# Patient Record
Sex: Male | Born: 1960 | Race: Black or African American | Hispanic: No | Marital: Single | Smoking: Former smoker
Health system: Southern US, Community
[De-identification: ages and names within clinical notes are randomized; demographics above are authoritative.]

## PROBLEM LIST (undated history)

## (undated) DIAGNOSIS — R112 Nausea with vomiting, unspecified: Secondary | ICD-10-CM

## (undated) DIAGNOSIS — IMO0001 Reserved for inherently not codable concepts without codable children: Secondary | ICD-10-CM

## (undated) DIAGNOSIS — I10 Essential (primary) hypertension: Secondary | ICD-10-CM

## (undated) DIAGNOSIS — K219 Gastro-esophageal reflux disease without esophagitis: Secondary | ICD-10-CM

## (undated) DIAGNOSIS — R569 Unspecified convulsions: Secondary | ICD-10-CM

## (undated) DIAGNOSIS — Z923 Personal history of irradiation: Secondary | ICD-10-CM

## (undated) DIAGNOSIS — R55 Syncope and collapse: Secondary | ICD-10-CM

## (undated) DIAGNOSIS — G4700 Insomnia, unspecified: Secondary | ICD-10-CM

## (undated) DIAGNOSIS — R5383 Other fatigue: Secondary | ICD-10-CM

## (undated) DIAGNOSIS — R12 Heartburn: Secondary | ICD-10-CM

## (undated) DIAGNOSIS — C140 Malignant neoplasm of pharynx, unspecified: Secondary | ICD-10-CM

## (undated) HISTORY — PX: TONSILLECTOMY AND ADENOIDECTOMY: SHX28

## (undated) HISTORY — PX: HERNIA REPAIR: SHX51

## (undated) HISTORY — DX: Heartburn: R12

## (undated) HISTORY — DX: Insomnia, unspecified: G47.00

## (undated) HISTORY — DX: Other fatigue: R53.83

## (undated) HISTORY — PX: TONSILLECTOMY: SUR1361

## (undated) HISTORY — DX: Syncope and collapse: R55

## (undated) HISTORY — DX: Malignant neoplasm of pharynx, unspecified: C14.0

## (undated) HISTORY — DX: Nausea with vomiting, unspecified: R11.2

---

## 1996-12-12 HISTORY — PX: KNEE SURGERY: SHX244

## 1999-01-07 ENCOUNTER — Encounter: Payer: Self-pay | Admitting: Emergency Medicine

## 1999-01-07 ENCOUNTER — Emergency Department (HOSPITAL_COMMUNITY): Admission: EM | Admit: 1999-01-07 | Discharge: 1999-01-07 | Payer: Self-pay | Admitting: Emergency Medicine

## 2009-05-27 ENCOUNTER — Ambulatory Visit: Payer: Self-pay | Admitting: Internal Medicine

## 2009-06-11 ENCOUNTER — Ambulatory Visit: Payer: Self-pay | Admitting: Internal Medicine

## 2009-06-30 ENCOUNTER — Ambulatory Visit: Payer: Self-pay | Admitting: Family Medicine

## 2009-07-06 ENCOUNTER — Ambulatory Visit: Payer: Self-pay | Admitting: Internal Medicine

## 2009-08-06 ENCOUNTER — Ambulatory Visit: Payer: Self-pay | Admitting: Internal Medicine

## 2009-09-01 ENCOUNTER — Ambulatory Visit: Payer: Self-pay | Admitting: Internal Medicine

## 2009-09-09 ENCOUNTER — Ambulatory Visit: Payer: Self-pay | Admitting: Internal Medicine

## 2009-12-16 ENCOUNTER — Ambulatory Visit: Payer: Self-pay | Admitting: Internal Medicine

## 2009-12-16 LAB — CONVERTED CEMR LAB
Albumin ELP: 54.8 % — ABNORMAL LOW (ref 55.8–66.1)
Alpha-1-Globulin: 4.3 % (ref 2.9–4.9)
Alpha-2-Globulin: 10.7 % (ref 7.1–11.8)
Anti Nuclear Antibody(ANA): NEGATIVE
Basophils Absolute: 0.1 10*3/uL (ref 0.0–0.1)
Basophils Relative: 1 % (ref 0–1)
Beta Globulin: 6.4 % (ref 4.7–7.2)
Eosinophils Absolute: 0.3 10*3/uL (ref 0.0–0.7)
Eosinophils Relative: 3 % (ref 0–5)
HCT: 41.7 % (ref 39.0–52.0)
HDL: 59 mg/dL (ref >39)
Hemoglobin: 14.1 g/dL (ref 13.0–17.0)
LDL Cholesterol: 68 mg/dL (ref 0–99)
Lymphocytes Relative: 20 % (ref 12–46)
MCHC: 33.8 g/dL (ref 30.0–36.0)
Monocytes Absolute: 0.8 10*3/uL (ref 0.1–1.0)
Monocytes Relative: 8 % (ref 3–12)
Platelets: 226 10*3/uL (ref 150–400)
RDW: 14.4 % (ref 11.5–15.5)
Sed Rate: 9 mm/hr (ref 0–16)
Total Protein, Serum Electrophoresis: 7.3 g/dL (ref 6.0–8.3)

## 2009-12-18 ENCOUNTER — Ambulatory Visit (HOSPITAL_COMMUNITY): Admission: RE | Admit: 2009-12-18 | Discharge: 2009-12-18 | Payer: Self-pay | Admitting: Internal Medicine

## 2009-12-29 ENCOUNTER — Ambulatory Visit: Payer: Self-pay | Admitting: Internal Medicine

## 2010-01-28 ENCOUNTER — Encounter (INDEPENDENT_AMBULATORY_CARE_PROVIDER_SITE_OTHER): Payer: Self-pay | Admitting: Family Medicine

## 2010-01-28 ENCOUNTER — Ambulatory Visit: Payer: Self-pay | Admitting: Internal Medicine

## 2010-06-01 ENCOUNTER — Ambulatory Visit: Payer: Self-pay | Admitting: Internal Medicine

## 2010-06-01 ENCOUNTER — Encounter (INDEPENDENT_AMBULATORY_CARE_PROVIDER_SITE_OTHER): Payer: Self-pay | Admitting: Family Medicine

## 2010-06-01 LAB — CONVERTED CEMR LAB
ALT: 8 units/L (ref 0–53)
AST: 15 units/L (ref 0–37)
Albumin: 4.5 g/dL (ref 3.5–5.2)
Alkaline Phosphatase: 48 units/L (ref 39–117)
BUN: 7 mg/dL (ref 6–23)
Basophils Absolute: 0.1 10*3/uL (ref 0.0–0.1)
Basophils Relative: 1 % (ref 0–1)
CO2: 25 meq/L (ref 19–32)
Calcium: 9.9 mg/dL (ref 8.4–10.5)
Chloride: 104 meq/L (ref 96–112)
Cholesterol: 145 mg/dL (ref 0–200)
Creatinine, Ser: 1.07 mg/dL (ref 0.40–1.50)
Eosinophils Absolute: 0.4 10*3/uL (ref 0.0–0.7)
Eosinophils Relative: 5 % (ref 0–5)
Glucose, Bld: 89 mg/dL (ref 70–99)
HCT: 42.7 % (ref 39.0–52.0)
HDL: 65 mg/dL (ref >39)
Hemoglobin: 14.3 g/dL (ref 13.0–17.0)
LDL Cholesterol: 59 mg/dL (ref 0–99)
Lymphocytes Relative: 23 % (ref 12–46)
Lymphs Abs: 1.8 10*3/uL (ref 0.7–4.0)
MCHC: 33.5 g/dL (ref 30.0–36.0)
MCV: 101.7 fL — ABNORMAL HIGH (ref 78.0–100.0)
Magnesium: 1.9 mg/dL (ref 1.5–2.5)
Monocytes Absolute: 0.8 10*3/uL (ref 0.1–1.0)
Monocytes Relative: 10 % (ref 3–12)
Neutro Abs: 4.8 10*3/uL (ref 1.7–7.7)
Neutrophils Relative %: 61 % (ref 43–77)
Platelets: 210 10*3/uL (ref 150–400)
Potassium: 4.9 meq/L (ref 3.5–5.3)
RBC: 4.2 M/uL — ABNORMAL LOW (ref 4.22–5.81)
RDW: 15.3 % (ref 11.5–15.5)
Sodium: 143 meq/L (ref 135–145)
TSH: 1.996 microintl units/mL (ref 0.350–4.500)
Total Bilirubin: 0.3 mg/dL (ref 0.3–1.2)
Total CHOL/HDL Ratio: 2.2
Total Protein: 7.8 g/dL (ref 6.0–8.3)
Triglycerides: 107 mg/dL (ref <150)
VLDL: 21 mg/dL (ref 0–40)
Vitamin B-12: >2000 pg/mL — ABNORMAL HIGH (ref 211–911)
WBC: 7.9 10*3/uL (ref 4.0–10.5)

## 2010-07-02 ENCOUNTER — Ambulatory Visit: Payer: Self-pay | Admitting: Internal Medicine

## 2010-07-09 ENCOUNTER — Ambulatory Visit (HOSPITAL_COMMUNITY): Admission: RE | Admit: 2010-07-09 | Discharge: 2010-07-09 | Payer: Self-pay | Admitting: Family Medicine

## 2011-01-02 ENCOUNTER — Encounter: Payer: Self-pay | Admitting: Family Medicine

## 2011-01-02 ENCOUNTER — Encounter: Payer: Self-pay | Admitting: Internal Medicine

## 2011-08-19 ENCOUNTER — Emergency Department (HOSPITAL_COMMUNITY)
Admission: EM | Admit: 2011-08-19 | Discharge: 2011-08-19 | Disposition: A | Discharge status: Home / Self Care | Payer: Self-pay | Attending: Emergency Medicine | Admitting: Emergency Medicine

## 2011-08-19 DIAGNOSIS — S91009A Unspecified open wound, unspecified ankle, initial encounter: Secondary | ICD-10-CM | POA: Insufficient documentation

## 2011-08-19 DIAGNOSIS — W1809XA Striking against other object with subsequent fall, initial encounter: Secondary | ICD-10-CM | POA: Insufficient documentation

## 2011-08-19 DIAGNOSIS — Y92009 Unspecified place in unspecified non-institutional (private) residence as the place of occurrence of the external cause: Secondary | ICD-10-CM | POA: Insufficient documentation

## 2011-08-19 DIAGNOSIS — L708 Other acne: Secondary | ICD-10-CM | POA: Insufficient documentation

## 2011-08-19 DIAGNOSIS — L723 Sebaceous cyst: Secondary | ICD-10-CM | POA: Insufficient documentation

## 2011-08-19 DIAGNOSIS — S81009A Unspecified open wound, unspecified knee, initial encounter: Secondary | ICD-10-CM | POA: Insufficient documentation

## 2014-07-21 ENCOUNTER — Emergency Department (INDEPENDENT_AMBULATORY_CARE_PROVIDER_SITE_OTHER)
Admission: EM | Admit: 2014-07-21 | Discharge: 2014-07-21 | Disposition: A | Discharge status: Home / Self Care | Payer: Self-pay | Source: Home / Self Care | Attending: Family Medicine | Admitting: Family Medicine

## 2014-07-21 ENCOUNTER — Encounter (HOSPITAL_COMMUNITY): Payer: Self-pay | Admitting: Emergency Medicine

## 2014-07-21 DIAGNOSIS — J029 Acute pharyngitis, unspecified: Secondary | ICD-10-CM

## 2014-07-21 NOTE — ED Provider Notes (Signed)
Medical screening examination/treatment/procedure(s) were performed by a resident physician or non-physician practitioner and as the supervising physician I was immediately available for consultation/collaboration.  Linna Darner, MD Family Medicine   Waldemar Dickens, MD 07/21/14 2138

## 2014-07-21 NOTE — Discharge Instructions (Signed)
As we discussed, I have concern that you may need special xrays of your neck to determine if you have a mass in your throat. If you wish to have these xrays done today, you will need to present yourself to be seen at the [facility], or [facility] facility. Otherwise, you can follow up with your primary care doctor to arrange for them to be performed.  Sore Throat A sore throat is pain, burning, irritation, or scratchiness of the throat. There is often pain or tenderness when swallowing or talking. A sore throat may be accompanied by other symptoms, such as coughing, sneezing, fever, and swollen neck glands. A sore throat is often the first sign of another sickness, such as a cold, flu, strep throat, or mononucleosis (commonly known as mono). Most sore throats go away without medical treatment. CAUSES  The most common causes of a sore throat include:  A viral infection, such as a cold, flu, or mono.  A bacterial infection, such as strep throat, tonsillitis, or whooping cough.  Seasonal allergies.  Dryness in the air.  Irritants, such as smoke or pollution.  Gastroesophageal reflux disease (GERD). HOME CARE INSTRUCTIONS   Only take over-the-counter medicines as directed by your caregiver.  Drink enough fluids to keep your urine clear or pale yellow.  Rest as needed.  Try using throat sprays, lozenges, or sucking on hard candy to ease any pain (if older than 4 years or as directed).  Sip warm liquids, such as broth, herbal tea, or warm water with honey to relieve pain temporarily. You may also eat or drink cold or frozen liquids such as frozen ice pops.  Gargle with salt water (mix 1 tsp salt with 8 oz of water).  Do not smoke and avoid secondhand smoke.  Put a cool-mist humidifier in your bedroom at night to moisten the air. You can also turn on a hot shower and sit in the bathroom with the door closed for 5-10 minutes. SEEK IMMEDIATE MEDICAL CARE  IF:  You have difficulty breathing.  You are unable to swallow fluids, soft foods, or your saliva.  You have increased swelling in the throat.  Your sore throat does not get better in 7 days.  You have nausea and vomiting.  You have a fever or persistent symptoms for more than 2-3 days.  You have a fever and your symptoms suddenly get worse. MAKE SURE YOU:   Understand these instructions.  Will watch your condition.  Will get help right away if you are not doing well or get worse. Document Released: 01/05/2005 Document Revised: 11/14/2012 Document Reviewed: 08/05/2012 [facility] Patient Information 2015 [place], [place]. This information is not intended to replace advice given to you by your health care provider. Make sure you discuss any questions you have with your health care provider.

## 2014-07-21 NOTE — ED Notes (Signed)
C/o sore throat for 4 weeks, reports burning at the base of throat.  Reports choking feeling when lying in bed

## 2014-07-21 NOTE — ED Provider Notes (Signed)
CSN: [id number]     Arrival date & time 07/21/14  14 History   First MD Initiated Contact with Patient 07/21/14 1119     Chief Complaint  Patient presents with  . Sore Throat   (Consider location/radiation/quality/duration/timing/severity/associated sxs/prior Treatment) HPI Comments: Patient reports he has been a heavy smoker since 1983 and used to be a patient at [facility] [place], but he has since let his certification lapse. Reports progressive discomfort with swallowing over past 4-6 weeks. States he has become nearly unable to comfortably swallow solids and can only swallow liquids. Area discomfort is at anterior base of neck. Reports a choking sensation when lying in supine position. Denies unexplained weight loss, fever, hemoptysis, hematemesis. He also endorses some hoarseness to his voice and tender swelling of his cervical lymph nodes.  Patient is a 53 y.o. male presenting with pharyngitis. The history is provided by the patient.  Sore Throat This is a chronic problem. Episode onset: sx began 5 weeks ago. Pertinent negatives include no shortness of breath.    History reviewed. No pertinent past medical history. History reviewed. No pertinent past surgical history. No family history on file. History  Substance Use Topics  . Smoking status: Current Every Day Smoker  . Smokeless tobacco: Not on file  . Alcohol Use: Yes    Review of Systems  Constitutional: Negative.   HENT: Positive for sore throat, trouble swallowing and voice change. Negative for congestion, drooling, ear pain, facial swelling, hearing loss, mouth sores, nosebleeds, postnasal drip, rhinorrhea, sinus pressure, sneezing and tinnitus.   Eyes: Negative.   Respiratory: Positive for choking. Negative for cough, chest tightness, shortness of breath, wheezing and stridor.   Cardiovascular: Negative.   Gastrointestinal: Negative.   Musculoskeletal: Negative.   Skin: Negative.     Allergies   Aspirin  Home Medications   Prior to Admission medications   Not on File   BP 142/95  Pulse 89  Temp(Src) 99.2 F (37.3 C) (Oral)  Resp 18  SpO2 99% Physical Exam  Nursing note and vitals reviewed. Constitutional: He is oriented to person, place, and time. He appears well-developed and well-nourished.  HENT:  Head: Normocephalic and atraumatic.  Right Ear: Hearing and external ear normal.  Left Ear: Hearing and external ear normal.  Nose: Nose normal.  Mouth/Throat: Uvula is midline and mucous membranes are normal. No oral lesions. No trismus in the jaw. No uvula swelling. No oropharyngeal exudate, posterior oropharyngeal edema, posterior oropharyngeal erythema or tonsillar abscesses.  Eyes: Conjunctivae are normal. No scleral icterus.  Neck: Normal range of motion. Neck supple. No tracheal deviation present.    Cardiovascular: Normal rate, regular rhythm and normal heart sounds.   Pulmonary/Chest: Effort normal and breath sounds normal. No stridor. No respiratory distress. He has no wheezes.  Musculoskeletal: Normal range of motion.  Lymphadenopathy:    He has cervical adenopathy.  Neurological: He is alert and oriented to person, place, and time.  Skin: Skin is warm and dry. No rash noted. No erythema.  Psychiatric: He has a normal mood and affect. His behavior is normal.    ED Course  Procedures (including critical care time) Labs Review Labs Reviewed - No data to display  Imaging Review No results found.   MDM   1. Sore throat    No clinical evidence of need for urgent airway maintenance. Patient speaking in full sentences and handling his own secretions without difficulty Explained to patient that my first concern was the possibility of head/neck mass  and I suggested to patient that we transfer him to [facility] ER for head/neck imaging as he does not have PCP or health insurance and outpatient imaging would be nearly impossible to arrange. He expressed that  the possibility throat cancer was also his main concern. He did, however, explain to me that he had borrowed a friend's car to come to clinic today and he felt he needed to return car before being seen in ER. He explained to me that he would like to return car and then present to the ER for evaluation. Provided patient with instructions regarding my concerns and instructions for return.     Lutricia Feil, [place] 07/21/14 1254

## 2015-03-15 ENCOUNTER — Emergency Department (HOSPITAL_COMMUNITY): Payer: Medicaid Other

## 2015-03-15 ENCOUNTER — Encounter (HOSPITAL_COMMUNITY): Payer: Self-pay | Admitting: Emergency Medicine

## 2015-03-15 ENCOUNTER — Inpatient Hospital Stay (HOSPITAL_COMMUNITY)
Admission: EM | Admit: 2015-03-15 | Discharge: 2015-03-17 | DRG: 133 | Disposition: A | Discharge status: Home / Self Care | Payer: Medicaid Other | Attending: Internal Medicine | Admitting: Internal Medicine

## 2015-03-15 DIAGNOSIS — R569 Unspecified convulsions: Secondary | ICD-10-CM

## 2015-03-15 DIAGNOSIS — C770 Secondary and unspecified malignant neoplasm of lymph nodes of head, face and neck: Secondary | ICD-10-CM | POA: Diagnosis not present

## 2015-03-15 DIAGNOSIS — C139 Malignant neoplasm of hypopharynx, unspecified: Secondary | ICD-10-CM | POA: Diagnosis present

## 2015-03-15 DIAGNOSIS — E559 Vitamin D deficiency, unspecified: Secondary | ICD-10-CM | POA: Diagnosis present

## 2015-03-15 DIAGNOSIS — F17210 Nicotine dependence, cigarettes, uncomplicated: Secondary | ICD-10-CM | POA: Diagnosis present

## 2015-03-15 DIAGNOSIS — D649 Anemia, unspecified: Secondary | ICD-10-CM | POA: Diagnosis present

## 2015-03-15 DIAGNOSIS — R079 Chest pain, unspecified: Secondary | ICD-10-CM

## 2015-03-15 DIAGNOSIS — R1310 Dysphagia, unspecified: Secondary | ICD-10-CM | POA: Diagnosis present

## 2015-03-15 DIAGNOSIS — Z720 Tobacco use: Secondary | ICD-10-CM | POA: Diagnosis present

## 2015-03-15 DIAGNOSIS — F1020 Alcohol dependence, uncomplicated: Secondary | ICD-10-CM | POA: Diagnosis present

## 2015-03-15 DIAGNOSIS — E538 Deficiency of other specified B group vitamins: Secondary | ICD-10-CM | POA: Diagnosis present

## 2015-03-15 DIAGNOSIS — F1010 Alcohol abuse, uncomplicated: Secondary | ICD-10-CM | POA: Diagnosis not present

## 2015-03-15 DIAGNOSIS — I77810 Thoracic aortic ectasia: Secondary | ICD-10-CM | POA: Diagnosis present

## 2015-03-15 DIAGNOSIS — E43 Unspecified severe protein-calorie malnutrition: Secondary | ICD-10-CM | POA: Diagnosis present

## 2015-03-15 DIAGNOSIS — I10 Essential (primary) hypertension: Secondary | ICD-10-CM | POA: Diagnosis present

## 2015-03-15 DIAGNOSIS — I77819 Aortic ectasia, unspecified site: Secondary | ICD-10-CM | POA: Diagnosis present

## 2015-03-15 DIAGNOSIS — K219 Gastro-esophageal reflux disease without esophagitis: Secondary | ICD-10-CM | POA: Diagnosis present

## 2015-03-15 DIAGNOSIS — C779 Secondary and unspecified malignant neoplasm of lymph node, unspecified: Secondary | ICD-10-CM | POA: Diagnosis present

## 2015-03-15 DIAGNOSIS — Z515 Encounter for palliative care: Secondary | ICD-10-CM | POA: Diagnosis not present

## 2015-03-15 DIAGNOSIS — C760 Malignant neoplasm of head, face and neck: Secondary | ICD-10-CM

## 2015-03-15 DIAGNOSIS — E876 Hypokalemia: Secondary | ICD-10-CM | POA: Diagnosis present

## 2015-03-15 DIAGNOSIS — F1210 Cannabis abuse, uncomplicated: Secondary | ICD-10-CM | POA: Diagnosis present

## 2015-03-15 DIAGNOSIS — Z681 Body mass index (BMI) 19 or less, adult: Secondary | ICD-10-CM | POA: Diagnosis not present

## 2015-03-15 DIAGNOSIS — R221 Localized swelling, mass and lump, neck: Secondary | ICD-10-CM | POA: Diagnosis not present

## 2015-03-15 DIAGNOSIS — R9431 Abnormal electrocardiogram [ECG] [EKG]: Secondary | ICD-10-CM | POA: Diagnosis present

## 2015-03-15 DIAGNOSIS — I2699 Other pulmonary embolism without acute cor pulmonale: Secondary | ICD-10-CM

## 2015-03-15 DIAGNOSIS — Z79899 Other long term (current) drug therapy: Secondary | ICD-10-CM

## 2015-03-15 DIAGNOSIS — E041 Nontoxic single thyroid nodule: Secondary | ICD-10-CM | POA: Diagnosis present

## 2015-03-15 DIAGNOSIS — E46 Unspecified protein-calorie malnutrition: Secondary | ICD-10-CM | POA: Diagnosis not present

## 2015-03-15 DIAGNOSIS — F10239 Alcohol dependence with withdrawal, unspecified: Secondary | ICD-10-CM | POA: Diagnosis not present

## 2015-03-15 DIAGNOSIS — C140 Malignant neoplasm of pharynx, unspecified: Secondary | ICD-10-CM

## 2015-03-15 DIAGNOSIS — R591 Generalized enlarged lymph nodes: Secondary | ICD-10-CM | POA: Diagnosis present

## 2015-03-15 DIAGNOSIS — I4581 Long QT syndrome: Secondary | ICD-10-CM

## 2015-03-15 HISTORY — DX: Essential (primary) hypertension: I10

## 2015-03-15 LAB — URINALYSIS, ROUTINE W REFLEX MICROSCOPIC
Glucose, UA: NEGATIVE mg/dL
HGB URINE DIPSTICK: NEGATIVE
Ketones, ur: 40 mg/dL — AB
LEUKOCYTES UA: NEGATIVE
Nitrite: NEGATIVE
Protein, ur: 30 mg/dL — AB
Urobilinogen, UA: 2 mg/dL — ABNORMAL HIGH (ref 0.0–1.0)
pH: 5.5 (ref 5.0–8.0)

## 2015-03-15 LAB — I-STAT CHEM 8, ED
BUN: 11 mg/dL (ref 6–23)
CALCIUM ION: 1.25 mmol/L — AB (ref 1.12–1.23)
CHLORIDE: 91 mmol/L — AB (ref 96–112)
Creatinine, Ser: 0.8 mg/dL (ref 0.50–1.35)
Glucose, Bld: 101 mg/dL — ABNORMAL HIGH (ref 70–99)
HCT: 50 % (ref 39.0–52.0)
HEMOGLOBIN: 17 g/dL (ref 13.0–17.0)
Potassium: 3 mmol/L — ABNORMAL LOW (ref 3.5–5.1)
Sodium: 135 mmol/L (ref 135–145)
TCO2: 31 mmol/L (ref 0–100)

## 2015-03-15 LAB — CBC WITH DIFFERENTIAL/PLATELET
BASOS ABS: 0 10*3/uL (ref 0.0–0.1)
BASOS PCT: 0 % (ref 0–1)
EOS ABS: 0.1 10*3/uL (ref 0.0–0.7)
Eosinophils Relative: 1 % (ref 0–5)
HEMATOCRIT: 41.8 % (ref 39.0–52.0)
HEMOGLOBIN: 14.6 g/dL (ref 13.0–17.0)
Lymphocytes Relative: 13 % (ref 12–46)
Lymphs Abs: 1.6 10*3/uL (ref 0.7–4.0)
MCH: 33.5 pg (ref 26.0–34.0)
MCHC: 34.9 g/dL (ref 30.0–36.0)
MCV: 95.9 fL (ref 78.0–100.0)
MONO ABS: 0.8 10*3/uL (ref 0.1–1.0)
Monocytes Relative: 6 % (ref 3–12)
NEUTROS ABS: 10 10*3/uL — AB (ref 1.7–7.7)
NEUTROS PCT: 80 % — AB (ref 43–77)
Platelets: 200 10*3/uL (ref 150–400)
RBC: 4.36 MIL/uL (ref 4.22–5.81)
RDW: 14.7 % (ref 11.5–15.5)
WBC: 12.5 10*3/uL — AB (ref 4.0–10.5)

## 2015-03-15 LAB — RAPID URINE DRUG SCREEN, HOSP PERFORMED
Amphetamines: NOT DETECTED
BENZODIAZEPINES: NOT DETECTED
Barbiturates: NOT DETECTED
Cocaine: NOT DETECTED
Opiates: NOT DETECTED
TETRAHYDROCANNABINOL: POSITIVE — AB

## 2015-03-15 LAB — URINE MICROSCOPIC-ADD ON

## 2015-03-15 LAB — COMPREHENSIVE METABOLIC PANEL
ALT: 15 U/L (ref 0–53)
ANION GAP: 10 (ref 5–15)
AST: 26 U/L (ref 0–37)
Albumin: 3.4 g/dL — ABNORMAL LOW (ref 3.5–5.2)
Alkaline Phosphatase: 76 U/L (ref 39–117)
BUN: 10 mg/dL (ref 6–23)
CO2: 32 mmol/L (ref 19–32)
Calcium: 10 mg/dL (ref 8.4–10.5)
Chloride: 92 mmol/L — ABNORMAL LOW (ref 96–112)
Creatinine, Ser: 0.75 mg/dL (ref 0.50–1.35)
GFR calc non Af Amer: >90 mL/min (ref >90)
GLUCOSE: 100 mg/dL — AB (ref 70–99)
POTASSIUM: 3.5 mmol/L (ref 3.5–5.1)
Sodium: 134 mmol/L — ABNORMAL LOW (ref 135–145)
TOTAL PROTEIN: 8.5 g/dL — AB (ref 6.0–8.3)
Total Bilirubin: 0.6 mg/dL (ref 0.3–1.2)

## 2015-03-15 LAB — PHOSPHORUS: PHOSPHORUS: 3.5 mg/dL (ref 2.3–4.6)

## 2015-03-15 LAB — TSH: TSH: 1.111 u[IU]/mL (ref 0.350–4.500)

## 2015-03-15 LAB — PROTIME-INR
INR: 1.08 (ref 0.00–1.49)
Prothrombin Time: 14.2 seconds (ref 11.6–15.2)

## 2015-03-15 LAB — I-STAT CG4 LACTIC ACID, ED: LACTIC ACID, VENOUS: 1.82 mmol/L (ref 0.5–2.0)

## 2015-03-15 LAB — MRSA PCR SCREENING: MRSA by PCR: NEGATIVE

## 2015-03-15 LAB — APTT: APTT: 34 s (ref 24–37)

## 2015-03-15 LAB — ETHANOL: Alcohol, Ethyl (B): <5 mg/dL (ref 0–9)

## 2015-03-15 LAB — MAGNESIUM: Magnesium: 1.6 mg/dL (ref 1.5–2.5)

## 2015-03-15 MED ORDER — POTASSIUM CHLORIDE 10 MEQ/100ML IV SOLN: 10.0000 meq | INTRAVENOUS | Status: DC

## 2015-03-15 MED ORDER — LIDOCAINE HCL (CARDIAC) 20 MG/ML IV SOLN
INTRAVENOUS | Status: AC
  Filled 2015-03-15: qty 5

## 2015-03-15 MED ORDER — KCL IN DEXTROSE-NACL 20-5-0.9 MEQ/L-%-% IV SOLN
INTRAVENOUS | Status: DC
  Administered 2015-03-16: 01:00:00 via INTRAVENOUS
  Filled 2015-03-15 (×2): qty 1000

## 2015-03-15 MED ORDER — THIAMINE HCL 100 MG/ML IJ SOLN: 100.0000 mg | Freq: Every day | INTRAMUSCULAR | Status: DC

## 2015-03-15 MED ORDER — SODIUM CHLORIDE (HYPERTONIC) 2 % OP SOLN
1.0000 [drp] | Freq: Once | OPHTHALMIC | Status: AC
  Administered 2015-03-15: 1 [drp] via OPHTHALMIC
  Filled 2015-03-15: qty 15

## 2015-03-15 MED ORDER — POLYETHYLENE GLYCOL 3350 17 G PO PACK
17.0000 g | PACK | Freq: Every day | ORAL | Status: DC | PRN
  Filled 2015-03-15: qty 1

## 2015-03-15 MED ORDER — LIDOCAINE VISCOUS 2 % MT SOLN
15.0000 mL | Freq: Once | OROMUCOSAL | Status: AC
  Administered 2015-03-15: 15 mL via OROMUCOSAL
  Filled 2015-03-15: qty 15

## 2015-03-15 MED ORDER — LORAZEPAM 2 MG/ML IJ SOLN
0.0000 mg | Freq: Two times a day (BID) | INTRAMUSCULAR | Status: DC
Start: 2015-03-17 — End: 2015-03-17

## 2015-03-15 MED ORDER — NICOTINE 21 MG/24HR TD PT24
21.0000 mg | MEDICATED_PATCH | Freq: Once | TRANSDERMAL | Status: AC
  Administered 2015-03-15: 21 mg via TRANSDERMAL
  Filled 2015-03-15 (×2): qty 1

## 2015-03-15 MED ORDER — FLUTICASONE PROPIONATE 50 MCG/ACT NA SUSP
1.0000 | Freq: Once | NASAL | Status: AC
  Administered 2015-03-15: 1 via NASAL
  Filled 2015-03-15: qty 16

## 2015-03-15 MED ORDER — NICOTINE 21 MG/24HR TD PT24
21.0000 mg | MEDICATED_PATCH | Freq: Every day | TRANSDERMAL | Status: DC
  Administered 2015-03-16 – 2015-03-17 (×2): 21 mg via TRANSDERMAL
  Filled 2015-03-15 (×2): qty 1

## 2015-03-15 MED ORDER — ACETAMINOPHEN 650 MG RE SUPP: 650.0000 mg | Freq: Four times a day (QID) | RECTAL | Status: DC | PRN

## 2015-03-15 MED ORDER — LORAZEPAM 1 MG PO TABS: 1.0000 mg | ORAL_TABLET | Freq: Four times a day (QID) | ORAL | Status: DC | PRN

## 2015-03-15 MED ORDER — LORAZEPAM 2 MG/ML IJ SOLN
1.0000 mg | Freq: Four times a day (QID) | INTRAMUSCULAR | Status: DC | PRN
  Administered 2015-03-16: 1 mg via INTRAVENOUS
  Filled 2015-03-15: qty 1

## 2015-03-15 MED ORDER — ENOXAPARIN SODIUM 40 MG/0.4ML ~~LOC~~ SOLN
40.0000 mg | SUBCUTANEOUS | Status: DC
  Administered 2015-03-15 – 2015-03-17 (×2): 40 mg via SUBCUTANEOUS
  Filled 2015-03-15 (×2): qty 0.4

## 2015-03-15 MED ORDER — KCL IN DEXTROSE-NACL 20-5-0.9 MEQ/L-%-% IV SOLN
INTRAVENOUS | Status: AC
  Administered 2015-03-15: 14:00:00 via INTRAVENOUS
  Filled 2015-03-15 (×2): qty 1000

## 2015-03-15 MED ORDER — SODIUM CHLORIDE 0.9 % IV BOLUS (SEPSIS)
1000.0000 mL | Freq: Once | INTRAVENOUS | Status: AC
  Administered 2015-03-15: 1000 mL via INTRAVENOUS

## 2015-03-15 MED ORDER — LORAZEPAM 2 MG/ML IJ SOLN: 0.0000 mg | Freq: Four times a day (QID) | INTRAMUSCULAR | Status: AC

## 2015-03-15 MED ORDER — FOLIC ACID 1 MG PO TABS
1.0000 mg | ORAL_TABLET | Freq: Every day | ORAL | Status: DC
  Administered 2015-03-15 – 2015-03-16 (×2): 1 mg via ORAL
  Filled 2015-03-15 (×2): qty 1

## 2015-03-15 MED ORDER — IOHEXOL 300 MG/ML  SOLN
125.0000 mL | Freq: Once | INTRAMUSCULAR | Status: AC | PRN
  Administered 2015-03-15: 125 mL via INTRAVENOUS

## 2015-03-15 MED ORDER — VITAMIN B-1 100 MG PO TABS
100.0000 mg | ORAL_TABLET | Freq: Every day | ORAL | Status: DC
  Administered 2015-03-15 – 2015-03-16 (×2): 100 mg via ORAL
  Filled 2015-03-15: qty 1

## 2015-03-15 MED ORDER — ACETAMINOPHEN 325 MG PO TABS
650.0000 mg | ORAL_TABLET | Freq: Four times a day (QID) | ORAL | Status: DC | PRN
  Administered 2015-03-16: 650 mg via ORAL
  Filled 2015-03-15: qty 2

## 2015-03-15 MED ORDER — FLUTICASONE PROPIONATE 50 MCG/ACT NA SUSP: 1.0000 | Freq: Every day | NASAL | Status: DC

## 2015-03-15 MED ORDER — ALBUTEROL SULFATE (2.5 MG/3ML) 0.083% IN NEBU
5.0000 mg | INHALATION_SOLUTION | RESPIRATORY_TRACT | Status: DC | PRN
  Administered 2015-03-15 – 2015-03-16 (×2): 5 mg via RESPIRATORY_TRACT
  Filled 2015-03-15 (×2): qty 6

## 2015-03-15 MED ORDER — DEXTROSE-NACL 5-0.45 % IV SOLN: INTRAVENOUS | Status: DC

## 2015-03-15 MED ORDER — ADULT MULTIVITAMIN W/MINERALS CH
1.0000 | ORAL_TABLET | Freq: Every day | ORAL | Status: DC
  Administered 2015-03-15 – 2015-03-16 (×2): 1 via ORAL
  Filled 2015-03-15 (×2): qty 1

## 2015-03-15 NOTE — ED Notes (Signed)
3rd attempt at report.

## 2015-03-15 NOTE — H&P (Signed)
Date: 03/15/2015               Patient Name:  Kyle Alexander MRN: [id number]  DOB: 01/01/1961 Age / Sex: 54 y.o., male   PCP: No Pcp Per Patient         Medical Service: Internal Medicine Teaching Service         Attending Physician: Dr. Bertha Stakes, MD    First Contact: Kyle Alexander, MS4` Pager: [phone]  Second Contact: Kyle Alexander Pager: [phone]       After Hours (After 5p/  First Contact Pager: [phone]  weekends / holidays): Second Contact Pager: [phone]   Chief Complaint: Neck mass  History of Present Illness: Patient is a 54 year old man with history of untreated hypertension and alcohol and tobacco use presenting with worsening dysphagia, SOB, hemoptosis, hoarseness, 30-40 pound weight loss and worsening bilateral neck masses for the past 5 months.   The patient endorses dysphagia since August and a discernible lump, first on the left then on the right, for the past 3 months. He was seen in an urgent care on 07/21/2014 complaining of 4 weeks of sore throat, burning sensation, hoarseness, tender cervical LAD, and choking sensation while lying flat. At that time he denied weight loss, fever, or hemoptysis. At that time, he expressed concern regarding possible malignancy and was told he should be transferred to the ER. However, he left AMA and did not follow-up further.   The patient reports progressive symptoms over the past 7 months, with a "muffled" voice, increasing lethargy, and sensation of "getting choked up." The choking sensation is worse when lying down, to the extent that he now sleeps on 3 pillows. Reports he is able to sleep better on is left but even then struggles to breathe and will often wake up choking on his own secretions with excessive evening drooling. The patient reports coughing up bright red blood for the past few weeks, which he believes may measure more than a tablespoon.   In terms of the neck masses, he says that until today they were tender,  especially on the left. He endorses steady worsening vision, bilateral ear pain and some jaw pain on the right. He pulled an upper right molar last week that was "rotten." He also endorses thoracic back pain. He denies any history of radiation exposure.   He describes the dysphagia as progressing to the extent that he can no longer comfortably swallow his own secretions. He denies odynophagia. However, he does note a persistent burning sensation in his throat for the past few days - he denies the burning sensation at time of this history, but reports it is otherwise present daily, not exacerbated by food or drink or position with no relieving interventions identified.   The patient reports a 30-40 pound weight loss over the last few months, with a baseline weight of 160-165 pounds. He attributes the weight loss to poor intake 2/2 dysphagia, describing decreased oral intake with poor diet more severe over the past few weeks. He was able to eat [name] last night but was only able to swallow a few bites. Denies any nausea or vomiting or symptoms of GERD, though he endorses a history of reflux for which he has used Tums in the past. He denies night sweats but has noted chills over the past few days.   He notes decreased fluid intake as well and endorses a syncopal episode on 3/31 after standing from seated position. He reports  worsening nonexertional SOB over the last 2 weeks as well as diffuse weakness and bilateral LE numbness, but the numbness is longstanding.   Patient has extensive tobacco use history, 1 PPD for 35 years. He says he has started smoking more (2 PPD) for the past month with his decreased oral intake. He has remote history of chewing tobacco. He also endorses history of alcohol abuse. Currently reports drinking 3-5 drinks (beer or liquor) daily, but has history of higher volume intake. His last drink was 10pm the night prior to admission. He reports that the alcohol soothes the burning  sensation in his throat. He also endorses a history of alcohol withdrawal including tremors and at least one seizure 5-6 years ago. He was previously told that he had low B12 and continues to experience cramping in his legs.   Patient endorses poor engagement in care 2/2 his not having health insurance.    Meds: Current Facility-Administered Medications  Medication Dose Route Frequency Provider Last Rate Last Dose  . acetaminophen (TYLENOL) tablet 650 mg  650 mg Oral Q6H PRN Juluis Mire, MD       Or  . acetaminophen (TYLENOL) suppository 650 mg  650 mg Rectal Q6H PRN Marjan Rabbani, MD      . dextrose 5 % and 0.9 % NaCl with KCl 20 mEq/L infusion   Intravenous Continuous Marjan Rabbani, MD      . enoxaparin (LOVENOX) injection 40 mg  40 mg Subcutaneous Q24H Marjan Rabbani, MD      . folic acid (FOLVITE) tablet 1 mg  1 mg Oral Daily Marjan Rabbani, MD      . LORazepam (ATIVAN) injection 0-4 mg  0-4 mg Intravenous Q6H Marjan Rabbani, MD       Followed by  . [START ON 03/17/2015] LORazepam (ATIVAN) injection 0-4 mg  0-4 mg Intravenous Q12H Marjan Rabbani, MD      . LORazepam (ATIVAN) tablet 1 mg  1 mg Oral Q6H PRN Juluis Mire, MD       Or  . LORazepam (ATIVAN) injection 1 mg  1 mg Intravenous Q6H PRN Juluis Mire, MD      . multivitamin with minerals tablet 1 tablet  1 tablet Oral Daily Marjan Rabbani, MD      . nicotine (NICODERM CQ - dosed in mg/24 hours) patch 21 mg  21 mg Transdermal Once Blanchie Dessert, MD   21 mg at 03/15/15 0835  . thiamine (VITAMIN B-1) tablet 100 mg  100 mg Oral Daily Marjan Rabbani, MD       Or  . thiamine (B-1) injection 100 mg  100 mg Intravenous Daily Juluis Mire, MD       No current outpatient prescriptions on file.    Allergies: Allergies as of 03/15/2015 - Review Complete 03/15/2015  Allergen Reaction Noted  . Aspirin  07/21/2014   Past Medical History  Diagnosis Date  . Hypertension    No past surgical history on file. History reviewed.  No pertinent family history. History   Social History  . Marital Status: Single    Spouse Name: N/A  . Number of Children: N/A  . Years of Education: N/A   Occupational History  . Not on file.   Social History Main Topics  . Smoking status: Current Every Day Smoker -- 1.00 packs/day for 35 years  . Smokeless tobacco: Former Systems developer  . Alcohol Use: 18.0 oz/week    30 Standard drinks or equivalent per week  . Drug Use: No  . Sexual  Activity: Not Currently   Other Topics Concern  . Not on file   Social History Narrative   Lives alone, unemployed. Previously worked in Architect.    Review of Systems: Constitutional: positive for chills, malaise and weight loss Ears, nose, mouth, throat, and face: positive for earaches, hoarseness, sore throat and voice change Respiratory: positive for cough and wheezing Cardiovascular: negative Gastrointestinal: positive for constipation Genitourinary:positive for decreased stream and hesitancy Musculoskeletal:positive for back pain, muscle weakness and myalgias  Physical Exam: Blood pressure 142/86, pulse 91, temperature 98 F (36.7 C), temperature source Oral, resp. rate 13, height 5' 10.5" (1.791 m), SpO2 100 %. General appearance: alert, cooperative, cachectic and no distress Head: Normocephalic, without obvious abnormality, atraumatic Throat: abnormal findings: dentition: poor and possible mass protrusion on right pharynx Neck: bilateral nonmobile, nontender neck masses Back: negative, no spinal point tenderness Lungs: clear to auscultation bilaterally Heart: regular rate and rhythm, S1, S2 normal, no murmur, click, rub or gallop Abdomen: soft, non-tender; bowel sounds normal; no masses,  no organomegaly Extremities: extremities normal, atraumatic, no cyanosis or edema Skin: Abrasion on extensor surface of left elbow Lymph nodes: shotty cervical adenopathy: no supraclavicular nodes Neurologic: Cranial nerves: normal Sensory:  reduced tactile sense b/l lower extremity Motor: grossly normal  Lab results: Basic Metabolic Panel:  Recent Labs  03/15/15 0740 03/15/15 0752  NA 134* 135  K 3.5 3.0*  CL 92* 91*  CO2 32  --   GLUCOSE 100* 101*  BUN 10 11  CREATININE 0.75 0.80  CALCIUM 10.0  --    Liver Function Tests:  Recent Labs  03/15/15 0740  AST 26  ALT 15  ALKPHOS 76  BILITOT 0.6  PROT 8.5*  ALBUMIN 3.4*   No results for input(s): LIPASE, AMYLASE in the last 72 hours. No results for input(s): AMMONIA in the last 72 hours. CBC:  Recent Labs  03/15/15 0740 03/15/15 0752  WBC 12.5*  --   NEUTROABS 10.0*  --   HGB 14.6 17.0  HCT 41.8 50.0  MCV 95.9  --   PLT 200  --     Imaging results:  Dg Chest 2 View  03/15/2015   CLINICAL DATA:  Chest pain, cough  EXAM: CHEST  2 VIEW  COMPARISON:  None.  FINDINGS: The heart size and mediastinal contours are within normal limits. Both lungs are clear. The visualized skeletal structures are unremarkable.  IMPRESSION: No active cardiopulmonary disease.   Electronically Signed   By: Inez Catalina M.D.   On: 03/15/2015 08:14   Ct Soft Tissue Neck W Contrast  03/15/2015   CLINICAL DATA:  Weight loss. Neck mass. Swelling. Short of breath.  EXAM: CT NECK WITH CONTRAST  TECHNIQUE: Multidetector CT imaging of the neck was performed using the standard protocol following the bolus administration of intravenous contrast.  CONTRAST:  16mL OMNIPAQUE IOHEXOL 300 MG/ML  SOLN  COMPARISON:  None.  FINDINGS: I think there is an extensive circumferential malignancy in the hypo pharyngeal/supraglottic region. This is difficult to measure precisely because of a superficial spreading pattern. The most bulky tumor is in the right supraglottic region.  There are multiple enlarged necrotic lymph nodes consistent with metastatic involvement. This includes retropharyngeal and parapharyngeal nodes, level 2 nodes bilaterally, level 3 and left-sided level for lymph nodes. The largest nodal  mass is a a right level 2 conglomeration measuring 5.2 x 3.8 x 3.5 cm in diameter.  Parotid glands are normal. Submandibular glands are normal. Thyroid gland contains a 13 mm  cyst or nodule in the left lobe. Carotid arteries and jugular veins are patent, though of both jugular veins show extrinsic compression by large nodal masses.  No significant bony finding.  IMPRESSION: I think there is an advanced hypopharyngeal/ supraglottic mass, largely with a superficial spreading pattern. Massive bilateral necrotic metastatic lymphadenopathy.  I did consider other possible etiologies such as tuberculosis, but think that is less likely.   Electronically Signed   By: Nelson Chimes M.D.   On: 03/15/2015 09:11   Ct Angio Chest Pe W/cm &/or Wo Cm  03/15/2015   CLINICAL DATA:  Weight loss with hemoptysis  EXAM: CT ANGIOGRAPHY CHEST WITH CONTRAST  TECHNIQUE: Multidetector CT imaging of the chest was performed using the standard protocol during bolus administration of intravenous contrast. Multiplanar CT image reconstructions and MIPs were obtained to evaluate the vascular anatomy.  CONTRAST:  176mL OMNIPAQUE IOHEXOL 300 MG/ML  SOLN  COMPARISON:  None.  FINDINGS: The lungs are well aerated bilaterally without focal infiltrate or sizable effusion. No parenchymal mass lesion is seen. The thoracic aorta and its branches are within normal limits. Prominence of the ascending aorta to 4 cm is noted. No dissection is identified. Normal tapering is noted distally. The pulmonary artery is within normal limits without evidence of filling defect to suggest pulmonary embolism. No significant hilar or mediastinal adenopathy is noted.  The thoracic inlet shows evidence of lymphadenopathy similar to that seen on the recent CT of the neck. The dominant lymph node is noted adjacent to the left common carotid artery best seen on image number 31 of series 406. It measures 2.9 x 2.0 cm in dimension.  The visualized upper abdomen shows evidence of  cholelithiasis. Area decreased attenuation is noted within the liver adjacent to the falciform ligament consistent with focal fatty infiltration. No acute bony abnormality is noted.  Review of the MIP images confirms the above findings.  IMPRESSION: No evidence of pulmonary embolism.  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 Recommend annual imaging followup by CTA or MRA. This recommendation follows 2010 ACCF/AHA/AATS/ACR/ASA/SCA/SCAI/SIR/STS/SVM Guidelines for the Diagnosis and Management of Patients with Thoracic Aortic Disease. Circulation. 2010; 121: A128-N867  Left supraclavicular adenopathy.  Cholelithiasis.   Electronically Signed   By: Inez Catalina M.D.   On: 03/15/2015 09:18    Other results: EKG: normal sinus rhythm, prolonged QT interval.  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   Neck mass: Patient has 7+ month history of worsening dysphagia, hoarseness, weight loss, recent-onset hemoptosis, and b/l neck masses in setting of tobacco abuse. Patient cachetic on exam. Neck CT significant for extensive circumferential mass in the hypopharyngeal/supraglottic region and multiple enlarged necrotic lymph nodes consistent with metastatic involvement. Largest nodal mass is at right level 2 conglomeration measuring 5.2 x 3.8 x 3.5 Parotid glands are normal. Submandibular glands are normal. Thyroid gland contains a 13 mm cyst or nodule in the left lobe. Carotid arteries and jugular veins are patent, though of both jugular veins show extrinsic compression by  large nodal masses. Also complaining of thoracic back  pain possibly 2/2 malignant disease, though no spinal tenderness appreciated on exam and no bony involvement appreciated on CT neck. Bilateral ear pain likely 2/2 mass effect vs dental carries given extremely poor dentition and patient's reported history of self-inflicted tooth extraction on right. Patient believes the process is likely malignant. Diffuse cervical adenopathy noted on physical exam and left supraclavicular adenopathy reported on CT chest, though not appreciated on examination. Spoke to oncologist on call and requested our re-consulting after ENT evaluates and completes biopsy. 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 Leukoctyosis (12.5) could be paraneoplastic although less likely in setting of normal platelets.   - c/s social work for [employer] - c/s ENT for possible mass biopsy - D5 NS @ 100cc/hour x10 hours for fluid resuscitation in setting of h/o poor intake and current NPO status - reconsult oncology pending ENT eval  HTN: Patient reports having been told he has elevated BP previously but has not followed up. Elevated BP with SBP in 150s on admission, high 159/88.  - continue to monitor VS  Tobacco abuse: Patient reports 35 year pack-year history, smoking 1PPD increased to 2PPD in recent weeks. Also endorses chewing tobacco for 10 years as adolescent. Tobacco use indicates neck mass may be 2/2 squamous cell carcinoma. Nl CXR, no evidence of intraparenchymal process/malignancy.  - nicotine patch  Alcohol abuse: Patient reports 3 (or more) drinks daily. Last drink was 4/2 @10pm . Has history of alcohol withdrawal symptoms including tremors and a witnessed seizure (per sister) 5-6 years ago. Albumin (3.4) and potassium (3.0) low   potentially indicating malnutrition in setting of excessive alcohol intake. Reports  h/o low B12 possibly associated with neuropathy for which he received injections 4-6 years ago.  - follow-up prealbumin - replete potassium -CIWA protocol - follow-up B12/folate level, replete as necessary  Prolonged QT: likely 2/2 hypokalemia, patient not taking any drugs at home associated with prolonged QT.  -replete potassium - follow-up Mg/Phos  Aortic dilation: Ascending aortic dilation to 4cm noted on CT chest.  - follow-up annual CTA or MRA, per guidelines.  Diet: NPO in anticipation of procedure/biopsy DVT Ppx: Lovenox 40mg  Code: Full  Dispo: Disposition is deferred at this time, awaiting improvement of current medical problems. Anticipated discharge in approximately 2 day(s).   The patient does not have a current PCP (No Pcp Per Patient) and does need an [facility] hospital follow-up appointment after discharge.  The patient does not have transportation limitations that hinder transportation to clinic appointments.  Signed: Carolan Shiver, Med Student 03/15/2015, 10:56 AM

## 2015-03-15 NOTE — ED Notes (Signed)
attempted report 

## 2015-03-15 NOTE — ED Notes (Signed)
Pt reports loss of weight; difficulty talking; throat swelling; spitting up blood. SOB.

## 2015-03-15 NOTE — ED Notes (Signed)
Patient transported to X-ray without distress.  

## 2015-03-15 NOTE — ED Provider Notes (Signed)
CSN: [id number]     Arrival date & time 03/15/15  0719 History   First MD Initiated Contact with Patient 03/15/15 [phone]     Chief Complaint  Patient presents with  . Chest Pain  . Oral Swelling     (Consider location/radiation/quality/duration/timing/severity/associated sxs/prior Treatment) HPI Comments: Patient with a medical history significant for hypertension who does not see a doctor regularly presents today with 1-2 months of progressive difficulty swallowing, voice change, 30-40 pound weight loss. He is having a more difficult time eating because of difficulty swallowing. He also sometimes has difficulty swallowing his saliva. He is unable to sleep at night because of feeling like he is having a hard time breathing. He has had intermittent coughing spells and has been spitting up some blood. It's difficult to discern if he's coughing up blood or throwing up blood. Patient has intermittent shortness of breath worse with lying down. He denies night sweats, chills, fever. Patient smokes 2 packs a day drinks 1 beer a week and occasionally uses marijuana.  The history is provided by the patient.    Past Medical History  Diagnosis Date  . Hypertension    No past surgical history on file. History reviewed. No pertinent family history. History  Substance Use Topics  . Smoking status: Current Every Day Smoker -- 1.00 packs/day  . Smokeless tobacco: Not on file  . Alcohol Use: Yes    Review of Systems  Constitutional: Positive for unexpected weight change. Negative for fever and appetite change.  Respiratory: Positive for cough and shortness of breath.   Gastrointestinal: Negative for nausea, vomiting and abdominal pain.  Musculoskeletal: Positive for back pain.       Recently developed upper back pain  Neurological: Positive for weakness.  All other systems reviewed and are negative.     Allergies  Aspirin  Home Medications   Prior to Admission medications   Not on File    There were no vitals taken for this visit. Physical Exam  Constitutional: He is oriented to person, place, and time. He appears well-developed. He appears cachectic. He appears ill. No distress.  HENT:  Head: Normocephalic and atraumatic.  Mouth/Throat: Mucous membranes are dry.  Mild erythema and ulcer noted on the right tonsillar pillar  Eyes: Conjunctivae and EOM are normal. Pupils are equal, round, and reactive to light.  Neck: Normal range of motion. Neck supple. Tracheal tenderness present. No tracheal deviation present.    Shotty lymphadenopathy present throughout the anterior cervical chain. No supraclavicular nodes present.  Muffled hot potato voice  Cardiovascular: Normal rate, regular rhythm and intact distal pulses.   No murmur heard. Pulmonary/Chest: Effort normal and breath sounds normal. No stridor. No respiratory distress. He has no wheezes. He has no rales. He exhibits no tenderness.  Abdominal: Soft. He exhibits no distension. There is no tenderness. There is no rebound and no guarding.  Musculoskeletal: Normal range of motion. He exhibits no edema or tenderness.  Neurological: He is alert and oriented to person, place, and time.  Skin: Skin is warm and dry. No rash noted. No erythema. There is pallor.  Psychiatric: He has a normal mood and affect. His behavior is normal.  Nursing note and vitals reviewed.   ED Course  Procedures (including critical care time) Labs Review Labs Reviewed  CBC WITH DIFFERENTIAL/PLATELET - Abnormal; Notable for the following:    WBC 12.5 (*)    Neutrophils Relative % 80 (*)    Neutro Abs 10.0 (*)  All other components within normal limits  COMPREHENSIVE METABOLIC PANEL - Abnormal; Notable for the following:    Sodium 134 (*)    Chloride 92 (*)    Glucose, Bld 100 (*)    Total Protein 8.5 (*)    Albumin 3.4 (*)    All other components within normal limits  I-STAT CHEM 8, ED - Abnormal; Notable for the following:    Potassium  3.0 (*)    Chloride 91 (*)    Glucose, Bld 101 (*)    Calcium, Ion 1.25 (*)    All other components within normal limits  HIV ANTIBODY (ROUTINE TESTING)  I-STAT CG4 LACTIC ACID, ED    Imaging Review Dg Chest 2 View  03/15/2015   CLINICAL DATA:  Chest pain, cough  EXAM: CHEST  2 VIEW  COMPARISON:  None.  FINDINGS: The heart size and mediastinal contours are within normal limits. Both lungs are clear. The visualized skeletal structures are unremarkable.  IMPRESSION: No active cardiopulmonary disease.   Electronically Signed   By: Inez Catalina M.D.   On: 03/15/2015 08:14   Ct Soft Tissue Neck W Contrast  03/15/2015   CLINICAL DATA:  Weight loss. Neck mass. Swelling. Short of breath.  EXAM: CT NECK WITH CONTRAST  TECHNIQUE: Multidetector CT imaging of the neck was performed using the standard protocol following the bolus administration of intravenous contrast.  CONTRAST:  160mL OMNIPAQUE IOHEXOL 300 MG/ML  SOLN  COMPARISON:  None.  FINDINGS: I think there is an extensive circumferential malignancy in the hypo pharyngeal/supraglottic region. This is difficult to measure precisely because of a superficial spreading pattern. The most bulky tumor is in the right supraglottic region.  There are multiple enlarged necrotic lymph nodes consistent with metastatic involvement. This includes retropharyngeal and parapharyngeal nodes, level 2 nodes bilaterally, level 3 and left-sided level for lymph nodes. The largest nodal mass is a a right level 2 conglomeration measuring 5.2 x 3.8 x 3.5 cm in diameter.  Parotid glands are normal. Submandibular glands are normal. Thyroid gland contains a 13 mm cyst or nodule in the left lobe. Carotid arteries and jugular veins are patent, though of both jugular veins show extrinsic compression by large nodal masses.  No significant bony finding.  IMPRESSION: I think there is an advanced hypopharyngeal/ supraglottic mass, largely with a superficial spreading pattern. Massive bilateral  necrotic metastatic lymphadenopathy.  I did consider other possible etiologies such as tuberculosis, but think that is less likely.   Electronically Signed   By: Nelson Chimes M.D.   On: 03/15/2015 09:11   Ct Angio Chest Pe W/cm &/or Wo Cm  03/15/2015   CLINICAL DATA:  Weight loss with hemoptysis  EXAM: CT ANGIOGRAPHY CHEST WITH CONTRAST  TECHNIQUE: Multidetector CT imaging of the chest was performed using the standard protocol during bolus administration of intravenous contrast. Multiplanar CT image reconstructions and MIPs were obtained to evaluate the vascular anatomy.  CONTRAST:  115mL OMNIPAQUE IOHEXOL 300 MG/ML  SOLN  COMPARISON:  None.  FINDINGS: The lungs are well aerated bilaterally without focal infiltrate or sizable effusion. No parenchymal mass lesion is seen. The thoracic aorta and its branches are within normal limits. Prominence of the ascending aorta to 4 cm is noted. No dissection is identified. Normal tapering is noted distally. The pulmonary artery is within normal limits without evidence of filling defect to suggest pulmonary embolism. No significant hilar or mediastinal adenopathy is noted.  The thoracic inlet shows evidence of lymphadenopathy similar to that seen  on the recent CT of the neck. The dominant lymph node is noted adjacent to the left common carotid artery best seen on image number 31 of series 406. It measures 2.9 x 2.0 cm in dimension.  The visualized upper abdomen shows evidence of cholelithiasis. Area decreased attenuation is noted within the liver adjacent to the falciform ligament consistent with focal fatty infiltration. No acute bony abnormality is noted.  Review of the MIP images confirms the above findings.  IMPRESSION: No evidence of pulmonary embolism.  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 Recommend annual imaging followup by CTA or MRA. This recommendation follows 2010 ACCF/AHA/AATS/ACR/ASA/SCA/SCAI/SIR/STS/SVM Guidelines for the Diagnosis and Management of Patients  with Thoracic Aortic Disease. Circulation. 2010; 121: W431-V400  Left supraclavicular adenopathy.  Cholelithiasis.   Electronically Signed   By: Inez Catalina M.D.   On: 03/15/2015 09:18     EKG Interpretation   Date/Time:  Sunday March 15 2015 07:32:06 EDT Ventricular Rate:  94 PR Interval:  178 QRS Duration: 88 QT Interval:  376 QTC Calculation: 470 R Axis:   57 Text Interpretation:  Sinus rhythm RSR' in V1 or V2, probably normal  variant No previous tracing Confirmed by Maryan Rued  MD, Kisha Messman ([id number]) on  03/15/2015 7:51:30 AM      MDM   Final diagnoses:  Chest pain    Patient presenting today for evaluation of sore throat, difficulty swallowing and muffled voice. This has been ongoing for 1-2 months. He has had extensive weight loss up to 30 or 40 pounds. Prior to this he has not sought evaluation and has not seen a doctor in years. Patient is a heavy smoker. He denies any smokeless tobacco.  Other than his throat complaints he started to develop chest pain and pain between his shoulder blades. Occasional hemoptysis with cough.  Based on patient's exam finding of extensive masses present in the neck concern for malignancy. CBC, CMP, HIV, chem 8, lactic acid, CT of the neck, chest, chest x-ray and EKG pending   9:28 AM Labs without acute findings. Imaging shows extensive evidence of necrotic metastases as well as a mass wrapping around the hypopharynx which is most likely the in media cause of patient's symptoms. Discussed with the patient and his sister the findings. Patient is willing to stay for further evaluation. Will admit to medicine however patient will need oncology referral and possibly palliative radiation for some symptom relief.  Blanchie Dessert, MD 03/15/15 [phone]

## 2015-03-15 NOTE — ED Notes (Signed)
2nd attempt

## 2015-03-15 NOTE — Consult Note (Addendum)
Kyle Alexander, Kyle Alexander 54 y.o., male [id number]     Chief Complaint: sore throat  HPI:  54 yo bm 40-60 pack yr smoker, also hx heavy EtOH, comes in with 40+ lb weight loss.  Has had painful difficulty with swallowing x 8 mos, progressive.    Was seen at Urgent Care at that time and did not follow through with ER eval.  Can barely swallow secretions.   Eating poorly.  Breathing with difficulty at times.  Neck masses both sides x 2 mos.  Some referred otalgia, RIGHT.  Hemoptysis x several weeks.  No hx cancer.  CT scan chest shows aortic aneurysm without dissection, 4 cm.  CXR and CT chest with no evident metastatic lesions.  CT neck shows a large soft tissue mass in oropharynx, hypopharynx, larynx.  Irregularity of vocal cords, RIGHT>LEFT.   Massive bilateral necrotic centered adenopathy including parapharyngeal nodes, retropharyngeal nodes, RIGHT level II,III, IV nodes, LEFT level II, III,  V nodes.  Cyst in LEFT thyroid lobe is probably incidental.    PMH: Past Medical History  Diagnosis Date  . Hypertension     Surg Hx: Past Surgical History  Procedure Laterality Date  . Knee surgery  1990  . Tonsillectomy and adenoidectomy      FHx:   Family History  Problem Relation Age of Onset  . Cancer Mother   . Diabetes Mother   . Diabetes Father   . Hypertension Father    SocHx:  reports that he has been smoking.  He has quit using smokeless tobacco. He reports that he drinks about 18.0 oz of alcohol per week. He reports that he does not use illicit drugs.  ALLERGIES:  Allergies  Allergen Reactions  . Aspirin     Makes me bleed    No prescriptions prior to admission    Results for orders placed or performed during the hospital encounter of 03/15/15 (from the past 48 hour(s))  CBC with Differential/Platelet     Status: Abnormal   Collection Time: 03/15/15  7:40 AM  Result Value Ref Range   WBC 12.5 (H) 4.0 - 10.5 K/uL   RBC 4.36 4.22 - 5.81 MIL/uL   Hemoglobin 14.6 13.0 - 17.0 g/dL    HCT 41.8 39.0 - 52.0 %   MCV 95.9 78.0 - 100.0 fL   MCH 33.5 26.0 - 34.0 pg   MCHC 34.9 30.0 - 36.0 g/dL   RDW 14.7 11.5 - 15.5 %   Platelets 200 150 - 400 K/uL   Neutrophils Relative % 80 (H) 43 - 77 %   Neutro Abs 10.0 (H) 1.7 - 7.7 K/uL   Lymphocytes Relative 13 12 - 46 %   Lymphs Abs 1.6 0.7 - 4.0 K/uL   Monocytes Relative 6 3 - 12 %   Monocytes Absolute 0.8 0.1 - 1.0 K/uL   Eosinophils Relative 1 0 - 5 %   Eosinophils Absolute 0.1 0.0 - 0.7 K/uL   Basophils Relative 0 0 - 1 %   Basophils Absolute 0.0 0.0 - 0.1 K/uL  Comprehensive metabolic panel     Status: Abnormal   Collection Time: 03/15/15  7:40 AM  Result Value Ref Range   Sodium 134 (L) 135 - 145 mmol/L   Potassium 3.5 3.5 - 5.1 mmol/L   Chloride 92 (L) 96 - 112 mmol/L   CO2 32 19 - 32 mmol/L   Glucose, Bld 100 (H) 70 - 99 mg/dL   BUN 10 6 - 23 mg/dL   Creatinine,  Ser 0.75 0.50 - 1.35 mg/dL   Calcium 35.5 8.4 - 66.5 mg/dL   Total Protein 8.5 (H) 6.0 - 8.3 g/dL   Albumin 3.4 (L) 3.5 - 5.2 g/dL   AST 26 0 - 37 U/L   ALT 15 0 - 53 U/L   Alkaline Phosphatase 76 39 - 117 U/L   Total Bilirubin 0.6 0.3 - 1.2 mg/dL   GFR calc non Af Amer >90 >90 mL/min   GFR calc Af Amer >90 >90 mL/min    Comment: (NOTE) The eGFR has been calculated using the CKD EPI equation. This calculation has not been validated in all clinical situations. eGFR's persistently <90 mL/min signify possible Chronic Kidney Disease.    Anion gap 10 5 - 15  I-Stat CG4 Lactic Acid, ED     Status: None   Collection Time: 03/15/15  7:50 AM  Result Value Ref Range   Lactic Acid, Venous 1.82 0.5 - 2.0 mmol/L  I-stat chem 8, ed     Status: Abnormal   Collection Time: 03/15/15  7:52 AM  Result Value Ref Range   Sodium 135 135 - 145 mmol/L   Potassium 3.0 (L) 3.5 - 5.1 mmol/L   Chloride 91 (L) 96 - 112 mmol/L   BUN 11 6 - 23 mg/dL   Creatinine, Ser 1.85 0.50 - 1.35 mg/dL   Glucose, Bld 998 (H) 70 - 99 mg/dL   Calcium, Ion 5.80 (H) 1.12 - 1.23 mmol/L    TCO2 31 0 - 100 mmol/L   Hemoglobin 17.0 13.0 - 17.0 g/dL   HCT 04.5 69.1 - 98.7 %   Dg Chest 2 View  03/15/2015   CLINICAL DATA:  Chest pain, cough  EXAM: CHEST  2 VIEW  COMPARISON:  None.  FINDINGS: The heart size and mediastinal contours are within normal limits. Both lungs are clear. The visualized skeletal structures are unremarkable.  IMPRESSION: No active cardiopulmonary disease.   Electronically Signed   By: Alcide Clever M.D.   On: 03/15/2015 08:14   Ct Soft Tissue Neck W Contrast  03/15/2015   CLINICAL DATA:  Weight loss. Neck mass. Swelling. Short of breath.  EXAM: CT NECK WITH CONTRAST  TECHNIQUE: Multidetector CT imaging of the neck was performed using the standard protocol following the bolus administration of intravenous contrast.  CONTRAST:  OMNIPAQUE IOHEXOL 300 MG/ML  SOLN  COMPARISON:  None.  FINDINGS: I think there is an extensive circumferential malignancy in the hypo pharyngeal/supraglottic region. This is difficult to measure precisely because of a superficial spreading pattern. The most bulky tumor is in the right supraglottic region.  There are multiple enlarged necrotic lymph nodes consistent with metastatic involvement. This includes retropharyngeal and parapharyngeal nodes, level 2 nodes bilaterally, level 3 and left-sided level for lymph nodes. The largest nodal mass is a a right level 2 conglomeration measuring 5.2 x 3.8 x 3.5 cm in diameter.  Parotid glands are normal. Submandibular glands are normal. Thyroid gland contains a 13 mm cyst or nodule in the left lobe. Carotid arteries and jugular veins are patent, though of both jugular veins show extrinsic compression by large nodal masses.  No significant bony finding.  IMPRESSION: I think there is an advanced hypopharyngeal/ supraglottic mass, largely with a superficial spreading pattern. Massive bilateral necrotic metastatic lymphadenopathy.  I did consider other possible etiologies such as tuberculosis, but think that is  less likely.   Electronically Signed   By: Paulina Fusi M.D.   On: 03/15/2015 09:11  Ct Angio Chest Pe W/cm &/or Wo Cm  03/15/2015   CLINICAL DATA:  Weight loss with hemoptysis  EXAM: CT ANGIOGRAPHY CHEST WITH CONTRAST  TECHNIQUE: Multidetector CT imaging of the chest was performed using the standard protocol during bolus administration of intravenous contrast. Multiplanar CT image reconstructions and MIPs were obtained to evaluate the vascular anatomy.  CONTRAST:  138mL OMNIPAQUE IOHEXOL 300 MG/ML  SOLN  COMPARISON:  None.  FINDINGS: The lungs are well aerated bilaterally without focal infiltrate or sizable effusion. No parenchymal mass lesion is seen. The thoracic aorta and its branches are within normal limits. Prominence of the ascending aorta to 4 cm is noted. No dissection is identified. Normal tapering is noted distally. The pulmonary artery is within normal limits without evidence of filling defect to suggest pulmonary embolism. No significant hilar or mediastinal adenopathy is noted.  The thoracic inlet shows evidence of lymphadenopathy similar to that seen on the recent CT of the neck. The dominant lymph node is noted adjacent to the left common carotid artery best seen on image number 31 of series 406. It measures 2.9 x 2.0 cm in dimension.  The visualized upper abdomen shows evidence of cholelithiasis. Area decreased attenuation is noted within the liver adjacent to the falciform ligament consistent with focal fatty infiltration. No acute bony abnormality is noted.  Review of the MIP images confirms the above findings.  IMPRESSION: No evidence of pulmonary embolism.  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 Recommend annual imaging followup by CTA or MRA. This recommendation follows 2010 ACCF/AHA/AATS/ACR/ASA/SCA/SCAI/SIR/STS/SVM Guidelines for the Diagnosis and Management of Patients with Thoracic Aortic Disease. Circulation. 2010; 121: X833-A250  Left supraclavicular adenopathy.  Cholelithiasis.    Electronically Signed   By: Inez Catalina M.D.   On: 03/15/2015 09:18     Blood pressure 138/87, pulse 98, temperature 98.3 F (36.8 C), temperature source Oral, resp. rate 16, height 5' 10.5" (1.791 m), SpO2 100 %.  PHYSICAL EXAM: Overall appearance:  Cachectic.   Mentally appropriate.  Muffled voice. Head:  NCAT Ears:  clear Nose:  Sl dry c/w smoking  Oral Cavity:  Multiple teeth in poor repair.  Moist membranes Oral Pharynx/Hypopharynx/Larynx:  Large ulcerated lesion barely visible behind tongue base.   Neuro:  Grossly intact Neck:  RIGHT level II,III, IV masses.  LEFT level II,III V nodes.  Thin and normal midline anatomy.    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  Massive malnutrition and weight loss. Regular tobacco and alcohol abuse.   Multiple logistical and social issues.  This is a non-surgical lesion.  Chances of overall cure are quite poor.  If he is interested in any kind of therapy, will likely need a biopsy under anesthesia and tracheostomy to keep anesthesia safe and to allow airway protection and pulmonary toilet during treatment.  Will also need a G tube.   Will wait until after panendoscopy to see if IR could do this, or whether General Surgery will be the safer option.    Consults placed to Dietitian, Social Work, Chief Executive Officer, Radiation Oncology.  Will not be able to do a PET scan as long as he is an inpatient.    He would like to try to boost his alimentation orally, but I remain skeptical that this will be sufficient.    He may require an alternate residence during treatment, either SNF or possibly Hospice.  If he chooses no care, or simply palliative care, then  I would allow him to smoke in the appropriate time and place.    [name] 0/12/2222, 2:15 PM    With informed consent, using 2% viscous xylocaine for topical anesthesia, the flexible laryngoscope was used to examine the  pharynx.  Nasopharynx shows clear mucosa, but a submucosal mass pressing the soft palate anteriorly on the RIGHT side.  Oropharynx shows bulky tumor mass which is ulcerated and bleeds with manipulation.  Difficult to assess the entire extent of the mass.  Hypopharynx again shows tumor mass.  Larynx shows a juvenile epiglottis.  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    [name]  12/12/4641, [phone]

## 2015-03-15 NOTE — ED Notes (Signed)
Patient transported to CT without distress 

## 2015-03-15 NOTE — H&P (Signed)
Date: 03/15/2015               Patient Name:  Kyle Alexander MRN: [id number]  DOB: [birth year] Age / Sex: 54 y.o., male   PCP: No Pcp Per Patient         Medical Service: Internal Medicine Teaching Service         Attending Physician: Dr. Bertha Stakes, MD    First Contact: MS4 Jethro Poling  Pager: [phone]  Second Contact: Dr. Juluis Mire Pager: [phone]       After Hours (After 5p/  First Contact Pager: [phone]  weekends / holidays): Second Contact Pager: [phone]   Chief Complaint: Dysphagia, weight loss, enlarging neck mass   History of Present Illness:   Koichi Platte. Vitali is a 54 year old man with past medical history of hypertension and tobacco/alcohol abuse who presents with progressive dysphagia, weight loss, and enlarging neck mass for past several months.   Per his sister he has had dysphagia since at least August where he was seen in the ED for 4-6 week history of dysphagia to solids, voice hoarseness, and tender cervical lymphadenopathy. He unfortunately left AMA without further work-up at that time  He reports his dysphagia has progressively gotten worse in the past 3 months and now to both solids, liquids, and oral secretions. He has had episodes of choking for the past 2 weeks and 3-pillow orthopnea causing him inability to sleep. He has had enlarging tender bilateral neck masses first noticed about 3 months ago as a lump in his throat with associated voice hoarseness and drooling for the past 2-4 weeks. He has also had hemoptysis for the past 2 weeks and occasional wheezing. He has had 30-40 lb weight loss in the past several months (reports his normal weight is 172 lb). He has very poor dentition and pulled his right upper molar a few days ago due to decay. He has bilateral ear pain and occasional jaw pain. He reports having a syncopal episode three days ago after standing up to go to the bathroom with feeling of lightheadedness before the episode occurred. He reports  having syncope in the past (as a child) with no prior work-up.   He has used chewing tobacco in his younger days and is a current pack cigarette smoker for the past 30 years or so. He is a life-long alcohol drinker and drink a few beers a week. He is able to drink alcohol currently and reports it numbs his throat. He has history of alcohol withdrawal seizure approximately 5 years ago. He denies head/neck radiation or thyroid problems. There is family history of colon cancer in his mother. He has poor medical follow-up and has not seen a doctor in years.            In the ED CT neck revealed advanced hypopharyngeal/supraglottic mas and massive bilateral necrotic metastatic lymphadenopathy. CTA chest did not reveal evidence of PE. There was 4 cm dilation of the ascending aorta.    Meds: No current facility-administered medications on file prior to encounter.   No current outpatient prescriptions on file prior to encounter.   Allergies: Allergies as of 03/15/2015 - Review Complete 03/15/2015  Allergen Reaction Noted  . Aspirin  07/21/2014   Past Medical History  Diagnosis Date  . Hypertension    Past Surgical History  Procedure Laterality Date  . Knee surgery  1990  . Tonsillectomy and adenoidectomy     Family History  Problem Relation Age of Onset  .  Cancer Mother   . Diabetes Mother   . Diabetes Father   . Hypertension Father    History   Social History  . Marital Status: Single    Spouse Name: N/A  . Number of Children: N/A  . Years of Education: N/A   Occupational History  . Not on file.   Social History Main Topics  . Smoking status: Current Every Day Smoker -- 1.00 packs/day for 35 years  . Smokeless tobacco: Former Systems developer  . Alcohol Use: 18.0 oz/week    30 Standard drinks or equivalent per week  . Drug Use: No  . Sexual Activity: Not Currently   Other Topics Concern  . Not on file   Social History Narrative   Lives alone, unemployed. Previously worked in  Architect.    Review of Systems: Pertinent items are noted in HPI.   +Chronic blurry vision +Mild upper-mid back pain +Chronic constipation  +GERD +Myalgias  +Decreased energy +LE paraesthesias  +Decreased Urine output   Physical Exam: Blood pressure 134/89, pulse 93, temperature 98 F (36.7 C), temperature source Oral, resp. rate 14, height 5' 10.5" (1.791 m), SpO2 100 %.  General: NAD, cachectic appearing  HEENT: Right and left fixed, non-tender, immboile golf-sized masses, left sided pharyngeal deviation. Bilateral cervical lymphadenopathy.Few teeth.    Lungs: Clear to ausculation bilaterally. No wheezing, rhonchi, or rales.   Heart: Normal rate and rhythm  Extremities: No edema Neuro: A& O x 3. Normal muscle strength. Decreased sensation to light to touch of b/l LE.     Lab results: Basic Metabolic Panel:  Recent Labs  03/15/15 0740 03/15/15 0752  NA 134* 135  K 3.5 3.0*  CL 92* 91*  CO2 32  --   GLUCOSE 100* 101*  BUN 10 11  CREATININE 0.75 0.80  CALCIUM 10.0  --    Liver Function Tests:  Recent Labs  03/15/15 0740  AST 26  ALT 15  ALKPHOS 76  BILITOT 0.6  PROT 8.5*  ALBUMIN 3.4*   CBC:  Recent Labs  03/15/15 0740 03/15/15 0752  WBC 12.5*  --   NEUTROABS 10.0*  --   HGB 14.6 17.0  HCT 41.8 50.0  MCV 95.9  --   PLT 200  --     Imaging results:  Dg Chest 2 View  03/15/2015   CLINICAL DATA:  Chest pain, cough  EXAM: CHEST  2 VIEW  COMPARISON:  None.  FINDINGS: The heart size and mediastinal contours are within normal limits. Both lungs are clear. The visualized skeletal structures are unremarkable.  IMPRESSION: No active cardiopulmonary disease.   Electronically Signed   By: Inez Catalina M.D.   On: 03/15/2015 08:14   Ct Soft Tissue Neck W Contrast  03/15/2015   CLINICAL DATA:  Weight loss. Neck mass. Swelling. Short of breath.  EXAM: CT NECK WITH CONTRAST  TECHNIQUE: Multidetector CT imaging of the neck was performed using the standard  protocol following the bolus administration of intravenous contrast.  CONTRAST:  192mL OMNIPAQUE IOHEXOL 300 MG/ML  SOLN  COMPARISON:  None.  FINDINGS: I think there is an extensive circumferential malignancy in the hypo pharyngeal/supraglottic region. This is difficult to measure precisely because of a superficial spreading pattern. The most bulky tumor is in the right supraglottic region.  There are multiple enlarged necrotic lymph nodes consistent with metastatic involvement. This includes retropharyngeal and parapharyngeal nodes, level 2 nodes bilaterally, level 3 and left-sided level for lymph nodes. The largest nodal mass is a a  right level 2 conglomeration measuring 5.2 x 3.8 x 3.5 cm in diameter.  Parotid glands are normal. Submandibular glands are normal. Thyroid gland contains a 13 mm cyst or nodule in the left lobe. Carotid arteries and jugular veins are patent, though of both jugular veins show extrinsic compression by large nodal masses.  No significant bony finding.  IMPRESSION: I think there is an advanced hypopharyngeal/ supraglottic mass, largely with a superficial spreading pattern. Massive bilateral necrotic metastatic lymphadenopathy.  I did consider other possible etiologies such as tuberculosis, but think that is less likely.   Electronically Signed   By: Nelson Chimes M.D.   On: 03/15/2015 09:11   Ct Angio Chest Pe W/cm &/or Wo Cm  03/15/2015   CLINICAL DATA:  Weight loss with hemoptysis  EXAM: CT ANGIOGRAPHY CHEST WITH CONTRAST  TECHNIQUE: Multidetector CT imaging of the chest was performed using the standard protocol during bolus administration of intravenous contrast. Multiplanar CT image reconstructions and MIPs were obtained to evaluate the vascular anatomy.  CONTRAST:  166mL OMNIPAQUE IOHEXOL 300 MG/ML  SOLN  COMPARISON:  None.  FINDINGS: The lungs are well aerated bilaterally without focal infiltrate or sizable effusion. No parenchymal mass lesion is seen. The thoracic aorta and its  branches are within normal limits. Prominence of the ascending aorta to 4 cm is noted. No dissection is identified. Normal tapering is noted distally. The pulmonary artery is within normal limits without evidence of filling defect to suggest pulmonary embolism. No significant hilar or mediastinal adenopathy is noted.  The thoracic inlet shows evidence of lymphadenopathy similar to that seen on the recent CT of the neck. The dominant lymph node is noted adjacent to the left common carotid artery best seen on image number 31 of series 406. It measures 2.9 x 2.0 cm in dimension.  The visualized upper abdomen shows evidence of cholelithiasis. Area decreased attenuation is noted within the liver adjacent to the falciform ligament consistent with focal fatty infiltration. No acute bony abnormality is noted.  Review of the MIP images confirms the above findings.  IMPRESSION: No evidence of pulmonary embolism.  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 Recommend annual imaging followup by CTA or MRA. This recommendation follows 2010 ACCF/AHA/AATS/ACR/ASA/SCA/SCAI/SIR/STS/SVM Guidelines for the Diagnosis and Management of Patients with Thoracic Aortic Disease. Circulation. 2010; 121: W466-Z993  Left supraclavicular adenopathy.  Cholelithiasis.   Electronically Signed   By: Inez Catalina M.D.   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 -ENT consulted and to see patient for biopsy -Oncology consulted and will see pt later this week after biopsy  -Obtain prealbumin, magnesium, phosphorous, B12/folate, vitamin D to assess nutritional status  -Obtain dietician consult  -NPO for now -IV D5NS with KCl @  100 ml/hr x 12 hrs -Oxygen therapy as needed to keep SpO2 >92% -Albuterol nebulizer Q 4 hr PRN wheezing    -SW consult for discussion of HCPO  Hypokalemia - K 3-3.5 on admission. Pt with chronic hypokalemia in setting of alcohol abuse. No recent GI losses.  -Obtain Mg level, replete as necessary  -IV D5NS with KCl 20 mEq  @ 100 ml/hr x 12 hrs -Monitor BMP  Left Thyroid Nodule - Pt with 13 mm cyst or nodule in the left thyroid lobe. -Obtain TSH level   Hypertension - Currently normotensive. Pt not on antihypertensives at home. -Hold anti-hypertensives in setting of volume depletion and syncope -Continue to monitor   Tobacco Abuse - Pt with 1-2 pack history for approx past 30 years.  -Place nicotine 21 mg patch daily  -Pt counseled on tobacco cessation   Alcohol Abuse - Reports last drink 1 day ago and drinks few beers weekly. History of remote alcohol-withdrawal seizures.  -Obtain ethanol level, UA, and PT/PTlevels -Place on CIWA protocol   Marijuana Abuse - Pt reports smoking THC 1-2 times weekly.  -Obtain UDS -Pt counseled on cessation   History of B12 deficiency - Per sister was on IM injections in the past with imbalance and paraesthesias  -Obtain B12 and folate levels   Thoracic Aortic Dilation - 4 cm ascending aortic dilation seen on CTA chest. -Needs follow-up CTA or MRA in 1 year  Prolonged QT - EKG 470 in setting of hypokalemia and alcohol abuse.  -Avoid prolonging QT  medications  -Replete potassium and magnesium   Hepatitis C and HIV Ab testing - No prior testing.  -Obtain HIV and HCV Ab    Diet: NPO for now  DVT Ppx: Lovenox  Code: Full   Dispo: Disposition is deferred at this time, awaiting improvement of current medical problems. Anticipated discharge in approximately 3-4 day(s).   The patient does not have a current PCP (No Pcp Per Patient) and does need an [facility] hospital follow-up appointment after discharge.  The patient does not have transportation limitations that hinder transportation to clinic appointments.  Signed: Juluis Mire, MD 03/15/2015, 11:14 AM

## 2015-03-15 NOTE — Progress Notes (Signed)
Pt admitted to floor from ED at 1115 to 4N07. Pt oriented to room, vital signs taken and assessment completed. No complaints of pain at this time. Sister at bedside.

## 2015-03-16 ENCOUNTER — Inpatient Hospital Stay (HOSPITAL_COMMUNITY): Payer: Medicaid Other

## 2015-03-16 ENCOUNTER — Encounter (HOSPITAL_COMMUNITY): Payer: Self-pay | Admitting: Radiology

## 2015-03-16 ENCOUNTER — Other Ambulatory Visit: Payer: Self-pay | Admitting: Oncology

## 2015-03-16 ENCOUNTER — Ambulatory Visit
Admit: 2015-03-16 | Discharge: 2015-03-16 | Disposition: A | Discharge status: Home / Self Care | Payer: Medicaid Other | Attending: Radiation Oncology | Admitting: Radiation Oncology

## 2015-03-16 DIAGNOSIS — C770 Secondary and unspecified malignant neoplasm of lymph nodes of head, face and neck: Secondary | ICD-10-CM

## 2015-03-16 DIAGNOSIS — K1379 Other lesions of oral mucosa: Secondary | ICD-10-CM

## 2015-03-16 DIAGNOSIS — R634 Abnormal weight loss: Secondary | ICD-10-CM

## 2015-03-16 DIAGNOSIS — C321 Malignant neoplasm of supraglottis: Secondary | ICD-10-CM | POA: Insufficient documentation

## 2015-03-16 DIAGNOSIS — C140 Malignant neoplasm of pharynx, unspecified: Secondary | ICD-10-CM

## 2015-03-16 DIAGNOSIS — Z931 Gastrostomy status: Secondary | ICD-10-CM | POA: Insufficient documentation

## 2015-03-16 DIAGNOSIS — F17210 Nicotine dependence, cigarettes, uncomplicated: Secondary | ICD-10-CM | POA: Insufficient documentation

## 2015-03-16 DIAGNOSIS — R1310 Dysphagia, unspecified: Secondary | ICD-10-CM

## 2015-03-16 DIAGNOSIS — Z510 Encounter for antineoplastic radiation therapy: Secondary | ICD-10-CM | POA: Insufficient documentation

## 2015-03-16 DIAGNOSIS — I712 Thoracic aortic aneurysm, without rupture: Secondary | ICD-10-CM

## 2015-03-16 DIAGNOSIS — F419 Anxiety disorder, unspecified: Secondary | ICD-10-CM | POA: Insufficient documentation

## 2015-03-16 DIAGNOSIS — R221 Localized swelling, mass and lump, neck: Secondary | ICD-10-CM

## 2015-03-16 DIAGNOSIS — F1910 Other psychoactive substance abuse, uncomplicated: Secondary | ICD-10-CM

## 2015-03-16 DIAGNOSIS — L598 Other specified disorders of the skin and subcutaneous tissue related to radiation: Secondary | ICD-10-CM | POA: Insufficient documentation

## 2015-03-16 DIAGNOSIS — E538 Deficiency of other specified B group vitamins: Secondary | ICD-10-CM | POA: Diagnosis present

## 2015-03-16 DIAGNOSIS — E43 Unspecified severe protein-calorie malnutrition: Secondary | ICD-10-CM

## 2015-03-16 DIAGNOSIS — C760 Malignant neoplasm of head, face and neck: Secondary | ICD-10-CM

## 2015-03-16 HISTORY — DX: Malignant neoplasm of pharynx, unspecified: C14.0

## 2015-03-16 HISTORY — PX: LYMPH NODE BIOPSY: SHX201

## 2015-03-16 LAB — BASIC METABOLIC PANEL
ANION GAP: 9 (ref 5–15)
BUN: <5 mg/dL — ABNORMAL LOW (ref 6–23)
CHLORIDE: 97 mmol/L (ref 96–112)
CO2: 28 mmol/L (ref 19–32)
CREATININE: 0.6 mg/dL (ref 0.50–1.35)
Calcium: 9.1 mg/dL (ref 8.4–10.5)
GFR calc Af Amer: >90 mL/min (ref >90)
GFR calc non Af Amer: >90 mL/min (ref >90)
Glucose, Bld: 127 mg/dL — ABNORMAL HIGH (ref 70–99)
POTASSIUM: 3.4 mmol/L — AB (ref 3.5–5.1)
Sodium: 134 mmol/L — ABNORMAL LOW (ref 135–145)

## 2015-03-16 LAB — CBC
HCT: 34.4 % — ABNORMAL LOW (ref 39.0–52.0)
HEMOGLOBIN: 11.8 g/dL — AB (ref 13.0–17.0)
MCH: 32.8 pg (ref 26.0–34.0)
MCHC: 34.3 g/dL (ref 30.0–36.0)
MCV: 95.6 fL (ref 78.0–100.0)
PLATELETS: 180 10*3/uL (ref 150–400)
RBC: 3.6 MIL/uL — AB (ref 4.22–5.81)
RDW: 14.8 % (ref 11.5–15.5)
WBC: 10.3 10*3/uL (ref 4.0–10.5)

## 2015-03-16 LAB — FOLATE: Folate: 3.2 ng/mL — ABNORMAL LOW

## 2015-03-16 LAB — HEPATITIS C ANTIBODY (REFLEX): HCV Ab: NEGATIVE

## 2015-03-16 LAB — VITAMIN B12: Vitamin B-12: 480 pg/mL (ref 211–911)

## 2015-03-16 LAB — HIV ANTIBODY (ROUTINE TESTING W REFLEX): HIV Screen 4th Generation wRfx: NONREACTIVE

## 2015-03-16 MED ORDER — ENSURE PUDDING PO PUDG: 1.0000 | Freq: Three times a day (TID) | ORAL | Status: DC

## 2015-03-16 MED ORDER — LIDOCAINE HCL (PF) 1 % IJ SOLN
INTRAMUSCULAR | Status: AC
  Filled 2015-03-16: qty 10

## 2015-03-16 MED ORDER — ADULT MULTIVITAMIN LIQUID CH
5.0000 mL | Freq: Every day | ORAL | Status: DC
  Administered 2015-03-17: 5 mL via ORAL
  Filled 2015-03-16 (×2): qty 5

## 2015-03-16 MED ORDER — MAGNESIUM SULFATE 2 GM/50ML IV SOLN
2.0000 g | Freq: Once | INTRAVENOUS | Status: AC
  Administered 2015-03-16: 2 g via INTRAVENOUS
  Filled 2015-03-16: qty 50

## 2015-03-16 MED ORDER — THIAMINE HCL 100 MG/ML IJ SOLN
100.0000 mg | Freq: Every day | INTRAMUSCULAR | Status: DC
  Administered 2015-03-17: 100 mg via INTRAVENOUS
  Filled 2015-03-16: qty 2

## 2015-03-16 MED ORDER — FOLIC ACID 5 MG/ML IJ SOLN
1.0000 mg | Freq: Every day | INTRAMUSCULAR | Status: DC
  Administered 2015-03-17: 1 mg via INTRAVENOUS
  Filled 2015-03-16 (×2): qty 0.2

## 2015-03-16 MED ORDER — BOOST / RESOURCE BREEZE PO LIQD
1.0000 | Freq: Three times a day (TID) | ORAL | Status: DC
  Administered 2015-03-16 – 2015-03-17 (×3): 1 via ORAL

## 2015-03-16 MED ORDER — ONDANSETRON HCL 4 MG/2ML IJ SOLN: 4.0000 mg | Freq: Four times a day (QID) | INTRAMUSCULAR | Status: DC | PRN

## 2015-03-16 MED ORDER — POTASSIUM CHLORIDE 10 MEQ/100ML IV SOLN
10.0000 meq | INTRAVENOUS | Status: AC
  Administered 2015-03-16 (×2): 10 meq via INTRAVENOUS
  Filled 2015-03-16 (×2): qty 100

## 2015-03-16 MED ORDER — INFLUENZA VAC SPLIT QUAD 0.5 ML IM SUSY
0.5000 mL | PREFILLED_SYRINGE | INTRAMUSCULAR | Status: DC
  Filled 2015-03-16: qty 0.5

## 2015-03-16 MED ORDER — DEXTROSE-NACL 5-0.45 % IV SOLN
INTRAVENOUS | Status: AC
  Administered 2015-03-16 – 2015-03-17 (×2): via INTRAVENOUS

## 2015-03-16 NOTE — Consult Note (Signed)
[facility]  Date of Service: 03/16/2015  Length of Stay: 1 Patient's PCP: No PCP Per Patient  Attending Physician: Lianne Moris  Patient Name: Kyle Alexander  MRN: [id number]  CSN:   Location: 4N07C/4N07C-01 Consulting Physician: Adella Hare  Note Date and Time: 03/16/2015 2:19 PM  Date of Admission: 03/15/2015  7:20 AM     Consultation Requested Regarding: Goals of care/possible hospice Consult Requested: Kyle Alexander is a 54 y.o. male admitted with Neck mass. Palliative Care referral received from Dr. Maryan Rued and has been contacted regarding referral and requests the team focus on goals of care and disposition planning.  Assessment:  Patient's current condition awake and alert. Sounds like some pooled secretions at times. Denies marked pain or discomfort except with pill swallowing (will change all meds to liquid or crushed form). Admits to being scared about diagnosis, treatment and prognosis  Patient Symptom Assessment Flowsheet has been completed.  Family  - sister present during discussion. He communicates reqularly with brothers and sisters. He is a very private person. Discharge Planning: too early for definitive planning  Plan:  1. Support offered at this time.  2. Palliative Care Team will continue to follow patient. 3. Diagnostic work-up in progress to be followed by cancer conference and then treatment options 4. Nutritional status - poor with 40+ lb wt loss. Unable to take adequate oral intake. Has been presented option of PEG - needs to give this consideration - not ready to make decision about having procedure. 5. He is desirous of taking a LOA as soon as initial work-up done to take care of "personal business" and take a walk in the woods to think through his options and decisions. Strong desire to remain independent at home.  HPI: 54 yo bm 40-60 pack yr smoker, also hx heavy EtOH, comes in with 40+ lb weight loss.  Has had painful difficulty with swallowing x 8 mos, progressive. Was seen at Urgent Care at that time and did not follow through with ER eval. Can barely swallow secretions. Eating poorly. Breathing with difficulty at times. Neck masses both sides x 2 mos. Some referred otalgia, RIGHT. Hemoptysis x several weeks. No hx cancer.  CT scan chest shows aortic aneurysm without dissection, 4 cm. CXR and CT chest with no evident metastatic lesions.  CT neck shows a large soft tissue mass in oropharynx, hypopharynx, larynx. Irregularity of vocal cords, RIGHT>LEFT. Massive bilateral necrotic centered adenopathy including parapharyngeal nodes, retropharyngeal nodes, RIGHT level II,III, IV nodes, LEFT level II, III, V nodes. Cyst in LEFT thyroid lobe is probably incidental.   Patient has been seen by ENT, Radiation and medical oncology. For tissue biopsy today. Problem List:   Patient Active Problem List   Diagnosis Date Noted  . Folate deficiency 03/16/2015  . Protein-calorie malnutrition, severe 03/16/2015  . Neck mass 03/15/2015  . HTN (hypertension) 03/15/2015  . Tobacco abuse 03/15/2015  . Alcohol abuse 03/15/2015  . Alcohol withdrawal seizure 03/15/2015  . Prolonged Q-T interval on ECG 03/15/2015  . Aortic dilatation 03/15/2015  . Thyroid nodule 03/15/2015  . Lymphadenopathy 03/15/2015  . Hypokalemia 03/15/2015  . B12 deficiency 03/15/2015  . Marijuana abuse 03/15/2015    Medications:  Scheduled:  . enoxaparin (LOVENOX) injection  40 mg Subcutaneous Q24H  . feeding supplement (RESOURCE BREEZE)  1 Container Oral TID BM  . folic acid  1 mg Intravenous Daily  . [START ON 03/17/2015] Influenza vac split quadrivalent PF  0.5 mL  Intramuscular Tomorrow-1000  . LORazepam  0-4 mg Intravenous Q6H   Followed by  . [START ON 03/17/2015] LORazepam  0-4 mg Intravenous Q12H  . multivitamin with minerals  1 tablet Oral Daily  . nicotine  21 mg Transdermal Daily  . thiamine  100 mg Oral  Daily    Infusions:     PRN:  acetaminophen **OR** acetaminophen, albuterol, LORazepam **OR** LORazepam, polyethylene glycol  Prior to Admission Medications:  No prescriptions prior to admission    Allergies:  Allergies  Allergen Reactions  . Aspirin     Makes me bleed    Diet: NPO Hospitalizations: no prior hospitalizations  Medical History:  Past Medical History  Diagnosis Date  . Hypertension     Surgical History:  Past Surgical History  Procedure Laterality Date  . Knee surgery  1990  . Tonsillectomy and adenoidectomy      Family History:  Family History  Problem Relation Age of Onset  . Cancer Mother   . Diabetes Mother   . Diabetes Father   . Hypertension Father     Social History:  History   Social History  . Marital Status: Single    Spouse Name: N/A  . Number of Children: N/A  . Years of Education: N/A   Occupational History  .  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   Social History Main Topics  . Smoking status: Current Every Day Smoker -- 1.00 packs/day for 35 years  . Smokeless tobacco: Former Systems developer  . Alcohol Use: 18.0 oz/week    30 Standard drinks or equivalent per week  . Drug Use: THC  . Sexual Activity: Not Currently   Other Topics Concern  . Not on file   Social History Narrative   Lives alone, unemployed. Previously worked in Architect. Patient finished High school. 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  Life-long bachelor. NO Children. Lives alone. In his youth and 20's was a heavy drinker but denies heavy use for years.    Review of Systems:  Pertinent items are noted in HPI. No problems except for swallowing and throat pain. Physical Examination:  BP  Min: 114/88  Max: 159/88 Temp  Avg: 98.3 F (36.8 C)  Min: 97.7 F (36.5 C)  Max: 98.8 F (37.1 C) Pulse   Avg: 89.9  Min: 79  Max: 98 Resp  Avg: 17.8  Min: 12  Max: 33 ECG Heart Rate  Avg: 89.6  Min: 74  Max: 100 MAP (mmHg)  Avg: 98.4  Min: 95  Max: 106  SpO2  Avg: 99.8 %  Min: 98 %  Max: 100 % O2 Flow Rate (L/min)  Avg: 1 L/min  Min: 1 L/min  Max: 1 L/min  Height and Weight Height: 5' 10.5" (179.1 cm) Weight: 54.658 kg (120 lb 8 oz) Weight in (lb) to have BMI = 25: 176.4     PE:  Gen'l - thin black man in no distress HEENT - C&S clear,  Neck - full ROM, visible enlargement of lower neck Cor- RRR Pulm - normal respirations Neuro - A&O x 3, MAE, no tremor. Psych - calm but worried.  Lab Data:  CBC    Component Value Date/Time   WBC 10.3 03/16/2015 0605   RBC 3.60* 03/16/2015 0605   HGB 11.8* 03/16/2015 0605   HCT 34.4* 03/16/2015 1962  PLT 180 03/16/2015 0605   MCV 95.6 03/16/2015 0605   MCH 32.8 03/16/2015 0605   MCHC 34.3 03/16/2015 0605   RDW 14.8 03/16/2015 0605   LYMPHSABS 1.6 03/15/2015 0740   MONOABS 0.8 03/15/2015 0740   EOSABS 0.1 03/15/2015 0740   BASOSABS 0.0 03/15/2015 0740    BMET    Component Value Date/Time   NA 134* 03/16/2015 0605   K 3.4* 03/16/2015 0605   CL 97 03/16/2015 0605   CO2 28 03/16/2015 0605   GLUCOSE 127* 03/16/2015 0605   BUN <5* 03/16/2015 0605   CREATININE 0.60 03/16/2015 0605   CALCIUM 9.1 03/16/2015 0605   GFRNONAA >90 03/16/2015 0605   GFRAA >90 03/16/2015 0605    HIV - neg RPR- neg Hep C - neg    Cultures:   Imaging:  CT angio chest 03/15/15  IMPRESSION: No evidence of pulmonary embolism.  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 Recommend annual imaging followup by CTA or MRA. Left supraclavicular adenopathy.Cholelithiasis.  03/15/15 CT soft tissue neck IMPRESSION: I think there is an advanced hypopharyngeal/ supraglottic mass, largely with a superficial spreading pattern. Massive bilateral necrotic metastatic lymphadenopathy.   Past Medical History:  has a past medical history of Hypertension.  Code Status:  Full Code  Advanced Directives: Does patient have an advance directive?: No  Family/Support: He has all the support he wants  Prolonged discussion about function of palliative care. Stressed the importance of being sure he has full information for decsion making. Reenforced with him that he is in control and that we will support him no matter what he decides. He is "14% certain" he will opt for treatment. He is advised to inquire about "bang for the buck."  No acute palliative care needs at this time.   Greater than 50% visit spent on counseling and education about patient control and treatment options, long term care goals.    Completed by: Adella Hare

## 2015-03-16 NOTE — Progress Notes (Signed)
Radiation Oncology         [phone] ________________________________  Initial Inpatient Consultation  Name: QUAMIR WILLEMSEN MRN: [id number]  Date: 03/15/2015  DOB: 12/19/1960  CC:No PCP Per Patient  No ref. 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 Cancer of neck 195.0 C76.0   2. Chest pain 786.50 R07.9 DG Chest 2 View     DG Chest 2 View  3. Pulmonary embolism 415.19 I26.99 CT Angio Chest PE W/Cm &/Or Wo Cm     CT Angio Chest PE W/Cm &/Or Wo Cm    HISTORY OF PRESENT ILLNESS::Yoon S Marsico is a 54 y.o. male with tobacco and ETOH abuse history who presented with weight loss of 40+ lb,  dysphagia, neck masses, and hemoptysis. He was admitted with CT imaging of neck and check, notable for a mass extending from the oropharynx to the hypopharynx and larynx, and bilateral bulky neck adenopathy. Possibly nasopharyngeal involvement to my eye. No obvious metastases in chest on CTA. PET has not been done.  I have spoken with Dr [name] about him.  At present, he does not seem to be in imminent risk of airway compromise, but a biopsy in the throat would require tracheostomy.   He reports, also, b/l otalgia and sore throat. He also feels his symptoms came on abruptly, including his weight loss. He denies frank respiratory distress. He had noted voice changes.   PREVIOUS RADIATION THERAPY: No  PAST MEDICAL HISTORY:  has a past medical history of Hypertension.    PAST SURGICAL HISTORY: Past Surgical History  Procedure Laterality Date  . Knee surgery  1990  . Tonsillectomy and adenoidectomy      FAMILY HISTORY: family history includes Cancer in his mother; Diabetes in his father and mother; Hypertension in his father.  SOCIAL HISTORY:  reports that he has been smoking.  He has quit using smokeless tobacco. He reports that he drinks about 18.0 oz of alcohol per  week. He reports that he does not use illicit drugs.  ALLERGIES: Aspirin  MEDICATIONS:  Current Facility-Administered Medications  Medication Dose Route Frequency Provider Last Rate Last Dose  . acetaminophen (TYLENOL) tablet 650 mg  650 mg Oral Q6H PRN Juluis Mire, MD       Or  . acetaminophen (TYLENOL) suppository 650 mg  650 mg Rectal Q6H PRN Marjan Rabbani, MD      . albuterol (PROVENTIL) (2.5 MG/3ML) 0.083% nebulizer solution 5 mg  5 mg Nebulization Q4H PRN Juluis Mire, MD   5 mg at 03/15/15 2256  . enoxaparin (LOVENOX) injection 40 mg  40 mg Subcutaneous Q24H Marjan Rabbani, MD   40 mg at 03/15/15 1748  . folic acid (FOLVITE) tablet 1 mg  1 mg Oral Daily Marjan Rabbani, MD   1 mg at 03/16/15 0942  . [START ON 03/17/2015] Influenza vac split quadrivalent PF (FLUARIX) injection 0.5 mL  0.5 mL Intramuscular Tomorrow-1000 Bertha Stakes, MD      . LORazepam (ATIVAN) injection 0-4 mg  0-4 mg Intravenous Q6H Marjan Rabbani, MD   0 mg at 03/15/15 1013   Followed by  . [START ON 03/17/2015] LORazepam (ATIVAN) injection 0-4 mg  0-4 mg Intravenous Q12H Marjan Rabbani, MD      . LORazepam (ATIVAN) tablet 1 mg  1 mg Oral Q6H PRN Juluis Mire, MD       Or  . LORazepam (  ATIVAN) injection 1 mg  1 mg Intravenous Q6H PRN Juluis Mire, MD   1 mg at 03/16/15 0128  . magnesium sulfate IVPB 2 g 50 mL  2 g Intravenous Once Marjan Rabbani, MD      . multivitamin with minerals tablet 1 tablet  1 tablet Oral Daily Juluis Mire, MD   1 tablet at 03/16/15 0942  . nicotine (NICODERM CQ - dosed in mg/24 hours) patch 21 mg  21 mg Transdermal Daily Marjan Rabbani, MD   21 mg at 03/16/15 0942  . polyethylene glycol (MIRALAX / GLYCOLAX) packet 17 g  17 g Oral Daily PRN Marjan Rabbani, MD      . potassium chloride 10 mEq in 100 mL IVPB  10 mEq Intravenous Q1 Hr x 2 Marjan Rabbani, MD   10 mEq at 03/16/15 0943  . thiamine (VITAMIN B-1) tablet 100 mg  100 mg Oral Daily Marjan Rabbani, MD   100 mg at 03/16/15 0942      REVIEW OF SYSTEMS:  Notable for that above.   PHYSICAL EXAM:  height is 5' 10.5" (1.791 m) and weight is 120 lb 8 oz (54.658 kg). His oral temperature is 98.4 F (36.9 C). His blood pressure is 134/84 and his pulse is 88. His respiration is 20 and oxygen saturation is 100%.   General: Alert and oriented, in no acute distress. FRAIL. No stridor, but voice is a little wet sounding HEENT: Head is normocephalic. Extraocular movements are intact. Oropharynx is notable for significant swelling in right soft palate. Tongue midline. Neck: bilateral enlarged nodes in levels II. Dominant mass is 4 finger widths on left, 2 finger widths on right Heart: Regular in rate and rhythm with no murmurs, rubs, or gallops. Chest: coarse sounds throughout on expiration Abdomen: Soft, nontender, nondistended, with no rigidity or guarding. Extremities: No cyanosis or edema. Lymphatics: see Neck Exam Skin: No concerning lesions. Musculoskeletal: symmetric strength and muscle tone throughout. Neurologic: Cranial nerves II through XII are grossly intact. No obvious focalities. Speech is fluent.  Psychiatric: Judgment and insight are intact. Affect is appropriate.    ECOG = 3  0 - Asymptomatic (Fully active, able to carry on all predisease activities without restriction)  1 - Symptomatic but completely ambulatory (Restricted in physically strenuous activity but ambulatory and able to carry out work of a light or sedentary nature. For example, light housework, office work)  2 - Symptomatic, <50% in bed during the day (Ambulatory and capable of all self care but unable to carry out any work activities. Up and about more than 50% of waking hours)  3 - Symptomatic, >50% in bed, but not bedbound (Capable of only limited self-care, confined to bed or chair 50% or more of waking hours)  4 - Bedbound (Completely disabled. Cannot carry on any self-care. Totally confined to bed or chair)  5 - Death   Eustace Pen MM,  Creech RH, Tormey DC, et al. [phone]). "Toxicity and response criteria of the [facility]". [place] Oncol. 5 (6): 649-55   LABORATORY DATA:  Lab Results  Component Value Date   WBC 10.3 03/16/2015   HGB 11.8* 03/16/2015   HCT 34.4* 03/16/2015   MCV 95.6 03/16/2015   PLT 180 03/16/2015   CMP     Component Value Date/Time   NA 134* 03/16/2015 0605   K 3.4* 03/16/2015 0605   CL 97 03/16/2015 0605   CO2 28 03/16/2015 0605   GLUCOSE 127* 03/16/2015 0605   BUN <  5* 03/16/2015 0605   CREATININE 0.60 03/16/2015 0605   CALCIUM 9.1 03/16/2015 0605   PROT 8.5* 03/15/2015 0740   ALBUMIN 3.4* 03/15/2015 0740   AST 26 03/15/2015 0740   ALT 15 03/15/2015 0740   ALKPHOS 76 03/15/2015 0740   BILITOT 0.6 03/15/2015 0740   GFRNONAA >90 03/16/2015 0605   GFRAA >90 03/16/2015 0605         RADIOGRAPHY: Dg Chest 2 View  03/15/2015   CLINICAL DATA:  Chest pain, cough  EXAM: CHEST  2 VIEW  COMPARISON:  None.  FINDINGS: The heart size and mediastinal contours are within normal limits. Both lungs are clear. The visualized skeletal structures are unremarkable.  IMPRESSION: No active cardiopulmonary disease.   Electronically Signed   By: Inez Catalina M.D.   On: 03/15/2015 08:14   Ct Soft Tissue Neck W Contrast  03/15/2015   CLINICAL DATA:  Weight loss. Neck mass. Swelling. Short of breath.  EXAM: CT NECK WITH CONTRAST  TECHNIQUE: Multidetector CT imaging of the neck was performed using the standard protocol following the bolus administration of intravenous contrast.  CONTRAST:  152mL OMNIPAQUE IOHEXOL 300 MG/ML  SOLN  COMPARISON:  None.  FINDINGS: I think there is an extensive circumferential malignancy in the hypo pharyngeal/supraglottic region. This is difficult to measure precisely because of a superficial spreading pattern. The most bulky tumor is in the right supraglottic region.  There are multiple enlarged necrotic lymph nodes consistent with metastatic involvement. This  includes retropharyngeal and parapharyngeal nodes, level 2 nodes bilaterally, level 3 and left-sided level for lymph nodes. The largest nodal mass is a a right level 2 conglomeration measuring 5.2 x 3.8 x 3.5 cm in diameter.  Parotid glands are normal. Submandibular glands are normal. Thyroid gland contains a 13 mm cyst or nodule in the left lobe. Carotid arteries and jugular veins are patent, though of both jugular veins show extrinsic compression by large nodal masses.  No significant bony finding.  IMPRESSION: I think there is an advanced hypopharyngeal/ supraglottic mass, largely with a superficial spreading pattern. Massive bilateral necrotic metastatic lymphadenopathy.  I did consider other possible etiologies such as tuberculosis, but think that is less likely.   Electronically Signed   By: Nelson Chimes M.D.   On: 03/15/2015 09:11   Ct Angio Chest Pe W/cm &/or Wo Cm  03/15/2015   CLINICAL DATA:  Weight loss with hemoptysis  EXAM: CT ANGIOGRAPHY CHEST WITH CONTRAST  TECHNIQUE: Multidetector CT imaging of the chest was performed using the standard protocol during bolus administration of intravenous contrast. Multiplanar CT image reconstructions and MIPs were obtained to evaluate the vascular anatomy.  CONTRAST:  156mL OMNIPAQUE IOHEXOL 300 MG/ML  SOLN  COMPARISON:  None.  FINDINGS: The lungs are well aerated bilaterally without focal infiltrate or sizable effusion. No parenchymal mass lesion is seen. The thoracic aorta and its branches are within normal limits. Prominence of the ascending aorta to 4 cm is noted. No dissection is identified. Normal tapering is noted distally. The pulmonary artery is within normal limits without evidence of filling defect to suggest pulmonary embolism. No significant hilar or mediastinal adenopathy is noted.  The thoracic inlet shows evidence of lymphadenopathy similar to that seen on the recent CT of the neck. The dominant lymph node is noted adjacent to the left common  carotid artery best seen on image number 31 of series 406. It measures 2.9 x 2.0 cm in dimension.  The visualized upper abdomen shows evidence of cholelithiasis. Area  decreased attenuation is noted within the liver adjacent to the falciform ligament consistent with focal fatty infiltration. No acute bony abnormality is noted.  Review of the MIP images confirms the above findings.  IMPRESSION: No evidence of pulmonary embolism.  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 Recommend annual imaging followup by CTA or MRA. This recommendation follows 2010 ACCF/AHA/AATS/ACR/ASA/SCA/SCAI/SIR/STS/SVM Guidelines for the Diagnosis and Management of Patients with Thoracic Aortic Disease. Circulation. 2010; 121: N462-V035  Left supraclavicular adenopathy.  Cholelithiasis.   Electronically Signed   By: Inez Catalina M.D.   On: 03/15/2015 09:18      IMPRESSION/PLAN: Today, I talked to the patient about the findings and work-up thus far. We discussed the patient's diagnosis of putative throat cancer, and general treatment for this, highlighting the role of radiotherapy in the management. We discussed the available radiation techniques, and focused on the details of logistics and delivery.    I spoke with Dr [name] about ordering IR to biopsy a neck node to spare patient from a more involved procedure to obtain tissue from the primary lesion. He has ordered this.  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  We could try to give 7 weeks of therapy for the best chance of durable locoregional control and durable improvement of his symptoms (at the expense of significant acute side effects) and a very small chance of cure.   But, the patient is very hesitant to have a PEG tube placed.  This makes me concerned that he would not tolerate radiotherapy with curative intent.  We discussed this. He will think more about the PEG  Another option to  be considered is induction chemotherapy and then RT.  Or, a more palliative, short course of radiotherapy.  The patient has history of ETOH and tobacco abuse which likely caused this cancer. The patient was counseled to stop using ETOH/tobacco. I am not sure if he is motivated.  Although I cannot make inpatient orders, I recommended the following to be facilitated during his stay:   1) I highly recommend a PEG tube due to his nutritional compromise and dysphagia. This can be via IR or general surgery.   IF THE PATIENT IS WILLING  2) Highly recommend maximum guidance from a nutritionist   3) Highly recommend maximum guidance from a swallowing therapist as well.  4) Patient will need social work's involvement given his social situation.  5) Palliative care consult   We discussed the risks, benefits, and side effects of radiotherapy. Side effects may include but not necessarily be limited to: sore throat, skin irritation, mucositis, thick saliva, injury to internal organs of the head and neck, hypothyroidism, lymphedema. No guarantees of treatment were given. A consent form was signed and placed in the patient's medical record.   The patient was encouraged to ask questions that I answered to the best of my ability.     I HAVE ADDED HIM TO ENT TUMOR BOARD FOR DISCUSSION ON 03-18-15.  __________________________________________   [name], MD

## 2015-03-16 NOTE — Consult Note (Signed)
[place] CONSULT NOTE  Patient Care Team: No Pcp Per Patient as PCP - General (General Practice) I have seen the patient, examined him and edited the notes as follows  CHIEF COMPLAINTS/PURPOSE OF CONSULTATION:  Head and Neck Cancer  HISTORY OF PRESENTING ILLNESS:  Kyle Alexander 54 y.o. male admitted on 4/3 with 8 month history of progressive dysphagia with solids and increased salivary secretions, and a 2 month history of painful bilateral neck masses. He had initially been seen at Urgent Care in 07/2014 at which time further workup which he did not follow through. Since then, he has lost 40 pounds due to decreased appetite. He reports right otalgia without hearing deficit or tinnitus. He reports changes in the quality of his voice as well. He  Denies any fevers or chills. Denies night sweats. Reports increased dyspnea and 4 week history of hemoptysis. Denies cardiac complaints. Denies abdominal pain. He has associated nausea without vomiting due to increased secretions. Patient has a history of alcohol and tobacco abuse.  On 4/3, he underwent imaging study with CT scan of the neck which showed a large soft tissue mass on the oropharynx, hypopharynx and larynx, bilateral irregularity of the vocal cords, right greater than left, massive bilateral necrotic centered adenopathy including parapharyngeal, retropharyngeal and right level II, III, V nodes. The largest nodal mass is a right level 2 conglomeration measuring 5.2 x 3.8 x 3.5 cm in diameter. CT angio of the chest shows evidence of lymphadenopathy similar to that of the CT of the neck. No acute bony abnormality is noted.  ENT was consulted on 4/3, concluding he is not a surgical resection candidate.Laryngoscopy on 4/3 was remarkable for submucosal mass pressing the soft palate anteriorly on the RIGHT side. Oropharynx shows bulky tumor mass which is ulcerated; Hypopharynx again shows tumor mass; cords are fully mobile and airway is  good.  Biopsy for tissue diagnosis to be performed today by IR. Radiation Oncology seen patient, recommending radiation only, if results were consistent with squamous cell carcinoma. While awaiting for the pathology report, we were kindly requested to seen the patient in consultation with recommendations   MEDICAL HISTORY:  Past Medical History  Diagnosis Date  . Hypertension     SURGICAL HISTORY: Past Surgical History  Procedure Laterality Date  . Knee surgery  1990  . Tonsillectomy and adenoidectomy      SOCIAL HISTORY: History   Social History  . Marital Status: Single    Spouse Name: N/A  . Number of Children: N/A  . Years of Education: N/A   Occupational History  . Not on file.   Social History Main Topics  . Smoking status: Current Every Day Smoker -- 1.00 packs/day for 40 years  . Smokeless tobacco: Former Systems developer  . Alcohol Use: 18.0 oz/week    30 Standard drinks or equivalent per week  . Drug Use: No  . Sexual Activity: Not Currently   Other Topics Concern  . Not on file   Social History Narrative   Lives alone, unemployed. Previously worked in Architect.    FAMILY HISTORY: Family History  Problem Relation Age of Onset  . Cancer Mother   . Diabetes Mother   . Diabetes Father   . Hypertension Father     ALLERGIES:  is allergic to aspirin.  MEDICATIONS:  Scheduled Meds: . enoxaparin (LOVENOX) injection  40 mg Subcutaneous Q24H  . feeding supplement (RESOURCE BREEZE)  1 Container Oral TID BM  . folic acid  1  mg Intravenous Daily  . [START ON 03/17/2015] Influenza vac split quadrivalent PF  0.5 mL Intramuscular Tomorrow-1000  . LORazepam  0-4 mg Intravenous Q6H   Followed by  . [START ON 03/17/2015] LORazepam  0-4 mg Intravenous Q12H  . multivitamin with minerals  1 tablet Oral Daily  . nicotine  21 mg Transdermal Daily  . thiamine  100 mg Oral Daily   Continuous Infusions:  PRN Meds:.acetaminophen **OR** acetaminophen, albuterol, LORazepam  **OR** LORazepam, polyethylene glycol   REVIEW OF SYSTEMS:   Constitutional: Denies fevers, chills or abnormal night sweats Eyes: Denies blurriness of vision, double vision or watery eyes Ears, nose, mouth, throat, and face: Denies mucositis. 8 month history of progressive dysphagia with solids and increased salivary secretions, Respiratory: Denies cough, or wheezes. Increased dyspnea Cardiovascular: Denies palpitations, chest discomfort or lower extremity swelling Gastrointestinal:  Reports nausea due to increased secretions. Denies heartburn or change in bowel habits. Lost 40 lbs in 8 months with decreased appetite Skin: Denies abnormal skin rashes Lymphatics: New neck lymphadenopathy bilaterally, over the last 2 months, denies easy bruising Neurological: Denies numbness, tingling or new weaknesses Behavioral/Psych: Mood is stable, no new changes  All other systems were reviewed with the patient and are negative.  PHYSICAL EXAMINATION: ECOG PERFORMANCE STATUS: 2  Filed Vitals:   03/16/15 0956  BP: 128/85  Pulse: 98  Temp: 98.4 F (36.9 C)  Resp: 20   Filed Weights   03/15/15 2147 03/16/15 0500  Weight: 120 lb 6.4 oz (54.613 kg) 120 lb 8 oz (54.658 kg)    GENERAL:alert, no distress and comfortable. Chronically ill appearing SKIN: skin color, texture, turgor are normal, no rashes or significant lesions EYES: normal, conjunctiva are pink and non-injected, sclera clear OROPHARYNX:no exudate, no erythema and lips, buccal mucosa, and tongue normal. Noted fullness at the base of his tongue. Very poor dentition is noted NECK:  Large bilateral cervical adenopathy, left greater than right, thyroid normal size, non-tender.  LYMPH:  no palpable lymphadenopathy in the cervical, axillary or inguinal LUNGS: clear to auscultation and percussion with normal breathing effort HEART: regular rate & rhythm and no murmurs and no lower extremity edema ABDOMEN:abdomen soft, non-tender and normal  bowel sounds Musculoskeletal:no cyanosis of digits and no clubbing  PSYCH: alert & oriented x 3 with fluent speech NEURO: no focal motor/sensory deficits  LABORATORY DATA:  I have reviewed the data as listed Lab Results  Component Value Date   WBC 10.3 03/16/2015   HGB 11.8* 03/16/2015   HCT 34.4* 03/16/2015   MCV 95.6 03/16/2015   PLT 180 03/16/2015   Lab Results  Component Value Date   NA 134* 03/16/2015   K 3.4* 03/16/2015   CL 97 03/16/2015   CO2 28 03/16/2015    RADIOGRAPHIC STUDIES:  Dg Chest 2 View  03/15/2015   CLINICAL DATA:  Chest pain, cough  EXAM: CHEST  2 VIEW  COMPARISON:  None.  FINDINGS: The heart size and mediastinal contours are within normal limits. Both lungs are clear. The visualized skeletal structures are unremarkable.  IMPRESSION: No active cardiopulmonary disease.   Electronically Signed   By: Inez Catalina M.D.   On: 03/15/2015 08:14   Ct Soft Tissue Neck W Contrast  03/15/2015   CLINICAL DATA:  Weight loss. Neck mass. Swelling. Short of breath.  EXAM: CT NECK WITH CONTRAST  TECHNIQUE: Multidetector CT imaging of the neck was performed using the standard protocol following the bolus administration of intravenous contrast.  CONTRAST:  131mL OMNIPAQUE IOHEXOL  300 MG/ML  SOLN  COMPARISON:  None.  FINDINGS: I think there is an extensive circumferential malignancy in the hypo pharyngeal/supraglottic region. This is difficult to measure precisely because of a superficial spreading pattern. The most bulky tumor is in the right supraglottic region.  There are multiple enlarged necrotic lymph nodes consistent with metastatic involvement. This includes retropharyngeal and parapharyngeal nodes, level 2 nodes bilaterally, level 3 and left-sided level for lymph nodes. The largest nodal mass is a a right level 2 conglomeration measuring 5.2 x 3.8 x 3.5 cm in diameter.  Parotid glands are normal. Submandibular glands are normal. Thyroid gland contains a 13 mm cyst or nodule in  the left lobe. Carotid arteries and jugular veins are patent, though of both jugular veins show extrinsic compression by large nodal masses.  No significant bony finding.  IMPRESSION: I think there is an advanced hypopharyngeal/ supraglottic mass, largely with a superficial spreading pattern. Massive bilateral necrotic metastatic lymphadenopathy.  I did consider other possible etiologies such as tuberculosis, but think that is less likely.   Electronically Signed   By: Nelson Chimes M.D.   On: 03/15/2015 09:11   Ct Angio Chest Pe W/cm &/or Wo Cm  03/15/2015   CLINICAL DATA:  Weight loss with hemoptysis  EXAM: CT ANGIOGRAPHY CHEST WITH CONTRAST  TECHNIQUE: Multidetector CT imaging of the chest was performed using the standard protocol during bolus administration of intravenous contrast. Multiplanar CT image reconstructions and MIPs were obtained to evaluate the vascular anatomy.  CONTRAST:  147mL OMNIPAQUE IOHEXOL 300 MG/ML  SOLN  COMPARISON:  None.  FINDINGS: The lungs are well aerated bilaterally without focal infiltrate or sizable effusion. No parenchymal mass lesion is seen. The thoracic aorta and its branches are within normal limits. Prominence of the ascending aorta to 4 cm is noted. No dissection is identified. Normal tapering is noted distally. The pulmonary artery is within normal limits without evidence of filling defect to suggest pulmonary embolism. No significant hilar or mediastinal adenopathy is noted.  The thoracic inlet shows evidence of lymphadenopathy similar to that seen on the recent CT of the neck. The dominant lymph node is noted adjacent to the left common carotid artery best seen on image number 31 of series 406. It measures 2.9 x 2.0 cm in dimension.  The visualized upper abdomen shows evidence of cholelithiasis. Area decreased attenuation is noted within the liver adjacent to the falciform ligament consistent with focal fatty infiltration. No acute bony abnormality is noted.  Review of  the MIP images confirms the above findings.  IMPRESSION: No evidence of pulmonary embolism.  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 Recommend annual imaging followup by CTA or MRA. This recommendation follows 2010 ACCF/AHA/AATS/ACR/ASA/SCA/SCAI/SIR/STS/SVM Guidelines for the Diagnosis and Management of Patients with Thoracic Aortic Disease. Circulation. 2010; 121: F643-P295  Left supraclavicular adenopathy.  Cholelithiasis.   Electronically Signed   By: Inez Catalina M.D.   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 He underwent Ultrasound guided biopsy by IR on 4/4 with results currently pending. Stage of the disease is to be determined A PET/CT scan will be ordered as outpatient. Prognosis would depend on its results.  Radiation Oncology seen patient, recommending radiation only, if results were consistent with squamous cell carcinoma. Will follow. Palliative Consult pending. I would feel he would benefit from chemotherapy but not concurrently as it would be too toxic. If chemotherapy is  contemplated, induction chemotherapy followed by radiation would be an option. I will discuss his case Wednesday morning at the next ENT tumor board and will see him in the afternoon for final therapy plan  Severe protein calorie malnutrition with dysphagia He has lost more than 40 lbs over the last 8 months He can tolerate liquids but may feeding to replete calories  Will need PEG tube due to malnutrition and dysphagia Agree with swallowing therapist follow up Appreciate Nutrition evaluation  Mouth/throat pain.  His pain appears to be  controlled with current pain regimen.  Nausea Due to increased secretions  Recommend we continue current anti-emetics as prescribed. Continue to provide IV hydration and oral liquid intake  Anemia with recent hemoptysis In the  setting of malnutrition, hemoptysis chronic and neoplastic disease Continue to monitor, no intervention is indicated at this time  Polysubstance Dependence Will need substance counseling prior to discharge  DVT prophylaxis On Lovenox May DC Lovenox if hemoptysis becomes more evident or other bleeding issues arise  Thoracic aneurysm He would be at risk for rupture. He need aggressive life-style modification  Full Code Will return to see him on Wednesday.   Crosby Oyster, Angee Gupton, MD 03/16/2015

## 2015-03-16 NOTE — Progress Notes (Signed)
UR complete.  Candise Crabtree RN, MSN 

## 2015-03-16 NOTE — Clinical Social Work Note (Signed)
Clinical Social Worker met with patient and pt's family at bedside in reference to disability. CSW explained disability process and contacted Development worker, community. CSW left a message with financial counselor, Aleene Davidson to complete Medicaid application with patient. CSW also reviewed process for Medicaid applications. Patient agreeable to meet with financial counselor.   RN placed Chaplain consult to assist patient and pt's family with establishing advanced directives.  CSW remains available as needed.  Glendon Axe, MSW, LCSWA [phone] 03/16/2015 4:23 PM

## 2015-03-16 NOTE — Progress Notes (Signed)
Referring Physician(s): *Dr. [name]  Subjective: 54 yo AA male admitted with sore throat and swollen LN in neck. Found to have extensive soft tissue mass in oropharynx, hypopharynx and large bilateral lymphadenopathy. ENT consult done and requests IR to do US guided biopsy. Imaging reviewed.  Past Medical History  Diagnosis Date  . Hypertension     History reviewed, patient examined, no change in status, stable for surgery. History   Social History  . Marital Status: Single    Spouse Name: N/A  . Number of Children: N/A  . Years of Education: N/A   Occupational History  . Not on file.   Social History Main Topics  . Smoking status: Current Every Day Smoker -- 1.00 packs/day for 35 years  . Smokeless tobacco: Former Systems developer  . Alcohol Use: 18.0 oz/week    30 Standard drinks or equivalent per week  . Drug Use: No  . Sexual Activity: Not Currently   Other Topics Concern  . Not on file   Social History Narrative   Lives alone, unemployed. Previously worked in Architect.     Allergies: Aspirin  Medications:  Current facility-administered medications:  .  acetaminophen (TYLENOL) tablet 650 mg, 650 mg, Oral, Q6H PRN **OR** acetaminophen (TYLENOL) suppository 650 mg, 650 mg, Rectal, Q6H PRN, Marjan Rabbani, MD .  albuterol (PROVENTIL) (2.5 MG/3ML) 0.083% nebulizer solution 5 mg, 5 mg, Nebulization, Q4H PRN, Marjan Rabbani, MD, 5 mg at 03/15/15 2256 .  enoxaparin (LOVENOX) injection 40 mg, 40 mg, Subcutaneous, Q24H, Marjan Rabbani, MD, 40 mg at 03/15/15 1748 .  feeding supplement (ENSURE) (ENSURE) pudding 1 Container, 1 Container, Oral, TID BM, Marjan Rabbani, MD .  folic acid injection 1 mg, 1 mg, Intravenous, Daily, Marjan Rabbani, MD .  Derrill Memo ON 03/17/2015] Influenza vac split quadrivalent PF (FLUARIX) injection 0.5 mL, 0.5 mL, Intramuscular, Tomorrow-1000, Bertha Stakes, MD .  LORazepam (ATIVAN) injection 0-4 mg, 0-4 mg, Intravenous, Q6H, 0 mg at 03/15/15 1013  **FOLLOWED BY** [START ON 03/17/2015] LORazepam (ATIVAN) injection 0-4 mg, 0-4 mg, Intravenous, Q12H, Marjan Rabbani, MD .  LORazepam (ATIVAN) tablet 1 mg, 1 mg, Oral, Q6H PRN **OR** LORazepam (ATIVAN) injection 1 mg, 1 mg, Intravenous, Q6H PRN, Marjan Rabbani, MD, 1 mg at 03/16/15 0128 .  magnesium sulfate IVPB 2 g 50 mL, 2 g, Intravenous, Once, Marjan Rabbani, MD, 2 g at 03/16/15 1108 .  multivitamin with minerals tablet 1 tablet, 1 tablet, Oral, Daily, Juluis Mire, MD, 1 tablet at 03/16/15 0942 .  nicotine (NICODERM CQ - dosed in mg/24 hours) patch 21 mg, 21 mg, Transdermal, Daily, Marjan Rabbani, MD, 21 mg at 03/16/15 0942 .  polyethylene glycol (MIRALAX / GLYCOLAX) packet 17 g, 17 g, Oral, Daily PRN, Juluis Mire, MD .  thiamine (VITAMIN B-1) tablet 100 mg, 100 mg, Oral, Daily, 100 mg at 03/16/15 0942 **OR** [DISCONTINUED] thiamine (B-1) injection 100 mg, 100 mg, Intravenous, Daily, Marjan Rabbani, MD   Review of Systems  Vital Signs: BP 134/84 mmHg  Pulse 88  Temp(Src) 98.4 F (36.9 C) (Oral)  Resp 20  Ht 5' 10.5" (1.791 m)  Wt 120 lb 8 oz (54.658 kg)  BMI 17.04 kg/m2  SpO2 100%  Physical Exam  Constitutional: He is oriented to person, place, and time. He appears well-developed and well-nourished. No distress.  Neck: Normal range of motion. No tracheal deviation present.  Large palpable, NT adenopathy bilateral cervical chain, L>R  Cardiovascular: Normal rate, regular rhythm and normal heart sounds.   Pulmonary/Chest: Effort normal and  breath sounds normal. No respiratory distress.  Abdominal: Soft. There is no tenderness.  Lymphadenopathy:    He has cervical adenopathy.  Neurological: He is alert and oriented to person, place, and time.  Psychiatric: He has a normal mood and affect. Judgment normal.    Imaging: Dg Chest 2 View  03/15/2015   CLINICAL DATA:  Chest pain, cough  EXAM: CHEST  2 VIEW  COMPARISON:  None.  FINDINGS: The heart size and mediastinal contours are  within normal limits. Both lungs are clear. The visualized skeletal structures are unremarkable.  IMPRESSION: No active cardiopulmonary disease.   Electronically Signed   By: Inez Catalina M.D.   On: 03/15/2015 08:14   Ct Soft Tissue Neck W Contrast  03/15/2015   CLINICAL DATA:  Weight loss. Neck mass. Swelling. Short of breath.  EXAM: CT NECK WITH CONTRAST  TECHNIQUE: Multidetector CT imaging of the neck was performed using the standard protocol following the bolus administration of intravenous contrast.  CONTRAST:  180mL OMNIPAQUE IOHEXOL 300 MG/ML  SOLN  COMPARISON:  None.  FINDINGS: I think there is an extensive circumferential malignancy in the hypo pharyngeal/supraglottic region. This is difficult to measure precisely because of a superficial spreading pattern. The most bulky tumor is in the right supraglottic region.  There are multiple enlarged necrotic lymph nodes consistent with metastatic involvement. This includes retropharyngeal and parapharyngeal nodes, level 2 nodes bilaterally, level 3 and left-sided level for lymph nodes. The largest nodal mass is a a right level 2 conglomeration measuring 5.2 x 3.8 x 3.5 cm in diameter.  Parotid glands are normal. Submandibular glands are normal. Thyroid gland contains a 13 mm cyst or nodule in the left lobe. Carotid arteries and jugular veins are patent, though of both jugular veins show extrinsic compression by large nodal masses.  No significant bony finding.  IMPRESSION: I think there is an advanced hypopharyngeal/ supraglottic mass, largely with a superficial spreading pattern. Massive bilateral necrotic metastatic lymphadenopathy.  I did consider other possible etiologies such as tuberculosis, but think that is less likely.   Electronically Signed   By: Nelson Chimes M.D.   On: 03/15/2015 09:11   Ct Angio Chest Pe W/cm &/or Wo Cm  03/15/2015   CLINICAL DATA:  Weight loss with hemoptysis  EXAM: CT ANGIOGRAPHY CHEST WITH CONTRAST  TECHNIQUE: Multidetector CT  imaging of the chest was performed using the standard protocol during bolus administration of intravenous contrast. Multiplanar CT image reconstructions and MIPs were obtained to evaluate the vascular anatomy.  CONTRAST:  127mL OMNIPAQUE IOHEXOL 300 MG/ML  SOLN  COMPARISON:  None.  FINDINGS: The lungs are well aerated bilaterally without focal infiltrate or sizable effusion. No parenchymal mass lesion is seen. The thoracic aorta and its branches are within normal limits. Prominence of the ascending aorta to 4 cm is noted. No dissection is identified. Normal tapering is noted distally. The pulmonary artery is within normal limits without evidence of filling defect to suggest pulmonary embolism. No significant hilar or mediastinal adenopathy is noted.  The thoracic inlet shows evidence of lymphadenopathy similar to that seen on the recent CT of the neck. The dominant lymph node is noted adjacent to the left common carotid artery best seen on image number 31 of series 406. It measures 2.9 x 2.0 cm in dimension.  The visualized upper abdomen shows evidence of cholelithiasis. Area decreased attenuation is noted within the liver adjacent to the falciform ligament consistent with focal fatty infiltration. No acute bony abnormality is noted.  Review of the MIP images confirms the above findings.  IMPRESSION: No evidence of pulmonary embolism.  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 Recommend annual imaging followup by CTA or MRA. This recommendation follows 2010 ACCF/AHA/AATS/ACR/ASA/SCA/SCAI/SIR/STS/SVM Guidelines for the Diagnosis and Management of Patients with Thoracic Aortic Disease. Circulation. 2010; 121: U235-T614  Left supraclavicular adenopathy.  Cholelithiasis.   Electronically Signed   By: Inez Catalina M.D.   On: 03/15/2015 09:18    Labs:  CBC:  Recent Labs  03/15/15 0740 03/15/15 0752 03/16/15 0605  WBC 12.5*  --  10.3  HGB 14.6 17.0 11.8*  HCT 41.8 50.0 34.4*  PLT 200  --  180     COAGS:  Recent Labs  03/15/15 1330  INR 1.08  APTT 34    BMP:  Recent Labs  03/15/15 0740 03/15/15 0752 03/16/15 0605  NA 134* 135 134*  K 3.5 3.0* 3.4*  CL 92* 91* 97  CO2 32  --  28  GLUCOSE 100* 101* 127*  BUN 10 11 <5*  CALCIUM 10.0  --  9.1  CREATININE 0.75 0.80 0.60  GFRNONAA >90  --  >90  GFRAA >90  --  >90    LIVER FUNCTION TESTS:  Recent Labs  03/15/15 0740  BILITOT 0.6  AST 26  ALT 15  ALKPHOS 76  PROT 8.5*  ALBUMIN 3.4*    Assessment and Plan: 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 Can do without sedation Has not had Lovenox yet today. Risks and Benefits discussed with the patient including, but not limited to bleeding, infection, damage to adjacent structures or low yield requiring additional tests. All of the patient's questions were answered, patient is agreeable to proceed. Consent signed and in chart.    I spent a total of 20 minutes face to face in clinical consultation/evaluation, greater than 50% of which was counseling/coordinating care for biopsy of lymph node  Signed: Ascencion Dike 03/16/2015, 11:06 AM

## 2015-03-16 NOTE — Progress Notes (Signed)
INITIAL NUTRITION ASSESSMENT  DOCUMENTATION CODES Per approved criteria  -Severe malnutrition in the context of chronic illness -Underweight   INTERVENTION: Resource Breeze po TID, each supplement provides 250 kcal and 9 grams of protein  NUTRITION DIAGNOSIS: Malnutrition related to chronic illness as evidenced by 25% weight loss x 4 months and severe fat and muscle depletion.   Goal: Pt to meet >/= 90% of their estimated nutrition needs   Monitor:  Diet advancement, PO intake, supplement acceptance, weight trends, labs  Reason for Assessment: MD consult  ASSESSMENT: Pt with hx of smoking and alcohol abuse and admitted with progressive dysphagia, weight loss, and enlarging neck mass for past several months. Pt has had dysphgia since 07/2014 Oncology consult pending.   Per sister pt has been losing weight since Christmas 2015 but has lost the most weight in the last 2-3 week, this likely coincides with worsening dysphagia. Per pt he consumed all of his full liquid tray this morning without difficulty.  Pt does not want to try ensure but is willing to try Breeze.   Labs:  Sodium and potassium are low Magnesium and Phosphorus are WNL Pt on folic acid, thiamine, and MVI.   Nutrition Focused Physical Exam:  Subcutaneous Fat:  Orbital Region: severe depletion Upper Arm Region: severe depletion Thoracic and Lumbar Region: severe depletion  Muscle:  Temple Region: severe depletion Clavicle Bone Region: severe depletion Clavicle and Acromion Bone Region: severe depletion Scapular Bone Region: severe depletion Dorsal Hand: severe depletion Patellar Region: severe depletion Anterior Thigh Region: severe depletion Posterior Calf Region: severe depletion  Edema: not present   Height: Ht Readings from Last 1 Encounters:  03/15/15 5' 10.5" (1.791 m)    Weight: Wt Readings from Last 1 Encounters:  03/16/15 120 lb 8 oz (54.658 kg)    Ideal Body Weight: 76.8 kg   %  Ideal Body Weight: 71%  Wt Readings from Last 10 Encounters:  03/16/15 120 lb 8 oz (54.658 kg)    Usual Body Weight: 160-170 lb   % Usual Body Weight: 75%  BMI:  Body mass index is 17.04 kg/(m^2).  Estimated Nutritional Needs: Kcal: 1900-2100 Protein: 90-110 grams Fluid: >/= 1.9 L/day  Skin: WDL  Diet Order: Diet full liquid Room service appropriate?: Yes; Fluid consistency:: Thin  EDUCATION NEEDS: -No education needs identified at this time  No intake or output data in the 24 hours ending 03/16/15 1001  Last BM: PTA   Labs:   Recent Labs Lab 03/15/15 0740 03/15/15 0752 03/15/15 1330 03/16/15 0605  NA 134* 135  --  134*  K 3.5 3.0*  --  3.4*  CL 92* 91*  --  97  CO2 32  --   --  28  BUN 10 11  --  <5*  CREATININE 0.75 0.80  --  0.60  CALCIUM 10.0  --   --  9.1  MG  --   --  1.6  --   PHOS  --   --  3.5  --   GLUCOSE 100* 101*  --  127*    CBG (last 3)  No results for input(s): GLUCAP in the last 72 hours.  Scheduled Meds: . enoxaparin (LOVENOX) injection  40 mg Subcutaneous Q24H  . folic acid  1 mg Oral Daily  . [START ON 03/17/2015] Influenza vac split quadrivalent PF  0.5 mL Intramuscular Tomorrow-1000  . LORazepam  0-4 mg Intravenous Q6H   Followed by  . [START ON 03/17/2015] LORazepam  0-4 mg  Intravenous Q12H  . magnesium sulfate 1 - 4 g bolus IVPB  2 g Intravenous Once  . multivitamin with minerals  1 tablet Oral Daily  . nicotine  21 mg Transdermal Daily  . potassium chloride  10 mEq Intravenous Q1 Hr x 2  . thiamine  100 mg Oral Daily    Continuous Infusions:   Maylon Peppers RD, [place], CNSC [phone] Pager [phone] After Hours Pager

## 2015-03-16 NOTE — Progress Notes (Signed)
Subjective: Patient reports decreased hemoptosis. Reports that he was able to eat some breakfast, grits and oatmeal, with only one choking episode. Patient says he slept well last night, and denies pain, nausea, or vomiting. Patient is requesting that medical issues be discussed with him alone, so that he can decide how and what to discuss with his sister, who has been present throughout this hospitalization.   Objective: Vital signs in last 24 hours: Filed Vitals:   03/15/15 2256 03/16/15 0115 03/16/15 0500 03/16/15 0617  BP:  144/101  134/84  Pulse:  86  88  Temp:  97.7 F (36.5 C)  98.4 F (36.9 C)  TempSrc:  Oral  Oral  Resp:  18  20  Height:      Weight:   54.658 kg (120 lb 8 oz)   SpO2: 98% 100%  100%   General: resting in bed HEENT: PERRL, EOMI, no scleral icterus, unchanged bilateral nonmobile, nontender neck masses Cardiac: RRR, no rubs, murmurs or gallops Pulm: clear to auscultation bilaterally, moving normal volumes of air Abd: soft, nontender, nondistended, BS present Ext: no pedal edema Neuro: alert and oriented X3, cranial nerves II-XII grossly intact  Lab Results: Basic Metabolic Panel:  Recent Labs  03/15/15 0740 03/15/15 0752 03/15/15 1330 03/16/15 0605  NA 134* 135  --  134*  K 3.5 3.0*  --  3.4*  CL 92* 91*  --  97  CO2 32  --   --  28  GLUCOSE 100* 101*  --  127*  BUN 10 11  --  <5*  CREATININE 0.75 0.80  --  0.60  CALCIUM 10.0  --   --  9.1  MG  --   --  1.6  --   PHOS  --   --  3.5  --    Liver Function Tests:  Recent Labs  03/15/15 0740  AST 26  ALT 15  ALKPHOS 76  BILITOT 0.6  PROT 8.5*  ALBUMIN 3.4*  CBC:  Recent Labs  03/15/15 0740 03/15/15 0752 03/16/15 0605  WBC 12.5*  --  10.3  NEUTROABS 10.0*  --   --   HGB 14.6 17.0 11.8*  HCT 41.8 50.0 34.4*  MCV 95.9  --  95.6  PLT 200  --  180   Thyroid Function Tests:  Recent Labs  03/15/15 1330  TSH 1.111   Anemia Panel:  Recent Labs  03/15/15 1330  VITAMINB12  480  FOLATE 3.2*   Coagulation:  Recent Labs  03/15/15 1330  LABPROT 14.2  INR 1.08   Urine Drug Screen: Drugs of Abuse     Component Value Date/Time   LABOPIA NONE DETECTED 03/15/2015 1533   COCAINSCRNUR NONE DETECTED 03/15/2015 1533   LABBENZ NONE DETECTED 03/15/2015 1533   AMPHETMU NONE DETECTED 03/15/2015 1533   THCU POSITIVE* 03/15/2015 1533   LABBARB NONE DETECTED 03/15/2015 1533    Alcohol Level:  Recent Labs  03/15/15 1330  ETH <5   Urinalysis:  Recent Labs  03/15/15 1533  COLORURINE AMBER*  LABSPEC >1.046*  PHURINE 5.5  GLUCOSEU NEGATIVE  HGBUR NEGATIVE  BILIRUBINUR SMALL*  KETONESUR 40*  PROTEINUR 30*  UROBILINOGEN 2.0*  NITRITE NEGATIVE  LEUKOCYTESUR NEGATIVE   Micro Results: Recent Results (from the past 240 hour(s))  MRSA PCR Screening     Status: None   Collection Time: 03/15/15 10:56 AM  Result Value Ref Range Status   MRSA by PCR NEGATIVE NEGATIVE Final    Comment:  The GeneXpert MRSA Assay (FDA approved for NASAL specimens only), is one component of a comprehensive MRSA colonization surveillance program. It is not intended to diagnose MRSA infection nor to guide or monitor treatment for MRSA infections.    Studies/Results: Dg Chest 2 View  03/15/2015   CLINICAL DATA:  Chest pain, cough  EXAM: CHEST  2 VIEW  COMPARISON:  None.  FINDINGS: The heart size and mediastinal contours are within normal limits. Both lungs are clear. The visualized skeletal structures are unremarkable.  IMPRESSION: No active cardiopulmonary disease.   Electronically Signed   By: Inez Catalina M.D.   On: 03/15/2015 08:14   Ct Soft Tissue Neck W Contrast  03/15/2015   CLINICAL DATA:  Weight loss. Neck mass. Swelling. Short of breath.  EXAM: CT NECK WITH CONTRAST  TECHNIQUE: Multidetector CT imaging of the neck was performed using the standard protocol following the bolus administration of intravenous contrast.  CONTRAST:  129mL OMNIPAQUE IOHEXOL 300 MG/ML   SOLN  COMPARISON:  None.  FINDINGS: I think there is an extensive circumferential malignancy in the hypo pharyngeal/supraglottic region. This is difficult to measure precisely because of a superficial spreading pattern. The most bulky tumor is in the right supraglottic region.  There are multiple enlarged necrotic lymph nodes consistent with metastatic involvement. This includes retropharyngeal and parapharyngeal nodes, level 2 nodes bilaterally, level 3 and left-sided level for lymph nodes. The largest nodal mass is a a right level 2 conglomeration measuring 5.2 x 3.8 x 3.5 cm in diameter.  Parotid glands are normal. Submandibular glands are normal. Thyroid gland contains a 13 mm cyst or nodule in the left lobe. Carotid arteries and jugular veins are patent, though of both jugular veins show extrinsic compression by large nodal masses.  No significant bony finding.  IMPRESSION: I think there is an advanced hypopharyngeal/ supraglottic mass, largely with a superficial spreading pattern. Massive bilateral necrotic metastatic lymphadenopathy.  I did consider other possible etiologies such as tuberculosis, but think that is less likely.   Electronically Signed   By: Nelson Chimes M.D.   On: 03/15/2015 09:11   Ct Angio Chest Pe W/cm &/or Wo Cm  03/15/2015   CLINICAL DATA:  Weight loss with hemoptysis  EXAM: CT ANGIOGRAPHY CHEST WITH CONTRAST  TECHNIQUE: Multidetector CT imaging of the chest was performed using the standard protocol during bolus administration of intravenous contrast. Multiplanar CT image reconstructions and MIPs were obtained to evaluate the vascular anatomy.  CONTRAST:  191mL OMNIPAQUE IOHEXOL 300 MG/ML  SOLN  COMPARISON:  None.  FINDINGS: The lungs are well aerated bilaterally without focal infiltrate or sizable effusion. No parenchymal mass lesion is seen. The thoracic aorta and its branches are within normal limits. Prominence of the ascending aorta to 4 cm is noted. No dissection is identified.  Normal tapering is noted distally. The pulmonary artery is within normal limits without evidence of filling defect to suggest pulmonary embolism. No significant hilar or mediastinal adenopathy is noted.  The thoracic inlet shows evidence of lymphadenopathy similar to that seen on the recent CT of the neck. The dominant lymph node is noted adjacent to the left common carotid artery best seen on image number 31 of series 406. It measures 2.9 x 2.0 cm in dimension.  The visualized upper abdomen shows evidence of cholelithiasis. Area decreased attenuation is noted within the liver adjacent to the falciform ligament consistent with focal fatty infiltration. No acute bony abnormality is noted.  Review of the MIP images confirms the  above findings.  IMPRESSION: No evidence of pulmonary embolism.  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 Recommend annual imaging followup by CTA or MRA. This recommendation follows 2010 ACCF/AHA/AATS/ACR/ASA/SCA/SCAI/SIR/STS/SVM Guidelines for the Diagnosis and Management of Patients with Thoracic Aortic Disease. Circulation. 2010; 121: O841-Y606  Left supraclavicular adenopathy.  Cholelithiasis.   Electronically Signed   By: Inez Catalina M.D.   On: 03/15/2015 09:18   Medications: I have reviewed the patient's current medications. Scheduled Meds: . enoxaparin (LOVENOX) injection  40 mg Subcutaneous Q24H  . folic acid  1 mg Oral Daily  . [START ON 03/17/2015] Influenza vac split quadrivalent PF  0.5 mL Intramuscular Tomorrow-1000  . LORazepam  0-4 mg Intravenous Q6H   Followed by  . [START ON 03/17/2015] LORazepam  0-4 mg Intravenous Q12H  . magnesium sulfate 1 - 4 g bolus IVPB  2 g Intravenous Once  . multivitamin with minerals  1 tablet Oral Daily  . nicotine  21 mg Transdermal Daily  . potassium chloride  10 mEq Intravenous Q1 Hr x 2  . thiamine  100 mg Oral Daily   Continuous Infusions:  PRN Meds:.acetaminophen **OR** acetaminophen, albuterol, LORazepam **OR** LORazepam,  polyethylene glycol Assessment/Plan: Principal Problem:   Neck mass Active Problems:   HTN (hypertension)   Tobacco abuse   Alcohol abuse   Alcohol withdrawal seizure   Prolonged Q-T interval on ECG   Aortic dilatation   Thyroid nodule   Lymphadenopathy   Hypokalemia   B12 deficiency   Marijuana abuse   Folate deficiency   Neck mass: Patient has 7+ month history of worsening dysphagia, hoarseness, weight loss, recent-onset hemoptosis, and b/l neck masses in setting of tobacco abuse. Patient cachetic on exam. Neck CT significant for extensive circumferential mass in the hypopharyngeal/supraglottic region and multiple enlarged necrotic lymph nodes consistent with metastatic involvement. Largest nodal mass is at right level 2 conglomeration measuring 5.2 x 3.8 x 3.5. Also complaining of thoracic back pain possibly 2/2 malignant disease, though no spinal tenderness appreciated on exam and no bony involvement appreciated on CT neck. Bilateral ear pain likely 2/2 mass effect vs dental carries given extremely poor dentition and patient's reported history of self-inflicted tooth extraction on right. Patient believes the process is likely malignant. Diffuse cervical adenopathy noted on physical exam and left supraclavicular adenopathy reported on CT chest, though not appreciated on examination. Leukocytosis now resolved (12.5-->10.3). 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 Lesion considered non-surgical with low overall cure probability. Flexible laryngoscope demonstrated submucosal mass pressing the soft palate anteriorly on the RIGHT side. Oropharynx shows bulky tumor mass which is ulcerated and bleeds with manipulation. Difficult to assess the entire extent of the mass. Hypopharynx again shows tumor mass. Larynx shows a juvenile epiglottis. 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 If patient desires therapy, will require biopsy under anesthesia and tracheostomy for airway protection. Tracheostomy may require patient to remain in-house for additional 4-5 days post-procedure to allow for acclimation. Also recommends G tube for nutrition. Biopsy could be by IR vs General surgery depending on findings from ENT-conducted panendoscopy. However, per Dr. [name], a tracheostomy will be required prior to panendoscopy. Patient will be discussed at tumor conference on Wednesday. Patient is requesting to have 5-7 days out of the hospital to "settle some things" and is undecided wrt if he wants a G-tube. Will attempt to  have medical/radiation oncology consult today to help patient assess next steps.  - f/u social work for [employer] - c/s Medical oncology - c/s radiation oncology - D5/NS @ 100cc/hour x10 hours for fluid resuscitation in setting of h/o poor intake -   HTN: Patient reports having been told he has elevated BP previously but has not followed up. Elevated BP with SBP in 150s on admission, high 159/88.  - continue to monitor VS  Tobacco abuse: Patient reports 35 year pack-year history, smoking 1PPD increased to 2PPD in recent weeks. Also endorses chewing tobacco for 10 years as adolescent. Tobacco use indicates neck mass may be 2/2 squamous cell carcinoma. Nl CXR, no evidence of intraparenchymal process/malignancy.  - nicotine patch  Alcohol abuse: Patient reports 3 (or more) drinks daily. Last drink was 4/2 @10pm . Has history of alcohol withdrawal symptoms including tremors and a witnessed seizure (per sister) 5-6 years ago. Albumin (3.4) and potassium (3.0) low potentially indicating malnutrition in setting of excessive alcohol intake. Reports h/o low B12 possibly associated with neuropathy for which he received injections 4-6 years ago. B12 480 (wnl) and folate 3.2 (low), potassium increased from 3.0 to 3.4 (low). Required  one dose of lorazepam overnight. - follow-up prealbumin -CIWA protocol - replete folate (switched from PO to IV)  Prolonged QT: likely 2/2 hypokalemia, patient not taking any drugs at home associated with prolonged QT. Mg (1.6)/Phos (3.5) wnl -replete potassium, now 3.4 from 3.0 - replete magnesium   Aortic dilation: Ascending aortic dilation to 4cm noted on CT chest.  - follow-up annual CTA or MRA, per guidelines.  Diet: NPO for possible biopsy DVT Ppx: Lovenox 40mg  Code: Full  Dispo: Disposition is deferred at this time, awaiting improvement of current medical problems. Anticipated discharge in approximately 2 day(s).   The patient does not have a current PCP (No Pcp Per Patient) and does need an [facility] hospital follow-up appointment after discharge.  The patient does not have transportation limitations that hinder transportation to clinic appointments.  .Services Needed at time of discharge: Y = Yes, Blank = No PT:   OT:   RN:   Equipment:   Other:     LOS: 1 day   Carolan Shiver, Med Student 03/16/2015, 10:07 AM

## 2015-03-16 NOTE — Care Management Note (Unsigned)
    Page 1 of 1   03/17/2015     3:11:58 PM CARE MANAGEMENT NOTE 03/17/2015  Patient:  Kyle Alexander, Kyle Alexander   Account Number:  [id number]  Date Initiated:  03/16/2015  Documentation initiated by:  Lorne Skeens  Subjective/Objective Assessment:   Patient was admitted with head and neck cancer, placed on CIWA for ETOH withdrawal.  Patient currently lives in a motel and is uninsured.     Action/Plan:   Will follow for discharge needs.   Anticipated DC Date:  03/17/2015   Anticipated DC Plan:  HOME/SELF CARE  In-house referral  Clinical Social Worker  Advertising account planner  CM consult  [facility]  Medication Assistance      Choice offered to / List presented to:             Status of service:  Completed, signed off Medicare Important Message given?   (If response is "NO", the following Medicare IM given date fields will be blank) Date Medicare IM given:   Medicare IM given by:   Date Additional Medicare IM given:   Additional Medicare IM given by:    Discharge Disposition:    Per UR Regulation:  Reviewed for med. necessity/level of care/duration of stay  If discussed at [place] of Stay Meetings, dates discussed:    Comments:  4/5 14:30 Spoke with mr Frid about [facility]. given pamphlet, intructed to arrive at 08:30 as a walk in to establish himself as a new patient, after that point he can use their pharmacy. pt understands to bring his discharge papers and rx with him. also given coupons for ensure. Carles Collet RN BSN CM  03/16/15 [place] RN, MSN, CM- Per chart, patient is being followed by Development worker, community for lack of insurance.

## 2015-03-16 NOTE — Progress Notes (Signed)
SLP Cancellation Note  Patient Details Name: Kyle Alexander MRN: [id number] DOB: June 28, 1961   Cancelled treatment:       Reason Eval/Treat Not Completed: Medical issues which prohibited therapy. Discussed with RN, pt NPO pending biopsy - will return as able for swallowing evaluation.    Germain Osgood, M.A. CCC-SLP [phone]  Germain Osgood 03/16/2015, 3:44 PM

## 2015-03-16 NOTE — Progress Notes (Signed)
Subjective:  Pt seen and examined in AM. No acute events overnight. He slept well last night and was able to eat oatmeal and grits without difficulty. He would rather try to eat then have g-tube placed.    He states that he would prefer to have his medical issues discussed with him directly and not in the presence of his sister this morning. He states understanding of the possible treatments and prognosis for his condition. He states that has several issues to settle including establishing his power of attorney. He states that he does not want his siblings to fulfill this role for him.   Objective: Vital signs in last 24 hours: Filed Vitals:   03/15/15 2256 03/16/15 0115 03/16/15 0500 03/16/15 0617  BP:  144/101  134/84  Pulse:  86  88  Temp:  97.7 F (36.5 C)  98.4 F (36.9 C)  TempSrc:  Oral  Oral  Resp:  18  20  Height:      Weight:   120 lb 8 oz (54.658 kg)   SpO2: 98% 100%  100%   Weight change:  No intake or output data in the 24 hours ending 03/16/15 0853  General: NAD, cachectic appearing  HEENT: Right and left fixed, non-tender, immboile golf-sized masses, left sided pharyngeal deviation. Bilateral cervical lymphadenopathy. Few teeth.  Lungs: Clear to ausculation bilaterally. No wheezing, rhonchi, or rales.  Heart: Normal rate and rhythm  Extremities: No edema Neuro: A& O x 3.    Lab Results: Basic Metabolic Panel:  Recent Labs Lab 03/15/15 0740 03/15/15 0752 03/15/15 1330 03/16/15 0605  NA 134* 135  --  134*  K 3.5 3.0*  --  3.4*  CL 92* 91*  --  97  CO2 32  --   --  28  GLUCOSE 100* 101*  --  127*  BUN 10 11  --  <5*  CREATININE 0.75 0.80  --  0.60  CALCIUM 10.0  --   --  9.1  MG  --   --  1.6  --   PHOS  --   --  3.5  --    Liver Function Tests:  Recent Labs Lab 03/15/15 0740  AST 26  ALT 15  ALKPHOS 76  BILITOT 0.6  PROT 8.5*  ALBUMIN 3.4*   CBC:  Recent Labs Lab 03/15/15 0740 03/15/15 0752 03/16/15 0605  WBC 12.5*  --   10.3  NEUTROABS 10.0*  --   --   HGB 14.6 17.0 11.8*  HCT 41.8 50.0 34.4*  MCV 95.9  --  95.6  PLT 200  --  180   Thyroid Function Tests:  Recent Labs Lab 03/15/15 1330  TSH 1.111   Coagulation:  Recent Labs Lab 03/15/15 1330  LABPROT 14.2  INR 1.08   Anemia Panel:  Recent Labs Lab 03/15/15 1330  VITAMINB12 480  FOLATE 3.2*   Urine Drug Screen: Drugs of Abuse     Component Value Date/Time   LABOPIA NONE DETECTED 03/15/2015 1533   COCAINSCRNUR NONE DETECTED 03/15/2015 1533   LABBENZ NONE DETECTED 03/15/2015 1533   AMPHETMU NONE DETECTED 03/15/2015 1533   THCU POSITIVE* 03/15/2015 1533   LABBARB NONE DETECTED 03/15/2015 1533    Alcohol Level:  Recent Labs Lab 03/15/15 1330  ETH <5   Urinalysis:  Recent Labs Lab 03/15/15 1533  COLORURINE AMBER*  LABSPEC >1.046*  PHURINE 5.5  GLUCOSEU NEGATIVE  HGBUR NEGATIVE  BILIRUBINUR SMALL*  KETONESUR 40*  PROTEINUR 30*  UROBILINOGEN 2.0*  NITRITE NEGATIVE  LEUKOCYTESUR NEGATIVE     Micro Results: Recent Results (from the past 240 hour(s))  MRSA PCR Screening     Status: None   Collection Time: 03/15/15 10:56 AM  Result Value Ref Range Status   MRSA by PCR NEGATIVE NEGATIVE Final    Comment:        The GeneXpert MRSA Assay (FDA approved for NASAL specimens only), is one component of a comprehensive MRSA colonization surveillance program. It is not intended to diagnose MRSA infection nor to guide or monitor treatment for MRSA infections.    Studies/Results: Dg Chest 2 View  03/15/2015   CLINICAL DATA:  Chest pain, cough  EXAM: CHEST  2 VIEW  COMPARISON:  None.  FINDINGS: The heart size and mediastinal contours are within normal limits. Both lungs are clear. The visualized skeletal structures are unremarkable.  IMPRESSION: No active cardiopulmonary disease.   Electronically Signed   By: Inez Catalina M.D.   On: 03/15/2015 08:14   Ct Soft Tissue Neck W Contrast  03/15/2015   CLINICAL DATA:  Weight  loss. Neck mass. Swelling. Short of breath.  EXAM: CT NECK WITH CONTRAST  TECHNIQUE: Multidetector CT imaging of the neck was performed using the standard protocol following the bolus administration of intravenous contrast.  CONTRAST:  129mL OMNIPAQUE IOHEXOL 300 MG/ML  SOLN  COMPARISON:  None.  FINDINGS: I think there is an extensive circumferential malignancy in the hypo pharyngeal/supraglottic region. This is difficult to measure precisely because of a superficial spreading pattern. The most bulky tumor is in the right supraglottic region.  There are multiple enlarged necrotic lymph nodes consistent with metastatic involvement. This includes retropharyngeal and parapharyngeal nodes, level 2 nodes bilaterally, level 3 and left-sided level for lymph nodes. The largest nodal mass is a a right level 2 conglomeration measuring 5.2 x 3.8 x 3.5 cm in diameter.  Parotid glands are normal. Submandibular glands are normal. Thyroid gland contains a 13 mm cyst or nodule in the left lobe. Carotid arteries and jugular veins are patent, though of both jugular veins show extrinsic compression by large nodal masses.  No significant bony finding.  IMPRESSION: I think there is an advanced hypopharyngeal/ supraglottic mass, largely with a superficial spreading pattern. Massive bilateral necrotic metastatic lymphadenopathy.  I did consider other possible etiologies such as tuberculosis, but think that is less likely.   Electronically Signed   By: Nelson Chimes M.D.   On: 03/15/2015 09:11   Ct Angio Chest Pe W/cm &/or Wo Cm  03/15/2015   CLINICAL DATA:  Weight loss with hemoptysis  EXAM: CT ANGIOGRAPHY CHEST WITH CONTRAST  TECHNIQUE: Multidetector CT imaging of the chest was performed using the standard protocol during bolus administration of intravenous contrast. Multiplanar CT image reconstructions and MIPs were obtained to evaluate the vascular anatomy.  CONTRAST:  117mL OMNIPAQUE IOHEXOL 300 MG/ML  SOLN  COMPARISON:  None.   FINDINGS: The lungs are well aerated bilaterally without focal infiltrate or sizable effusion. No parenchymal mass lesion is seen. The thoracic aorta and its branches are within normal limits. Prominence of the ascending aorta to 4 cm is noted. No dissection is identified. Normal tapering is noted distally. The pulmonary artery is within normal limits without evidence of filling defect to suggest pulmonary embolism. No significant hilar or mediastinal adenopathy is noted.  The thoracic inlet shows evidence of lymphadenopathy similar to that seen on the recent CT of the neck. The dominant lymph node is noted adjacent to the left common  carotid artery best seen on image number 31 of series 406. It measures 2.9 x 2.0 cm in dimension.  The visualized upper abdomen shows evidence of cholelithiasis. Area decreased attenuation is noted within the liver adjacent to the falciform ligament consistent with focal fatty infiltration. No acute bony abnormality is noted.  Review of the MIP images confirms the above findings.  IMPRESSION: No evidence of pulmonary embolism.  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 Recommend annual imaging followup by CTA or MRA. This recommendation follows 2010 ACCF/AHA/AATS/ACR/ASA/SCA/SCAI/SIR/STS/SVM Guidelines for the Diagnosis and Management of Patients with Thoracic Aortic Disease. Circulation. 2010; 121: W299-B716  Left supraclavicular adenopathy.  Cholelithiasis.   Electronically Signed   By: Inez Catalina M.D.   On: 03/15/2015 09:18   Medications: I have reviewed the patient's current medications. Scheduled Meds: . enoxaparin (LOVENOX) injection  40 mg Subcutaneous Q24H  . folic acid  1 mg Oral Daily  . [START ON 03/17/2015] Influenza vac split quadrivalent PF  0.5 mL Intramuscular Tomorrow-1000  . LORazepam  0-4 mg Intravenous Q6H   Followed by  . [START ON 03/17/2015] LORazepam  0-4 mg Intravenous Q12H  . magnesium sulfate 1 - 4 g bolus IVPB  2 g Intravenous Once  . multivitamin  with minerals  1 tablet Oral Daily  . nicotine  21 mg Transdermal Daily  . potassium chloride  10 mEq Intravenous Q1 Hr x 2  . 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 Currently with no respiratory compromise. Flexible laryngoscope was performed by ENT this morning.  -Appreciate ENT recommendations -Plan for IR guided US biopsy today without sedation -To consult today with radiation-oncology and medical oncology for discussion of management options   -Follow-up prealbumin and vitamin D level to assess nutritional status  -Awaiting dietician consult, start ensure 1 can TID between meals  -Oxygen therapy as needed to keep SpO2 >92% -Albuterol nebulizer Q 4 hr PRN wheezing  -SW consult for discussion of HCPO  Hypokalemia - K 3.4 with Mg level of 1.6 this AM. Pt with chronic hypokalemia in setting of alcohol abuse. No recent GI losses. -Monitor Mg level, replete with IV magnesium sulfate 2 g as necessary  -IV KCl 10 mEq x2 runs  -Monitor BMP  Left Thyroid Nodule - Pt with 13 mm cyst or nodule in the left thyroid lobe. TSH level normal. -Continue to monitor   Hypertension - Currently normotensive. Pt not on antihypertensives at home. -Hold anti-hypertensives in setting of volume depletion and syncope -Continue to monitor   Tobacco Abuse - Pt with 1-2 pack history for approx past 30 years.   -Place nicotine 21 mg patch daily  -Pt counseled on tobacco cessation   Alcohol Abuse  - Reports last drink 1 day ago and drinks few beers weekly. History of remote alcohol-withdrawal seizures. Ethanol level was negative on admission.  -Continue CIWA protocol   Folate Deficiency - Folate level low at 3.2.  -  Change PO folate 1 mg daily to IV folate 1 mg daily   Marijuana Abuse - Pt reports smoking THC 1-2 times weekly and UDS positive for THC.  -Pt counseled on cessation   History of B12 deficiency - Per sister was on IM injections in the past with imbalance and paraesthesias. B12 level normal (480) on admission.    Thoracic Aortic Dilation - 4 cm ascending aortic dilation seen on CTA chest. -Needs follow-up CTA or MRA in 1 year  Prolonged QT - EKG 470 in setting of hypokalemia and alcohol abuse.  -Avoid prolonging QT medications  -Replete potassium and magnesium   Hepatitis C and HIV Ab testing - No prior testing.  -Obtain HIV and HCV Ab   Diet: NPO DVT Ppx: Hold Lovenox for biopsy   Code: Full   Dispo: Disposition is deferred at this time, awaiting improvement of current medical problems. Anticipated discharge in approximately 3-4 day(s).   The patient does not have a current PCP (No Pcp Per Patient) and does need an [facility] hospital follow-up appointment after discharge.  The patient does not have transportation limitations that hinder transportation to clinic appointments.  .Services Needed at time of discharge: Y = Yes, Blank = No PT:   OT:   RN:   Equipment:   Other:     LOS: 1 day

## 2015-03-16 NOTE — Progress Notes (Signed)
03/16/2015 1:41 PM  Jeri Lager [id number]  Hosp  Day 2    Temp:  [97.7 F (36.5 C)-98.8 F (37.1 C)] 98.4 F (36.9 C) (04/04 0956) Pulse Rate:  [83-98] 98 (04/04 0956) Resp:  [18-20] 20 (04/04 0956) BP: (114-144)/(79-101) 128/85 mmHg (04/04 0956) SpO2:  [98 %-100 %] 100 % (04/04 0956) Weight:  [54.613 kg (120 lb 6.4 oz)-54.658 kg (120 lb 8 oz)] 54.658 kg (120 lb 8 oz) (04/04 0500),    No intake or output data in the 24 hours ending 03/16/15 1341  Results for orders placed or performed during the hospital encounter of 03/15/15 (from the past 24 hour(s))  Urinalysis, Routine w reflex microscopic     Status: Abnormal   Collection Time: 03/15/15  3:33 PM  Result Value Ref Range   Color, Urine AMBER (A) YELLOW   APPearance CLEAR CLEAR   Specific Gravity, Urine >1.046 (H) 1.005 - 1.030   pH 5.5 5.0 - 8.0   Glucose, UA NEGATIVE NEGATIVE mg/dL   Hgb urine dipstick NEGATIVE NEGATIVE   Bilirubin Urine SMALL (A) NEGATIVE   Ketones, ur 40 (A) NEGATIVE mg/dL   Protein, ur 30 (A) NEGATIVE mg/dL   Urobilinogen, UA 2.0 (H) 0.0 - 1.0 mg/dL   Nitrite NEGATIVE NEGATIVE   Leukocytes, UA NEGATIVE NEGATIVE  Urine rapid drug screen (hosp performed)     Status: Abnormal   Collection Time: 03/15/15  3:33 PM  Result Value Ref Range   Opiates NONE DETECTED NONE DETECTED   Cocaine NONE DETECTED NONE DETECTED   Benzodiazepines NONE DETECTED NONE DETECTED   Amphetamines NONE DETECTED NONE DETECTED   Tetrahydrocannabinol POSITIVE (A) NONE DETECTED   Barbiturates NONE DETECTED NONE DETECTED  Urine microscopic-add on     Status: None   Collection Time: 03/15/15  3:33 PM  Result Value Ref Range   WBC, UA 0-2 <3 WBC/hpf   Bacteria, UA RARE RARE  CBC     Status: Abnormal   Collection Time: 03/16/15  6:05 AM  Result Value Ref Range   WBC 10.3 4.0 - 10.5 K/uL   RBC 3.60 (L) 4.22 - 5.81 MIL/uL   Hemoglobin 11.8 (L) 13.0 - 17.0 g/dL   HCT 34.4 (L) 39.0 - 52.0 %   MCV 95.6 78.0 - 100.0 fL   MCH  32.8 26.0 - 34.0 pg   MCHC 34.3 30.0 - 36.0 g/dL   RDW 14.8 11.5 - 15.5 %   Platelets 180 150 - 400 K/uL  Basic metabolic panel     Status: Abnormal   Collection Time: 03/16/15  6:05 AM  Result Value Ref Range   Sodium 134 (L) 135 - 145 mmol/L   Potassium 3.4 (L) 3.5 - 5.1 mmol/L   Chloride 97 96 - 112 mmol/L   CO2 28 19 - 32 mmol/L   Glucose, Bld 127 (H) 70 - 99 mg/dL   BUN <5 (L) 6 - 23 mg/dL   Creatinine, Ser 0.60 0.50 - 1.35 mg/dL   Calcium 9.1 8.4 - 10.5 mg/dL   GFR calc non Af Amer >90 >90 mL/min   GFR calc Af Amer >90 >90 mL/min   Anion gap 9 5 - 15    SUBJECTIVE:  No change in symptoms  OBJECTIVE:    No change in exam  IMPRESSION:  Stable check  PLAN:  Will attempt percutaneous FNA for cytology to establish diagnosis without threat to airway.  Will discuss at Tumor Board on Wednesday.  Dr. Isidore Moos favors starting curative/palliative RT as  soon as possible.  Pt reluctant to have G-tube placed.    [name]

## 2015-03-17 DIAGNOSIS — E46 Unspecified protein-calorie malnutrition: Secondary | ICD-10-CM

## 2015-03-17 DIAGNOSIS — C139 Malignant neoplasm of hypopharynx, unspecified: Principal | ICD-10-CM

## 2015-03-17 DIAGNOSIS — F1410 Cocaine abuse, uncomplicated: Secondary | ICD-10-CM

## 2015-03-17 DIAGNOSIS — E559 Vitamin D deficiency, unspecified: Secondary | ICD-10-CM | POA: Diagnosis present

## 2015-03-17 DIAGNOSIS — I77810 Thoracic aortic ectasia: Secondary | ICD-10-CM

## 2015-03-17 LAB — BASIC METABOLIC PANEL
ANION GAP: 3 — AB (ref 5–15)
BUN: <5 mg/dL — ABNORMAL LOW (ref 6–23)
CALCIUM: 9.2 mg/dL (ref 8.4–10.5)
CO2: 33 mmol/L — ABNORMAL HIGH (ref 19–32)
Chloride: 99 mmol/L (ref 96–112)
Creatinine, Ser: 0.58 mg/dL (ref 0.50–1.35)
GFR calc Af Amer: >90 mL/min (ref >90)
GFR calc non Af Amer: >90 mL/min (ref >90)
Glucose, Bld: 105 mg/dL — ABNORMAL HIGH (ref 70–99)
Potassium: 3.2 mmol/L — ABNORMAL LOW (ref 3.5–5.1)
SODIUM: 135 mmol/L (ref 135–145)

## 2015-03-17 LAB — CBC
HCT: 36.9 % — ABNORMAL LOW (ref 39.0–52.0)
Hemoglobin: 12.4 g/dL — ABNORMAL LOW (ref 13.0–17.0)
MCH: 32.7 pg (ref 26.0–34.0)
MCHC: 33.6 g/dL (ref 30.0–36.0)
MCV: 97.4 fL (ref 78.0–100.0)
Platelets: 185 10*3/uL (ref 150–400)
RBC: 3.79 MIL/uL — ABNORMAL LOW (ref 4.22–5.81)
RDW: 14.9 % (ref 11.5–15.5)
WBC: 11.1 10*3/uL — AB (ref 4.0–10.5)

## 2015-03-17 LAB — PREALBUMIN: Prealbumin: 11 mg/dL (ref 18.0–45.0)

## 2015-03-17 LAB — MAGNESIUM: Magnesium: 1.8 mg/dL (ref 1.5–2.5)

## 2015-03-17 LAB — VITAMIN D 25 HYDROXY (VIT D DEFICIENCY, FRACTURES)

## 2015-03-17 MED ORDER — ERGOCALCIFEROL 8000 UNIT/ML PO SOLN
50000.0000 [IU] | ORAL | Status: DC
  Administered 2015-03-17: 50000 [IU] via ORAL
  Filled 2015-03-17: qty 6.25

## 2015-03-17 MED ORDER — BOOST / RESOURCE BREEZE PO LIQD: 1.0000 | Freq: Three times a day (TID) | ORAL | Status: DC

## 2015-03-17 MED ORDER — ERGOCALCIFEROL 8000 UNIT/ML PO SOLN
50000.0000 [IU] | ORAL | Status: DC
Start: 2015-03-24 — End: 2015-09-02

## 2015-03-17 MED ORDER — POTASSIUM CHLORIDE 10 MEQ/100ML IV SOLN
10.0000 meq | INTRAVENOUS | Status: AC
  Administered 2015-03-17 (×2): 10 meq via INTRAVENOUS
  Filled 2015-03-17 (×2): qty 100

## 2015-03-17 NOTE — Progress Notes (Signed)
Chaplain responded to spiritual care consult for advanced directive. Pt concerned about some of his property, finances, ect. Chaplain distinguished between this kind of will and an advanced directive. Pt remains very interested in advanced directive, as he currently has cancer. Chaplain described contents of advanced directive packet and answered pt questions. Pt to look over packet and intends to bring it upon his next admission. Page chaplain as needed.    03/17/15 0900  Clinical Encounter Type  Visited With Family  Visit Type Initial;Spiritual support  Referral From Social work  Stress Factors  Patient Stress Factors Family relationships;Health changes;Financial concerns  Regulatory affairs officer (For Healthcare)  Does patient have an advance directive? No  Would patient like information on creating an advanced directive? Yes - Educational materials given  Marcelino Scot 03/17/2015 9:29 AM

## 2015-03-17 NOTE — Discharge Instructions (Signed)
You have follow-up with Dr. Isidore Moos on April 8th (Friday) at [facility].   Please take feeding supplement ([employer]).  Follow dietary instructions as provided in this packet. Remain upright for 30-60 minutes after eating.  Return to the ER immediately if you are coughing up blood or are unable to swallow.

## 2015-03-17 NOTE — Progress Notes (Signed)
Subjective: Patient reports having had a bad night last night with numerous secretion choking episodes. The nurses attempted intervention with suction but minimal relief. Patient endorses some soreness over the biopsy site, but denies pain. Also endorses diarrhea with some belly pain yesterday but attributes to dairy. Denies any hemoptosis.   Objective: Vital signs in last 24 hours: Filed Vitals:   03/17/15 0130 03/17/15 0158 03/17/15 0654 03/17/15 0958  BP: 119/85 128/86 114/83 125/92  Pulse: 79 80 72 87  Temp:   98.3 F (36.8 C) 99.6 F (37.6 C)  TempSrc:   Oral Oral  Resp: 20  18 20   Height:      Weight:      SpO2: 100% 100% 100% 100%   General: resting in chair HEENT: PERRL, EOMI, no scleral icterus, unchanged bilateral nonmobile, nontender neck masses Cardiac: RRR, no rubs, murmurs or gallops Pulm: clear to auscultation bilaterally, moving normal volumes of air Abd: soft, nontender, nondistended, BS present Ext: warm and well perfused, no pedal edema Neuro: alert and oriented X3, cranial nerves II-XII grossly intact  Lab Results: Basic Metabolic Panel:  Recent Labs  03/15/15 1330 03/16/15 0605 03/17/15 0817  NA  --  134* 135  K  --  3.4* 3.2*  CL  --  97 99  CO2  --  28 33*  GLUCOSE  --  127* 105*  BUN  --  <5* <5*  CREATININE  --  0.60 0.58  CALCIUM  --  9.1 9.2  MG 1.6  --  1.8  PHOS 3.5  --   --    Liver Function Tests:  Recent Labs  03/15/15 0740  AST 26  ALT 15  ALKPHOS 76  BILITOT 0.6  PROT 8.5*  ALBUMIN 3.4*  CBC:  Recent Labs  03/15/15 0740  03/16/15 0605 03/17/15 0817  WBC 12.5*  --  10.3 11.1*  NEUTROABS 10.0*  --   --   --   HGB 14.6  < > 11.8* 12.4*  HCT 41.8  < > 34.4* 36.9*  MCV 95.9  --  95.6 97.4  PLT 200  --  180 185  < > = values in this interval not displayed. Thyroid Function Tests:  Recent Labs  03/15/15 1330  TSH 1.111   Anemia Panel:  Recent Labs  03/15/15 1330  VITAMINB12 480  FOLATE 3.2*    Coagulation:  Recent Labs  03/15/15 1330  LABPROT 14.2  INR 1.08   Urine Drug Screen: Drugs of Abuse     Component Value Date/Time   LABOPIA NONE DETECTED 03/15/2015 1533   COCAINSCRNUR NONE DETECTED 03/15/2015 1533   LABBENZ NONE DETECTED 03/15/2015 1533   AMPHETMU NONE DETECTED 03/15/2015 1533   THCU POSITIVE* 03/15/2015 1533   LABBARB NONE DETECTED 03/15/2015 1533    Alcohol Level:  Recent Labs  03/15/15 1330  ETH <5   Urinalysis:  Recent Labs  03/15/15 1533  COLORURINE AMBER*  LABSPEC >1.046*  PHURINE 5.5  GLUCOSEU NEGATIVE  HGBUR NEGATIVE  BILIRUBINUR SMALL*  KETONESUR 40*  PROTEINUR 30*  UROBILINOGEN 2.0*  NITRITE NEGATIVE  LEUKOCYTESUR NEGATIVE   Micro Results: Recent Results (from the past 240 hour(s))  MRSA PCR Screening     Status: None   Collection Time: 03/15/15 10:56 AM  Result Value Ref Range Status   MRSA by PCR NEGATIVE NEGATIVE Final    Comment:        The GeneXpert MRSA Assay (FDA approved for NASAL specimens only), is one component of  a comprehensive MRSA colonization surveillance program. It is not intended to diagnose MRSA infection nor to guide or monitor treatment for MRSA infections.    Studies/Results: US Biopsy  03/16/2015   CLINICAL DATA:  54 year old with probable head and neck cancer. Bilateral cervical lymphadenopathy. Tissue diagnosis is needed.  EXAM: ULTRASOUND-GUIDED BIOPSY OF LEFT CERVICAL LYMPH NODES  Physician: Stephan Minister. Anselm Pancoast, MD  FLUOROSCOPY TIME:  None  MEDICATIONS: None  ANESTHESIA/SEDATION: Moderate sedation time: None  PROCEDURE: The procedure was explained to the patient. The risks and benefits of the procedure were discussed and the patient's questions were addressed. Informed consent was obtained from the patient. Ultrasound demonstrated multiple large left cervical lymph nodes. Left side of the neck was prepped and draped in sterile fashion. Skin was anesthetized with 1% lidocaine. Using ultrasound guidance,  4 core biopsies were obtained from a large left cervical lymph node in the upper neck with an 18 gauge device. Specimens were placed in formalin and saline. The core material appeared to be very necrotic and scant. As a result, 4 ultrasound-guided fine-needle aspirations were obtained from additional left cervical lymph nodes with 25 gauge needles. Bandages placed at the puncture sites.  FINDINGS: Multiple enlarged cervical lymph nodes on the left side. Dominant lesion in the left upper cervical lymph node was sampled with the core device. This dominant lymph node measures at least 2.6 cm. FNAs obtained from an adjacent left upper cervical lymph node and a left supraclavicular lymph node.  COMPLICATIONS: None  IMPRESSION: Ultrasound-guided core biopsies and fine-needle aspirations of left cervical lymph nodes.   Electronically Signed   By: Markus Daft M.D.   On: 03/16/2015 17:54   Medications: I have reviewed the patient's current medications. Scheduled Meds: . enoxaparin (LOVENOX) injection  40 mg Subcutaneous Q24H  . ergocalciferol  50,000 Units Oral Weekly  . feeding supplement (RESOURCE BREEZE)  1 Container Oral TID BM  . folic acid  1 mg Intravenous Daily  . Influenza vac split quadrivalent PF  0.5 mL Intramuscular Tomorrow-1000  . LORazepam  0-4 mg Intravenous Q12H  . multivitamin  5 mL Oral Daily  . nicotine  21 mg Transdermal Daily  . potassium chloride  10 mEq Intravenous Q1 Hr x 2  . thiamine IV  100 mg Intravenous Daily   Continuous Infusions: . dextrose 5 % and 0.45% NaCl 75 mL/hr at 03/17/15 0257   PRN Meds:.acetaminophen **OR** acetaminophen, albuterol, LORazepam **OR** LORazepam, ondansetron (ZOFRAN) IV, polyethylene glycol Assessment/Plan: Principal Problem:   Neck mass Active Problems:   HTN (hypertension)   Tobacco abuse   Alcohol abuse   Alcohol withdrawal seizure   Prolonged Q-T interval on ECG   Aortic dilatation   Thyroid nodule   Lymphadenopathy   Hypokalemia    B12 deficiency   Marijuana abuse   Folate deficiency   Protein-calorie malnutrition, severe   Vitamin D deficiency   Neck mass: Patient has 7+ month history of worsening dysphagia, hoarseness, weight loss, recent-onset hemoptosis, and b/l neck masses in setting of tobacco abuse. Patient cachetic on exam. Neck CT significant for extensive circumferential mass in the hypopharyngeal/supraglottic region and multiple enlarged necrotic lymph nodes consistent with metastatic involvement. Lesion considered non-surgical with low overall cure probability. Flexible laryngoscope demonstrated submucosal mass pressing the soft palate anteriorly on the RIGHT side. Oropharynx shows bulky tumor mass which is ulcerated and bleeds with manipulation. Patient underwent ultrasound-guided biopsy on 4/4 of cervical lymph nodes (core) and supraclavicular nodes (FNA). Patient will be discussed at tumor  conference on 4/6. Per radiation oncology recommendations, if SCCa is confirmed, radiation-only approach (7 weeks, 5x/wk) would be appropriate. Patient would benefit from PEG tube placement prior to initiation of radiation. Medical oncology has also evaluated the patient and agrees that concomitant chemotherapy and radiation would be excessively toxic. An alternative would be induction chemo and radiation. Patient is amenable to PEG tube placement "at some point" but does not want to get it immediately. Speech therapy identified his aspiration risk as moderate. Patient is planning to leave on 4/5 to "take care of some things" but agrees to follow-up within 48 hours. Patient counseled on importance of following up as well as the risks associated with aspiration. PET scan scheduled 4/15 at 1pm, patient has to be NPO for 6 hours prior to exam @ [facility] - IR c/s re: possible PEG tube placement as outpatient, appreciate recommendations - scheduling follow-up appointment in multi-disciplinary Head/Neck clinic - D5/NS @ 100cc/hour   hours for fluid resuscitation in setting of h/o poor intake  Advanced directives: Patient seen by by palliative care and chaplain and provided information regarding setting up HCPOA. Palliative team evaluated, patient expresses desire to be full code without prolonged ventilation or ICU care.   Nutrition: Malnourished with 30-40 pound weight loss. All medications should be liquid - Mechanical soft diet recommended by speech therapy. - patient counseled on Slow rate;Small sips/bites;Multiple dry swallows after each bite/sip;Follow solids with liquid; Seated upright 90 degrees;Upright 30-60 min after meal  - Ensure supplementation - case management c/s to confirm access  HTN: Patient reports having been told he has elevated BP previously but has not followed up. Elevated BP with SBP in 150s on admission, high 159/88.  - continue to monitor VS  Tobacco abuse: Patient reports 35 year pack-year history, smoking 1PPD increased to 2PPD in recent weeks. Also endorses chewing tobacco for 10 years as adolescent. Tobacco use indicates neck mass may be 2/2 squamous cell carcinoma. Nl CXR, no evidence of intraparenchymal process/malignancy.  - nicotine patch  Alcohol abuse: Patient reports 3 (or more) drinks daily. Last drink was 4/2 @10pm . Has history of alcohol withdrawal symptoms including tremors and a witnessed seizure (per sister) 5-6 years ago. Albumin (3.4) and potassium (3.0) low potentially indicating malnutrition in setting of excessive alcohol intake. Reports h/o low B12 possibly associated with neuropathy for which he received injections 4-6 years ago. B12 480 (wnl) and folate 3.2 (low), potassium increased from 3.0 to 3.4 (low). Required one dose of lorazepam 4/3. - follow-up prealbumin -CIWA protocol - replete folate (switched from PO to IV)  Prolonged QT: likely 2/2 hypokalemia, patient not taking any drugs at home associated with prolonged QT. Mg (1.8)/Phos (3.5) wnl -replete  potassium, on 4/5 was 3.2 from 3.0 on admission  Aortic dilation: Ascending aortic dilation to 4cm noted on CT chest.  - follow-up annual CTA or MRA, per guidelines.  Diet: Mechanical soft DVT Ppx: Lovenox 40mg  Code: Full  Dispo: Disposition is deferred at this time, awaiting improvement of current medical problems. Anticipated discharge in approximately 0 day(s).   The patient does not have a current PCP (No Pcp Per Patient) and does need an [facility] hospital follow-up appointment after discharge.  The patient does not have transportation limitations that hinder transportation to clinic appointments. .Services Needed at time of discharge: Y = Yes, Blank = No PT:   OT:   RN:   Equipment:   Other:     LOS: 2 days   Carolan Shiver, Med  Student 03/17/2015, 10:39 AM

## 2015-03-17 NOTE — Progress Notes (Signed)
Subjective:  Pt seen and examined in AM. He had FNA and core biopsy of left cervical lymph node yesterday with some neck pain that has since resolved. He had trouble sleeping last night due to choking. He also had abdominal pain and some loose stools that he believes was due to drinking milk.     Objective: Vital signs in last 24 hours: Filed Vitals:   03/17/15 0129 03/17/15 0130 03/17/15 0158 03/17/15 0654  BP:  119/85 128/86 114/83  Pulse:  79 80 72  Temp:    98.3 F (36.8 C)  TempSrc:    Oral  Resp:  20  18  Height:      Weight: 127 lb 14.4 oz (58.015 kg)     SpO2:  100% 100% 100%   Weight change: 7 lb 8 oz (3.402 kg) No intake or output data in the 24 hours ending 03/17/15 0749  General: NAD, cachectic appearing  HEENT: Right and left fixed, non-tender, immboile golf-sized masses, left sided pharyngeal deviation. Bilateral cervical lymphadenopathy. Few teeth.  Lungs: Clear to ausculation bilaterally. No wheezing, rhonchi, or rales.  Heart: Normal rate and rhythm  Extremities: No edema Neuro: A& O x 3.    Lab Results: Basic Metabolic Panel:  Recent Labs Lab 03/15/15 0740 03/15/15 0752 03/15/15 1330 03/16/15 0605  NA 134* 135  --  134*  K 3.5 3.0*  --  3.4*  CL 92* 91*  --  97  CO2 32  --   --  28  GLUCOSE 100* 101*  --  127*  BUN 10 11  --  <5*  CREATININE 0.75 0.80  --  0.60  CALCIUM 10.0  --   --  9.1  MG  --   --  1.6  --   PHOS  --   --  3.5  --    Liver Function Tests:  Recent Labs Lab 03/15/15 0740  AST 26  ALT 15  ALKPHOS 76  BILITOT 0.6  PROT 8.5*  ALBUMIN 3.4*   CBC:  Recent Labs Lab 03/15/15 0740 03/15/15 0752 03/16/15 0605  WBC 12.5*  --  10.3  NEUTROABS 10.0*  --   --   HGB 14.6 17.0 11.8*  HCT 41.8 50.0 34.4*  MCV 95.9  --  95.6  PLT 200  --  180   Thyroid Function Tests:  Recent Labs Lab 03/15/15 1330  TSH 1.111   Coagulation:  Recent Labs Lab 03/15/15 1330  LABPROT 14.2  INR 1.08   Anemia  Panel:  Recent Labs Lab 03/15/15 1330  VITAMINB12 480  FOLATE 3.2*   Urine Drug Screen: Drugs of Abuse     Component Value Date/Time   LABOPIA NONE DETECTED 03/15/2015 1533   COCAINSCRNUR NONE DETECTED 03/15/2015 1533   LABBENZ NONE DETECTED 03/15/2015 1533   AMPHETMU NONE DETECTED 03/15/2015 1533   THCU POSITIVE* 03/15/2015 1533   LABBARB NONE DETECTED 03/15/2015 1533    Alcohol Level:  Recent Labs Lab 03/15/15 1330  ETH <5   Urinalysis:  Recent Labs Lab 03/15/15 1533  COLORURINE AMBER*  LABSPEC >1.046*  PHURINE 5.5  GLUCOSEU NEGATIVE  HGBUR NEGATIVE  BILIRUBINUR SMALL*  KETONESUR 40*  PROTEINUR 30*  UROBILINOGEN 2.0*  NITRITE NEGATIVE  LEUKOCYTESUR NEGATIVE     Micro Results: Recent Results (from the past 240 hour(s))  MRSA PCR Screening     Status: None   Collection Time: 03/15/15 10:56 AM  Result Value Ref Range Status   MRSA by PCR NEGATIVE  NEGATIVE Final    Comment:        The GeneXpert MRSA Assay (FDA approved for NASAL specimens only), is one component of a comprehensive MRSA colonization surveillance program. It is not intended to diagnose MRSA infection nor to guide or monitor treatment for MRSA infections.    Studies/Results: Dg Chest 2 View  03/15/2015   CLINICAL DATA:  Chest pain, cough  EXAM: CHEST  2 VIEW  COMPARISON:  None.  FINDINGS: The heart size and mediastinal contours are within normal limits. Both lungs are clear. The visualized skeletal structures are unremarkable.  IMPRESSION: No active cardiopulmonary disease.   Electronically Signed   By: Inez Catalina M.D.   On: 03/15/2015 08:14   Ct Soft Tissue Neck W Contrast  03/15/2015   CLINICAL DATA:  Weight loss. Neck mass. Swelling. Short of breath.  EXAM: CT NECK WITH CONTRAST  TECHNIQUE: Multidetector CT imaging of the neck was performed using the standard protocol following the bolus administration of intravenous contrast.  CONTRAST:  170mL OMNIPAQUE IOHEXOL 300 MG/ML  SOLN   COMPARISON:  None.  FINDINGS: I think there is an extensive circumferential malignancy in the hypo pharyngeal/supraglottic region. This is difficult to measure precisely because of a superficial spreading pattern. The most bulky tumor is in the right supraglottic region.  There are multiple enlarged necrotic lymph nodes consistent with metastatic involvement. This includes retropharyngeal and parapharyngeal nodes, level 2 nodes bilaterally, level 3 and left-sided level for lymph nodes. The largest nodal mass is a a right level 2 conglomeration measuring 5.2 x 3.8 x 3.5 cm in diameter.  Parotid glands are normal. Submandibular glands are normal. Thyroid gland contains a 13 mm cyst or nodule in the left lobe. Carotid arteries and jugular veins are patent, though of both jugular veins show extrinsic compression by large nodal masses.  No significant bony finding.  IMPRESSION: I think there is an advanced hypopharyngeal/ supraglottic mass, largely with a superficial spreading pattern. Massive bilateral necrotic metastatic lymphadenopathy.  I did consider other possible etiologies such as tuberculosis, but think that is less likely.   Electronically Signed   By: Nelson Chimes M.D.   On: 03/15/2015 09:11   Ct Angio Chest Pe W/cm &/or Wo Cm  03/15/2015   CLINICAL DATA:  Weight loss with hemoptysis  EXAM: CT ANGIOGRAPHY CHEST WITH CONTRAST  TECHNIQUE: Multidetector CT imaging of the chest was performed using the standard protocol during bolus administration of intravenous contrast. Multiplanar CT image reconstructions and MIPs were obtained to evaluate the vascular anatomy.  CONTRAST:  163mL OMNIPAQUE IOHEXOL 300 MG/ML  SOLN  COMPARISON:  None.  FINDINGS: The lungs are well aerated bilaterally without focal infiltrate or sizable effusion. No parenchymal mass lesion is seen. The thoracic aorta and its branches are within normal limits. Prominence of the ascending aorta to 4 cm is noted. No dissection is identified. Normal  tapering is noted distally. The pulmonary artery is within normal limits without evidence of filling defect to suggest pulmonary embolism. No significant hilar or mediastinal adenopathy is noted.  The thoracic inlet shows evidence of lymphadenopathy similar to that seen on the recent CT of the neck. The dominant lymph node is noted adjacent to the left common carotid artery best seen on image number 31 of series 406. It measures 2.9 x 2.0 cm in dimension.  The visualized upper abdomen shows evidence of cholelithiasis. Area decreased attenuation is noted within the liver adjacent to the falciform ligament consistent with focal fatty infiltration. No  acute bony abnormality is noted.  Review of the MIP images confirms the above findings.  IMPRESSION: No evidence of pulmonary embolism.  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 Recommend annual imaging followup by CTA or MRA. This recommendation follows 2010 ACCF/AHA/AATS/ACR/ASA/SCA/SCAI/SIR/STS/SVM Guidelines for the Diagnosis and Management of Patients with Thoracic Aortic Disease. Circulation. 2010; 121: G665-L935  Left supraclavicular adenopathy.  Cholelithiasis.   Electronically Signed   By: Inez Catalina M.D.   On: 03/15/2015 09:18   US Biopsy  03/16/2015   CLINICAL DATA:  54 year old with probable head and neck cancer. Bilateral cervical lymphadenopathy. Tissue diagnosis is needed.  EXAM: ULTRASOUND-GUIDED BIOPSY OF LEFT CERVICAL LYMPH NODES  Physician: Stephan Minister. Anselm Pancoast, MD  FLUOROSCOPY TIME:  None  MEDICATIONS: None  ANESTHESIA/SEDATION: Moderate sedation time: None  PROCEDURE: The procedure was explained to the patient. The risks and benefits of the procedure were discussed and the patient's questions were addressed. Informed consent was obtained from the patient. Ultrasound demonstrated multiple large left cervical lymph nodes. Left side of the neck was prepped and draped in sterile fashion. Skin was anesthetized with 1% lidocaine. Using ultrasound guidance, 4  core biopsies were obtained from a large left cervical lymph node in the upper neck with an 18 gauge device. Specimens were placed in formalin and saline. The core material appeared to be very necrotic and scant. As a result, 4 ultrasound-guided fine-needle aspirations were obtained from additional left cervical lymph nodes with 25 gauge needles. Bandages placed at the puncture sites.  FINDINGS: Multiple enlarged cervical lymph nodes on the left side. Dominant lesion in the left upper cervical lymph node was sampled with the core device. This dominant lymph node measures at least 2.6 cm. FNAs obtained from an adjacent left upper cervical lymph node and a left supraclavicular lymph node.  COMPLICATIONS: None  IMPRESSION: Ultrasound-guided core biopsies and fine-needle aspirations of left cervical lymph nodes.   Electronically Signed   By: Markus Daft M.D.   On: 03/16/2015 17:54   Medications: I have reviewed the patient's current medications. Scheduled Meds: . enoxaparin (LOVENOX) injection  40 mg Subcutaneous Q24H  . ergocalciferol  50,000 Units Oral Weekly  . feeding supplement (RESOURCE BREEZE)  1 Container Oral TID BM  . folic acid  1 mg Intravenous Daily  . Influenza vac split quadrivalent PF  0.5 mL Intramuscular Tomorrow-1000  . LORazepam  0-4 mg Intravenous Q6H   Followed by  . LORazepam  0-4 mg Intravenous Q12H  . multivitamin  5 mL Oral Daily  . nicotine  21 mg Transdermal Daily  . thiamine IV  100 mg Intravenous Daily   Continuous Infusions: . dextrose 5 % and 0.45% NaCl 75 mL/hr at 03/17/15 0257   PRN Meds:.acetaminophen **OR** acetaminophen, albuterol, LORazepam **OR** LORazepam, ondansetron (ZOFRAN) IV, polyethylene glycol Assessment/Plan: Principal Problem:   Neck mass Active Problems:   HTN (hypertension)   Tobacco abuse   Alcohol abuse   Alcohol withdrawal seizure   Prolonged Q-T interval on ECG   Aortic dilatation   Thyroid nodule   Lymphadenopathy   Hypokalemia   B12  deficiency   Marijuana abuse   Folate deficiency   Protein-calorie malnutrition, severe   Vitamin D deficiency  Bilateral Neck Masses with necrotic metastatic lymphadenopathy 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 Currently with no respiratory compromise however with difficulty tolerating oral  secretions. Pt insists on leaving today to take care of personal issues despite recommendations to stay. He reports he will return in 2-3 days. -Appreciate ENT, radiation-oncology, oncology, and palliative care recommendations -Awaiting left cervical lymph node biopsy results -PET scan to be arranged at [facility] once diagnosis confirmed -Pt agreeable to g-tube placement, will consult with IR to see if can be done outpatient or if needs to be done as inpatient by general surgery   -To arrange f/u appts with specialities  -Awaiting speech therapy recommendations for diet recommendation  -Follow-up prealbumin to assess nutritional status  -Continue D5 1/2 NS 100 ml/hr and resource breeze 1 can TID between meals  -Oxygen therapy as needed to keep SpO2 >92% -Albuterol nebulizer Q 4 hr PRN wheezing  -Appreciate SW following   Hypokalemia - K 3.2 with Mg level of 1.8 this AM. Pt with chronic hypokalemia in setting of alcohol abuse. No recent GI losses. -Monitor Mg level, replete with IV magnesium sulfate 2 g as necessary  -IV KCl 10 mEq x2 runs  -Monitor BMP  Left Thyroid Nodule - Pt with 13 mm cyst or nodule in the left thyroid lobe. TSH level normal. -Continue to monitor   Hypertension - Currently normotensive. Pt not on antihypertensives at home. -Hold anti-hypertensives in setting of volume depletion and syncope -Continue to monitor   Tobacco Abuse - Pt with 1-2 pack history for approx past 30 years.  -Place nicotine 21 mg patch daily  -Pt  counseled on tobacco cessation   Alcohol Abuse  - Reports last drink 1 day ago and drinks few beers weekly. History of remote alcohol-withdrawal seizures. Ethanol level was negative on admission.  -Continue CIWA protocol  -Continue 1 mg IV folic acid, liquid multivitamin, and IV B1 100 mg daily   Vitamin D deficiency - Level <4 -Start liquid ergocalciferol 50 K U weekly for 8 weeks  Folate Deficiency - Folate level low at 3.2.  -Continue  IV folate 1 mg daily   Marijuana Abuse - Pt reports smoking THC 1-2 times weekly and UDS positive for THC.  -Pt counseled on cessation   History of B12 deficiency - Per sister was on IM injections in the past with imbalance and paraesthesias. B12 level normal (480) on admission.    Thoracic Aortic Dilation - 4 cm ascending aortic dilation seen on CTA chest. -Needs follow-up CTA or MRA in 1 year  Prolonged QT - EKG 470 in setting of hypokalemia and alcohol abuse.  -Avoid prolonging QT medications  -Replete potassium and magnesium as needed  Hepatitis C and HIV Ab testing -  Negative    Diet: Dysphagia 3 DVT Ppx: Lovenox  Code: Full   Dispo: Disposition is deferred at this time, awaiting improvement of current medical problems. Anticipated discharge is today. Pt insists on leaving today despite recommendations to stay.    The patient does not have a current PCP (No Pcp Per Patient) and does need an [facility] hospital follow-up appointment after discharge.  The patient does not have transportation limitations that hinder transportation to clinic appointments.  .Services Needed at time of discharge: Y = Yes, Blank = No PT:   OT:   RN:   Equipment:   Other:     LOS: 2 days

## 2015-03-17 NOTE — Clinical Social Work Note (Signed)
Clinical Social Worker notified by financial counseling that counselor was able to meet with patient and secure an appointment for April 15th at 10:30am to complete Medicaid application with Helene Kelp, Development worker, community.  CSW remains available as needed.   Glendon Axe, MSW, LCSWA [phone] 03/17/2015 12:38 PM

## 2015-03-17 NOTE — Discharge Summary (Signed)
Name: Kyle Alexander MRN: [id number] DOB: 24-Sep-1961 54 y.o. PCP: No Pcp Per Patient  Date of Admission: 03/15/2015  7:20 AM Date of Discharge: 03/17/2015 Attending Physician: Bertha Stakes, MD  Discharge Diagnosis:  Squamous Cell Carcinoma of the supraglottis  Severe Protein Calorie Malnutrition  Hypokalemia Left Thyroid Nodule vs Cyst  Hypertension Tobacco Abuse Chronic Alcohol Abuse Vitamin D deficiency Folate Deficiency Marijuana Abuse  History of B12 deficiency   Thoracic Aortic Dilation  Prolonged QT Hepatitis C and HIV Ab testing  DVT Prophylaxis       Discharge Medications:   Medication List    TAKE these medications        ergocalciferol 8000 UNIT/ML drops  Commonly known as:  DRISDOL  Take 6.3 mLs (50,000 Units total) by mouth once a week.  Start taking on:  03/24/2015     feeding supplement (RESOURCE BREEZE) Liqd  Take 1 Container by mouth 3 (three) times daily between meals.        Disposition and follow-up:   [name] was discharged from [facility] in Good condition.  At the hospital follow up visit please address:  1.  Newly diagnosed Squamous Cell Supraglottis Carcinoma - Cytology/pathology results returned day after pt was discharged on 03/18/15. Pt to have PET scan, g-tube placement, radiation therapy (possible chemo). Ensure compliance with nutrition supplements and ability to tolerate dysphagia 3 diet.         Folate deficiency - start folate daily if able to swallow       Vitamin D deficiency - started on liquid vitamin D weekly for 8 weeks, recheck levels after completion of therapy   2.  Labs / imaging needed at time of follow-up: CBC (Hg), BMP (K), 25-OH vitamin D level in 2 months (June 2016), CTA chest in 1 yr (April 2017) to assess thoracic aortic aneurysm   3.  Pending labs/ test needing follow-up: Lymph node biopsy results, P16 stain  Follow-up Appointments:     Follow-up Information    Follow up with  PET Scan [facility]. Go on 03/27/2015.   Why:  1:00pm, arrive 30 minutes prior to appointment. Do not eat or drink 6 hours prior (nothing by mouth starting at 8am morning of exam)   Contact information:   [facility]      Follow up with [name], MD On 03/20/2015.   Specialty:  Radiation Oncology   Why:  8am    Contact information:   [address] [place] [address] [phone]       Follow up with Lucious Groves, DO On 03/27/2015.   Specialty:  Internal Medicine   Why:  3:15 PM Bring $25, if  cannot please call before appt   Contact information:   [place] [address] [phone]       Discharge Instructions:  You have follow-up with Dr. Isidore Moos on April 8th (Friday) at [place].   Please take feeding supplement ([employer]).  Start taking liquid vitamin D weekly for 7 more weeks starting next Tuesday   Follow dietary instructions as provided in this packet. Remain upright for 30-60 minutes after eating.  Return to the ER immediately if you are coughing up blood or are unable to swallow.   Consultations: Treatment Team:  [name], MD [name], MD Palliative [facility]  Dr. [name]   Procedures Performed:  Dg Chest 2 View  03/15/2015   CLINICAL DATA:  Chest pain, cough  EXAM: CHEST  2 VIEW  COMPARISON:  None.  FINDINGS: The heart size and mediastinal contours are within normal limits. Both lungs are clear. The visualized skeletal structures are unremarkable.  IMPRESSION: No active cardiopulmonary disease.   Electronically Signed   By: Inez Catalina M.D.   On: 03/15/2015 08:14   Ct Soft Tissue Neck W Contrast  03/15/2015   CLINICAL DATA:  Weight loss. Neck mass. Swelling. Short of breath.  EXAM: CT NECK WITH CONTRAST  TECHNIQUE: Multidetector CT imaging of the neck was performed using the standard protocol following the bolus administration of intravenous contrast.  CONTRAST:  182mL OMNIPAQUE IOHEXOL 300 MG/ML  SOLN  COMPARISON:  None.   FINDINGS: I think there is an extensive circumferential malignancy in the hypo pharyngeal/supraglottic region. This is difficult to measure precisely because of a superficial spreading pattern. The most bulky tumor is in the right supraglottic region.  There are multiple enlarged necrotic lymph nodes consistent with metastatic involvement. This includes retropharyngeal and parapharyngeal nodes, level 2 nodes bilaterally, level 3 and left-sided level for lymph nodes. The largest nodal mass is a a right level 2 conglomeration measuring 5.2 x 3.8 x 3.5 cm in diameter.  Parotid glands are normal. Submandibular glands are normal. Thyroid gland contains a 13 mm cyst or nodule in the left lobe. Carotid arteries and jugular veins are patent, though of both jugular veins show extrinsic compression by large nodal masses.  No significant bony finding.  IMPRESSION: I think there is an advanced hypopharyngeal/ supraglottic mass, largely with a superficial spreading pattern. Massive bilateral necrotic metastatic lymphadenopathy.  I did consider other possible etiologies such as tuberculosis, but think that is less likely.   Electronically Signed   By: Nelson Chimes M.D.   On: 03/15/2015 09:11   Ct Angio Chest Pe W/cm &/or Wo Cm  03/15/2015   CLINICAL DATA:  Weight loss with hemoptysis  EXAM: CT ANGIOGRAPHY CHEST WITH CONTRAST  TECHNIQUE: Multidetector CT imaging of the chest was performed using the standard protocol during bolus administration of intravenous contrast. Multiplanar CT image reconstructions and MIPs were obtained to evaluate the vascular anatomy.  CONTRAST:  134mL OMNIPAQUE IOHEXOL 300 MG/ML  SOLN  COMPARISON:  None.  FINDINGS: The lungs are well aerated bilaterally without focal infiltrate or sizable effusion. No parenchymal mass lesion is seen. The thoracic aorta and its branches are within normal limits. Prominence of the ascending aorta to 4 cm is noted. No dissection is identified. Normal tapering is noted  distally. The pulmonary artery is within normal limits without evidence of filling defect to suggest pulmonary embolism. No significant hilar or mediastinal adenopathy is noted.  The thoracic inlet shows evidence of lymphadenopathy similar to that seen on the recent CT of the neck. The dominant lymph node is noted adjacent to the left common carotid artery best seen on image number 31 of series 406. It measures 2.9 x 2.0 cm in dimension.  The visualized upper abdomen shows evidence of cholelithiasis. Area decreased attenuation is noted within the liver adjacent to the falciform ligament consistent with focal fatty infiltration. No acute bony abnormality is noted.  Review of the MIP images confirms the above findings.  IMPRESSION: No evidence of pulmonary embolism.  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 Recommend annual imaging followup by CTA or MRA. This recommendation follows 2010 ACCF/AHA/AATS/ACR/ASA/SCA/SCAI/SIR/STS/SVM Guidelines for the Diagnosis and Management of Patients with Thoracic Aortic Disease. Circulation. 2010; 121: I356-Y616  Left supraclavicular adenopathy.  Cholelithiasis.   Electronically Signed  By: Inez Catalina M.D.   On: 03/15/2015 09:18   US Biopsy  03/16/2015   CLINICAL DATA:  54 year old with probable head and neck cancer. Bilateral cervical lymphadenopathy. Tissue diagnosis is needed.  EXAM: ULTRASOUND-GUIDED BIOPSY OF LEFT CERVICAL LYMPH NODES  Physician: Stephan Minister. Anselm Pancoast, MD  FLUOROSCOPY TIME:  None  MEDICATIONS: None  ANESTHESIA/SEDATION: Moderate sedation time: None  PROCEDURE: The procedure was explained to the patient. The risks and benefits of the procedure were discussed and the patient's questions were addressed. Informed consent was obtained from the patient. Ultrasound demonstrated multiple large left cervical lymph nodes. Left side of the neck was prepped and draped in sterile fashion. Skin was anesthetized with 1% lidocaine. Using ultrasound guidance, 4 core biopsies were  obtained from a large left cervical lymph node in the upper neck with an 18 gauge device. Specimens were placed in formalin and saline. The core material appeared to be very necrotic and scant. As a result, 4 ultrasound-guided fine-needle aspirations were obtained from additional left cervical lymph nodes with 25 gauge needles. Bandages placed at the puncture sites.  FINDINGS: Multiple enlarged cervical lymph nodes on the left side. Dominant lesion in the left upper cervical lymph node was sampled with the core device. This dominant lymph node measures at least 2.6 cm. FNAs obtained from an adjacent left upper cervical lymph node and a left supraclavicular lymph node.  COMPLICATIONS: None  IMPRESSION: Ultrasound-guided core biopsies and fine-needle aspirations of left cervical lymph nodes.   Electronically Signed   By: Markus Daft M.D.   On: 03/16/2015 17:54    Admission HPI: Original Author Juluis Mire, MD  Drue Stager. Lagrand is a 54 year old man with past medical history of hypertension and tobacco/alcohol abuse who presents with progressive dysphagia, weight loss, and enlarging neck mass for past several months.   Per his sister he has had dysphagia since at least August where he was seen in the ED for 4-6 week history of dysphagia to solids, voice hoarseness, and tender cervical lymphadenopathy. He unfortunately left AMA without further work-up at that time  He reports his dysphagia has progressively gotten worse in the past 3 months and now to both solids, liquids, and oral secretions. He has had episodes of choking for the past 2 weeks and 3-pillow orthopnea causing him inability to sleep. He has had enlarging tender bilateral neck masses first noticed about 3 months ago as a lump in his throat with associated voice hoarseness and drooling for the past 2-4 weeks. He has also had hemoptysis for the past 2 weeks and occasional wheezing. He has had 30-40 lb weight loss in the past several months (reports  his normal weight is 172 lb). He has very poor dentition and pulled his right upper molar a few days ago due to decay. He has bilateral ear pain and occasional jaw pain. He reports having a syncopal episode three days ago after standing up to go to the bathroom with feeling of lightheadedness before the episode occurred. He reports having syncope in the past (as a child) with no prior work-up.   He has used chewing tobacco in his younger days and is a current pack cigarette smoker for the past 30 years or so. He is a life-long alcohol drinker and drink a few beers a week. He is able to drink alcohol currently and reports it numbs his throat. He has history of alcohol withdrawal seizure approximately 5 years ago. He denies head/neck radiation or thyroid problems. There is  family history of colon cancer in his mother. He has poor medical follow-up and has not seen a doctor in years.    In the ED CT neck revealed advanced hypopharyngeal/supraglottic mas and massive bilateral necrotic metastatic lymphadenopathy. CTA chest did not reveal evidence of PE. There was 4 cm dilation of the ascending aorta.    Hospital Course by problem list:   Squamous Cell Carcinoma of the supraglottis - Pt presented with 44-month history of progressive dysphagia, voice hoarseness, weight loss, and enlarging neck masses in setting of chronic alcohol and tobacco abuse. CT neck with extensive circumferential malignancy in the hypopharyngeal/supraglottic region with majority in the right supraglottic region. There were multiple enlarged necrotic lymph nodes consistent with metastatic involvement including retropharyngeal and parapharyngeal nodes with largest nodal mass right level 2 conglomeration measuring 5.2 x 3.8 x 3.5 cm in diameter. CTA chest with no evidence of PE. Bedside laryngoscopy by ENT (Dr [name]) revealed oropharyngeal/hypopharyngeal bulky tumor mass which was ulcerated and bled with manipulation. 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 Pt had IR mediated FNA and core needle biopsy of left cervical lymph node on 03/16/15 with results that were pending at time of discharge and resulted on 03/18/15 consistent with squamous cell carcinoma. Pt was seen by ENT, medical-oncology, radiation-oncology, palliative care, speech therapy, registered dietician, and social worker during hospitalization. Pt was not a surgical candidate and most likely to have palliative radiation therapy vs induction chemotherapy and then radiation therapy once PET scan performed (scheduled for April 15) and g-tube placement (IR was consulted who reported favorable anatomy that could be done as outpatient). Pt scheduled for follow-up with radiation-oncologist Dr. Isidore Moos on 03/20/15 which pt stated he would attend to discuss biopsy results and next steps in treatment plan. Speech therapy recommended dysphagia 3 diet with instructions to refrain from speaking after eating and staying upright 30-60 minutes after meals. Pt was instructed to continue this diet in addition to nutrition supplementation (ensure). Pt had no respiratory compromise during hospitalization however with difficulty tolerating oral secretions and choking at times with meals. His hemoptysis on admission resolved and had no further episodes during hospitalization and was hemodynamically stable. Pt insisted on leaving to take care of personal issues despite recommendations to stay. Per ENT there was no increased risk of his current situation at home than prior to admission and had stable airway that was reassuring. He refused having g-tube placed during hospitalization but agreed to having this placed as outpatient. He stated he would attend his follow-up appointments and was instructed to go the ED if he developed any further episodes of hemoptysis or respiratory  distress which he verbalized understanding.    Severe Protein Calorie Malnutrition - Pt with weight of 127 lb in setting of hypopharyngeal cancer with progressive dysphagia and weight loss (reported 40 llbs) over past 9 months. Prealbumin was low at 11. Pt was seen by dietician during hospitalization who recommended nutrition supplementation (resource breeze TID) which he received during hospitalization and at discharge (ensure TID).   Hypokalemia - Pt with potassium level of 3-3.5 with normal magnesium level during hospitalization AM. Pt with chronic hypokalemia in setting of alcohol abuse and poor nutrition with no recent GI losses. Pt's potassium and magnesium were repleted as needed during hospitalization. Pt to have close hospital follow-up to reassess levels.   Left Thyroid Nodule vs Cyst - Pt with 13 mm  cyst or nodule in the left thyroid lobe on CT neck with contrast. TSH level was normal. No follow-up imaging was recommended.    Hypertension - Pt with history of hypertension not on antihypertensives at home. His blood pressure range was 114/79 to 144/101 during hospitalization and did not require anti-hypertensive therapy.   Tobacco Abuse - Pt reported smoking 1-2 packs for approximately 30 years. He received nicotine 21 mg patch daily during hospitalization. Pt was counseled on tobacco cessation during hospitalization.    Chronic Alcohol Abuse-  Pt reported drinking 1 day prior to admission and drinks few beers weekly. Pt with history of remote alcohol-withdrawal seizures. Ethanol level was negative on admission. Pt was placed on CIWA protocol with score of 0-3 with 1 dose of ativan administration during hospitalization. Pt had no withdrawal seizures during hospitalization. Pt received folic acid, multivitamin, and B1 during hospitalization. Pt was counseled on alcohol cessation.    Vitamin D deficiency - Pt with level of <4 and started on liquid ergocalciferol 50 K U weekly for 8 weeks. Pt  to have levels rechecked after 8 weeks of therapy.    Folate Deficiency - Pt had folate level of 3.2 in setting of chronic alcohol abuse. Pt received folate 1 mg daily during hospitalization. Due to risk of choking pt was not instructed to take folate at home. Pt will have follow-up appointment to assess if can be started as outpatient.      Marijuana Abuse - Pt reported smoking THC regularly and UDS was positive for THC. Pt was counseled on cessation.   History of B12 deficiency - Per sister pt was on IM injections in the past with imbalance and paraesthesias. B12 level was normal (480) on admission.   Thoracic Aortic Dilation - Pt with 4 cm ascending aortic dilation seen on CTA chest that needs follow-up CTA or MRA in 1 year.   Prolonged QT -  Pt with prolonged QTc of 470 in setting of hypokalemia and alcohol abuse. QT prolonging medications were avoided during hospitalization and potassium and magnesium were repleted as needed.  Hepatitis C and HIV Ab testing - Pt tested negative for HIV and Hepatitis C Ab testing during hospitalization.    DVT Prophylaxis - Pt received lovenox during hospitalization with no evidence of DVT or PE (CTA chest negative for PE).        Discharge Vitals:   BP 125/92 mmHg  Pulse 87  Temp(Src) 99.6 F (37.6 C) (Oral)  Resp 20  Ht 5' 10.5" (1.791 m)  Wt 127 lb 14.4 oz (58.015 kg)  BMI 18.09 kg/m2  SpO2 100%  Discharge Labs:  Results for orders placed or performed during the hospital encounter of 03/15/15 (from the past 24 hour(s))  Basic metabolic panel     Status: Abnormal   Collection Time: 03/17/15  8:17 AM  Result Value Ref Range   Sodium 135 135 - 145 mmol/L   Potassium 3.2 (L) 3.5 - 5.1 mmol/L   Chloride 99 96 - 112 mmol/L   CO2 33 (H) 19 - 32 mmol/L   Glucose, Bld 105 (H) 70 - 99 mg/dL   BUN <5 (L) 6 - 23 mg/dL   Creatinine, Ser 0.58 0.50 - 1.35 mg/dL   Calcium 9.2 8.4 - 10.5 mg/dL   GFR calc non Af Amer >90 >90 mL/min   GFR calc Af  Amer >90 >90 mL/min   Anion gap 3 (L) 5 - 15  Magnesium     Status: None  Collection Time: 03/17/15  8:17 AM  Result Value Ref Range   Magnesium 1.8 1.5 - 2.5 mg/dL  CBC     Status: Abnormal   Collection Time: 03/17/15  8:17 AM  Result Value Ref Range   WBC 11.1 (H) 4.0 - 10.5 K/uL   RBC 3.79 (L) 4.22 - 5.81 MIL/uL   Hemoglobin 12.4 (L) 13.0 - 17.0 g/dL   HCT 36.9 (L) 39.0 - 52.0 %   MCV 97.4 78.0 - 100.0 fL   MCH 32.7 26.0 - 34.0 pg   MCHC 33.6 30.0 - 36.0 g/dL   RDW 14.9 11.5 - 15.5 %   Platelets 185 150 - 400 K/uL    Signed: Juluis Mire, MD 03/17/2015, 2:35 PM    Services Ordered on Discharge: None Equipment Ordered on Discharge: None

## 2015-03-17 NOTE — Progress Notes (Signed)
Pt is being discharged to home. Discharge instructions were given to patient

## 2015-03-17 NOTE — Evaluation (Signed)
Clinical/Bedside Swallow Evaluation Patient Details  Name: Kyle Alexander MRN: [id number] Date of Birth: 24-Apr-1961  Today's Date: 03/17/2015 Time: SLP Start Time (ACUTE ONLY): 12 SLP Stop Time (ACUTE ONLY): 1052 SLP Time Calculation (min) (ACUTE ONLY): 30 min  Past Medical History:  Past Medical History  Diagnosis Date  . Hypertension    Past Surgical History:  Past Surgical History  Procedure Laterality Date  . Knee surgery  1990  . Tonsillectomy and adenoidectomy     HPI:  54 year old man with history of hypertension, tobacco, and alcohol who presented with complaint of an enlarging neck mass, progressive dysphagia, and weight loss for the past several months. Per MD note patient was previously seen in urgent care on 07/21/2014 with complaint of progressive discomfort with swallowing and swollen cervical lymph nodes; although further workup was advised then, the patient left and did not return for evaluation.Per MD documentation he reports dysphagia to solids, but says he is able to get liquids down.CT neck shows a large soft tissue mass in oropharynx, hypopharynx, larynx. Irregularity of vocal cords, RIGHT>LEFT.   Massive bilateral necrotic centered adenopathy including parapharyngeal nodes, retropharyngeal nodes. He underwent Ultrasound guided biopsy by IR on 4/4 with results currently pending. CXR No active cardiopulmonary disease.    Assessment / Plan / Recommendation Clinical Impression  Pt demonstrated a wet, hoarse vocal quality at baseline. Suspect pharyngeal residue due to multiple swallow performed and possible penetration with intermittent wet vocal quality and delayed throat clears. Reflexive throat clears strong and likely clears penetrates if present. CXR without cardiopulmaonary disease. SLP educated extensively re: safe diet textures to consume once home (soft solids), thin liquids in addition to small sips, two swallows, no straws, alternate liquids solids and pills  whole in applesauce (if pill is large, cut in half). Pt mentioned PEG and SLP explained reasoning/rationale if warranted while in treatment. Pt also educated to swallow and wait minimum of 5 seconds before speaking. Pt being worked up for treatment plan re: tumor.    Aspiration Risk  Moderate    Diet Recommendation Dysphagia 3 (Mechanical Soft);Thin liquid   Liquid Administration via: Cup;No straw Medication Administration: Whole meds with puree Supervision: Patient able to self feed Compensations: Slow rate;Small sips/bites;Multiple dry swallows after each bite/sip;Follow solids with liquid Postural Changes and/or Swallow Maneuvers: Seated upright 90 degrees;Upright 30-60 min after meal    Other  Recommendations Oral Care Recommendations: Oral care BID   Follow Up Recommendations   (TBD)    Frequency and Duration min 2x/week  2 weeks   Pertinent Vitals/Pain none         Swallow Study           Oral/Motor/Sensory Function Overall Oral Motor/Sensory Function: Appears within functional limits for tasks assessed   Ice Chips Ice chips: Not tested   Thin Liquid Thin Liquid: Impaired Presentation: Cup Pharyngeal  Phase Impairments: Throat Clearing - Delayed;Wet Vocal Quality;Multiple swallows    Nectar Thick Nectar Thick Liquid: Not tested   Honey Thick Honey Thick Liquid: Not tested   Puree Puree: Impaired Presentation: Spoon Pharyngeal Phase Impairments: Multiple swallows   Solid   GO    Solid: Impaired Pharyngeal Phase Impairments: Cough - Delayed;Throat Clearing - Immediate       Houston Siren 03/17/2015,11:00 AM   Orbie Pyo Colvin Caroli.[employer] [phone]

## 2015-03-17 NOTE — Progress Notes (Signed)
03/17/2015 9:28 AM  Kyle Alexander [id number]  Hosp Day 3    Temp:  [98.3 F (36.8 C)-98.7 F (37.1 C)] 98.3 F (36.8 C) (04/05 0654) Pulse Rate:  [72-98] 72 (04/05 0654) Resp:  [18-20] 18 (04/05 0654) BP: (114-137)/(69-91) 114/83 mmHg (04/05 0654) SpO2:  [93 %-100 %] 100 % (04/05 0654) Weight:  [58.015 kg (127 lb 14.4 oz)] 58.015 kg (127 lb 14.4 oz) (04/05 0129),    No intake or output data in the 24 hours ending 03/17/15 0928  Results for orders placed or performed during the hospital encounter of 03/15/15 (from the past 24 hour(s))  CBC     Status: Abnormal   Collection Time: 03/17/15  8:17 AM  Result Value Ref Range   WBC 11.1 (H) 4.0 - 10.5 K/uL   RBC 3.79 (L) 4.22 - 5.81 MIL/uL   Hemoglobin 12.4 (L) 13.0 - 17.0 g/dL   HCT 36.9 (L) 39.0 - 52.0 %   MCV 97.4 78.0 - 100.0 fL   MCH 32.7 26.0 - 34.0 pg   MCHC 33.6 30.0 - 36.0 g/dL   RDW 14.9 11.5 - 15.5 %   Platelets 185 150 - 400 K/uL    SUBJECTIVE:  Pain, breathing, swallowing all stable.  Really not taking adequate po, but undecided about G-tube.  FNA done yesterday without difficulty  OBJECTIVE:  Breathing OK.  Difficulty with thick secretions  IMPRESSION:  stable  PLAN:  Planning on discharge to deal with personal matters at home, then progress with treatment soon.  Has numerous social and logistical issues to be dealt with. Await Path report  [name]

## 2015-03-17 NOTE — Progress Notes (Signed)
Palliative Medicine Team Progress Note   S: awake and alert, sitting in a chair working with SLP. Will be able to swallow with precautions and chin tuck/ Had needle bx of left cervical node yesterday - no c/o. For LOA today with return in 48 hrs. To probably begin treatment.  PE - Vitals stablle         HEENT - swollen nodes visible neck  Cor- RRR  Pulm - normal respirations     Assessment: 1. 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 Plan For Cancer conf with treatment recommendations to follow. He is willing to proceed 2. Code status - wishes to be full code, BUT no prolonged ventilation or prolonged ICU care   Plan: For treatment Provided with team tele # to call for questions or issues. He has no [facility] or outpatient source of care. Will follow during future hospitalizations   Time: 15 min Greater than 50% on counseling   Adella Hare, MD, [place]

## 2015-03-19 ENCOUNTER — Telehealth: Payer: Self-pay | Admitting: *Deleted

## 2015-03-19 ENCOUNTER — Encounter: Payer: Self-pay | Admitting: Radiation Oncology

## 2015-03-19 NOTE — Telephone Encounter (Signed)
Pharmacy called and needs new order for Vit D capsules. Liquid soln is $100.00 per month. Pt thought he could swollen capsule after seeing one by pharmacy. Hilda [place]ades [place] Augustine RN 03/19/15 5PM

## 2015-03-19 NOTE — Progress Notes (Signed)
Head and Neck Cancer Location of Tumor / Histology: neck, squamous cell carcinoma  Patient presented < 1 month ago with symptoms of: weight loss 40+ lbs, dysphagia, neck masses, hemoptysis, voice changes, increased dyspnea  Biopsies of  (if applicable) revealed:  4/0/98 Diagnosis Lymph node, needle/core biopsy, Left cervical - ATYPICAL SQUAMOUS CELLS. LYMPH NODE, FINE NEEDLE ASPIRATION, LEFT CERVICAL (SPECIMEN 1 OF 1 COLLECTED 03-16-2015) MALIGNANT CELLS PRESENT, CONSISTENT WITH SQUAMOUS CELL CARCINOMA. SEE COMMENT. The tumor cells show faint nuclear and cytoplasmic staining for p16.  Nutrition Status Yes No Comments  Weight changes? x []  40+ loss  Swallowing concerns? [x]  []  dysphagia  PEG? []  [x]     Referrals Yes No Comments  Social Work? [x]  []  In hospital  Dentistry? []  [x]    Swallowing therapy? [x]  []  In hospital  Nutrition? [x]  []    Med/Onc? [x]  []  Dr Alvy Bimler   Safety Issues Yes No Comments  Prior radiation? []  [x]    Pacemaker/ICD? []  [x]    Possible current pregnancy? []  [x]    Is the patient on methotrexate? []  [x]     Tobacco/Marijuana/Snuff/ETOH use: 1 PPD x 35 years, former user of smokeless tobacco, alcohol use 18 oz /week, history of heavy alcohol use at younger age  Past/Anticipated interventions by otolaryngology, if any: Dr [name] - biopsy  Past/Anticipated interventions by medical oncology, if any: Dr Alvy Bimler - 03/15/45 I would feel he would benefit from chemotherapy but not concurrently as it would be too toxic. If chemotherapy is contemplated, induction chemotherapy followed by radiation would be an option.  Current Complaints / other details:  Single, no children, was Nature conservation officer, unemployed now, history of heavy drinking in his youth Mother history of cancer  03/27/15 PET scan  Patient reports a "raspy" feeling in his throat.  He reports coughing frequently and spitting up white sputum sometimes with a small amount of bright red blood.  He reports he  is not able to lay flat due to feeling like he is chocking.  He reports his throat is sore from a scope he had on Monday.    BP 128/82 mmHg  Pulse 86  Temp(Src) 98.7 F (37.1 C) (Oral)  Resp 16  Ht 5' 10.5" (1.791 m)  Wt 124 lb 3.2 oz (56.337 kg)  BMI 17.56 kg/m2  SpO2 100%

## 2015-03-20 ENCOUNTER — Encounter: Payer: Self-pay | Admitting: *Deleted

## 2015-03-20 ENCOUNTER — Ambulatory Visit
Admit: 2015-03-20 | Discharge: 2015-03-20 | Disposition: A | Discharge status: Home / Self Care | Payer: Medicaid Other | Attending: Radiation Oncology | Admitting: Radiation Oncology

## 2015-03-20 ENCOUNTER — Encounter: Payer: Self-pay | Admitting: Radiation Oncology

## 2015-03-20 ENCOUNTER — Ambulatory Visit: Admission: RE | Admit: 2015-03-20 | Payer: Medicaid Other | Source: Ambulatory Visit | Admitting: Radiation Oncology

## 2015-03-20 VITALS — BP 128/82 | HR 86 | Temp 98.7°F | Resp 16 | Ht 70.5 in | Wt 124.2 lb

## 2015-03-20 DIAGNOSIS — C321 Malignant neoplasm of supraglottis: Secondary | ICD-10-CM | POA: Diagnosis not present

## 2015-03-20 DIAGNOSIS — F17210 Nicotine dependence, cigarettes, uncomplicated: Secondary | ICD-10-CM | POA: Diagnosis not present

## 2015-03-20 DIAGNOSIS — C760 Malignant neoplasm of head, face and neck: Secondary | ICD-10-CM

## 2015-03-20 DIAGNOSIS — L598 Other specified disorders of the skin and subcutaneous tissue related to radiation: Secondary | ICD-10-CM | POA: Diagnosis not present

## 2015-03-20 DIAGNOSIS — Z510 Encounter for antineoplastic radiation therapy: Secondary | ICD-10-CM | POA: Diagnosis not present

## 2015-03-20 DIAGNOSIS — Z931 Gastrostomy status: Secondary | ICD-10-CM | POA: Diagnosis not present

## 2015-03-20 DIAGNOSIS — F419 Anxiety disorder, unspecified: Secondary | ICD-10-CM | POA: Diagnosis not present

## 2015-03-20 NOTE — Progress Notes (Signed)
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 Malignant neoplasm of head, face, and neck 195.0 C76.0   2. Malignant neoplasm of supraglottis 161.1 C32.1      Narrative:  The patient returns today for routine follow-up Biopsies revealed:  03/15/45 Diagnosis Lymph node, needle/core biopsy, Left cervical - ATYPICAL SQUAMOUS CELLS. LYMPH NODE, FINE NEEDLE ASPIRATION, LEFT CERVICAL (SPECIMEN 1 OF 1 COLLECTED 03-16-2015) MALIGNANT CELLS PRESENT, CONSISTENT WITH SQUAMOUS CELL CARCINOMA. SEE COMMENT. The tumor cells show faint nuclear and cytoplasmic staining for p16.  Nutrition Status Yes No Comments  Weight changes? x []  40+ loss  Swallowing concerns? [x]  []  dysphagia  PEG? []  [x]     Referrals Yes No Comments  Social Work? [x]  []  In hospital  Dentistry? []  [x]    Swallowing therapy? [x]  []  In hospital  Nutrition? [x]  []    Med/Onc? [x]  []  Dr Alvy Bimler   Safety Issues Yes No Comments  Prior radiation? []  [x]    Pacemaker/ICD? []  [x]    Possible current pregnancy? []  [x]    Is the patient on methotrexate? []  [x]     Tobacco/Marijuana/Snuff/ETOH use: 1 PPD x 35 years, former user of smokeless tobacco, alcohol use 18 oz /week, history of heavy alcohol use at younger age  Past/Anticipated interventions by otolaryngology, if any: Dr [name] - biopsy  Past/Anticipated interventions by medical oncology, if any: Dr Alvy Bimler - 03/15/45 feel he would benefit from chemotherapy but not concurrently as it would be too toxic.   Patient reports a "raspy" feeling in his throat.  He reports coughing frequently and spitting up white sputum sometimes with a small amount of bright red blood.  He reports  he is not able to lay flat due to feeling like he is choking.  He reports his throat is sore from a scope he had on Monday.    BP 128/82 mmHg  Pulse 86  Temp(Src) 98.7 F (37.1 C) (Oral)  Resp 16  Ht 5' 10.5" (1.791 m)  Wt 124 lb 3.2 oz (56.337 kg)  BMI 17.56 kg/m2  SpO2 100%                              ALLERGIES:  is allergic to aspirin.  Meds: Current Outpatient Prescriptions  Medication Sig Dispense Refill  . feeding supplement, RESOURCE BREEZE, (RESOURCE BREEZE) LIQD Take 1 Container by mouth 3 (three) times daily between meals.  0  . [START ON 03/24/2015] ergocalciferol (DRISDOL) 8000 UNIT/ML drops Take 6.3 mLs (50,000 Units total) by mouth once a week. (Patient not taking: Reported on 03/20/2015) 60 mL 7   No current facility-administered medications for this encounter.    Physical Findings: The patient is in no acute distress. Patient is alert and oriented.  height is 5' 10.5" (1.791 m) and weight is 124 lb 3.2 oz (56.337 kg). His oral temperature is 98.7 F (37.1 C). His blood pressure is 128/82 and his pulse is 86. His respiration is 16 and oxygen saturation is 100%. .   General: Alert and oriented, in no acute distress. FRAIL. No stridor  HEENT: Head is normocephalic. Extraocular movements are intact. Oropharynx is notable for  significant swelling in right soft palate. Tongue midline. Poor dentition. Posterior left lower molar is loose Neck: bilateral enlarged nodes in levels II.  Psychiatric: Judgment and insight are intact. Affect is appropriate.   Lab Findings: Lab Results  Component Value Date   WBC 11.1* 03/17/2015   HGB 12.4* 03/17/2015   HCT 36.9* 03/17/2015   MCV 97.4 03/17/2015   PLT 185 03/17/2015   Lab Results  Component Value Date   TSH 1.111 03/15/2015    Radiographic Findings: Dg Chest 2 View  03/15/2015   CLINICAL DATA:  Chest pain, cough  EXAM: CHEST  2 VIEW  COMPARISON:  None.  FINDINGS: The heart size and mediastinal contours are within normal  limits. Both lungs are clear. The visualized skeletal structures are unremarkable.  IMPRESSION: No active cardiopulmonary disease.   Electronically Signed   By: Inez Catalina M.D.   On: 03/15/2015 08:14   Ct Soft Tissue Neck W Contrast  03/15/2015   CLINICAL DATA:  Weight loss. Neck mass. Swelling. Short of breath.  EXAM: CT NECK WITH CONTRAST  TECHNIQUE: Multidetector CT imaging of the neck was performed using the standard protocol following the bolus administration of intravenous contrast.  CONTRAST:  141mL OMNIPAQUE IOHEXOL 300 MG/ML  SOLN  COMPARISON:  None.  FINDINGS: I think there is an extensive circumferential malignancy in the hypo pharyngeal/supraglottic region. This is difficult to measure precisely because of a superficial spreading pattern. The most bulky tumor is in the right supraglottic region.  There are multiple enlarged necrotic lymph nodes consistent with metastatic involvement. This includes retropharyngeal and parapharyngeal nodes, level 2 nodes bilaterally, level 3 and left-sided level for lymph nodes. The largest nodal mass is a a right level 2 conglomeration measuring 5.2 x 3.8 x 3.5 cm in diameter.  Parotid glands are normal. Submandibular glands are normal. Thyroid gland contains a 13 mm cyst or nodule in the left lobe. Carotid arteries and jugular veins are patent, though of both jugular veins show extrinsic compression by large nodal masses.  No significant bony finding.  IMPRESSION: I think there is an advanced hypopharyngeal/ supraglottic mass, largely with a superficial spreading pattern. Massive bilateral necrotic metastatic lymphadenopathy.  I did consider other possible etiologies such as tuberculosis, but think that is less likely.   Electronically Signed   By: Nelson Chimes M.D.   On: 03/15/2015 09:11   Ct Angio Chest Pe W/cm &/or Wo Cm  03/15/2015   CLINICAL DATA:  Weight loss with hemoptysis  EXAM: CT ANGIOGRAPHY CHEST WITH CONTRAST  TECHNIQUE: Multidetector CT imaging of the  chest was performed using the standard protocol during bolus administration of intravenous contrast. Multiplanar CT image reconstructions and MIPs were obtained to evaluate the vascular anatomy.  CONTRAST:  185mL OMNIPAQUE IOHEXOL 300 MG/ML  SOLN  COMPARISON:  None.  FINDINGS: The lungs are well aerated bilaterally without focal infiltrate or sizable effusion. No parenchymal mass lesion is seen. The thoracic aorta and its branches are within normal limits. Prominence of the ascending aorta to 4 cm is noted. No dissection is identified. Normal tapering is noted distally. The pulmonary artery is within normal limits without evidence of filling defect to suggest pulmonary embolism. No significant hilar or mediastinal adenopathy is noted.  The thoracic inlet shows evidence of lymphadenopathy similar to that seen on the recent CT of the neck. The dominant lymph node is noted adjacent to the left common carotid artery best seen on image number 31 of series 406. It measures 2.9 x  2.0 cm in dimension.  The visualized upper abdomen shows evidence of cholelithiasis. Area decreased attenuation is noted within the liver adjacent to the falciform ligament consistent with focal fatty infiltration. No acute bony abnormality is noted.  Review of the MIP images confirms the above findings.  IMPRESSION: No evidence of pulmonary embolism.  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 Recommend annual imaging followup by CTA or MRA. This recommendation follows 2010 ACCF/AHA/AATS/ACR/ASA/SCA/SCAI/SIR/STS/SVM Guidelines for the Diagnosis and Management of Patients with Thoracic Aortic Disease. Circulation. 2010; 121: E454-U981  Left supraclavicular adenopathy.  Cholelithiasis.   Electronically Signed   By: Inez Catalina M.D.   On: 03/15/2015 09:18   US Biopsy  03/16/2015   CLINICAL DATA:  54 year old with probable head and neck cancer. Bilateral cervical lymphadenopathy. Tissue diagnosis is needed.  EXAM: ULTRASOUND-GUIDED BIOPSY OF LEFT  CERVICAL LYMPH NODES  Physician: Stephan Minister. Anselm Pancoast, MD  FLUOROSCOPY TIME:  None  MEDICATIONS: None  ANESTHESIA/SEDATION: Moderate sedation time: None  PROCEDURE: The procedure was explained to the patient. The risks and benefits of the procedure were discussed and the patient's questions were addressed. Informed consent was obtained from the patient. Ultrasound demonstrated multiple large left cervical lymph nodes. Left side of the neck was prepped and draped in sterile fashion. Skin was anesthetized with 1% lidocaine. Using ultrasound guidance, 4 core biopsies were obtained from a large left cervical lymph node in the upper neck with an 18 gauge device. Specimens were placed in formalin and saline. The core material appeared to be very necrotic and scant. As a result, 4 ultrasound-guided fine-needle aspirations were obtained from additional left cervical lymph nodes with 25 gauge needles. Bandages placed at the puncture sites.  FINDINGS: Multiple enlarged cervical lymph nodes on the left side. Dominant lesion in the left upper cervical lymph node was sampled with the core device. This dominant lymph node measures at least 2.6 cm. FNAs obtained from an adjacent left upper cervical lymph node and a left supraclavicular lymph node.  COMPLICATIONS: None  IMPRESSION: Ultrasound-guided core biopsies and fine-needle aspirations of left cervical lymph nodes.   Electronically Signed   By: Markus Daft M.D.   On: 03/16/2015 17:54    Impression/Plan:  Patient wants to be aggressive in his care. While not a chemotherapy candidate, he'd like to attempt 7 weeks of RT for best chance of tumor response and some small chance of cure (assuming PET is negative for mets).  Agreeable to PEG tube and the following recommendations:  1) Dr [name] contacted, anticipate tracheotomy early next week due to risk of airway constriction. This does not seem emergent but will be helpful given pt's inability to lie flat for radiotherapy - Pt knows  to go to ED if breathing become more labored  2) refer to dentistry  3) refer to nutritionist, SLP, PEG tube placement by IR, and Social work.  4) Simulation tentatively scheduled for 4-18.   _____________________________________   [name], MD

## 2015-03-20 NOTE — Progress Notes (Signed)
Following patient consult with Dr. Isidore Moos: 1. Confirmed PET scheduled for Tuesday morning, 0700.    2. Called Dr. [name] who said he will schedule trach for Wed morning.  He is aware that Dr. Enrique Sack may coordinate extractions with trach.  3.  I spoke with Dr. Ritta Slot assistant, Lattie Haw. Requested a dental eval Monday or Tues so Dr. Raliegh Ip can then coordinate dental activity in conjunction with trach on Wed am. Lattie Haw said Dr. Raliegh Ip may have jury duty on Monday but she will communicate high priority of situation.   Gayleen Orem, RN, BSN, [place] at [place] [phone]

## 2015-03-20 NOTE — Progress Notes (Signed)
Met with patient during consult with Dr. Isidore Moos.   1. Introduced myself as his Navigator, explained my role as a member of the Care Team, provided contact information, encouraged them to contact me with questions/concerns as treatments/procedures begin. 2. Provided New Patient Information packet:  Contact information for physician, navigator and other members of the Care Team.  Advance Directive information ([place] blue pamphlet)  Fall Prevention Patient Safety Plan  Appointment Guideline  [facility] campus map with highlight of [place] 3. Provided introductory explanation of radiation treatment including SIM planning and fitting of head mask, showed example of open and closed masks.   4. Provided photos/diagrams of feeding tube, explained purpose and use. 5. Provided smoking cessation information.   6. Explained registration procedure, monitor system in Radiation Waiting. He verbalized understanding of information provided.    Gayleen Orem, RN, BSN, [place] at [place] [phone]

## 2015-03-20 NOTE — Telephone Encounter (Signed)
Talked with Dr Naaman Plummer - prefers for pt not to swallow a pill right now - put Vit D on hold for now. Called [facility] [phone] aware to hold Vit D for now. Pt aware.

## 2015-03-23 ENCOUNTER — Telehealth: Payer: Self-pay | Admitting: *Deleted

## 2015-03-23 ENCOUNTER — Other Ambulatory Visit: Payer: Self-pay | Admitting: *Deleted

## 2015-03-23 DIAGNOSIS — C760 Malignant neoplasm of head, face and neck: Secondary | ICD-10-CM

## 2015-03-23 DIAGNOSIS — C321 Malignant neoplasm of supraglottis: Secondary | ICD-10-CM

## 2015-03-23 NOTE — Telephone Encounter (Signed)
Patient called to confirm appts for this week.  We discussed, I confirmed his understanding of arrival times and locations.  Gayleen Orem, RN, BSN, [place] at [place] [phone]

## 2015-03-23 NOTE — Telephone Encounter (Signed)
CALLED PATIENT TO INFORM OF APPTS., SPOKE WITH PATIENT AND HE IS AWARE OF ALL THESE  APPTS.

## 2015-03-24 ENCOUNTER — Telehealth: Payer: Self-pay | Admitting: *Deleted

## 2015-03-24 ENCOUNTER — Encounter: Payer: Self-pay | Admitting: *Deleted

## 2015-03-24 ENCOUNTER — Ambulatory Visit (HOSPITAL_COMMUNITY)
Admission: RE | Admit: 2015-03-24 | Discharge: 2015-03-24 | Disposition: A | Discharge status: Home / Self Care | Payer: Medicaid Other | Source: Ambulatory Visit | Attending: Internal Medicine | Admitting: Internal Medicine

## 2015-03-24 DIAGNOSIS — Z539 Procedure and treatment not carried out, unspecified reason: Secondary | ICD-10-CM | POA: Insufficient documentation

## 2015-03-24 DIAGNOSIS — C760 Malignant neoplasm of head, face and neck: Secondary | ICD-10-CM | POA: Diagnosis not present

## 2015-03-24 LAB — GLUCOSE, CAPILLARY: Glucose-Capillary: 85 mg/dL (ref 70–99)

## 2015-03-24 NOTE — Progress Notes (Signed)
Met patient in Radiation Oncology Waiting.  He reported he was unable to complete PET d/t excessive secretions, asked if can be rescheduled after trach placement which is scheduled for this Thursday.  I notified Dr. Isidore Moos.  Gayleen Orem, RN, BSN, [place] at [place] [phone]

## 2015-03-24 NOTE — Telephone Encounter (Signed)
Patient called with questions re: trach scheduled for this Thursday.  I reviewed the discussion during last week Friday's appt with Dr. Isidore Moos why a trach is recommended.  In response to my inquiry, he indicated he has not received instructions re: Thursday's procedure.  I called [facility], spoke with Natelia, who explained that someone will be calling him today or tomorrow with instructions.    I called patient with this information.  He verbalized understanding, understands location of check-in at [facility], understands he will be admitted for post-op observation.  Gayleen Orem, RN, BSN, [place] at [place] [phone]

## 2015-03-24 NOTE — Telephone Encounter (Signed)
Called patient to inform of Pet Scan on 04-03-15- arrival time - 9:30 am @ [facility], spoke with patient and he is aware of this test.

## 2015-03-25 ENCOUNTER — Encounter (HOSPITAL_COMMUNITY): Payer: Self-pay | Admitting: Dentistry

## 2015-03-25 ENCOUNTER — Other Ambulatory Visit: Payer: Self-pay | Admitting: Otolaryngology

## 2015-03-25 ENCOUNTER — Encounter: Payer: Self-pay | Admitting: *Deleted

## 2015-03-25 ENCOUNTER — Encounter (HOSPITAL_COMMUNITY): Payer: Self-pay | Admitting: *Deleted

## 2015-03-25 ENCOUNTER — Ambulatory Visit (HOSPITAL_COMMUNITY): Payer: Self-pay | Admitting: Dentistry

## 2015-03-25 VITALS — BP 110/84 | HR 105 | Temp 98.3°F

## 2015-03-25 DIAGNOSIS — M264 Malocclusion, unspecified: Secondary | ICD-10-CM

## 2015-03-25 DIAGNOSIS — K0889 Other specified disorders of teeth and supporting structures: Secondary | ICD-10-CM | POA: Insufficient documentation

## 2015-03-25 DIAGNOSIS — K0530 Chronic periodontitis, unspecified: Secondary | ICD-10-CM | POA: Insufficient documentation

## 2015-03-25 DIAGNOSIS — K083 Retained dental root: Secondary | ICD-10-CM | POA: Insufficient documentation

## 2015-03-25 DIAGNOSIS — C321 Malignant neoplasm of supraglottis: Secondary | ICD-10-CM

## 2015-03-25 DIAGNOSIS — K029 Dental caries, unspecified: Secondary | ICD-10-CM | POA: Insufficient documentation

## 2015-03-25 DIAGNOSIS — K060 Gingival recession: Secondary | ICD-10-CM

## 2015-03-25 DIAGNOSIS — K045 Chronic apical periodontitis: Secondary | ICD-10-CM | POA: Insufficient documentation

## 2015-03-25 DIAGNOSIS — Z01818 Encounter for other preprocedural examination: Secondary | ICD-10-CM

## 2015-03-25 DIAGNOSIS — K036 Deposits [accretions] on teeth: Secondary | ICD-10-CM

## 2015-03-25 DIAGNOSIS — F40298 Other specified phobia: Secondary | ICD-10-CM

## 2015-03-25 MED ORDER — CEFAZOLIN SODIUM-DEXTROSE 2-3 GM-% IV SOLR: 2.0000 g | Freq: Once | INTRAVENOUS | Status: DC

## 2015-03-25 NOTE — H&P (Signed)
Kyle Alexander, [place] 54 y.o., male [id number]     Chief Complaint: breathing difficulty  HPI: Two-week recheck.  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  The plan is for palliative radiation.  He was unable to lay down flat last week for stimulation.  He was unable to lay down flat yesterday for PET scan.  He did see Dr. Enrique Sack DDS this morning and is planning for full mouth extraction.  We have him on scheduled tomorrow morning for tracheostomy, possible awake tracheostomy.  He is sleeping poorly and eating poorly.  He is breathing fairly well except when he gets thick phlegm and then he has trouble.  Comparing our scale to the hospital scales, he has lost 6 more pounds.  He does continue to smoke.     I discussed the surgery in detail including risks and complications.  Questions were answered and informed consent was obtained.  I also discussed the hospitalization.  He will be admitted on the internal medicine service for management of his medical comorbidities.  He will probably have interventional radiology place a gastrostomy tube.  We will need to make some disposition arrangements so that he can be discharged from the hospital in time for his PET scan on April 22.  He would prefer not to stay with his sister in [place].  He is also not terribly enamored of the idea of going to a nursing home, even temporarily.    PMH: Past Medical History  Diagnosis Date  . Hypertension   . Cancer 03/16/15    left cervical lymph node  . Shortness of breath dyspnea   . GERD (gastroesophageal reflux disease)   . Seizures     Surg Hx: Past Surgical History  Procedure Laterality Date  . Knee surgery  1990  . Tonsillectomy and adenoidectomy    . Lymph node biopsy Left 03/16/15    left cervical, consistent with squamous cell carcinoma  . Hernia repair    . Tonsillectomy      FHx:   Family History  Problem Relation Age of Onset  . Cancer Mother   . Diabetes  Mother   . Diabetes Father   . Hypertension Father    SocHx:  reports that he has been smoking.  He has quit using smokeless tobacco. His smokeless tobacco use included Chew. He reports that he drinks about 18.0 oz of alcohol per week. He reports that he uses illicit drugs (Marijuana).  ALLERGIES:  Allergies  Allergen Reactions  . Aspirin     Makes me bleed     (Not in a hospital admission)  Results for orders placed or performed during the hospital encounter of 03/24/15 (from the past 48 hour(s))  Glucose, capillary     Status: None   Collection Time: 03/24/15  7:34 AM  Result Value Ref Range   Glucose-Capillary 85 70 - 99 mg/dL     BP:113/72,  HR: 90 b/min,  Height: 5 ft 10 in, Weight: 118 lb , BMI: 16.9 kg/m2  PHYSICAL EXAM: He is very thin and uncomfortable.  His voice is muffled.  He is breathing adequately but has some trouble managing thick secretions.  He has bulky nodes in both sides of his neck.   Lungs: Distant but clear to auscultation Heart: Regular rate and rhythm without murmur Abdomen: Soft, active, scaphoid Extremities: Thin but normal configuration Neurologic: Symmetric, grossly intact.  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  You are okay to take Advil this evening for your back.  you will be in the hospital several days. [name] 2/45/8099, 5:24 PM

## 2015-03-25 NOTE — Progress Notes (Signed)
   03/25/15 1543  OBSTRUCTIVE SLEEP APNEA  Have you ever been diagnosed with sleep apnea through a sleep study? No  Do you snore loudly (loud enough to be heard through closed doors)?  0  Do you often feel tired, fatigued, or sleepy during the daytime? 1  Has anyone observed you stop breathing during your sleep? 1  Do you have, or are you being treated for high blood pressure? 0  BMI more than 35 kg/m2? 0  Age over 52 years old? 1  Gender: 1

## 2015-03-25 NOTE — Progress Notes (Signed)
DENTAL CONSULTATION  Date of Consultation:  03/25/2015 Patient Name:   Kyle Alexander Date of Birth:   1961/07/20 Medical Record Number: [id number]  VITALS: BP 110/84 mmHg  Pulse 105  Temp(Src) 98.3 F (36.8 C) (Oral)  CHIEF COMPLAINT: Patient was referred by Dr. Isidore Moos for a dental consultation.  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 Patient with anticipated radiation therapy with Dr. Isidore Moos. Patient is now seen as part of a medically necessary preradiation therapy dental protocol examination.  The patient currently denies having any toothaches, swellings, or abscesses. Patient indicates that "All my teeth are bad". Patient also indicated that he "pulled one tooth by myself 3 weeks ago".  Patient has not seen a dentist for 30+ years by report. Patient indicates that he is a dental phobic. Patient has no partial dentures. The patient expressed interest in having all remaining teeth extracted at this time.   PROBLEM LIST: Patient Active Problem List   Diagnosis Date Noted  . Malignant neoplasm of supraglottis 03/20/2015  . Vitamin D deficiency 03/17/2015  . Folate deficiency 03/16/2015  . Protein-calorie malnutrition, severe 03/16/2015  . Neck mass 03/15/2015  . HTN (hypertension) 03/15/2015  . Tobacco abuse 03/15/2015  . Alcohol abuse 03/15/2015  . Alcohol withdrawal seizure 03/15/2015  . Prolonged Q-T interval on ECG 03/15/2015  . Aortic dilatation 03/15/2015  . Thyroid nodule 03/15/2015  . Lymphadenopathy 03/15/2015  . Hypokalemia 03/15/2015  . B12 deficiency 03/15/2015  . Marijuana abuse 03/15/2015    PMH: Past Medical History  Diagnosis Date  . Hypertension   . Cancer 03/16/15    left cervical lymph node    PSH: Past Surgical History  Procedure Laterality Date  . Knee surgery  1990  . Tonsillectomy and adenoidectomy    . Lymph node biopsy Left 03/16/15    left cervical, consistent with squamous cell  carcinoma    ALLERGIES: Allergies  Allergen Reactions  . Aspirin     Makes me bleed    MEDICATIONS: Current Outpatient Prescriptions  Medication Sig Dispense Refill  . ergocalciferol (DRISDOL) 8000 UNIT/ML drops Take 6.3 mLs (50,000 Units total) by mouth once a week. (Patient not taking: Reported on 03/20/2015) 60 mL 7  . feeding supplement, RESOURCE BREEZE, (RESOURCE BREEZE) LIQD Take 1 Container by mouth 3 (three) times daily between meals.  0   No current facility-administered medications for this visit.    LABS: Lab Results  Component Value Date   WBC 11.1* 03/17/2015   HGB 12.4* 03/17/2015   HCT 36.9* 03/17/2015   MCV 97.4 03/17/2015   PLT 185 03/17/2015      Component Value Date/Time   NA 135 03/17/2015 0817   K 3.2* 03/17/2015 0817   CL 99 03/17/2015 0817   CO2 33* 03/17/2015 0817   GLUCOSE 105* 03/17/2015 0817   BUN <5* 03/17/2015 0817   CREATININE 0.58 03/17/2015 0817   CALCIUM 9.2 03/17/2015 0817   GFRNONAA >90 03/17/2015 0817   GFRAA >90 03/17/2015 0817   Lab Results  Component Value Date   INR 1.08 03/15/2015   No results found for: PTT  SOCIAL HISTORY: History   Social History  . Marital Status: Single    Spouse Name: N/A  . Number of Children: N/A  . Years of Education: N/A   Occupational History  . Not on file.   Social History Main Topics  . Smoking status: Current Every Day Smoker -- 1.00 packs/day for 35 years  .  Smokeless tobacco: Former Systems developer  . Alcohol Use: 18.0 oz/week    30 Standard drinks or equivalent per week  . Drug Use: No  . Sexual Activity: Not Currently   Other Topics Concern  . Not on file   Social History Narrative   Lives alone, unemployed. Previously worked in Architect.    FAMILY HISTORY: Family History  Problem Relation Age of Onset  . Cancer Mother   . Diabetes Mother   . Diabetes Father   . Hypertension Father     REVIEW OF SYSTEMS: Reviewed with the patient and is included in the dental  record.  DENTAL HISTORY: CHIEF COMPLAINT: Patient was referred by Dr. Isidore Moos for a dental consultation.  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 Patient with anticipated radiation therapy with Dr. Isidore Moos. Patient is now seen as part of a medically necessary preradiation therapy dental protocol examination.  The patient currently denies having any toothaches, swellings, or abscesses. Patient indicates that "All my teeth are bad". Patient also indicated that he "pulled one tooth by myself 3 weeks ago".  Patient has not seen a dentist for 30+ years by report. Patient indicates that he is a dental phobic. Patient has no partial dentures. The patient expressed interest in having all remaining teeth extracted at this time.  DENTAL EXAMINATION: GENERAL: Patient is a well-developed male in no acute distress. HEAD AND NECK: Patient has bilateral neck lymphadenopathy. Patient denies acute TMJ symptoms. Maximum interincisal opening is 45 mm. INTRAORAL EXAM: Patient has normal saliva. I do not see any evidence of oral abscess formation. Patient has excessive saliva/secrretions in his mouth today. DENTITION: The patient is missing tooth numbers 3, 4, 14, 15, 16, 17, 20, 30, 31, and 32. There are retained roots in the area of tooth numbers 1, 2, and 5. PERIODONTAL: Patient has chronic, advanced periodontal disease with plaque and calculus, gingival recession, and generalized tooth mobility. There is moderate to severe bone loss. DENTAL CARIES/SUBOPTIMAL RESTORATIONS: Patient has rampant dental caries affecting multiple teeth per charting form. ENDODONTIC: Patient currently denies acute toothaches. Patient has multiple areas of periapical pathology and radiolucency. CROWN AND BRIDGE: There are no crown or bridge restorations. PROSTHODONTIC: Patient denies having any partial dentures. OCCLUSION: Patient has a poor occlusal scheme secondary to multiple  missing teeth, multiple retained root segments, and lack of replacement of missing teeth with dental prostheses.  RADIOGRAPHIC INTERPRETATION: An orthopantogram was taken and supplemented with 10 periapical radiographs. Unable to get a full series of dental radiographs due to gag reflex and patient discomfort.  There are multiple missing teeth. There are multiple retained root segments. There are rampant dental caries. There is moderate to severe bone loss. Radiographic calculus is noted. Periapical radiolucencies and pathology is noted. There is supra-eruption and drifting of the unopposed teeth into the edentulous areas. There is atrophy of the edentulous alveolar ridges. There is pneumatization of the maxillary sinuses.  ASSESSMENTS: 1. Carcinoma of the supraglottis 2. Anticipated radiation therapy with Dr. Isidore Moos 3. Anticipated tracheostomy with Dr. [name] 4. Preradiation therapy dental protocol examination 4. Chronic apical periodontitis 5. Multiple retained root segments 6. Rampant dental caries 7. Chronic periodontitis with bone loss 8. Generalized gingival recession 9. Generalized tooth mobility 10. Supra-eruption and drifting of the unopposed teeth into the edentulous areas 11. Malocclusion 12. Atrophy of the edentulous alveolar ridges. 13. 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 I discussed the risks, benefits, and complications of  various treatment options with the patient in relationship to his medical and dental conditions, anticipated radiation therapy, anticipated tracheostomy procedure, and risk for radiation therapy side effects to include xerostomia, radiation caries, trismus, mucositis, pain status, gum and jawbone changes, and risk for infection and osteoradionecrosis. We discussed various treatment options to include no treatment, multiple extractions with alveoloplasty, pre-prosthetic surgery as indicated, periodontal  therapy, dental restorations, root canal therapy, crown and bridge therapy, implant therapy, and replacement of missing teeth as indicated. The patient currently wishes to proceed with extraction of remaining teeth with alveoloplasty and pre-prosthetic surgery as indicated in the operating with general anesthesia. These dental extractions will follow Dr. Noreene Filbert tracheostomy procedure in the operating room on 03/26/2015. The patient will then undergo radiation therapy with Dr. Isidore Moos after adequate healing. Patient may follow-up with a dentist of his choice for fabrication of upper lower complete dentures approximately 3 months after the last radiation therapy has been provided.  2. Discussion of findings with medical team and coordination of future medical and dental care as needed.  I spent in excess of  120 minutes during the conduct of this consultation and >50% of this time involved direct face-to-face encounter for counseling and/or coordination of the patient's care.    Lenn Cal, DDS

## 2015-03-25 NOTE — Patient Instructions (Signed)

## 2015-03-25 NOTE — Progress Notes (Signed)
Pt denies chest pain and being under the care of a cardiologist but stated that he gets SOB. Pt made aware to stop  taking Aspirin,vitamins and herbal medications. Do not take any NSAIDs ie: Ibuprofen, Advil, Naproxen or any medication containing Aspirin. Pt verbalized understanding of all pre-op instructions.

## 2015-03-26 ENCOUNTER — Telehealth: Payer: Self-pay | Admitting: Internal Medicine

## 2015-03-26 ENCOUNTER — Inpatient Hospital Stay (HOSPITAL_COMMUNITY): Payer: Medicaid Other | Admitting: Anesthesiology

## 2015-03-26 ENCOUNTER — Inpatient Hospital Stay (HOSPITAL_COMMUNITY)
Admission: RE | Admit: 2015-03-26 | Discharge: 2015-04-07 | DRG: 003 | Discharge status: Against Medical Advice | Payer: Medicaid Other | Source: Ambulatory Visit | Attending: Family Medicine | Admitting: Family Medicine

## 2015-03-26 ENCOUNTER — Encounter (HOSPITAL_COMMUNITY)
Admission: RE | Discharge status: Against Medical Advice | Payer: Self-pay | Source: Ambulatory Visit | Attending: Family Medicine

## 2015-03-26 ENCOUNTER — Encounter (HOSPITAL_COMMUNITY): Payer: Self-pay | Admitting: *Deleted

## 2015-03-26 DIAGNOSIS — C109 Malignant neoplasm of oropharynx, unspecified: Secondary | ICD-10-CM | POA: Diagnosis not present

## 2015-03-26 DIAGNOSIS — D72829 Elevated white blood cell count, unspecified: Secondary | ICD-10-CM | POA: Diagnosis not present

## 2015-03-26 DIAGNOSIS — L89892 Pressure ulcer of other site, stage 2: Secondary | ICD-10-CM | POA: Diagnosis present

## 2015-03-26 DIAGNOSIS — I10 Essential (primary) hypertension: Secondary | ICD-10-CM | POA: Diagnosis present

## 2015-03-26 DIAGNOSIS — C119 Malignant neoplasm of nasopharynx, unspecified: Secondary | ICD-10-CM | POA: Diagnosis present

## 2015-03-26 DIAGNOSIS — C7839 Secondary malignant neoplasm of other respiratory organs: Secondary | ICD-10-CM | POA: Diagnosis present

## 2015-03-26 DIAGNOSIS — Z720 Tobacco use: Secondary | ICD-10-CM | POA: Diagnosis not present

## 2015-03-26 DIAGNOSIS — I77810 Thoracic aortic ectasia: Secondary | ICD-10-CM | POA: Diagnosis present

## 2015-03-26 DIAGNOSIS — Z930 Tracheostomy status: Secondary | ICD-10-CM | POA: Diagnosis not present

## 2015-03-26 DIAGNOSIS — C779 Secondary and unspecified malignant neoplasm of lymph node, unspecified: Secondary | ICD-10-CM | POA: Diagnosis present

## 2015-03-26 DIAGNOSIS — E559 Vitamin D deficiency, unspecified: Secondary | ICD-10-CM | POA: Diagnosis present

## 2015-03-26 DIAGNOSIS — R739 Hyperglycemia, unspecified: Secondary | ICD-10-CM | POA: Diagnosis present

## 2015-03-26 DIAGNOSIS — C140 Malignant neoplasm of pharynx, unspecified: Secondary | ICD-10-CM

## 2015-03-26 DIAGNOSIS — F1010 Alcohol abuse, uncomplicated: Secondary | ICD-10-CM | POA: Diagnosis present

## 2015-03-26 DIAGNOSIS — Z931 Gastrostomy status: Secondary | ICD-10-CM | POA: Diagnosis not present

## 2015-03-26 DIAGNOSIS — C329 Malignant neoplasm of larynx, unspecified: Secondary | ICD-10-CM | POA: Diagnosis not present

## 2015-03-26 DIAGNOSIS — K219 Gastro-esophageal reflux disease without esophagitis: Secondary | ICD-10-CM | POA: Diagnosis present

## 2015-03-26 DIAGNOSIS — F1210 Cannabis abuse, uncomplicated: Secondary | ICD-10-CM | POA: Diagnosis present

## 2015-03-26 DIAGNOSIS — Z430 Encounter for attention to tracheostomy: Secondary | ICD-10-CM | POA: Insufficient documentation

## 2015-03-26 DIAGNOSIS — E538 Deficiency of other specified B group vitamins: Secondary | ICD-10-CM | POA: Diagnosis not present

## 2015-03-26 DIAGNOSIS — E638 Other specified nutritional deficiencies: Secondary | ICD-10-CM | POA: Diagnosis present

## 2015-03-26 DIAGNOSIS — J9690 Respiratory failure, unspecified, unspecified whether with hypoxia or hypercapnia: Secondary | ICD-10-CM

## 2015-03-26 DIAGNOSIS — K083 Retained dental root: Secondary | ICD-10-CM | POA: Diagnosis present

## 2015-03-26 DIAGNOSIS — K0530 Chronic periodontitis, unspecified: Secondary | ICD-10-CM | POA: Diagnosis present

## 2015-03-26 DIAGNOSIS — R1310 Dysphagia, unspecified: Secondary | ICD-10-CM | POA: Diagnosis present

## 2015-03-26 DIAGNOSIS — E43 Unspecified severe protein-calorie malnutrition: Secondary | ICD-10-CM

## 2015-03-26 DIAGNOSIS — C321 Malignant neoplasm of supraglottis: Secondary | ICD-10-CM | POA: Diagnosis present

## 2015-03-26 DIAGNOSIS — K045 Chronic apical periodontitis: Secondary | ICD-10-CM

## 2015-03-26 DIAGNOSIS — C139 Malignant neoplasm of hypopharynx, unspecified: Secondary | ICD-10-CM | POA: Diagnosis not present

## 2015-03-26 DIAGNOSIS — D62 Acute posthemorrhagic anemia: Secondary | ICD-10-CM | POA: Diagnosis not present

## 2015-03-26 DIAGNOSIS — C7989 Secondary malignant neoplasm of other specified sites: Secondary | ICD-10-CM | POA: Diagnosis present

## 2015-03-26 DIAGNOSIS — Z681 Body mass index (BMI) 19 or less, adult: Secondary | ICD-10-CM

## 2015-03-26 DIAGNOSIS — K029 Dental caries, unspecified: Secondary | ICD-10-CM | POA: Diagnosis present

## 2015-03-26 DIAGNOSIS — R339 Retention of urine, unspecified: Secondary | ICD-10-CM | POA: Diagnosis not present

## 2015-03-26 DIAGNOSIS — E876 Hypokalemia: Secondary | ICD-10-CM | POA: Diagnosis not present

## 2015-03-26 DIAGNOSIS — F17210 Nicotine dependence, cigarettes, uncomplicated: Secondary | ICD-10-CM | POA: Diagnosis present

## 2015-03-26 DIAGNOSIS — C760 Malignant neoplasm of head, face and neck: Secondary | ICD-10-CM | POA: Diagnosis not present

## 2015-03-26 HISTORY — DX: Unspecified convulsions: R56.9

## 2015-03-26 HISTORY — PX: TRACHEOSTOMY TUBE PLACEMENT: SHX814

## 2015-03-26 HISTORY — PX: TOOTH EXTRACTION: SHX859

## 2015-03-26 HISTORY — DX: Reserved for inherently not codable concepts without codable children: IMO0001

## 2015-03-26 HISTORY — DX: Gastro-esophageal reflux disease without esophagitis: K21.9

## 2015-03-26 HISTORY — PX: MULTIPLE EXTRACTIONS WITH ALVEOLOPLASTY: SHX5342

## 2015-03-26 LAB — BASIC METABOLIC PANEL
ANION GAP: 19 — AB (ref 5–15)
BUN: 9 mg/dL (ref 6–23)
CALCIUM: 10.2 mg/dL (ref 8.4–10.5)
CO2: 24 mmol/L (ref 19–32)
Chloride: 94 mmol/L — ABNORMAL LOW (ref 96–112)
Creatinine, Ser: 0.79 mg/dL (ref 0.50–1.35)
GFR calc Af Amer: >90 mL/min (ref >90)
GFR calc non Af Amer: >90 mL/min (ref >90)
Glucose, Bld: 60 mg/dL — ABNORMAL LOW (ref 70–99)
Potassium: 3.3 mmol/L — ABNORMAL LOW (ref 3.5–5.1)
Sodium: 137 mmol/L (ref 135–145)

## 2015-03-26 LAB — CBC
HEMATOCRIT: 41.1 % (ref 39.0–52.0)
HEMOGLOBIN: 13.9 g/dL (ref 13.0–17.0)
MCH: 33.2 pg (ref 26.0–34.0)
MCHC: 33.8 g/dL (ref 30.0–36.0)
MCV: 98.1 fL (ref 78.0–100.0)
Platelets: 308 10*3/uL (ref 150–400)
RBC: 4.19 MIL/uL — ABNORMAL LOW (ref 4.22–5.81)
RDW: 15.1 % (ref 11.5–15.5)
WBC: 16.3 10*3/uL — AB (ref 4.0–10.5)

## 2015-03-26 LAB — GLUCOSE, CAPILLARY: GLUCOSE-CAPILLARY: 114 mg/dL — AB (ref 70–99)

## 2015-03-26 SURGERY — CREATION, TRACHEOSTOMY
Anesthesia: General | Site: Throat

## 2015-03-26 MED ORDER — FENTANYL CITRATE 0.05 MG/ML IJ SOLN
INTRAMUSCULAR | Status: DC | PRN
  Administered 2015-03-26 (×3): 50 ug via INTRAVENOUS

## 2015-03-26 MED ORDER — NICOTINE 21 MG/24HR TD PT24
21.0000 mg | MEDICATED_PATCH | Freq: Every day | TRANSDERMAL | Status: DC
  Administered 2015-03-26 – 2015-04-07 (×14): 21 mg via TRANSDERMAL
  Filled 2015-03-26 (×14): qty 1

## 2015-03-26 MED ORDER — ONDANSETRON HCL 4 MG/2ML IJ SOLN
INTRAMUSCULAR | Status: AC
  Filled 2015-03-26: qty 4

## 2015-03-26 MED ORDER — BUPIVACAINE-EPINEPHRINE 0.5% -1:200000 IJ SOLN
INTRAMUSCULAR | Status: DC | PRN
  Administered 2015-03-26 (×2): 1.8 mL

## 2015-03-26 MED ORDER — LACTATED RINGERS IV SOLN
INTRAVENOUS | Status: DC
  Administered 2015-03-26 – 2015-03-27 (×6): via INTRAVENOUS
  Administered 2015-03-27: 75 mL/h via INTRAVENOUS
  Administered 2015-03-28 – 2015-03-29 (×2): via INTRAVENOUS
  Administered 2015-03-29: 1 via INTRAVENOUS
  Administered 2015-03-30 (×2): via INTRAVENOUS

## 2015-03-26 MED ORDER — EPHEDRINE SULFATE 50 MG/ML IJ SOLN
INTRAMUSCULAR | Status: AC
  Filled 2015-03-26: qty 1

## 2015-03-26 MED ORDER — LIDOCAINE-EPINEPHRINE 2 %-1:100000 IJ SOLN
INTRAMUSCULAR | Status: AC
  Filled 2015-03-26: qty 3.4

## 2015-03-26 MED ORDER — KETAMINE HCL 10 MG/ML IJ SOLN
INTRAMUSCULAR | Status: DC | PRN
  Administered 2015-03-26 (×2): 30 mg via INTRAVENOUS

## 2015-03-26 MED ORDER — LIDOCAINE-EPINEPHRINE 1 %-1:100000 IJ SOLN
INTRAMUSCULAR | Status: AC
  Filled 2015-03-26: qty 1

## 2015-03-26 MED ORDER — LIDOCAINE-EPINEPHRINE 1 %-1:100000 IJ SOLN
INTRAMUSCULAR | Status: DC | PRN
  Administered 2015-03-26: 20 mL

## 2015-03-26 MED ORDER — 0.9 % SODIUM CHLORIDE (POUR BTL) OPTIME
TOPICAL | Status: DC | PRN
  Administered 2015-03-26: 1000 mL

## 2015-03-26 MED ORDER — MORPHINE SULFATE 2 MG/ML IJ SOLN: 2.0000 mg | INTRAMUSCULAR | Status: DC | PRN

## 2015-03-26 MED ORDER — LORAZEPAM 1 MG PO TABS: 1.0000 mg | ORAL_TABLET | Freq: Four times a day (QID) | ORAL | Status: AC | PRN

## 2015-03-26 MED ORDER — BUPIVACAINE-EPINEPHRINE (PF) 0.5% -1:200000 IJ SOLN
INTRAMUSCULAR | Status: AC
  Filled 2015-03-26: qty 3.6

## 2015-03-26 MED ORDER — FOLIC ACID 1 MG PO TABS
1.0000 mg | ORAL_TABLET | Freq: Every day | ORAL | Status: DC
  Filled 2015-03-26: qty 1

## 2015-03-26 MED ORDER — MIDAZOLAM HCL 2 MG/2ML IJ SOLN
INTRAMUSCULAR | Status: AC
  Filled 2015-03-26: qty 2

## 2015-03-26 MED ORDER — OXYMETAZOLINE HCL 0.05 % NA SOLN
NASAL | Status: AC
  Filled 2015-03-26: qty 15

## 2015-03-26 MED ORDER — HYDROMORPHONE HCL 1 MG/ML IJ SOLN
1.0000 mg | INTRAMUSCULAR | Status: DC | PRN
Start: 2015-03-26 — End: 2015-03-26
  Administered 2015-03-26: 1 mg via INTRAVENOUS
  Filled 2015-03-26: qty 1

## 2015-03-26 MED ORDER — ONDANSETRON HCL 4 MG/2ML IJ SOLN
INTRAMUSCULAR | Status: AC
  Filled 2015-03-26: qty 2

## 2015-03-26 MED ORDER — LACTATED RINGERS IV SOLN: INTRAVENOUS | Status: DC

## 2015-03-26 MED ORDER — PROPOFOL 10 MG/ML IV BOLUS
INTRAVENOUS | Status: AC
  Filled 2015-03-26: qty 20

## 2015-03-26 MED ORDER — PHENYLEPHRINE HCL 10 MG/ML IJ SOLN
INTRAMUSCULAR | Status: DC | PRN
  Administered 2015-03-26: 120 ug via INTRAVENOUS
  Administered 2015-03-26: 80 ug via INTRAVENOUS
  Administered 2015-03-26: 120 ug via INTRAVENOUS
  Administered 2015-03-26: 80 ug via INTRAVENOUS

## 2015-03-26 MED ORDER — ROCURONIUM BROMIDE 100 MG/10ML IV SOLN
INTRAVENOUS | Status: DC | PRN
  Administered 2015-03-26: 40 mg via INTRAVENOUS

## 2015-03-26 MED ORDER — POTASSIUM CHLORIDE 10 MEQ/100ML IV SOLN
10.0000 meq | INTRAVENOUS | Status: AC
  Administered 2015-03-26 – 2015-03-27 (×4): 10 meq via INTRAVENOUS
  Filled 2015-03-26 (×3): qty 100

## 2015-03-26 MED ORDER — LIDOCAINE HCL (CARDIAC) 20 MG/ML IV SOLN
INTRAVENOUS | Status: DC | PRN
  Administered 2015-03-26: 20 mg via INTRAVENOUS

## 2015-03-26 MED ORDER — OXYMETAZOLINE HCL 0.05 % NA SOLN
NASAL | Status: DC | PRN
  Administered 2015-03-26: 1 via NASAL

## 2015-03-26 MED ORDER — DEXAMETHASONE SODIUM PHOSPHATE 4 MG/ML IJ SOLN
INTRAMUSCULAR | Status: AC
  Filled 2015-03-26: qty 2

## 2015-03-26 MED ORDER — CHLORHEXIDINE GLUCONATE 0.12 % MT SOLN
5.0000 mL | Freq: Four times a day (QID) | OROMUCOSAL | Status: DC
Start: 2015-03-26 — End: 2015-03-27
  Administered 2015-03-27: 5 mL via OROMUCOSAL
  Filled 2015-03-26: qty 15

## 2015-03-26 MED ORDER — LORAZEPAM 2 MG/ML IJ SOLN
1.0000 mg | Freq: Four times a day (QID) | INTRAMUSCULAR | Status: AC | PRN
  Administered 2015-03-27 – 2015-03-29 (×4): 1 mg via INTRAVENOUS
  Filled 2015-03-26 (×4): qty 1

## 2015-03-26 MED ORDER — LIDOCAINE-EPINEPHRINE 2 %-1:100000 IJ SOLN
1.7000 mL | INTRAMUSCULAR | Status: DC | PRN
  Administered 2015-03-26 (×4): 1.7 mL
  Filled 2015-03-26 (×5): qty 1.7

## 2015-03-26 MED ORDER — CHLORHEXIDINE GLUCONATE 4 % EX LIQD
1.0000 "application " | Freq: Once | CUTANEOUS | Status: DC
  Filled 2015-03-26: qty 15

## 2015-03-26 MED ORDER — BOOST / RESOURCE BREEZE PO LIQD
1.0000 | Freq: Three times a day (TID) | ORAL | Status: DC
  Administered 2015-03-27 – 2015-03-29 (×7): 1 via ORAL

## 2015-03-26 MED ORDER — GLYCOPYRROLATE 0.2 MG/ML IJ SOLN
INTRAMUSCULAR | Status: AC
  Filled 2015-03-26: qty 3

## 2015-03-26 MED ORDER — LACTATED RINGERS IV SOLN
INTRAVENOUS | Status: DC
  Administered 2015-03-26: 18:00:00 via INTRAVENOUS

## 2015-03-26 MED ORDER — ROCURONIUM BROMIDE 50 MG/5ML IV SOLN
INTRAVENOUS | Status: AC
  Filled 2015-03-26: qty 1

## 2015-03-26 MED ORDER — PANTOPRAZOLE SODIUM 40 MG IV SOLR
40.0000 mg | INTRAVENOUS | Status: DC
  Administered 2015-03-26: 40 mg via INTRAVENOUS
  Filled 2015-03-26 (×2): qty 40

## 2015-03-26 MED ORDER — VITAMIN B-1 100 MG PO TABS
100.0000 mg | ORAL_TABLET | Freq: Every day | ORAL | Status: DC
  Filled 2015-03-26 (×7): qty 1

## 2015-03-26 MED ORDER — ONDANSETRON HCL 4 MG/2ML IJ SOLN
INTRAMUSCULAR | Status: DC | PRN
  Administered 2015-03-26: 4 mg via INTRAVENOUS

## 2015-03-26 MED ORDER — ENOXAPARIN SODIUM 40 MG/0.4ML ~~LOC~~ SOLN
40.0000 mg | SUBCUTANEOUS | Status: DC
  Administered 2015-03-26 – 2015-03-28 (×3): 40 mg via SUBCUTANEOUS
  Filled 2015-03-26 (×4): qty 0.4

## 2015-03-26 MED ORDER — FENTANYL CITRATE (PF) 100 MCG/2ML IJ SOLN
25.0000 ug | INTRAMUSCULAR | Status: DC | PRN
  Administered 2015-03-26 – 2015-03-28 (×9): 100 ug via INTRAVENOUS
  Filled 2015-03-26 (×9): qty 2

## 2015-03-26 MED ORDER — ERGOCALCIFEROL 8000 UNIT/ML PO SOLN
50000.0000 [IU] | ORAL | Status: DC
  Administered 2015-03-27 – 2015-04-03 (×2): 50000 [IU] via ORAL
  Filled 2015-03-26 (×2): qty 6.25

## 2015-03-26 MED ORDER — GLYCOPYRROLATE 0.2 MG/ML IJ SOLN
INTRAMUSCULAR | Status: DC | PRN
  Administered 2015-03-26: 0.6 mg via INTRAVENOUS

## 2015-03-26 MED ORDER — ADULT MULTIVITAMIN W/MINERALS CH
1.0000 | ORAL_TABLET | Freq: Every day | ORAL | Status: DC
  Filled 2015-03-26 (×3): qty 1

## 2015-03-26 MED ORDER — LIDOCAINE HCL (CARDIAC) 20 MG/ML IV SOLN
INTRAVENOUS | Status: AC
  Filled 2015-03-26: qty 5

## 2015-03-26 MED ORDER — HYDRALAZINE HCL 20 MG/ML IJ SOLN
10.0000 mg | INTRAMUSCULAR | Status: DC | PRN
  Administered 2015-03-27: 20 mg via INTRAVENOUS
  Filled 2015-03-26: qty 1

## 2015-03-26 MED ORDER — CEFAZOLIN SODIUM-DEXTROSE 2-3 GM-% IV SOLR
2.0000 g | INTRAVENOUS | Status: AC
  Administered 2015-03-26: 2 g via INTRAVENOUS
  Filled 2015-03-26: qty 50

## 2015-03-26 MED ORDER — FENTANYL CITRATE 0.05 MG/ML IJ SOLN
INTRAMUSCULAR | Status: AC
  Filled 2015-03-26: qty 5

## 2015-03-26 MED ORDER — KETAMINE HCL 100 MG/ML IJ SOLN
INTRAMUSCULAR | Status: AC
  Filled 2015-03-26: qty 1

## 2015-03-26 MED ORDER — SODIUM CHLORIDE 0.9 % IJ SOLN
INTRAMUSCULAR | Status: AC
  Filled 2015-03-26: qty 10

## 2015-03-26 MED ORDER — LIDOCAINE-EPINEPHRINE 2 %-1:100000 IJ SOLN
INTRAMUSCULAR | Status: AC
  Filled 2015-03-26: qty 10.2

## 2015-03-26 MED ORDER — THIAMINE HCL 100 MG/ML IJ SOLN
100.0000 mg | Freq: Every day | INTRAMUSCULAR | Status: DC
  Administered 2015-03-27 – 2015-04-07 (×11): 100 mg via INTRAVENOUS
  Filled 2015-03-26: qty 1
  Filled 2015-03-26 (×2): qty 2
  Filled 2015-03-26 (×5): qty 1
  Filled 2015-03-26 (×3): qty 2
  Filled 2015-03-26: qty 1

## 2015-03-26 MED ORDER — PROPOFOL 10 MG/ML IV BOLUS
INTRAVENOUS | Status: DC | PRN
  Administered 2015-03-26: 100 mg via INTRAVENOUS

## 2015-03-26 MED ORDER — LIDOCAINE-EPINEPHRINE 2 %-1:100000 IJ SOLN
INTRAMUSCULAR | Status: DC | PRN
  Administered 2015-03-26 (×4): 1.7 mL via INTRADERMAL

## 2015-03-26 MED ORDER — POTASSIUM CHLORIDE 20 MEQ/15ML (10%) PO SOLN
40.0000 meq | Freq: Once | ORAL | Status: DC
  Filled 2015-03-26: qty 30

## 2015-03-26 MED ORDER — NEOSTIGMINE METHYLSULFATE 10 MG/10ML IV SOLN
INTRAVENOUS | Status: DC | PRN
  Administered 2015-03-26: 4 mg via INTRAVENOUS

## 2015-03-26 MED ORDER — MIDAZOLAM HCL 5 MG/5ML IJ SOLN
INTRAMUSCULAR | Status: DC | PRN
  Administered 2015-03-26: 1 mg via INTRAVENOUS
  Administered 2015-03-26: 2 mg via INTRAVENOUS
  Administered 2015-03-26: 1 mg via INTRAVENOUS

## 2015-03-26 MED ORDER — DEXTROSE 5 % IV SOLN
10.0000 mg | INTRAVENOUS | Status: DC | PRN
  Administered 2015-03-26: 50 ug/min via INTRAVENOUS

## 2015-03-26 SURGICAL SUPPLY — 60 items
ALCOHOL 70% 16 OZ (MISCELLANEOUS) ×5 IMPLANT
ATTRACTOMAT 16X20 MAGNETIC DRP (DRAPES) ×10 IMPLANT
BLADE SURG 15 STRL LF DISP TIS (BLADE) ×6 IMPLANT
BLADE SURG 15 STRL SS (BLADE) ×4
BLADE SURG ROTATE 9660 (MISCELLANEOUS) IMPLANT
CANISTER SUCTION 2500CC (MISCELLANEOUS) ×5 IMPLANT
CLEANER TIP ELECTROSURG 2X2 (MISCELLANEOUS) ×5 IMPLANT
COVER SURGICAL LIGHT HANDLE (MISCELLANEOUS) ×10 IMPLANT
CRADLE DONUT ADULT HEAD (MISCELLANEOUS) IMPLANT
DECANTER SPIKE VIAL GLASS SM (MISCELLANEOUS) ×5 IMPLANT
DRAPE PROXIMA HALF (DRAPES) IMPLANT
ELECT COATED BLADE 2.86 ST (ELECTRODE) ×5 IMPLANT
ELECT REM PT RETURN 9FT ADLT (ELECTROSURGICAL) ×5
ELECTRODE REM PT RTRN 9FT ADLT (ELECTROSURGICAL) ×3 IMPLANT
GAUZE PACKING FOLDED 2  STR (GAUZE/BANDAGES/DRESSINGS) ×2
GAUZE PACKING FOLDED 2 STR (GAUZE/BANDAGES/DRESSINGS) ×3 IMPLANT
GAUZE SPONGE 4X4 16PLY XRAY LF (GAUZE/BANDAGES/DRESSINGS) ×15 IMPLANT
GLOVE BIO SURGEON STRL SZ7.5 (GLOVE) ×5 IMPLANT
GLOVE BIOGEL PI IND STRL 6 (GLOVE) ×6 IMPLANT
GLOVE BIOGEL PI IND STRL 7.0 (GLOVE) ×9 IMPLANT
GLOVE BIOGEL PI INDICATOR 6 (GLOVE) ×4
GLOVE BIOGEL PI INDICATOR 7.0 (GLOVE) ×6
GLOVE ECLIPSE 8.0 STRL XLNG CF (GLOVE) ×10 IMPLANT
GLOVE SURG ORTHO 8.0 STRL STRW (GLOVE) ×5 IMPLANT
GLOVE SURG SS PI 6.0 STRL IVOR (GLOVE) ×10 IMPLANT
GLOVE SURG SS PI 7.0 STRL IVOR (GLOVE) ×10 IMPLANT
GOWN STRL REUS W/ TWL LRG LVL3 (GOWN DISPOSABLE) ×6 IMPLANT
GOWN STRL REUS W/ TWL XL LVL3 (GOWN DISPOSABLE) ×3 IMPLANT
GOWN STRL REUS W/TWL 2XL LVL3 (GOWN DISPOSABLE) ×5 IMPLANT
GOWN STRL REUS W/TWL LRG LVL3 (GOWN DISPOSABLE) ×4
GOWN STRL REUS W/TWL XL LVL3 (GOWN DISPOSABLE) ×2
HEMOSTAT SURGICEL 2X14 (HEMOSTASIS) ×5 IMPLANT
KIT BASIN OR (CUSTOM PROCEDURE TRAY) ×10 IMPLANT
KIT ROOM TURNOVER OR (KITS) ×10 IMPLANT
MANIFOLD NEPTUNE WASTE (CANNULA) ×5 IMPLANT
NEEDLE 27GAX1/2IN MONOJET (NEEDLE) ×10 IMPLANT
NEEDLE BLUNT 16X1.5 OR ONLY (NEEDLE) ×5 IMPLANT
NEEDLE HYPO 25GX1X1/2 BEV (NEEDLE) IMPLANT
NS IRRIG 1000ML POUR BTL (IV SOLUTION) ×10 IMPLANT
PACK EENT II TURBAN DRAPE (CUSTOM PROCEDURE TRAY) ×10 IMPLANT
PAD ARMBOARD 7.5X6 YLW CONV (MISCELLANEOUS) ×15 IMPLANT
PENCIL BUTTON HOLSTER BLD 10FT (ELECTRODE) ×5 IMPLANT
SPONGE DRAIN TRACH 4X4 STRL 2S (GAUZE/BANDAGES/DRESSINGS) ×5 IMPLANT
SPONGE SURGIFOAM ABS GEL 100 (HEMOSTASIS) IMPLANT
SPONGE SURGIFOAM ABS GEL 12-7 (HEMOSTASIS) IMPLANT
SPONGE SURGIFOAM ABS GEL SZ50 (HEMOSTASIS) IMPLANT
SUCTION FRAZIER TIP 10 FR DISP (SUCTIONS) ×5 IMPLANT
SUT CHROMIC 2 0 SH (SUTURE) ×5 IMPLANT
SUT CHROMIC 3 0 PS 2 (SUTURE) ×25 IMPLANT
SUT ETHILON 2 0 FS 18 (SUTURE) IMPLANT
SUT SILK 2 0 SH CR/8 (SUTURE) ×5 IMPLANT
SYR 50ML SLIP (SYRINGE) ×5 IMPLANT
TOWEL OR 17X24 6PK STRL BLUE (TOWEL DISPOSABLE) ×5 IMPLANT
TOWEL OR 17X26 10 PK STRL BLUE (TOWEL DISPOSABLE) ×5 IMPLANT
TRAY ENT MC OR (CUSTOM PROCEDURE TRAY) ×5 IMPLANT
TUBE CONNECTING 12'X1/4 (SUCTIONS) ×1
TUBE CONNECTING 12X1/4 (SUCTIONS) ×4 IMPLANT
TUBE TRACH SHILEY  6 DIST  CUF (TUBING) ×5 IMPLANT
WATER STERILE IRR 1000ML POUR (IV SOLUTION) ×10 IMPLANT
YANKAUER SUCT BULB TIP NO VENT (SUCTIONS) ×10 IMPLANT

## 2015-03-26 NOTE — Op Note (Signed)
03/26/2015 12:14 PM  Jeri Lager [id number]  Pre-Op Dx: Stage IV pharyngeal cancer  Post-Op Dx:  same  Proc:  Tracheostomy  Surg:  [name]  Anes:  GOT  EBL:  min  Comp:  none  Findings:  Thin neck with bilateral nodes.  Small thyroid isthmus.  Procedure:  The patient was brought from the holding area to the operating room and transferred to an operating table.  Anesthesia was prepared for difficult airway options and did successfully intubate using a Glide scope  The patient was placed in a slight reverse Trendelenburg.  Neck extension was achieved as possible.  The lower neck was palpated with the findings as described above.  1% Xylocaine with 1:100,000 epinephrine, 10 cc's, was infiltrated into the surgical field for intraoperative hemostasis. (this was actually done prior to intubation in preparation for possible awake tracheostomy)  Several minutes were allowed for this to take effect.  A Hibiclens sterile preparation  of the lower neck and upper chest was performed in the standard fashion.  Sterile draping was accomplished in the standard fashion.  A  3 cm transverse incision was made sharply approximately halfway between the sternal notch and cricoid cartilage and extended through skin and subcutaneous fat.  Using cautery, the superficial layer of the deep cervical fascia was lysed.  Additional dissection revealed the strap muscles.  The midline raphe was divided in two layers and the muscles retracted laterally.  The pretracheal plane was visualized.  This was entered bluntly.  The thyroid isthmus was isolated between hemostats, divided, and controlled with 2-0 silk suture ligatures.  The thyroid gland was retracted to either side.  The anterior face of the trachea was cleared.  In the  2-3 interspace, a transverse incision was made between cartilage rings into the tracheal lumen.  The lower tracheal ring was secured to the lower wound with a 2-0 chromic suture.  Mucosal edges  were cauterized for hemostasis.  A previously tested  # 6 Shiley cuffed tracheostomy tube was brought into the field.  With the endotracheal tube under direct visualization through the tracheostomy, it was gently backed up.  The tracheostomy tube was inserted into the tracheal lumen.  Hemostasis was observed. The cuff was inflated and observed to be intact and containing pressure. The inner cannula was placed and ventilation assumed per tracheostomy tube.  Good chest wall motion was observed, and CO2 return was documented per anesthesia.  The trach tube was secured in the standard fashion with cotton twill ties.  Hemostasis was observed again.  When satisfactory ventilation was assured, the orotracheal tube was removed.  At this point the procedure was completed.  The patient was turned over to Dr. Enrique Sack for full mouth dental extractions.  Comment: 54 y.o. bm with Stage IV pharyngeal cancer was the indication for today's procedure.  Anticipate a routine postoperative recovery including standard tracheal hygiene.  We will change the trach ties at four days but not use Velcro ties until seven days.  When the patient no longer requires ventilator or pressure support, the cuff should be deflated.  Change to an uncuffed tube and downsizing will be according to the clinical condition of the patient.   He will be admitted on the Internal medicine service and will need a Gastrostomy tube in anticipation of Radiation Therapy treatment.

## 2015-03-26 NOTE — Progress Notes (Signed)
Lunch relief by M. Lovena Le RN

## 2015-03-26 NOTE — Interval H&P Note (Signed)
History and Physical Interval Note:  03/26/2015 10:25 AM  Kyle Alexander  has presented today for surgery, with the diagnosis of STAGE 4 PHARYNGEAL CANCER    The various methods of treatment have been discussed with the patient and family. After consideration of risks, benefits and other options for treatment, the patient has consented to  Procedure(s): TRACHEOSTOMY POSSIBLE AWAKE TRACHEOSTOMY POSSIBLE DENTAL EXTRACTIONS  (N/A) MULTIPLE EXTRACTION WITH ALVEOLOPLASTY (N/A) as a surgical intervention .  The patient's history has been re-reviewed, patient re-examined, no change in status, stable for surgery.  I have re-reviewed the patient's chart and labs.  Questions were answered to the patient's satisfaction.     [name]

## 2015-03-26 NOTE — Telephone Encounter (Signed)
Call to patient to confirm appointment for 03/27/15 at 3:15 lmtcb

## 2015-03-26 NOTE — Transfer of Care (Signed)
Immediate Anesthesia Transfer of Care Note  Patient: Kyle Alexander  Procedure(s) Performed: Procedure(s): TRACHEOSTOMY  (N/A) EXTRACTION OF TOOTH #'S 1,2,5,6,7,8,9,10,11,12,13,18,19,21,22,23,24,25,26,27,28,29  WITH AVELOPLASTY (N/A) RFK  Patient Location: PACU  Anesthesia Type:General  Level of Consciousness: awake, alert , oriented and patient cooperative  Airway & Oxygen Therapy: Patient Spontanous Breathing and Patient connected to tracheostomy mask oxygen  Post-op Assessment: Report given to RN and Post -op Vital signs reviewed and stable  Post vital signs: Reviewed and stable  Last Vitals:  Filed Vitals:   03/26/15 0811  BP: 118/82  Pulse: 109  Temp: 36.9 C  Resp: 16    Complications: No apparent anesthesia complications

## 2015-03-26 NOTE — Op Note (Signed)
OPERATIVE REPORT  Patient:            Kyle Alexander Date of Birth:  Jun 13, 1961 MRN:                [id number]   DATE OF PROCEDURE:  03/26/2015  PREOPERATIVE DIAGNOSES: 1.   Carcinoma of the supraglottis  2.   Status post tracheostomy 3.   Preradiation therapy dental protocol 4.   Chronic apical periodontitis 5.   Multiple retained root segments 6.   Chronic periodontitis 7.   Tooth mobility  POSTOPERATIVE DIAGNOSES: 1.   Carcinoma of the supraglottis  2.   Status post tracheostomy 3.   Preradiation therapy dental protocol 4.   Chronic apical periodontitis 5.   Multiple retained root segments 6.   Chronic periodontitis 7.   Tooth mobility  OPERATIONS: 1. Multiple extraction of tooth numbers 1, 2, 5, 6, 7, 8, 9, 10, 11, 12, 13, 18, 19, 21, 22, 23, 24, 25, 26, 27, 28, and 29. 2. 4 Quadrants of alveoloplasty   SURGEON: Lenn Cal, DDS  ASSISTANT: Camie Patience, (dental assistant)  ANESTHESIA: General anesthesia via tracheostomy site.   MEDICATIONS: 1. Ancef 2 g IV prior to invasive dental procedures. 2. Local anesthesia with a total utilization of 4 carpules each containing 34 mg of lidocaine with 0.017 mg of epinephrine as well as 2 carpules each containing 9 mg of bupivacaine with 0.009 mg of epinephrine.  SPECIMENS: There are 22 teeth that were discarded.  DRAINS: None  CULTURES: None  COMPLICATIONS: None   ESTIMATED BLOOD LOSS: 100 mLs.  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  A medically necessary dental consultation was then requested prior to anticipated radiation therapy.  The patient was examined and treatment planned for extraction of remaining teeth with alveoloplasty in the operating room.  This treatment plan was formulated to decrease the risks and complications associated with dental infection from affecting the patient's systemic health and  to prevent future complications such as osteoradionecrosis.  OPERATIVE FINDINGS: Patient was examined operating room number 8.  The teeth were identified for extraction. The patient was noted be affected by chronic periodontitis, chronic apical periodontitis, multiple retained root segments, dental caries, and multiple loose teeth.   DESCRIPTION OF PROCEDURE: Patient was brought to the main operating room number 8 by Dr. [name]. Dr. [name] then performed a tracheostomy procedure as per his operative note. Patient was then ready for the dental medicine procedure in the operating room under general anesthesia through the tracheostomy site. The patient was then prepped and draped in the usual manner for dental medicine procedure. A timeout was performed. The patient was identified and procedures were verified. A throat pack was placed at this time. The oral cavity was then thoroughly examined with the findings noted above. The patient was then ready for dental medicine procedure as follows:  Local anesthesia was then administered sequentially with a total utilization of 4 carpules each containing 34 mg of lidocaine with 0.017 mg of epinephrine as well as 2 carpules  each containing 9 mg bupivacaine with 0.009 mg of epinephrine.  The Maxillary left and right quadrants first approached. Anesthesia was then delivered utilizing infiltration with lidocaine with epinephrine. A #15 blade incision was then made from the maxillary right tuberosity and extended to the distal of #14.  A  surgical flap was then carefully reflected. The teeth were then subluxated with a series of  straight elevators. Tooth numbers 1, 2, 5, 6, 7, 8, 9, 10, 11, 12, and 13 were then removed with a 150 forceps without complications. Alveoloplasty was then performed utilizing a ronguers and bone file. The surgical site was then irrigated with copious amounts of sterile saline. The tissues were approximated and trimmed appropriately. The  maxillary right surgical site was then closed from the maxillary tuberosity and extended to the mesial of #8 utilizing 3-0 chromic gut suture in a continuous interrupted suture technique 1. The maxillary left surgical site was then closed from the distal of 14 extended the mesial #9 utilizing 3-0 chromic gut suture in a continuous interrupted suture technique 1. 2 individual interrupted sutures were then placed to further close the surgical site.   At this point time, the mandibular quadrants were approached. The patient was given bilateral inferior alveolar nerve blocks and long buccal nerve blocks utilizing the bupivacaine with epinephrine. Further infiltration was then achieved utilizing the lidocaine with epinephrine. A 15 blade incision was then made from the distal of number 17 and extended to the distal of #31.  A surgical flap was then carefully reflected. Appropriate amounts of buccal and interseptal bone were then removed utilizing a surgical handpiece and copious amount of sterile water around tooth numbers 18, 19, 21, 22, 27, 28, and 29. The lower teeth were then subluxated with a series of straight elevators. Tooth numbers 18, 19, were then removed with a 17 forceps. Tooth numbers 21, 22, 23, 24, 25, 26, 27, 28, and 29 were then removed utilizing a 151 forceps without complications. Alveoloplasty was then performed utilizing a rongeurs and bone file. The tissues were approximated and trimmed appropriately. The surgical sites were then irrigated with copious amounts of sterile saline. The mandibular left surgical site was then closed from the distal of 17 and extended the mesial #24 utilizing 3-0 chromic gut suture in a continuous interrupted suture technique 1. The mandibular right surgical site was then closed from the distal of #31 and extended the mesial #25 utilizing 3-0 chromic gut suture in a continuous of the suture technique 1. 3 individual interrupted sutures then placed to further close  the distal anterior surgical site.   At this point time, the entire mouth was irrigated with copious amounts of sterile saline. The patient was examined for complications, seeing none, the dental medicine procedure was deemed to be complete. The throat pack was removed at this time.  A series of 4 x 4 gauze were placed in the mouth to aid hemostasis. The patient was then handed over to the anesthesia team for final disposition. After an appropriate amount of time, the patient had anesthesia reversed and the patient was taken to the postanesthesia care unit in good condition. All counts were correct for the dental medicine procedure. Patient to be admitted by Internal Medicine after review of Dr. Noreene Filbert operative note.   Lenn Cal, DDS.

## 2015-03-26 NOTE — Anesthesia Postprocedure Evaluation (Signed)
  Anesthesia Post-op Note  Patient: Kyle Alexander  Procedure(s) Performed: Procedure(s): TRACHEOSTOMY  (N/A) EXTRACTION OF TOOTH #'S 1,2,5,6,7,8,9,10,11,12,13,18,19,21,22,23,24,25,26,27,28,29  WITH AVELOPLASTY (N/A) RFK  Patient Location: PACU  Anesthesia Type:General  Level of Consciousness: awake and alert   Airway and Oxygen Therapy: Patient connected to tracheostomy mask oxygen  Post-op Pain: mild  Post-op Assessment: Post-op Vital signs reviewed  Post-op Vital Signs: Reviewed  Last Vitals:  Filed Vitals:   03/26/15 1606  BP: 118/71  Pulse: 86  Temp: 36.3 C  Resp: 20    Complications: No apparent anesthesia complications

## 2015-03-26 NOTE — H&P (View-Only) (Signed)
Kyle Alexander, Kyle Alexander 54 y.o., male [id number]     Chief Complaint: breathing difficulty  HPI: Two-week recheck.  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  The plan is for palliative radiation.  He was unable to lay down flat last week for stimulation.  He was unable to lay down flat yesterday for PET scan.  He did see Dr. Enrique Sack DDS this morning and is planning for full mouth extraction.  We have him on scheduled tomorrow morning for tracheostomy, possible awake tracheostomy.  He is sleeping poorly and eating poorly.  He is breathing fairly well except when he gets thick phlegm and then he has trouble.  Comparing our scale to the hospital scales, he has lost 6 more pounds.  He does continue to smoke.     I discussed the surgery in detail including risks and complications.  Questions were answered and informed consent was obtained.  I also discussed the hospitalization.  He will be admitted on the internal medicine service for management of his medical comorbidities.  He will probably have interventional radiology place a gastrostomy tube.  We will need to make some disposition arrangements so that he can be discharged from the hospital in time for his PET scan on April 22.  He would prefer not to stay with his sister in [place].  He is also not terribly enamored of the idea of going to a nursing home, even temporarily.    PMH: Past Medical History  Diagnosis Date  . Hypertension   . Cancer 03/16/15    left cervical lymph node  . Shortness of breath dyspnea   . GERD (gastroesophageal reflux disease)   . Seizures     Surg Hx: Past Surgical History  Procedure Laterality Date  . Knee surgery  1990  . Tonsillectomy and adenoidectomy    . Lymph node biopsy Left 03/16/15    left cervical, consistent with squamous cell carcinoma  . Hernia repair    . Tonsillectomy      FHx:   Family History  Problem Relation Age of Onset  . Cancer Mother   . Diabetes  Mother   . Diabetes Father   . Hypertension Father    SocHx:  reports that he has been smoking.  He has quit using smokeless tobacco. His smokeless tobacco use included Chew. He reports that he drinks about 18.0 oz of alcohol per week. He reports that he uses illicit drugs (Marijuana).  ALLERGIES:  Allergies  Allergen Reactions  . Aspirin     Makes me bleed     (Not in a hospital admission)  Results for orders placed or performed during the hospital encounter of 03/24/15 (from the past 48 hour(s))  Glucose, capillary     Status: None   Collection Time: 03/24/15  7:34 AM  Result Value Ref Range   Glucose-Capillary 85 70 - 99 mg/dL     BP:113/72,  HR: 90 b/min,  Height: 5 ft 10 in, Weight: 118 lb , BMI: 16.9 kg/m2  PHYSICAL EXAM: He is very thin and uncomfortable.  His voice is muffled.  He is breathing adequately but has some trouble managing thick secretions.  He has bulky nodes in both sides of his neck.   Lungs: Distant but clear to auscultation Heart: Regular rate and rhythm without murmur Abdomen: Soft, active, scaphoid Extremities: Thin but normal configuration Neurologic: Symmetric, grossly intact.  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  You are okay to take Advil this evening for your back.  you will be in the hospital several days. [name] 4/35/6861, 5:24 PM

## 2015-03-26 NOTE — Consult Note (Signed)
PULMONARY / CRITICAL CARE MEDICINE   Name: Kyle Alexander MRN: [id number] DOB: [birth year]    ADMISSION DATE:  03/26/2015 CONSULTATION DATE:  03/26/2015  REFERRING MD :  [name]  CHIEF COMPLAINT:  SCC of oropharynx, hypopharynx, and larynx s/p trach and multiple dental extractions  INITIAL PRESENTATION:  54 y.o. M who has just this month been diagnosed with SCC of the oropharynx, hypopharynx, and larynx, brought to [facility] 4/14 for tracheostomy, multiple dental extractions, and PEG placement.  He was liberated from ventilator in PACU and placed on ATC.  PCCM asked to admit to ICU overnight for observation.   STUDIES:  CT neck 4/3 >>> advanced hypopharyngeal / supraglottic mass with superficial spreading pattern.  Massive bilateral necrotic metastatic lymphadenopathy. CTA chest 4/3 >>> dilatation of ascending aorta 4cm.  No PE  SIGNIFICANT EVENTS: 1/02 - trach (Dr. [name]), dental extractions of tooth numbers 1, 2, 5, 6, 7, 8, 9, 10, 11, 12, 13, 18, 19, 21, 22, 23, 24, 25, 26, 27, 28, and 29 (Dr. Enrique Sack DDS).  Admitted to ICU overnight.   HISTORY OF PRESENT ILLNESS: Kyle Alexander is a 54 y.o. M with PMH as outlined below including T4N2 C SCC of the oropharynx, hypopharynx, and larynx (just diagnosed April 2016 after he was hospitalized for dysphagia, hoarseness, weight loss).  He was evaluated by Dr. [name] with ENT and plan was for tracheostomy followed by palliative radiation.  He was also seen by Dr. Enrique Sack DDS and plan was for full mouth extraction. In addition to these procedures which were performed PM of 4/14, he also is to have a PEG placed by IR.  He was liberated from the ventilator post op and tolerated ATC; however, was admitted to the ICU overnight for observation.  PCCM was consulted for assistance with medical management overnight.   PAST MEDICAL HISTORY :   has a past medical history of Hypertension; Cancer (03/16/15); Shortness of breath dyspnea; GERD (gastroesophageal  reflux disease); and Seizures.  has past surgical history that includes Knee surgery (1990); Tonsillectomy and adenoidectomy; Lymph node biopsy (Left, 03/16/15); Hernia repair; and Tonsillectomy. Prior to Admission medications   Medication Sig Start Date End Date Taking? Authorizing Provider  feeding supplement, RESOURCE BREEZE, (RESOURCE BREEZE) LIQD Take 1 Container by mouth 3 (three) times daily between meals. 03/17/15  Yes Juluis Mire, MD  ibuprofen (ADVIL,MOTRIN) 200 MG tablet Take 200 mg by mouth every 6 (six) hours as needed for mild pain.   Yes Historical Provider, MD  ergocalciferol (DRISDOL) 8000 UNIT/ML drops Take 6.3 mLs (50,000 Units total) by mouth once a week. Patient not taking: Reported on 03/20/2015 03/24/15   Juluis Mire, MD   Allergies  Allergen Reactions  . Aspirin     Makes me bleed    FAMILY HISTORY:  Family History  Problem Relation Age of Onset  . Cancer Mother   . Diabetes Mother   . Diabetes Father   . Hypertension Father     SOCIAL HISTORY:  reports that he has been smoking.  He has quit using smokeless tobacco. His smokeless tobacco use included Chew. He reports that he drinks about 18.0 oz of alcohol per week. He reports that he uses illicit drugs (Marijuana).  REVIEW OF SYSTEMS:   All negative; except for those that are bolded, which indicate positives.  Constitutional: weight loss, weight gain, night sweats, fevers, chills, fatigue, weakness.  HEENT: headaches, sore throat, sneezing, nasal congestion, post nasal drip, difficulty swallowing, tooth/dental problems, visual complaints, visual changes, ear  aches. Neuro: difficulty with speech, weakness, numbness, ataxia. CV:  chest pain, orthopnea, PND, swelling in lower extremities, dizziness, palpitations, syncope.  Resp: cough, hemoptysis, dyspnea, wheezing. GI  heartburn, indigestion, abdominal pain, nausea, vomiting, diarrhea, constipation, change in bowel habits, loss of appetite, hematemesis,  melena, hematochezia.  GU: dysuria, change in color of urine, urgency or frequency, flank pain, hematuria. MSK: joint pain or swelling, decreased range of motion, back pain. Psych: change in mood or affect, depression, anxiety, suicidal ideations, homicidal ideations. Skin: rash, itching, bruising.   SUBJECTIVE:  Nods head appropriately in response to questions, writing messages on white board.  Denies any chest pain, SOB, N/V, abd pain.  In good spirits post op.  VITAL SIGNS: Temp:  [97.3 F (36.3 C)-98.4 F (36.9 C)] 97.3 F (36.3 C) (04/14 1606) Pulse Rate:  [74-109] 82 (04/14 1745) Resp:  [14-36] 16 (04/14 1745) BP: (74-177)/(48-118) 165/81 mmHg (04/14 1745) SpO2:  [98 %-100 %] 100 % (04/14 1745) FiO2 (%):  [28 %] 28 % (04/14 1515) Weight:  [56.246 kg (124 lb)] 56.246 kg (124 lb) (04/14 0811) HEMODYNAMICS:   VENTILATOR SETTINGS: Vent Mode:  [-]  FiO2 (%):  [28 %] 28 % INTAKE / OUTPUT: Intake/Output      04/13 0701 - 04/14 0700 04/14 0701 - 04/15 0700   I.V. (mL/kg)  3200 (56.9)   Total Intake(mL/kg)  3200 (56.9)   Blood  120   Total Output   120   Net   +3080          PHYSICAL EXAMINATION: General: AA male, appears older than stated age, in NAD. Neuro: A&O x 3, non-focal.  HEENT: [place]/AT. PERRL, sclerae anicteric.  Oral dressings in place following dental extractions.  New trach in place, C/D/I. Cardiovascular: RRR, no M/R/G.  Lungs: Respirations even and unlabored.  CTA bilaterally, No W/R/R. Abdomen: BS x 4, soft, NT/ND.  Musculoskeletal: No gross deformities, no edema.  Skin: Intact, warm, no rashes.  LABS:  CBC  Recent Labs Lab 03/26/15 0758  WBC 16.3*  HGB 13.9  HCT 41.1  PLT 308   Coag's No results for input(s): APTT, INR in the last 168 hours. BMET  Recent Labs Lab 03/26/15 0758  NA 137  K 3.3*  CL 94*  CO2 24  BUN 9  CREATININE 0.79  GLUCOSE 60*   Electrolytes  Recent Labs Lab 03/26/15 0758  CALCIUM 10.2   Sepsis Markers No  results for input(s): LATICACIDVEN, PROCALCITON, O2SATVEN in the last 168 hours. ABG No results for input(s): PHART, PCO2ART, PO2ART in the last 168 hours. Liver Enzymes No results for input(s): AST, ALT, ALKPHOS, BILITOT, ALBUMIN in the last 168 hours. Cardiac Enzymes No results for input(s): TROPONINI, PROBNP in the last 168 hours. Glucose  Recent Labs Lab 03/24/15 0734 03/26/15 1437  GLUCAP 85 114*    Imaging No results found.   ASSESSMENT / PLAN:  HEMATOLOGIC A:   T4N2 C SCC of the oropharynx, hypopharynx, and larynx - s/p tracheostomy and multiple dental extractions 4/14 VTE Prophylaxis P:  Post op care per ENT, DDS. SCD's / Lovenox. CBC in AM.  PULMONARY Trach 4/14 >>> A: S/p trach due to T4N2 C SCC of the oropharynx, hypopharynx, and larynx Tobacco use disorder P:   Continue ATC as tolerated. Pulmonary hygiene. CXR in AM. Nicotine patch. Smoking cessation counseling.  CARDIOVASCULAR A:  Hx HTN - not on outpatient meds P:  Hydralazine PRN.  RENAL A:   Hypokalemia P:   LR @ 75. BMP  in AM.  GASTROINTESTINAL A:   GERD Nutrition P:   SUP: Pantoprazole. NPO. Nutrition consult for TF's. PEG to be placed by IR 4/15?  INFECTIOUS A:   Leukocytosis - likely acute phase reactant. P:   Monitor clinically.  ENDOCRINE A:   Hyperglycemia P:   SSI if glucose consistently > 180.  NEUROLOGIC A:   Pain Hx seizure disorder - not on meds ETOH use P:   Fentanyl PRN. CIWA protocol. Thiamine / Folate.   Anticipate transfer out of ICU in AM 4/15 at which time PCCM will sign off.  Family updated: Sister at bedside.  Interdisciplinary Family Meeting v Palliative Care Meeting:  Due by: 4/20.   Montey Hora, [facility] Pager: [phone]  or [phone] 03/26/2015, 6:10 PM  Attending:  I have seen and examined the patient with nurse practitioner/resident and agree with the note above.   He  has stage IV SCC of the supraglottis and underwent an elective tracheostomy and tooth extraction today as he is about to undergo radiation therapy to the lesion.  He is to have a PEG placed on 4/15 per patient, though I can't see an exact date in the IR consult note.  On my exam lungs are clear, trach site C/D/I, abdomen soft, nontender  Plan: Trach care PEG in AM O2 as needed for O2 saturation > 92% Monitor mouth for bleeding Monitor for EtOH withdrawal  Roselie Awkward, MD [facility] Pager: [phone] Cell: [phone] If no response, call [phone]

## 2015-03-26 NOTE — Anesthesia Preprocedure Evaluation (Signed)
Anesthesia Evaluation  Patient identified by MRN, date of birth, ID band Patient awake    History of Anesthesia Complications Negative for: history of anesthetic complications  Airway        Dental   Pulmonary shortness of breath, Current Smoker,          Cardiovascular hypertension, Pt. on medications     Neuro/Psych Seizures -, Poorly Controlled,     GI/Hepatic Neg liver ROS, GERD-  Poorly Controlled,  Endo/Other  negative endocrine ROS  Renal/GU negative Renal ROS     Musculoskeletal negative musculoskeletal ROS (+)   Abdominal   Peds negative pediatric ROS (+)  Hematology negative hematology ROS (+)   Anesthesia Other Findings   Reproductive/Obstetrics negative OB ROS                             Anesthesia Physical Anesthesia Plan Anesthesia Quick Evaluation

## 2015-03-26 NOTE — Progress Notes (Signed)
Pt. Requested that bed remain in the uo position,states that it makes it easier for him to breath.  Sider rails up and pt. Given call light.

## 2015-03-26 NOTE — Progress Notes (Signed)
IR team aware of request to eval pt for elective perc gastrostomy tube. Pharyngeal cancer s/p trach and dental extractions today. Pt currently in PACU. Anticipated plans for upcoming radiation therapy per chart notes.  IR team will eval in the coming days and discuss G-tube placement with pt and family.  Ascencion Dike PA-C Interventional Radiology 03/26/2015 3:30 PM

## 2015-03-27 ENCOUNTER — Ambulatory Visit (HOSPITAL_COMMUNITY): Payer: MEDICAID

## 2015-03-27 ENCOUNTER — Inpatient Hospital Stay (HOSPITAL_COMMUNITY): Payer: Medicaid Other

## 2015-03-27 ENCOUNTER — Encounter (HOSPITAL_COMMUNITY): Payer: Self-pay | Admitting: Radiology

## 2015-03-27 ENCOUNTER — Ambulatory Visit: Payer: Self-pay | Admitting: Internal Medicine

## 2015-03-27 DIAGNOSIS — I10 Essential (primary) hypertension: Secondary | ICD-10-CM

## 2015-03-27 DIAGNOSIS — Z720 Tobacco use: Secondary | ICD-10-CM

## 2015-03-27 DIAGNOSIS — C140 Malignant neoplasm of pharynx, unspecified: Secondary | ICD-10-CM

## 2015-03-27 DIAGNOSIS — R339 Retention of urine, unspecified: Secondary | ICD-10-CM

## 2015-03-27 DIAGNOSIS — K029 Dental caries, unspecified: Secondary | ICD-10-CM

## 2015-03-27 DIAGNOSIS — C760 Malignant neoplasm of head, face and neck: Secondary | ICD-10-CM

## 2015-03-27 DIAGNOSIS — E538 Deficiency of other specified B group vitamins: Secondary | ICD-10-CM

## 2015-03-27 LAB — CBC WITH DIFFERENTIAL/PLATELET
BASOS ABS: 0 10*3/uL (ref 0.0–0.1)
BASOS PCT: 0 % (ref 0–1)
EOS ABS: 0 10*3/uL (ref 0.0–0.7)
Eosinophils Relative: 0 % (ref 0–5)
HCT: 32 % — ABNORMAL LOW (ref 39.0–52.0)
HEMOGLOBIN: 10.7 g/dL — AB (ref 13.0–17.0)
Lymphocytes Relative: 9 % — ABNORMAL LOW (ref 12–46)
Lymphs Abs: 1.3 10*3/uL (ref 0.7–4.0)
MCH: 32.6 pg (ref 26.0–34.0)
MCHC: 33.4 g/dL (ref 30.0–36.0)
MCV: 97.6 fL (ref 78.0–100.0)
MONO ABS: 0.9 10*3/uL (ref 0.1–1.0)
MONOS PCT: 6 % (ref 3–12)
NEUTROS PCT: 84 % — AB (ref 43–77)
Neutro Abs: 11.9 10*3/uL — ABNORMAL HIGH (ref 1.7–7.7)
PLATELETS: 257 10*3/uL (ref 150–400)
RBC: 3.28 MIL/uL — ABNORMAL LOW (ref 4.22–5.81)
RDW: 15.2 % (ref 11.5–15.5)
WBC: 14.2 10*3/uL — ABNORMAL HIGH (ref 4.0–10.5)

## 2015-03-27 LAB — COMPREHENSIVE METABOLIC PANEL
ALBUMIN: 2.4 g/dL — AB (ref 3.5–5.2)
ALT: 9 U/L (ref 0–53)
AST: 17 U/L (ref 0–37)
Alkaline Phosphatase: 59 U/L (ref 39–117)
Anion gap: 13 (ref 5–15)
BUN: 7 mg/dL (ref 6–23)
CALCIUM: 9.6 mg/dL (ref 8.4–10.5)
CHLORIDE: 96 mmol/L (ref 96–112)
CO2: 27 mmol/L (ref 19–32)
Creatinine, Ser: 0.91 mg/dL (ref 0.50–1.35)
GFR calc Af Amer: >90 mL/min (ref >90)
GFR calc non Af Amer: >90 mL/min (ref >90)
Glucose, Bld: 83 mg/dL (ref 70–99)
Potassium: 4.6 mmol/L (ref 3.5–5.1)
SODIUM: 136 mmol/L (ref 135–145)
Total Bilirubin: 1.2 mg/dL (ref 0.3–1.2)
Total Protein: 6.4 g/dL (ref 6.0–8.3)

## 2015-03-27 LAB — MAGNESIUM: Magnesium: 1.3 mg/dL — ABNORMAL LOW (ref 1.5–2.5)

## 2015-03-27 LAB — PHOSPHORUS: Phosphorus: 3.1 mg/dL (ref 2.3–4.6)

## 2015-03-27 MED ORDER — MAGNESIUM SULFATE 2 GM/50ML IV SOLN
2.0000 g | Freq: Once | INTRAVENOUS | Status: AC
  Administered 2015-03-27: 2 g via INTRAVENOUS
  Filled 2015-03-27: qty 50

## 2015-03-27 MED ORDER — CHLORHEXIDINE GLUCONATE 0.12 % MT SOLN
15.0000 mL | Freq: Two times a day (BID) | OROMUCOSAL | Status: DC
Start: 2015-03-27 — End: 2015-04-07
  Administered 2015-03-27 – 2015-04-06 (×20): 15 mL via OROMUCOSAL
  Filled 2015-03-27 (×22): qty 15

## 2015-03-27 MED ORDER — TAMSULOSIN HCL 0.4 MG PO CAPS
0.4000 mg | ORAL_CAPSULE | Freq: Every day | ORAL | Status: DC
  Filled 2015-03-27 (×2): qty 1

## 2015-03-27 MED ORDER — SODIUM CHLORIDE 0.9 % IR SOLN
200.0000 mL | Status: DC
Start: 2015-03-27 — End: 2015-04-07

## 2015-03-27 MED ORDER — FOLIC ACID 5 MG/ML IJ SOLN
1.0000 mg | Freq: Every day | INTRAMUSCULAR | Status: DC
  Administered 2015-03-27 – 2015-04-07 (×12): 1 mg via INTRAVENOUS
  Filled 2015-03-27 (×13): qty 0.2

## 2015-03-27 NOTE — Progress Notes (Signed)
INITIAL NUTRITION ASSESSMENT  DOCUMENTATION CODES Per approved criteria  -Severe malnutrition in the context of chronic illness   Pt meets criteria for severe MALNUTRITION in the context of chronic illness as evidenced by severe fat and muscle depletion.  INTERVENTION: -Continue Resource Breeze po TID, each supplement provides 250 kcal and 9 grams of protein -RD to follow for diet advancement -When PEG is placed on Monday, 03/30/15, Initiate Osmolite 1.2 @ 20 ml/hr via PEG and increase by 10 ml every 12 hours to goal rate of 65 ml/hr.  -Tube feeding regimen provides 1872 kcal (100% of needs), 87 grams of protein, and 1279 ml of H2O.   NUTRITION DIAGNOSIS: Malnutrition related to decreased oral intake as evidenced by severe fat and muscle depletion.   Goal: Pt will meet >90% of estimated nutritional needs  Monitor:  TF initiation/tolerance/ advancement, PO/supplement intake, diet advancement, labs, weight changes, I/O's  Reason for Assessment: MST=2, Consult for TF Initiation and Management  54 y.o. male  Admitting Dx: <principal problem not specified>  ILIYA Alexander is a 54 y.o. male with squamous cell carcinoma of oropharynx, hypopharynx, and larynx S/p tracheostomy 4/14 and multiple dental extractions on 4/14. IR received request for percutaneous gastrostomy tube placement for nutrition during treatment. He denies any chest pain, shortness of breath or palpitations. He denies any recent fever or chills. He has previously tolerated sedation without complications and denies any previous abdominal surgeries.   ASSESSMENT: RD received consult for TF initiation and management. Spoke with RN, who reports no plans to initiate TF until after PEG is placed on Monday. RD will provide recommendations and place orders on 03/30/15.   S/p Procedure(s) 03/26/15: TRACHEOSTOMY POSSIBLE AWAKE TRACHEOSTOMY POSSIBLE DENTAL EXTRACTIONS (N/A) MULTIPLE EXTRACTION WITH ALVEOLOPLASTY (N/A)   Hx  obtained by both pt sister and pt at bedside. Both confirm poor appetite and wt loss PTA. Pt unable to speak, but can communicate via pen and paper and head nods.  He has been tolerating clear liquid diet well. Per RN, he was able to feed himself some chicken broth and juice earlier today. Pt sister reports that she hasn't noticed him coughing as much with liquids since trach has been placed. Pt reports his teeth hurt. Both are requesting more substantial foods; RD educated both pt and sister on properties of clear liquid diet and rationale for diet order. Will follow for diet tolerance and ability to tolerate different textures once diet is advanced, given hx of dysphagia and dental extractions.  Labs reviewed. Mg: 1.3. CBGS: 114.   Nutrition Focused Physical Exam:  Subcutaneous Fat:  Orbital Region: severe depletion Upper Arm Region: severe depletion Thoracic and Lumbar Region: severe depletion  Muscle:  Temple Region: severe depletion Clavicle Bone Region: severe depletion Clavicle and Acromion Bone Region: severe depletion Scapular Bone Region: severe depletion Dorsal Hand: severe depletion Patellar Region: severe depletion Anterior Thigh Region: severe depletion Posterior Calf Region: severe depletion  Edema: none present  Height: Ht Readings from Last 1 Encounters:  03/26/15 5' 10.5" (1.791 m)    Weight: Wt Readings from Last 1 Encounters:  03/27/15 124 lb 1.9 oz (56.3 kg)    Ideal Body Weight: 169#  % Ideal Body Weight: 73%  Wt Readings from Last 10 Encounters:  03/27/15 124 lb 1.9 oz (56.3 kg)  03/17/15 127 lb 14.4 oz (58.015 kg)    Usual Body Weight: 165#  % Usual Body Weight: 75%  BMI:  Body mass index is 17.55 kg/(m^2). Underweight  Estimated Nutritional Needs:  Kcal: 1800-2000 Protein: 85-95 grams Fluid: 1.8-2.0 L  Skin: closed neck incision  Diet Order: Diet clear liquid Room service appropriate?: Yes; Fluid consistency:: Thin  EDUCATION  NEEDS: -Education needs addressed   Intake/Output Summary (Last 24 hours) at 03/27/15 1630 Last data filed at 03/27/15 1435  Gross per 24 hour  Intake 2420.83 ml  Output      0 ml  Net 2420.83 ml    Last BM: PTA  Labs:   Recent Labs Lab 03/26/15 0758 03/27/15 0256  NA 137 136  K 3.3* 4.6  CL 94* 96  CO2 24 27  BUN 9 7  CREATININE 0.79 0.91  CALCIUM 10.2 9.6  MG  --  1.3*  PHOS  --  3.1  GLUCOSE 60* 83    CBG (last 3)   Recent Labs  03/26/15 1437  GLUCAP 114*    Scheduled Meds: . chlorhexidine  15 mL Mouth/Throat BID  . enoxaparin (LOVENOX) injection  40 mg Subcutaneous Q24H  . ergocalciferol  50,000 Units Oral Weekly  . feeding supplement (RESOURCE BREEZE)  1 Container Oral TID BM  . folic acid  1 mg Intravenous Daily  . multivitamin with minerals  1 tablet Oral Daily  . nicotine  21 mg Transdermal Daily  . pantoprazole (PROTONIX) IV  40 mg Intravenous Q24H  . tamsulosin  0.4 mg Oral Daily  . thiamine  100 mg Oral Daily   Or  . thiamine  100 mg Intravenous Daily    Continuous Infusions: . lactated ringers 75 mL/hr (03/27/15 0520)  . lactated ringers    . sodium chloride irrigation      Past Medical History  Diagnosis Date  . Hypertension   . Cancer 03/16/15    left cervical lymph node  . Shortness of breath dyspnea   . GERD (gastroesophageal reflux disease)   . Seizures     Past Surgical History  Procedure Laterality Date  . Knee surgery  1990  . Tonsillectomy and adenoidectomy    . Lymph node biopsy Left 03/16/15    left cervical, consistent with squamous cell carcinoma  . Hernia repair    . Tonsillectomy      Shequilla Goodgame A. Jimmye Norman, RD, LDN, CDE Pager: [phone] After hours Pager: [phone]

## 2015-03-27 NOTE — Progress Notes (Signed)
Transfer accept note  We are accepting patient transfer from [facility] for ongoing inpatient medical management and care coordination    Date: 03/27/2015               Patient Name:  Kyle Alexander MRN: [id number]  DOB: [birth year] Age / Sex: 53 y.o., male   PCP: Pcp Not In System         Medical Service: Internal Medicine Teaching Service         Attending Physician: Dr. Bertha Stakes, MD    First Contact: Jethro Poling, MS4 Pager: [phone]  Second Contact: Dr. Juluis Mire Pager: [phone]       After Hours (After 5p/  First Contact Pager: [phone]  weekends / holidays): Second Contact Pager: [phone]   Chief Complaint: Pharyngeal SCCa s/p tracheostomy and dental extractions  History of Present Illness: Patient is a 54 y.o. 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 PEG tube and dental procedures are being done in anticipation of palliative radiation with Dr. Lanell Persons. Prior to this admission, he was unable to lay flat 2/2 airway compromise and inability to swallow secretions.   Patient not speaking during exam s/p dental extraction with guaze in his mouth but is able to ask and answer questions using white board and motioning.   Meds: Current Facility-Administered Medications  Medication Dose Route Frequency Provider Last Rate Last Dose  . chlorhexidine (PERIDEX) 0.12 % solution 15 mL  15 mL Mouth/Throat BID Lenn Cal, DDS      . enoxaparin (LOVENOX) injection 40 mg  40 mg Subcutaneous Q24H Marjan Rabbani, MD   40 mg at 03/26/15 1753  . ergocalciferol (DRISDOL) 8000 UNIT/ML drops 50,000 Units  50,000 Units Oral Weekly Juluis Mire, MD   50,000 Units at 03/27/15 0912  . feeding supplement (RESOURCE BREEZE) (RESOURCE BREEZE) liquid 1 Container  1 Container  Oral TID BM Juluis Mire, MD   1 Container at 03/27/15 1000  . fentaNYL (SUBLIMAZE) injection 25-100 mcg  25-100 mcg Intravenous Q2H PRN Juanito Doom, MD   100 mcg at 03/27/15 4536  . folic acid injection 1 mg  1 mg Intravenous Daily Marjan Rabbani, MD   1 mg at 03/27/15 1000  . hydrALAZINE (APRESOLINE) injection 10-40 mg  10-40 mg Intravenous Q4H PRN Rahul P Desai, PA-C   20 mg at 03/27/15 0453  . lactated ringers infusion   Intravenous Continuous Rahul P Desai, PA-C 75 mL/hr at 03/27/15 0520 75 mL/hr at 03/27/15 0520  . lactated ringers infusion   Intravenous Continuous Lenn Cal, DDS   0  at 03/26/15 1600  . LORazepam (ATIVAN) tablet 1 mg  1 mg Oral Q6H PRN Juluis Mire, MD       Or  . LORazepam (ATIVAN) injection 1 mg  1 mg Intravenous Q6H PRN Juluis Mire, MD   1 mg at 03/27/15 0620  . multivitamin with minerals tablet 1 tablet  1 tablet Oral Daily Juluis Mire, MD   1 tablet at 03/26/15 1700  . nicotine (NICODERM CQ - dosed in mg/24 hours) patch 21 mg  21 mg Transdermal Daily Marjan Rabbani, MD   21 mg at 03/27/15 0908  . pantoprazole (PROTONIX) injection 40 mg  40 mg Intravenous Q24H Rahul P Desai, PA-C   40 mg at 03/26/15 2148  .  sodium chloride irrigation 0.9 % 200 mL  200 mL Irrigation Continuous Lenn Cal, DDS      . tamsulosin (FLOMAX) capsule 0.4 mg  0.4 mg Oral Daily Juanito Doom, MD   0.4 mg at 03/27/15 1000  . thiamine (VITAMIN B-1) tablet 100 mg  100 mg Oral Daily Marjan Rabbani, MD   100 mg at 03/26/15 1700   Or  . thiamine (B-1) injection 100 mg  100 mg Intravenous Daily Marjan Rabbani, MD   100 mg at 03/27/15 0907    Allergies: Allergies as of 03/20/2015 - Review Complete 03/20/2015  Allergen Reaction Noted  . Aspirin  07/21/2014   Past Medical History  Diagnosis Date  . Hypertension   . Cancer 03/16/15    left cervical lymph node  . Shortness of breath dyspnea   . GERD (gastroesophageal reflux disease)   . Seizures    Past Surgical  History  Procedure Laterality Date  . Knee surgery  1990  . Tonsillectomy and adenoidectomy    . Lymph node biopsy Left 03/16/15    left cervical, consistent with squamous cell carcinoma  . Hernia repair    . Tonsillectomy     Family History  Problem Relation Age of Onset  . Cancer Mother   . Diabetes Mother   . Diabetes Father   . Hypertension Father    History   Social History  . Marital Status: Single    Spouse Name: N/A  . Number of Children: N/A  . Years of Education: N/A   Occupational History  . Not on file.   Social History Main Topics  . Smoking status: Current Every Day Smoker -- 2.00 packs/day for 35 years  . Smokeless tobacco: Former Systems developer    Types: Chew  . Alcohol Use: 18.0 oz/week    30 Standard drinks or equivalent per week     Comment: occasional  . Drug Use: Yes    Special: Marijuana     Comment: occasional   . Sexual Activity: Not Currently   Other Topics Concern  . Not on file   Social History Narrative   Lives alone, unemployed. Previously worked in Architect.    Review of Systems: See HPI  Physical Exam: Blood pressure 131/84, pulse 82, temperature 98 F (36.7 C), temperature source Axillary, resp. rate 14, height 5' 10.5" (1.791 m), weight 56.3 kg (124 lb 1.9 oz), SpO2 100 %. General: resting in bed, in no acute distress HEENT: PERRL, EOMI, no scleral icterus, Intraoral and extraoral swelling, sutures from dental procedure Cardiac: RRR, no rubs, murmurs or gallops Pulm: clear to auscultation bilaterally, moving normal volumes of air Abd: soft, nontender, nondistended, BS present Ext: thin, warm and well perfused, no pedal edema Neuro: alert, cranial nerves II-XII grossly intact   Lab results: Basic Metabolic Panel:  Recent Labs  03/26/15 0758 03/27/15 0256  NA 137 136  K 3.3* 4.6  CL 94* 96  CO2 24 27  GLUCOSE 60* 83  BUN 9 7  CREATININE 0.79 0.91  CALCIUM 10.2 9.6  MG  --  1.3*  PHOS  --  3.1   Liver Function  Tests:  Recent Labs  03/27/15 0256  AST 17  ALT 9  ALKPHOS 59  BILITOT 1.2  PROT 6.4  ALBUMIN 2.4*   CBC:  Recent Labs  03/26/15 0758 03/27/15 0256  WBC 16.3* 14.2*  NEUTROABS  --  11.9*  HGB 13.9 10.7*  HCT 41.1 32.0*  MCV 98.1 97.6  PLT  308 257   CBG:  Recent Labs  03/26/15 1437  GLUCAP 114*   Drugs of Abuse     Component Value Date/Time   LABOPIA NONE DETECTED 03/15/2015 1533   COCAINSCRNUR NONE DETECTED 03/15/2015 1533   LABBENZ NONE DETECTED 03/15/2015 1533   AMPHETMU NONE DETECTED 03/15/2015 1533   THCU POSITIVE* 03/15/2015 1533   LABBARB NONE DETECTED 03/15/2015 1533    Imaging results:  Dg Chest Port 1 View  03/27/2015   CLINICAL DATA:  Respiratory failure.  EXAM: PORTABLE CHEST - 1 VIEW  COMPARISON:  03/15/2015  FINDINGS: Tracheostomy tube has been inserted. Heart size and pulmonary vascularity are normal. The lungs are clear. No osseous abnormality.  IMPRESSION: Tracheostomy tube in good position.  Clear lungs.   Electronically Signed   By: Lorriane Shire M.D.   On: 03/27/2015 07:49   Assessment & Plan by Problem: Active Problems:   HTN (hypertension)   Tobacco abuse   Alcohol abuse   Hypokalemia   Marijuana abuse   Folate deficiency   Protein-calorie malnutrition, severe   Vitamin D deficiency   Squamous cell carcinoma of supraglottis   Head and neck cancer  Head and neck cancer s/p tracheostomy: SCC of the oropharynx, hypopharynx, and larynx, now s/p tracheostomy for worsening dysphagia and inability to lay flat. Admitted to ICU overnight for observation with new tracheostomy. Now stable for step-down unit. Plan for PEG tube placement using push through approach on Monday if patient remains afebrile without signs of active infection. Current leukocytosis likely acute phase reactant in post-operative setting. Patient is scheduled for PET scan on 4/22 and must be outpatient in order to undergo this exam. -NPO after midnight on 4/17 in anticipation  of IR procedure - hold lovenox 24 hours prior to PEG tube.  - con't LR 75 cc/hr x 12 hours  Dental extraction: Underwent extraction of remaining teeth with alveoloplasty in the operating room with general anesthesia on 4/14. No sign of infection. Sutures intact with clots present. Intraoral and extraoral swelling noted on dental examination. Follow recs per dental medicine: - Chlorhexidine rinses twice daily in the morning and at bedtime. - Salt water rinses every 2 hours in between the chlorhexidine rinses. - Advance diet as tolerated and as per nutritional consult - Suture removal in 7-10 days. Sutures may dissolve on her own.  Oliguria: Patient reports difficulty voiding. Bladder scan demonstrates 565ml retained urine, 561ml obtained from in-and-out cath. - qShift bladder scan - in-and-out cath for retention >547ml - flomax 0.4mg  Qday  Tracheostomy: Now 1 day s/p new tracheostomy.  - Speech eval for swallowing study.  Nutrition: Patient has lost considerable amount of weight 2/2 poor intake in setting of exanding neck mass. Albumin 2.4. - nutrition c/s, appreciate recs - replete Mg (Mg low @ 1.3 on 4/15)  HTN: patient not taking medication at home. Some elevated BP since admission - consider amlodipine 5mg  - IV lopressor 2.5mg  for SBP>180  Tobacco use: - con't Nicotine patch    Alcohol abuse:  - CIWA protocol  Dispo: Disposition is deferred at this time, awaiting improvement of current medical problems. Patient does not want to go to SNF but lives at home alone. Sister is concerned about ability to care for himself alone. Anticipated discharge in approximately 3 day(s).   The patient does not have a current PCP (Pcp Not In System) and does need an [facility] hospital follow-up appointment after discharge.  The patient does not have transportation limitations that hinder transportation to clinic  appointments.  Signed: Carolan Shiver, Med Student 03/27/2015, 2:12 PM

## 2015-03-27 NOTE — Care Management Note (Addendum)
  Page 1 of 1   03/27/2015     3:06:06 PM CARE MANAGEMENT NOTE 03/27/2015  Patient:  Kyle Alexander, Kyle Alexander   Account Number:  [id number]  Date Initiated:  03/27/2015  Documentation initiated by:  Elissa Hefty  Subjective/Objective Assessment:   adm w plan for trach,peg,dental extractions     Action/Plan:   lives alone in [place], has supp sister   Anticipated DC Date:  03/31/2015   Anticipated DC Plan:  SKILLED NURSING FACILITY         Choice offered to / List presented to:             Status of service:   Medicare Important Message given?   (If response is "NO", the following Medicare IM given date fields will be blank) Date Medicare IM given:   Medicare IM given by:   Date Additional Medicare IM given:   Additional Medicare IM given by:    Discharge Disposition:    Per UR Regulation:  Reviewed for med. necessity/level of care/duration of stay  If discussed at [place] of Stay Meetings, dates discussed:    Comments:  03-27-15 Called Respiratory explained situation, they will notified trach team to begin  [place] teaching . Magdalen Spatz RN BSN      03-27-15 Spoke with patient and his sister at bedside. Patient lives alone in [place] at a hotel . Patient's sister and brother both live in [place] . Explained RT will have to teach patient and someone else trach care before discharge and bedside nurse will begin teaching tube feedings .  Patient and sister feel this is "too Much" for patient to handle alone at discharge . Patient does have friends in the area , however, they all work.   Family unable to stay in area due to work. Patient's sister asked if patient could go to SNF for short term rehab . Explained will ask SW . SW Eric aware . Magdalen Spatz RN BSN [phone]

## 2015-03-27 NOTE — Progress Notes (Signed)
TRANSFER ACCEPTING NOTE    Subjective:  Pt was admitted on 03/26/15 by ENT for tracheostomy placement and multiple tooth extractions for which he required ICU observation overnight. He was recently hospitalized from 4/3-5 for 52-month history of progressive dysphagia, voice hoarseness, weight loss, and enlarging neck masses in setting of chronic alcohol and tobacco abuse with lymph node biopsy results compatible with squamous cell carcinoma of oropharynx, hypopharynx, and larynx. He was to have outpatient g-tube placement and PET scan before starting of radiation therapy however could not tolerate lying flat and was consequently admitted for tracheostomy and g-tube placement.   Today he has dental packing in place and unable to verbally respond. He appears comfortable and has trach collar in place with normal oxygen saturation and is in no respiratory distress.    Objective: Vital signs in last 24 hours: Filed Vitals:   03/27/15 1200 03/27/15 1210 03/27/15 1254 03/27/15 1534  BP: 139/75  131/84   Pulse: 101 80 82 87  Temp:   98 F (36.7 C)   TempSrc:   Axillary   Resp: 17 10 14 20   Height:      Weight:      SpO2: 99% 100% 100% 100%   Weight change:   Intake/Output Summary (Last 24 hours) at 03/27/15 1724 Last data filed at 03/27/15 1435  Gross per 24 hour  Intake 2420.83 ml  Output      0 ml  Net 2420.83 ml   PHYSICAL EXAMINATION: General: NAD, cachectic appearing  HEENT:  Edentulous with dental packing and minimal oozing. Left sided pharyngeal deviation. Bilateral cervical lymphadenopathy.  Lungs: Trach collar in place. Clear to ausculation bilaterally. No wheezing, rhonchi, or rales.  Heart: Normal rate and rhythm  Abdomen: Soft, non-distended, non-tender. Normal BS. Extremities: No edema Neuro: A& O x 3.    Lab Results: Basic Metabolic Panel:  Recent Labs Lab 03/26/15 0758  03/27/15 0256  NA 137 136  K 3.3* 4.6  CL 94* 96  CO2 24 27  GLUCOSE 60* 83  BUN 9 7  CREATININE 0.79 0.91  CALCIUM 10.2 9.6  MG  --  1.3*  PHOS  --  3.1   Liver Function Tests:  Recent Labs Lab 03/27/15 0256  AST 17  ALT 9  ALKPHOS 59  BILITOT 1.2  PROT 6.4  ALBUMIN 2.4*   CBC:  Recent Labs Lab 03/26/15 0758 03/27/15 0256  WBC 16.3* 14.2*  NEUTROABS  --  11.9*  HGB 13.9 10.7*  HCT 41.1 32.0*  MCV 98.1 97.6  PLT 308 257   CBG:  Recent Labs Lab 03/24/15 0734 03/26/15 1437  GLUCAP 85 114*   Urine Drug Screen: Drugs of Abuse     Component Value Date/Time   LABOPIA NONE DETECTED 03/15/2015 1533   COCAINSCRNUR NONE DETECTED 03/15/2015 1533   LABBENZ NONE DETECTED 03/15/2015 1533   AMPHETMU NONE DETECTED 03/15/2015 1533   THCU POSITIVE* 03/15/2015 1533   LABBARB NONE DETECTED 03/15/2015 1533      Studies/Results: Dg Chest Port 1 View  03/27/2015  CLINICAL DATA:  Respiratory failure.  EXAM: PORTABLE CHEST - 1 VIEW  COMPARISON:  03/15/2015  FINDINGS: Tracheostomy tube has been inserted. Heart size and pulmonary vascularity are normal. The lungs are clear. No osseous abnormality.  IMPRESSION: Tracheostomy tube in good position.  Clear lungs.   Electronically Signed   By: Lorriane Shire M.D.   On: 03/27/2015 07:49   Medications: I have reviewed the patient's current medications. Scheduled Meds: . chlorhexidine  15 mL Mouth/Throat BID  . enoxaparin (LOVENOX) injection  40 mg Subcutaneous Q24H  . ergocalciferol  50,000 Units Oral Weekly  . feeding supplement (RESOURCE BREEZE)  1 Container Oral TID BM  . folic acid  1 mg Intravenous Daily  . multivitamin with minerals  1 tablet Oral Daily  . nicotine  21 mg Transdermal Daily  . pantoprazole (PROTONIX) IV  40 mg Intravenous Q24H  . tamsulosin  0.4 mg Oral Daily  . thiamine  100 mg Oral Daily   Or  . thiamine  100 mg Intravenous Daily   Continuous Infusions: . lactated ringers 75 mL/hr (03/27/15 0520)   . lactated ringers    . sodium chloride irrigation     PRN Meds:.fentaNYL (SUBLIMAZE) injection, hydrALAZINE, LORazepam **OR** LORazepam Assessment/Plan:  T4N2C Squamous Cell Carcinoma of Oropharynx, Hypopharynx, and Larynx s/p tracheostomy and multiple dental extractions on 4/14 -  Currently with 100% SpO2 on trach collar with well-controlled pain. Prealbumin 11 on 4/3.  -Appreciate ENT, dentistry, and IR recommendations -Plan for direct push approach g-tube placement on Monday, will need to be NPO after midnight and lovenox held -Tracheostomy and oral care per shift -PET scan scheduled for April 22 -Fentanyl 25-100 mcg Q2 hr PRN pain -Advance diet from clears as tolerated, continue LR 75 mL/hr until adequate PO intake -Chlorhexidine rinses BID and salt rinses Q 2 hr in between rinses  -Suture removal in 7-10 days (self-dissolvable) -Appreciate dietician consult, start resource breeze 1 can TID   -Appreciate SW consult, pt amenable to SNF, will obtain PT & OT consults  Urinary Retention - Pt with 520 cc urinary retention after surgery. Pt with no prior history of BPH.  -Bladder scan every shift  -In & Out catherization for retention >300 cc  -Continue flomax 0.4 mg daily  -Strict I & O's and daily weights  Hypomagnesemia - Mg 1.3 this AM in setting of chronic alcohol abuse.  -Administer IV Magnesium sulfate 2 g -Continue to monitor Mg level  Hypertension - Currently normotensive. Pt not on antihypertensives at home. -Continue flomax 0.4 mg daily -Consider adding amlodipine 5 mg daily if uncontrolled  Acute Blood Loss Anemia - Hg 10.7 from 13.9 on admission with no active bleeding or hemodynamic instability. Pt with no prior history of anemia with 100 mL EBL from recent dental extraction and IVF's. -Continue to monitor CBC -Monitor for bleeding and transfuse if <7  Tobacco Abuse - Pt with 1-2 pack history for approx past 30 years.  -Continue nicotine 21 mg patch daily  -Pt  counseled on tobacco cessation   Alcohol Abuse - Date of last drink unclear. History of remote alcohol-withdrawal seizures.   -Continue CIWA protocol  -Counsel on cessation  Vitamin D Deficiency  - Level < 4 on 03/15/15. -Continue liquid ergocalciferol 50 K U weekly (Week 2/8)  Folate Deficiency - Folate level low at 3.2 on 03/15/15.  -Continue IV folate 1 mg daily   Marijuana Abuse - Pt reports smoking THC 1-2 times weekly.  -Pt counseled on cessation  Thoracic Aortic Dilation - 4 cm ascending aortic dilation seen on CTA chest. -Needs follow-up CTA or MRA in 1 year  Prolonged QT - EKG on 03/15/15 with QTc of 470 in setting of hypomagnesemia and chronic alcohol abuse.  -Avoid prolonging QT medications  -Replete magnesium   Diet: Clears  DVT Ppx: Lovenox, SCD's Code: Full   Dispo: Disposition is deferred at this time, awaiting improvement of current medical problems.  Anticipated discharge in approximately 5-7 day(s).   The patient does have a current PCP (Pcp Not In System) and does need an [facility] hospital follow-up appointment after discharge.  The patient does have transportation limitations that hinder transportation to clinic appointments.  .Services Needed at time of discharge: Y = Yes, Blank = No PT:   OT:   RN:   Equipment:   Other:     LOS: 1 day   Juluis Mire, MD 03/27/2015, 5:24 PM

## 2015-03-27 NOTE — Progress Notes (Signed)
Implemented bladder scan for patient. Result read 590cc. Patient stated that he still does not want in and out cath; says that he would like to wait a little longer and see what happens.

## 2015-03-27 NOTE — Progress Notes (Signed)
After getting 520 on the bladder scan, notified Dr. Lake Bells pt still unable to void. Per MD will in and out cath pt.   After the in and out cath got 500cc out. Will continue to monitor.

## 2015-03-27 NOTE — Progress Notes (Signed)
IR PA aware of request for percutaneous gastrostomy tube placement, CT imaging reviewed with Dr. Earleen Newport today and the patient would be a candidate for a direct push through gastrostomy tube. He did receive Lovenox last evening and our guidelines require [place] to wait a full 24 hours without a dose so this will be performed possibly Monday if still an inpatient and if wbc wnl and afebrile or can be scheduled as an outpatient. Any questions please call IR MD [place] PA-C Interventional Radiology  03/27/15  8:29 AM

## 2015-03-27 NOTE — Progress Notes (Signed)
Report called to receiving Rn [id number], patient with no complaints at the current time. Will transfer with Rn in bed.

## 2015-03-27 NOTE — Progress Notes (Signed)
Patient arrived to floor transferred from [facility]. Report received from [place], [place].

## 2015-03-27 NOTE — Progress Notes (Signed)
Pt hasn't voided since admission. Encouraged pt to use a urinal but unable to void. Dr. Lake Bells notified and aware. Will try to get pt to use urinal later. Will continue to monitor.

## 2015-03-27 NOTE — Progress Notes (Signed)
POST OPERATIVE NOTE:  03/27/2015    Kyle Alexander [id number]  VITALS: BP 142/72 mmHg  Pulse 81  Temp(Src) 98 F (36.7 C) (Axillary)  Resp 12  Ht 5' 10.5" (1.791 m)  Wt 124 lb 1.9 oz (56.3 kg)  BMI 17.55 kg/m2  SpO2 100%  LABS:  Lab Results  Component Value Date   WBC 14.2* 03/27/2015   HGB 10.7* 03/27/2015   HCT 32.0* 03/27/2015   MCV 97.6 03/27/2015   PLT 257 03/27/2015   BMET    Component Value Date/Time   NA 136 03/27/2015 0256   K 4.6 03/27/2015 0256   CL 96 03/27/2015 0256   CO2 27 03/27/2015 0256   GLUCOSE 83 03/27/2015 0256   BUN 7 03/27/2015 0256   CREATININE 0.91 03/27/2015 0256   CALCIUM 9.6 03/27/2015 0256   GFRNONAA >90 03/27/2015 0256   GFRAA >90 03/27/2015 0256    Lab Results  Component Value Date   INR 1.08 03/15/2015   No results found for: PTT   Kyle Alexander is status post extraction of remaining teeth with alveoloplasty in the operating room with general anesthesia..  SUBJECTIVE: Patient with minimal oral complaints. Patient denies having any active bleeding. Patient is sitting in bed sipping on a nutritional drink.  EXAM: There is no sign of infection, heme, or ooze. Sutures are intact. Clots are present. Intraoral and extraoral swelling is noted.   ASSESSMENT: Post operative course is consistent with dental procedures performed in the operating room with general anesthesia. The patient is now edentulous.  PLAN: 1. Chlorhexidine rinses twice daily in the morning and at bedtime. 2. Salt water rinses every 2 hours in between the chlorhexidine rinses. 3. Advance diet as tolerated and as per nutritional consult 4. Suture removal in 7-10 days. Sutures may dissolve on her own.  Lenn Cal, DDS

## 2015-03-27 NOTE — H&P (Signed)
Referring Physician(s): CCM/Wolicki   History of Present Illness: Kyle Alexander is a 53 y.o. male with squamous cell carcinoma of oropharynx, hypopharynx, and larynx S/p tracheostomy 4/14 and multiple dental extractions on 4/14. IR received request for percutaneous gastrostomy tube placement for nutrition during treatment. He denies any chest pain, shortness of breath or palpitations. He denies any recent fever or chills. He has previously tolerated sedation without complications and denies any previous abdominal surgeries.   Past Medical History  Diagnosis Date  . Hypertension   . Cancer 03/16/15    left cervical lymph node  . Shortness of breath dyspnea   . GERD (gastroesophageal reflux disease)   . Seizures     Past Surgical History  Procedure Laterality Date  . Knee surgery  1990  . Tonsillectomy and adenoidectomy    . Lymph node biopsy Left 03/16/15    left cervical, consistent with squamous cell carcinoma  . Hernia repair    . Tonsillectomy      Allergies: Aspirin  Medications: Prior to Admission medications   Medication Sig Start Date End Date Taking? Authorizing Provider  feeding supplement, RESOURCE BREEZE, (RESOURCE BREEZE) LIQD Take 1 Container by mouth 3 (three) times daily between meals. 03/17/15  Yes Juluis Mire, MD  ibuprofen (ADVIL,MOTRIN) 200 MG tablet Take 200 mg by mouth every 6 (six) hours as needed for mild pain.   Yes Historical Provider, MD  ergocalciferol (DRISDOL) 8000 UNIT/ML drops Take 6.3 mLs (50,000 Units total) by mouth once a week. Patient not taking: Reported on 03/20/2015 03/24/15   Juluis Mire, MD     Family History  Problem Relation Age of Onset  . Cancer Mother   . Diabetes Mother   . Diabetes Father   . Hypertension Father     History   Social History  . Marital Status: Single    Spouse Name: N/A  . Number of Children: N/A  . Years of Education: N/A   Social History Main Topics  . Smoking status: Current Every Day  Smoker -- 2.00 packs/day for 35 years  . Smokeless tobacco: Former Systems developer    Types: Chew  . Alcohol Use: 18.0 oz/week    30 Standard drinks or equivalent per week     Comment: occasional  . Drug Use: Yes    Special: Marijuana     Comment: occasional   . Sexual Activity: Not Currently   Other Topics Concern  . None   Social History Narrative   Lives alone, unemployed. Previously worked in Architect.   Review of Systems: A 12 point ROS discussed and pertinent positives are indicated in the HPI above.  All other systems are negative.  Review of Systems  Vital Signs: BP 118/82 mmHg  Pulse 93  Temp(Src) 98 F (36.7 C) (Axillary)  Resp 14  Ht 5' 10.5" (1.791 m)  Wt 124 lb 1.9 oz (56.3 kg)  BMI 17.55 kg/m2  SpO2 100%  Physical Exam General: A&Ox3-uses whiteboard to communicate, NAD, trach intact, dental packing intact Heart: RRR without M/G/R Lungs: CTA B/L Abd: Soft, NT, ND, (+) BS  Mallampati Score:  MD Evaluation Airway: WNL Heart: WNL Abdomen: WNL Chest/ Lungs: WNL ASA  Classification: 3 Mallampati/Airway Score: Two  Imaging: Dg Chest 2 View  03/15/2015   CLINICAL DATA:  Chest pain, cough  EXAM: CHEST  2 VIEW  COMPARISON:  None.  FINDINGS: The heart size and mediastinal contours are within normal limits. Both lungs are clear. The visualized skeletal structures  are unremarkable.  IMPRESSION: No active cardiopulmonary disease.   Electronically Signed   By: Inez Catalina M.D.   On: 03/15/2015 08:14   Ct Soft Tissue Neck W Contrast  03/15/2015   CLINICAL DATA:  Weight loss. Neck mass. Swelling. Short of breath.  EXAM: CT NECK WITH CONTRAST  TECHNIQUE: Multidetector CT imaging of the neck was performed using the standard protocol following the bolus administration of intravenous contrast.  CONTRAST:  160mL OMNIPAQUE IOHEXOL 300 MG/ML  SOLN  COMPARISON:  None.  FINDINGS: I think there is an extensive circumferential malignancy in the hypo pharyngeal/supraglottic region. This  is difficult to measure precisely because of a superficial spreading pattern. The most bulky tumor is in the right supraglottic region.  There are multiple enlarged necrotic lymph nodes consistent with metastatic involvement. This includes retropharyngeal and parapharyngeal nodes, level 2 nodes bilaterally, level 3 and left-sided level for lymph nodes. The largest nodal mass is a a right level 2 conglomeration measuring 5.2 x 3.8 x 3.5 cm in diameter.  Parotid glands are normal. Submandibular glands are normal. Thyroid gland contains a 13 mm cyst or nodule in the left lobe. Carotid arteries and jugular veins are patent, though of both jugular veins show extrinsic compression by large nodal masses.  No significant bony finding.  IMPRESSION: I think there is an advanced hypopharyngeal/ supraglottic mass, largely with a superficial spreading pattern. Massive bilateral necrotic metastatic lymphadenopathy.  I did consider other possible etiologies such as tuberculosis, but think that is less likely.   Electronically Signed   By: Nelson Chimes M.D.   On: 03/15/2015 09:11   Ct Angio Chest Pe W/cm &/or Wo Cm  03/15/2015   CLINICAL DATA:  Weight loss with hemoptysis  EXAM: CT ANGIOGRAPHY CHEST WITH CONTRAST  TECHNIQUE: Multidetector CT imaging of the chest was performed using the standard protocol during bolus administration of intravenous contrast. Multiplanar CT image reconstructions and MIPs were obtained to evaluate the vascular anatomy.  CONTRAST:  157mL OMNIPAQUE IOHEXOL 300 MG/ML  SOLN  COMPARISON:  None.  FINDINGS: The lungs are well aerated bilaterally without focal infiltrate or sizable effusion. No parenchymal mass lesion is seen. The thoracic aorta and its branches are within normal limits. Prominence of the ascending aorta to 4 cm is noted. No dissection is identified. Normal tapering is noted distally. The pulmonary artery is within normal limits without evidence of filling defect to suggest pulmonary  embolism. No significant hilar or mediastinal adenopathy is noted.  The thoracic inlet shows evidence of lymphadenopathy similar to that seen on the recent CT of the neck. The dominant lymph node is noted adjacent to the left common carotid artery best seen on image number 31 of series 406. It measures 2.9 x 2.0 cm in dimension.  The visualized upper abdomen shows evidence of cholelithiasis. Area decreased attenuation is noted within the liver adjacent to the falciform ligament consistent with focal fatty infiltration. No acute bony abnormality is noted.  Review of the MIP images confirms the above findings.  IMPRESSION: No evidence of pulmonary embolism.  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 Recommend annual imaging followup by CTA or MRA. This recommendation follows 2010 ACCF/AHA/AATS/ACR/ASA/SCA/SCAI/SIR/STS/SVM Guidelines for the Diagnosis and Management of Patients with Thoracic Aortic Disease. Circulation. 2010; 121: F751-W258  Left supraclavicular adenopathy.  Cholelithiasis.   Electronically Signed   By: Inez Catalina M.D.   On: 03/15/2015 09:18   US Biopsy  03/16/2015   CLINICAL DATA:  54 year old with probable head and neck  cancer. Bilateral cervical lymphadenopathy. Tissue diagnosis is needed.  EXAM: ULTRASOUND-GUIDED BIOPSY OF LEFT CERVICAL LYMPH NODES  Physician: Stephan Minister. Anselm Pancoast, MD  FLUOROSCOPY TIME:  None  MEDICATIONS: None  ANESTHESIA/SEDATION: Moderate sedation time: None  PROCEDURE: The procedure was explained to the patient. The risks and benefits of the procedure were discussed and the patient's questions were addressed. Informed consent was obtained from the patient. Ultrasound demonstrated multiple large left cervical lymph nodes. Left side of the neck was prepped and draped in sterile fashion. Skin was anesthetized with 1% lidocaine. Using ultrasound guidance, 4 core biopsies were obtained from a large left cervical lymph node in the upper neck with an 18 gauge device. Specimens were placed  in formalin and saline. The core material appeared to be very necrotic and scant. As a result, 4 ultrasound-guided fine-needle aspirations were obtained from additional left cervical lymph nodes with 25 gauge needles. Bandages placed at the puncture sites.  FINDINGS: Multiple enlarged cervical lymph nodes on the left side. Dominant lesion in the left upper cervical lymph node was sampled with the core device. This dominant lymph node measures at least 2.6 cm. FNAs obtained from an adjacent left upper cervical lymph node and a left supraclavicular lymph node.  COMPLICATIONS: None  IMPRESSION: Ultrasound-guided core biopsies and fine-needle aspirations of left cervical lymph nodes.   Electronically Signed   By: Markus Daft M.D.   On: 03/16/2015 17:54   Dg Chest Port 1 View  03/27/2015   CLINICAL DATA:  Respiratory failure.  EXAM: PORTABLE CHEST - 1 VIEW  COMPARISON:  03/15/2015  FINDINGS: Tracheostomy tube has been inserted. Heart size and pulmonary vascularity are normal. The lungs are clear. No osseous abnormality.  IMPRESSION: Tracheostomy tube in good position.  Clear lungs.   Electronically Signed   By: Lorriane Shire M.D.   On: 03/27/2015 07:49    Labs:  CBC:  Recent Labs  03/16/15 0605 03/17/15 0817 03/26/15 0758 03/27/15 0256  WBC 10.3 11.1* 16.3* 14.2*  HGB 11.8* 12.4* 13.9 10.7*  HCT 34.4* 36.9* 41.1 32.0*  PLT 180 185 308 257    COAGS:  Recent Labs  03/15/15 1330  INR 1.08  APTT 34    BMP:  Recent Labs  03/16/15 0605 03/17/15 0817 03/26/15 0758 03/27/15 0256  NA 134* 135 137 136  K 3.4* 3.2* 3.3* 4.6  CL 97 99 94* 96  CO2 28 33* 24 27  GLUCOSE 127* 105* 60* 83  BUN <5* <5* 9 7  CALCIUM 9.1 9.2 10.2 9.6  CREATININE 0.60 0.58 0.79 0.91  GFRNONAA >90 >90 >90 >90  GFRAA >90 >90 >90 >90    LIVER FUNCTION TESTS:  Recent Labs  03/15/15 0740 03/27/15 0256  BILITOT 0.6 1.2  AST 26 17  ALT 15 9  ALKPHOS 76 59  PROT 8.5* 6.4  ALBUMIN 3.4* 2.4*     Assessment and Plan: Squamous cell carcinoma of oropharynx, hypopharynx, and larynx  S/p tracheostomy 4/14 S/p multiple dental extractions 4/14 Request for percutaneous gastrostomy tube placement for nutrition during treatment CT anatomy reviewed with Dr. Earleen Newport and amendable for direct push through approach  Risks and Benefits discussed with the patient and his sister today including, but not limited to the need for a barium enema during the procedure, bleeding, infection, peritonitis, or damage to adjacent structures. All of the patient's questions were answered, patient is agreeable to proceed. Consent signed and in chart. Will plan for Monday if continues to be afebrile without any signs  of active infection The patient will need to be NPO/hold tube feeds after midnight, blood thinners held, labs and vitals have been reviewed.   Thank you for this interesting consult.  I greatly enjoyed meeting Kyle Alexander and look forward to participating in their care.  SignedHedy Jacob 03/27/2015, 12:01 PM   I spent a total of 40 Minutes in face to face in clinical consultation, greater than 50% of which was counseling/coordinating care for [facility] of oropharynx, hypopharynx, and larynx

## 2015-03-27 NOTE — Progress Notes (Addendum)
PULMONARY / CRITICAL CARE MEDICINE   Name: Kyle Alexander MRN: [id number] DOB: 1961-04-09    ADMISSION DATE:  03/26/2015 CONSULTATION DATE:  03/27/2015  REFERRING MD :  [name]  CHIEF COMPLAINT:  SCC of oropharynx, hypopharynx, and larynx s/p trach and multiple dental extractions  INITIAL PRESENTATION:  54 y.o. M who has just this month been diagnosed with SCC of the oropharynx, hypopharynx, and larynx, brought to [facility] 4/14 for tracheostomy, multiple dental extractions, and PEG placement.  He was liberated from ventilator in PACU and placed on ATC.  PCCM asked to admit to ICU overnight for observation.   STUDIES:  CT neck 4/3 >>> advanced hypopharyngeal / supraglottic mass with superficial spreading pattern.  Massive bilateral necrotic metastatic lymphadenopathy. CTA chest 4/3 >>> dilatation of ascending aorta 4cm.  No PE  SIGNIFICANT EVENTS: 8/33 - trach (Dr. [name]), dental extractions of tooth numbers 1, 2, 5, 6, 7, 8, 9, 10, 11, 12, 13, 18, 19, 21, 22, 23, 24, 25, 26, 27, 28, and 29 (Dr. Enrique Sack DDS).  Admitted to ICU overnight.   SUBJECTIVE:  Has been awake, and alert, minimal pain, difficulty voiding  VITAL SIGNS: Temp:  [97.3 F (36.3 C)-98.4 F (36.9 C)] 98.2 F (36.8 C) (04/15 0400) Pulse Rate:  [68-109] 77 (04/15 0400) Resp:  [0-36] 13 (04/15 0400) BP: (74-177)/(48-118) 167/94 mmHg (04/15 0453) SpO2:  [98 %-100 %] 99 % (04/15 0400) FiO2 (%):  [28 %] 28 % (04/15 0319) Weight:  [56.246 kg (124 lb)] 56.246 kg (124 lb) (04/14 0811) HEMODYNAMICS:   VENTILATOR SETTINGS: Vent Mode:  [-]  FiO2 (%):  [28 %] 28 % INTAKE / OUTPUT: Intake/Output      04/14 0701 - 04/15 0700   I.V. (mL/kg) 3990.8 (71)   IV Piggyback 400   Total Intake(mL/kg) 4390.8 (78.1)   Blood 120   Total Output 120   Net +4270.8         PHYSICAL EXAMINATION: General: No acute distress HENT: trach site c/d/i, no oozing, OP with minimal oozing s/p tooth extraction Eyes: EOMi, sclera  anicteric PULM: CTA B CV: RRR, no mgr AB: BS+, soft, nontender Ext: warm, no edema Neuro: A&Ox4, writing to communicate on whiteboard, maew  LABS:  CBC  Recent Labs Lab 03/26/15 0758 03/27/15 0256  WBC 16.3* 14.2*  HGB 13.9 10.7*  HCT 41.1 32.0*  PLT 308 257   Coag's No results for input(s): APTT, INR in the last 168 hours. BMET  Recent Labs Lab 03/26/15 0758 03/27/15 0256  NA 137 136  K 3.3* 4.6  CL 94* 96  CO2 24 27  BUN 9 7  CREATININE 0.79 0.91  GLUCOSE 60* 83   Electrolytes  Recent Labs Lab 03/26/15 0758 03/27/15 0256  CALCIUM 10.2 9.6  MG  --  1.3*  PHOS  --  3.1   Sepsis Markers No results for input(s): LATICACIDVEN, PROCALCITON, O2SATVEN in the last 168 hours. ABG No results for input(s): PHART, PCO2ART, PO2ART in the last 168 hours. Liver Enzymes  Recent Labs Lab 03/27/15 0256  AST 17  ALT 9  ALKPHOS 59  BILITOT 1.2  ALBUMIN 2.4*   Cardiac Enzymes No results for input(s): TROPONINI, PROBNP in the last 168 hours. Glucose  Recent Labs Lab 03/24/15 0734 03/26/15 1437  GLUCAP 85 114*    Imaging No results found.   ASSESSMENT / PLAN:  HEMATOLOGIC A:   T4N2 C SCC of the oropharynx, hypopharynx, and larynx - s/p tracheostomy and multiple dental extractions 4/14 VTE  Prophylaxis Mild anemia post op, not actively bleeding P:  Post op care per ENT, DDS SCD's / Lovenox CBC in AM  PULMONARY Trach 4/14 >>> A: S/p trach due to T4N2 C SCC of the oropharynx, hypopharynx, and larynx Tobacco use disorder P:   Continue ATC as tolerated Pulmonary hygiene Nicotine patch Smoking cessation counseling  CARDIOVASCULAR A:  Hx HTN - not on outpatient meds P:  Hydralazine PRN When able to take PO add HCTZ  RENAL A:   Hypokalemia > r esolved Oliguria, difficult voiding, new problem 4/15 P:   LR @ 75 Continue to monitor BMET, UOP In and out cath, may need to start BPH therapy Start with Flomax when able to take  PO  GASTROINTESTINAL A:   GERD Nutrition P:   SUP: Pantoprazole NPO assuming PEG 4/15, otherwise advance diet Nutrition consult for TF's PEG to be placed by IR 4/15 > patient is prepared for this today  INFECTIOUS A:   Leukocytosis - likely acute phase reactant P:   Monitor clinically  ENDOCRINE A:   Hyperglycemia P:   SSI if glucose consistently > 180  NEUROLOGIC A:   Pain Hx seizure disorder - not on meds ETOH use P:   Fentanyl PRN CIWA protocol Thiamine / Folate  Transfer out of ICU to general medicine floor today  Family updated: Sister at bedside  Interdisciplinary Family Meeting v [place]:  Due by: 4/20.   Roselie Awkward, MD [place] [facility] Pager: [phone] Cell: [phone] If no response, call [phone]

## 2015-03-28 LAB — BASIC METABOLIC PANEL
ANION GAP: 15 (ref 5–15)
CO2: 27 mmol/L (ref 19–32)
CREATININE: 0.7 mg/dL (ref 0.50–1.35)
Calcium: 9.5 mg/dL (ref 8.4–10.5)
Chloride: 93 mmol/L — ABNORMAL LOW (ref 96–112)
GFR calc Af Amer: >90 mL/min (ref >90)
GFR calc non Af Amer: >90 mL/min (ref >90)
Glucose, Bld: 73 mg/dL (ref 70–99)
POTASSIUM: 3.6 mmol/L (ref 3.5–5.1)
Sodium: 135 mmol/L (ref 135–145)

## 2015-03-28 LAB — CBC
HCT: 33.3 % — ABNORMAL LOW (ref 39.0–52.0)
Hemoglobin: 11.1 g/dL — ABNORMAL LOW (ref 13.0–17.0)
MCH: 32.6 pg (ref 26.0–34.0)
MCHC: 33.3 g/dL (ref 30.0–36.0)
MCV: 97.7 fL (ref 78.0–100.0)
PLATELETS: 204 10*3/uL (ref 150–400)
RBC: 3.41 MIL/uL — ABNORMAL LOW (ref 4.22–5.81)
RDW: 15.1 % (ref 11.5–15.5)
WBC: 12.4 10*3/uL — ABNORMAL HIGH (ref 4.0–10.5)

## 2015-03-28 LAB — MAGNESIUM: Magnesium: 1.5 mg/dL (ref 1.5–2.5)

## 2015-03-28 MED ORDER — MAGNESIUM SULFATE 2 GM/50ML IV SOLN
2.0000 g | Freq: Once | INTRAVENOUS | Status: AC
  Administered 2015-03-28: 2 g via INTRAVENOUS
  Filled 2015-03-28: qty 50

## 2015-03-28 MED ORDER — ADULT MULTIVITAMIN LIQUID CH
5.0000 mL | Freq: Every day | ORAL | Status: DC
  Administered 2015-03-29 – 2015-04-07 (×7): 5 mL via ORAL
  Filled 2015-03-28 (×11): qty 5

## 2015-03-28 MED ORDER — FENTANYL CITRATE (PF) 100 MCG/2ML IJ SOLN: 25.0000 ug | INTRAMUSCULAR | Status: DC | PRN

## 2015-03-28 MED ORDER — MORPHINE SULFATE 2 MG/ML IJ SOLN
2.0000 mg | INTRAMUSCULAR | Status: DC | PRN
Start: 2015-03-28 — End: 2015-03-31
  Administered 2015-03-28 – 2015-03-31 (×14): 2 mg via INTRAVENOUS
  Filled 2015-03-28 (×14): qty 1

## 2015-03-28 NOTE — Evaluation (Signed)
Physical Therapy Evaluation Patient Details Name: Kyle Alexander MRN: [id number] DOB: 05/26/1961 Today's Date: 03/28/2015   History of Present Illness  Kyle Alexander is a 54 y.o. male with squamous cell carcinoma of oropharynx, hypopharynx, and larynx S/p tracheostomy 4/14 and multiple dental extractions on 4/14.  Clinical Impression  Patient is s/p above procedure, presenting with functional limitations due to the deficits listed below (see PT Problem List). Ambulates with min guard assist for safety and equipment management. SpO2 100% on 4L supplemental O2 30% FI, HR 90-106. Communicating via dry-erase board. Due to patient living alone in a hotel room with poor family support would recommend SNF for continued care until patient independent with all mobility. Will update recommendations based on patient's progress as he is scheduled for further operations. Patient will benefit from skilled PT to increase their independence and safety with mobility to allow discharge to the venue listed below.       Follow Up Recommendations SNF - If this is not an option, would maximize home health services including HHPT and RN.    Equipment Recommendations   (TBD - likely a single point cane)    Recommendations for Other Services OT consult;Speech consult     Precautions / Restrictions Precautions Precautions: None Restrictions Weight Bearing Restrictions: No      Mobility  Bed Mobility Overal bed mobility: Modified Independent                Transfers Overall transfer level: Needs assistance Equipment used: None Transfers: Sit to/from Stand Sit to Stand: Supervision         General transfer comment: supervision for safety with assist to manage lines/supplemental O2.   Ambulation/Gait Ambulation/Gait assistance: Min guard Ambulation Distance (Feet): 400 Feet Assistive device:  (Pushing 4 wheeled O2 carrier) Gait Pattern/deviations: Step-through pattern;Decreased step length -  left;Decreased stride length;Decreased weight shift to left;Shuffle;Antalgic;Drifts right/left;Trunk flexed Gait velocity: decreased   General Gait Details: Held 4 wheeled O2 tank carrier for stability. Tends to drage Lt foot and needs intermittent cues to correct. BIL knees flexed while ambulating. VC for upright posture. Mild sway noted and prefers to hold something for stability.  Stairs            Wheelchair Mobility    Modified Rankin (Stroke Patients Only)       Balance Overall balance assessment: Needs assistance Sitting-balance support: No upper extremity supported;Feet supported Sitting balance-Leahy Scale: Good     Standing balance support: No upper extremity supported Standing balance-Leahy Scale: Fair                               Pertinent Vitals/Pain Pain Assessment: 0-10 Pain Score: 8  Pain Location: back Pain Intervention(s): Monitored during session;Repositioned;Heat applied    Home Living Family/patient expects to be discharged to:: Skilled nursing facility Living Arrangements: Alone Available Help at Discharge:  (Has sister but caring for dying mother.) Type of Home: Other(Comment) [facility]) Home Access: Level entry     Home Layout: One level Home Equipment: None      Prior Function Level of Independence: Independent               Hand Dominance   Dominant Hand: Right    Extremity/Trunk Assessment   Upper Extremity Assessment: Defer to OT evaluation           Lower Extremity Assessment:  (hip flexion 4-/5 bil- grossly 4+/5 bil otherwise throughout )  Communication   Communication: Tracheostomy (using dry-erase board well)  Cognition Arousal/Alertness: Awake/alert Behavior During Therapy: WFL for tasks assessed/performed Overall Cognitive Status: Within Functional Limits for tasks assessed                      General Comments General comments (skin integrity, edema, etc.): SpO2 100% on 4L at  30% FI. HR in 90s - 106 HR at rest.    Exercises        Assessment/Plan    PT Assessment Patient needs continued PT services  PT Diagnosis Difficulty walking;Abnormality of gait;Acute pain   PT Problem List Decreased strength;Decreased activity tolerance;Decreased range of motion;Decreased balance;Decreased mobility;Decreased knowledge of use of DME;Cardiopulmonary status limiting activity;Pain  PT Treatment Interventions DME instruction;Gait training;Functional mobility training;Therapeutic activities;Therapeutic exercise;Balance training;Neuromuscular re-education;Patient/family education;Modalities   PT Goals (Current goals can be found in the Care Plan section) Acute Rehab PT Goals Patient Stated Goal: Go to a nursing facility PT Goal Formulation: With patient Time For Goal Achievement: 04/11/15 Potential to Achieve Goals: Good    Frequency Min 3X/week   Barriers to discharge Decreased caregiver support lives alone at a hotel    Co-evaluation               End of [place] During Treatment: Gait belt;Oxygen Activity Tolerance: Patient tolerated treatment well Patient left: in chair;with call bell/phone within reach;with family/visitor present;with chair alarm set Nurse Communication: Mobility status         Time: 5320-2334 PT Time Calculation (min) (ACUTE ONLY): 40 min   Charges:   PT Evaluation $Initial PT Evaluation Tier I: 1 Procedure PT Treatments $Gait Training: 8-22 mins $Therapeutic Activity: 8-22 mins   PT G CodesEllouise Newer 03/28/2015, 2:16 PM  Camille Bal [place], [place]

## 2015-03-28 NOTE — Progress Notes (Signed)
Subjective:  Pt seen and examined in AM. No acute events overnight. He is still unable to talk but writes that he is not able to tolerate liquids due to coughing requiring suctioning except for ginger ale and water. He continues to have pain and is requiring frequent fentanyl. He is making urine output.    Objective: Vital signs in last 24 hours: Filed Vitals:   03/27/15 2354 03/28/15 0500 03/28/15 0624 03/28/15 1004  BP:   144/90   Pulse: 86  105 93  Temp:   98.2 F (36.8 C)   TempSrc:   Oral   Resp: 16  18 16   Height:      Weight:  127 lb 13.9 oz (58 kg)    SpO2: 100%  100% 100%   Weight change: 3 lb 13.9 oz (1.754 kg)  Intake/Output Summary (Last 24 hours) at 03/28/15 1047 Last data filed at 03/28/15 0910  Gross per 24 hour  Intake   1740 ml  Output    800 ml  Net    940 ml   PHYSICAL EXAMINATION: General: NAD, cachectic appearing  HEENT:  Edentulous with dental packing. Bilateral cervical lymphadenopathy.  Lungs: Trach collar in place. Clear to ausculation bilaterally. No wheezing, rhonchi, or rales.  Heart: Normal rate and rhythm  Abdomen: Soft, non-distended, non-tender. Normal BS. Extremities: No edema Neuro: A& O x 3.    Lab Results: Basic Metabolic Panel:  Recent Labs Lab 03/27/15 0256 03/28/15 0450  NA 136 135  K 4.6 3.6  CL 96 93*  CO2 27 27  GLUCOSE 83 73  BUN 7 <5*  CREATININE 0.91 0.70  CALCIUM 9.6 9.5  MG 1.3* 1.5  PHOS 3.1  --    Liver Function Tests:  Recent Labs Lab 03/27/15 0256  AST 17  ALT 9  ALKPHOS 59  BILITOT 1.2  PROT 6.4  ALBUMIN 2.4*   CBC:  Recent Labs Lab 03/27/15 0256 03/28/15 0450  WBC 14.2* 12.4*  NEUTROABS 11.9*  --   HGB 10.7* 11.1*  HCT 32.0* 33.3*  MCV 97.6 97.7  PLT 257 204   CBG:  Recent Labs Lab 03/24/15 0734 03/26/15 1437  GLUCAP 85 114*   Urine Drug Screen: Drugs of Abuse     Component Value  Date/Time   LABOPIA NONE DETECTED 03/15/2015 1533   COCAINSCRNUR NONE DETECTED 03/15/2015 1533   LABBENZ NONE DETECTED 03/15/2015 1533   AMPHETMU NONE DETECTED 03/15/2015 1533   THCU POSITIVE* 03/15/2015 1533   LABBARB NONE DETECTED 03/15/2015 1533      Studies/Results: Dg Chest Port 1 View  03/27/2015   CLINICAL DATA:  Respiratory failure.  EXAM: PORTABLE CHEST - 1 VIEW  COMPARISON:  03/15/2015  FINDINGS: Tracheostomy tube has been inserted. Heart size and pulmonary vascularity are normal. The lungs are clear. No osseous abnormality.  IMPRESSION: Tracheostomy tube in good position.  Clear lungs.   Electronically Signed   By: Lorriane Shire M.D.   On: 03/27/2015 07:49   Medications: I have reviewed the patient's current medications. Scheduled Meds: .  chlorhexidine  15 mL Mouth/Throat BID  . enoxaparin (LOVENOX) injection  40 mg Subcutaneous Q24H  . ergocalciferol  50,000 Units Oral Weekly  . feeding supplement (RESOURCE BREEZE)  1 Container Oral TID BM  . folic acid  1 mg Intravenous Daily  . multivitamin  5 mL Oral Daily  . nicotine  21 mg Transdermal Daily  . tamsulosin  0.4 mg Oral Daily  . thiamine  100 mg Oral Daily   Or  . thiamine  100 mg Intravenous Daily   Continuous Infusions: . lactated ringers 1 application (47/82/95 6213)  . lactated ringers    . sodium chloride irrigation     PRN Meds:.fentaNYL (SUBLIMAZE) injection, LORazepam **OR** LORazepam Assessment/Plan:  T4N2C Squamous Cell Carcinoma of Oropharynx, Hypopharynx, and Larynx s/p tracheostomy and multiple dental extractions on 4/14 -  Currently with 100% SpO2 on trach collar with 5L O2 flow. Pt with moderately controlled pain. Prealbumin 11 on 4/3.  -Appreciate ENT, dentistry, and IR recommendations -Plan for direct push approach g-tube placement on Monday, will need to be NPO after midnight and lovenox held -Tracheostomy and oral care per shift -PET scan scheduled for April 22 -Change fentanyl 25-100 mcg Q2  hr to Q 4hr PRN pain and add IV morphine 2 mg Q 2 hr -Clear diet, will advance as tolerated, continue LR 75 mL/hr until adequate PO intake -Chlorhexidine rinses BID and salt rinses Q 2 hr in between rinses  -Suture removal in 7-10 days (self-dissolvable) -Appreciate dietician consult, start resource breeze 1 can TID   -Appreciate SW consult, pt amenable to SNF, will obtain PT & OT consults  Urinary Retention - 300 cc urine output documeneted yesterday.  Pt with no prior history of BPH.  -Bladder scan every shift -In & Out catherization for retention >300 cc  -Discontinue flomax 0.4 mg daily as pt cannot tolerate PO intake at this time  -Strict I & O's and daily weights  Hypomagnesemia - Mg 1.5 this AM in setting of chronic alcohol abuse.  -Administer IV magnesium sulfate 2 g -Continue to monitor Mg level  Hypertension - Currently normotensive. Pt not on antihypertensives at home. -Discontinue flomax 0.4 mg daily as pt cannot tolerate PO intake at this time  -Consider adding amlodipine 5 mg daily if uncontrolled  Acute Blood Loss Anemia - Hg 11.1 from 13.9 on admission with no active bleeding or hemodynamic instability. Pt with no prior history of anemia with 100 mL EBL from recent dental extraction. -Continue to monitor CBC -Monitor for bleeding and transfuse if <7  Tobacco Abuse - Pt with 1-2 pack history for approx past 30 years.  -Continue nicotine 21 mg patch daily  -Pt counseled on tobacco cessation   Alcohol Abuse - Date of last drink unclear. History of remote alcohol-withdrawal seizures.   -Continue CIWA protocol  -Counsel on cessation  Vitamin D Deficiency  - Level < 4 on 03/15/15. -Continue liquid ergocalciferol 50 K U weekly (Week 2/8)  Folate Deficiency - Folate level low at 3.2 on 03/15/15.  -Continue IV folate 1 mg daily   Marijuana Abuse - Pt reports smoking THC 1-2 times weekly.  -Pt counseled on cessation   Thoracic Aortic Dilation - 4 cm ascending  aortic dilation seen on CTA chest. -Needs follow-up CTA or MRA in 1 year  Prolonged QT - EKG on 03/15/15 with QTc of 470 in setting of hypomagnesemia and chronic alcohol abuse.  -Avoid prolonging QT medications  -Replete magnesium   Diet: Clears  DVT Ppx:  Lovenox, SCD's Code: Full   Dispo: Disposition is deferred at this time, awaiting improvement of current medical problems.  Anticipated discharge in approximately 5-7 day(s).   The patient does have a current PCP (Pcp Not In System) and does need an [facility] hospital follow-up appointment after discharge.  The patient does have transportation limitations that hinder transportation to clinic appointments.  .Services Needed at time of discharge: Y = Yes, Blank = No PT:   OT:   RN:   Equipment:   Other:     LOS: 2 days   Juluis Mire, MD 03/28/2015, 10:47 AM

## 2015-03-29 LAB — CBC
HCT: 30.1 % — ABNORMAL LOW (ref 39.0–52.0)
Hemoglobin: 10.2 g/dL — ABNORMAL LOW (ref 13.0–17.0)
MCH: 32.5 pg (ref 26.0–34.0)
MCHC: 33.9 g/dL (ref 30.0–36.0)
MCV: 95.9 fL (ref 78.0–100.0)
Platelets: 176 10*3/uL (ref 150–400)
RBC: 3.14 MIL/uL — AB (ref 4.22–5.81)
RDW: 14.7 % (ref 11.5–15.5)
WBC: 10.4 10*3/uL (ref 4.0–10.5)

## 2015-03-29 LAB — MAGNESIUM: Magnesium: 1.4 mg/dL — ABNORMAL LOW (ref 1.5–2.5)

## 2015-03-29 MED ORDER — SODIUM CHLORIDE (HYPERTONIC) 2 % OP SOLN
1.0000 [drp] | OPHTHALMIC | Status: DC | PRN
  Filled 2015-03-29: qty 15

## 2015-03-29 MED ORDER — CEFAZOLIN SODIUM-DEXTROSE 2-3 GM-% IV SOLR
2.0000 g | INTRAVENOUS | Status: AC
  Administered 2015-03-30: 2 g via INTRAVENOUS
  Filled 2015-03-29: qty 50

## 2015-03-29 MED ORDER — HYDROMORPHONE HCL 1 MG/ML IJ SOLN
1.0000 mg | INTRAMUSCULAR | Status: DC | PRN
  Administered 2015-03-30 – 2015-04-06 (×24): 1 mg via INTRAVENOUS
  Filled 2015-03-29 (×24): qty 1

## 2015-03-29 MED ORDER — MAGNESIUM SULFATE 2 GM/50ML IV SOLN
2.0000 g | Freq: Once | INTRAVENOUS | Status: AC
  Administered 2015-03-29: 2 g via INTRAVENOUS
  Filled 2015-03-29: qty 50

## 2015-03-29 MED ORDER — LORAZEPAM 2 MG/ML IJ SOLN
1.0000 mg | Freq: Four times a day (QID) | INTRAMUSCULAR | Status: AC | PRN
  Administered 2015-03-30 – 2015-04-01 (×5): 1 mg via INTRAVENOUS
  Filled 2015-03-29 (×5): qty 1

## 2015-03-29 MED ORDER — ENOXAPARIN SODIUM 40 MG/0.4ML ~~LOC~~ SOLN: 40.0000 mg | SUBCUTANEOUS | Status: DC

## 2015-03-29 MED ORDER — LORAZEPAM 1 MG PO TABS: 1.0000 mg | ORAL_TABLET | Freq: Four times a day (QID) | ORAL | Status: AC | PRN

## 2015-03-29 NOTE — Evaluation (Signed)
Occupational Therapy Evaluation Patient Details Name: Kyle Alexander MRN: [id number] DOB: 14-Aug-1961 Today's Date: 03/29/2015    History of Present Illness Kyle Alexander is a 54 y.o. male with squamous cell carcinoma of oropharynx, hypopharynx, and larynx S/p tracheostomy 4/14 and multiple dental extractions on 4/14.   Clinical Impression   PTA pt lived at home and was independent with ADLs. Pt is currently limited by generalized weakness and cardiopulmonary status. Pt has a trach collar and SPO2 decreased during marching in place, but rebounded quickly to 100%. Pt is planned for PEG Monday 4/18. Pt will benefit from acute OT to promote strengthening and independence with ADLs. Recommend SNF at d/c due to lack of caregiver support.     Follow Up Recommendations  SNF;Supervision/Assistance - 24 hour    Equipment Recommendations  Other (comment) (defer to SNF)    Recommendations for Other Services       Precautions / Restrictions Precautions Precautions: None Restrictions Weight Bearing Restrictions: No      Mobility Bed Mobility Overal bed mobility: Modified Independent                Transfers Overall transfer level: Needs assistance Equipment used: None Transfers: Sit to/from Stand Sit to Stand: Supervision         General transfer comment: supervision for safety with assist to manage lines/supplemental O2.          ADL Overall ADL's : Needs assistance/impaired Eating/Feeding: Independent;Sitting   Grooming: Set up;Sitting   Upper Body Bathing: Minimal assitance;Sitting Upper Body Bathing Details (indicate cue type and reason): A to manage trach collar  Lower Body Bathing: Min guard;Sit to/from stand   Upper Body Dressing : Minimal assistance;Sitting Upper Body Dressing Details (indicate cue type and reason): Assist to manage trach collar Lower Body Dressing: Min guard;Sit to/from stand Lower Body Dressing Details (indicate cue type and reason):  Pt able to reach feet to don slippers to stand Toilet Transfer: Minimal assistance;Stand-pivot (1 person hand held assist to balance)             General ADL Comments: Pt O2 sats dropping to 70% when marching in place, but quickly rebound to 100% when seated. Pt was motivated to perform twice and reported it felt better to stand up and stretch his legs. SpO2 100% on 4L at 30% FI.      Vision Additional Comments: No apparent deficits          Pertinent Vitals/Pain Pain Assessment: 0-10 Pain Score: 5  Pain Location: back Pain Intervention(s): Monitored during session;Repositioned     Hand Dominance Right   Extremity/Trunk Assessment Upper Extremity Assessment Upper Extremity Assessment: Generalized weakness   Lower Extremity Assessment Lower Extremity Assessment: Defer to PT evaluation   Cervical / Trunk Assessment Cervical / Trunk Assessment: Normal;Other exceptions (trach limits neck movements)   Communication Communication Communication: Tracheostomy (dry erase board to communicate)   Cognition Arousal/Alertness: Awake/alert Behavior During Therapy: WFL for tasks assessed/performed Overall Cognitive Status: Within Functional Limits for tasks assessed                        Exercises Exercises: Other exercises Other Exercises Other Exercises: seated and standing marches. UE forward flexion        Home Living Family/patient expects to be discharged to:: Skilled nursing facility Living Arrangements: Alone  Prior Functioning/Environment Level of Independence: Independent             OT Diagnosis: Generalized weakness   OT Problem List: Decreased strength;Decreased range of motion;Decreased activity tolerance;Impaired balance (sitting and/or standing);Decreased knowledge of use of DME or AE;Decreased knowledge of precautions;Cardiopulmonary status limiting activity   OT Treatment/Interventions:  Self-care/ADL training;Therapeutic exercise;Energy conservation;DME and/or AE instruction;Therapeutic activities;Patient/family education;Balance training    OT Goals(Current goals can be found in the care plan section) Acute Rehab OT Goals Patient Stated Goal: Be myself OT Goal Formulation: With patient Time For Goal Achievement: 04/12/15 Potential to Achieve Goals: Good ADL Goals Pt Will Perform Upper Body Bathing: with supervision;sitting Pt Will Perform Upper Body Dressing: with supervision;sitting Pt Will Transfer to Toilet: with supervision;ambulating Pt/caregiver will Perform Home Exercise Program: Increased strength;Both right and left upper extremity;With Supervision Additional ADL Goal #1: Pt will manage trach collar and supplemental O2 during functional mobility and ADLs with Supervision.   OT Frequency: Min 2X/week   Barriers to D/C: Decreased caregiver support             End of Session Equipment Utilized During Treatment: Other (comment);Oxygen (trach collar)  Activity Tolerance: Patient tolerated treatment well Patient left: in bed;with call bell/phone within reach;with family/visitor present   Time: 1443-1540 OT Time Calculation (min): 23 min Charges:  OT General Charges $OT Visit: 1 Procedure OT Evaluation $Initial OT Evaluation Tier I: 1 Procedure OT Treatments $Therapeutic Activity: 8-22 mins G-Codes:    Juluis Rainier 04/05/15, 6:39 PM  Secundino Ginger Lynetta Mare, OTR/L Occupational Therapist [phone] (pager)

## 2015-03-29 NOTE — Progress Notes (Signed)
Subjective:  Pt seen and examined in AM. No acute events overnight. He is still unable to talk but writes that he has jaw pain and at the back of his head. He continues to have difficulty with swallowing liquids due to coughing requiring suctioning. He is making good urine output.    Objective: Vital signs in last 24 hours: Filed Vitals:   03/29/15 0505 03/29/15 0842 03/29/15 0901 03/29/15 1309  BP: 109/75     Pulse: 93 86  91  Temp: 98.8 F (37.1 C)     TempSrc: Oral     Resp: 18 17  16   Height:      Weight: 134 lb 11.2 oz (61.1 kg)     SpO2: 99% 100% 100% 100%   Weight change: 6 lb 13.4 oz (3.1 kg)  Intake/Output Summary (Last 24 hours) at 03/29/15 1330 Last data filed at 03/29/15 0900  Gross per 24 hour  Intake   2880 ml  Output   1150 ml  Net   1730 ml   PHYSICAL EXAMINATION: General: NAD, cachectic appearing  HEENT:  Edentulous with dental packing. Bilateral cervical lymphadenopathy.  Lungs: Trach collar in place. Clear to ausculation bilaterally. No wheezing, rhonchi, or rales.  Heart: Normal rate and rhythm  Abdomen: Soft, non-distended, non-tender. Normal BS. Extremities: No edema Neuro: A& O x 3.    Lab Results: Basic Metabolic Panel:  Recent Labs Lab 03/27/15 0256 03/28/15 0450 03/29/15 0410  NA 136 135  --   K 4.6 3.6  --   CL 96 93*  --   CO2 27 27  --   GLUCOSE 83 73  --   BUN 7 <5*  --   CREATININE 0.91 0.70  --   CALCIUM 9.6 9.5  --   MG 1.3* 1.5 1.4*  PHOS 3.1  --   --    Liver Function Tests:  Recent Labs Lab 03/27/15 0256  AST 17  ALT 9  ALKPHOS 59  BILITOT 1.2  PROT 6.4  ALBUMIN 2.4*   CBC:  Recent Labs Lab 03/27/15 0256 03/28/15 0450 03/29/15 0410  WBC 14.2* 12.4* 10.4  NEUTROABS 11.9*  --   --   HGB 10.7* 11.1* 10.2*  HCT 32.0* 33.3* 30.1*  MCV 97.6 97.7 95.9  PLT 257 204 176   CBG:  Recent Labs Lab 03/24/15 0734  03/26/15 1437  GLUCAP 85 114*   Urine Drug Screen: Drugs of Abuse     Component Value Date/Time   LABOPIA NONE DETECTED 03/15/2015 1533   COCAINSCRNUR NONE DETECTED 03/15/2015 1533   LABBENZ NONE DETECTED 03/15/2015 1533   AMPHETMU NONE DETECTED 03/15/2015 1533   THCU POSITIVE* 03/15/2015 1533   LABBARB NONE DETECTED 03/15/2015 1533      Studies/Results: No results found. Medications: I have reviewed the patient's current medications. Scheduled Meds: . [START ON 03/30/2015]  ceFAZolin (ANCEF) IV  2 g Intravenous On Call  . chlorhexidine  15 mL Mouth/Throat BID  . [START ON 03/30/2015] enoxaparin (LOVENOX) injection  40 mg Subcutaneous Q24H  .  ergocalciferol  50,000 Units Oral Weekly  . feeding supplement (RESOURCE BREEZE)  1 Container Oral TID BM  . folic acid  1 mg Intravenous Daily  . multivitamin  5 mL Oral Daily  . nicotine  21 mg Transdermal Daily  . thiamine  100 mg Oral Daily   Or  . thiamine  100 mg Intravenous Daily   Continuous Infusions: . lactated ringers 1 application (16/10/96 0454)  . lactated ringers Stopped (03/28/15 1914)  . sodium chloride irrigation     PRN Meds:.fentaNYL (SUBLIMAZE) injection, morphine injection, sodium chloride Assessment/Plan:  T4N2C Squamous Cell Carcinoma of Oropharynx, Hypopharynx, and Larynx s/p tracheostomy and multiple dental extractions on 4/14 -  Currently with 100% SpO2 on trach collar with 5L O2 flow. Pt with moderately controlled pain. Prealbumin 11 on 4/3.  -Appreciate ENT, dentistry, and IR recommendations -Plan for direct push approach g-tube placement on Monday, NPO after midnight and lovenox to be held -Tracheostomy and oral care per shift -PET scan scheduled for April 22 -Discontinue fentanyl 25-100 mcg Q4hr PRN pain  -Continue IV morphine 2 mg Q 2 hr and add IV dilaudid 1 mg Q 4 hr PRN -Clear diet, will advance as tolerated, continue LR 75 mL/hr until adequate PO intake -Chlorhexidine rinses BID and salt rinses Q  2 hr in between rinses  -Suture removal in 4-7 days (self-dissolvable) -Appreciate dietician consult, continue resource breeze 1 can TID   -Appreciate SW consult, pt amenable to SNF, will obtain PT & OT consults -Obtain SLP evaluation    Urinary Retention - Resolved. 1650cc urine output yesterday. Pt with no prior history of BPH.  -In & Out catherization for retention >300 cc  -Strict I & O's and daily weights  Hypomagnesemia - Mg 1.4 this AM in setting of chronic alcohol abuse.  -Administer IV magnesium sulfate 2 g -Continue to monitor Mg level  Hypertension - Currently normotensive. Pt not on antihypertensives at home. -Consider adding amlodipine 5 mg daily if uncontrolled  Acute Blood Loss Anemia - Hg 10.2  from 13.9 on admission with no active bleeding or hemodynamic instability. Pt with no prior history of anemia with 100 mL EBL from recent dental extraction. -Continue to monitor CBC -Monitor for bleeding and transfuse if <7  Tobacco Abuse - Pt with 1-2 pack history for approx past 30 years.  -Continue nicotine 21 mg patch daily  -Pt counseled on tobacco cessation   Alcohol Abuse - Date of last drink unclear. History of remote alcohol-withdrawal seizures.   -Continue CIWA protocol  -Counsel on cessation  Vitamin D Deficiency  - Level < 4 on 03/15/15. -Continue liquid ergocalciferol 50 K U weekly (Week 2/8)  Folate Deficiency - Folate level low at 3.2 on 03/15/15.  -Continue IV folate 1 mg daily   Marijuana Abuse - Pt reports smoking THC 1-2 times weekly.  -Pt counseled on cessation   Thoracic Aortic Dilation - 4 cm ascending aortic dilation seen on CTA chest. -Needs follow-up CTA or MRA in 1 year  Prolonged QT - EKG on 03/15/15 with QTc of 470 in setting of hypomagnesemia and chronic alcohol abuse.  -Avoid prolonging QT medications  -Replete magnesium   Diet: Clears  DVT Ppx: Lovenox, SCD's Code: Full   Dispo: Disposition is deferred at this time, awaiting  improvement of current medical problems.  Anticipated discharge in approximately 3-5 day(s).   The patient does have a current PCP (Pcp Not In System) and does need an [facility] hospital follow-up appointment after discharge.  The patient does have transportation limitations that hinder transportation to clinic appointments.  .Services Needed at time of discharge: Y = Yes, Blank = No PT:   OT:   RN:   Equipment:   Other:     LOS: 3 days   Juluis Mire, MD 03/29/2015, 1:30 PM

## 2015-03-29 NOTE — Progress Notes (Addendum)
Report of patient passed on to [facility] RN to resume care for the rest of this shift  Explained to pt that this RN was to transfer his care to another nurse, pt and family did not want staff to do so. This Probation officer continues care for this patient.

## 2015-03-29 NOTE — Progress Notes (Signed)
IR follow up. Pt afebrile, WBC down to 10k Ready to proceed tomorrow. Have placed orders for NPO p MN and to hold Lovenox today  Ascencion Dike PA-C Interventional Radiology 03/29/2015 7:42 AM

## 2015-03-30 ENCOUNTER — Ambulatory Visit: Payer: Self-pay

## 2015-03-30 ENCOUNTER — Ambulatory Visit: Payer: Medicaid Other | Admitting: Radiation Oncology

## 2015-03-30 ENCOUNTER — Inpatient Hospital Stay (HOSPITAL_COMMUNITY): Payer: Medicaid Other

## 2015-03-30 ENCOUNTER — Ambulatory Visit: Admit: 2015-03-30 | Payer: Medicaid Other | Admitting: Radiation Oncology

## 2015-03-30 ENCOUNTER — Encounter (HOSPITAL_COMMUNITY): Payer: Self-pay | Admitting: Otolaryngology

## 2015-03-30 ENCOUNTER — Ambulatory Visit: Payer: Medicaid Other

## 2015-03-30 LAB — CBC
HEMATOCRIT: 28.7 % — AB (ref 39.0–52.0)
Hemoglobin: 9.7 g/dL — ABNORMAL LOW (ref 13.0–17.0)
MCH: 32.7 pg (ref 26.0–34.0)
MCHC: 33.8 g/dL (ref 30.0–36.0)
MCV: 96.6 fL (ref 78.0–100.0)
Platelets: 185 10*3/uL (ref 150–400)
RBC: 2.97 MIL/uL — AB (ref 4.22–5.81)
RDW: 14.7 % (ref 11.5–15.5)
WBC: 8.4 10*3/uL (ref 4.0–10.5)

## 2015-03-30 LAB — BASIC METABOLIC PANEL
Anion gap: 8 (ref 5–15)
BUN: <5 mg/dL — ABNORMAL LOW (ref 6–23)
CALCIUM: 9 mg/dL (ref 8.4–10.5)
CO2: 31 mmol/L (ref 19–32)
Chloride: 96 mmol/L (ref 96–112)
Creatinine, Ser: 0.51 mg/dL (ref 0.50–1.35)
GFR calc Af Amer: >90 mL/min (ref >90)
GLUCOSE: 107 mg/dL — AB (ref 70–99)
Potassium: 3.1 mmol/L — ABNORMAL LOW (ref 3.5–5.1)
Sodium: 135 mmol/L (ref 135–145)

## 2015-03-30 LAB — MAGNESIUM: Magnesium: 1.4 mg/dL — ABNORMAL LOW (ref 1.5–2.5)

## 2015-03-30 LAB — PROTIME-INR
INR: 1.14 (ref 0.00–1.49)
PROTHROMBIN TIME: 14.7 s (ref 11.6–15.2)

## 2015-03-30 MED ORDER — MAGNESIUM SULFATE 2 GM/50ML IV SOLN
2.0000 g | Freq: Once | INTRAVENOUS | Status: AC
  Administered 2015-03-30: 2 g via INTRAVENOUS
  Filled 2015-03-30: qty 50

## 2015-03-30 MED ORDER — FENTANYL CITRATE (PF) 100 MCG/2ML IJ SOLN
INTRAMUSCULAR | Status: AC | PRN
  Administered 2015-03-30: 50 ug via INTRAVENOUS

## 2015-03-30 MED ORDER — CEFAZOLIN SODIUM-DEXTROSE 2-3 GM-% IV SOLR
INTRAVENOUS | Status: AC
  Filled 2015-03-30: qty 50

## 2015-03-30 MED ORDER — GLUCAGON HCL RDNA (DIAGNOSTIC) 1 MG IJ SOLR
INTRAMUSCULAR | Status: AC
  Filled 2015-03-30: qty 1

## 2015-03-30 MED ORDER — MIDAZOLAM HCL 2 MG/2ML IJ SOLN
INTRAMUSCULAR | Status: AC
  Filled 2015-03-30: qty 2

## 2015-03-30 MED ORDER — MIDAZOLAM HCL 2 MG/2ML IJ SOLN
INTRAMUSCULAR | Status: AC | PRN
  Administered 2015-03-30: 1 mg via INTRAVENOUS
  Administered 2015-03-30: 0.5 mg via INTRAVENOUS

## 2015-03-30 MED ORDER — ENOXAPARIN SODIUM 40 MG/0.4ML ~~LOC~~ SOLN: 40.0000 mg | SUBCUTANEOUS | Status: DC

## 2015-03-30 MED ORDER — IOHEXOL 300 MG/ML  SOLN
50.0000 mL | Freq: Once | INTRAMUSCULAR | Status: AC | PRN
  Administered 2015-03-30: 10 mL via INTRAVENOUS

## 2015-03-30 MED ORDER — ENOXAPARIN SODIUM 40 MG/0.4ML ~~LOC~~ SOLN
40.0000 mg | SUBCUTANEOUS | Status: DC
  Administered 2015-03-31 – 2015-04-06 (×7): 40 mg via SUBCUTANEOUS
  Filled 2015-03-30 (×9): qty 0.4

## 2015-03-30 MED ORDER — FENTANYL CITRATE (PF) 100 MCG/2ML IJ SOLN
INTRAMUSCULAR | Status: AC
  Filled 2015-03-30: qty 2

## 2015-03-30 MED ORDER — LIDOCAINE HCL 1 % IJ SOLN
INTRAMUSCULAR | Status: AC
  Filled 2015-03-30: qty 20

## 2015-03-30 NOTE — Progress Notes (Signed)
Physical Therapy Treatment Patient Details Name: Kyle Alexander MRN: [id number] DOB: 10-15-1961 Today's Date: 04/23/2015    History of Present Illness Kyle Alexander is a 54 y.o. male with squamous cell carcinoma of oropharynx, hypopharynx, and larynx S/p tracheostomy 4/14 and multiple dental extractions on 4/14.    PT Comments    Pt progressing towards PT goals. Pt demonstrated ambulation with no AD today, however, mildly unsteady and required assist for management of lines/leads.  Current plan for d/c to SNF remains appropriate, as pt lives alone with limited support available.     Follow Up Recommendations  SNF     Equipment Recommendations  None recommended by PT    Recommendations for Other Services       Precautions / Restrictions Precautions Precaution Comments: trach Restrictions Weight Bearing Restrictions: No    Mobility  Bed Mobility               General bed mobility comments: pt on commode upon PT arrival  Transfers Overall transfer level: Needs assistance Equipment used: None Transfers: Sit to/from Stand Sit to Stand: Supervision         General transfer comment: supervision for safety with assist to manage lines/supplemental O2.   Ambulation/Gait Ambulation/Gait assistance: Min guard Ambulation Distance (Feet): 550 Feet Assistive device: None Gait Pattern/deviations: Step-through pattern;Decreased stride length Gait velocity: decreased Gait velocity interpretation: Below normal speed for age/gender General Gait Details: Did not need rolling O2 tank, had assist +2 just to roll O2 and IV pole maintaining min guard assist, pt had 2 episodes of coughing with secretions during ambulation, but other than that tolerated ambulation well    Stairs            Wheelchair Mobility    Modified Rankin (Stroke Patients Only)       Balance Overall balance assessment: Needs assistance         Standing balance support: No upper extremity  supported;During functional activity Standing balance-Leahy Scale: Fair Standing balance comment: Pt able to ambulate with no AD and only min guard                     Cognition Arousal/Alertness: Awake/alert Behavior During Therapy: WFL for tasks assessed/performed Overall Cognitive Status: Within Functional Limits for tasks assessed                      Exercises      General Comments        Pertinent Vitals/Pain Pain Assessment: No/denies pain    Home Living                      Prior Function            PT Goals (current goals can now be found in the care plan section) Progress towards PT goals: Progressing toward goals    Frequency  Min 3X/week    PT Plan Current plan remains appropriate    Co-evaluation             End of Session           Time: 2353-6144 PT Time Calculation (min) (ACUTE ONLY): 25 min  Charges:  $Gait Training: 8-22 mins                    G Codes:      Nedim Oki 04/23/2015, 5:14 PM

## 2015-03-30 NOTE — Consult Note (Signed)
[facility] Consult Note  Kyle Alexander 04/03/1961  [id number].    Requesting MD: Dr. Waymon Budge Chief Complaint/Reason for Consult: Need for feeding tube placement  HPI:  54 y/o AA male with history of HTN, tobacco and alcohol abuse who presented with complaint of an enlarging neck mass, progressive dysphagia, and weight loss for the past several months. Patient was previously seen in urgent care on 07/21/2014 with complaint of progressive discomfort with swallowing and swollen cervical lymph nodes; although further workup was advised then, the patient left and did not return for evaluation. He reports dysphagia to solids, but says he is able to get liquids down. He does not have a primary care physician.  CT scan showed an advanced hypopharyngeal/supraglottic mass, largely with a superficial spreading pattern, as well as massive bilateral necrotic metastatic lymphadenopathy on 03/15/15. The patient underwent cervical lymph node biopsy in IR on 03/16/15 including surgical path and cytology and was ultimately diagnosed with T4N2C Squamous Cell Carcinoma of Oropharynx, Hypopharynx, and Larynx.  He was discharged home on 03/17/15 for OP follow up.  He was re-admitted pending Trach/PEG and dental extractions.  Dr. [name] (ENT) was consulted and he recommends medical oncology and radiation oncology consults.  He is now s/p tracheostomy (Dr. [name]) and multiple dental extractions (Dr. Enrique Sack) on 03/26/15.   IR attempted push method of g-tube placement (03/30/15), but was unsuccessful secondary to the stomach being too high and not safely accessible percutaneously.  We were consulted regarding proceeding with surgical g-tube placement due to dysphagia, PCM, and need for feeding access.   ROS: All systems reviewed and otherwise negative except for as above  Family History  Problem Relation Age of Onset  . Cancer Mother   . Diabetes Mother   . Diabetes Father   . Hypertension Father      Past Medical History  Diagnosis Date  . Hypertension   . Cancer 03/16/15    left cervical lymph node  . Shortness of breath dyspnea   . GERD (gastroesophageal reflux disease)   . Seizures     Past Surgical History  Procedure Laterality Date  . Knee surgery  1998    Left  . Tonsillectomy and adenoidectomy    . Lymph node biopsy Left 03/16/15    left cervical, consistent with squamous cell carcinoma  . Hernia repair      right groin  . Tonsillectomy    . Tracheostomy tube placement N/A 03/26/2015    Procedure: TRACHEOSTOMY ;  Surgeon: [name], MD;  Location: [place];  Service: ENT;  Laterality: N/A;  . Tooth extraction  03/26/2015    Procedure: [place];  Surgeon: [name], MD;  Location: [place];  Service: ENT;;  . Multiple extractions with alveoloplasty N/A 03/26/2015    Procedure: EXTRACTION OF TOOTH #'S 1,2,5,6,7,8,9,10,11,12,13,18,19,21,22,23,24,25,26,27,28,29  WITH AVELOPLASTY;  Surgeon: Lenn Cal, DDS;  Location: [place];  Service: Oral Surgery;  Laterality: N/A;    Social History:  reports that he has been smoking.  He has quit using smokeless tobacco. His smokeless tobacco use included Chew. He reports that he drinks about 18.0 oz of alcohol per week. He reports that he uses illicit drugs (Marijuana).  Allergies:  Allergies  Allergen Reactions  . Aspirin     Makes me bleed    Medications Prior to Admission  Medication Sig Dispense Refill  . feeding supplement, RESOURCE BREEZE, (RESOURCE BREEZE) LIQD Take 1 Container by mouth 3 (three) times daily between meals.  0  .  ibuprofen (ADVIL,MOTRIN) 200 MG tablet Take 200 mg by mouth every 6 (six) hours as needed for mild pain.    . ergocalciferol (DRISDOL) 8000 UNIT/ML drops Take 6.3 mLs (50,000 Units total) by mouth once a week. (Patient not taking: Reported on 03/20/2015) 60 mL 7    Blood pressure 130/76, pulse 84, temperature 98.5 F (36.9 C), temperature source Oral, resp. rate 16, height 5' 10.5" (1.791 m),  weight 61.3 kg (135 lb 2.3 oz), SpO2 98 %. Physical Exam: General: pleasant, WD/WN AA male who is laying in bed in NAD, coughing with trach in place HEENT: Trach in place, thick secretions noted, head is atraumatic.  Sclera are noninjected.  PERRL.  Ears and nose without any masses or lesions.  Mouth is pink and moist Heart: regular, rate, and rhythm.  No obvious murmurs, gallops, or rubs noted.  Palpable pedal pulses bilaterally Lungs: CTAB, no wheezes, rhonchi, or rales noted.  Respiratory effort nonlabored Abd: soft, NT/ND, +BS, no masses, hernias, or organomegaly, no abdominal scars noted, states he had hernia repair to right groin - feel a cyst in right groin, but do not see a scar MS: all 4 extremities are symmetrical with no cyanosis, clubbing, or edema. Skin: warm and dry with no masses, lesions, or rashes Psych: A&Ox3 with an appropriate affect.   Results for orders placed or performed during the hospital encounter of 03/26/15 (from the past 48 hour(s))  CBC     Status: Abnormal   Collection Time: 03/29/15  4:10 AM  Result Value Ref Range   WBC 10.4 4.0 - 10.5 K/uL   RBC 3.14 (L) 4.22 - 5.81 MIL/uL   Hemoglobin 10.2 (L) 13.0 - 17.0 g/dL   HCT 30.1 (L) 39.0 - 52.0 %   MCV 95.9 78.0 - 100.0 fL   MCH 32.5 26.0 - 34.0 pg   MCHC 33.9 30.0 - 36.0 g/dL   RDW 14.7 11.5 - 15.5 %   Platelets 176 150 - 400 K/uL  Magnesium     Status: Abnormal   Collection Time: 03/29/15  4:10 AM  Result Value Ref Range   Magnesium 1.4 (L) 1.5 - 2.5 mg/dL  Protime-INR     Status: None   Collection Time: 03/30/15  3:45 AM  Result Value Ref Range   Prothrombin Time 14.7 11.6 - 15.2 seconds   INR 1.14 0.00 - 1.49  CBC     Status: Abnormal   Collection Time: 03/30/15  3:45 AM  Result Value Ref Range   WBC 8.4 4.0 - 10.5 K/uL   RBC 2.97 (L) 4.22 - 5.81 MIL/uL   Hemoglobin 9.7 (L) 13.0 - 17.0 g/dL   HCT 28.7 (L) 39.0 - 52.0 %   MCV 96.6 78.0 - 100.0 fL   MCH 32.7 26.0 - 34.0 pg   MCHC 33.8 30.0 -  36.0 g/dL   RDW 14.7 11.5 - 15.5 %   Platelets 185 150 - 400 K/uL  Magnesium     Status: Abnormal   Collection Time: 03/30/15  3:45 AM  Result Value Ref Range   Magnesium 1.4 (L) 1.5 - 2.5 mg/dL  Basic metabolic panel     Status: Abnormal   Collection Time: 03/30/15  3:45 AM  Result Value Ref Range   Sodium 135 135 - 145 mmol/L   Potassium 3.1 (L) 3.5 - 5.1 mmol/L   Chloride 96 96 - 112 mmol/L   CO2 31 19 - 32 mmol/L   Glucose, Bld 107 (H) 70 - 99 mg/dL     BUN <5 (L) 6 - 23 mg/dL   Creatinine, Ser 0.51 0.50 - 1.35 mg/dL   Calcium 9.0 8.4 - 10.5 mg/dL   GFR calc non Af Amer >90 >90 mL/min   GFR calc Af Amer >90 >90 mL/min    Comment: (NOTE) The eGFR has been calculated using the CKD EPI equation. This calculation has not been validated in all clinical situations. eGFR's persistently <90 mL/min signify possible Chronic Kidney Disease.    Anion gap 8 5 - 15   Ir Fluoro Rm 30-60 Min  03/30/2015   CLINICAL DATA:  Advanced head and neck malignancy, malnutrition  EXAM: FLUOROSCOPIC EXAM OF THE STOMACH. (UNABLE TO PLACE PERCUTANEOUS GASTROSTOMY)  Date:  4/18/20164/18/2016 2:31 pm  Radiologist:  M. Trevor Shick, MD  Guidance:  Fluoroscopic  FLUOROSCOPY TIME:  1 minutes 18 seconds, 9 mGy  MEDICATIONS AND MEDICAL HISTORY: 1 mg Versed, 50 mcg fentanyl  ANESTHESIA/SEDATION: 15 minutes  CONTRAST:  10mL OMNIPAQUE IOHEXOL 300 MG/ML  SOLN  COMPLICATIONS: None immediate  PROCEDURE: Informed consent was obtained from the patient following explanation of the procedure, risks, benefits and alternatives. The patient understands, agrees and consents for the procedure. All questions were addressed. A time out was performed.  Initially, a 5 French catheter was advanced through the oral cavity into the stomach under fluoroscopy. This was performed after conscious sedation. This was utilized to insufflate the stomach with air. Stomach was noted to be high in the abdomen beneath the left subcostal margin despite  adequate insufflation of the stomach with air. The stomach does not extend inferiorly below the subcostal margin to allow percutaneous needle access safely. Therefore the procedure cannot be performed with percutaneous fluoroscopic technique.  IMPRESSION: High positioned stomach beneath the left subcostal margin, not accessible safely by percutaneous fluoroscopic technique without risking bowel injury.  PLAN: Findings discussed with the patient's medical staff. Recommend surgical consultation for gastrostomy placement.  COMPARISON:  03/16/2015   Electronically Signed   By: M.  Shick M.D.   On: 03/30/2015 14:54      Assessment/Plan T4N2C Squamous Cell Carcinoma of Oropharynx, Hypopharynx, and Larynx s/p tracheostomy and multiple dental extractions on 4/14  Need for feeding tube/Dysphagia/malnutrition Tobacco abuse Alcohol abuse Marijuana abuse HTN Thoracic Aortic Dilation - 4 cm ascending aortic dilation seen on CTA chest.  Plan: 1.  Plan for laparotomy with G-tube placement for feeding support tomorrow 2.  He is okay to eat until midnight then NPO, I would have primary team check with SLP to make sure he's able to advance his diet because he has not tried the passey muir valve yet. 3.  Will be able to start tube feedings starting 24 hours after procedure. 4.  Dr. Wakefield to see   DORT, Hawk Mones, PA-C [facility] 03/30/2015, 3:16 PM Pager: [phone]  

## 2015-03-30 NOTE — Evaluation (Signed)
[facility] Speaking Valve - Evaluation Patient Details  Name: Kyle Alexander MRN: [id number] Date of Birth: 1961/03/12  Today's Date: 03/30/2015 Time: 1550-1600 SLP Time Calculation (min) (ACUTE ONLY): 10 min  Past Medical History:  Past Medical History  Diagnosis Date  . Hypertension   . Cancer 03/16/15    left cervical lymph node  . Shortness of breath dyspnea   . GERD (gastroesophageal reflux disease)   . Seizures    Past Surgical History:  Past Surgical History  Procedure Laterality Date  . Knee surgery  1998    Left  . Tonsillectomy and adenoidectomy    . Lymph node biopsy Left 03/16/15    left cervical, consistent with squamous cell carcinoma  . Hernia repair      right groin  . Tonsillectomy    . Tracheostomy tube placement N/A 03/26/2015    Procedure: TRACHEOSTOMY ;  Surgeon: [name], MD;  Location: [facility];  Service: ENT;  Laterality: N/A;  . Tooth extraction  03/26/2015    Procedure: [place];  Surgeon: [name], MD;  Location: [place];  Service: ENT;;  . Multiple extractions with alveoloplasty N/A 03/26/2015    Procedure: EXTRACTION OF TOOTH #'S 1,2,5,6,7,8,9,10,11,12,13,18,19,21,22,23,24,25,26,27,28,29  WITH AVELOPLASTY;  Surgeon: Lenn Cal, DDS;  Location: [facility];  Service: Oral Surgery;  Laterality: N/A;   HPI:  JOVAHN BREIT is a 54 y.o. male with squamous cell carcinoma of oropharynx, hypopharynx, and larynx S/p tracheostomy 4/14 and multiple dental extractions on 4/14.   Assessment / Plan / Recommendation Clinical Impression  PMV was placed for ~10 minutes with low intensity phonation obtained. Vocal quality was breathy and wet, concerning for decreased glottal closure as well as reduced ability to manage secretions. Cued cough was weak and does not sufficiently clear vocal quality. Despite moderately reduced breath support, pt participated in conversation with Mod cues from SLP for increased volume and slower pacing to improve overall intelligiblity.  Vital signs remained stable throughout initial trial without evidence of air trapping, however pt became increasingly tired and requested to rest. PMV removed by SLP. Given brief trial observed today as well as concern for decreased upper airway patency secondary to mass, would recommend continued trials with SLP only at this time. Will cotninue to follow for more prolonged trials to assess tolerance.    SLP Assessment  Patient needs continued Speech Lanaguage Pathology Services    Follow Up Recommendations  Skilled Nursing facility    Frequency and Duration min 2x/week  2 weeks   Pertinent Vitals/Pain [facility]    SLP Goals Potential to Achieve Goals (ACUTE ONLY): Good   PMSV Trial  PMSV was placed for: ~10 minutes Able to redirect subglottic air through upper airway: Yes Able to Attain Phonation: Yes Voice Quality: Low vocal intensity;Breathy;Wet Able to Expectorate Secretions: No attempts Level of Secretion Expectoration with PMSV: Not observed Breath Support for Phonation: Moderately decreased Intelligibility: Intelligibility reduced Conversation: 50-74% accurate SpO2 During Trial: 99 % Behavior: Alert;Controlled;Responsive to questions;Other (comment) (tired)   Vent Dependency  FiO2 (%): 28 %    Cuff Deflation Trial Tolerated Cuff Deflation: Yes Length of Time for Cuff Deflation Trial: cuff already deflated    Germain Osgood, M.A. CCC-SLP [phone]  Germain Osgood 03/30/2015, 4:36 PM

## 2015-03-30 NOTE — Progress Notes (Signed)
Subjective:  Pt seen and examined in AM. No acute events overnight. He is still unable to talk with trach in place. He continues to have difficulty with swallowing liquids and increased secretions. He is making good urine output (2.4 L out).    Objective: Vital signs in last 24 hours: Filed Vitals:   03/30/15 0407 03/30/15 0629 03/30/15 0813 03/30/15 1107  BP:  122/88    Pulse: 85 84 80 89  Temp:  98.6 F (37 C)    TempSrc:      Resp: 18 16 18 20   Height:      Weight:  135 lb 2.3 oz (61.3 kg)    SpO2: 100% 99% 99% 100%   Weight change: 7.1 oz (0.2 kg)  Intake/Output Summary (Last 24 hours) at 03/30/15 1254 Last data filed at 03/30/15 0900  Gross per 24 hour  Intake 2663.75 ml  Output   2500 ml  Net 163.75 ml   PHYSICAL EXAMINATION: General: NAD, cachectic appearing  HEENT:  Edentulous. Bilateral cervical lymphadenopathy.  Lungs: Trach collar in place. Clear to ausculation bilaterally. No wheezing, rhonchi, or rales.  Heart: Normal rate and rhythm  Abdomen: Soft, non-distended, non-tender. Normal BS. Extremities: No edema Neuro: A& O x 3.    Lab Results: Basic Metabolic Panel:  Recent Labs Lab 03/27/15 0256 03/28/15 0450 03/29/15 0410 03/30/15 0345  NA 136 135  --  135  K 4.6 3.6  --  3.1*  CL 96 93*  --  96  CO2 27 27  --  31  GLUCOSE 83 73  --  107*  BUN 7 <5*  --  <5*  CREATININE 0.91 0.70  --  0.51  CALCIUM 9.6 9.5  --  9.0  MG 1.3* 1.5 1.4* 1.4*  PHOS 3.1  --   --   --    Liver Function Tests:  Recent Labs Lab 03/27/15 0256  AST 17  ALT 9  ALKPHOS 59  BILITOT 1.2  PROT 6.4  ALBUMIN 2.4*   CBC:  Recent Labs Lab 03/27/15 0256  03/29/15 0410 03/30/15 0345  WBC 14.2*  < > 10.4 8.4  NEUTROABS 11.9*  --   --   --   HGB 10.7*  < > 10.2* 9.7*  HCT 32.0*  < > 30.1* 28.7*  MCV 97.6  < > 95.9 96.6  PLT 257  < > 176 185  < > = values in this interval not  displayed. CBG:  Recent Labs Lab 03/24/15 0734 03/26/15 1437  GLUCAP 85 114*   Urine Drug Screen: Drugs of Abuse     Component Value Date/Time   LABOPIA NONE DETECTED 03/15/2015 1533   COCAINSCRNUR NONE DETECTED 03/15/2015 1533   LABBENZ NONE DETECTED 03/15/2015 1533   AMPHETMU NONE DETECTED 03/15/2015 1533   THCU POSITIVE* 03/15/2015 1533   LABBARB NONE DETECTED 03/15/2015 1533      Studies/Results: No results found. Medications: I have reviewed the patient's current medications. Scheduled Meds: .  ceFAZolin (ANCEF) IV  2 g Intravenous On Call  . chlorhexidine  15 mL Mouth/Throat BID  . enoxaparin (LOVENOX) injection  40 mg Subcutaneous Q24H  . ergocalciferol  50,000 Units Oral Weekly  . feeding supplement (RESOURCE BREEZE)  1 Container Oral TID BM  . folic acid  1 mg Intravenous Daily  . multivitamin  5 mL Oral Daily  . nicotine  21 mg Transdermal Daily  . thiamine  100 mg Oral Daily   Or  . thiamine  100 mg Intravenous Daily   Continuous Infusions: . lactated ringers 75 mL/hr at 03/30/15 0515  . lactated ringers Stopped (03/28/15 1914)  . sodium chloride irrigation     PRN Meds:.HYDROmorphone (DILAUDID) injection, LORazepam **OR** LORazepam, morphine injection, sodium chloride Assessment/Plan:  T4N2C Squamous Cell Carcinoma of Oropharynx, Hypopharynx, and Larynx s/p tracheostomy and multiple dental extractions on 4/14 -  Currently with 100% SpO2 on trach collar with 5L O2 flow. Pt with moderately controlled pain. Prealbumin 11 on 4/3.  -Appreciate ENT, dentistry, and IR recommendations -Plan for direct push approach g-tube placement today -Tracheostomy and oral care per shift, per ENT downsize to cuffless tube  -PET scan scheduled for April 22 -Continue IV morphine 2 mg Q 2 hr and IV dilaudid 1 mg Q 4 hr PRN -Clear diet, will advance as tolerated, continue LR 75 mL/hr until adequate PO intake -Chlorhexidine rinses BID and salt rinses Q 2 hr in between rinses    -Suture removal in 4-7 days (self-dissolvable) -Appreciate dietician consult, continue resource breeze 1 can TID   -Appreciate SW consult and PT & OT consults, recommend SNF, will need to find out if pt amenable  -SLP evaluation today, to start working with Sharen Heck valve per ENT   Hypomagnesemia - Mg 1.4 this AM in setting of chronic alcohol abuse.  -Administer IV magnesium sulfate 2 g -Continue to monitor Mg level  History of Hypertension - Currently normotensive. Pt not on antihypertensives at home. -Consider adding amlodipine 5 mg daily if uncontrolled  Acute Blood Loss Anemia - Hg 9.7 from 13.9 on admission with no active bleeding or hemodynamic instability. Pt with no prior history of anemia with 100 mL EBL from recent dental extraction. -Continue to monitor CBC -Monitor for bleeding and transfuse if <7 -Obtain FOBT  Tobacco Abuse - Pt with 1-2 pack history for approx past 30 years.  -Continue nicotine 21 mg patch daily  -Pt counseled on tobacco cessation   Alcohol Abuse - Date of last drink unclear. History of remote alcohol-withdrawal seizures.   -Continue CIWA protocol  -Counsel on cessation  Vitamin D Deficiency  - Level < 4 on 03/15/15. -Continue liquid ergocalciferol 50 K U weekly (Week 2/8)  Folate Deficiency - Folate level low at 3.2 on 03/15/15.  -Continue IV folate 1 mg daily   Marijuana Abuse - Pt reports smoking THC 1-2 times weekly.  -Pt counseled on cessation   Thoracic Aortic Dilation - 4 cm ascending aortic dilation seen on CTA chest. -Needs follow-up CTA or MRA in 1 year  Prolonged QT - EKG on 03/15/15 with QTc of 470 in setting of hypomagnesemia and chronic alcohol abuse.  -Avoid prolonging QT medications  -Replete magnesium   Diet: Clears  DVT Ppx: Lovenox, SCD's Code: Full   Dispo: Disposition is deferred at this time, awaiting improvement of current medical problems.  Anticipated discharge in approximately 3 day(s).   The patient  does have a current PCP (Pcp Not In System) and does need an [facility] hospital follow-up appointment after discharge.  The patient does have transportation limitations  that hinder transportation to clinic appointments.  .Services Needed at time of discharge: Y = Yes, Blank = No PT:   OT:   RN:   Equipment:   Other:     LOS: 4 days   Juluis Mire, MD 03/30/2015, 12:54 PM

## 2015-03-30 NOTE — Sedation Documentation (Signed)
procedure cancelled per radiologist, Shick.

## 2015-03-30 NOTE — Progress Notes (Signed)
Subjective: No acute events overnight. Patient nervous about PEG tube placement today. Family reporting increased secretions requiring hourly suctioning.   Objective: Vital signs in last 24 hours: Filed Vitals:   03/30/15 0407 03/30/15 0629 03/30/15 0813 03/30/15 1107  BP:  122/88    Pulse: 85 84 80 89  Temp:  98.6 F (37 C)    TempSrc:      Resp: 18 16 18 20   Height:      Weight:  61.3 kg (135 lb 2.3 oz)    SpO2: 100% 99% 99% 100%   Weight change: 0.2 kg (7.1 oz)  Intake/Output Summary (Last 24 hours) at 03/30/15 1245 Last data filed at 03/30/15 0900  Gross per 24 hour  Intake 2663.75 ml  Output   2500 ml  Net 163.75 ml   General: resting in bed HEENT: PERRL, EOMI, no scleral icterus Cardiac: RRR, no rubs, murmurs or gallops Pulm: inspiratory effort compromised in setting of tracheostomy Abd: soft, nontender, nondistended, BS present Ext: warm and well perfused, no pedal edema Neuro: alert and oriented X3, cranial nerves II-XII grossly intact  Lab Results: Basic Metabolic Panel:  Recent Labs  03/28/15 0450 03/29/15 0410 03/30/15 0345  NA 135  --  135  K 3.6  --  3.1*  CL 93*  --  96  CO2 27  --  31  GLUCOSE 73  --  107*  BUN <5*  --  <5*  CREATININE 0.70  --  0.51  CALCIUM 9.5  --  9.0  MG 1.5 1.4* 1.4*   CBC:  Recent Labs  03/29/15 0410 03/30/15 0345  WBC 10.4 8.4  HGB 10.2* 9.7*  HCT 30.1* 28.7*  MCV 95.9 96.6  PLT 176 185   Coagulation:  Recent Labs  03/30/15 0345  LABPROT 14.7  INR 1.14   Urine Drug Screen: Drugs of Abuse     Component Value Date/Time   LABOPIA NONE DETECTED 03/15/2015 1533   COCAINSCRNUR NONE DETECTED 03/15/2015 1533   LABBENZ NONE DETECTED 03/15/2015 1533   AMPHETMU NONE DETECTED 03/15/2015 1533   THCU POSITIVE* 03/15/2015 1533   LABBARB NONE DETECTED 03/15/2015 1533    Medications: I have reviewed the patient's current medications. Scheduled Meds: .  ceFAZolin (ANCEF) IV  2 g Intravenous On Call  .  chlorhexidine  15 mL Mouth/Throat BID  . enoxaparin (LOVENOX) injection  40 mg Subcutaneous Q24H  . ergocalciferol  50,000 Units Oral Weekly  . feeding supplement (RESOURCE BREEZE)  1 Container Oral TID BM  . folic acid  1 mg Intravenous Daily  . multivitamin  5 mL Oral Daily  . nicotine  21 mg Transdermal Daily  . thiamine  100 mg Oral Daily   Or  . thiamine  100 mg Intravenous Daily   Continuous Infusions: . lactated ringers 75 mL/hr at 03/30/15 0515  . lactated ringers Stopped (03/28/15 1914)  . sodium chloride irrigation     PRN Meds:.HYDROmorphone (DILAUDID) injection, LORazepam **OR** LORazepam, morphine injection, sodium chloride Assessment/Plan: Active Problems:   HTN (hypertension)   Tobacco abuse   Alcohol abuse   Hypokalemia   Marijuana abuse   Folate deficiency   Protein-calorie malnutrition, severe   Vitamin D deficiency   Squamous cell carcinoma of supraglottis   Head and neck cancer  T4N2C Squamous Cell Carcinoma of Oropharynx, Hypopharynx, and Larynx s/p tracheostomy and multiple dental extractions on 4/14 - Currently with 100% SpO2 on trach collar with well-controlled pain. Prealbumin 11 on 4/3. PT/OT evaluated, recommending  SNF or maximizing home health including HHPT and RN. -Plan for direct push approach g-tube placement today -Appreciate ENT, dentistry, and IR recommendations - per ENT, will downsize to cuffless tube - f/u speech evaluation, will begin working with Evonnie Dawes valve - PT/OT rec SNF, can consider if patient amenable -Tracheostomy and oral care per shift -PET scan scheduled for April 22, will need to be OP -Fentanyl 25-100 mcg Q2 hr PRN pain -Advance diet from clears as tolerated, continue LR 75 mL/hr until adequate PO intake -Chlorhexidine rinses BID and salt rinses Q 2 hr in between rinses  -Suture removal 7-10 days post-op (self-dissolvable) -Appreciate dietician consult, start resource breeze 1 can TID    Hypomagnesemia - Mg  1.4 this AM in setting of chronic alcohol abuse.  -Administer IV Magnesium sulfate 2 g -Continue to monitor Mg level  Hypertension - Currently normotensive. Pt not on antihypertensives at home. -Consider adding amlodipine 5 mg daily if uncontrolled  Acute Blood Loss Anemia - Hg 9.7 from 13.9 on admission with no active bleeding or hemodynamic instability. Pt with no prior history of anemia with 100 mL EBL from recent dental extraction. -Continue to monitor CBC -Monitor for bleeding and transfuse if <7  Tobacco Abuse - Pt with 1-2 pack history for approx past 30 years.  -Continue nicotine 21 mg patch daily  -Pt counseled on tobacco cessation   Alcohol Abuse - Date of last drink unclear. History of remote alcohol-withdrawal seizures.  -Continue CIWA protocol  -Counsel on cessation  Vitamin D Deficiency - Level < 4 on 03/15/15. -Continue liquid ergocalciferol 50 K U weekly (Week 2/8)  Folate Deficiency - Folate level low at 3.2 on 03/15/15.  -Continue IV folate 1 mg daily   Marijuana Abuse - Pt reports smoking THC 1-2 times weekly.  -Pt counseled on cessation   Thoracic Aortic Dilation - 4 cm ascending aortic dilation seen on CTA chest. -Needs follow-up CTA or MRA in 1 year  Prolonged QT - EKG on 03/15/15 with QTc of 470 in setting of hypomagnesemia (1.4) and chronic alcohol abuse.  -Avoid prolonging QT medications  -Replete magnesium   Diet: Clears  DVT Ppx: Lovenox, SCD's Code: Full   Dispo: Disposition is deferred at this time, awaiting improvement of current medical problems. Anticipated discharge in approximately 3-5 day(s).   The patient does have a current PCP (Pcp Not In System) and does need an [facility] hospital follow-up appointment after discharge.  The patient does have transportation limitations that hinder transportation to clinic appointments. .Services Needed at time of discharge: Y = Yes, Blank = No PT:   OT:   RN:   Equipment:   Other:     LOS: 4  days   Carolan Shiver, Med Student 03/30/2015, 12:45 PM

## 2015-03-30 NOTE — Progress Notes (Signed)
03/30/2015 12:14 PM  Kyle Alexander [id number]  Post-Op Day 4    Temp:  [98.6 F (37 C)-99.1 F (37.3 C)] 98.6 F (37 C) (04/18 0629) Pulse Rate:  [80-103] 89 (04/18 1107) Resp:  [16-20] 20 (04/18 1107) BP: (121-134)/(76-88) 122/88 mmHg (04/18 0629) SpO2:  [98 %-100 %] 100 % (04/18 1107) FiO2 (%):  [28 %] 28 % (04/18 1107) Weight:  [61.3 kg (135 lb 2.3 oz)] 61.3 kg (135 lb 2.3 oz) (04/18 0629),     Intake/Output Summary (Last 24 hours) at 03/30/15 1214 Last data filed at 03/30/15 0900  Gross per 24 hour  Intake 2663.75 ml  Output   2500 ml  Net 163.75 ml    Results for orders placed or performed during the hospital encounter of 03/26/15 (from the past 24 hour(s))  Protime-INR     Status: None   Collection Time: 03/30/15  3:45 AM  Result Value Ref Range   Prothrombin Time 14.7 11.6 - 15.2 seconds   INR 1.14 0.00 - 1.49  CBC     Status: Abnormal   Collection Time: 03/30/15  3:45 AM  Result Value Ref Range   WBC 8.4 4.0 - 10.5 K/uL   RBC 2.97 (L) 4.22 - 5.81 MIL/uL   Hemoglobin 9.7 (L) 13.0 - 17.0 g/dL   HCT 28.7 (L) 39.0 - 52.0 %   MCV 96.6 78.0 - 100.0 fL   MCH 32.7 26.0 - 34.0 pg   MCHC 33.8 30.0 - 36.0 g/dL   RDW 14.7 11.5 - 15.5 %   Platelets 185 150 - 400 K/uL  Magnesium     Status: Abnormal   Collection Time: 03/30/15  3:45 AM  Result Value Ref Range   Magnesium 1.4 (L) 1.5 - 2.5 mg/dL  Basic metabolic panel     Status: Abnormal   Collection Time: 03/30/15  3:45 AM  Result Value Ref Range   Sodium 135 135 - 145 mmol/L   Potassium 3.1 (L) 3.5 - 5.1 mmol/L   Chloride 96 96 - 112 mmol/L   CO2 31 19 - 32 mmol/L   Glucose, Bld 107 (H) 70 - 99 mg/dL   BUN <5 (L) 6 - 23 mg/dL   Creatinine, Ser 0.51 0.50 - 1.35 mg/dL   Calcium 9.0 8.4 - 10.5 mg/dL   GFR calc non Af Amer >90 >90 mL/min   GFR calc Af Amer >90 >90 mL/min   Anion gap 8 5 - 15    SUBJECTIVE:  Breathing better.  Family notes increased secretions.  On deck for G tube placement later  today  OBJECTIVE:  Trach clean and secure.   Cuff deflated  IMPRESSION:  Satisfactory check  PLAN:  Pt and family trach and G tube care teaching.  I will downsize to a cuffless tube and have Speech start working with Evonnie Dawes valve now.    [name]

## 2015-03-30 NOTE — Evaluation (Signed)
Clinical/Bedside Swallow Evaluation Patient Details  Name: Kyle Alexander MRN: [id number] Date of Birth: [birth year]  Today's Date: 03/30/2015 Time: SLP Start Time (ACUTE ONLY): 1600 SLP Stop Time (ACUTE ONLY): 1615 SLP Time Calculation (min) (ACUTE ONLY): 15 min  Past Medical History:  Past Medical History  Diagnosis Date  . Hypertension   . Cancer 03/16/15    left cervical lymph node  . Shortness of breath dyspnea   . GERD (gastroesophageal reflux disease)   . Seizures    Past Surgical History:  Past Surgical History  Procedure Laterality Date  . Knee surgery  1998    Left  . Tonsillectomy and adenoidectomy    . Lymph node biopsy Left 03/16/15    left cervical, consistent with squamous cell carcinoma  . Hernia repair      right groin  . Tonsillectomy    . Tracheostomy tube placement N/A 03/26/2015    Procedure: TRACHEOSTOMY ;  Surgeon: [name], MD;  Location: [place];  Service: ENT;  Laterality: N/A;  . Tooth extraction  03/26/2015    Procedure: [place];  Surgeon: [name], MD;  Location: [place];  Service: ENT;;  . Multiple extractions with alveoloplasty N/A 03/26/2015    Procedure: EXTRACTION OF TOOTH #'S 1,2,5,6,7,8,9,10,11,12,13,18,19,21,22,23,24,25,26,27,28,29  WITH AVELOPLASTY;  Surgeon: Lenn Cal, DDS;  Location: [place];  Service: Oral Surgery;  Laterality: N/A;   HPI:  Kyle Alexander is a 54 y.o. male with squamous cell carcinoma of oropharynx, hypopharynx, and larynx S/p tracheostomy 4/14 and multiple dental extractions on 4/14.   Assessment / Plan / Recommendation Clinical Impression  Pt consumed small sips of thin liquids followed by prolonged, delayed coughing episode. PMV not in place for PO intake, as pt had initiated drinking prior to SLP arrival, and after coughing episode pt did not want to continue drinking. Note that plan is for PEG tomorrow. Recommend to maintain NPO except for ice chips after oral care until further assessment can be completed with  PMV in place. Pt may benefit from objective assessment.    Aspiration Risk  Severe    Diet Recommendation NPO;Ice chips PRN after oral care   Medication Administration: Via alternative means    Other  Recommendations Oral Care Recommendations: Oral care Q4 per protocol;Oral care prior to ice chips   Follow Up Recommendations  Skilled Nursing facility    Frequency and Duration min 2x/week  2 weeks   Pertinent Vitals/Pain [facility]    SLP Swallow Goals     Swallow Study Prior Functional Status       General HPI: Kyle Alexander is a 54 y.o. male with squamous cell carcinoma of oropharynx, hypopharynx, and larynx S/p tracheostomy 4/14 and multiple dental extractions on 4/14. Previous Swallow Assessment: BSE 4/5 recommending Dys 3 diet and thin liquids with use of strategies to mitigate risk Temperature Spikes Noted:  (low grade, 99.1) Respiratory Status: Trach collar Trach Size and Type: Cuff History of Recent Intubation: No Behavior/Cognition: Alert;Cooperative;Pleasant mood (tired) Oral Cavity - Dentition: Edentulous (s/p tooth extraction) Baseline Vocal Quality: Wet;Breathy;Low vocal intensity Volitional Cough: Weak    Oral/Motor/Sensory Function Overall Oral Motor/Sensory Function: Appears within functional limits for tasks assessed   Ice Chips Ice chips: Not tested   Thin Liquid Thin Liquid: Impaired Presentation:  (can) Pharyngeal  Phase Impairments: Cough - Delayed    Nectar Thick Nectar Thick Liquid: Not tested   Honey Thick Honey Thick Liquid: Not tested   Puree Puree: Not tested   Solid  Solid: Not tested      Kyle Alexander, M.A. CCC-SLP [phone]  Kyle Alexander 03/30/2015,4:44 PM

## 2015-03-30 NOTE — Procedures (Signed)
Unable to place perc Gtube  Stomach is high beneath the subcostal margin and not safely accessible percutaneously D/w with family rec surgical consult for Gtube No comp

## 2015-03-31 ENCOUNTER — Inpatient Hospital Stay (HOSPITAL_COMMUNITY): Payer: Medicaid Other | Admitting: Anesthesiology

## 2015-03-31 ENCOUNTER — Encounter (HOSPITAL_COMMUNITY)
Admission: RE | Discharge status: Against Medical Advice | Payer: Self-pay | Source: Ambulatory Visit | Attending: Family Medicine

## 2015-03-31 DIAGNOSIS — E876 Hypokalemia: Secondary | ICD-10-CM

## 2015-03-31 HISTORY — PX: GASTROSTOMY: SHX5249

## 2015-03-31 LAB — CBC
HEMATOCRIT: 28.6 % — AB (ref 39.0–52.0)
HEMOGLOBIN: 9.7 g/dL — AB (ref 13.0–17.0)
MCH: 32.7 pg (ref 26.0–34.0)
MCHC: 33.9 g/dL (ref 30.0–36.0)
MCV: 96.3 fL (ref 78.0–100.0)
Platelets: 189 10*3/uL (ref 150–400)
RBC: 2.97 MIL/uL — AB (ref 4.22–5.81)
RDW: 14.4 % (ref 11.5–15.5)
WBC: 9.3 10*3/uL (ref 4.0–10.5)

## 2015-03-31 LAB — BASIC METABOLIC PANEL
Anion gap: 11 (ref 5–15)
BUN: <5 mg/dL — ABNORMAL LOW (ref 6–23)
CALCIUM: 8.7 mg/dL (ref 8.4–10.5)
CO2: 29 mmol/L (ref 19–32)
Chloride: 95 mmol/L — ABNORMAL LOW (ref 96–112)
Creatinine, Ser: 0.52 mg/dL (ref 0.50–1.35)
GFR calc Af Amer: >90 mL/min (ref >90)
GFR calc non Af Amer: >90 mL/min (ref >90)
GLUCOSE: 77 mg/dL (ref 70–99)
Potassium: 3.3 mmol/L — ABNORMAL LOW (ref 3.5–5.1)
SODIUM: 135 mmol/L (ref 135–145)

## 2015-03-31 SURGERY — INSERTION OF GASTROSTOMY TUBE
Anesthesia: General | Site: Abdomen

## 2015-03-31 MED ORDER — LACTATED RINGERS IV SOLN
INTRAVENOUS | Status: DC | PRN
  Administered 2015-03-31: 09:00:00 via INTRAVENOUS

## 2015-03-31 MED ORDER — POTASSIUM CHLORIDE 10 MEQ/100ML IV SOLN
10.0000 meq | INTRAVENOUS | Status: AC
  Administered 2015-03-31 (×3): 10 meq via INTRAVENOUS
  Filled 2015-03-31: qty 100

## 2015-03-31 MED ORDER — OXYCODONE HCL 5 MG/5ML PO SOLN: 5.0000 mg | Freq: Once | ORAL | Status: DC | PRN

## 2015-03-31 MED ORDER — GLYCOPYRROLATE 0.2 MG/ML IJ SOLN
INTRAMUSCULAR | Status: AC
  Filled 2015-03-31: qty 4

## 2015-03-31 MED ORDER — ONDANSETRON HCL 4 MG/2ML IJ SOLN
INTRAMUSCULAR | Status: DC | PRN
  Administered 2015-03-31: 4 mg via INTRAVENOUS

## 2015-03-31 MED ORDER — MIDAZOLAM HCL 2 MG/2ML IJ SOLN
INTRAMUSCULAR | Status: AC
  Filled 2015-03-31: qty 2

## 2015-03-31 MED ORDER — PHENYLEPHRINE 40 MCG/ML (10ML) SYRINGE FOR IV PUSH (FOR BLOOD PRESSURE SUPPORT)
PREFILLED_SYRINGE | INTRAVENOUS | Status: AC
  Filled 2015-03-31: qty 10

## 2015-03-31 MED ORDER — NEOSTIGMINE METHYLSULFATE 10 MG/10ML IV SOLN
INTRAVENOUS | Status: DC | PRN
  Administered 2015-03-31: 3.5 mg via INTRAVENOUS

## 2015-03-31 MED ORDER — LACTATED RINGERS IV SOLN
INTRAVENOUS | Status: DC
  Administered 2015-03-31 – 2015-04-01 (×3): via INTRAVENOUS
  Administered 2015-04-02: 50 mL/h via INTRAVENOUS

## 2015-03-31 MED ORDER — ONDANSETRON HCL 4 MG/2ML IJ SOLN: 4.0000 mg | Freq: Four times a day (QID) | INTRAMUSCULAR | Status: DC | PRN

## 2015-03-31 MED ORDER — ROCURONIUM BROMIDE 100 MG/10ML IV SOLN
INTRAVENOUS | Status: DC | PRN
  Administered 2015-03-31: 35 mg via INTRAVENOUS

## 2015-03-31 MED ORDER — MORPHINE SULFATE 2 MG/ML IJ SOLN
2.0000 mg | INTRAMUSCULAR | Status: DC | PRN
  Administered 2015-03-31: 2 mg via INTRAVENOUS
  Filled 2015-03-31: qty 1

## 2015-03-31 MED ORDER — FENTANYL CITRATE (PF) 100 MCG/2ML IJ SOLN
INTRAMUSCULAR | Status: DC | PRN
  Administered 2015-03-31: 50 ug via INTRAVENOUS

## 2015-03-31 MED ORDER — OXYCODONE HCL 5 MG PO TABS: 5.0000 mg | ORAL_TABLET | Freq: Once | ORAL | Status: DC | PRN

## 2015-03-31 MED ORDER — FENTANYL CITRATE (PF) 100 MCG/2ML IJ SOLN
25.0000 ug | INTRAMUSCULAR | Status: DC | PRN
  Administered 2015-03-31: 100 ug via INTRAVENOUS

## 2015-03-31 MED ORDER — 0.9 % SODIUM CHLORIDE (POUR BTL) OPTIME
TOPICAL | Status: DC | PRN
  Administered 2015-03-31: 1000 mL

## 2015-03-31 MED ORDER — MIDAZOLAM HCL 5 MG/5ML IJ SOLN
INTRAMUSCULAR | Status: DC | PRN
  Administered 2015-03-31: 2 mg via INTRAVENOUS

## 2015-03-31 MED ORDER — PROPOFOL 10 MG/ML IV BOLUS
INTRAVENOUS | Status: AC
  Filled 2015-03-31: qty 20

## 2015-03-31 MED ORDER — FENTANYL CITRATE (PF) 250 MCG/5ML IJ SOLN
INTRAMUSCULAR | Status: AC
  Filled 2015-03-31: qty 5

## 2015-03-31 MED ORDER — FENTANYL CITRATE (PF) 100 MCG/2ML IJ SOLN
INTRAMUSCULAR | Status: AC
  Filled 2015-03-31: qty 2

## 2015-03-31 MED ORDER — CEFAZOLIN SODIUM-DEXTROSE 2-3 GM-% IV SOLR
INTRAVENOUS | Status: AC
  Filled 2015-03-31: qty 50

## 2015-03-31 MED ORDER — NEOSTIGMINE METHYLSULFATE 10 MG/10ML IV SOLN
INTRAVENOUS | Status: AC
  Filled 2015-03-31: qty 1

## 2015-03-31 MED ORDER — CEFAZOLIN SODIUM 1-5 GM-% IV SOLN
INTRAVENOUS | Status: DC | PRN
  Administered 2015-03-31: 2 g via INTRAVENOUS

## 2015-03-31 MED ORDER — GLYCOPYRROLATE 0.2 MG/ML IJ SOLN
INTRAMUSCULAR | Status: DC | PRN
  Administered 2015-03-31: .7 mg via INTRAVENOUS

## 2015-03-31 MED ORDER — LIDOCAINE HCL (CARDIAC) 20 MG/ML IV SOLN
INTRAVENOUS | Status: DC | PRN
  Administered 2015-03-31: 50 mg via INTRAVENOUS

## 2015-03-31 MED ORDER — PHENYLEPHRINE HCL 10 MG/ML IJ SOLN
INTRAMUSCULAR | Status: DC | PRN
  Administered 2015-03-31 (×2): 80 ug via INTRAVENOUS

## 2015-03-31 MED ORDER — LIDOCAINE HCL (CARDIAC) 20 MG/ML IV SOLN
INTRAVENOUS | Status: AC
  Filled 2015-03-31: qty 5

## 2015-03-31 MED ORDER — ROCURONIUM BROMIDE 50 MG/5ML IV SOLN
INTRAVENOUS | Status: AC
  Filled 2015-03-31: qty 1

## 2015-03-31 MED ORDER — PROPOFOL 10 MG/ML IV BOLUS
INTRAVENOUS | Status: DC | PRN
  Administered 2015-03-31: 80 mg via INTRAVENOUS

## 2015-03-31 SURGICAL SUPPLY — 37 items
BLADE SURG ROTATE 9660 (MISCELLANEOUS) IMPLANT
CANISTER SUCTION 2500CC (MISCELLANEOUS) ×3 IMPLANT
CATH ROBINSON RED A/P 16FR (CATHETERS) IMPLANT
CATH ROBINSON RED A/P 18FR (CATHETERS) IMPLANT
CATH ROBINSON RED A/P 20FR (CATHETERS) IMPLANT
CHLORAPREP W/TINT 26ML (MISCELLANEOUS) ×3 IMPLANT
DRAPE LAPAROSCOPIC ABDOMINAL (DRAPES) ×3 IMPLANT
DRAPE WARM FLUID 44X44 (DRAPE) ×3 IMPLANT
DRSG OPSITE POSTOP 4X6 (GAUZE/BANDAGES/DRESSINGS) ×3 IMPLANT
ELECT CAUTERY BLADE 6.4 (BLADE) ×3 IMPLANT
ELECT REM PT RETURN 9FT ADLT (ELECTROSURGICAL) ×3
ELECTRODE REM PT RTRN 9FT ADLT (ELECTROSURGICAL) ×1 IMPLANT
GAUZE SPONGE 4X4 12PLY STRL (GAUZE/BANDAGES/DRESSINGS) ×3 IMPLANT
GLOVE BIO SURGEON STRL SZ 6.5 (GLOVE) ×2 IMPLANT
GLOVE BIO SURGEON STRL SZ7 (GLOVE) ×6 IMPLANT
GLOVE BIO SURGEONS STRL SZ 6.5 (GLOVE) ×1
GLOVE BIOGEL PI IND STRL 6.5 (GLOVE) ×2 IMPLANT
GLOVE BIOGEL PI IND STRL 7.0 (GLOVE) ×1 IMPLANT
GLOVE BIOGEL PI IND STRL 7.5 (GLOVE) ×1 IMPLANT
GLOVE BIOGEL PI INDICATOR 6.5 (GLOVE) ×4
GLOVE BIOGEL PI INDICATOR 7.0 (GLOVE) ×2
GLOVE BIOGEL PI INDICATOR 7.5 (GLOVE) ×2
GLOVE SURG SS PI 7.0 STRL IVOR (GLOVE) ×3 IMPLANT
GOWN STRL REUS W/ TWL LRG LVL3 (GOWN DISPOSABLE) ×2 IMPLANT
GOWN STRL REUS W/TWL LRG LVL3 (GOWN DISPOSABLE) ×4
KIT BASIN OR (CUSTOM PROCEDURE TRAY) ×3 IMPLANT
KIT ROOM TURNOVER OR (KITS) ×3 IMPLANT
NS IRRIG 1000ML POUR BTL (IV SOLUTION) ×6 IMPLANT
PACK GENERAL/GYN (CUSTOM PROCEDURE TRAY) ×3 IMPLANT
PAD ARMBOARD 7.5X6 YLW CONV (MISCELLANEOUS) ×3 IMPLANT
SUT ETHILON 2 0 FS 18 (SUTURE) ×3 IMPLANT
SUT SILK 2 0 SH CR/8 (SUTURE) ×3 IMPLANT
TAPE CLOTH SURG 6X10 WHT LF (GAUZE/BANDAGES/DRESSINGS) ×3 IMPLANT
TOWEL OR 17X24 6PK STRL BLUE (TOWEL DISPOSABLE) ×3 IMPLANT
TOWEL OR 17X26 10 PK STRL BLUE (TOWEL DISPOSABLE) ×3 IMPLANT
TRAY FOLEY CATH 16FRSI W/METER (SET/KITS/TRAYS/PACK) ×3 IMPLANT
TUBE GASTROSTOMY 18F (CATHETERS) ×3 IMPLANT

## 2015-03-31 NOTE — Op Note (Signed)
Preoperative diagnosis: head/neck cancer, need for feeding access Postoperative diagnosis: same as above Procedure: open 18 Fr Mic Stamm gastrostomy tube placement Surgeon: Dr. Serita Grammes Anesthesia: Gen. Estimated blood loss: Minimal Complications: None Drains: None Sponge and needle count was correct at completion Disposition to recovery stable  Indications: This is a 54 year old male with a newly diagnosed head and neck cancer. He is unable to have either a PEG or an IR placed G-tube. We were consulted for feeding access and I discussed with him an open gastrostomy tube placement.  Procedure: After informed consent was obtained the patient was taken to the operating room. He was given antibiotics. Sequential compression devices were on his legs. He was placed under general anesthesia without complication. His abdomen was prepped and draped in the standard sterile surgical fashion. Surgical timeout was then performed.  I made an upper midline incision. I entered into the peritoneum without difficulty. His stomach was identified. I identified a place in his left upper quadrant and I entered into the peritoneum. I then placed 2 separate 2-0 silk pursestring sutures to his stomach. I then inserted the tube through the hole in the abdominal wall. I made a gastrotomy. I inserted the tube. I tied down my pursestring sutures. I then performed a Stamm gastrostomy with 2-0 silk suture to secure the stomach to the abdominal wall. I then blew up the balloon. This all appeared to be in good position. I then obtained hemostasis. I sewed the button to the G-tube down with nylon suture. I then closed with #1 PDS. The skin was stapled. A dressing was placed. He tolerated this well and was transferred to recovery. Will leave tube to gravity drainage overnight and then can begin using tomorrow

## 2015-03-31 NOTE — Progress Notes (Signed)
SLP Cancellation Note  Patient Details Name: Kyle Alexander MRN: [id number] DOB: 1961/10/06   Cancelled treatment:       Reason Eval/Treat Not Completed: Fatigue/lethargy limiting ability to participate;Pain limiting ability to participate. Pt is s/p g-tube placement earlier today, currently with reports of pain, lethargy, and nausea. RN present and administering IV dilaudid. Pt shakes his head "no" when offered PMV trials to better communicate with staff. Will return tomorrow for additional PMV and PO trials. Per RN, plan is for trach change today.   Germain Osgood, M.A. CCC-SLP [phone]  Germain Osgood 03/31/2015, 2:53 PM

## 2015-03-31 NOTE — Transfer of Care (Signed)
Immediate Anesthesia Transfer of Care Note  Patient: Kyle Alexander  Procedure(s) Performed: Procedure(s): OPEN GASTROSTOMY TUBE PLACEMENT (N/A)  Patient Location: PACU  Anesthesia Type:General  Level of Consciousness: awake, alert  and oriented  Airway & Oxygen Therapy: Patient Spontanous Breathing and Patient connected to T-piece oxygen  Post-op Assessment: Report given to RN, Post -op Vital signs reviewed and stable and Patient moving all extremities X 4  Post vital signs: Reviewed and stable  Last Vitals:  Filed Vitals:   03/31/15 0520  BP: 137/82  Pulse: 93  Temp: 37.2 C  Resp: 17    Complications: No apparent anesthesia complications

## 2015-03-31 NOTE — Progress Notes (Signed)
RT Note: Pt was in OR during this time & patient assessment was not done. RT will continue to monitor when patient arrives back on unit.

## 2015-03-31 NOTE — H&P (View-Only) (Signed)
[facility] Surgery Consult Note  RAMELO OETKEN [birth year]  [id number].    Requesting MD: Dr. Waymon Budge Chief Complaint/Reason for Consult: Need for feeding tube placement  HPI:  54 y/o AA male with history of HTN, tobacco and alcohol abuse who presented with complaint of an enlarging neck mass, progressive dysphagia, and weight loss for the past several months. Patient was previously seen in urgent care on 07/21/2014 with complaint of progressive discomfort with swallowing and swollen cervical lymph nodes; although further workup was advised then, the patient left and did not return for evaluation. He reports dysphagia to solids, but says he is able to get liquids down. He does not have a primary care physician.  CT scan showed an advanced hypopharyngeal/supraglottic mass, largely with a superficial spreading pattern, as well as massive bilateral necrotic metastatic lymphadenopathy on 03/15/15. The patient underwent cervical lymph node biopsy in IR on 03/16/15 including surgical path and cytology and was ultimately diagnosed with T4N2C Squamous Cell Carcinoma of Oropharynx, Hypopharynx, and Larynx.  He was discharged home on 03/17/15 for OP follow up.  He was re-admitted pending Trach/PEG and dental extractions.  Dr. [name] (ENT) was consulted and he recommends medical oncology and radiation oncology consults.  He is now s/p tracheostomy (Dr. [name]) and multiple dental extractions (Dr. Enrique Sack) on 03/26/15.   IR attempted push method of g-tube placement (03/30/15), but was unsuccessful secondary to the stomach being too high and not safely accessible percutaneously.  We were consulted regarding proceeding with surgical g-tube placement due to dysphagia, PCM, and need for feeding access.   ROS: All systems reviewed and otherwise negative except for as above  Family History  Problem Relation Age of Onset  . Cancer Mother   . Diabetes Mother   . Diabetes Father   . Hypertension Father      Past Medical History  Diagnosis Date  . Hypertension   . Cancer 03/16/15    left cervical lymph node  . Shortness of breath dyspnea   . GERD (gastroesophageal reflux disease)   . Seizures     Past Surgical History  Procedure Laterality Date  . Knee surgery  1998    Left  . Tonsillectomy and adenoidectomy    . Lymph node biopsy Left 03/16/15    left cervical, consistent with squamous cell carcinoma  . Hernia repair      right groin  . Tonsillectomy    . Tracheostomy tube placement N/A 03/26/2015    Procedure: TRACHEOSTOMY ;  Surgeon: [name], MD;  Location: [place];  Service: ENT;  Laterality: N/A;  . Tooth extraction  03/26/2015    Procedure: [place];  Surgeon: [name], MD;  Location: [place];  Service: ENT;;  . Multiple extractions with alveoloplasty N/A 03/26/2015    Procedure: EXTRACTION OF TOOTH #'S 1,2,5,6,7,8,9,10,11,12,13,18,19,21,22,23,24,25,26,27,28,29  WITH AVELOPLASTY;  Surgeon: Lenn Cal, DDS;  Location: [place];  Service: Oral Surgery;  Laterality: N/A;    Social History:  reports that he has been smoking.  He has quit using smokeless tobacco. His smokeless tobacco use included Chew. He reports that he drinks about 18.0 oz of alcohol per week. He reports that he uses illicit drugs (Marijuana).  Allergies:  Allergies  Allergen Reactions  . Aspirin     Makes me bleed    Medications Prior to Admission  Medication Sig Dispense Refill  . feeding supplement, RESOURCE BREEZE, (RESOURCE BREEZE) LIQD Take 1 Container by mouth 3 (three) times daily between meals.  0  .  ibuprofen (ADVIL,MOTRIN) 200 MG tablet Take 200 mg by mouth every 6 (six) hours as needed for mild pain.    . ergocalciferol (DRISDOL) 8000 UNIT/ML drops Take 6.3 mLs (50,000 Units total) by mouth once a week. (Patient not taking: Reported on 03/20/2015) 60 mL 7    Blood pressure 130/76, pulse 84, temperature 98.5 F (36.9 C), temperature source Oral, resp. rate 16, height 5' 10.5" (1.791 m),  weight 61.3 kg (135 lb 2.3 oz), SpO2 98 %. Physical Exam: General: pleasant, WD/WN AA male who is laying in bed in NAD, coughing with trach in place HEENT: Trach in place, thick secretions noted, head is atraumatic.  Sclera are noninjected.  PERRL.  Ears and nose without any masses or lesions.  Mouth is pink and moist Heart: regular, rate, and rhythm.  No obvious murmurs, gallops, or rubs noted.  Palpable pedal pulses bilaterally Lungs: CTAB, no wheezes, rhonchi, or rales noted.  Respiratory effort nonlabored Abd: soft, NT/ND, +BS, no masses, hernias, or organomegaly, no abdominal scars noted, states he had hernia repair to right groin - feel a cyst in right groin, but do not see a scar MS: all 4 extremities are symmetrical with no cyanosis, clubbing, or edema. Skin: warm and dry with no masses, lesions, or rashes Psych: A&Ox3 with an appropriate affect.   Results for orders placed or performed during the hospital encounter of 03/26/15 (from the past 48 hour(s))  CBC     Status: Abnormal   Collection Time: 03/29/15  4:10 AM  Result Value Ref Range   WBC 10.4 4.0 - 10.5 K/uL   RBC 3.14 (L) 4.22 - 5.81 MIL/uL   Hemoglobin 10.2 (L) 13.0 - 17.0 g/dL   HCT 30.1 (L) 39.0 - 52.0 %   MCV 95.9 78.0 - 100.0 fL   MCH 32.5 26.0 - 34.0 pg   MCHC 33.9 30.0 - 36.0 g/dL   RDW 14.7 11.5 - 15.5 %   Platelets 176 150 - 400 K/uL  Magnesium     Status: Abnormal   Collection Time: 03/29/15  4:10 AM  Result Value Ref Range   Magnesium 1.4 (L) 1.5 - 2.5 mg/dL  Protime-INR     Status: None   Collection Time: 03/30/15  3:45 AM  Result Value Ref Range   Prothrombin Time 14.7 11.6 - 15.2 seconds   INR 1.14 0.00 - 1.49  CBC     Status: Abnormal   Collection Time: 03/30/15  3:45 AM  Result Value Ref Range   WBC 8.4 4.0 - 10.5 K/uL   RBC 2.97 (L) 4.22 - 5.81 MIL/uL   Hemoglobin 9.7 (L) 13.0 - 17.0 g/dL   HCT 28.7 (L) 39.0 - 52.0 %   MCV 96.6 78.0 - 100.0 fL   MCH 32.7 26.0 - 34.0 pg   MCHC 33.8 30.0 -  36.0 g/dL   RDW 14.7 11.5 - 15.5 %   Platelets 185 150 - 400 K/uL  Magnesium     Status: Abnormal   Collection Time: 03/30/15  3:45 AM  Result Value Ref Range   Magnesium 1.4 (L) 1.5 - 2.5 mg/dL  Basic metabolic panel     Status: Abnormal   Collection Time: 03/30/15  3:45 AM  Result Value Ref Range   Sodium 135 135 - 145 mmol/L   Potassium 3.1 (L) 3.5 - 5.1 mmol/L   Chloride 96 96 - 112 mmol/L   CO2 31 19 - 32 mmol/L   Glucose, Bld 107 (H) 70 - 99 mg/dL     BUN <5 (L) 6 - 23 mg/dL   Creatinine, Ser 0.51 0.50 - 1.35 mg/dL   Calcium 9.0 8.4 - 10.5 mg/dL   GFR calc non Af Amer >90 >90 mL/min   GFR calc Af Amer >90 >90 mL/min    Comment: (NOTE) The eGFR has been calculated using the CKD EPI equation. This calculation has not been validated in all clinical situations. eGFR's persistently <90 mL/min signify possible Chronic Kidney Disease.    Anion gap 8 5 - 15   Ir Fluoro Rm 30-60 Min  03/30/2015   CLINICAL DATA:  Advanced head and neck malignancy, malnutrition  EXAM: FLUOROSCOPIC EXAM OF THE STOMACH. (UNABLE TO PLACE PERCUTANEOUS GASTROSTOMY)  Date:  4/18/20164/18/2016 2:31 pm  Radiologist:  M. Trevor Shick, MD  Guidance:  Fluoroscopic  FLUOROSCOPY TIME:  1 minutes 18 seconds, 9 mGy  MEDICATIONS AND MEDICAL HISTORY: 1 mg Versed, 50 mcg fentanyl  ANESTHESIA/SEDATION: 15 minutes  CONTRAST:  10mL OMNIPAQUE IOHEXOL 300 MG/ML  SOLN  COMPLICATIONS: None immediate  PROCEDURE: Informed consent was obtained from the patient following explanation of the procedure, risks, benefits and alternatives. The patient understands, agrees and consents for the procedure. All questions were addressed. A time out was performed.  Initially, a 5 French catheter was advanced through the oral cavity into the stomach under fluoroscopy. This was performed after conscious sedation. This was utilized to insufflate the stomach with air. Stomach was noted to be high in the abdomen beneath the left subcostal margin despite  adequate insufflation of the stomach with air. The stomach does not extend inferiorly below the subcostal margin to allow percutaneous needle access safely. Therefore the procedure cannot be performed with percutaneous fluoroscopic technique.  IMPRESSION: High positioned stomach beneath the left subcostal margin, not accessible safely by percutaneous fluoroscopic technique without risking bowel injury.  PLAN: Findings discussed with the patient's medical staff. Recommend surgical consultation for gastrostomy placement.  COMPARISON:  03/16/2015   Electronically Signed   By: M.  Shick M.D.   On: 03/30/2015 14:54      Assessment/Plan T4N2C Squamous Cell Carcinoma of Oropharynx, Hypopharynx, and Larynx s/p tracheostomy and multiple dental extractions on 4/14  Need for feeding tube/Dysphagia/malnutrition Tobacco abuse Alcohol abuse Marijuana abuse HTN Thoracic Aortic Dilation - 4 cm ascending aortic dilation seen on CTA chest.  Plan: 1.  Plan for laparotomy with G-tube placement for feeding support tomorrow 2.  He is okay to eat until midnight then NPO, I would have primary team check with SLP to make sure he's able to advance his diet because he has not tried the passey muir valve yet. 3.  Will be able to start tube feedings starting 24 hours after procedure. 4.  Dr. Wakefield to see   DORT, , PA-C [facility] 03/30/2015, 3:16 PM Pager: [phone]  

## 2015-03-31 NOTE — Anesthesia Procedure Notes (Signed)
Date/Time: 03/31/2015 9:05 AM Performed by: Neldon Newport Pre-anesthesia Checklist: Patient being monitored, Suction available, Emergency Drugs available, Patient identified and Timeout performed Patient Re-evaluated:Patient Re-evaluated prior to inductionOxygen Delivery Method: Circle system utilized Preoxygenation: Pre-oxygenation with 100% oxygen Intubation Type: IV induction Laryngoscope size: Existing tracheostomy used.  Cuff inflated with 6 ml air. Airway Equipment and Method: Tracheostomy Placement Confirmation: positive ETCO2 and breath sounds checked- equal and bilateral

## 2015-03-31 NOTE — Progress Notes (Addendum)
Subjective:  No acute events overnight. IR was unable to place g-tube and is to have it done by general surgery today. Speech therapy recommended NPO with ice chips as needed after oral care.    Objective: Vital signs in last 24 hours: Filed Vitals:   03/30/15 2114 03/30/15 2322 03/31/15 0358 03/31/15 0520  BP: 131/78   137/82  Pulse: 84 92 87 93  Temp: 98.7 F (37.1 C)   98.9 F (37.2 C)  TempSrc: Oral   Oral  Resp: 18 18 18 17   Height:      Weight:      SpO2: 100% 97% 100% 97%   Weight change:   Intake/Output Summary (Last 24 hours) at 03/31/15 0743 Last data filed at 03/31/15 0488  Gross per 24 hour  Intake 911.25 ml  Output    950 ml  Net -38.75 ml   PHYSICAL EXAMINATION: General: NAD, cachectic appearing  HEENT:  Edentulous. Bilateral cervical lymphadenopathy.  Lungs: Trach collar in place. Clear to ausculation bilaterally. No wheezing, rhonchi, or rales.  Heart: Normal rate and rhythm  Abdomen: Soft, non-distended, non-tender. Normal BS. Extremities: No edema Neuro: A& O x 3.    Lab Results: Basic Metabolic Panel:  Recent Labs Lab 03/27/15 0256  03/29/15 0410 03/30/15 0345 03/31/15 0530  NA 136  < >  --  135 135  K 4.6  < >  --  3.1* 3.3*  CL 96  < >  --  96 95*  CO2 27  < >  --  31 29  GLUCOSE 83  < >  --  107* 77  BUN 7  < >  --  <5* <5*  CREATININE 0.91  < >  --  0.51 0.52  CALCIUM 9.6  < >  --  9.0 8.7  MG 1.3*  < > 1.4* 1.4*  --   PHOS 3.1  --   --   --   --   < > = values in this interval not displayed. Liver Function Tests:  Recent Labs Lab 03/27/15 0256  AST 17  ALT 9  ALKPHOS 59  BILITOT 1.2  PROT 6.4  ALBUMIN 2.4*   CBC:  Recent Labs Lab 03/27/15 0256  03/30/15 0345 03/31/15 0530  WBC 14.2*  < > 8.4 9.3  NEUTROABS 11.9*  --   --   --   HGB 10.7*  < > 9.7* 9.7*  HCT 32.0*  < > 28.7* 28.6*  MCV 97.6  < > 96.6 96.3  PLT 257  < > 185  189  < > = values in this interval not displayed. CBG:  Recent Labs Lab 03/26/15 1437  GLUCAP 114*   Urine Drug Screen: Drugs of Abuse     Component Value Date/Time   LABOPIA NONE DETECTED 03/15/2015 1533   COCAINSCRNUR NONE DETECTED 03/15/2015 1533   LABBENZ NONE DETECTED 03/15/2015 1533   AMPHETMU NONE DETECTED 03/15/2015 1533   THCU POSITIVE* 03/15/2015 1533   LABBARB NONE DETECTED 03/15/2015 1533      Studies/Results: Ir  Fluoro Rm 30-60 Min  03/30/2015   CLINICAL DATA:  Advanced head and neck malignancy, malnutrition  EXAM: FLUOROSCOPIC EXAM OF THE STOMACH. (UNABLE TO PLACE PERCUTANEOUS GASTROSTOMY)  Date:  4/18/20164/18/2016 2:31 pm  Radiologist:  M. Daryll Brod, MD  Guidance:  Fluoroscopic  FLUOROSCOPY TIME:  1 minutes 18 seconds, 9 mGy  MEDICATIONS AND MEDICAL HISTORY: 1 mg Versed, 50 mcg fentanyl  ANESTHESIA/SEDATION: 15 minutes  CONTRAST:  54mL OMNIPAQUE IOHEXOL 300 MG/ML  SOLN  COMPLICATIONS: None immediate  PROCEDURE: Informed consent was obtained from the patient following explanation of the procedure, risks, benefits and alternatives. The patient understands, agrees and consents for the procedure. All questions were addressed. A time out was performed.  Initially, a 5 French catheter was advanced through the oral cavity into the stomach under fluoroscopy. This was performed after conscious sedation. This was utilized to insufflate the stomach with air. Stomach was noted to be high in the abdomen beneath the left subcostal margin despite adequate insufflation of the stomach with air. The stomach does not extend inferiorly below the subcostal margin to allow percutaneous needle access safely. Therefore the procedure cannot be performed with percutaneous fluoroscopic technique.  IMPRESSION: High positioned stomach beneath the left subcostal margin, not accessible safely by percutaneous fluoroscopic technique without risking bowel injury.  PLAN: Findings discussed with the patient's  medical staff. Recommend surgical consultation for gastrostomy placement.  COMPARISON:  03/16/2015   Electronically Signed   By: Jerilynn Mages.  Shick M.D.   On: 03/30/2015 14:54   Medications: I have reviewed the patient's current medications. Scheduled Meds: . chlorhexidine  15 mL Mouth/Throat BID  . enoxaparin (LOVENOX) injection  40 mg Subcutaneous Q24H  . ergocalciferol  50,000 Units Oral Weekly  . feeding supplement (RESOURCE BREEZE)  1 Container Oral TID BM  . folic acid  1 mg Intravenous Daily  . multivitamin  5 mL Oral Daily  . nicotine  21 mg Transdermal Daily  . thiamine  100 mg Oral Daily   Or  . thiamine  100 mg Intravenous Daily   Continuous Infusions: . lactated ringers 75 mL/hr at 03/30/15 2132  . lactated ringers Stopped (03/28/15 1914)  . sodium chloride irrigation     PRN Meds:.HYDROmorphone (DILAUDID) injection, LORazepam **OR** LORazepam, morphine injection, sodium chloride Assessment/Plan:  T4N2C Squamous Cell Carcinoma of Oropharynx, Hypopharynx, and Larynx s/p tracheostomy and multiple dental extractions on 4/14 -  Currently with 97% SpO2 on trach collar with 5L O2 flow. Pt with moderately controlled pain. Prealbumin 11 on 4/3.  -Appreciate ENT, dentistry, and IR recommendations -Plan for direct push approach g-tube placement today by general surgery -Tracheostomy and oral care per shift, per ENT downsize to cuffless tube  -PET scan scheduled for April 22 -Continue IV morphine 2 mg Q 2 hr and IV dilaudid 1 mg Q 4 hr PRN -Clear diet, will advance as tolerated, continue LR 75 mL/hr until adequate PO intake -Chlorhexidine rinses BID and salt rinses Q 2 hr in between rinses  -Suture removal in 2-4 days (self-dissolvable) -Appreciate dietician consult, continue resource breeze 1 can TID   -Appreciate SW consult and PT & OT consults, recommend SNF, will need to find out if pt amenable  -SLP evaluation again today, to start working with Sharen Heck valve per ENT    Hypokalemia - K 3.3 this this AM in setting of hypomagnesemia and no dietary intake.   -Administer IV potassium chloride 10 mEq for 3 hrs  -Monitor BMP and Mg level  History of Hypertension -  Currently normotensive. Pt not on antihypertensives at home. -Consider adding amlodipine 5 mg daily if uncontrolled  Acute Blood Loss Anemia - Hg 9.7 from 13.9 on admission with no active bleeding or hemodynamic instability. Pt with no prior history of anemia with 100 mL EBL from recent dental extraction. -Continue to monitor CBC -Monitor for bleeding and transfuse if <7 -Obtain FOBT  Tobacco Abuse - Pt with 1-2 pack history for approx past 30 years.  -Continue nicotine 21 mg patch daily  -Pt counseled on tobacco cessation   Alcohol Abuse - Date of last drink unclear. History of remote alcohol-withdrawal seizures.   -Continue CIWA protocol  -Counsel on cessation  Vitamin D Deficiency  - Level < 4 on 03/15/15. -Continue liquid ergocalciferol 50 K U weekly (Week 2/8)  Folate Deficiency - Folate level low at 3.2 on 03/15/15.  -Continue IV folate 1 mg daily   Marijuana Abuse - Pt reports smoking THC 1-2 times weekly.  -Pt counseled on cessation   Thoracic Aortic Dilation - 4 cm ascending aortic dilation seen on CTA chest. -Needs follow-up CTA or MRA in 1 year  Prolonged QT - EKG on 03/15/15 with QTc of 470 in setting of hypomagnesemia and chronic alcohol abuse.  -Avoid prolonging QT medications  -Replete magnesium   Diet: Clears  DVT Ppx: Lovenox, SCD's Code: Full   Dispo: Disposition is deferred at this time, awaiting improvement of current medical problems.  Anticipated discharge in approximately 3 day(s).   The patient does have a current PCP (Pcp Not In System) and does need an [facility] hospital follow-up appointment after discharge.  The patient does have transportation limitations that hinder transportation to clinic appointments.  .Services Needed at time of discharge: Y =  Yes, Blank = No PT:   OT:   RN:   Equipment:   Other:     LOS: 5 days   Juluis Mire, MD 03/31/2015, 7:43 AM

## 2015-03-31 NOTE — Progress Notes (Signed)
Patient ID: Kyle Alexander, male   DOB: 01-18-1961, 54 y.o.   MRN: [id number] Medicine attending: I, and subcutaneous I examined this patient and discussed management with the medical team. He had a gastrostomy feeding tube placed by general surgery today. Blood pressure 119/74, pulse 85, temperature 97.9 F (36.6 C), temperature source Oral, resp. rate 16, height 5' 10.5" (1.791 m), weight 135 lb 2.3 oz (61.3 kg), SpO2 96 %. He is awake. Some bloody saturation on the wound dressing. Lungs overall clear with some scattered rhonchi at the bases. Regular cardiac rhythm. Muscle wasting of the extremities. Impression: Locally advanced squamous cell carcinoma of the oropharynx with bilateral metastatic neck adenopathy now status post prophylactic tracheotomy to protect his airway, multiple dental extractions in anticipation of radiation therapy, and now a feeding tube in view of severe malnutrition and need for enteral access other than his oropharynx as he goes to radiation. His sister is here and we had a nice discussion about his status and future plan. She lives in [place]. She did Arts administrator for a pharmaceutical company for 30 years and is now a Pharmacist, hospital at [employer] in [place]. She appears to be very caring person committed to doing all she can for her brother. They have one other younger brother. We will now move towards finding appropriate ECF for him once his abdominal wound heals and we can begin alimentation. Begin radiation when otherwise stable as an outpatient.  Murriel Hopper, MD, [place]  Hematology-Oncology/Internal Medicine

## 2015-03-31 NOTE — Anesthesia Preprocedure Evaluation (Addendum)
Anesthesia Evaluation  Patient identified by MRN, date of birth, ID band Patient awake    Reviewed: Allergy & Precautions, NPO status , Patient's Chart, lab work & pertinent test results  Airway Mallampati: Trach       Dental   Pulmonary shortness of breath, Current Smoker,  breath sounds clear to auscultation        Cardiovascular hypertension, On Medications + Peripheral Vascular Disease Rhythm:regular Rate:Normal     Neuro/Psych Seizures -,     GI/Hepatic GERD-  ,  Endo/Other    Renal/GU      Musculoskeletal   Abdominal   Peds  Hematology   Anesthesia Other Findings Tracheostomy in place  Reproductive/Obstetrics                            Anesthesia Physical Anesthesia Plan  ASA: II  Anesthesia Plan: General   Post-op Pain Management:    Induction: Intravenous  Airway Management Planned: Tracheostomy  Additional Equipment:   Intra-op Plan:   Post-operative Plan: Extubation in OR  Informed Consent: I have reviewed the patients History and Physical, chart, labs and discussed the procedure including the risks, benefits and alternatives for the proposed anesthesia with the patient or authorized representative who has indicated his/her understanding and acceptance.     Plan Discussed with: CRNA, Anesthesiologist and Surgeon  Anesthesia Plan Comments:         Anesthesia Quick Evaluation

## 2015-03-31 NOTE — Progress Notes (Signed)
PT Cancellation Note  Patient Details Name: Kyle Alexander MRN: [id number] DOB: [birth year]   Cancelled Treatment:    Reason Eval/Treat Not Completed: Patient at procedure or test/unavailable (pt having surgery this morning).  Will follow.    Blondell Reveal Kistler 03/31/2015, 10:34 AM [phone]

## 2015-03-31 NOTE — Progress Notes (Signed)
UR completed 

## 2015-03-31 NOTE — Clinical Social Work Note (Addendum)
CSW received consult for patient to discharge to SNF.  CSW attempted to see patient, but patient was not in his room due to procedure of getting peg tube.  CSW will try to see patient later today or first thing tomorrow.  Jones Broom. [place], MSW, [place] 03/31/2015 3:27 PM

## 2015-03-31 NOTE — Interval H&P Note (Signed)
History and Physical Interval Note:  03/31/2015 8:34 AM  Kyle Alexander  has presented today for surgery, with the diagnosis of need for feeding tube  The various methods of treatment have been discussed with the patient and family. After consideration of risks, benefits and other options for treatment, the patient has consented to  Procedure(s): OPEN GASTROSTOMY TUBE PLACEMENT (N/A) as a surgical intervention .  The patient's history has been reviewed, patient examined, no change in status, stable for surgery.  I have reviewed the patient's chart and labs.  Questions were answered to the patient's satisfaction.     Lameshia Hypolite

## 2015-03-31 NOTE — Anesthesia Postprocedure Evaluation (Signed)
Anesthesia Post Note  Patient: Kyle Alexander  Procedure(s) Performed: Procedure(s) (LRB): OPEN GASTROSTOMY TUBE PLACEMENT (N/A)  Anesthesia type: General  Patient location: PACU  Post pain: Pain level controlled and Adequate analgesia  Post assessment: Post-op Vital signs reviewed, Patient's Cardiovascular Status Stable, Respiratory Function Stable, Patent Airway and Pain level controlled  Last Vitals:  Filed Vitals:   03/31/15 1006  BP: 119/74  Pulse:   Temp: 36.6 C  Resp:     Post vital signs: Reviewed and stable  Level of consciousness: awake, alert  and oriented  Complications: No apparent anesthesia complications

## 2015-03-31 NOTE — Progress Notes (Signed)
OT Cancellation Note  Patient Details Name: Kyle Alexander MRN: [id number] DOB: 14-Sep-1961   Cancelled Treatment:    Reason Eval/Treat Not Completed: Medical issues which prohibited therapy.  Had PEG placed earlier.  Will hold OT today and check back tomorrow as schedule allows.   Darlina Rumpf [place], OTR/L [phone]   03/31/2015, 1:00 PM

## 2015-03-31 NOTE — Progress Notes (Signed)
Subjective: No acute events overnight. Patient complaining of head, mouth, neck and throat pain but seems to be managed using current pain regimen. Per sister, patient is bothered by increased volume and thickening of secretions, requiring frequent suctioning.   Objective: Vital signs in last 24 hours: Filed Vitals:   03/30/15 2114 03/30/15 2322 03/31/15 0358 03/31/15 0520  BP: 131/78   137/82  Pulse: 84 92 87 93  Temp: 98.7 F (37.1 C)   98.9 F (37.2 C)  TempSrc: Oral   Oral  Resp: 18 18 18 17   Height:      Weight:      SpO2: 100% 97% 100% 97%   Weight change:   Intake/Output Summary (Last 24 hours) at 03/31/15 0738 Last data filed at 03/31/15 5701  Gross per 24 hour  Intake 911.25 ml  Output    950 ml  Net -38.75 ml   General: resting in bed, some increased work of breathing with trach, leaning forward. HEENT: PERRL, EOMI, no scleral icterus Cardiac: RRR, no rubs, murmurs or gallops Pulm: poor inspiratory effort in setting of tracheostomy, upper lobes CTAB Abd: soft, nontender, nondistended, BS present Ext: thin, warm and well perfused, no pedal edema Neuro: alert and oriented X3, nonverbal with trach in place  Lab Results: Basic Metabolic Panel:  Recent Labs  03/29/15 0410 03/30/15 0345 03/31/15 0530  NA  --  135 135  K  --  3.1* 3.3*  CL  --  96 95*  CO2  --  31 29  GLUCOSE  --  107* 77  BUN  --  <5* <5*  CREATININE  --  0.51 0.52  CALCIUM  --  9.0 8.7  MG 1.4* 1.4*  --    CBC:  Recent Labs  03/30/15 0345 03/31/15 0530  WBC 8.4 9.3  HGB 9.7* 9.7*  HCT 28.7* 28.6*  MCV 96.6 96.3  PLT 185 189   Coagulation:  Recent Labs  03/30/15 0345  LABPROT 14.7  INR 1.14   Urine Drug Screen: Drugs of Abuse     Component Value Date/Time   LABOPIA NONE DETECTED 03/15/2015 1533   COCAINSCRNUR NONE DETECTED 03/15/2015 1533   LABBENZ NONE DETECTED 03/15/2015 1533   AMPHETMU NONE DETECTED 03/15/2015 1533   THCU POSITIVE* 03/15/2015 1533   LABBARB  NONE DETECTED 03/15/2015 1533    Studies/Results: Ir Fluoro Rm 30-60 Min  03/30/2015   CLINICAL DATA:  Advanced head and neck malignancy, malnutrition  EXAM: FLUOROSCOPIC EXAM OF THE STOMACH. (UNABLE TO PLACE PERCUTANEOUS GASTROSTOMY)  Date:  4/18/20164/18/2016 2:31 pm  Radiologist:  [name], MD  Guidance:  Fluoroscopic  FLUOROSCOPY TIME:  1 minutes 18 seconds, 9 mGy  MEDICATIONS AND MEDICAL HISTORY: 1 mg Versed, 50 mcg fentanyl  ANESTHESIA/SEDATION: 15 minutes  CONTRAST:  57mL OMNIPAQUE IOHEXOL 300 MG/ML  SOLN  COMPLICATIONS: None immediate  PROCEDURE: Informed consent was obtained from the patient following explanation of the procedure, risks, benefits and alternatives. The patient understands, agrees and consents for the procedure. All questions were addressed. A time out was performed.  Initially, a 5 French catheter was advanced through the oral cavity into the stomach under fluoroscopy. This was performed after conscious sedation. This was utilized to insufflate the stomach with air. Stomach was noted to be high in the abdomen beneath the left subcostal margin despite adequate insufflation of the stomach with air. The stomach does not extend inferiorly below the subcostal margin to allow percutaneous needle access safely. Therefore the procedure cannot  be performed with percutaneous fluoroscopic technique.  IMPRESSION: High positioned stomach beneath the left subcostal margin, not accessible safely by percutaneous fluoroscopic technique without risking bowel injury.  PLAN: Findings discussed with the patient's medical staff. Recommend surgical consultation for gastrostomy placement.  COMPARISON:  03/16/2015   Electronically Signed   By: Jerilynn Mages.  Shick M.D.   On: 03/30/2015 14:54   Medications: I have reviewed the patient's current medications. Scheduled Meds: . chlorhexidine  15 mL Mouth/Throat BID  . enoxaparin (LOVENOX) injection  40 mg Subcutaneous Q24H  . ergocalciferol  50,000 Units Oral  Weekly  . feeding supplement (RESOURCE BREEZE)  1 Container Oral TID BM  . folic acid  1 mg Intravenous Daily  . multivitamin  5 mL Oral Daily  . nicotine  21 mg Transdermal Daily  . thiamine  100 mg Oral Daily   Or  . thiamine  100 mg Intravenous Daily   Continuous Infusions: . lactated ringers 75 mL/hr at 03/30/15 2132  . lactated ringers Stopped (03/28/15 1914)  . sodium chloride irrigation     PRN Meds:.HYDROmorphone (DILAUDID) injection, LORazepam **OR** LORazepam, morphine injection, sodium chloride Assessment/Plan: Active Problems:   HTN (hypertension)   Tobacco abuse   Alcohol abuse   Hypokalemia   Marijuana abuse   Folate deficiency   Protein-calorie malnutrition, severe   Vitamin D deficiency   Squamous cell carcinoma of supraglottis   Head and neck cancer  T4N2C Squamous Cell Carcinoma of Oropharynx, Hypopharynx, and Larynx s/p tracheostomy and multiple dental extractions on 4/14 - Currently with 100% SpO2 on trach collar with well-controlled pain. Prealbumin 11 on 4/3. PT/OT evaluated, recommending SNF or maximizing home health including HHPT and RN. IR direct push PEG tube placement unsuccessful 2/2 high-riding stomach.  -Plan for laparotomy g-tube placement today with surgery - nothing in tube for 24-hours after placement. -Appreciate ENT, dentistry, and surgery recommendations - PT/OT rec SNF, can consider if patient amenable -PET scan scheduled for April 22, will need to be OP - IV morphine 2mg  q2hrs PRN (moderate pain) - IV hydromorphone 1mg  q4hrs PRN (severe pain) -Advance diet from clears as tolerated, continue LR 75 mL/hr until adequate PO intake -Chlorhexidine rinses BID and salt rinses Q 2 hr in between rinses  -Suture removal 7-10 days post-op (self-dissolvable)  New tracheostomy with thickened secretions: Patient's family complaining of thick secretions. Tracheostomy downsized to cuffless tube on 4/18. Passey Muir Valve (PMV) placed for ~10 minutes  with low intensity phonation obtained. Concern for decreased ability to manage secretions with weak cough. Patient requested rest. Thickened secretions may be 2/2 inadequately hydrated trach vs dehydration. In setting of NPO, consider increasing LR from 75 to 100 cc/hr.  - consider humidifier for thinning secretions vs scopolamine patch. - continue PMV trials with SLP to assess tolerance prior to prolonged trials - confirm humidifier on tracheostomy to facilitate secretion thinning -Tracheostomy and oral care per shift  Nutrition: Prealbumin 11 on 4/3. - start resource breeze 1 can TID   Hypokalemia: 3.3 on 4/19, down from 4.6 on admission. Not complaining of any weakness or muscle pain.  - replete IV Potassium 44meq/hour x3 hours  Hypomagnesemia - Mg 1.4 this AM in setting of chronic alcohol abuse.  -Administer IV Magnesium sulfate 2 g -Continue to monitor Mg level  Hypertension - Currently normotensive. Pt not on antihypertensives at home. -Consider adding amlodipine 5 mg daily if uncontrolled  Acute Blood Loss Anemia - Hg stable at 9.7 from 13.9 on admission with no active bleeding or  hemodynamic instability. Pt with no prior history of anemia with 100 mL EBL from recent dental extraction. - monitor CBC 4/20 for post-op blood loss 2/2 laparotomy today -Monitor for bleeding and transfuse if <7  Tobacco Abuse - Pt with 1-2 pack history for approx past 30 years.  -Continue nicotine 21 mg patch daily  -Pt counseled on tobacco cessation   Alcohol Abuse - Date of last drink unclear. History of remote alcohol-withdrawal seizures.  -Continue CIWA protocol  -Counsel on cessation  Vitamin D Deficiency - Level < 4 on 03/15/15. -Continue liquid ergocalciferol 50 K U weekly (Week 2/8)  Folate Deficiency - Folate level low at 3.2 on 03/15/15.  -Continue IV folate 1 mg daily   Marijuana Abuse - Pt reports smoking THC 1-2 times weekly.  -Pt counseled on cessation   Thoracic Aortic  Dilation - 4 cm ascending aortic dilation seen on CTA chest. -Needs follow-up CTA or MRA in 1 year  Prolonged QT - EKG on 03/15/15 with QTc of 470 in setting of hypomagnesemia (1.4) and chronic alcohol abuse.  -Avoid prolonging QT medications  -Replete magnesium   Diet: Clears  DVT Ppx: Lovenox, SCD's Code: Full   Dispo: Disposition is deferred at this time, awaiting improvement of current medical problems. Anticipated discharge in approximately 1-3 day(s).   The patient does have a current PCP (Pcp Not In System) and does need an [facility] hospital follow-up appointment after discharge.  The patient does have transportation limitations that hinder transportation to clinic appointments. .Services Needed at time of discharge: Y = Yes, Blank = No PT:   OT:   RN:   Equipment:   Other:     LOS: 5 days   Carolan Shiver, Med Student 03/31/2015, 7:38 AM

## 2015-04-01 ENCOUNTER — Encounter (HOSPITAL_COMMUNITY): Payer: Self-pay | Admitting: General Surgery

## 2015-04-01 ENCOUNTER — Ambulatory Visit (HOSPITAL_COMMUNITY): Payer: Self-pay

## 2015-04-01 ENCOUNTER — Other Ambulatory Visit (HOSPITAL_COMMUNITY): Payer: Self-pay

## 2015-04-01 DIAGNOSIS — Z430 Encounter for attention to tracheostomy: Secondary | ICD-10-CM | POA: Insufficient documentation

## 2015-04-01 DIAGNOSIS — F1210 Cannabis abuse, uncomplicated: Secondary | ICD-10-CM

## 2015-04-01 DIAGNOSIS — E559 Vitamin D deficiency, unspecified: Secondary | ICD-10-CM

## 2015-04-01 DIAGNOSIS — C139 Malignant neoplasm of hypopharynx, unspecified: Secondary | ICD-10-CM

## 2015-04-01 DIAGNOSIS — D62 Acute posthemorrhagic anemia: Secondary | ICD-10-CM

## 2015-04-01 DIAGNOSIS — I77810 Thoracic aortic ectasia: Secondary | ICD-10-CM

## 2015-04-01 DIAGNOSIS — F17200 Nicotine dependence, unspecified, uncomplicated: Secondary | ICD-10-CM

## 2015-04-01 DIAGNOSIS — C329 Malignant neoplasm of larynx, unspecified: Secondary | ICD-10-CM

## 2015-04-01 DIAGNOSIS — D529 Folate deficiency anemia, unspecified: Secondary | ICD-10-CM

## 2015-04-01 DIAGNOSIS — Z931 Gastrostomy status: Secondary | ICD-10-CM

## 2015-04-01 DIAGNOSIS — C109 Malignant neoplasm of oropharynx, unspecified: Secondary | ICD-10-CM

## 2015-04-01 DIAGNOSIS — I4581 Long QT syndrome: Secondary | ICD-10-CM

## 2015-04-01 DIAGNOSIS — Z8679 Personal history of other diseases of the circulatory system: Secondary | ICD-10-CM

## 2015-04-01 LAB — CBC
HEMATOCRIT: 30.9 % — AB (ref 39.0–52.0)
HEMOGLOBIN: 10.6 g/dL — AB (ref 13.0–17.0)
MCH: 32.6 pg (ref 26.0–34.0)
MCHC: 34.3 g/dL (ref 30.0–36.0)
MCV: 95.1 fL (ref 78.0–100.0)
Platelets: 225 10*3/uL (ref 150–400)
RBC: 3.25 MIL/uL — ABNORMAL LOW (ref 4.22–5.81)
RDW: 14.1 % (ref 11.5–15.5)
WBC: 10.5 10*3/uL (ref 4.0–10.5)

## 2015-04-01 LAB — BASIC METABOLIC PANEL
ANION GAP: 14 (ref 5–15)
BUN: <5 mg/dL — ABNORMAL LOW (ref 6–23)
CO2: 25 mmol/L (ref 19–32)
Calcium: 8.9 mg/dL (ref 8.4–10.5)
Chloride: 94 mmol/L — ABNORMAL LOW (ref 96–112)
Creatinine, Ser: 0.66 mg/dL (ref 0.50–1.35)
Glucose, Bld: 92 mg/dL (ref 70–99)
POTASSIUM: 3.8 mmol/L (ref 3.5–5.1)
SODIUM: 133 mmol/L — AB (ref 135–145)

## 2015-04-01 LAB — GLUCOSE, CAPILLARY
Glucose-Capillary: 87 mg/dL (ref 70–99)
Glucose-Capillary: 101 mg/dL — ABNORMAL HIGH (ref 70–99)

## 2015-04-01 LAB — MAGNESIUM: MAGNESIUM: 1.3 mg/dL — AB (ref 1.5–2.5)

## 2015-04-01 MED ORDER — MAGNESIUM SULFATE 2 GM/50ML IV SOLN
2.0000 g | Freq: Once | INTRAVENOUS | Status: AC
  Administered 2015-04-01: 2 g via INTRAVENOUS
  Filled 2015-04-01: qty 50

## 2015-04-01 MED ORDER — OSMOLITE 1.2 CAL PO LIQD
1000.0000 mL | ORAL | Status: DC
  Administered 2015-04-01 – 2015-04-06 (×6): 1000 mL
  Filled 2015-04-01 (×2): qty 1000

## 2015-04-01 MED ORDER — FENTANYL 50 MCG/HR TD PT72
50.0000 ug | MEDICATED_PATCH | TRANSDERMAL | Status: DC
  Administered 2015-04-01 – 2015-04-07 (×3): 50 ug via TRANSDERMAL
  Filled 2015-04-01 (×3): qty 1

## 2015-04-01 MED ORDER — SCOPOLAMINE 1 MG/3DAYS TD PT72
1.0000 | MEDICATED_PATCH | TRANSDERMAL | Status: DC
  Administered 2015-04-01 – 2015-04-04 (×2): 1.5 mg via TRANSDERMAL
  Filled 2015-04-01 (×3): qty 1

## 2015-04-01 MED ORDER — JEVITY 1.2 CAL PO LIQD: 1000.0000 mL | ORAL | Status: DC

## 2015-04-01 NOTE — Progress Notes (Signed)
04/01/2015 9:15 AM  Jeri Lager [id number]  Post-Op Day 6    Temp:  [97.9 F (36.6 C)-99.6 F (37.6 C)] 99.5 F (37.5 C) (04/20 0615) Pulse Rate:  [84-100] 91 (04/20 0759) Resp:  [13-22] 18 (04/20 0759) BP: (119-141)/(74-87) 135/83 mmHg (04/20 0615) SpO2:  [96 %-100 %] 98 % (04/20 0759) FiO2 (%):  [28 %] 28 % (04/20 0759),     Intake/Output Summary (Last 24 hours) at 04/01/15 0915 Last data filed at 04/01/15 0902  Gross per 24 hour  Intake    750 ml  Output   1213 ml  Net   -463 ml    Results for orders placed or performed during the hospital encounter of 03/26/15 (from the past 24 hour(s))  Magnesium     Status: Abnormal   Collection Time: 04/01/15  5:20 AM  Result Value Ref Range   Magnesium 1.3 (L) 1.5 - 2.5 mg/dL  Basic metabolic panel     Status: Abnormal   Collection Time: 04/01/15  5:20 AM  Result Value Ref Range   Sodium 133 (L) 135 - 145 mmol/L   Potassium 3.8 3.5 - 5.1 mmol/L   Chloride 94 (L) 96 - 112 mmol/L   CO2 25 19 - 32 mmol/L   Glucose, Bld 92 70 - 99 mg/dL   BUN <5 (L) 6 - 23 mg/dL   Creatinine, Ser 0.66 0.50 - 1.35 mg/dL   Calcium 8.9 8.4 - 10.5 mg/dL   GFR calc non Af Amer >90 >90 mL/min   GFR calc Af Amer >90 >90 mL/min   Anion gap 14 5 - 15  CBC     Status: Abnormal   Collection Time: 04/01/15  5:20 AM  Result Value Ref Range   WBC 10.5 4.0 - 10.5 K/uL   RBC 3.25 (L) 4.22 - 5.81 MIL/uL   Hemoglobin 10.6 (L) 13.0 - 17.0 g/dL   HCT 30.9 (L) 39.0 - 52.0 %   MCV 95.1 78.0 - 100.0 fL   MCH 32.6 26.0 - 34.0 pg   MCHC 34.3 30.0 - 36.0 g/dL   RDW 14.1 11.5 - 15.5 %   Platelets 225 150 - 400 K/uL    SUBJECTIVE:  Pain controlled.  G tube placed yesterday.  Starting PMV use with Speech Path  OBJECTIVE:  Trach clean and secure.  Wound mod large c/w poor nutrition.  Changed to #4 cuffless Shiley trach without difficulty.  IMPRESSION:  Satisfactory check.  PLAN:  Begin G tube feeding.  Pt and family teaching.  Passer Scientist, physiological use per  [employer].  Disposition may be challenging.  Due to have PET scan Friday if possible, and Radiation Simulation also.    Recheck with me 1 mo, sooner as needed.  [name]

## 2015-04-01 NOTE — Progress Notes (Signed)
Patient ID: Kyle Alexander, male   DOB: 12/05/1961, 54 y.o.   MRN: [id number]     [address] [place], [place] [address]    Phone: [phone] FAX: [phone]     Subjective: Some nausea, no vomiting.  Minimal output from G tube which is set to gravity.    Objective:  Vital signs:  Filed Vitals:   04/01/15 0003 04/01/15 0404 04/01/15 0615 04/01/15 0759  BP:   135/83   Pulse: 94 100 100 91  Temp:   99.5 F (37.5 C)   TempSrc:   Oral   Resp: $Remo'18 18 22 18  'PknxM$ Height:      Weight:      SpO2: 98% 100% 100% 98%    Last BM Date: 03/27/15  Intake/Output   Yesterday:  04/19 0701 - 04/20 0700 In: 750 [I.V.:750] Out: 1213 [Urine:1200; Blood:13] This shift:    Physical Exam: General: Pt awake/alert/oriented x4 in no acute distress Abdomen: Soft.  Nondistended.  Mildly tender at incisions only.  Midline dressing is in place.  G tube to gravity. No evidence of peritonitis.  No incarcerated hernias.    Problem List:   Active Problems:   HTN (hypertension)   Tobacco abuse   Alcohol abuse   Hypokalemia   Marijuana abuse   Folate deficiency   Protein-calorie malnutrition, severe   Vitamin D deficiency   Squamous cell carcinoma of supraglottis   Head and neck cancer    Results:   Labs: Results for orders placed or performed during the hospital encounter of 03/26/15 (from the past 48 hour(s))  CBC     Status: Abnormal   Collection Time: 03/31/15  5:30 AM  Result Value Ref Range   WBC 9.3 4.0 - 10.5 K/uL   RBC 2.97 (L) 4.22 - 5.81 MIL/uL   Hemoglobin 9.7 (L) 13.0 - 17.0 g/dL   HCT 28.6 (L) 39.0 - 52.0 %   MCV 96.3 78.0 - 100.0 fL   MCH 32.7 26.0 - 34.0 pg   MCHC 33.9 30.0 - 36.0 g/dL   RDW 14.4 11.5 - 15.5 %   Platelets 189 150 - 400 K/uL  Basic metabolic panel     Status: Abnormal   Collection Time: 03/31/15  5:30 AM  Result Value Ref Range   Sodium 135 135 - 145 mmol/L   Potassium 3.3 (L) 3.5 -  5.1 mmol/L   Chloride 95 (L) 96 - 112 mmol/L   CO2 29 19 - 32 mmol/L   Glucose, Bld 77 70 - 99 mg/dL   BUN <5 (L) 6 - 23 mg/dL   Creatinine, Ser 0.52 0.50 - 1.35 mg/dL   Calcium 8.7 8.4 - 10.5 mg/dL   GFR calc non Af Amer >90 >90 mL/min   GFR calc Af Amer >90 >90 mL/min    Comment: (NOTE) The eGFR has been calculated using the CKD EPI equation. This calculation has not been validated in all clinical situations. eGFR's persistently <90 mL/min signify possible Chronic Kidney Disease.    Anion gap 11 5 - 15  Magnesium     Status: Abnormal   Collection Time: 04/01/15  5:20 AM  Result Value Ref Range   Magnesium 1.3 (L) 1.5 - 2.5 mg/dL  Basic metabolic panel     Status: Abnormal   Collection Time: 04/01/15  5:20 AM  Result Value Ref Range   Sodium 133 (L) 135 - 145 mmol/L  Potassium 3.8 3.5 - 5.1 mmol/L   Chloride 94 (L) 96 - 112 mmol/L   CO2 25 19 - 32 mmol/L   Glucose, Bld 92 70 - 99 mg/dL   BUN <5 (L) 6 - 23 mg/dL   Creatinine, Ser 0.66 0.50 - 1.35 mg/dL   Calcium 8.9 8.4 - 10.5 mg/dL   GFR calc non Af Amer >90 >90 mL/min   GFR calc Af Amer >90 >90 mL/min    Comment: (NOTE) The eGFR has been calculated using the CKD EPI equation. This calculation has not been validated in all clinical situations. eGFR's persistently <90 mL/min signify possible Chronic Kidney Disease.    Anion gap 14 5 - 15  CBC     Status: Abnormal   Collection Time: 04/01/15  5:20 AM  Result Value Ref Range   WBC 10.5 4.0 - 10.5 K/uL   RBC 3.25 (L) 4.22 - 5.81 MIL/uL   Hemoglobin 10.6 (L) 13.0 - 17.0 g/dL   HCT 30.9 (L) 39.0 - 52.0 %   MCV 95.1 78.0 - 100.0 fL   MCH 32.6 26.0 - 34.0 pg   MCHC 34.3 30.0 - 36.0 g/dL   RDW 14.1 11.5 - 15.5 %   Platelets 225 150 - 400 K/uL    Imaging / Studies: Ir Fluoro Rm 30-60 Min  04-18-15   CLINICAL DATA:  Advanced head and neck malignancy, malnutrition  EXAM: FLUOROSCOPIC EXAM OF THE STOMACH. (UNABLE TO PLACE PERCUTANEOUS GASTROSTOMY)  Date:   May 07, 20162016/05/07 2:31 pm  Radiologist:  [name], MD  Guidance:  Fluoroscopic  FLUOROSCOPY TIME:  1 minutes 18 seconds, 9 mGy  MEDICATIONS AND MEDICAL HISTORY: 1 mg Versed, 50 mcg fentanyl  ANESTHESIA/SEDATION: 15 minutes  CONTRAST:  31mL OMNIPAQUE IOHEXOL 300 MG/ML  SOLN  COMPLICATIONS: None immediate  PROCEDURE: Informed consent was obtained from the patient following explanation of the procedure, risks, benefits and alternatives. The patient understands, agrees and consents for the procedure. All questions were addressed. A time out was performed.  Initially, a 5 French catheter was advanced through the oral cavity into the stomach under fluoroscopy. This was performed after conscious sedation. This was utilized to insufflate the stomach with air. Stomach was noted to be high in the abdomen beneath the left subcostal margin despite adequate insufflation of the stomach with air. The stomach does not extend inferiorly below the subcostal margin to allow percutaneous needle access safely. Therefore the procedure cannot be performed with percutaneous fluoroscopic technique.  IMPRESSION: High positioned stomach beneath the left subcostal margin, not accessible safely by percutaneous fluoroscopic technique without risking bowel injury.  PLAN: Findings discussed with the patient's medical staff. Recommend surgical consultation for gastrostomy placement.  COMPARISON:  03/16/2015   Electronically Signed   By: Jerilynn Mages.  Shick M.D.   On: Apr 18, 2015 14:54    Medications / Allergies:  Scheduled Meds: . chlorhexidine  15 mL Mouth/Throat BID  . enoxaparin (LOVENOX) injection  40 mg Subcutaneous Q24H  . ergocalciferol  50,000 Units Oral Weekly  . feeding supplement (RESOURCE BREEZE)  1 Container Oral TID BM  . folic acid  1 mg Intravenous Daily  . multivitamin  5 mL Oral Daily  . nicotine  21 mg Transdermal Daily  . thiamine  100 mg Oral Daily   Or  . thiamine  100 mg Intravenous Daily   Continuous  Infusions: . lactated ringers 75 mL/hr at 04-18-15 2132  . lactated ringers Stopped (03/28/15 1914)  . lactated ringers 50 mL/hr at 04/01/15 6294  .  sodium chloride irrigation     PRN Meds:.HYDROmorphone (DILAUDID) injection, LORazepam **OR** LORazepam, morphine injection, ondansetron (ZOFRAN) IV, sodium chloride  Antibiotics: Anti-infectives    Start     Dose/Rate Route Frequency Ordered Stop   03/30/15 0600  ceFAZolin (ANCEF) IVPB 2 g/50 mL premix    Comments:  Send with pt to IR for procedure, time unknown yet.   2 g 100 mL/hr over 30 Minutes Intravenous On call 03/29/15 0739 03/30/15 1410   03/26/15 0730  ceFAZolin (ANCEF) IVPB 2 g/50 mL premix     2 g 100 mL/hr over 30 Minutes Intravenous On call to O.R. 03/26/15 0727 03/26/15 1126   03/26/15 0000  ceFAZolin (ANCEF) IVPB 2 g/50 mL premix  Status:  Discontinued     2 g 100 mL/hr over 30 Minutes Intravenous  Once 03/25/15 1339 03/26/15 1619        Assessment/Plan POD#1 open gastrostomy tube placement---Dr. Donne Hazel -clamp, may give clears once cleared by SLP or can start trickle TF today if SLP doesn't evaluate or if he fails.  -leave dressing on today -SCD/lovenox -IS -mobilize as tolerated   Erby Pian, ANP-BC [facility] Pager [phone](7A-4:30P) For consults and floor pages call [phone](7A-4:30P)  04/01/2015 10:29 AM

## 2015-04-01 NOTE — Progress Notes (Addendum)
NUTRITION FOLLOW UP  Pt meets criteria for severe MALNUTRITION in the context of chronic illness as evidenced by severe fat and muscle depletion.  Intervention:   -D/c Resource Breeze po TID, each supplement provides 250 kcal and 9 grams of protein -Initiate Osmolite 1.2 @ 20 ml/hr via G-tube and increase by 10 ml every 12 hours to goal rate of 65 ml/hr.  -Tube feeding regimen provides 1872 kcal (100% of needs), 87 grams of protein, and 1279 ml of H2O.  -Monitor Mg, K, and Phos daily x 3 days due to pt's high risk of refeeding syndrome  Nutrition Dx:   Malnutrition related to decreased oral intake as evidenced by severe fat and muscle depletion; ongoing  Goal:   Pt will meet >90% of estimated nutritional needs; unmet  Monitor:   TF initiation/tolerance/ advancement, PO/supplement intake, diet advancement, labs, weight changes, I/O's  Assessment:   RODDRICK Alexander is a 54 y.o. male with squamous cell carcinoma of oropharynx, hypopharynx, and larynx S/p tracheostomy 4/14 and multiple dental extractions on 4/14. IR received request for percutaneous gastrostomy tube placement for nutrition during treatment. He denies any chest pain, shortness of breath or palpitations. He denies any recent fever or chills. He has previously tolerated sedation without complications and denies any previous abdominal surgeries.   S/p Procedure(s) 03/26/15: TRACHEOSTOMY POSSIBLE AWAKE TRACHEOSTOMY POSSIBLE DENTAL EXTRACTIONS (N/A) MULTIPLE EXTRACTION WITH ALVEOLOPLASTY (N/A)   Pt s/p open gastrostomy tube placement on 03/31/15. He is lethargic at time of visit. RD received consult to initiate TF on 03/27/15, however, did not initiate feedings at that time due to lack of access.  Spoke with RN who reports SLP evaluated and recommending ice chips only at this time. Pt has PSMV in place and is tolerating well. RN reveals that MD reports that G-tube is ready to use but are awaiting on TF orders. RN is also in the  process of ordering kangaroo pump for feedings.   Labs reviewed. K: 3.3, Cl: 95, BUN <5, Mg: 1.4, Glucose: 77.   Height: Ht Readings from Last 1 Encounters:  03/26/15 5' 10.5" (1.791 m)    Weight Status:   Wt Readings from Last 1 Encounters:  03/30/15 135 lb 2.3 oz (61.3 kg)   03/27/15 124 lb 1.9 oz (56.3 kg)       Re-estimated needs:  Kcal: 1800-2000 Protein: 85-95 grams Fluid: 1.8-2.0 L  Skin: closed abdominal incision, closed neck incision, gastrostomy tube  Diet Order: Diet NPO time specified Except for: Sips with Meds   Intake/Output Summary (Last 24 hours) at 04/01/15 1209 Last data filed at 04/01/15 0902  Gross per 24 hour  Intake      0 ml  Output   1200 ml  Net  -1200 ml    Last BM: 03/31/15   Labs:   Recent Labs Lab 03/27/15 0256  03/29/15 0410 03/30/15 0345 03/31/15 0530 04/01/15 0520  NA 136  < >  --  135 135 133*  K 4.6  < >  --  3.1* 3.3* 3.8  CL 96  < >  --  96 95* 94*  CO2 27  < >  --  31 29 25   BUN 7  < >  --  <5* <5* <5*  CREATININE 0.91  < >  --  0.51 0.52 0.66  CALCIUM 9.6  < >  --  9.0 8.7 8.9  MG 1.3*  < > 1.4* 1.4*  --  1.3*  PHOS 3.1  --   --   --   --   --  GLUCOSE 83  < >  --  107* 77 92  < > = values in this interval not displayed.  CBG (last 3)  No results for input(s): GLUCAP in the last 72 hours.  Scheduled Meds: . chlorhexidine  15 mL Mouth/Throat BID  . enoxaparin (LOVENOX) injection  40 mg Subcutaneous Q24H  . ergocalciferol  50,000 Units Oral Weekly  . feeding supplement (RESOURCE BREEZE)  1 Container Oral TID BM  . fentaNYL  50 mcg Transdermal Q72H  . folic acid  1 mg Intravenous Daily  . magnesium sulfate 1 - 4 g bolus IVPB  2 g Intravenous Once  . multivitamin  5 mL Oral Daily  . nicotine  21 mg Transdermal Daily  . thiamine  100 mg Oral Daily   Or  . thiamine  100 mg Intravenous Daily    Continuous Infusions: . lactated ringers 75 mL/hr at 03/30/15 2132  . lactated ringers Stopped (03/28/15 1914)   . lactated ringers 50 mL/hr at 04/01/15 4503  . sodium chloride irrigation      Tomie Elko A. Jimmye Norman, RD, LDN, CDE Pager: [phone] After hours Pager: [phone]

## 2015-04-01 NOTE — Clinical Social Work Placement (Signed)
   CLINICAL SOCIAL WORK PLACEMENT  NOTE  Date:  04/01/2015  Patient Details  Name: Kyle Alexander MRN: [id number] Date of Birth: [birth year]  Clinical Social Work is seeking post-discharge placement for this patient at the [place] level of care (*CSW will initial, date and re-position this form in  chart as items are completed):  Yes   Patient/family provided with [facility] Work Department's list of facilities offering this level of care within the geographic area requested by the patient (or if unable, by the patient's family).  Yes   Patient/family informed of their freedom to choose among providers that offer the needed level of care, that participate in Medicare, Medicaid or managed care program needed by the patient, have an available bed and are willing to accept the patient.  Yes   Patient/family informed of [facility]'s ownership interest in [facility] and [facility], as well as of the fact that they are under no obligation to receive care at these facilities.  PASRR submitted to EDS on 03/30/15     PASRR number received on 03/30/15     Existing PASRR number confirmed on       FL2 transmitted to all facilities in geographic area requested by pt/family on 03/30/15     FL2 transmitted to all facilities within larger geographic area on 03/30/15     Patient informed that his/her managed care company has contracts with or will negotiate with certain facilities, including the following:            Patient/family informed of bed offers received.  Patient chooses bed at       Physician recommends and patient chooses bed at      Patient to be transferred to   on  .  Patient to be transferred to facility by       Patient family notified on   of transfer.  Name of family member notified:        PHYSICIAN Please sign FL2     Additional Comment:    _______________________________________________ Jones Broom. Wilson, MSW,  [place] 04/01/2015 5:46 PM

## 2015-04-01 NOTE — Progress Notes (Signed)
Speech Language Pathology Treatment: Dysphagia;Passy Muir Speaking valve  Patient Details Name: Kyle Alexander MRN: [id number] DOB: Jan 20, 1961 Today's Date: 04/01/2015 Time: 8850-2774 SLP Time Calculation (min) (ACUTE ONLY): 42 min  Assessment / Plan / Recommendation Clinical Impression  Pt is s/p trach change, now with #4 cuffless trach. RN provided deep suctioning prior to PMV placement. Pt wore PMV for 30 minutes under direct supervision from SLP with no overt signs of intolerance. Pt was able to increase vocal intensity for improved intelligibility with Mod cues from therapist. Voice remains hoarse and wet, which does impact overall clarity.  With PMV in place, pt performed oral care followed by intake of ice chips, which resulted in consistent coughing and increase in wet vocal quality. Given difficulties observed with ice chips, would recommend to remain NPO. However, would allow ice chips PRN after oral care with PMV in place in order to utilize swallowing musculature. Pt was left with nursing in room with PMV still in place. Would encouragement intermittent use throughout the day as tolerated, with close but intermittent supervision provided by staff. Pt and staff both educated on the above recommendations.   HPI HPI: Kyle Alexander is a 54 y.o. male with squamous cell carcinoma of oropharynx, hypopharynx, and larynx S/p tracheostomy 4/14 and multiple dental extractions on 4/14.   Pertinent Vitals Pain Assessment: Faces Faces Pain Scale: No hurt  SLP Plan  Continue with current plan of care    Recommendations Diet recommendations: NPO Medication Administration: Via alternative means      Patient may use Passy-Muir Speech Valve: During all therapies with supervision;During PO intake/meals;Intermittently with supervision PMSV Supervision: Intermittent       Oral Care Recommendations: Oral care Q4 per protocol;Oral care prior to ice chips Follow up Recommendations: Skilled Nursing  facility Plan: Continue with current plan of care    Germain Osgood, M.A. CCC-SLP [phone]  Germain Osgood 04/01/2015, 12:08 PM

## 2015-04-01 NOTE — Progress Notes (Signed)
At time of ATC setup change, pt tried on 21% RA to see how his reserve was. Pt's sats dropped to around 88-89% on RA. Pt placed back on 28% ATC, and O2 sats came back up to around 97%. RT will continue to monitor.

## 2015-04-01 NOTE — Clinical Social Work Note (Signed)
Clinical Social Work Assessment  Patient Details  Name: Kyle Alexander MRN: [id number] Date of Birth: July 11, 1961  Date of referral:  March 30, 2014               Reason for consult:  SNF placement            Permission sought to share information with: na   Permission granted to share information::  na  Name::     na  Agency::   na  Relationship::   na  Contact Information:   na  Housing/Transportation Living arrangements for the past 2 months: Patient has been living in an extended stay hotel Source of Information:  Patient and his sister Kyle Alexander Patient Interpreter Needed:   na Criminal Activity/Legal Involvement Pertinent to Current Situation/Hospitalization:   na Significant Relationships:   Patient's sister Kyle Alexander Lives with:   Alone Do you feel safe going back to the place where you live?   yes Need for family participation in patient care:   yes  Care giving concerns:  Patient's sister is concerned that he will not be able to live on his own while he is getting his radiation therapy for his cancer and she lives in [place].  Patient's sister is hoping that patient can go to a SNF for short term rehab and trach care while patient is going through cancer treatments.  Social Worker assessment / plan:  Patient is a 54 year old male who lives alone in an extended stay motel.  Patient is a new trach patient, he has a new peg tube, and he has stage 4 cancer.  Patient expressed he would rather not go to a SNF because of his age and he wants to be back where he was living.  Patient is expressed he is going through many things at once and he is willing to have aggressive treatment for his cancer.  Patient's sister has been at bedside for several days and expressed she is overwhelmed b how many medical problems he has going on at the same time.  Patient's sister expressed that she is trying to do whatever she can to help patient get the appropriate help he needs to recover.   Patient's sister talked about how she is trying to help patient be organized and she is trying to help him take care of his physical and mental needs.  Patient's sister works full time and has her own family and is not available to be with patient 24 hours which is part of the reason she is in agreement to him going to a SNF for short term care.  Patient and his sistter were explained the process of finding a SNF and what CSW's role is.  Patient and his sister were explained about the situation what options are available to patient.   Employment status:   currently unemployed Forensic scientist:   self-pay, medicaid pending PT Recommendations:   SNF Information / Referral to community resources:   Patient was provided information about SNF options  Patient/Family's Response to care:  Patient and his sister are in agreement to going to SNF for short term rehab and while he is getting his radiation therapy.  Patient/Family's Understanding of and Emotional Response to Diagnosis, Current Treatment, and Prognosis:  Patient and his sister are aware of the struggles he will be going through and aware of how difficult it may be to continue to get treatment but it is aware it will be a long process  and road to recovery.                                                                                                                                                                                                                                                                                                                                                                                                        Emotional Assessment Appearance:   Patient looks tired, and nervous about how recovery will be.   Attitude/Demeanor/Rapport:   Patient was quiet, but understanding. Affect (typically observed):   Patient has a new trach and is using the  Orientation:   Alert and oriented x4 Alcohol / Substance use:   Yes  history of alcohol and marijuana use. Psych involvement (Current and /or in the community):   no  Discharge Needs  Concerns to be addressed:   Patient will need stable housing once he has completed his treatments Readmission within the last 30 days:   no Current discharge risk:    Barriers to Discharge:      Jones Broom. [place], MSW, [place] 04/01/2015 5:42 PM

## 2015-04-01 NOTE — Progress Notes (Signed)
Subjective:  Patient in bed, breathing more comfortably than from previous exams. Per sister, patient is struggling with current situation and feels he could benefit from possibly psychiatric evaluation. Per sister, he is unwilling to tolerate any pain and she is concerned that he will move "from one addiction to another" with increasing pain medications. Family will not be able to be present around the clock and sister is concerned about patient's ability to self-manage in the setting of lower acuity setting (i.e. SNF).   Patient is nonverbal 2/2 trach, but able to gesture and nod in response to questions. Denies current pain. Has passed gas once, no BMs since surgery yesterday. Complained of some nausea yesterday, no emesis.   Objective: Vital signs in last 24 hours: Filed Vitals:   04/01/15 0003 04/01/15 0404 04/01/15 0615 04/01/15 0759  BP:   135/83   Pulse: 94 100 100 91  Temp:   99.5 F (37.5 C)   TempSrc:   Oral   Resp: 18 18 22 18   Height:      Weight:      SpO2: 98% 100% 100% 98%   Weight change:   Intake/Output Summary (Last 24 hours) at 04/01/15 1138 Last data filed at 04/01/15 0902  Gross per 24 hour  Intake      0 ml  Output   1200 ml  Net  -1200 ml   General: awake in bed HEENT: EOMI Cardiac: RRR, no rubs, murmurs or gallops Pulm: poor inspiratory effort on ventilation. Decreased breath sounds in LLL Abd: soft, nontender, nondistended, no BS, gastrostomy tube present, no drainage from tube, serosanguinous drainage on dressing.  Ext: thin warm and well perfused, no pedal edema Neuro: nonverbal 2/2 trach. Alert and responds to questions with gestures.  Lab Results: Basic Metabolic Panel:  Recent Labs  03/30/15 0345 03/31/15 0530 04/01/15 0520  NA 135 135 133*  K 3.1* 3.3* 3.8  CL 96 95* 94*  CO2 31 29 25   GLUCOSE 107* 77 92  BUN <5* <5* <5*  CREATININE 0.51 0.52 0.66  CALCIUM 9.0 8.7 8.9  MG 1.4*  --  1.3*   Liver Function Tests: No results for  input(s): AST, ALT, ALKPHOS, BILITOT, PROT, ALBUMIN in the last 72 hours. No results for input(s): LIPASE, AMYLASE in the last 72 hours. No results for input(s): AMMONIA in the last 72 hours. CBC:  Recent Labs  03/31/15 0530 04/01/15 0520  WBC 9.3 10.5  HGB 9.7* 10.6*  HCT 28.6* 30.9*  MCV 96.3 95.1  PLT 189 225   Coagulation:  Recent Labs  03/30/15 0345  LABPROT 14.7  INR 1.14   Urine Drug Screen: Drugs of Abuse     Component Value Date/Time   LABOPIA NONE DETECTED 03/15/2015 1533   COCAINSCRNUR NONE DETECTED 03/15/2015 1533   LABBENZ NONE DETECTED 03/15/2015 1533   AMPHETMU NONE DETECTED 03/15/2015 1533   THCU POSITIVE* 03/15/2015 1533   LABBARB NONE DETECTED 03/15/2015 1533    Studies/Results: Ir Fluoro Rm 30-60 Min  03/30/2015   CLINICAL DATA:  Advanced head and neck malignancy, malnutrition  EXAM: FLUOROSCOPIC EXAM OF THE STOMACH. (UNABLE TO PLACE PERCUTANEOUS GASTROSTOMY)  Date:  4/18/20164/18/2016 2:31 pm  Radiologist:  [name], MD  Guidance:  Fluoroscopic  FLUOROSCOPY TIME:  1 minutes 18 seconds, 9 mGy  MEDICATIONS AND MEDICAL HISTORY: 1 mg Versed, 50 mcg fentanyl  ANESTHESIA/SEDATION: 15 minutes  CONTRAST:  36mL OMNIPAQUE IOHEXOL 300 MG/ML  SOLN  COMPLICATIONS: None immediate  PROCEDURE: Informed  consent was obtained from the patient following explanation of the procedure, risks, benefits and alternatives. The patient understands, agrees and consents for the procedure. All questions were addressed. A time out was performed.  Initially, a 5 French catheter was advanced through the oral cavity into the stomach under fluoroscopy. This was performed after conscious sedation. This was utilized to insufflate the stomach with air. Stomach was noted to be high in the abdomen beneath the left subcostal margin despite adequate insufflation of the stomach with air. The stomach does not extend inferiorly below the subcostal margin to allow percutaneous needle access safely.  Therefore the procedure cannot be performed with percutaneous fluoroscopic technique.  IMPRESSION: High positioned stomach beneath the left subcostal margin, not accessible safely by percutaneous fluoroscopic technique without risking bowel injury.  PLAN: Findings discussed with the patient's medical staff. Recommend surgical consultation for gastrostomy placement.  COMPARISON:  03/16/2015   Electronically Signed   By: Jerilynn Mages.  Shick M.D.   On: 03/30/2015 14:54   Medications: I have reviewed the patient's current medications. Scheduled Meds: . chlorhexidine  15 mL Mouth/Throat BID  . enoxaparin (LOVENOX) injection  40 mg Subcutaneous Q24H  . ergocalciferol  50,000 Units Oral Weekly  . feeding supplement (RESOURCE BREEZE)  1 Container Oral TID BM  . fentaNYL  50 mcg Transdermal Q72H  . folic acid  1 mg Intravenous Daily  . magnesium sulfate 1 - 4 g bolus IVPB  2 g Intravenous Once  . multivitamin  5 mL Oral Daily  . nicotine  21 mg Transdermal Daily  . thiamine  100 mg Oral Daily   Or  . thiamine  100 mg Intravenous Daily   Continuous Infusions: . lactated ringers 75 mL/hr at 03/30/15 2132  . lactated ringers Stopped (03/28/15 1914)  . lactated ringers 50 mL/hr at 04/01/15 2297  . sodium chloride irrigation     PRN Meds:.HYDROmorphone (DILAUDID) injection, LORazepam **OR** LORazepam, ondansetron (ZOFRAN) IV, sodium chloride Assessment/Plan: Active Problems:   HTN (hypertension)   Tobacco abuse   Alcohol abuse   Hypokalemia   Marijuana abuse   Folate deficiency   Protein-calorie malnutrition, severe   Vitamin D deficiency   Squamous cell carcinoma of supraglottis   Head and neck cancer   Pharyngeal cancer   Hypomagnesemia   Tracheostomy care  T4N2C Squamous Cell Carcinoma of Oropharynx, Hypopharynx, and Larynx s/p tracheostomy and multiple dental extractions on 4/14 and s/p gastrostomy placement with general surgery on 4/19 - Currently with 100% SpO2 on trach collar. PT/OT  evaluated, recommending SNF though current pain, nutrition, and trach management may require higher level of care than that available at SNF.   - ENT following - changed to #4 cuffless trach today, con't trach and oral care - Surgery following, appreciate recs - clamp G-tube, per SLP, keep NPO and can start trickle TF today. If successful tube feeds, can d/c fluids - PT/OT rec SNF, patient may require higher level of care for pain and trach management -PET scan scheduled for April 22, will need to be OP - radiation simulation scheduled for April 22, can be done as inpatient @ [facility] - fentanyl patch 61mcg q72 hours - IV hydromorphone 1mg  q4hrs PRN (severe pain) -Chlorhexidine rinses BID and salt rinses Q 2 hr in between rinses  -Dental suture removal 7-10 days post-op (self-dissolvable)  New tracheostomy s/p trach downsize and intermittent PMV use: S/p trach downsize on 4/20 (#4 cuffless trach). Patient appears to be more comfortable with managing secretions. Patient tolerated PMV  x96minutes with SLP today.  - ENT following - changed to #4 cuffless trach today, con't trach and oral care - SLP following, continue to recommend NPO with ice chips PRN after oral care with PMV in place. - continue intermittent PMV use as tolerated  - consider humidifier for thinning secretions vs scopolamine patch. - no velcro ties for at least 1 week.   Nutrition: Prealbumin 11 on 4/3. Will need extensive nutrition counseling and repletion, ideally prior to initiation of radiation. Can start using Gtube 4/20 PM.  - start TF today - start resource breeze 1 can TID   Hypomagnesemia - Mg 1.3 this AM in setting of chronic alcohol abuse.  -Administer IV Magnesium sulfate 2 g -Continue to monitor Mg level  Hypertension - Currently normotensive. Pt not on antihypertensives at home. -Consider adding amlodipine 5 mg daily if uncontrolled  Resolving normocytic Anemia likely 2/2 blood loss - Hg stable 10.6 from 13.9  on admission with no active bleeding or hemodynamic instability. Pt with no prior history of anemia with 100 mL EBL from recent dental extraction (4/16) and minimal blood loss from gastrostomy on 4/19. -Monitor for bleeding and transfuse if <7  Tobacco Abuse - Pt with 1-2 pack history for approx past 30 years.  -Continue nicotine 21 mg patch daily  -Pt counseled on tobacco cessation   Alcohol Abuse - Date of last drink unclear. History of remote alcohol-withdrawal seizures.  -Continue CIWA protocol  -Counsel on cessation  Vitamin D Deficiency - Level < 4 on 03/15/15. -Continue liquid ergocalciferol 50 K U weekly (Week 2/8)  Folate Deficiency - Folate level low at 3.2 on 03/15/15.  -Continue IV folate 1 mg daily   Marijuana Abuse - Pt reports smoking THC 1-2 times weekly.  -Pt counseled on cessation   Thoracic Aortic Dilation - 4 cm ascending aortic dilation seen on CTA chest. -Needs follow-up CTA or MRA in 1 year  Prolonged QT - EKG on 03/15/15 with QTc of 470 in setting of hypomagnesemia (1.3) and chronic alcohol abuse.  -Avoid prolonging QT medications  -Replete magnesium   Diet: NPO DVT Ppx: Lovenox & SCDs Code: Full   Dispo: Disposition is deferred at this time, awaiting improvement of current medical problems. Anticipated discharge in approximately 1-3 day(s).   The patient does have a current PCP (Pcp Not In System) and does need an [facility] hospital follow-up appointment after discharge.  The patient does have transportation limitations that hinder transportation to clinic appointments. .Services Needed at time of discharge: Y = Yes, Blank = No PT:   OT:   RN:   Equipment:   Other:     LOS: 6 days   Carolan Shiver, Med Student 04/01/2015, 11:38 AM

## 2015-04-01 NOTE — Progress Notes (Signed)
Subjective:  Kyle Alexander was seen and examined at bedside. S/p gastrostomy tube placement by surgery yesterday. His sister was present in the room this morning who is concerned about his self management in current illness. Her and her brother are currently unable to provide 24 hours of supervision. She anticipates him needing significant social support going forward.   Objective: Vital signs in last 24 hours: Filed Vitals:   03/31/15 2045 04/01/15 0003 04/01/15 0404 04/01/15 0615  BP: 141/87   135/83  Pulse: 96 94 100 100  Temp: 99.6 F (37.6 C)   99.5 F (37.5 C)  TempSrc: Oral   Oral  Resp: 16 18 18 22   Height:      Weight:      SpO2: 99% 98% 100% 100%   Weight change:   Intake/Output Summary (Last 24 hours) at 04/01/15 0747 Last data filed at 04/01/15 0058  Gross per 24 hour  Intake    750 ml  Output   1213 ml  Net   -463 ml   PHYSICAL EXAMINATION: General: sitting up in bed, cachectic appearing  HEENT:  Edentulous. +trach, on 5L supplemental o2 Lungs: Trach collar in place. Decreased breath sounds at bases with shallow respirations Heart: RRR Abdomen: Soft, -bs, dressing in place of peg tube that is dry with some drainage visible in gauze Extremities: No edema, moving extremities Neuro: responding to questions mainly through gestures   Lab Results: Basic Metabolic Panel:  Recent Labs Lab 03/27/15 0256  03/30/15 0345 03/31/15 0530 04/01/15 0520  NA 136  < > 135 135 133*  K 4.6  < > 3.1* 3.3* 3.8  CL 96  < > 96 95* 94*  CO2 27  < > 31 29 25   GLUCOSE 83  < > 107* 77 92  BUN 7  < > <5* <5* <5*  CREATININE 0.91  < > 0.51 0.52 0.66  CALCIUM 9.6  < > 9.0 8.7 8.9  MG 1.3*  < > 1.4*  --  1.3*  PHOS 3.1  --   --   --   --   < > = values in this interval not displayed. Liver Function Tests:  Recent Labs Lab 03/27/15 0256  AST 17  ALT 9  ALKPHOS 59  BILITOT 1.2  PROT 6.4    ALBUMIN 2.4*   CBC:  Recent Labs Lab 03/27/15 0256  03/31/15 0530 04/01/15 0520  WBC 14.2*  < > 9.3 10.5  NEUTROABS 11.9*  --   --   --   HGB 10.7*  < > 9.7* 10.6*  HCT 32.0*  < > 28.6* 30.9*  MCV 97.6  < > 96.3 95.1  PLT 257  < > 189 225  < > = values in this interval not displayed. CBG:  Recent Labs Lab 03/26/15 1437  GLUCAP 114*   Urine Drug Screen: Drugs of Abuse     Component Value Date/Time   LABOPIA NONE DETECTED 03/15/2015 1533   COCAINSCRNUR NONE DETECTED 03/15/2015 1533   LABBENZ NONE DETECTED 03/15/2015 1533   AMPHETMU  NONE DETECTED 03/15/2015 1533   THCU POSITIVE* 03/15/2015 1533   LABBARB NONE DETECTED 03/15/2015 1533    Studies/Results: Ir Fluoro Rm 30-60 Min  03/30/2015   CLINICAL DATA:  Advanced head and neck malignancy, malnutrition  EXAM: FLUOROSCOPIC EXAM OF THE STOMACH. (UNABLE TO PLACE PERCUTANEOUS GASTROSTOMY)  Date:  4/18/20164/18/2016 2:31 pm  Radiologist:  [name], MD  Guidance:  Fluoroscopic  FLUOROSCOPY TIME:  1 minutes 18 seconds, 9 mGy  MEDICATIONS AND MEDICAL HISTORY: 1 mg Versed, 50 mcg fentanyl  ANESTHESIA/SEDATION: 15 minutes  CONTRAST:  32mL OMNIPAQUE IOHEXOL 300 MG/ML  SOLN  COMPLICATIONS: None immediate  PROCEDURE: Informed consent was obtained from the patient following explanation of the procedure, risks, benefits and alternatives. The patient understands, agrees and consents for the procedure. All questions were addressed. A time out was performed.  Initially, a 5 French catheter was advanced through the oral cavity into the stomach under fluoroscopy. This was performed after conscious sedation. This was utilized to insufflate the stomach with air. Stomach was noted to be high in the abdomen beneath the left subcostal margin despite adequate insufflation of the stomach with air. The stomach does not extend inferiorly below the subcostal margin to allow percutaneous needle access safely. Therefore the procedure cannot be performed  with percutaneous fluoroscopic technique.  IMPRESSION: High positioned stomach beneath the left subcostal margin, not accessible safely by percutaneous fluoroscopic technique without risking bowel injury.  PLAN: Findings discussed with the patient's medical staff. Recommend surgical consultation for gastrostomy placement.  COMPARISON:  03/16/2015   Electronically Signed   By: Jerilynn Mages.  Shick M.D.   On: 03/30/2015 14:54   Medications: I have reviewed the patient's current medications. Scheduled Meds: . chlorhexidine  15 mL Mouth/Throat BID  . enoxaparin (LOVENOX) injection  40 mg Subcutaneous Q24H  . ergocalciferol  50,000 Units Oral Weekly  . feeding supplement (RESOURCE BREEZE)  1 Container Oral TID BM  . folic acid  1 mg Intravenous Daily  . multivitamin  5 mL Oral Daily  . nicotine  21 mg Transdermal Daily  . thiamine  100 mg Oral Daily   Or  . thiamine  100 mg Intravenous Daily   Continuous Infusions: . lactated ringers 75 mL/hr at 03/30/15 2132  . lactated ringers Stopped (03/28/15 1914)  . lactated ringers 50 mL/hr at 04/01/15 0973  . sodium chloride irrigation     PRN Meds:.HYDROmorphone (DILAUDID) injection, LORazepam **OR** LORazepam, morphine injection, ondansetron (ZOFRAN) IV, sodium chloride Assessment/Plan:  T4N2C Squamous Cell Carcinoma of Oropharynx, Hypopharynx, and Larynx s/p tracheostomy and multiple dental extractions on 4/14 - -Currently with 91-92% SpO2 on trach collar with 5L O2 flow. Pt needs improved pain control. S/p gastrostomy tube placement by general surgery 03/31/15.  -Appreciate ENT following--Dr. Erik Alexander changed to #4 cuffless Shiley trach today, continue trach and oral care -Appreciate surgery following -Clamp G tube, may give clears if cleared by SLP per surgery, or can start trickle TF if he fails, if starting feeds can d/c maintenance fluids -Leave dressing in place -PET scan and radiation simulation scheduled for April 22 -Continue IV dilaudid 1 mg Q 4 hr  PRN, start fentanyl patch to try to get improved pain control, may need additional prn morphine.  Ultimately, he may benefit from liquid formulation of pain medication, consider roxanol -Chlorhexidine rinses BID and salt rinses Q 2 hr in between rinses  -Suture removal in 2-4 days (self-dissolvable) s/p dental extractions -Appreciate dietician consult, continue resource breeze 1 can TID   -Appreciate  SW consult and PT & OT consults, recommend SNF -SLP following to start working with Sharen Heck valve per ENT   Hypokalemia - Resolved  Hypomagnesemia--Mag 1.3 this AM -replace -continue to monitor  History of Hypertension - Currently normotensive.   Acute Blood Loss Anemia - Hb 13.9 on admission, improve to 10.6 this morning.   -Continue to monitor CBC -Monitor for bleeding and transfuse if <7 -FOBT pending  Substance abuse--Alcohol, marijuana, and tobacco--History of remote alcohol-withdrawal seizures.   -Continue CIWA protocol  -Counseled on cessation -Nicotine patches  Vitamin D Deficiency  - Level < 4 on 03/15/15. -Continue liquid ergocalciferol 50 K U weekly (Week 2/8)  Folate Deficiency - Folate level low at 3.2 on 03/15/15. Hb 10.6 this AM with MCV 95.1. -Continue IV folate 1 mg daily   Thoracic Aortic Dilation - 4 cm ascending aortic dilation seen on CTA chest. -Needs follow-up CTA or MRA in 1 year  Prolonged QT - EKG on 03/15/15 with QTc of 470 in setting of hypomagnesemia and chronic alcohol abuse.  -Avoid prolonging QT medications  -Replete magnesium  Diet: Clears if cleared by SLP DVT Ppx: Lovenox Code: Full   Dispo: Disposition is deferred at this time, awaiting improvement of current medical problems.  Anticipated discharge pending placement. Appreciate SW assistance. We discussed possible transfer to [facility] if possible if he will need continued inpatient treatment and possible start of radiation therapy. Will need to discuss further with social work and rad-onc.    The patient does have a current PCP (Pcp Not In System) and does need an [facility] hospital follow-up appointment after discharge.  The patient does have transportation limitations that hinder transportation to clinic appointments.  Services Needed at time of discharge: Y = Yes, Blank = No PT:   OT:   RN:   Equipment:   Other:     LOS: 6 days   Wilber Oliphant, MD 04/01/2015, 7:47 AM

## 2015-04-02 ENCOUNTER — Encounter: Payer: Self-pay | Admitting: Nutrition

## 2015-04-02 ENCOUNTER — Encounter: Payer: Self-pay | Admitting: *Deleted

## 2015-04-02 ENCOUNTER — Telehealth: Payer: Self-pay | Admitting: *Deleted

## 2015-04-02 DIAGNOSIS — F17210 Nicotine dependence, cigarettes, uncomplicated: Secondary | ICD-10-CM

## 2015-04-02 LAB — BASIC METABOLIC PANEL
Anion gap: 8 (ref 5–15)
BUN: <5 mg/dL — ABNORMAL LOW (ref 6–23)
CHLORIDE: 93 mmol/L — AB (ref 96–112)
CO2: 34 mmol/L — AB (ref 19–32)
CREATININE: 0.57 mg/dL (ref 0.50–1.35)
Calcium: 9 mg/dL (ref 8.4–10.5)
GFR calc Af Amer: >90 mL/min (ref >90)
GFR calc non Af Amer: >90 mL/min (ref >90)
GLUCOSE: 117 mg/dL — AB (ref 70–99)
POTASSIUM: 3.4 mmol/L — AB (ref 3.5–5.1)
SODIUM: 135 mmol/L (ref 135–145)

## 2015-04-02 LAB — GLUCOSE, CAPILLARY
Glucose-Capillary: 102 mg/dL — ABNORMAL HIGH (ref 70–99)
Glucose-Capillary: 113 mg/dL — ABNORMAL HIGH (ref 70–99)
Glucose-Capillary: 116 mg/dL — ABNORMAL HIGH (ref 70–99)
Glucose-Capillary: 122 mg/dL — ABNORMAL HIGH (ref 70–99)

## 2015-04-02 LAB — MAGNESIUM: Magnesium: 1.6 mg/dL (ref 1.5–2.5)

## 2015-04-02 MED ORDER — WHITE PETROLATUM GEL
Status: DC | PRN
  Filled 2015-04-02 (×2): qty 56.7

## 2015-04-02 MED ORDER — LACTATED RINGERS IV SOLN
INTRAVENOUS | Status: DC
  Administered 2015-04-02: 13:00:00 via INTRAVENOUS

## 2015-04-02 MED ORDER — MAGNESIUM SULFATE 2 GM/50ML IV SOLN
2.0000 g | Freq: Once | INTRAVENOUS | Status: AC
  Administered 2015-04-02: 2 g via INTRAVENOUS
  Filled 2015-04-02: qty 50

## 2015-04-02 MED ORDER — POTASSIUM CHLORIDE 10 MEQ/100ML IV SOLN
10.0000 meq | INTRAVENOUS | Status: AC
  Administered 2015-04-02 (×3): 10 meq via INTRAVENOUS
  Filled 2015-04-02: qty 100

## 2015-04-02 NOTE — Progress Notes (Signed)
Speech Language Pathology Treatment: Nada Boozer Speaking valve (pt declined POs)  Patient Details Name: Kyle Alexander MRN: [id number] DOB: 1960-12-20 Today's Date: 04/02/2015 Time: 7680-8811 SLP Time Calculation (min) (ACUTE ONLY): 17 min  Assessment / Plan / Recommendation Clinical Impression  Upon SLP arrival, pt was using dry erase board to communicate with RN. SLP encouraged pt to use PMV to facilitate communication. Pt wore the valve for ~15 minutes with good tolerance. Vocal quality remains low in intensity and wet due to difficulty managing secretions. Pt declined oral care and ice chip trials this afternoon. Mod cues were provided throughout treatment for repetitions to increase intelligibility. RT arrived and pt requested PMV be removed for deep suctioning. Note that plans are for transfer to [facility] - pt will continue to require SLP f/u.   HPI HPI: Kyle Alexander is a 54 y.o. male with squamous cell carcinoma of oropharynx, hypopharynx, and larynx S/p tracheostomy 4/14 and multiple dental extractions on 4/14.   Pertinent Vitals Pain Assessment: 0-10 Pain Score: 10-Worst pain ever Pain Location: g-tube Pain Descriptors / Indicators: Aching;Constant Pain Intervention(s): Limited activity within patient's tolerance;Monitored during session;Other (comment) (reports RN aware, not yet time for meds)  SLP Plan  Continue with current plan of care    Recommendations Diet recommendations: NPO Medication Administration: Via alternative means      Patient may use Passy-Muir Speech Valve: During all waking hours (remove during sleep) PMSV Supervision: Intermittent MD: Please consider changing trach tube to : Smaller size;Cuffless       Oral Care Recommendations: Oral care Q4 per protocol;Oral care prior to ice chips Follow up Recommendations: Skilled Nursing facility Plan: Continue with current plan of care    Germain Osgood, M.A. CCC-SLP [phone]   Germain Osgood 04/02/2015,  3:34 PM

## 2015-04-02 NOTE — Progress Notes (Signed)
Report called to RN on 3W at [facility].

## 2015-04-02 NOTE — Progress Notes (Signed)
[place] Work  Clinical Social Work met with patient's sister, Joanthony Hamza, in [place] office and has communicated via telephone.  CSW also spoke with patient's brother Roderic Palau.  CSW has completed [employer]AT paperwork for transportation once he begins treatment.    CSW and patient's sister discussed patient's emotional responses to diagnosis/prognosis, barriers to care, and possible resources to support patient/family.  Mr. Mintzer identifies his siblings and ex-significant other as his main supports.  Patient's siblings share his ex-significant other does not wish to be involved.  CSW will follow up with patient to discuss healthcare advance directives.    CSW will continue to follow up with patient/family throughout treatment.    Polo Riley, MSW, LCSW, OSW-C Clinical Social Worker [facility] [phone]

## 2015-04-02 NOTE — Progress Notes (Signed)
PT Cancellation Note  Patient Details Name: MILFORD CILENTO MRN: [id number] DOB: 1961-10-27   Cancelled Treatment:    Reason Eval/Treat Not Completed: Patient declined, no reason specified Patient declines working with therapy today. States he would rather rest prior to transferring to [facility]. Will follow until d/c.  Ellouise Newer 04/02/2015, 1:50 PM Elayne Snare, [place]

## 2015-04-02 NOTE — Clinical Social Work Note (Signed)
Patient transferred to [facility] via [place], this CSW to sign off.  Jones Broom. [place], MSW, [place] 04/02/2015 5:52 PM

## 2015-04-02 NOTE — Telephone Encounter (Signed)
Called 6N to obtain update on patient, spoke with his nurse, Arbie Cookey. 1. She reported:  He is progressing well since dental extractions, trach placement and PEG placement, beginning to successfully use Passy-Muir speech valve.  He is tolerating continuous feed nutrition at 30 cc/hr, to be advanced to 40 cc/hr this afternoon.  Family has been very supportive, presently at bedside. 2. She verbalized understanding of 8:00 CT SIM at [facility].  She indicated [facility] transport will be arranged. 3. During my follow-up conversation with Arbie Cookey re: IV gauge needed for [facility], she indicated attending Dr. Naaman Plummer is arranging patient transfer to [facility]. 4. Dr. Naaman Plummer later called to indicate she has been in conversation with Dr. Rockne Menghini, transfer is being arranged for this afternoon. 5. I provided update to Dr. Isidore Moos and CT SIM.  Gayleen Orem, RN, BSN, [place] at [place] [phone]

## 2015-04-02 NOTE — Progress Notes (Signed)
2 Days Post-Op  Subjective: Complains of some minor incisional pain, tol tube feeds  Objective: Vital signs in last 24 hours: Temp:  [98.4 F (36.9 C)-99 F (37.2 C)] 98.9 F (37.2 C) (04/21 0538) Pulse Rate:  [90-107] 98 (04/21 0538) Resp:  [16-18] 16 (04/21 0538) BP: (110-128)/(72-90) 110/72 mmHg (04/21 0538) SpO2:  [93 %-100 %] 96 % (04/21 0538) FiO2 (%):  [28 %] 28 % (04/21 0538) Weight:  [60.5 kg (133 lb 6.1 oz)-62.4 kg (137 lb 9.1 oz)] 62.4 kg (137 lb 9.1 oz) (04/21 0538) Last BM Date: 03/31/15  Intake/Output from previous day: 04/20 0701 - 04/21 0700 In: 490 [I.V.:350; NG/GT:140] Out: 1420 [Urine:1400; Drains:20] Intake/Output this shift:    GI: soft approp tender wound clean bs present g tube functional  Lab Results:   Recent Labs  03/31/15 0530 04/01/15 0520  WBC 9.3 10.5  HGB 9.7* 10.6*  HCT 28.6* 30.9*  PLT 189 225   BMET  Recent Labs  04/01/15 0520 04/02/15 0628  NA 133* 135  K 3.8 3.4*  CL 94* 93*  CO2 25 34*  GLUCOSE 92 117*  BUN <5* <5*  CREATININE 0.66 0.57  CALCIUM 8.9 9.0   PT/INR No results for input(s): LABPROT, INR in the last 72 hours. ABG No results for input(s): PHART, HCO3 in the last 72 hours.  Invalid input(s): PCO2, PO2  Studies/Results: No results found.  Anti-infectives: Anti-infectives    Start     Dose/Rate Route Frequency Ordered Stop   03/30/15 0600  ceFAZolin (ANCEF) IVPB 2 g/50 mL premix    Comments:  Send with pt to IR for procedure, time unknown yet.   2 g 100 mL/hr over 30 Minutes Intravenous On call 03/29/15 0739 03/30/15 1410   03/26/15 0730  ceFAZolin (ANCEF) IVPB 2 g/50 mL premix     2 g 100 mL/hr over 30 Minutes Intravenous On call to O.R. 03/26/15 0727 03/26/15 1126   03/26/15 0000  ceFAZolin (ANCEF) IVPB 2 g/50 mL premix  Status:  Discontinued     2 g 100 mL/hr over 30 Minutes Intravenous  Once 03/25/15 1339 03/26/15 1619      Assessment/Plan: POD# 2 open gastrostomy tube placement Remove  dressing May use g tube as you see fit and as needed at this point Will need appt about pod 10 in our office for staple and stitch removal, nurse only  [facility] 04/02/2015

## 2015-04-02 NOTE — Discharge Instructions (Signed)
MOUTH CARE AFTER SURGERY ° °FACTS: °· Ice used in ice bag helps keep the swelling down, and can help lessen the pain. °· It is easier to treat pain BEFORE it happens. °· Spitting disturbs the clot and may cause bleeding to start again, or to get worse. °· Smoking delays healing and can cause complications. °· Sharing prescriptions can be dangerous.  Do not take medications not recently prescribed for you. °· Antibiotics may stop birth control pills from working.  Use other means of birth control while on antibiotics. °· Warm salt water rinses after the first 24 hours will help lessen the swelling:  Use 1/2 teaspoonful of table salt per oz.of water. ° °DO NOT: °· Do not spit.  Do not drink through a straw. °· Strongly advised not to smoke, dip snuff or chew tobacco at least for 3 days. °· Do not eat sharp or crunchy foods.  Avoid the area of surgery when chewing. °· Do not stop your antibiotics before your instructions say to do so. °· Do not eat hot foods until bleeding has stopped.  If you need to, let your food cool down to room temperature. ° °EXPECT: °· Some swelling, especially first 2-3 days. °· Soreness or discomfort in varying degrees.  Follow your dentist's instructions about how to handle pain before it starts. °· Pinkish saliva or light blood in saliva, or on your pillow in the morning.  This can last around 24 hours. °· Bruising inside or outside the mouth.  This may not show up until 2-3 days after surgery.  Don't worry, it will go away in time. °· Pieces of "bone" may work themselves loose.  It's OK.  If they bother you, let us know. ° °WHAT TO DO IMMEDIATELY AFTER SURGERY: °· Bite on the gauze with steady pressure for 1-2 hours.  Don't chew on the gauze. °· Do not lie down flat.  Raise your head support especially for the first 24 hours. °· Apply ice to your face on the side of the surgery.  You may apply it 20 minutes on and a few minutes off.  Ice for 8-12 hours.  You may use ice up to 24  hours. °· Before the numbness wears off, take a pain pill as instructed. °· Prescription pain medication is not always required. ° °SWELLING: °· Expect swelling for the first couple of days.  It should get better after that. °· If swelling increases 3 days or so after surgery; let us know as soon as possible. ° °FEVER: °· Take Tylenol every 4 hours if needed to lower your temperature, especially if it is at 100F or higher. °· Drink lots of fluids. °· If the fever does not go away, let us know. ° °BREATHING TROUBLE: °· Any unusual difficulty breathing means you have to have someone bring you to the emergency room ASAP ° °BLEEDING: °· Light oozing is expected for 24 hours or so. °· Prop head up with pillows °· Avoid spitting °· Do not confuse bright red fresh flowing blood with lots of saliva colored with a little bit of blood. °· If you notice some bleeding, place gauze or a tea bag where it is bleeding and apply CONSTANT pressure by biting down for 1 hour.  Avoid talking during this time.  Do not remove the gauze or tea bag during this hour to "check" the bleeding. °· If you notice bright RED bleeding FLOWING out of particular area, and filling the floor of your mouth, put   a wad of gauze on that area, bite down firmly and constantly.  Call us immediately.  If we're closed, have someone bring you to the emergency room.  ORAL HYGIENE:  Brush your teeth as usual after meals and before bedtime.  Use a soft toothbrush around the area of surgery.  DO NOT AVOID BRUSHING.  Otherwise bacteria(germs) will grow and may delay healing or encourage infection.  Since you cannot spit, just gently rinse and let the water flow out of your mouth.  DO NOT SWISH HARD.  EATING:  Cool liquids are a good point to start.  Increase to soft foods as tolerated.  PRESCRIPTIONS:  Follow the directions for your prescriptions exactly as written.  If Dr. Enrique Sack gave you a narcotic pain medication, do not drive, operate  machinery or drink alcohol when on that medication.  QUESTIONS:  Call our office during office hours [phone] or call the Emergency Room at [phone].  Gastrostomy Tube Home Guide A gastrostomy tube is a tube that is surgically placed into the stomach. It is also called a "G-tube." G-tubes are used when a person is unable to eat and drink enough on their own to stay healthy. The tube is inserted into the stomach through a small cut (incision) in the skin. This tube is used for:  Feeding.  Giving medication. GASTROSTOMY TUBE CARE  Wash your hands with soap and water.  Remove the old dressing (if any). Some styles of G-tubes may need a dressing inserted between the skin and the G-tube. Other types of G-tubes do not require a dressing. Ask your health care provider if a dressing is needed.  Check the area where the tube enters the skin (insertion site) for redness, swelling, or pus-like (purulent) drainage. A small amount of clear or tan liquid drainage is normal. Check to make sure scar tissue (skin) is not growing around the insertion site. This could have a raised, bumpy appearance.  A cotton swab can be used to clean the skin around the tube:  When the G-tube is first put in, a normal saline solution or water can be used to clean the skin.  Mild soap and warm water can be used when the skin around the G-tube site has healed.  Roll the cotton swab around the G-tube insertion site to remove any drainage or crusting at the insertion site. STOMACH RESIDUALS Feeding tube residuals are the amount of liquids that are in the stomach at any given time. Residuals may be checked before giving feedings, medications, or as instructed by your health care provider.  Ask your health care provider if there are instances when you would not start tube feedings depending on the amount or type of contents withdrawn from the stomach.  Check residuals by attaching a syringe to the G-tube and pulling  back on the syringe plunger. Note the amount, and return the residual back into the stomach. FLUSHING THE G-TUBE  The G-tube should be periodically flushed with clean warm water to keep it from clogging.  Flush the G-tube after feedings or medications. Draw up 30 mL of warm water in a syringe. Connect the syringe to the G-tube and slowly push the water into the tube.  Do not push feedings, medications, or flushes rapidly. Flush the G-tube gently and slowly.  Only use syringes made for G-tubes to flush medications or feedings.  Your health care provider may want the G-tube flushed more often or with more water. If this is the case, follow your health  care provider's instructions. FEEDINGS Your health care provider will determine whether feedings are given as a bolus (a certain amount given at one time and at scheduled times) or whether feedings will be given continuously on a feeding pump.   Formulas should be given at room temperature.  If feedings are continuous, no more than 4 hours worth of feedings should be placed in the feeding bag. This helps prevent spoilage or accidental excess infusion.  Cover and place unused formula in the refrigerator.  If feedings are continuous, stop the feedings when medications or flushes are given. Be sure to restart the feedings.  Feeding bags and syringes should be replaced as instructed by your health care provider. GIVING MEDICATION   In general, it is best if all medications are in a liquid form for G-tube administration. Liquid medications are less likely to clog the G-tube.  Mix the liquid medication with 30 mL (or amount recommended by your health care provider) of warm water.  Draw up the medication into the syringe.  Attach the syringe to the G-tube and slowly push the mixture into the G-tube.  After giving the medication, draw up 30 mL of warm water in the syringe and slowly flush the G-tube.  For pills or capsules, check with your  health care provider first before crushing medications. Some pills are not effective if they are crushed. Some capsules are sustained-release medications.  If appropriate, crush the pill or capsule and mix with 30 mL of warm water. Using the syringe, slowly push the medication through the tube, then flush the tube with another 30 mL of tap water. G-TUBE PROBLEMS G-tube was pulled out.  Cause: May have been pulled out accidentally.  Solutions: Cover the opening with clean dressing and tape. Call your health care provider right away. The G-tube should be put in as soon as possible (within 4 hours) so the G-tube opening (tract) does not close. The G-tube needs to be put in at a health care setting. An X-ray needs to be done to confirm placement before the G-tube can be used again. Redness, irritation, soreness, or foul odor around the gastrostomy site.  Cause: May be caused by leakage or infection.  Solutions: Call your health care provider right away. Large amount of leakage of fluid or mucus-like liquid present (a large amount means it soaks clothing).  Cause: Many reasons could cause the G-tube to leak.  Solutions: Call your health care provider to discuss the amount of leakage. Skin or scar tissue appears to be growing where tube enters skin.   Cause: Tissue growth may develop around the insertion site if the G-tube is moved or pulled on excessively.  Solutions: Secure tube with tape so that excess movement does not occur. Call your health care provider. G-tube is clogged.  Cause: Thick formula or medication.  Solutions: Try to slowly push warm water into the tube with a large syringe. Never try to push any object into the tube to unclog it. Do not force fluid into the G-tube. If you are unable to unclog the tube, call your health care provider right away. TIPS  Head of bed (HOB) position refers to the upright position of a person's upper body.  When giving medications or a feeding  bolus, keep the [facility] up as told by your health care provider. Do this during the feeding and for 1 hour after the feeding or medication administration.  If continuous feedings are being given, it is best to keep the [facility]  up as told by your health care provider. When ADLs (activities of daily living) are performed and the [facility] needs to be flat, be sure to turn the feeding pump off. Restart the feeding pump when the [facility] is returned to the recommended height.  Do not pull or put tension on the tube.  To prevent fluid backflow, kink the G-tube before removing the cap or disconnecting a syringe.  Check the G-tube length every day. Measure from the insertion site to the end of the G-tube. If the length is longer than previous measurements, the tube may be coming out. Call your health care provider if you notice increasing G-tube length.  Oral care, such as brushing teeth, must be continued.  You may need to remove excess air (vent) from the G-tube. Your health care provider will tell you if this is needed.  Always call your health care provider if you have questions or problems with the G-tube. SEEK IMMEDIATE MEDICAL CARE IF:   You have severe abdominal pain, tenderness, or abdominal bloating (distension).  You have nausea or vomiting.  You are constipated or have problems moving your bowels.  The G-tube insertion site is red, swollen, has a foul smell, or has yellow or brown drainage.  You have difficulty breathing or shortness of breath.  You have a fever.  You have a large amount of feeding tube residuals.  The G-tube is clogged and cannot be flushed. MAKE SURE YOU:   Understand these instructions.  Will watch your condition.  Will get help right away if you are not doing well or get worse. Document Released: 02/06/2002 Document Revised: 04/14/2014 Document Reviewed: 08/05/2013 [facility] Patient Information 2015 [place], [place]. This information is not intended to replace advice  given to you by your health care provider. Make sure you discuss any questions you have with your health care provider.

## 2015-04-02 NOTE — Trach Care Team (Signed)
[place] Progression Note   Patient Details Name: Kyle Alexander MRN: [id number] DOB: 1961/07/20 Today's Date: 04/02/2015   Tracheostomy Assessment    Tracheostomy Shiley 4 mm Uncuffed (Active)  Status Secured;Passy Muir Speaking valve 04/02/2015 11:45 AM  Site Assessment Oozing secretions 04/02/2015 11:45 AM  Site Care Dried;Cleansed;Dressing applied 04/02/2015 11:45 AM  Inner Cannula Care Cleansed/dried 04/01/2015  3:50 PM  Ties Assessment Secure;Dry 04/02/2015 11:45 AM  Cuff pressure (cm) 0 cm 04/02/2015 11:45 AM  Emergency Equipment at bedside Yes 04/02/2015 11:45 AM     Care Needs     Respiratory Therapy O2 Device: Tracheostomy Collar FiO2 (%): 28 % SpO2: 98 %    Speech Language Pathology  Patient may use Passy-Muir Speech Valve: During all therapies with supervision, During PO intake/meals, Intermittently with supervision PMSV Supervision: Intermittent Follow up Recommendations: Skilled Nursing facility   Physical Therapy Ambulation/Gait assistance: Min guard PT Recommendation/Assessment: Patient needs continued PT services Follow Up Recommendations: SNF PT equipment: None recommended by PT    Occupational Therapy OT Recommendation/Assessment: Patient needs continued OT Services Follow Up Recommendations: SNF, Supervision/Assistance - 24 hour OT Equipment: Other (comment) (defer to SNF)    Nutritional Patient's Current Diet: NPO Diet Recommendations: On continuous TF via PEG    Case Management/Social Work      Statistician Care Team/Provider            Recommendations [facility] Team Members Present-  Ciro Backer, RT, Alvino Blood, SW    No needs at present. Discharge plan in progress to trach SNF.           Dinah Lupa, Jaci Carrel (scribe for team) 04/02/2015, 1:17 PM

## 2015-04-02 NOTE — Progress Notes (Signed)
Occupational Therapy Treatment Patient Details Name: Kyle Alexander MRN: [id number] DOB: 05/08/1961 Today's Date: 04/02/2015    History of present illness Kyle Alexander is a 54 y.o. male with squamous cell carcinoma of oropharynx, hypopharynx, and larynx S/p tracheostomy 4/14 and multiple dental extractions on 4/14.   OT comments  With encouragement, pt agreeable only to UE exercise bed level, but only tolerated minimally due to abdominal pain and fatigue.  Will continue to follow.   Follow Up Recommendations  SNF;Supervision/Assistance - 24 hour    Equipment Recommendations  Other (comment)    Recommendations for Other Services      Precautions / Restrictions Precautions Precautions: None Precaution Comments: trach       Mobility Bed Mobility                  Transfers                      Balance                                   ADL                                                Vision                     Perception     Praxis      Cognition   Behavior During Therapy: WFL for tasks assessed/performed Overall Cognitive Status: Within Functional Limits for tasks assessed                       Extremity/Trunk Assessment               Exercises Other Exercises Other Exercises: With encouragement, pt agreeable to UE exercises only at bed level.  Pt performed 10 reps shoulder flexion/ext and elbow flex and ext with minimal manual resistance.  Attempted abduction, but pt only able to tolerate 2 reps before complaining of abdominal pain being too severe and feeling "wiped out".      Shoulder Instructions       General Comments      Pertinent Vitals/ Pain       Pain Assessment: 0-10 Pain Score: 10-Worst pain ever Pain Location: G-tube site Pain Descriptors / Indicators: Aching;Constant Pain Intervention(s): Limited activity within patient's tolerance (Pt reports nurse aware, not  time for meds )  Home Living                                          Prior Functioning/Environment              Frequency Min 2X/week     Progress Toward Goals  OT Goals(current goals can now be found in the care plan section)  Progress towards OT goals: Not progressing toward goals - comment (fatigue and pain )     Plan Discharge plan remains appropriate    Co-evaluation                 End of Session Equipment Utilized During Treatment: Oxygen   Activity Tolerance No increased pain;Patient limited by fatigue  Patient Left in bed;with call bell/phone within reach;with family/visitor present   Nurse Communication          Time: 4287-6811 OT Time Calculation (min): 11 min  Charges: OT General Charges $OT Visit: 1 Procedure OT Treatments $Therapeutic Exercise: 8-22 mins  Kyle Alexander 04/02/2015, 2:50 PM

## 2015-04-02 NOTE — Progress Notes (Signed)
Subjective: Patient resting comfortably in bed. Complaining of right-sided jaw pain and abdominal pain around incision site. Denies any flatulence or BMs since gastrostomy placement two days ago. Also requesting vasoline for chapped lips.   Objective: Vital signs in last 24 hours: Filed Vitals:   04/02/15 0104 04/02/15 0403 04/02/15 0536 04/02/15 0538  BP:    110/72  Pulse: 105  107 98  Temp:    98.9 F (37.2 C)  TempSrc:      Resp: 18 18 18 16   Height:      Weight:    62.4 kg (137 lb 9.1 oz)  SpO2: 93% 99% 95% 96%   Weight change:   Intake/Output Summary (Last 24 hours) at 04/02/15 0730 Last data filed at 04/02/15 0600  Gross per 24 hour  Intake    490 ml  Output   1420 ml  Net   -930 ml   General: resting in bed HEENT: EOMI, jaw swollen, gums nonerythematous, sutures mostly dissolved with exception of right upper molar  Cardiac: RRR, no rubs, murmurs or gallops Pulm: clear to auscultation bilaterally, moving normal volumes of air Abd: firm, nondistended, mildly tender in LUQ near G-tube, incision is clean and dry, no bowel sounds.   Ext: warm and well perfused, no pedal edema Neuro: alert and oriented X3, cranial nerves II-XII grossly intact  Lab Results: Basic Metabolic Panel:  Recent Labs  03/31/15 0530 04/01/15 0520  NA 135 133*  K 3.3* 3.8  CL 95* 94*  CO2 29 25  GLUCOSE 77 92  BUN <5* <5*  CREATININE 0.52 0.66  CALCIUM 8.7 8.9  MG  --  1.3*   CBC:  Recent Labs  03/31/15 0530 04/01/15 0520  WBC 9.3 10.5  HGB 9.7* 10.6*  HCT 28.6* 30.9*  MCV 96.3 95.1  PLT 189 225  CBG:  Recent Labs  04/01/15 1615 04/01/15 2000 04/02/15 0028 04/02/15 0411  GLUCAP 87 101* 102* 116*   Urine Drug Screen: Drugs of Abuse     Component Value Date/Time   LABOPIA NONE DETECTED 03/15/2015 1533   COCAINSCRNUR NONE DETECTED 03/15/2015 1533   LABBENZ NONE DETECTED 03/15/2015 1533   AMPHETMU NONE DETECTED 03/15/2015 1533   THCU POSITIVE* 03/15/2015 1533   LABBARB NONE DETECTED 03/15/2015 1533    Medications: I have reviewed the patient's current medications. Scheduled Meds: . chlorhexidine  15 mL Mouth/Throat BID  . enoxaparin (LOVENOX) injection  40 mg Subcutaneous Q24H  . ergocalciferol  50,000 Units Oral Weekly  . fentaNYL  50 mcg Transdermal Q72H  . folic acid  1 mg Intravenous Daily  . multivitamin  5 mL Oral Daily  . nicotine  21 mg Transdermal Daily  . scopolamine  1 patch Transdermal Q72H  . thiamine  100 mg Oral Daily   Or  . thiamine  100 mg Intravenous Daily   Continuous Infusions: . feeding supplement (OSMOLITE 1.2 CAL) 1,000 mL (04/02/15 0300)  . lactated ringers 75 mL/hr at 03/30/15 2132  . lactated ringers Stopped (03/28/15 1914)  . lactated ringers 50 mL/hr (04/02/15 0355)  . sodium chloride irrigation     PRN Meds:.HYDROmorphone (DILAUDID) injection, ondansetron (ZOFRAN) IV, sodium chloride Assessment/Plan: Active Problems:   HTN (hypertension)   Tobacco abuse   Alcohol abuse   Hypokalemia   Marijuana abuse   Folate deficiency   Protein-calorie malnutrition, severe   Vitamin D deficiency   Squamous cell carcinoma of supraglottis   Head and neck cancer   Pharyngeal cancer  Hypomagnesemia   Tracheostomy care  T4N2C Squamous Cell Carcinoma of Oropharynx, Hypopharynx, and Larynx s/p tracheostomy and multiple dental extractions on 4/14 and s/p gastrostomy placement with general surgery on 4/19 - Currently on 28% automatic tube compensation trach collar. Did not tolerate trial of RA (desaturation to 88-89%). PT/OT evaluated, recommending SNF though current pain, nutrition, and trach management may require higher level of care than that available at SNF.Trach changed to #4 cuffless on 4/20.  - per surgery, tube is ready for use. Will need appointment 10 days post-op for staple removal (4/29) - ENT following - con't trach and oral care - Surgery following, appreciate recs - per SLP, keep NPO advance TF today, @  53ml/hr, advance to 52ml/hr in PM. If successful tube feeds, can d/c fluids - PT/OT rec SNF, patient may require higher level of care for pain and trach management -PET scan scheduled for April 22, will need to be OP - radiation simulation scheduled for April 22, can be done as inpatient @ [facility] - fentanyl patch 56mcg q72 hours - IV hydromorphone 1mg  q4hrs PRN (severe pain) -Chlorhexidine rinses BID and salt rinses Q 2 hr in between rinses  -Dental suture removal 1-3 days (self-dissolvable)  New tracheostomy s/p trach downsize and intermittent PMV use: S/p trach downsize on 4/20 (#4 cuffless trach). Patient appears to be more comfortable with managing secretions. Scopoloamine patch placed on 4/20. Pt was able to increase vocal intensity for improved intelligibility. Patient tolerated PMV x51minutes with SLP 4/20. NPO per SLP recommendations.  - ENT following - changed to #4 cuffless trach 4/20, con't trach and oral care - SLP following, continue to recommend NPO with ice chips PRN after oral care with PMV in place. - continue intermittent PMV use as tolerated  - no velcro ties for at least 1 week post-op  Severe malnutrition: Prealbumin 11 on 4/3.Nutrition evaluated on 4/20. Tolerating TF well, @ 45ml/hr on 4/21 AM, advancing to 77ml/hr in PM. - initiate osmolite 1.2 @ 20 ml/hr via G-tube and increase by 10 ml every 12 hours to goal rate of 65 ml/hr.  -Tube feeding regimen provides 1872 kcal (100% of needs), 87 grams of protein, and 1279 ml of H2O.  -Monitor Mg, K, and Phos daily x 3 days due to pt's high risk of refeeding syndrome - d/c resource breeze 1 can TID   Hypomagnesemia - resolved: Mg 1.6 in setting of chronic alcohol abuse s/p repletion  Hypertension - Currently normotensive. Pt not on antihypertensives at home. -Consider adding amlodipine 5 mg daily if uncontrolled  Normocytic anemia - resolved - Hg stable 10.6 from 13.9 on admission with no active bleeding or hemodynamic  instability. Pt with no prior history of anemia with 100 mL EBL from recent dental extraction (4/16) and minimal blood loss from gastrostomy on 4/19. -Monitor for bleeding and transfuse if <7 - FOBT pending  Tobacco Abuse - Pt with 1-2 pack history for approx past 30 years.  -Continue nicotine 21 mg patch daily  -Pt counseled on tobacco cessation   Alcohol Abuse - Date of last drink unclear. History of remote alcohol-withdrawal seizures.  -Continue CIWA protocol  -Counsel on cessation  Vitamin D Deficiency - Level < 4 on 03/15/15. -Continue liquid ergocalciferol 50 K U weekly (Week 2/8)  Folate Deficiency - Folate level low at 3.2 on 03/15/15.  -Continue IV folate 1 mg daily   Marijuana Abuse - Pt reports smoking THC 1-2 times weekly.  -Pt counseled on cessation   Thoracic Aortic  Dilation - 4 cm ascending aortic dilation seen on CTA chest. -Needs follow-up CTA or MRA in 1 year  Prolonged QT - EKG on 03/15/15 with QTc of 470 in setting of hypomagnesemia, now resolved and chronic alcohol abuse.  -Avoid prolonging QT medications    Diet: NPO DVT Ppx: Lovenox & SCDs Code: Full   Dispo: Disposition is deferred at this time, awaiting improvement of current medical problems. Patient would benefit from closer management than may be available at SNF in setting of nutritional deficiency, managing new tracheostomy, and unresolved pain. Will work to arrange transfer to [facility] in advance of planned radiation simulation on 4/21.  The patient does have a current PCP (Pcp Not In System) and does need an [facility] hospital follow-up appointment after discharge.  The patient does have transportation limitations that hinder transportation to clinic appointments. .Services Needed at time of discharge: Y = Yes, Blank = No PT:   OT:   RN:   Equipment:   Other:     LOS: 7 days   Carolan Shiver, Med Student 04/02/2015, 7:30 AM

## 2015-04-02 NOTE — Progress Notes (Signed)
Pt being transferred to [facility] 1325. Report called to RT at [facility].

## 2015-04-02 NOTE — Progress Notes (Signed)
TRANSFER NOTE  Subjective:  He was recently hospitalized from 4/3-5 for 21-month history of progressive dysphagia, voice hoarseness, weight loss, and enlarging neck masses in setting of chronic alcohol and tobacco abuse with lymph node biopsy results compatible with squamous cell carcinoma of oropharynx, hypopharynx, and larynx. He was to have outpatient g-tube placement and PET scan before starting of radiation therapy however could not tolerate lying flat and was consequently admitted for tracheostomy and g-tube placement (placed by general surgery). Pt was admitted on 03/10/91 by ENT (Dr. [name]) for tracheostomy placement and had multiple tooth extractions by dentistry (Dr. Enrique Sack DDS) for which he required ICU observation overnight. He had g-tube placement by general surgery on 4/19 (Dr. Donne Hazel). He has been doing well since admission but still unable to tolerate PO intake.   Tomorrow he is to have radiation stimulation test at [facility] at 8:00 AM. PET scan was originally scheduled as outpatient for tomorrow at 9:30 AM.  Today he is doing well and is still NPO per speech evaluation recommendations yesterday. He has started tube feedings and has minimal output from his gtube. His pain is better controlled with fentanyl patch in addition to dilaudid.   He appears comfortable and has cuffless trach collar in place with normal oxygen saturation and is in no respiratory distress. He is trying to use passy muir valve and is starting to speak.     He reports no passing of gas since his surgery. He is making normal urine output.    Objective: Vital signs in last 24 hours: Filed Vitals:   04/02/15 0104 04/02/15 0403 04/02/15 0536 04/02/15 0538  BP:    110/72  Pulse: 105  107 98  Temp:    98.9 F (37.2 C)  TempSrc:      Resp: 18 18 18 16   Height:      Weight:     137 lb 9.1 oz (62.4 kg)  SpO2: 93% 99% 95% 96%   Weight change:   Intake/Output Summary (Last 24 hours) at 04/02/15 0731 Last data filed at 04/02/15 0600  Gross per 24 hour  Intake    490 ml  Output   1420 ml  Net   -930 ml   PHYSICAL EXAMINATION: General: NAD, cachectic appearing  HEENT:  Edentulous. Jaw swollen. Right upper molar suture in place Bilateral cervical lymphadenopathy.  Lungs: Trach collar in place. Clear to ausculation bilaterally. No wheezing, rhonchi, or rales.  Heart: Normal rate and rhythm  Abdomen: Soft, non-distended, mildly tender in LUQ. Normal BS. G-tube in place and incision site with no signs of infection. Extremities: No edema Neuro: A& O x 3.    Lab Results: Basic Metabolic Panel:  Recent Labs Lab 03/27/15 0256  03/30/15 0345 03/31/15 0530 04/01/15 0520  NA 136  < >  135 135 133*  K 4.6  < > 3.1* 3.3* 3.8  CL 96  < > 96 95* 94*  CO2 27  < > 31 29 25   GLUCOSE 83  < > 107* 77 92  BUN 7  < > <5* <5* <5*  CREATININE 0.91  < > 0.51 0.52 0.66  CALCIUM 9.6  < > 9.0 8.7 8.9  MG 1.3*  < > 1.4*  --  1.3*  PHOS 3.1  --   --   --   --   < > = values in this interval not displayed. Liver Function Tests:  Recent Labs Lab 03/27/15 0256  AST 17  ALT 9  ALKPHOS 59  BILITOT 1.2  PROT 6.4  ALBUMIN 2.4*   CBC:  Recent Labs Lab 03/27/15 0256  03/31/15 0530 04/01/15 0520  WBC 14.2*  < > 9.3 10.5  NEUTROABS 11.9*  --   --   --   HGB 10.7*  < > 9.7* 10.6*  HCT 32.0*  < > 28.6* 30.9*  MCV 97.6  < > 96.3 95.1  PLT 257  < > 189 225  < > = values in this interval not displayed. CBG:  Recent Labs Lab 03/26/15 1437 04/01/15 1615 04/01/15 2000 04/02/15 0028 04/02/15 0411  GLUCAP 114* 87 101* 102* 116*   Urine Drug Screen: Drugs of Abuse     Component Value Date/Time   LABOPIA NONE DETECTED 03/15/2015 1533   COCAINSCRNUR NONE DETECTED 03/15/2015 1533   LABBENZ NONE DETECTED 03/15/2015 1533   AMPHETMU NONE DETECTED 03/15/2015  1533   THCU POSITIVE* 03/15/2015 1533   LABBARB NONE DETECTED 03/15/2015 1533      Studies/Results: No results found. Medications: I have reviewed the patient's current medications. Scheduled Meds: . chlorhexidine  15 mL Mouth/Throat BID  . enoxaparin (LOVENOX) injection  40 mg Subcutaneous Q24H  . ergocalciferol  50,000 Units Oral Weekly  . fentaNYL  50 mcg Transdermal Q72H  . folic acid  1 mg Intravenous Daily  . multivitamin  5 mL Oral Daily  . nicotine  21 mg Transdermal Daily  . scopolamine  1 patch Transdermal Q72H  . thiamine  100 mg Oral Daily   Or  . thiamine  100 mg Intravenous Daily   Continuous Infusions: . feeding supplement (OSMOLITE 1.2 CAL) 1,000 mL (04/02/15 0300)  . lactated ringers 75 mL/hr at 03/30/15 2132  . lactated ringers Stopped (03/28/15 1914)  . lactated ringers 50 mL/hr (04/02/15 0355)  . sodium chloride irrigation     PRN Meds:.HYDROmorphone (DILAUDID) injection, ondansetron (ZOFRAN) IV, sodium chloride Assessment/Plan:  T4N2C Squamous Cell Carcinoma of Oropharynx, Hypopharynx, and Larynx s/p tracheostomy and multiple dental extractions on 4/14 and g-tube placement on 4/19 - Currently with 98% SpO2 on trach collar with 5L O2 flow. Pt with moderately controlled pain. Tolerating tube feeds with minimal gtube output. Prealbumin 11 on 4/3.  -Appreciate ENT, dentistry, and IR recommendations -Radiation stimulation test scheduled for 8 AM tomorrow April 22, pt to be transferred to [facility] hospital (Dr. Rockne Menghini accepting physician) -Outpatient PET scan originally scheduled for 4/22 will need to be rescheduled as pt will be inpatient  -Continue osmolite 1.2 @ 20 ml/hr via G-tube and increase by 10 ml every 12 hours to goal rate of 65 ml/hr -Continue fentanyl 50 mcg patch Q 72 hr and IV dilaudid 1 mg Q 4 hr PRN -NPO (sips with meds), discontinue LR 75 mL/hr  -Zofran 4 mg Q 6 hr PRN nausea/vomiting -Chlorhexidine rinses  BID and salt rinses Q 2 hr in between rinses    -Continue scopolamine 1.5 mcg Q 72 hr secretions and add topical vaseline gel PRN  -Dental sutures to self-dissolve in 1-2, pt to have dentistry follow-up -Abdominal staple removal in 10 days (4/29) -Continue passy muir valve per speech therapy     Hypokalemia - K 3.4 this this AM in setting of hypomagnesemia and no dietary intake. Mg his AM low at 1.6. -Administer IV potassium chloride 10 mEq for 3 hrs  -Administer IV magnesium sulfate 2 g x 1  -Monitor BMP and Mg level  Acute Blood Loss Anemia - Hg 10.6 on 04/01/15 from 13.9 on admission with no active bleeding or hemodynamic instability. Pt with no prior history of anemia with 100 mL EBL from recent dental extraction. -Continue to monitor CBC -Monitor for bleeding and transfuse if <7 -Obtain FOBT  History of Hypertension - Currently normotensive. Pt not on antihypertensives at home. -Continue to monitor   Tobacco Abuse - Pt with 1-2 pack history for approx past 30 years.  -Continue nicotine 21 mg patch daily  -Pt counseled on tobacco cessation   Alcohol Abuse - Date of last drink unclear. History of remote alcohol-withdrawal seizures.   -Continue CIWA protocol  -Counsel on cessation  Vitamin D Deficiency  - Level < 4 on 03/15/15. -Continue liquid ergocalciferol 50 K U weekly (Week 3/8)  Folate Deficiency - Folate level low at 3.2 on 03/15/15.  -Continue IV folate 1 mg daily   Marijuana Abuse - Pt reports smoking THC 1-2 times weekly.  -Pt counseled on cessation   Thoracic Aortic Dilation - 4 cm ascending aortic dilation seen on CTA chest. -Needs follow-up CTA or MRA in 1 year  Prolonged QT - EKG on 03/15/15 with QTc of 470 in setting of hypomagnesemia and chronic alcohol abuse.  -Avoid prolonging QT medications  -Replete magnesium as needed   Diet: NPO DVT Ppx: Lovenox, SCD's Code: Full   Dispo: Transfer to [facility] today, admitting provider Dr. Rockne Menghini   The patient does have a current PCP (Pcp Not  In System) and does need an [facility] hospital follow-up appointment after discharge.  The patient does have transportation limitations that hinder transportation to clinic appointments.  .Services Needed at time of discharge: Y = Yes, Blank = No PT:   OT:   RN:   Equipment:   Other:     LOS: 7 days   Juluis Mire, MD 04/02/2015, 7:31 AM

## 2015-04-02 NOTE — Progress Notes (Signed)
Pt transported to [facility] by [facility]. VS stable.

## 2015-04-03 ENCOUNTER — Ambulatory Visit: Admit: 2015-04-03 | Payer: Medicaid Other | Admitting: Radiation Oncology

## 2015-04-03 ENCOUNTER — Encounter: Payer: Self-pay | Admitting: *Deleted

## 2015-04-03 ENCOUNTER — Ambulatory Visit (HOSPITAL_COMMUNITY): Payer: MEDICAID

## 2015-04-03 ENCOUNTER — Telehealth: Payer: Self-pay | Admitting: *Deleted

## 2015-04-03 ENCOUNTER — Ambulatory Visit
Admit: 2015-04-03 | Discharge: 2015-04-03 | Disposition: A | Discharge status: Home / Self Care | Payer: Medicaid Other | Attending: Radiation Oncology | Admitting: Radiation Oncology

## 2015-04-03 DIAGNOSIS — K08109 Complete loss of teeth, unspecified cause, unspecified class: Secondary | ICD-10-CM

## 2015-04-03 LAB — BASIC METABOLIC PANEL
ANION GAP: 5 (ref 5–15)
BUN: 7 mg/dL (ref 6–23)
CALCIUM: 8.8 mg/dL (ref 8.4–10.5)
CO2: 35 mmol/L — AB (ref 19–32)
CREATININE: 0.42 mg/dL — AB (ref 0.50–1.35)
Chloride: 95 mmol/L — ABNORMAL LOW (ref 96–112)
GFR calc non Af Amer: >90 mL/min (ref >90)
Glucose, Bld: 91 mg/dL (ref 70–99)
Potassium: 3.3 mmol/L — ABNORMAL LOW (ref 3.5–5.1)
SODIUM: 135 mmol/L (ref 135–145)

## 2015-04-03 LAB — CBC
HCT: 29.5 % — ABNORMAL LOW (ref 39.0–52.0)
Hemoglobin: 9.8 g/dL — ABNORMAL LOW (ref 13.0–17.0)
MCH: 32.6 pg (ref 26.0–34.0)
MCHC: 33.2 g/dL (ref 30.0–36.0)
MCV: 98 fL (ref 78.0–100.0)
Platelets: 294 10*3/uL (ref 150–400)
RBC: 3.01 MIL/uL — ABNORMAL LOW (ref 4.22–5.81)
RDW: 14.4 % (ref 11.5–15.5)
WBC: 8.5 10*3/uL (ref 4.0–10.5)

## 2015-04-03 LAB — MAGNESIUM: Magnesium: 1.7 mg/dL (ref 1.5–2.5)

## 2015-04-03 LAB — GLUCOSE, CAPILLARY
GLUCOSE-CAPILLARY: 108 mg/dL — AB (ref 70–99)
Glucose-Capillary: 101 mg/dL — ABNORMAL HIGH (ref 70–99)
Glucose-Capillary: 87 mg/dL (ref 70–99)
Glucose-Capillary: 93 mg/dL (ref 70–99)
Glucose-Capillary: 98 mg/dL (ref 70–99)

## 2015-04-03 MED ORDER — POTASSIUM CHLORIDE 10 MEQ/100ML IV SOLN
10.0000 meq | INTRAVENOUS | Status: AC
  Administered 2015-04-03 (×2): 10 meq via INTRAVENOUS
  Filled 2015-04-03 (×2): qty 100

## 2015-04-03 MED ORDER — GUAIFENESIN 100 MG/5ML PO SOLN
15.0000 mL | Freq: Four times a day (QID) | ORAL | Status: DC
  Administered 2015-04-03 – 2015-04-07 (×14): 300 mg via ORAL
  Filled 2015-04-03 (×19): qty 15

## 2015-04-03 NOTE — Progress Notes (Signed)
POST OPERATIVE NOTE:  04/03/2015   Kyle Alexander [id number]  VITALS: BP 99/65 mmHg  Pulse 76  Temp(Src) 98.9 F (37.2 C) (Oral)  Resp 18  Ht 5\' 10"  (1.778 m)  Wt 137 lb (62.143 kg)  BMI 19.66 kg/m2  SpO2 98%  LABS:  Lab Results  Component Value Date   WBC 8.5 04/03/2015   HGB 9.8* 04/03/2015   HCT 29.5* 04/03/2015   MCV 98.0 04/03/2015   PLT 294 04/03/2015   BMET    Component Value Date/Time   NA 135 04/03/2015 0435   K 3.3* 04/03/2015 0435   CL 95* 04/03/2015 0435   CO2 35* 04/03/2015 0435   GLUCOSE 91 04/03/2015 0435   BUN 7 04/03/2015 0435   CREATININE 0.42* 04/03/2015 0435   CALCIUM 8.8 04/03/2015 0435   GFRNONAA >90 04/03/2015 0435   GFRAA >90 04/03/2015 0435    Lab Results  Component Value Date   INR 1.14 03/30/2015   INR 1.08 03/15/2015   No results found for: PTT   Kyle Alexander is status post extraction remaining teeth with alveoloplasty in the operating room with general anesthesia on 03/26/2015 at [facility]. Patient had tracheostomy procedure with Dr. [name] prior to dental procedures.Patient has been recovering from the surgeries and recent feeding tube placement and was subsequently transferred [facility] long hospital-yesterday.  SUBJECTIVE: Patient indicates that his mouth is "still sore".  Patient is primarily obtaining nutrition through his feeding tube.  EXAM: There is no sign of oral infection, heme, or ooze. Sutures are still intact. Generalized primary closure is noted. The patient is edentulous.  ASSESSMENT: Post operative course is consistent with dental procedures performed in the OR on 03/26/15. The patient is edentulous.  PLAN: 1. Continue salt water rinses as needed to aid healing. 2. We will need to schedule suture removal in the dental medicine clinic for early next week. 3. Contact dental medicine if acute dental problems arise before then. 4. Patient is cleared for start of radiation therapy on  04/09/2015.  Lenn Cal, DDS

## 2015-04-03 NOTE — Progress Notes (Signed)
[facility] PROGRESS NOTE  Kyle Alexander NWG:[id number] DOB: 02-Nov-1961 DOA: 03/26/2015 PCP: Pcp Not In System  Assessment/Plan: 1. Squamous cell carcinoma of the nasopharynx, hypopharynx and larynx- status post tracheostomy and multiple dental extractions on 4/14 NG tube placement on 4/19. Patient transferred to the hospital for palliative radiation treatment. Outpatient PET scan has been scheduled for 4/22, will have to be rescheduled at the time of discharge. Continue fentanyl patch every 72 hours and when necessary Dilaudid 1 mg every 4 hours when necessary. Scopolamine patch for increased secretions.  2. Status post dental extraction- patient was seen by dental medicine this morning, recommend continuing salt water rinses to aid healing. Patient will need to schedule suture removal in dental medicine clinic early next week. Patient cleared for start of radiation therapy on 04/09/2015. 3. Nutrition- patient has PEG tube in place, continue Osmolite 1.2 at 20 mL per hour increase 10 mL every 12 hours to reach a goal of 65 mL per hour. 4. Hypokalemia- replace potassium and check BMP in a.m. 5. Anemia-  likely acute blood loss anemia hemoglobin 9.8 on 04/03/2015 dropped from 13.9 on admission. We'll check CBC in a.m. FOBT pending 6. Tobacco abuse- continue nicotine patch 7. Alcohol abuse- Continue CIWA protocol  8.  thoracic aortic dilation- patient has 4 cm ascending aortic dilation seen on the CT chest, will need follow-up CTA or MRA 1 year 9. DVT prophylaxis- Lovenox  Code Status: Full code Family Communication: No family at bedside Disposition Plan: Home when stable   Consultants:  Dentist   Procedures:  None  Antibiotics:  *None  HPI/Subjective: 54 year old male with a history of locally advanced  squamous cell carcinoma of the oropharynx, hypopharynx and larynx who was transferred from [facility]. Patient recently underwent tracheostomy as well as PEG tube  placement and now transferred to [facility] long hospital for palliative radiation. Patient found  not  a candidate for surgery or chemotherapy. This morning patient complains of coughing. He has scopolamine patch in place.  Objective: Filed Vitals:   04/03/15 1102  BP:   Pulse: 70  Temp:   Resp: 20    Intake/Output Summary (Last 24 hours) at 04/03/15 1432 Last data filed at 04/03/15 0753  Gross per 24 hour  Intake 1391.99 ml  Output   1475 ml  Net -83.01 ml   Filed Weights   04/01/15 1429 04/02/15 0538 04/02/15 1655  Weight: 60.5 kg (133 lb 6.1 oz) 62.4 kg (137 lb 9.1 oz) 62.143 kg (137 lb)    Exam:   General:  Appears in no acute distress, trach  collar in place  Cardiovascular: S1-S2 regular  Respiratory: Bilateral rhonchi  Abdomen: Soft, nontender, no organomegaly  Musculoskeletal: No edema of extremities   Data Reviewed: Basic Metabolic Panel:  Recent Labs Lab 03/29/15 0410 03/30/15 0345 03/31/15 0530 04/01/15 0520 04/02/15 0628 04/03/15 0435  NA  --  135 135 133* 135 135  K  --  3.1* 3.3* 3.8 3.4* 3.3*  CL  --  96 95* 94* 93* 95*  CO2  --  31 29 25  34* 35*  GLUCOSE  --  107* 77 92 117* 91  BUN  --  <5* <5* <5* <5* 7  CREATININE  --  0.51 0.52 0.66 0.57 0.42*  CALCIUM  --  9.0 8.7 8.9 9.0 8.8  MG 1.4* 1.4*  --  1.3* 1.6 1.7   Liver Function Tests: No results for input(s): AST, ALT, ALKPHOS, BILITOT, PROT, ALBUMIN in the last  168 hours. No results for input(s): LIPASE, AMYLASE in the last 168 hours. No results for input(s): AMMONIA in the last 168 hours. CBC:  Recent Labs Lab 03/29/15 0410 03/30/15 0345 03/31/15 0530 04/01/15 0520 04/03/15 0435  WBC 10.4 8.4 9.3 10.5 8.5  HGB 10.2* 9.7* 9.7* 10.6* 9.8*  HCT 30.1* 28.7* 28.6* 30.9* 29.5*  MCV 95.9 96.6 96.3 95.1 98.0  PLT 176 185 189 225 294   Cardiac Enzymes: No results for input(s): CKTOTAL, CKMB, CKMBINDEX, TROPONINI in the last 168 hours. BNP (last 3 results) No results for input(s):  BNP in the last 8760 hours.  ProBNP (last 3 results) No results for input(s): PROBNP in the last 8760 hours.  CBG:  Recent Labs Lab 04/02/15 0803 04/02/15 1229 04/02/15 2358 04/03/15 0428 04/03/15 0900  GLUCAP 122* 113* 101* 98 108*    No results found for this or any previous visit (from the past 240 hour(s)).   Studies: No results found.  Scheduled Meds: . chlorhexidine  15 mL Mouth/Throat BID  . enoxaparin (LOVENOX) injection  40 mg Subcutaneous Q24H  . ergocalciferol  50,000 Units Oral Weekly  . fentaNYL  50 mcg Transdermal Q72H  . folic acid  1 mg Intravenous Daily  . multivitamin  5 mL Oral Daily  . nicotine  21 mg Transdermal Daily  . scopolamine  1 patch Transdermal Q72H  . thiamine  100 mg Oral Daily   Or  . thiamine  100 mg Intravenous Daily   Continuous Infusions: . feeding supplement (OSMOLITE 1.2 CAL) 1,000 mL (04/03/15 0644)  . lactated ringers 10 mL/hr at 04/02/15 2030  . sodium chloride irrigation      Active Problems:   HTN (hypertension)   Tobacco abuse   Alcohol abuse   Hypokalemia   Marijuana abuse   Folate deficiency   Protein-calorie malnutrition, severe   Vitamin D deficiency   Squamous cell carcinoma of supraglottis   Head and neck cancer   Pharyngeal cancer   Hypomagnesemia   Tracheostomy care    Time spent: *20 min    [facility] Pager [phone]. If 7PM-7AM, please contact night-coverage at [email or web address], password [facility] 04/03/2015, 2:32 PM  LOS: 8 days

## 2015-04-03 NOTE — Progress Notes (Signed)
Patient called me, asked that I come by his room. 1. I visited, addressed his expressed interest in eating/drinking/ice chips with his RN, Elsi.  He indicated understanding that his MDs/dietician will advance his diet as appropriate and safe. 2. Spoke with Dr. Darrick Meigs who indicated he is ordering Mucinex to help with secretions. 3. Patient understands he will have CT SIM on Monday afternoon.  Gayleen Orem, RN, BSN, [place] at [place] [phone]

## 2015-04-03 NOTE — Progress Notes (Signed)
Per Elpidio Eric RN patient will have CT similation on Monday,April 25 at 1330,and patient TF has to be stopped 30 minutes before the procedure. I will endorsed to night nurse.

## 2015-04-03 NOTE — Progress Notes (Signed)
Physical Therapy Treatment Patient Details Name: Kyle Alexander MRN: [id number] DOB: 09-13-1961 Today's Date: 04/03/2015    History of Present Illness Kyle Alexander is a 54 y.o. male with squamous cell carcinoma of oropharynx, hypopharynx, and larynx S/p tracheostomy 4/14 and multiple dental extractions on 4/14.    PT Comments    Pt in bed on trach collar 28%  5 lts at rest sats 94%.  Assisted OOB to amb in hallway + 2 assist for safety and equipment.  Pt declined any rec for AD and demonstrated a very unsteady/drunken gait.  Pt was also impulsive and required assist with multiple lines.  Pt tolerated amb full unit.    Follow Up Recommendations  SNF     Equipment Recommendations  None recommended by PT    Recommendations for Other Services       Precautions / Restrictions Precautions Precautions: Fall Precaution Comments: trach collar/feeding tube    Mobility  Bed Mobility Overal bed mobility: Modified Independent             General bed mobility comments: increased time due to multiple lines/  Transfers Overall transfer level: Needs assistance Equipment used: None   Sit to Stand: Supervision         General transfer comment: supervision for safety with assist to manage lines/supplemental O2.   Ambulation/Gait Ambulation/Gait assistance: Min assist Ambulation Distance (Feet): 550 Feet Assistive device: None (pt declined any AD ) Gait Pattern/deviations: Step-through pattern;Decreased stride length;Trunk flexed;Narrow base of support;Staggering left;Staggering right Gait velocity: decreased   General Gait Details: very unsteady/druken gait yet declined rec for AD.  Impulsive and HIGH FALL RISK.  Tolerated amb distance well but demonstrates impaired balance.     Stairs            Wheelchair Mobility    Modified Rankin (Stroke Patients Only)       Balance                                    Cognition Arousal/Alertness:  Awake/alert Behavior During Therapy: WFL for tasks assessed/performed Overall Cognitive Status: Within Functional Limits for tasks assessed                      Exercises      General Comments        Pertinent Vitals/Pain Pain Assessment: No/denies pain    Home Living                      Prior Function            PT Goals (current goals can now be found in the care plan section) Progress towards PT goals: Progressing toward goals    Frequency  Min 3X/week    PT Plan Current plan remains appropriate    Co-evaluation             End of Session Equipment Utilized During Treatment: Gait belt;Oxygen Activity Tolerance: Patient tolerated treatment well Patient left: in bed;with call bell/phone within reach     Time: 1137-1205 PT Time Calculation (min) (ACUTE ONLY): 28 min  Charges:  $Gait Training: 8-22 mins $Therapeutic Activity: 8-22 mins                    G Codes:      Rica Koyanagi  PTA [facility]  Acute  Rehab Pager  [phone]  

## 2015-04-03 NOTE — Progress Notes (Signed)
Patient reused to placed bed to lowest position expalined the imporatnce of keeping the bed down for safety purposes,but still refused.

## 2015-04-03 NOTE — Plan of Care (Signed)
Problem: Phase I Progression Outcomes Goal: Pain controlled with appropriate interventions Outcome: Progressing Patient with pain in abdomen esp after coughing. Prn dilaudid does decrease pain

## 2015-04-03 NOTE — Progress Notes (Signed)
Patient refuses to have bed in lowest position. Wants bed kept in highest position in order to "see TV". Side rails up x4 due to bed being in highest position. Patient alert and oriented and instructed to call if need to get oob for assist with lowering bed. Patient also sleeps sitting up at 90 degrees.

## 2015-04-03 NOTE — Plan of Care (Signed)
Problem: Phase III Progression Outcomes Goal: Voiding independently Outcome: Progressing Patient uses the urinal to void.

## 2015-04-03 NOTE — Telephone Encounter (Signed)
Spoke with Nursing Secretary Caren Griffins:  I asked that she convey to patient's nurse, Butch Penny, that patient's SIM is scheduled for 1:30 pm next Monday.  I reiterated instruction for continuous feed to be stopped minimally 30 minutes prior to [facility] appt.    She verbalized understanding of message.  Gayleen Orem, RN, BSN, [place] at [place] [phone]

## 2015-04-03 NOTE — Progress Notes (Signed)
Pt has his bed raised high in the air. Pt has been informed about hospital policy to have bed setting in the lowest position. Pt was educated about safety risk. Pt refuse to have bed in lowest position and his bed remains on highest position. Will continue to monitor.

## 2015-04-03 NOTE — Progress Notes (Signed)
To provide support and encouragement, care continuity, met with patient in CT SIM. 1. Patient was producing copious amts of secretions.   Per RTT Jehna's and my discussion with Dr. Isidore Moos, and input from patient, SIM is being rescheduled for Monday afternoon. 2. I assisted with patient's return to 1331, provided report to his nurse, Butch Penny.  Included request, per RTT Jehna, that tube feed be stopped minimally 30 minutes before SIM. 3. Indicated I would call with Monday's SIM appt time.  Gayleen Orem, RN, BSN, [place] at [place] [phone]

## 2015-04-04 LAB — BASIC METABOLIC PANEL
ANION GAP: 7 (ref 5–15)
BUN: 6 mg/dL (ref 6–23)
CALCIUM: 9.2 mg/dL (ref 8.4–10.5)
CHLORIDE: 97 mmol/L (ref 96–112)
CO2: 34 mmol/L — ABNORMAL HIGH (ref 19–32)
CREATININE: 0.46 mg/dL — AB (ref 0.50–1.35)
Glucose, Bld: 137 mg/dL — ABNORMAL HIGH (ref 70–99)
POTASSIUM: 3.8 mmol/L (ref 3.5–5.1)
Sodium: 138 mmol/L (ref 135–145)

## 2015-04-04 LAB — GLUCOSE, CAPILLARY
GLUCOSE-CAPILLARY: 87 mg/dL (ref 70–99)
GLUCOSE-CAPILLARY: 125 mg/dL — AB (ref 70–99)
GLUCOSE-CAPILLARY: 88 mg/dL (ref 70–99)
Glucose-Capillary: 109 mg/dL — ABNORMAL HIGH (ref 70–99)
Glucose-Capillary: 110 mg/dL — ABNORMAL HIGH (ref 70–99)
Glucose-Capillary: 114 mg/dL — ABNORMAL HIGH (ref 70–99)
Glucose-Capillary: 70 mg/dL (ref 70–99)
Glucose-Capillary: 76 mg/dL (ref 70–99)
Glucose-Capillary: 84 mg/dL (ref 70–99)

## 2015-04-04 MED ORDER — HYDROGEN PEROXIDE 3 % EX SOLN
CUTANEOUS | Status: AC
  Administered 2015-04-04: 02:00:00
  Filled 2015-04-04: qty 473

## 2015-04-04 MED ORDER — ALBUTEROL SULFATE (2.5 MG/3ML) 0.083% IN NEBU: 2.5000 mg | INHALATION_SOLUTION | Freq: Four times a day (QID) | RESPIRATORY_TRACT | Status: DC | PRN

## 2015-04-04 NOTE — Consult Note (Signed)
WOC wound consult note Reason for Consult: partial thickness tissue loss; Stage 2 pressure injury from a medical device (trach collar) Wound type: Pressure; medical device related pressure injury Pressure Ulcer POA: Yes Measurement:0.4cm round x 0.2cm Wound FEX:MDYJW, pink, moist Drainage (amount, consistency, odor) scant serous Periwound:intact Dressing procedure/placement/frequency:I will suggest that the bedside RN cover the open area with a small piece of xeroform gauze, top with dry gauze drain sponge and change twice daily until this area has reepithelialized and regained some tensile strength (approximately 10 days). [place] nursing team will not follow, but will remain available to this patient, the nursing and medical team.  Please re-consult if needed. Thanks, Maudie Flakes, MSN, RN, [place], [place], [place] [phone])

## 2015-04-04 NOTE — Progress Notes (Signed)
[facility] PROGRESS NOTE  Kyle Alexander QPR:[id number] DOB: 09/29/1961 DOA: 03/26/2015 PCP: Pcp Not In System  Assessment/Plan: 1. Squamous cell carcinoma of the nasopharynx, hypopharynx and larynx- status post tracheostomy and multiple dental extractions on 4/14 NG tube placement on 4/19. Patient transferred to the hospital for palliative radiation treatment. Outpatient PET scan has been scheduled for 4/22, will have to be rescheduled at the time of discharge. Continue fentanyl patch every 72 hours and when necessary Dilaudid 1 mg every 4 hours when necessary. Scopolamine patch for increased secretions. Continue guaifenesin. 2. Status post dental extraction- patient was seen by dental medicine this morning, recommend continuing salt water rinses to aid healing. Patient will need to schedule suture removal in dental medicine clinic early next week. Patient cleared for start of radiation therapy on 04/09/2015. 3. Nutrition- patient has PEG tube in place, continue Osmolite 1.2 at  65 mL per hour. 4. Hypokalemia- replaced potassium  Today is 3.8. 5. Anemia-  likely acute blood loss anemia hemoglobin 9.8 on 04/03/2015 dropped from 13.9 on admission. We'll check CBC in a.m. FOBT pending 6. Tobacco abuse- continue nicotine patch 7. Alcohol abuse- Continue CIWA protocol  8.  thoracic aortic dilation- patient has 4 cm ascending aortic dilation seen on the CT chest, will need follow-up CTA or MRA 1 year 9. 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 Patient recently underwent tracheostomy as well as PEG tube placement and now transferred to [facility] long  hospital for palliative radiation. Patient found  not  a candidate for surgery or chemotherapy. This morning patient feels better. Started on guaifenesin liquid yesterday. Coughing has improved.    Objective: Filed Vitals:   04/04/15 1142  BP:   Pulse: 80  Temp:   Resp: 20    Intake/Output Summary (Last 24 hours) at 04/04/15 1221 Last data filed at 04/04/15 1032  Gross per 24 hour  Intake     60 ml  Output   1250 ml  Net  -1190 ml   Filed Weights   04/01/15 1429 04/02/15 0538 04/02/15 1655  Weight: 60.5 kg (133 lb 6.1 oz) 62.4 kg (137 lb 9.1 oz) 62.143 kg (137 lb)    Exam:   General:  Appears in no acute distress, trach  collar in place  Cardiovascular: S1-S2 regular  Respiratory: Bilateral rhonchi  Abdomen: Soft, nontender, no organomegaly  Musculoskeletal: No edema of extremities   Data Reviewed: Basic Metabolic Panel:  Recent Labs Lab 03/29/15 0410  03/30/15 0345 03/31/15 0530 04/01/15 0520 04/02/15 0628 04/03/15 0435 04/04/15 0408  NA  --   < > 135 135 133* 135 135 138  K  --   < > 3.1* 3.3* 3.8 3.4* 3.3* 3.8  CL  --   < > 96 95* 94* 93* 95* 97  CO2  --   < > 31 29 25  34* 35* 34*  GLUCOSE  --   < > 107* 77 92 117* 91 137*  BUN  --   < > <5* <5* <5* <5* 7 6  CREATININE  --   < > 0.51 0.52 0.66 0.57 0.42* 0.46*  CALCIUM  --   < > 9.0 8.7 8.9 9.0 8.8  9.2  MG 1.4*  --  1.4*  --  1.3* 1.6 1.7  --   < > = values in this interval not displayed. Liver Function Tests: No results for input(s): AST, ALT, ALKPHOS, BILITOT, PROT, ALBUMIN in the last 168 hours. No results for input(s): LIPASE, AMYLASE in the last 168 hours. No results for input(s): AMMONIA in the last 168 hours. CBC:  Recent Labs Lab 03/29/15 0410 03/30/15 0345 03/31/15 0530 04/01/15 0520 04/03/15 0435  WBC 10.4 8.4 9.3 10.5 8.5  HGB 10.2* 9.7* 9.7* 10.6* 9.8*  HCT 30.1* 28.7* 28.6* 30.9* 29.5*  MCV 95.9 96.6 96.3 95.1 98.0  PLT 176 185 189 225 294   Cardiac Enzymes: No results for  input(s): CKTOTAL, CKMB, CKMBINDEX, TROPONINI in the last 168 hours. BNP (last 3 results) No results for input(s): BNP in the last 8760 hours.  ProBNP (last 3 results) No results for input(s): PROBNP in the last 8760 hours.  CBG:  Recent Labs Lab 04/03/15 2127 04/04/15 0024 04/04/15 0502 04/04/15 0820 04/04/15 1145  GLUCAP 87 76 125* 88 114*    No results found for this or any previous visit (from the past 240 hour(s)).   Studies: No results found.  Scheduled Meds: . chlorhexidine  15 mL Mouth/Throat BID  . enoxaparin (LOVENOX) injection  40 mg Subcutaneous Q24H  . ergocalciferol  50,000 Units Oral Weekly  . fentaNYL  50 mcg Transdermal Q72H  . folic acid  1 mg Intravenous Daily  . guaiFENesin  15 mL Oral QID  . multivitamin  5 mL Oral Daily  . nicotine  21 mg Transdermal Daily  . scopolamine  1 patch Transdermal Q72H  . thiamine  100 mg Oral Daily   Or  . thiamine  100 mg Intravenous Daily   Continuous Infusions: . feeding supplement (OSMOLITE 1.2 CAL) 1,000 mL (04/03/15 0644)  . lactated ringers 10 mL/hr at 04/02/15 2030  . sodium chloride irrigation      Active Problems:   HTN (hypertension)   Tobacco abuse   Alcohol abuse   Hypokalemia   Marijuana abuse   Folate deficiency   Protein-calorie malnutrition, severe   Vitamin D deficiency   Squamous cell carcinoma of supraglottis   Head and neck cancer   Pharyngeal cancer   Hypomagnesemia   Tracheostomy care    Time spent: *20 min    [place] [facility] Pager [phone]. If 7PM-7AM, please contact night-coverage at [email or web address], password [facility] 04/04/2015, 12:21 PM  LOS: 9 days

## 2015-04-05 DIAGNOSIS — Z430 Encounter for attention to tracheostomy: Secondary | ICD-10-CM

## 2015-04-05 LAB — GLUCOSE, CAPILLARY
GLUCOSE-CAPILLARY: 104 mg/dL — AB (ref 70–99)
GLUCOSE-CAPILLARY: 115 mg/dL — AB (ref 70–99)
GLUCOSE-CAPILLARY: 106 mg/dL — AB (ref 70–99)
Glucose-Capillary: 111 mg/dL — ABNORMAL HIGH (ref 70–99)
Glucose-Capillary: 90 mg/dL (ref 70–99)

## 2015-04-05 MED ORDER — SALINE SPRAY 0.65 % NA SOLN
1.0000 | NASAL | Status: DC | PRN
  Filled 2015-04-05: qty 44

## 2015-04-05 NOTE — Progress Notes (Addendum)
[facility] PROGRESS NOTE  Kyle Alexander KNL:[id number] DOB: Feb 24, 1961 DOA: 03/26/2015 PCP: Pcp Not In System  Assessment/Plan: 1. Squamous cell carcinoma of the nasopharynx, hypopharynx and larynx- status post tracheostomy and multiple dental extractions on 4/14 NG tube placement on 4/19. Patient transferred to the hospital for palliative radiation treatment. Outpatient PET scan has been scheduled for 4/22, will have to be rescheduled at the time of discharge. Continue fentanyl patch every 72 hours and when necessary Dilaudid 1 mg every 4 hours when necessary. Scopolamine patch for increased secretions. Continue guaifenesin, chest PT. 2. Stage 2 pressure injury from trach collar- Wound care consulted. Will follow the recommendations. 3. Status post dental extraction- patient was seen by dental medicine this morning, recommend continuing salt water rinses to aid healing. Patient will need to schedule suture removal in dental medicine clinic early next week. Patient cleared for start of radiation therapy on 04/09/2015. 4. Nutrition- patient has PEG tube in place, continue Osmolite 1.2 at  65 mL per hour. 5. Hypokalemia- replete, repeat k  is 3.8. 6. Anemia-  likely acute blood loss anemia hemoglobin 9.8 on 04/03/2015 dropped from 13.9 on admission. FOBT pending 7. Tobacco abuse- continue nicotine patch 8. Alcohol abuse- Continue CIWA protocol  9.  thoracic aortic dilation- patient has 4 cm ascending aortic dilation seen on the CT chest, will need follow-up CTA or MRA 1 year 10. 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 Patient  recently underwent tracheostomy as well as PEG tube placement and now transferred to [facility] long hospital for palliative radiation. Patient found  not  a candidate for surgery or chemotherapy. This morning patient feels better. Started on guaifenesin liquid, and chest PT. Talking better.   Objective: Filed Vitals:   04/05/15 0440  BP: 118/68  Pulse: 76  Temp: 98.5 F (36.9 C)  Resp: 16    Intake/Output Summary (Last 24 hours) at 04/05/15 1221 Last data filed at 04/04/15 2342  Gross per 24 hour  Intake     60 ml  Output    200 ml  Net   -140 ml   Filed Weights   04/02/15 0538 04/02/15 1655 04/05/15 0617  Weight: 62.4 kg (137 lb 9.1 oz) 62.143 kg (137 lb) 53.524 kg (118 lb)    Exam:   General:  Appears in no acute distress, trach  collar in place  Cardiovascular: S1-S2 regular  Respiratory: Bilateral rhonchi  Abdomen: Soft, nontender, no organomegaly  Musculoskeletal: No edema of extremities   Data Reviewed: Basic Metabolic Panel:  Recent Labs Lab 03/30/15 0345 03/31/15 0530 04/01/15 0520 04/02/15 0628 04/03/15 0435 04/04/15 0408  NA 135 135 133* 135 135 138  K 3.1* 3.3* 3.8 3.4* 3.3* 3.8  CL 96 95* 94* 93* 95* 97  CO2 31 29 25  34* 35* 34*  GLUCOSE 107* 77 92 117* 91 137*  BUN <5* <5* <5* <5* 7 6  CREATININE 0.51 0.52 0.66 0.57 0.42* 0.46*  CALCIUM 9.0 8.7 8.9 9.0 8.8 9.2  MG 1.4*  --  1.3* 1.6 1.7  --    Liver Function Tests: No results for input(s): AST, ALT, ALKPHOS, BILITOT, PROT, ALBUMIN in the last 168 hours. No  results for input(s): LIPASE, AMYLASE in the last 168 hours. No results for input(s): AMMONIA in the last 168 hours. CBC:  Recent Labs Lab 03/30/15 0345 03/31/15 0530 04/01/15 0520 04/03/15 0435  WBC 8.4 9.3 10.5 8.5  HGB 9.7* 9.7* 10.6* 9.8*  HCT 28.7* 28.6* 30.9* 29.5*  MCV 96.6 96.3 95.1 98.0  PLT 185 189 225 294   CBG:  Recent Labs Lab 04/04/15 2018 04/04/15 2350 04/05/15 0408 04/05/15 0810 04/05/15 1142  GLUCAP  109* 70 111* 90 104*    No results found for this or any previous visit (from the past 240 hour(s)).   Studies: No results found.  Scheduled Meds: . chlorhexidine  15 mL Mouth/Throat BID  . enoxaparin (LOVENOX) injection  40 mg Subcutaneous Q24H  . ergocalciferol  50,000 Units Oral Weekly  . fentaNYL  50 mcg Transdermal Q72H  . folic acid  1 mg Intravenous Daily  . guaiFENesin  15 mL Oral QID  . multivitamin  5 mL Oral Daily  . nicotine  21 mg Transdermal Daily  . scopolamine  1 patch Transdermal Q72H  . thiamine  100 mg Oral Daily   Or  . thiamine  100 mg Intravenous Daily   Continuous Infusions: . feeding supplement (OSMOLITE 1.2 CAL) 1,000 mL (04/05/15 0618)  . sodium chloride irrigation      Active Problems:   HTN (hypertension)   Tobacco abuse   Alcohol abuse   Hypokalemia   Marijuana abuse   Folate deficiency   Protein-calorie malnutrition, severe   Vitamin D deficiency   Squamous cell carcinoma of supraglottis   Head and neck cancer   Pharyngeal cancer   Hypomagnesemia   Tracheostomy care    Time spent: *20 min    [place] [facility] Pager [phone]. If 7PM-7AM, please contact night-coverage at [email or web address], password [facility] 04/05/2015, 12:21 PM  LOS: 10 days

## 2015-04-06 ENCOUNTER — Ambulatory Visit
Admit: 2015-04-06 | Discharge: 2015-04-06 | Disposition: A | Discharge status: Home / Self Care | Payer: Medicaid Other | Attending: Radiation Oncology | Admitting: Radiation Oncology

## 2015-04-06 ENCOUNTER — Encounter: Payer: Self-pay | Admitting: *Deleted

## 2015-04-06 DIAGNOSIS — K08109 Complete loss of teeth, unspecified cause, unspecified class: Secondary | ICD-10-CM

## 2015-04-06 DIAGNOSIS — C321 Malignant neoplasm of supraglottis: Secondary | ICD-10-CM

## 2015-04-06 LAB — GLUCOSE, CAPILLARY
GLUCOSE-CAPILLARY: 115 mg/dL — AB (ref 70–99)
GLUCOSE-CAPILLARY: 94 mg/dL (ref 70–99)
Glucose-Capillary: 100 mg/dL — ABNORMAL HIGH (ref 70–99)
Glucose-Capillary: 110 mg/dL — ABNORMAL HIGH (ref 70–99)
Glucose-Capillary: 121 mg/dL — ABNORMAL HIGH (ref 70–99)
Glucose-Capillary: 90 mg/dL (ref 70–99)

## 2015-04-06 NOTE — Progress Notes (Signed)
Speech Language Pathology Treatment: Dysphagia;Passy Muir Speaking valve  Patient Details Name: Kyle Alexander MRN: [id number] DOB: November 08, 1961 Today's Date: 04/06/2015 Time: 3383-2919 SLP Time Calculation (min) (ACUTE ONLY): 40 min  Assessment / Plan / Recommendation Clinical Impression  Pt today sitting upright in chair - oxygen not in place and pt complaining of being hot.  Pt declined offer to change temperature in room.  SLP assisted pt to put oxygen back in place and requested pt wear PMSV.  Pt declined SLP offer to place PMSV (SLP concerned re: pt's wound from trach collar) stating "I can do it".  Provided pt with exercises to complete to mitigate his dysphagia including jaw stretching with tongue depressors, Masako swallow (lingual protrusion), effortful swallow and lingual press to hard palate to help submental elevator muscles.  Pt reports being "dry" but stated that SLP "missed the swallow" when SLP observing his swallows.  Moderate cues helpful to maximize his participation.   Provided exercises verbally and in writing as well as xerostomia compensations.  SLP fashioned a "bite block" using tongue depressors for pt (19 tongue depressors) and pt demonstrated its use.    Pt benefited from facilitation to maximize participation and to agree to consume ice chips.  Swallows of ice chips immediately followed by coughing and expectoration - secretions likely mixing with melted water with resultant aspiration.   Educated pt to need to consume ice chips with PMSV in place and after oral care to decrease disuse muscle atrophy.    Recommend to continue dysphagia treatment as pt will participate.  Encouraged pt to complete exercises in the next few days and SlP will follow up later in the week for treatment.  Pt agreeable to plan.     HPI HPI: Kyle Alexander is a 54 y.o. male with squamous cell carcinoma of oropharynx, hypopharynx, and larynx S/p tracheostomy 4/14 and multiple dental extractions on  4/14.Pt has undergone alternative means of nutrition placement for nutritional support.  He has continued to have problems with secretions and has attempted chest PT x2.  He is scheduled for palliative radiation but is not a candidate for surgery or chemo per review of chart.  Today pt seen for SlP treatment to maximize pt's functional communication/swallow ability.     Pertinent Vitals Pain Assessment:  (no report of pain)  SLP Plan  Continue with current plan of care    Recommendations Diet recommendations: NPO (ice after oral care to decrease disuse atrophy) Medication Administration: Via alternative means Postural Changes and/or Swallow Maneuvers: Seated upright 90 degrees (for ice chips only)      Patient may use Passy-Muir Speech Valve: During all waking hours (remove during sleep) PMSV Supervision: Intermittent       Oral Care Recommendations: Oral care Q4 per protocol;Oral care prior to ice chips Follow up Recommendations: Skilled Nursing facility Plan: Continue with current plan of care    Luanna Salk, [facility], [facility] SLP [phone]

## 2015-04-06 NOTE — Progress Notes (Signed)
[facility] PROGRESS NOTE  KARTER HELLMER RSW:[id number] DOB: Jun 20, 1961 DOA: 03/26/2015 PCP: Pcp Not In System  Assessment/Plan:   1. Squamous cell carcinoma of the nasopharynx, hypopharynx and larynx- status post tracheostomy and multiple dental extractions on 4/14 NG tube placement on 4/19. Patient transferred to the hospital for palliative radiation treatment. Outpatient PET scan has been scheduled for 4/22, will have to be rescheduled at the time of discharge. Continue fentanyl patch every 72 hours and when necessary Dilaudid 1 mg every 4 hours when necessary. Scopolamine patch for increased secretions. Continue guaifenesin, chest PT.CT simulation today, radiation treatment to begin on 04/09/15.Patient to call for appointment in 2-3 weeks for oral examination during radiation therapy. 2. Status post dental extraction- patient was seen by dental medicine this morning, recommend continuing salt water rinses to aid healing. Patient will need to schedule suture removal in dental medicine clinic early next week. Patient cleared for start of radiation therapy on 04/09/2015. 3. Nutrition- patient has PEG tube in place, continue Osmolite 1.2 at  65 mL per hour. 4. Hypokalemia- replete, repeat k  is 3.8 on 04/04/15 5. Anemia-  likely acute blood loss anemia hemoglobin 9.8 on 04/03/2015 dropped from 13.9 on admission. FOBT pending as of 4/25 6. Tobacco abuse- continue nicotine patch 7. Stage 2 pressure injury from trach collar- Wound care consulted.  8. Alcohol abuse- Continue CIWA protocol  9.  thoracic aortic dilation- patient has 4 cm ascending aortic dilation seen on the CT chest, will need follow-up CTA or MRA 1 year 10. 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 Patient recently underwent tracheostomy as well as PEG tube placement and now transferred to [facility] for palliative radiation. Patient found  not  a candidate for surgery or chemotherapy. This morning patient feels good. Says the cough is better, plan for CT simulation today.  Objective: Filed Vitals:   04/06/15 0816  BP:   Pulse: 88  Temp:   Resp: 20    Intake/Output Summary (Last 24 hours) at 04/06/15 1624 Last data filed at 04/06/15 0536  Gross per 24 hour  Intake     60 ml  Output    600 ml  Net   -540 ml   Filed Weights   04/02/15 0538 04/02/15 1655 04/05/15 0617  Weight: 62.4 kg (137 lb 9.1 oz) 62.143 kg (137 lb) 53.524 kg (118 lb)    Exam:   General:  Appears in no acute distress, trach  collar in place  Cardiovascular: S1-S2 regular  Respiratory: bilateral rhonchi  Abdomen: Soft, nontender, no organomegaly  Musculoskeletal: No edema of extremities   Data Reviewed: Basic Metabolic Panel:  Recent Labs Lab 03/31/15 0530 04/01/15 0520 04/02/15 0628 04/03/15 0435 04/04/15 0408  NA 135 133* 135 135 138  K 3.3* 3.8 3.4* 3.3* 3.8  CL 95* 94* 93* 95* 97  CO2 29 25 34* 35* 34*  GLUCOSE 77 92 117* 91 137*  BUN <5* <5* <5* 7 6  CREATININE 0.52 0.66 0.57 0.42* 0.46*  CALCIUM 8.7 8.9 9.0 8.8 9.2  MG  --  1.3* 1.6 1.7  --      Recent Labs Lab 03/31/15 0530 04/01/15  0520 04/03/15 0435  WBC 9.3 10.5 8.5  HGB 9.7* 10.6* 9.8*  HCT 28.6* 30.9* 29.5*  MCV 96.3 95.1 98.0  PLT 189 225 294   CBG:  Recent Labs Lab 04/05/15 2059 04/06/15 0033 04/06/15 0602 04/06/15 0817 04/06/15 1302  GLUCAP 106* 90 121* 115* 110*    No results found for this or any previous visit (from the past 240 hour(s)).   Studies: No results found.  Scheduled Meds: . chlorhexidine  15 mL Mouth/Throat BID  . enoxaparin  (LOVENOX) injection  40 mg Subcutaneous Q24H  . ergocalciferol  50,000 Units Oral Weekly  . fentaNYL  50 mcg Transdermal Q72H  . folic acid  1 mg Intravenous Daily  . guaiFENesin  15 mL Oral QID  . multivitamin  5 mL Oral Daily  . nicotine  21 mg Transdermal Daily  . scopolamine  1 patch Transdermal Q72H  . thiamine  100 mg Oral Daily   Or  . thiamine  100 mg Intravenous Daily   Continuous Infusions: . feeding supplement (OSMOLITE 1.2 CAL) 1,000 mL (04/05/15 2321)  . sodium chloride irrigation      Active Problems:   HTN (hypertension)   Tobacco abuse   Alcohol abuse   Hypokalemia   Marijuana abuse   Folate deficiency   Protein-calorie malnutrition, severe   Vitamin D deficiency   Squamous cell carcinoma of supraglottis   Head and neck cancer   Pharyngeal cancer   Hypomagnesemia   Tracheostomy care    Time spent: *20 min    [place] [facility] Pager [phone]. If 7PM-7AM, please contact night-coverage at [email or web address], password [facility] 04/06/2015, 4:24 PM  LOS: 11 days

## 2015-04-06 NOTE — Progress Notes (Signed)
Physical Therapy Treatment Patient Details Name: Kyle Alexander MRN: [id number] DOB: 31-May-1961 Today's Date: 04/06/2015    History of Present Illness Kyle Alexander is a 54 y.o. male with squamous cell carcinoma of oropharynx, hypopharynx, and larynx S/p tracheostomy 4/14 and multiple dental extractions on 4/14.    PT Comments    Patient remains  Unsteady but declines to use an AD. Patient easily distracted at times.  Follow Up Recommendations  SNF     Equipment Recommendations  None recommended by PT    Recommendations for Other Services       Precautions / Restrictions Precautions Precautions: Fall Precaution Comments: trach collar/feeding tube    Mobility  Bed Mobility                  Transfers                    Ambulation/Gait Ambulation/Gait assistance: Min guard;Supervision Ambulation Distance (Feet): 550 Feet Assistive device: None Gait Pattern/deviations: Step-through pattern;Staggering right;Staggering left Gait velocity: decreased   General Gait Details:   Tolerated amb distance well but demonstrates impaired balance.  improved  balance today, veers left and right, at times uses rail. Variable need from supervision to min guard.   Stairs            Wheelchair Mobility    Modified Rankin (Stroke Patients Only)       Balance           Standing balance support: No upper extremity supported;During functional activity Standing balance-Leahy Scale: Fair                      Cognition Arousal/Alertness: Awake/alert Behavior During Therapy: Impulsive                        Exercises      General Comments        Pertinent Vitals/Pain Pain Assessment: No/denies pain    Home Living                      Prior Function            PT Goals (current goals can now be found in the care plan section) Progress towards PT goals: Progressing toward goals    Frequency  Min 3X/week    PT  Plan Current plan remains appropriate    Co-evaluation             End of Session Equipment Utilized During Treatment: Oxygen;Gait belt Activity Tolerance: Patient tolerated treatment well Patient left: in chair;with chair alarm set;with call bell/phone within reach     Time: 1048-1100 PT Time Calculation (min) (ACUTE ONLY): 12 min  Charges:  $Gait Training: 8-22 mins                    G Codes:      Claretha Cooper 04/06/2015, 1:06 PM Tresa Endo PT [phone]

## 2015-04-06 NOTE — Progress Notes (Signed)
Pt arrived for CT simulation.  Pt coughing and producing a moderate amount of thick white sputum via mouth.  Pt orally suctioned and suctioned trach site.  Aaron Edelman radiation therapist attempted to access existing IV site, and asked for assistance due to no blood return and resistance when trying to flush site.  Site not functioning.  Attempted to access new IV site, 3 attempts made by Joaquim Lai RN, Judie Grieve RN and finally by Bonney Roussel 3 W oncology floor nurse.  Pt tolerated all attempts very will.  During CT mask mold pt doing very well, relaxing with eyes closed.

## 2015-04-06 NOTE — Progress Notes (Signed)
Per MD request patient was taken outside for fresh air by NT.  RN received call from NT that patient was smoking. RN notified security as this is a non smoking campus.  RN walked out and patient had already disposed of his cigarette.  Of note patient had refused to wear his O2 when he went outside.  When asked why the pt smoked, he stated: :"If I was home, I wouldn't have to smoke out here."  RN took patient's cigarettes and placed them in med drawer.  Pt was informed that this is a non smoking campus and he could not smoke here. Cigarettes to  Be returned to pt at discharge.  Pt already has nicotine patch on. AC made aware of this event.

## 2015-04-06 NOTE — Clinical Social Work Placement (Signed)
   CLINICAL SOCIAL WORK PLACEMENT  NOTE  Date:  04/06/2015  Patient Details  Name: Kyle Alexander MRN: [id number] Date of Birth: January 15, 1961  Clinical Social Work is seeking post-discharge placement for this patient at the [place] level of care (*CSW will initial, date and re-position this form in  chart as items are completed):  Yes   Patient/family provided with [place] Work Department's list of facilities offering this level of care within the geographic area requested by the patient (or if unable, by the patient's family).  Yes   Patient/family informed of their freedom to choose among providers that offer the needed level of care, that participate in Medicare, Medicaid or managed care program needed by the patient, have an available bed and are willing to accept the patient.  Yes   Patient/family informed of [facility]'s ownership interest in [facility] and [facility], as well as of the fact that they are under no obligation to receive care at these facilities.  PASRR submitted to EDS on 03/30/15     PASRR number received on 03/30/15     Existing PASRR number confirmed on       FL2 transmitted to all facilities in geographic area requested by pt/family on 03/30/15     FL2 transmitted to all facilities within larger geographic area on 04/06/15     Patient informed that his/her managed care company has contracts with or will negotiate with certain facilities, including the following:            Patient/family informed of bed offers received.  Patient chooses bed at       Physician recommends and patient chooses bed at      Patient to be transferred to   on  .  Patient to be transferred to facility by       Patient family notified on   of transfer.  Name of family member notified:        PHYSICIAN Please sign FL2     Additional Comment:    _______________________________________________ Ladell Pier,  LCSW 04/06/2015, 3:13 PM

## 2015-04-06 NOTE — Progress Notes (Addendum)
CSW continuing to follow.  CSW attempted to meet with pt at bedside, but pt currently out of room in radiation simulation at this time.  Pt transferred from [facility] on 04/02/15 for radiation treatment. Pt Medicaid pending and will needs SNF placement that can manage trach, PEG, and transportation to radiation treatment.   CSW re-initiated SNF search as per chart review, pt and pt sister were agreeable to rehab at [facility]. CSW spoke with Clinical Social Work Chief Financial Officer, [place] regarding placement and also left message for Dr. Reynaldo Minium regarding barriers to placement. Awaiting return phone call.   CSW to continue to follow to assist with pt disposition needs.   Addendum 3:33 pm:   CSW followed up with pt at bedside once pt returned from radiation simulation.  CSW discussed with pt about recommendation for rehab at [facility] and reminded pt about discussions with CSW when pt was at [facility].   Pt discussed that he was initially agreeable to explore SNF, but pt stated that when he is discharged from this hospital then he plans on returning to [facility]. CSW inquired with pt about his trach care and tube feeds and pt states that he can manage this care with the assistance of home health. CSW expressed concern about transportation to radiation appointments and pt showed CSW his [facility] transportation card and states that he plans to use [facility] services.   CSW discussed with pt that MD and PT recommendations are for a facility for a short term and pt states that he is aware of recommendation, but he wants to return home to [facility]. Pt became very vocal and adamant about returning home.   CSW notified RNCM.  CSW to continue to follow.   Alison Murray, MSW, [place] Work [phone]

## 2015-04-06 NOTE — Progress Notes (Addendum)
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 An Aquaplast head and shoulder mask was custom fitted to the patient's anatomy. High-resolution CT axial imaging was obtained of the head and neck with contrast. I verified that the quality of the imaging is good for treatment planning. 1 Medically Necessary Treatment Device was fabricated and supervised by me: Aquaplast mask.   Treatment planning note I plan to treat the patient with helical Tomotherapy, IMRT. I plan to treat the patient's tumor and bilateral neck nodes. I plan to treat to a total dose of 50 Gray in 20  Fractions. Dose calculation was ordered from dosimetry.  IMRT planning Note  IMRT is an important modality to deliver adequate dose to the patient's at risk tissues while sparing the patient's normal structures, including the: esophagus, parotid tissue, mandible, brain stem, spinal cord, oral cavity, brachial plexus.  This justifies the use of IMRT in the patient's treatment.     -----------------------------------  [name], MD

## 2015-04-06 NOTE — Progress Notes (Signed)
POST OPERATIVE NOTE:  04/06/2015    [name] [phone]  VITALS: BP 117/70 mmHg  Pulse 88  Temp(Src) 98.1 F (36.7 C) (Oral)  Resp 20  Ht 5\' 10"  (1.778 m)  Wt 118 lb (53.524 kg)  BMI 16.93 kg/m2  SpO2 100%  LABS:  Lab Results  Component Value Date   WBC 8.5 04/03/2015   HGB 9.8* 04/03/2015   HCT 29.5* 04/03/2015   MCV 98.0 04/03/2015   PLT 294 04/03/2015   BMET    Component Value Date/Time   NA 138 04/04/2015 0408   K 3.8 04/04/2015 0408   CL 97 04/04/2015 0408   CO2 34* 04/04/2015 0408   GLUCOSE 137* 04/04/2015 0408   BUN 6 04/04/2015 0408   CREATININE 0.46* 04/04/2015 0408   CALCIUM 9.2 04/04/2015 0408   GFRNONAA >90 04/04/2015 0408   GFRAA >90 04/04/2015 0408    Lab Results  Component Value Date   INR 1.14 03/30/2015   INR 1.08 03/15/2015   No results found for: PTT   [name] is status post extraction of remaining teeth with alveoloplasty and pre-prosthetic surgery as indicated in the operating with general anesthesia. Patient now presents for evaluation for suture removal.  SUBJECTIVE: Patient with minimal oral complaints. Stitches are still present.   EXAM: There is no sign of oral infection, heme, or ooze. Sutures are loosely intact. Patient is healing in by generalized primary closure. Several areas are healing in by secondary intention.  PROCEDURE: The patient was given a chlorhexidine gluconate rinse for 30 seconds. Sutures were then removed without complication. Patient tolerated the procedure well.  ASSESSMENT: Post operative course is consistent with dental procedures performed in the operating room. The patient is now edentulous.  PLAN: 1. Continue salt water rinses as needed to aid healing. 2. Patient is cleared to start radiation therapy on 04/09/2015. 3. Patient to call for appointment in 2-3 weeks for oral examination during radiation therapy.  Lenn Cal, DDS

## 2015-04-07 LAB — GLUCOSE, CAPILLARY
GLUCOSE-CAPILLARY: 100 mg/dL — AB (ref 70–99)
GLUCOSE-CAPILLARY: 103 mg/dL — AB (ref 70–99)
GLUCOSE-CAPILLARY: 108 mg/dL — AB (ref 70–99)

## 2015-04-07 MED ORDER — OSMOLITE 1.2 CAL PO LIQD
237.0000 mL | Freq: Four times a day (QID) | ORAL | Status: DC
  Administered 2015-04-07: 237 mL
  Filled 2015-04-07: qty 237

## 2015-04-07 NOTE — Progress Notes (Signed)
To provide support and encouragement, care continuity, met with patient during CT SIM.  Patient secretions better controlled since scan attempt last Friday.  He tolerated procedure without difficulty.  Gayleen Orem, RN, BSN, [place] at [place] [phone]

## 2015-04-07 NOTE — Progress Notes (Signed)
Occupational Therapy Treatment Patient Details Name: Kyle Alexander MRN: [id number] DOB: Jun 21, 1961 Today's Date: 04/07/2015    History of present illness Kyle Alexander is a 54 y.o. male with squamous cell carcinoma of oropharynx, hypopharynx, and larynx S/p tracheostomy 4/14 and multiple dental extractions on 4/14.   OT comments  Pt progressing with adls slowly but surely.  Pt has meet goals 1-3 and cont to work toward 4-5.  Cont to rec SNF.  If pt declines, would rec HHOT vs home safety eval at d/c.  Follow Up Recommendations  SNF;Supervision/Assistance - 24 hour    Equipment Recommendations       Recommendations for Other Services      Precautions / Restrictions Precautions Precautions: Fall Precaution Comments: trach collar/feeding tube Restrictions Weight Bearing Restrictions: No       Mobility Bed Mobility Overal bed mobility: Modified Independent                Transfers Overall transfer level: Needs assistance Equipment used: None Transfers: Sit to/from Stand Sit to Stand: Supervision         General transfer comment: supervision for safety with assist to manage lines/supplemental O2.     Balance Overall balance assessment: Needs assistance Sitting-balance support: Feet supported Sitting balance-Leahy Scale: Normal     Standing balance support: Bilateral upper extremity supported;During functional activity Standing balance-Leahy Scale: Good Standing balance comment: pt requires occasional min guard when up in room walking.                    ADL Overall ADL's : Needs assistance/impaired Eating/Feeding: Independent;Sitting   Grooming: Supervision/safety;Standing   Upper Body Bathing: Supervision/ safety;Sitting   Lower Body Bathing: Supervison/ safety;Sit to/from stand   Upper Body Dressing : Supervision/safety;Sitting   Lower Body Dressing: Supervision/safety;Sit to/from stand   Toilet Transfer: Supervision/safety;Ambulation    Toileting- Clothing Manipulation and Hygiene: Supervision/safety;Sit to/from stand       Functional mobility during ADLs: Min guard General ADL Comments: Pt overall is S now with all adls. Pt walked in room to perform bathing, dressing and grooming and did not need hands on assist. Pt requires cues for safety and to slow down as he is very impulsive with his mobility.      Vision                     Perception     Praxis      Cognition   Behavior During Therapy: Impulsive Overall Cognitive Status: Within Functional Limits for tasks assessed                       Extremity/Trunk Assessment               Exercises     Shoulder Instructions       General Comments      Pertinent Vitals/ Pain       Pain Assessment: No/denies pain  Home Living                                          Prior Functioning/Environment              Frequency Min 2X/week     Progress Toward Goals  OT Goals(current goals can now be found in the care plan section)  Progress towards OT goals: Progressing toward goals  Acute Rehab OT Goals Patient Stated Goal: Be myself OT Goal Formulation: With patient Time For Goal Achievement: 04/12/15 Potential to Achieve Goals: Good ADL Goals Pt Will Perform Upper Body Bathing: with supervision;sitting Pt Will Perform Upper Body Dressing: with supervision;sitting Pt Will Transfer to Toilet: with supervision;ambulating Pt/caregiver will Perform Home Exercise Program: Increased strength;Both right and left upper extremity;With Supervision Additional ADL Goal #1: Pt will manage trach collar and supplemental O2 during functional mobility and ADLs with Supervision.   Plan Discharge plan remains appropriate    Co-evaluation                 End of Session     Activity Tolerance Patient tolerated treatment well   Patient Left in bed;with call bell/phone within reach;with bed alarm set   Nurse  Communication Mobility status        Time: 2952-8413 OT Time Calculation (min): 19 min  Charges: OT General Charges $OT Visit: 1 Procedure OT Treatments $Self Care/Home Management : 8-22 mins  Kyle Alexander 04/07/2015, 11:00 AM  [phone]

## 2015-04-07 NOTE — Progress Notes (Signed)
INITIAL NUTRITION ASSESSMENT  DOCUMENTATION CODES Per approved criteria  -Severe malnutrition in the context of chronic illness -Underweight  Pt meets criteria for severe MALNUTRITION in the context of chronic illness as evidenced by severe muscle and fat depletion and 7% weight loss x 1 month.  INTERVENTION: Transition TF to bolus feeds: Initiate bolus feeds of 1 can of Osmolite 1.2 at 12 pm, 4pm and 8pm. Advance to goal as tolerated to 1.5 cans 4 times daily (8am, 12 pm, 4 pm, and 8pm).  Free water: 30 ml before and after each bolus. Patient will need an additional 150 ml BID to meet fluid needs. Tube feeding regimen provides 1710 kcal (100% of needs), 79 g of protein and 1170 ml of H2O.   Tube Feeding at Home booklet provided.  RD to continue to monitor  NUTRITION DIAGNOSIS: Inadequate oral intake related to inability to eat as evidenced by NPO status.   Goal: Pt to meet >/= 90% of their estimated nutrition needs   Monitor:  PO and supplemental intake, weight, labs, I/O's  Reason for Assessment: Pt transferred from [place] Dx: <principal problem not specified>  ASSESSMENT: 54 y/o AA male with history of HTN, tobacco and alcohol abuse who presented with complaint of an enlarging neck mass, progressive dysphagia, and weight loss for the past several months.  Pt transferred from [facility] for palliative radiation treatment. Pt is receiving Osmolite 1.2 continuously through PEG at goal rate of 65 ml/hr. This provides 1872 kcal (100% of needs), 87 grams of protein, and 1279 ml of H2O.  Per case manager, MD would like pt transitioned to bolus feeds before discharge. Pt is tolerating TF well. Recommendations provided.  Provided education and home TF booklet to patient. Pt with no questions at this time. RD to follow-up with patient 4/27 if still admitted.  Pt has lost 9 lb since 4/05 (7% weight loss x 1 month ,significant for time frame).  Nutrition Focused  Physical Exam:  Subcutaneous Fat:  Orbital Region: severe depletion Upper Arm Region: severe depletion Thoracic and Lumbar Region: NA  Muscle:  Temple Region: severe depletion Clavicle Bone Region: severe depletion Clavicle and Acromion Bone Region: severe depletion Scapular Bone Region: severe depletion Dorsal Hand: severe depletion Patellar Region: severe depletion Anterior Thigh Region: severe depletion Posterior Calf Region: severe depletion  Edema: none  Labs reviewed: Low Creatinine  Height: Ht Readings from Last 1 Encounters:  04/02/15 5\' 10"  (1.778 m)    Weight: Wt Readings from Last 1 Encounters:  04/05/15 118 lb (53.524 kg)    Ideal Body Weight: 166 lb  % Ideal Body Weight: 71%  Wt Readings from Last 10 Encounters:  04/05/15 118 lb (53.524 kg)  03/17/15 127 lb 14.4 oz (58.015 kg)    Usual Body Weight: 165 lb  % Usual Body Weight: 72%  BMI:  Body mass index is 16.93 kg/(m^2).  Estimated Nutritional Needs: Kcal: 1700-1900 Protein: 80-90g Fluid: 1.7L/day  Skin: Stage II pressure ulcer  Diet Order: Diet NPO time specified Except for: Sips with Meds  EDUCATION NEEDS: -Education needs addressed   Intake/Output Summary (Last 24 hours) at 04/07/15 0945 Last data filed at 04/06/15 2028  Gross per 24 hour  Intake     40 ml  Output      0 ml  Net     40 ml    Last BM: 4/19  Labs:   Recent Labs Lab 04/01/15 0520 04/02/15 0628 04/03/15 0435 04/04/15 0408  NA 133*  135 135 138  K 3.8 3.4* 3.3* 3.8  CL 94* 93* 95* 97  CO2 25 34* 35* 34*  BUN <5* <5* 7 6  CREATININE 0.66 0.57 0.42* 0.46*  CALCIUM 8.9 9.0 8.8 9.2  MG 1.3* 1.6 1.7  --   GLUCOSE 92 117* 91 137*    CBG (last 3)   Recent Labs  04/06/15 1957 04/07/15 0018 04/07/15 0442  GLUCAP 100* 100* 103*    Scheduled Meds: . chlorhexidine  15 mL Mouth/Throat BID  . enoxaparin (LOVENOX) injection  40 mg Subcutaneous Q24H  . ergocalciferol  50,000 Units Oral Weekly  .  fentaNYL  50 mcg Transdermal Q72H  . folic acid  1 mg Intravenous Daily  . guaiFENesin  15 mL Oral QID  . multivitamin  5 mL Oral Daily  . nicotine  21 mg Transdermal Daily  . scopolamine  1 patch Transdermal Q72H  . thiamine  100 mg Oral Daily   Or  . thiamine  100 mg Intravenous Daily    Continuous Infusions: . feeding supplement (OSMOLITE 1.2 CAL) 1,000 mL (04/06/15 2013)  . sodium chloride irrigation      Past Medical History  Diagnosis Date  . Hypertension   . Cancer 03/16/15    left cervical lymph node  . Shortness of breath dyspnea   . GERD (gastroesophageal reflux disease)   . Seizures     Past Surgical History  Procedure Laterality Date  . Knee surgery  1998    Left  . Tonsillectomy and adenoidectomy    . Lymph node biopsy Left 03/16/15    left cervical, consistent with squamous cell carcinoma  . Hernia repair      right groin  . Tonsillectomy    . Tracheostomy tube placement N/A 03/26/2015    Procedure: TRACHEOSTOMY ;  Surgeon: [name], MD;  Location: [place];  Service: ENT;  Laterality: N/A;  . Tooth extraction  03/26/2015    Procedure: Chest[place];  Surgeon: [name], MD;  Location: [place];  Service: ENT;;  . Multiple extractions with alveoloplasty N/A 03/26/2015    Procedure: EXTRACTION OF TOOTH #'S 1,2,5,6,7,8,9,10,11,12,13,18,19,21,22,23,24,25,26,27,28,29  WITH AVELOPLASTY;  Surgeon: Lenn Cal, DDS;  Location: [place];  Service: Oral Surgery;  Laterality: N/A;  . Gastrostomy N/A 03/31/2015    Procedure: OPEN GASTROSTOMY TUBE PLACEMENT;  Surgeon: Rolm Bookbinder, MD;  Location: [place];  Service: General;  Laterality: N/A;    Clayton Bibles, MS, RD, LDN Pager: [phone] After Hours Pager: [phone]

## 2015-04-07 NOTE — Progress Notes (Addendum)
CSW continuing to follow.  Pt continues to adamantly decline SNF placement and plans to return to [facility] with [facility] services.   Per RN, pt is calling [facility] transportation for discharge to [facility] and to set up transportation to radiation treatments.   CSW provided documentation for ambulance transport if pt needs ambulance transport home. RN has phone number to arrange ambulance transport if needed.  CSW to continue to follow.   Alison Murray, MSW, [place] Work [phone]

## 2015-04-07 NOTE — Progress Notes (Signed)
Patient left AMA.  Dr. Darrick Meigs notified.  Lorrene Reid

## 2015-04-07 NOTE — Discharge Summary (Signed)
Physician Discharge Summary  Kyle Alexander TGP:[id number] DOB: [birth year] DOA: 03/26/2015  PCP: Pcp Not In System  Admit date: 03/26/2015 Discharge date: 04/07/2015  Time spent: 89minutes  Recommendations for Outpatient Follow-up:  Patient left AGAINST MEDICAL ADVICE  Discharge Diagnoses:  Active Problems:   HTN (hypertension)   Tobacco abuse   Alcohol abuse   Hypokalemia   Marijuana abuse   Folate deficiency   Protein-calorie malnutrition, severe   Vitamin D deficiency   Squamous cell carcinoma of supraglottis   Head and neck cancer   Pharyngeal cancer   Hypomagnesemia   Tracheostomy care    Filed Weights   04/02/15 0538 04/02/15 1655 04/05/15 0617  Weight: 62.4 kg (137 lb 9.1 oz) 62.143 kg (137 lb) 53.524 kg (118 lb)    History of present illness:   54 year old man with history of hypertension, tobacco, and alcohol who presented with complaint of an enlarging neck mass, progressive dysphagia, and weight loss for the past several months. Patient was previously seen in urgent care on 07/21/2014 with complaint of progressive discomfort with swallowing and swollen cervical lymph nodes; although further workup was advised then, the patient left and did not return for evaluation. He reports dysphagia to solids, but says he is able to get liquids down. He does not have a primary care physician.  Hospital Course:  1. Squamous cell carcinoma of the nasopharynx, hypopharynx and larynx- status post tracheostomy and multiple dental extractions on 4/14 G tube placement on 4/19. Patient transferred to the [facility] long hospital  for palliative radiation treatment. Outpatient PET scan has been scheduled for 4/22, will have to be rescheduled at the time of discharge. Continued fentanyl patch every 72 hours and when necessary Dilaudid 1 mg every 4 hours when necessary. Scopolamine patch for increased secretions. Continue guaifenesin, chest PT.CT simulation today, radiation treatment to begin on  04/09/15.Patient to call for appointment in 2-3 weeks for oral examination during radiation therapy. 2. Status post dental extraction- patient was seen by dental medicine this morning, recommend continuing salt water rinses to aid healing. Patient will need to schedule suture removal in dental medicine clinic early next week. Patient cleared for start of radiation therapy on 04/09/2015. 3. Nutrition- patient had PEG tube in place, on Osmolite 1.2 at 65 mL per hour. 4. Hypokalemia- replete, repeat k is 3.8 on 04/04/15 5. Anemia- likely acute blood loss anemia hemoglobin 9.8 on 04/03/2015 dropped from 13.9 on admission. FOBT pending as of 4/25 6. Tobacco abuse- continued nicotine patch 7. Stage 2 pressure injury from trach collar- Wound care consulted.  8. Alcohol abuse- Continue CIWA protocol. No signs and symptoms of alcohol withdrawal  9. thoracic aortic dilation- patient has 4 cm ascending aortic dilation seen on the CT chest, will need follow-up CTA or MRA 1 year  Patient Left AGAINST MEDICAL ADVICE Patient was offered to go to skilled nursing facility since he was requiring tube feedings, trach care, and was supposed to get radiation treatment starting next week. Patient declined the offer to go to the skilled facility. He was adamant to go to the [place] [facility] with home health services. Case management tried to coordinate the home health services for the patient today, unfortunately [facility]  home care was not ready for the patient to be discharged today. They had told that they can provide the services on 04/08/2015.  When patient was told about the decision of home health services, he refused to stay in the hospital for 1 more day.  Left AGAINST  MEDICAL ADVICE, understanding that patient is at risk of death without getting the proper trach care and tube feeding. Patient understood the risks and still decided to leave [place]. Patient sister informed on telephone regarding  patient's decision.   Procedures:  G-tube placement  Tracheostomy  Multiple dental extractions  Consultations:  Surgery  ENT  Dental medicine   Oncology  Palliative care   Discharge Exam: Filed Vitals:   04/07/15 1507  BP:   Pulse: 95  Temp:   Resp: 20        The results of significant diagnostics from this hospitalization (including imaging, microbiology, ancillary and laboratory) are listed below for reference.    Significant Diagnostic Studies: Dg Chest 2 View  03/15/2015   CLINICAL DATA:  Chest pain, cough  EXAM: CHEST  2 VIEW  COMPARISON:  None.  FINDINGS: The heart size and mediastinal contours are within normal limits. Both lungs are clear. The visualized skeletal structures are unremarkable.  IMPRESSION: No active cardiopulmonary disease.   Electronically Signed   By: Inez Catalina M.D.   On: 03/15/2015 08:14   Ct Soft Tissue Neck W Contrast  03/15/2015   CLINICAL DATA:  Weight loss. Neck mass. Swelling. Short of breath.  EXAM: CT NECK WITH CONTRAST  TECHNIQUE: Multidetector CT imaging of the neck was performed using the standard protocol following the bolus administration of intravenous contrast.  CONTRAST:  188mL OMNIPAQUE IOHEXOL 300 MG/ML  SOLN  COMPARISON:  None.  FINDINGS: I think there is an extensive circumferential malignancy in the hypo pharyngeal/supraglottic region. This is difficult to measure precisely because of a superficial spreading pattern. The most bulky tumor is in the right supraglottic region.  There are multiple enlarged necrotic lymph nodes consistent with metastatic involvement. This includes retropharyngeal and parapharyngeal nodes, level 2 nodes bilaterally, level 3 and left-sided level for lymph nodes. The largest nodal mass is a a right level 2 conglomeration measuring 5.2 x 3.8 x 3.5 cm in diameter.  Parotid glands are normal. Submandibular glands are normal. Thyroid gland contains a 13 mm cyst or nodule in the left lobe. Carotid arteries  and jugular veins are patent, though of both jugular veins show extrinsic compression by large nodal masses.  No significant bony finding.  IMPRESSION: I think there is an advanced hypopharyngeal/ supraglottic mass, largely with a superficial spreading pattern. Massive bilateral necrotic metastatic lymphadenopathy.  I did consider other possible etiologies such as tuberculosis, but think that is less likely.   Electronically Signed   By: Nelson Chimes M.D.   On: 03/15/2015 09:11   Ct Angio Chest Pe W/cm &/or Wo Cm  03/15/2015   CLINICAL DATA:  Weight loss with hemoptysis  EXAM: CT ANGIOGRAPHY CHEST WITH CONTRAST  TECHNIQUE: Multidetector CT imaging of the chest was performed using the standard protocol during bolus administration of intravenous contrast. Multiplanar CT image reconstructions and MIPs were obtained to evaluate the vascular anatomy.  CONTRAST:  111mL OMNIPAQUE IOHEXOL 300 MG/ML  SOLN  COMPARISON:  None.  FINDINGS: The lungs are well aerated bilaterally without focal infiltrate or sizable effusion. No parenchymal mass lesion is seen. The thoracic aorta and its branches are within normal limits. Prominence of the ascending aorta to 4 cm is noted. No dissection is identified. Normal tapering is noted distally. The pulmonary artery is within normal limits without evidence of filling defect to suggest pulmonary embolism. No significant hilar or mediastinal adenopathy is noted.  The thoracic inlet shows evidence of lymphadenopathy similar to that  seen on the recent CT of the neck. The dominant lymph node is noted adjacent to the left common carotid artery best seen on image number 31 of series 406. It measures 2.9 x 2.0 cm in dimension.  The visualized upper abdomen shows evidence of cholelithiasis. Area decreased attenuation is noted within the liver adjacent to the falciform ligament consistent with focal fatty infiltration. No acute bony abnormality is noted.  Review of the MIP images confirms the above  findings.  IMPRESSION: No evidence of pulmonary embolism.  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 Recommend annual imaging followup by CTA or MRA. This recommendation follows 2010 ACCF/AHA/AATS/ACR/ASA/SCA/SCAI/SIR/STS/SVM Guidelines for the Diagnosis and Management of Patients with Thoracic Aortic Disease. Circulation. 2010; 121: P809-X833  Left supraclavicular adenopathy.  Cholelithiasis.   Electronically Signed   By: Inez Catalina M.D.   On: 03/15/2015 09:18   US Biopsy  03/16/2015   CLINICAL DATA:  54 year old with probable head and neck cancer. Bilateral cervical lymphadenopathy. Tissue diagnosis is needed.  EXAM: ULTRASOUND-GUIDED BIOPSY OF LEFT CERVICAL LYMPH NODES  Physician: Stephan Minister. Anselm Pancoast, MD  FLUOROSCOPY TIME:  None  MEDICATIONS: None  ANESTHESIA/SEDATION: Moderate sedation time: None  PROCEDURE: The procedure was explained to the patient. The risks and benefits of the procedure were discussed and the patient's questions were addressed. Informed consent was obtained from the patient. Ultrasound demonstrated multiple large left cervical lymph nodes. Left side of the neck was prepped and draped in sterile fashion. Skin was anesthetized with 1% lidocaine. Using ultrasound guidance, 4 core biopsies were obtained from a large left cervical lymph node in the upper neck with an 18 gauge device. Specimens were placed in formalin and saline. The core material appeared to be very necrotic and scant. As a result, 4 ultrasound-guided fine-needle aspirations were obtained from additional left cervical lymph nodes with 25 gauge needles. Bandages placed at the puncture sites.  FINDINGS: Multiple enlarged cervical lymph nodes on the left side. Dominant lesion in the left upper cervical lymph node was sampled with the core device. This dominant lymph node measures at least 2.6 cm. FNAs obtained from an adjacent left upper cervical lymph node and a left supraclavicular lymph node.  COMPLICATIONS: None  IMPRESSION:  Ultrasound-guided core biopsies and fine-needle aspirations of left cervical lymph nodes.   Electronically Signed   By: Markus Daft M.D.   On: 03/16/2015 17:54   Ir Fluoro Rm 30-60 Min  03/30/2015   CLINICAL DATA:  Advanced head and neck malignancy, malnutrition  EXAM: FLUOROSCOPIC EXAM OF THE STOMACH. (UNABLE TO PLACE PERCUTANEOUS GASTROSTOMY)  Date:  4/18/20164/18/2016 2:31 pm  Radiologist:  M. Daryll Brod, MD  Guidance:  Fluoroscopic  FLUOROSCOPY TIME:  1 minutes 18 seconds, 9 mGy  MEDICATIONS AND MEDICAL HISTORY: 1 mg Versed, 50 mcg fentanyl  ANESTHESIA/SEDATION: 15 minutes  CONTRAST:  3mL OMNIPAQUE IOHEXOL 300 MG/ML  SOLN  COMPLICATIONS: None immediate  PROCEDURE: Informed consent was obtained from the patient following explanation of the procedure, risks, benefits and alternatives. The patient understands, agrees and consents for the procedure. All questions were addressed. A time out was performed.  Initially, a 5 French catheter was advanced through the oral cavity into the stomach under fluoroscopy. This was performed after conscious sedation. This was utilized to insufflate the stomach with air. Stomach was noted to be high in the abdomen beneath the left subcostal margin despite adequate insufflation of the stomach with air. The stomach does not extend inferiorly below the subcostal margin to allow percutaneous  needle access safely. Therefore the procedure cannot be performed with percutaneous fluoroscopic technique.  IMPRESSION: High positioned stomach beneath the left subcostal margin, not accessible safely by percutaneous fluoroscopic technique without risking bowel injury.  PLAN: Findings discussed with the patient's medical staff. Recommend surgical consultation for gastrostomy placement.  COMPARISON:  03/16/2015   Electronically Signed   By: Jerilynn Mages.  Shick M.D.   On: 03/30/2015 14:54   Dg Chest Port 1 View  03/27/2015   CLINICAL DATA:  Respiratory failure.  EXAM: PORTABLE CHEST - 1 VIEW   COMPARISON:  03/15/2015  FINDINGS: Tracheostomy tube has been inserted. Heart size and pulmonary vascularity are normal. The lungs are clear. No osseous abnormality.  IMPRESSION: Tracheostomy tube in good position.  Clear lungs.   Electronically Signed   By: Lorriane Shire M.D.   On: 03/27/2015 07:49    Microbiology: No results found for this or any previous visit (from the past 240 hour(s)).   Labs: Basic Metabolic Panel:  Recent Labs Lab 04/01/15 0520 04/02/15 0628 04/03/15 0435 04/04/15 0408  NA 133* 135 135 138  K 3.8 3.4* 3.3* 3.8  CL 94* 93* 95* 97  CO2 25 34* 35* 34*  GLUCOSE 92 117* 91 137*  BUN <5* <5* 7 6  CREATININE 0.66 0.57 0.42* 0.46*  CALCIUM 8.9 9.0 8.8 9.2  MG 1.3* 1.6 1.7  --    Liver Function Tests: No results for input(s): AST, ALT, ALKPHOS, BILITOT, PROT, ALBUMIN in the last 168 hours. No results for input(s): LIPASE, AMYLASE in the last 168 hours. No results for input(s): AMMONIA in the last 168 hours. CBC:  Recent Labs Lab 04/01/15 0520 04/03/15 0435  WBC 10.5 8.5  HGB 10.6* 9.8*  HCT 30.9* 29.5*  MCV 95.1 98.0  PLT 225 294   Cardiac Enzymes: No results for input(s): CKTOTAL, CKMB, CKMBINDEX, TROPONINI in the last 168 hours. BNP: BNP (last 3 results) No results for input(s): BNP in the last 8760 hours.  ProBNP (last 3 results) No results for input(s): PROBNP in the last 8760 hours.  CBG:  Recent Labs Lab 04/06/15 1647 04/06/15 1957 04/07/15 0018 04/07/15 0442 04/07/15 1255  GLUCAP 94 100* 100* 103* 108*       Signed:  Younis Mathey S  [facility] 04/07/2015, 4:45 PM

## 2015-04-07 NOTE — Care Management Note (Signed)
CARE MANAGEMENT NOTE 04/07/2015  Patient:  Kyle Alexander, Kyle Alexander   Account Number:  [id number]  Date Initiated:  03/27/2015  Documentation initiated by:  Elissa Hefty  Subjective/Objective Assessment:   adm w plan for trach,peg,dental extractions     Action/Plan:   lives alone in [place], has supp sister   Anticipated DC Date:  04/07/2015   Anticipated DC Plan:  [place]  CM consult      [facility] Choice  HOME HEALTH   Choice offered to / List presented to:  C-1 Patient   DME arranged  TUBE FEEDING  [place]      DME agency  [place] arranged  HH-1 RN  [place]      [place]   Status of service:  In process, will continue to follow Medicare Important Message given?   (If response is "NO", the following Medicare IM given date fields will be blank) Date Medicare IM given:   Medicare IM given by:   Date Additional Medicare IM given:   Additional Medicare IM given by:    Discharge Disposition:    Per UR Regulation:  Reviewed for med. necessity/level of care/duration of stay  If discussed at [place] of Stay Meetings, dates discussed:   03/31/2015  04/02/2015    Comments:  04/07/15 Marney Doctor RN,BSN,NCM [phone] Pt leaving AMA this afternoon. Pt encouraged to stay until tomorrow when [facility] can come and DME equipment can be delivered. Pt informed that if he goes home with a new trach and has no suction at home he could die if he is unable to clear his secreations. Pt states he understands and still plans to go home.  Pt is [place] pt with [facility]. [name] unable to see pt until 04/08/15. Pt to DC back to [facility] with HHRN/SW/Resp.therapy. [facility] to deliver suction equipment, trach care supplies and tube feeding supplies on 4/27 am. Pt states that he has a friend that lives next door to him that also has a trach and will be helping him learn  how to take care of it.  Pt has [facility] transportation to get to and from his radiation appointments. TF teaching done by nursing prior to DC. Pt taught trach care and suctioning by trach team. Pt set up for outpt trach clinic. MD made aware of inability of [facility] to see him until 4/27 and pt leaving AMA.   03-27-15 Spoke with patient and his sister at bedside. Patient lives alone in [place] at a hotel . Patient's sister and brother both live in [place] . Explained RT will have to teach patient and someone else trach care before discharge and bedside nurse will begin teaching tube feedings .  Patient and sister feel this is "too Much" for patient to handle alone at discharge . Patient does have friends in the area , however, they all work.   Family unable to stay in area due to work. Patient's sister asked if patient could go to SNF for short term rehab . Explained will ask SW . SW Eric aware . Magdalen Spatz RN BSN [phone]

## 2015-04-07 NOTE — Progress Notes (Signed)
Pt's sister [name] called from [place]. Stated that patient has no one to take care of him at home and he is not able to take care of his trach and tube feeding. She is concerned that patient can not take care of himself at home. Pt stated that he is going home tomorrow (Tuesday). He cont to put his bed up to the ceiling even though we keep putting it down. Pt is very noncompliant. Will cont to monitor.

## 2015-04-08 DIAGNOSIS — Z510 Encounter for antineoplastic radiation therapy: Secondary | ICD-10-CM | POA: Diagnosis present

## 2015-04-08 DIAGNOSIS — F17210 Nicotine dependence, cigarettes, uncomplicated: Secondary | ICD-10-CM | POA: Diagnosis not present

## 2015-04-08 DIAGNOSIS — L598 Other specified disorders of the skin and subcutaneous tissue related to radiation: Secondary | ICD-10-CM | POA: Diagnosis not present

## 2015-04-08 DIAGNOSIS — F419 Anxiety disorder, unspecified: Secondary | ICD-10-CM | POA: Diagnosis not present

## 2015-04-08 DIAGNOSIS — C321 Malignant neoplasm of supraglottis: Secondary | ICD-10-CM | POA: Diagnosis not present

## 2015-04-08 DIAGNOSIS — Z931 Gastrostomy status: Secondary | ICD-10-CM | POA: Diagnosis not present

## 2015-04-09 ENCOUNTER — Telehealth: Payer: Self-pay | Admitting: *Deleted

## 2015-04-09 ENCOUNTER — Encounter: Payer: Self-pay | Admitting: *Deleted

## 2015-04-09 NOTE — Telephone Encounter (Addendum)
Miguel Rota LCSW [facility] called to facilitate my conversation with patient prior to her departure. 1. In response to my inquiry, he indicated he:  Feels safe at his current residence.  Is able to manage trach care.  Is able to manage bolus feedings via PEG.  Understands he can contact me with concerns/needs. 2. I provided Catalina Lunger my contact information should their staff need it. 3. As alternative to patient's mobile phone and to reduce minutes used, patient can be reached at [place] Room 507.  Gayleen Orem, RN, BSN, [place] at [place] [phone]

## 2015-04-09 NOTE — Progress Notes (Signed)
[place] Work  Clinical Social Work received phone call from patient's sister, [name]. [name] shared her frustration with patient's current situation.  Patient's sister shared patient is staying at extended stay hotel and she has paid for him to stay there until next Wednesday.  She reported the patient is not answering her phone calls and she is fearful he is going to die in motel and nobody will know.  CSW allowed patient's sister to share her frustrations and fears.  Patient's sister and brother plan to visit with patient tomorrow to talk about concerns.  CSW encouraged patient's sister to make APS referral if concerned for patient's safety and capacity to make own decisions.  CSW reinforced patient not having capacity vs. Patient not making best judgement.  CSW contacted [facility] Case manager to determine if [place] referral was made for home safety assessment and support.  [facility] CM made referral for [facility] and HHSW and HHRN to visit daily.  CSW left voicemail with Tamsen Meek RN Manager at [facility] to discuss case.  Polo Riley, MSW, LCSW, OSW-C Clinical Social Worker [facility] [phone]

## 2015-04-09 NOTE — Telephone Encounter (Signed)
To provide support and encouragement, care continuity and to assess for needs, called patient s/p 04/07/15 AMA discharge.  At the time of my call he was being visited by [facility].  He asked that I call again later.  Gayleen Orem, RN, BSN, [place] at [place] [phone]

## 2015-04-13 ENCOUNTER — Telehealth: Payer: Self-pay | Admitting: *Deleted

## 2015-04-13 DIAGNOSIS — Z510 Encounter for antineoplastic radiation therapy: Secondary | ICD-10-CM | POA: Diagnosis not present

## 2015-04-13 NOTE — Telephone Encounter (Signed)
Patient's sister, Tyrin Herbers, called me Friday 04/10/15 in follow-up to her 04/09/15 conversation with LCSW Polo Riley.  1. She expressed concerns about her brother's:  Immediate and future living situation,   Ability to manage self-care,   Understanding and willingness to begin/complete planned RT. 2. She stated that she and her brother are planning to come to his current residence on Saturday to discuss their concerns but expressed doubt that he would be willing to see/talk with them. 3. She indicated she would call/email me with an update of their visit.  Gayleen Orem, RN, BSN, [place] at [place] [phone]

## 2015-04-15 ENCOUNTER — Ambulatory Visit: Admission: RE | Admit: 2015-04-15 | Payer: Medicaid Other | Source: Ambulatory Visit | Admitting: Radiation Oncology

## 2015-04-15 ENCOUNTER — Encounter: Payer: Self-pay | Admitting: *Deleted

## 2015-04-15 ENCOUNTER — Ambulatory Visit (HOSPITAL_COMMUNITY)
Admit: 2015-04-15 | Discharge: 2015-04-15 | Disposition: A | Discharge status: Home / Self Care | Payer: Medicaid Other | Source: Ambulatory Visit | Attending: Acute Care | Admitting: Acute Care

## 2015-04-15 ENCOUNTER — Telehealth: Payer: Self-pay | Admitting: *Deleted

## 2015-04-15 ENCOUNTER — Encounter: Payer: Self-pay | Admitting: Radiation Oncology

## 2015-04-15 ENCOUNTER — Inpatient Hospital Stay
Admission: RE | Admit: 2015-04-15 | Discharge: 2015-04-15 | Disposition: A | Discharge status: Home / Self Care | Payer: Self-pay | Source: Ambulatory Visit | Attending: Radiation Oncology | Admitting: Radiation Oncology

## 2015-04-15 VITALS — BP 110/85 | HR 108 | Temp 98.9°F

## 2015-04-15 DIAGNOSIS — Z931 Gastrostomy status: Secondary | ICD-10-CM | POA: Insufficient documentation

## 2015-04-15 DIAGNOSIS — F17210 Nicotine dependence, cigarettes, uncomplicated: Secondary | ICD-10-CM | POA: Diagnosis not present

## 2015-04-15 DIAGNOSIS — K219 Gastro-esophageal reflux disease without esophagitis: Secondary | ICD-10-CM | POA: Diagnosis not present

## 2015-04-15 DIAGNOSIS — Z8249 Family history of ischemic heart disease and other diseases of the circulatory system: Secondary | ICD-10-CM | POA: Diagnosis not present

## 2015-04-15 DIAGNOSIS — I10 Essential (primary) hypertension: Secondary | ICD-10-CM | POA: Insufficient documentation

## 2015-04-15 DIAGNOSIS — Z930 Tracheostomy status: Secondary | ICD-10-CM | POA: Diagnosis not present

## 2015-04-15 DIAGNOSIS — Z430 Encounter for attention to tracheostomy: Secondary | ICD-10-CM | POA: Diagnosis present

## 2015-04-15 DIAGNOSIS — C321 Malignant neoplasm of supraglottis: Secondary | ICD-10-CM

## 2015-04-15 DIAGNOSIS — Z809 Family history of malignant neoplasm, unspecified: Secondary | ICD-10-CM | POA: Diagnosis not present

## 2015-04-15 DIAGNOSIS — Z833 Family history of diabetes mellitus: Secondary | ICD-10-CM | POA: Diagnosis not present

## 2015-04-15 DIAGNOSIS — C329 Malignant neoplasm of larynx, unspecified: Secondary | ICD-10-CM | POA: Insufficient documentation

## 2015-04-15 DIAGNOSIS — C760 Malignant neoplasm of head, face and neck: Secondary | ICD-10-CM | POA: Diagnosis not present

## 2015-04-15 MED ORDER — LORAZEPAM 0.5 MG PO TABS: ORAL_TABLET | ORAL | Status: DC

## 2015-04-15 NOTE — Consult Note (Signed)
CC: trach clinic   Brief History  54 year old man with history of hypertension, tobacco, alcohol and Squamous cell carcinoma of the nasopharynx, hypopharynx and larynx- status post tracheostomy and multiple dental extractions on 4/14 G tube placement on 4/19. Presents to trach clinic in effort to establish care. To date he has yet to initiate XRT successfully either d/t secretions OR anxiety/ mix of both.    Past Medical History  Diagnosis Date  . Hypertension   . Cancer 03/16/15    left cervical lymph node  . Shortness of breath dyspnea   . GERD (gastroesophageal reflux disease)   . Seizures    Family History  Problem Relation Age of Onset  . Cancer Mother   . Diabetes Mother   . Diabetes Father   . Hypertension Father    Allergies  Allergen Reactions  . Aspirin     Makes me bleed   History   Social History  . Marital Status: Single    Spouse Name: N/A  . Number of Children: N/A  . Years of Education: N/A   Occupational History  . Not on file.   Social History Main Topics  . Smoking status: Current Every Day Smoker -- 2.00 packs/day for 35 years  . Smokeless tobacco: Former Systems developer    Types: Chew  . Alcohol Use: 18.0 oz/week    30 Standard drinks or equivalent per week     Comment: occasional  . Drug Use: Yes    Special: Marijuana     Comment: occasional   . Sexual Activity: Not Currently   Other Topics Concern  . Not on file   Social History Narrative   Lives alone, unemployed. Previously worked in Architect.   @[employer] @  Review of Systems:   Bolds are positive  Constitutional: weight loss, gain, night sweats, Fevers, chills, fatigue .  HEENT: headaches, Sore throat, sneezing, nasal congestion, post nasal drip, Difficulty swallowing, Tooth/dental problems, visual complaints visual changes, ear ache CV:  chest pain, radiates: ,Orthopnea, PND, swelling in lower extremities, dizziness, palpitations, syncope.  GI  heartburn, indigestion, abdominal  pain, nausea, vomiting, diarrhea, change in bowel habits, loss of appetite, bloody stools.  Resp: cough, productive: , hemoptysis, dyspnea, chest pain, pleuritic. + clear secretions Skin: rash or itching or icterus GU: dysuria, change in color of urine, urgency or frequency. flank pain, hematuria  MS: joint pain or swelling. decreased range of motion  Psych: change in mood or affect. depression or anxiety.  Neuro: difficulty with speech, weakness, numbness, ataxia   There were no vitals taken for this visit.  General appearance: chronically ill appearing AAM, currently no acute distress  Mouth:  membranes and no mucosal ulcerations; normal hard and soft palate Neck: Trachea midline, #  4 trach, cuffless trach stoma unremarkable. Large golf ball sized visible mass L>R above stoma/trach  Lungs: scattered rhonchi that cl w/ cough.  with normal respiratory effort and no intercostal retractions. Weak phonation  CV: RRR, no MRGs  Abdomen: Soft, non-tender; no masses or HSM Extremities: No peripheral edema or extremity lymphadenopathy Skin: Normal temperature, turgor and texture; no rash, ulcers or subcutaneous nodules Psych: Appropriate affect, alert and oriented to person, place and time  Date  Last Seen by:  Lurline Idol size Trach change   pmv  Capping  Stoma assessment  Diet  Goals   5/4 Evanthia Maund 4 cuffless no yes  Clean and unremarkable NPO  Impressio/plan  Tracheostomy status in setting of head and neck cancer.  Currently has not been able to tolerate any XRT. Unless things significantly change I do not see him as a candidate for decannulation.  Plan -We will see him again in 6 weeks for routine trach change -Will follow along on his progress w/ XRT via his EPIC notes. Unless he makes remarkable improvement I do not see him as a safe decannulation candidate. If this changes I would still consult one of my colleagues either Dr Titus Mould or Nelda Marseille as well as discuss this w/  rad Onc prior to consideration.   Erick Colace ACNP-BC [place] Pager # [phone] OR # [phone] if no answer

## 2015-04-15 NOTE — Progress Notes (Signed)
Tracheostomy Procedure Note  Kyle Alexander [id number] 09-Nov-1961  Pre Procedure Tracheostomy Information  Trach Brand: Shiley Size: 4.0 Style: Uncuffed Secured by: Velcro   Procedure: Trach cleaning and education    Post Procedure Tracheostomy Information  Trach Brand: Shiley Size: 4.0 Style: Uncuffed Secured by: Velcro   Post Procedure Evaluation:  ETCO2 positive color change from yellow to purple :  Vital signs:blood pressure 110/90  BP, pulse 91, respirations 16 and pulse oximetry 99 % Patients current condition: stable Complications: No apparent complications Trach site exam: dry Wound care done: dry Patient did tolerate procedure well.   Education: none  Prescription needs: none    Additional needs: none

## 2015-04-15 NOTE — Progress Notes (Signed)
Saw patient briefly due to inability to tolerate RT mask.  He would like to re-attempt tomorrow.  Felt anxious and claustrophobic  See Nursing note: Mr. Runyon was unable to tolerate lying flat on the treatment table due to sensation of choking while in the head-cast. He was suctioned prior to the attempted treatment without any secretions.  In the middle of the treatment he waved his hand and the therapist and this RN entered the room.  He stated he" could not do this today" and did respond yes, when asked if he had any anxiety.  Assured him that during treatment, not only will he be suctioned as was done today, but a mask supplying oxygen would be placed over the trach to ease his respiratory discomfort.  He stated agreement with this plan.  When asked, he stated he would like to have medication for anxiety during treatment.  Escorted to nursing to see Dr. Isidore Moos.  Plan: Rx Ativan, and give O2 per [place] tomorrow.   -----------------------------------  [name], MD

## 2015-04-15 NOTE — Progress Notes (Signed)
Kyle Alexander was unable to tolerate lying flat on the treatment table due to sensation of choking while in the head-cast. He was suctioned prior to the attempted treatment without any secretions.  In the middle of the treatment he waved his hand and the therapist and this RN entered the room.  He stated he" could not do this today" and did respond yes, when asked if he had any anxiety.  Assured him that during treatment, not only will he be suctioned as was done today, but a mask supplying oxygen would be placed over the trach to ease his respiratory discomfort.  He stated agreement with this plan.  When asked, he stated he would like to have medication for anxiety during treatment.  Escorted to nursing to see Dr. Isidore Moos.

## 2015-04-15 NOTE — Telephone Encounter (Signed)
Called patient, confirmed his arrival for today's 12:00 New Start Tomo.  Gayleen Orem, RN, BSN, [place] at [place] [phone]

## 2015-04-16 ENCOUNTER — Ambulatory Visit: Payer: Medicaid Other

## 2015-04-16 ENCOUNTER — Telehealth: Payer: Self-pay | Admitting: *Deleted

## 2015-04-16 ENCOUNTER — Ambulatory Visit (HOSPITAL_COMMUNITY)
Admission: RE | Admit: 2015-04-16 | Discharge: 2015-04-16 | Disposition: A | Discharge status: Home / Self Care | Payer: Medicaid Other | Source: Ambulatory Visit | Attending: Radiation Oncology | Admitting: Radiation Oncology

## 2015-04-16 ENCOUNTER — Ambulatory Visit
Admission: RE | Admit: 2015-04-16 | Discharge: 2015-04-16 | Disposition: A | Discharge status: Home / Self Care | Payer: Medicaid Other | Source: Ambulatory Visit | Attending: Radiation Oncology | Admitting: Radiation Oncology

## 2015-04-16 ENCOUNTER — Other Ambulatory Visit: Payer: Self-pay | Admitting: Radiation Oncology

## 2015-04-16 ENCOUNTER — Encounter: Payer: Self-pay | Admitting: *Deleted

## 2015-04-16 ENCOUNTER — Encounter: Payer: Self-pay | Admitting: Radiation Oncology

## 2015-04-16 VITALS — Wt 111.1 lb

## 2015-04-16 DIAGNOSIS — Z431 Encounter for attention to gastrostomy: Secondary | ICD-10-CM | POA: Diagnosis present

## 2015-04-16 DIAGNOSIS — C140 Malignant neoplasm of pharynx, unspecified: Secondary | ICD-10-CM

## 2015-04-16 DIAGNOSIS — Z510 Encounter for antineoplastic radiation therapy: Secondary | ICD-10-CM | POA: Diagnosis not present

## 2015-04-16 MED ORDER — IOHEXOL 300 MG/ML  SOLN
50.0000 mL | Freq: Once | INTRAMUSCULAR | Status: AC | PRN
  Administered 2015-04-16: 20 mL via ORAL

## 2015-04-16 NOTE — Progress Notes (Signed)
Patient was brought to nursing after 1st treatment, stating home health nurse couldn't flush his peg tuvbe to have nursing check, patient has drainage around circular  plastic securing the peg tube, attempted to flush with gingerale,starting leaking immediately,stopped, Elmo Putt, RN changed dressing and secured , order for IR to eval and manage per Dr. Valere Dross, order placed and escorted patient who smells of alcohol, slight wobbly,attempted to hold patient arm, he declined saying "I'm okay, escorted patient into radiology ,patient asked if we had a tooth brush and paste, he will pick up soft brush tomorrow and no paste that we carry he gave verbal understanding 11:42 AM  11:41 AM

## 2015-04-16 NOTE — Telephone Encounter (Signed)
Returned patient sister's VM.   1. Listened at length to her expressed concerns re: her brother's well being, his current week-to-week living arrangement, his girlfriend's on again - off again involvement, his ability to manage self-care as treatments progress. 2. She asked for support in getting him to understand/complete a HCPOA. 3. She understands limits of information that can be shared with her.  Gayleen Orem, RN, BSN, [place] at [place] [phone]

## 2015-04-16 NOTE — Progress Notes (Signed)
Removed old dressing on feeding tube and incision area on patient's abdomen.  Put new drain sponges and 4/4 over staples.  Secured with medi pore tape.

## 2015-04-16 NOTE — Progress Notes (Signed)
To provide support and encouragement, care continuity and to assess for needs, met with patient during [place]. 1. He was unable to complete d/t anxiety, claustrophobia. SOB. 2. I escorted him to Nursing where he met with Dr. Isidore Moos.  She provided Rx for lorazepam with verbal instruction to dissolve tablet under tongue 30-40 minutes before tomorrow's tmt.  He verbalized understanding. 3. Per RN Patric Dykes, she discussed with him availability of O2 by nasal cannula tomorrow to help with SOB. 4. I met neighbor Gershon Mussel who indicated he will be assisting with some of patient's transportation.  I provided him my contact information, encourage him to call me with patient concerns.  Gayleen Orem, RN, BSN, [place] at [place] [phone]

## 2015-04-16 NOTE — Progress Notes (Signed)
RTT Margreta Journey reported that patient successfully completed New Start Tomo today.  He reported that he did not take Ativan.  Delivery of O2 during RT key to success of tmt.  Gayleen Orem, RN, BSN, [place] at [place] [phone]

## 2015-04-16 NOTE — Addendum Note (Signed)
Encounter addended by: Doreen Beam, RN on: 04/16/2015 11:42 AM<BR>     Documentation filed: Vitals Section

## 2015-04-17 ENCOUNTER — Ambulatory Visit
Admission: RE | Admit: 2015-04-17 | Discharge: 2015-04-17 | Disposition: A | Discharge status: Home / Self Care | Payer: Self-pay | Source: Ambulatory Visit | Attending: Radiation Oncology | Admitting: Radiation Oncology

## 2015-04-17 ENCOUNTER — Encounter: Payer: Self-pay | Admitting: *Deleted

## 2015-04-17 ENCOUNTER — Ambulatory Visit
Admission: RE | Admit: 2015-04-17 | Discharge: 2015-04-17 | Disposition: A | Discharge status: Home / Self Care | Payer: Medicaid Other | Source: Ambulatory Visit | Attending: Radiation Oncology | Admitting: Radiation Oncology

## 2015-04-17 ENCOUNTER — Ambulatory Visit: Admit: 2015-04-17 | Payer: Medicaid Other

## 2015-04-17 DIAGNOSIS — C140 Malignant neoplasm of pharynx, unspecified: Secondary | ICD-10-CM

## 2015-04-17 DIAGNOSIS — Z510 Encounter for antineoplastic radiation therapy: Secondary | ICD-10-CM | POA: Diagnosis not present

## 2015-04-17 MED ORDER — BIAFINE EX EMUL
Freq: Two times a day (BID) | CUTANEOUS | Status: DC
  Administered 2015-04-17: 11:00:00 via TOPICAL

## 2015-04-17 NOTE — Progress Notes (Signed)
Pt here for patient teaching.  Pt given . Reviewed areas of pertinence such as fatigue, hair loss, nausea and vomiting, skin changes, throat changes, cough, shortness of breath, earaches and taste changes . Pt able to give teach back of to pat skin, use unscented/gentle soap and drink plenty of water,avoid applying anything to skin within 4 hours of treatment and to use an electric razor if they must shave. Pt demonstrated understanding, needs reinforcement and verbalizes understanding of information given and will contact nursing with any questions or concerns.  Given biafine to apply to treatment field when skin changes occur.Reinforced to take ativan prior to radiation treatment.Also discussed peg feeding and care.Informed he will see Dr. Isidore Moos every Monday after treatment and as needed.Nutritionist will see on regular basis.

## 2015-04-17 NOTE — Addendum Note (Signed)
Encounter addended by: [name], MD on: 04/17/2015  6:52 PM<BR>     Documentation filed: Notes Section

## 2015-04-17 NOTE — Progress Notes (Addendum)
IMRT Device Note outpatient 04/16/15 9.5 delivered field widths represent one set of IMRT treatment devices. The code is [phone].  -----------------------------------  [name], MD

## 2015-04-17 NOTE — Progress Notes (Signed)
To provide support and encouragement, care continuity and to assess for needs, met with patient during today's Tomo tmt. 1. He was unable to complete tmt d/t sinus congestion, refused offer to try again later in afternoon.  He requested afternoon appts for future tmts; RTT accommodated his request.   2. I escorted patient to Nursing for post-SIM education with RN Val Malloy 3. He indicated he understands SCAT procedure, showed me SCAT phone # and 2 SCAT cards.  He indicated confident in using SCAT for future appts if neccessary. 4. He verbalized understanding for need to complete Advance Directives.  He understands that [facility] LCSW can assist. 5. He stated he does not want his sister [name] to receive any information re: his care at [facility]. 6. I confirmed with [name] that he has qualified for financial assistance with medications.  He was to be escorted by RN Val to see [name] for final documentation. 7. He understands he can contact me with questions/conerns.  Gayleen Orem, RN, BSN, [place] at [place] [phone]

## 2015-04-20 ENCOUNTER — Ambulatory Visit
Admission: RE | Admit: 2015-04-20 | Discharge: 2015-04-20 | Disposition: A | Discharge status: Home / Self Care | Payer: Medicaid Other | Source: Ambulatory Visit | Attending: Radiation Oncology | Admitting: Radiation Oncology

## 2015-04-20 ENCOUNTER — Ambulatory Visit
Admission: RE | Admit: 2015-04-20 | Discharge: 2015-04-20 | Disposition: A | Discharge status: Home / Self Care | Payer: Self-pay | Source: Ambulatory Visit | Attending: Radiation Oncology | Admitting: Radiation Oncology

## 2015-04-20 ENCOUNTER — Encounter: Payer: Self-pay | Admitting: Radiation Oncology

## 2015-04-20 VITALS — BP 107/81 | HR 107 | Temp 98.4°F | Resp 14 | Wt 113.5 lb

## 2015-04-20 DIAGNOSIS — Z510 Encounter for antineoplastic radiation therapy: Secondary | ICD-10-CM | POA: Diagnosis not present

## 2015-04-20 DIAGNOSIS — C321 Malignant neoplasm of supraglottis: Secondary | ICD-10-CM

## 2015-04-20 MED ORDER — LIDOCAINE VISCOUS 2 % MT SOLN: OROMUCOSAL | Status: DC

## 2015-04-20 MED ORDER — SUCRALFATE 1 G PO TABS: ORAL_TABLET | ORAL | Status: DC

## 2015-04-20 MED ORDER — NICOTINE 21 MG/24HR TD PT24: MEDICATED_PATCH | TRANSDERMAL | Status: DC

## 2015-04-20 MED ORDER — NICOTINE 14 MG/24HR TD PT24: MEDICATED_PATCH | TRANSDERMAL | Status: DC

## 2015-04-20 MED ORDER — NICOTINE 7 MG/24HR TD PT24: MEDICATED_PATCH | TRANSDERMAL | Status: DC

## 2015-04-20 NOTE — Progress Notes (Signed)
He rates his pain as a 3 on a scale of 0-10. constant and dull over Neck and Jaw  Pt complains of fatigue and loss of sleep.  Pt has had dysphagia for solids. Pt hasn't been taking any food in by mouth, he is unsure if he is allowed to.  He is formula- Breeze, 6 cans per day, 61ml after each feeding and 111ml  bid H20 flush. Oral exam reveals mild erythema with white, brown and thin sputum. Skin exam reveals warm dry and intact skin.  Pt reports Diarrhea  1 time. Typically has soft bowel movements every other day. Reports a moist productive for white phlegm.    Wt Readings from Last 3 Encounters:  04/05/15 118 lb (53.524 kg)  03/17/15 127 lb 14.4 oz (58.015 kg)  04/20/15         113.5lb  BP 107/81 mmHg  Pulse 107  Temp(Src) 98.4 F (36.9 C) (Oral)  Resp 14  Wt 113 lb 8 oz (51.483 kg)  SpO2 100% Orthostatic Standing Vital Signs: BP:107/81 P: 107 Pox:100

## 2015-04-20 NOTE — Progress Notes (Signed)
Pt here for trach, GT tube, wound care.  Cleansed Trach with NS and applied DSD 4x4 to trach site.  Stoma presented with a moderate amount of yellow drainage and brown crusting. GT site cleansed with NS and applied a DSD 4x4. Site presenting with a moderate to large amount of dried crusty brown drainage.  Cleansed coccyx with NS and applied Silicone gel hydrocellular foam dressing and then ABD dressing secured with tape.  Pt states the dressing feels "better than wonderful."  Pt denied discomfort and had no complaints during all dressing changes and care.  Pt sent home with additional dressings and instructed to call nursing for further questions.

## 2015-04-20 NOTE — Progress Notes (Signed)
   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  CBCT/MVCT images/Port film x-rays were reviewed.  The chart was checked. He has tolerated 2 of his first 4 planned fractions - had trouble initially due to anxiety, tightness of mask, ?breathing trouble.  All intake via PEG, not swallowing.  Denies frank dysphagia.  Needs SLP outpatient consult.  Will also order MBSS.  Has not seen nutritionist outpatient.  Wants to quit smoking. Feels that masses in neck are growing  Physical Findings:  Wt Readings from Last 3 Encounters:  04/05/15 118 lb (53.524 kg)  03/17/15 127 lb 14.4 oz (58.015 kg)    weight is 113 lb 8 oz (51.483 kg). His oral temperature is 98.4 F (36.9 C). His blood pressure is 107/81 and his pulse is 107. His respiration is 14 and oxygen saturation is 100%.  bilateral bulky neck masses. Wet vocal quality. + PEG and Trach.  CBC    Component Value Date/Time   WBC 8.5 04/03/2015 0435   RBC 3.01* 04/03/2015 0435   HGB 9.8* 04/03/2015 0435   HCT 29.5* 04/03/2015 0435   PLT 294 04/03/2015 0435   MCV 98.0 04/03/2015 0435   MCH 32.6 04/03/2015 0435   MCHC 33.2 04/03/2015 0435   RDW 14.4 04/03/2015 0435   LYMPHSABS 1.3 03/27/2015 0256   MONOABS 0.9 03/27/2015 0256   EOSABS 0.0 03/27/2015 0256   BASOSABS 0.0 03/27/2015 0256     CMP     Component Value Date/Time   NA 138 04/04/2015 0408   K 3.8 04/04/2015 0408   CL 97 04/04/2015 0408   CO2 34*  04/04/2015 0408   GLUCOSE 137* 04/04/2015 0408   BUN 6 04/04/2015 0408   CREATININE 0.46* 04/04/2015 0408   CALCIUM 9.2 04/04/2015 0408   PROT 6.4 03/27/2015 0256   ALBUMIN 2.4* 03/27/2015 0256   AST 17 03/27/2015 0256   ALT 9 03/27/2015 0256   ALKPHOS 59 03/27/2015 0256   BILITOT 1.2 03/27/2015 0256   GFRNONAA >90 04/04/2015 0408   GFRAA >90 04/04/2015 0408     Impression:  The patient is tolerating radiotherapy.   Plan:  Continue radiotherapy as planned. See orders above by diagnosis for supportive care.  -----------------------------------  [name], MD

## 2015-04-20 NOTE — Addendum Note (Signed)
Encounter addended by: Jenene Slicker, RN on: 04/20/2015  5:40 PM<BR>     Documentation filed: Notes Section

## 2015-04-21 ENCOUNTER — Telehealth: Payer: Self-pay | Admitting: *Deleted

## 2015-04-21 ENCOUNTER — Other Ambulatory Visit (HOSPITAL_COMMUNITY): Payer: Self-pay | Admitting: Radiation Oncology

## 2015-04-21 ENCOUNTER — Ambulatory Visit
Admission: RE | Admit: 2015-04-21 | Discharge: 2015-04-21 | Disposition: A | Discharge status: Home / Self Care | Payer: Medicaid Other | Source: Ambulatory Visit | Attending: Radiation Oncology | Admitting: Radiation Oncology

## 2015-04-21 DIAGNOSIS — Z510 Encounter for antineoplastic radiation therapy: Secondary | ICD-10-CM | POA: Diagnosis not present

## 2015-04-21 DIAGNOSIS — R1314 Dysphagia, pharyngoesophageal phase: Secondary | ICD-10-CM

## 2015-04-21 NOTE — Telephone Encounter (Signed)
Called patient to inform of MBSS on 04-27-15 - arrival time - 12:45 pm @ [facility] and his nutrition appt. On 04-27-15- @ 3:15 pm with Ernestene Kiel and his visit with Garald Balding on 05-04-15 - arrival time - 1 pm with Garald Balding- spoke with patient and he is aware of these appts.

## 2015-04-21 NOTE — Telephone Encounter (Signed)
Returned neighbor's VM re: his inability to reach patient to confirm transportation.  LVM with patient's working phone #.  Gayleen Orem, RN, BSN, [place] at [place] [phone]

## 2015-04-22 ENCOUNTER — Encounter: Payer: Self-pay | Admitting: *Deleted

## 2015-04-22 ENCOUNTER — Ambulatory Visit
Admission: RE | Admit: 2015-04-22 | Discharge: 2015-04-22 | Disposition: A | Discharge status: Home / Self Care | Payer: Medicaid Other | Source: Ambulatory Visit | Attending: Radiation Oncology | Admitting: Radiation Oncology

## 2015-04-22 DIAGNOSIS — Z510 Encounter for antineoplastic radiation therapy: Secondary | ICD-10-CM | POA: Diagnosis not present

## 2015-04-22 NOTE — Progress Notes (Signed)
Called patient to inform that Dr. Isidore Moos called in the Nicotine patches and Lidocaine 2% swish to [employer].  Pt aware and will pick up tomorrow.

## 2015-04-23 ENCOUNTER — Encounter: Payer: Self-pay | Admitting: *Deleted

## 2015-04-23 ENCOUNTER — Ambulatory Visit
Admission: RE | Admit: 2015-04-23 | Discharge: 2015-04-23 | Disposition: A | Discharge status: Home / Self Care | Payer: Medicaid Other | Source: Ambulatory Visit | Attending: Radiation Oncology | Admitting: Radiation Oncology

## 2015-04-23 DIAGNOSIS — Z510 Encounter for antineoplastic radiation therapy: Secondary | ICD-10-CM | POA: Diagnosis not present

## 2015-04-23 NOTE — Progress Notes (Signed)
To provide support and encouragement, care continuity and to assess for needs, met with patient prior to tomo tmt.  He reported his transportation needs are being met by friend/neighbor.  He denied any needs at this time.  He understands he can contact me.  Gayleen Orem, RN, BSN, [place] at [place] [phone]

## 2015-04-24 ENCOUNTER — Ambulatory Visit
Admission: RE | Admit: 2015-04-24 | Discharge: 2015-04-24 | Disposition: A | Discharge status: Home / Self Care | Payer: Medicaid Other | Source: Ambulatory Visit | Attending: Radiation Oncology | Admitting: Radiation Oncology

## 2015-04-24 DIAGNOSIS — Z510 Encounter for antineoplastic radiation therapy: Secondary | ICD-10-CM | POA: Diagnosis not present

## 2015-04-27 ENCOUNTER — Ambulatory Visit (HOSPITAL_COMMUNITY): Payer: Self-pay

## 2015-04-27 ENCOUNTER — Ambulatory Visit (HOSPITAL_COMMUNITY): Admission: RE | Admit: 2015-04-27 | Payer: MEDICAID | Source: Ambulatory Visit

## 2015-04-27 ENCOUNTER — Telehealth: Payer: Self-pay | Admitting: *Deleted

## 2015-04-27 ENCOUNTER — Ambulatory Visit
Admission: RE | Admit: 2015-04-27 | Discharge: 2015-04-27 | Disposition: A | Discharge status: Home / Self Care | Payer: Medicaid Other | Source: Ambulatory Visit | Attending: Radiation Oncology | Admitting: Radiation Oncology

## 2015-04-27 ENCOUNTER — Ambulatory Visit
Admission: RE | Admit: 2015-04-27 | Discharge: 2015-04-27 | Disposition: A | Discharge status: Home / Self Care | Payer: MEDICAID | Source: Ambulatory Visit | Attending: Radiation Oncology | Admitting: Radiation Oncology

## 2015-04-27 ENCOUNTER — Ambulatory Visit: Payer: Self-pay | Admitting: Nutrition

## 2015-04-27 ENCOUNTER — Encounter: Payer: Self-pay | Admitting: *Deleted

## 2015-04-27 VITALS — BP 105/82 | HR 102 | Temp 98.6°F | Resp 18 | Wt 117.0 lb

## 2015-04-27 DIAGNOSIS — C760 Malignant neoplasm of head, face and neck: Secondary | ICD-10-CM

## 2015-04-27 DIAGNOSIS — Z510 Encounter for antineoplastic radiation therapy: Secondary | ICD-10-CM | POA: Diagnosis not present

## 2015-04-27 DIAGNOSIS — C321 Malignant neoplasm of supraglottis: Secondary | ICD-10-CM

## 2015-04-27 DIAGNOSIS — E43 Unspecified severe protein-calorie malnutrition: Secondary | ICD-10-CM

## 2015-04-27 MED ORDER — OSMOLITE 1.2 CAL PO LIQD: ORAL | Status: DC

## 2015-04-27 NOTE — Progress Notes (Signed)
Weekly Management Note:  Site: Larynx/neck Current Dose:  1750  cGy Projected Dose: 5000  cGy  Narrative: The patient is seen today for routine under treatment assessment. CBCT/MVCT images/port films were reviewed. The chart was reviewed.   He is without new complaints today.  His decubitus ulcer along his left buttock is healed.  He is taking in 6-8 cans of dye supplementation through his PEG tube daily without difficulty.  His weight is up 4 pounds over the past week.  Physical Examination:  Filed Vitals:   04/27/15 1612  BP: 105/82  Pulse: 102  Temp:   Resp:   .  Weight: 117 lb (53.071 kg).  His stoma site is clean.  Oral cavity and oropharynx are unremarkable to inspection.  His PEG tube site is also clean.  Impression: Tolerating radiation therapy well.  Plan: Continue radiation therapy as planned.

## 2015-04-27 NOTE — Telephone Encounter (Signed)
Oncology Nurse Navigator Documentation  Oncology Nurse Navigator Flowsheets 04/27/2015  Navigator Encounter Type Telephone  Time Spent with Patient 30    Patient's sister called to express concern for her brother, feels that he is overwhelmed, is protecting her by not expressing his needs. She indicated he has been requesting $32/day for transportation.  I indicated he has SCAT passes, told me he understands SCAT scheduling.   She asked that check on his understanding of SCAT services, offer to schedule for him.  I will follow-up with patient. She asked about availability of support group at [facility] for caregivers.  I indicated there was none currently scheduled but I would look into when the next sessions were being held and get back to her. She expressed appreciation for my support.  Gayleen Orem, RN, BSN, [place] at [place] [phone]

## 2015-04-27 NOTE — Progress Notes (Signed)
54 year old male diagnosed with cancer of the supraglottis receiving radiation therapy.  He is a patient of Dr. Isidore Moos.  Past medical history includes hypertension, SOB, GERD, seizures.  Medications include Ativan and Carafate.  Labs include glucose 137 on April 23.  Height: 5 feet 10 inches. Weight: 113.5 pounds May 9. Usual body weight: 175 pounds per patient. BMI: 16.29.  Revised Estimated nutrition needs for healing: 1800-2100 cal, 80-100 grams protein, 2.1 L fluid.  Patient reports he is hungry.  He is not able to eat or drink by mouth. He feels constipated. He is tolerating 6 cans of Osmolite 1.2 daily providing 1710 cal and 79 g protein. Patient has a stage II pressure ulcer which is not healing according to patient. Patient has had continued weight loss on current feeding at goal.  Nutrition diagnosis: Unintended weight loss related to diagnosis of cancer of the supraglottis as evidenced by 62 pound weight loss from usual body weight.  Patient meets criteria for severe malnutrition in the context of chronic illness as evidenced by severe muscle and fat depletion and 11% weight loss in 6 weeks.  Intervention:  Will increase Osmolite 1.2-2 cans 4 times a day with 60 cc free water before and after bolus feeding.   In addition patient will flush his feeding tube with an additional 240 cc free water. Tube feeding provides 2280 cal, 106 g protein, 2280 mL free water. Patient will remain nothing by mouth until cleared by M.D. and speech therapy. Patient will contact physician if he remains constipated after increasing free water. Teach back method was used.  Questions were answered.  Tube feeding orders written and advanced homecare was notified.  Monitoring, evaluation, goals: Patient will tolerate an increase and calories and protein to minimize further weight loss and improve wound healing.  Next visit: To be scheduled.  **Disclaimer: This note was dictated with voice  recognition software. Similar sounding words can inadvertently be transcribed and this note may contain transcription errors which may not have been corrected upon publication of note.**

## 2015-04-27 NOTE — Progress Notes (Addendum)
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 Addendum;On assessment of skin breakdown on buttocks Dorothea Ogle states the area is healed and doesn't require a dressing.

## 2015-04-27 NOTE — Progress Notes (Signed)
Entry error

## 2015-04-28 ENCOUNTER — Encounter: Payer: Self-pay | Admitting: *Deleted

## 2015-04-28 ENCOUNTER — Ambulatory Visit
Admission: RE | Admit: 2015-04-28 | Discharge: 2015-04-28 | Disposition: A | Discharge status: Home / Self Care | Payer: Medicaid Other | Source: Ambulatory Visit | Attending: Radiation Oncology | Admitting: Radiation Oncology

## 2015-04-28 ENCOUNTER — Other Ambulatory Visit (HOSPITAL_COMMUNITY): Payer: Self-pay | Admitting: Radiation Oncology

## 2015-04-28 DIAGNOSIS — Z510 Encounter for antineoplastic radiation therapy: Secondary | ICD-10-CM | POA: Diagnosis not present

## 2015-04-28 DIAGNOSIS — R1314 Dysphagia, pharyngoesophageal phase: Secondary | ICD-10-CM

## 2015-04-29 ENCOUNTER — Ambulatory Visit
Admission: RE | Admit: 2015-04-29 | Discharge: 2015-04-29 | Disposition: A | Discharge status: Home / Self Care | Payer: Medicaid Other | Source: Ambulatory Visit | Attending: Radiation Oncology | Admitting: Radiation Oncology

## 2015-04-29 ENCOUNTER — Encounter: Payer: Self-pay | Admitting: *Deleted

## 2015-04-29 ENCOUNTER — Other Ambulatory Visit: Payer: Self-pay | Admitting: Radiation Oncology

## 2015-04-29 DIAGNOSIS — Z510 Encounter for antineoplastic radiation therapy: Secondary | ICD-10-CM | POA: Diagnosis not present

## 2015-04-29 DIAGNOSIS — C321 Malignant neoplasm of supraglottis: Secondary | ICD-10-CM

## 2015-04-29 MED ORDER — LORAZEPAM 0.5 MG PO TABS: ORAL_TABLET | ORAL | Status: DC

## 2015-04-29 NOTE — Progress Notes (Signed)
Called patient to notify that Dr. Valere Dross has wrote a refill order for his Ativan 0.5mg  take 1-2 tablets 30 minutes prior to radiation treatment.  Pt instructed to come to nursing and Enid Derry will have his prescription available and that he needs to have his ID. He stated he'd be around tomorrow.

## 2015-04-30 ENCOUNTER — Ambulatory Visit
Admission: RE | Admit: 2015-04-30 | Discharge: 2015-04-30 | Disposition: A | Discharge status: Home / Self Care | Payer: Medicaid Other | Source: Ambulatory Visit | Attending: Radiation Oncology | Admitting: Radiation Oncology

## 2015-04-30 DIAGNOSIS — Z510 Encounter for antineoplastic radiation therapy: Secondary | ICD-10-CM | POA: Diagnosis not present

## 2015-04-30 NOTE — Progress Notes (Signed)
Oncology Nurse Navigator Documentation  Oncology Nurse Navigator Flowsheets 04/27/2015 04/28/2015  Navigator Encounter Type Telephone Treatment  Patient Visit Type - Radonc  Treatment Phase - Treatment  Barriers/Navigation Needs - No barriers at this time  Interventions - None required  Time Spent with Patient 30 15    To provide support and encouragement, care continuity and to assess for needs, met with patient prior to Tomo tmt. He stated his friends/neighbors are providing transportation support, he is not using SCAT. I offered to provide support with SCAT scheduling when it is needed. He stated is managing tmts without difficulty. He did not express any needs or concerns at this time, I encouraged him to contact me if that changes, he verbalized understanding.  Gayleen Orem, RN, BSN, [place] at [place] [phone]

## 2015-05-01 ENCOUNTER — Encounter: Payer: Self-pay | Admitting: *Deleted

## 2015-05-01 ENCOUNTER — Ambulatory Visit
Admission: RE | Admit: 2015-05-01 | Discharge: 2015-05-01 | Disposition: A | Discharge status: Home / Self Care | Payer: Medicaid Other | Source: Ambulatory Visit | Attending: Radiation Oncology | Admitting: Radiation Oncology

## 2015-05-01 DIAGNOSIS — Z510 Encounter for antineoplastic radiation therapy: Secondary | ICD-10-CM | POA: Diagnosis not present

## 2015-05-01 NOTE — Progress Notes (Signed)
Oncology Nurse Navigator Documentation  Oncology Nurse Navigator Flowsheets 04/27/2015 04/28/2015 05/01/2015  Navigator Encounter Type Telephone Treatment Other - Family Visit  Patient Visit Type - Radonc Patient sister came to [facility], I met with her in [facility] lobby.  She was in [place] checking on status of his SS application.  She expressed ongoing concern for her brother's living arrangement which is questionable d/t his finances and her ability to help financially.  She expressed concern for her brother's ability to care for himself as tmts proceed and SEs develop.  She stated her brother continues to refuse consideration of SNF placement.  I assured her he is obtaining thorough assessment during his weekly UT, he can be seen by MD/RN any day that is necessary.  I will continue to follow patient closely.  Treatment Phase - Treatment -  Barriers/Navigation Needs - No barriers at this time - Future housing.  Interventions - None required -  Navigator to  Contact LCSW.  Time Spent with Patient 30 15 30

## 2015-05-04 ENCOUNTER — Ambulatory Visit
Admission: RE | Admit: 2015-05-04 | Discharge: 2015-05-04 | Disposition: A | Discharge status: Home / Self Care | Payer: Medicaid Other | Source: Ambulatory Visit | Attending: Radiation Oncology | Admitting: Radiation Oncology

## 2015-05-04 ENCOUNTER — Ambulatory Visit: Payer: Self-pay

## 2015-05-04 ENCOUNTER — Encounter: Payer: Self-pay | Admitting: *Deleted

## 2015-05-04 ENCOUNTER — Ambulatory Visit
Admission: RE | Admit: 2015-05-04 | Discharge: 2015-05-04 | Disposition: A | Discharge status: Home / Self Care | Payer: Self-pay | Source: Ambulatory Visit | Attending: Radiation Oncology | Admitting: Radiation Oncology

## 2015-05-04 ENCOUNTER — Encounter: Payer: Self-pay | Admitting: Radiation Oncology

## 2015-05-04 VITALS — BP 114/81 | HR 90 | Temp 98.4°F | Resp 20 | Ht 70.0 in | Wt 121.9 lb

## 2015-05-04 DIAGNOSIS — C321 Malignant neoplasm of supraglottis: Secondary | ICD-10-CM

## 2015-05-04 DIAGNOSIS — Z510 Encounter for antineoplastic radiation therapy: Secondary | ICD-10-CM | POA: Diagnosis not present

## 2015-05-04 MED ORDER — HYDROCODONE-ACETAMINOPHEN 7.5-325 MG/15ML PO SOLN: ORAL | Status: DC

## 2015-05-04 MED ORDER — POLYETHYLENE GLYCOL 3350 17 G PO PACK: 17.0000 g | PACK | Freq: Every day | ORAL | Status: DC

## 2015-05-04 NOTE — Progress Notes (Signed)
   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  CBCT/MVCT images/Port film x-rays were reviewed.  The chart was checked. He denies pain but has a sore throat.  He said he is not taking anything for pain.  He also has not had to take the ativan before treatment the last few days.  He reports a dry mouth.  He has been drinking a little bit of water.  He reports not being able to suction through his trach.  He has to suction orally.  He is putting 8 cans of Osmolite through his peg tube.  His weight is up 4 lbs from last week.  He has hyperpigmentation on his neck as well as a raised rash that he reports is itchy.  He is using biafine.  He has a nicotine patch on and has been smoking 2-3 cigarettes per day.  Physical Findings:  Wt Readings from Last 3 Encounters:  05/04/15 121 lb 14.4 oz (55.293 kg)  04/27/15 117 lb (53.071 kg)  04/05/15 118 lb (53.524 kg)    height is 5\' 10"  (1.778 m) and weight is 121 lb 14.4 oz (55.293 kg). His oral temperature is 98.4 F (36.9 C). His blood pressure is 114/81 and his pulse is 90. His respiration is 20 and oxygen saturation is 100%.  bilateral bulky neck masses have decreased in size. Less wet vocal quality. + Trach. Mild skin dermatitis on neck   CBC    Component Value Date/Time   WBC 8.5 04/03/2015 0435   RBC 3.01* 04/03/2015 0435   HGB 9.8* 04/03/2015 0435   HCT 29.5* 04/03/2015 0435   PLT 294 04/03/2015 0435   MCV 98.0 04/03/2015 0435   MCH 32.6 04/03/2015 0435   MCHC 33.2 04/03/2015 0435   RDW 14.4 04/03/2015 0435   LYMPHSABS 1.3 03/27/2015 0256   MONOABS 0.9 03/27/2015 0256   EOSABS 0.0 03/27/2015 0256   BASOSABS 0.0 03/27/2015 0256     CMP       Component Value Date/Time   NA 138 04/04/2015 0408   K 3.8 04/04/2015 0408   CL 97 04/04/2015 0408   CO2 34* 04/04/2015 0408   GLUCOSE 137* 04/04/2015 0408   BUN 6 04/04/2015 0408   CREATININE 0.46* 04/04/2015 0408   CALCIUM 9.2 04/04/2015 0408   PROT 6.4 03/27/2015 0256   ALBUMIN 2.4* 03/27/2015 0256   AST 17 03/27/2015 0256   ALT 9 03/27/2015 0256   ALKPHOS 59 03/27/2015 0256   BILITOT 1.2 03/27/2015 0256   GFRNONAA >90 04/04/2015 0408   GFRAA >90 04/04/2015 0408     Impression:  The patient is tolerating radiotherapy. Good response thus far, gaining weight  Plan:  Continue radiotherapy as planned. See orders above by diagnosis for throat pain/supportive care.  -----------------------------------  [name], MD

## 2015-05-04 NOTE — Progress Notes (Addendum)
Kyle Alexander has completed 12 fractions to his larynx.  He denies pain but has a sore throat.  He said he is not taking anything for pain.  He also has not had to take the ativan before treatment the last few days.  He reports a dry mouth.  He has been drinking a little bit of water.  He reports not being able to suction through his trach.  He has to suction orally.  He is putting 8 cans of Osmolite through his peg tube.  His weight is up 4 lbs from last week.  He has hyperpigmentation on his neck as well as a raised rash that he reports is itchy.  He is using biafine.  He has a nicotine patch on and has been smoking 2-3 cigarettes per day.  BP 114/81 mmHg  Pulse 90  Temp(Src) 98.4 F (36.9 C) (Oral)  Resp 20  Ht 5\' 10"  (1.778 m)  Wt 121 lb 14.4 oz (55.293 kg)  BMI 17.49 kg/m2  SpO2 100%   Wt Readings from Last 3 Encounters:  05/04/15 121 lb 14.4 oz (55.293 kg)  04/27/15 117 lb (53.071 kg)  04/05/15 118 lb (53.524 kg)

## 2015-05-05 ENCOUNTER — Ambulatory Visit
Admission: RE | Admit: 2015-05-05 | Discharge: 2015-05-05 | Disposition: A | Discharge status: Home / Self Care | Payer: Medicaid Other | Source: Ambulatory Visit | Attending: Radiation Oncology | Admitting: Radiation Oncology

## 2015-05-05 DIAGNOSIS — Z510 Encounter for antineoplastic radiation therapy: Secondary | ICD-10-CM | POA: Diagnosis not present

## 2015-05-06 ENCOUNTER — Ambulatory Visit
Admission: RE | Admit: 2015-05-06 | Discharge: 2015-05-06 | Disposition: A | Discharge status: Home / Self Care | Payer: Medicaid Other | Source: Ambulatory Visit | Attending: Radiation Oncology | Admitting: Radiation Oncology

## 2015-05-06 DIAGNOSIS — Z510 Encounter for antineoplastic radiation therapy: Secondary | ICD-10-CM | POA: Diagnosis not present

## 2015-05-06 NOTE — Progress Notes (Signed)
Oncology Nurse Navigator Documentation  Oncology Nurse Navigator Flowsheets 04/27/2015 04/28/2015 05/01/2015 05/04/2015  Navigator Encounter Type Telephone Treatment Other Clinic/MDC  To provide support and encouragement, care continuity and to assess for needs, met with patient during Weekly UT with Dr. Isidore Moos.  He reported:  Housing situation is stable at this time.  His friends/neighbors are providing transportation.  Home Health is making twice weekly visits to address trach/PEG, usually on Tuesdays and later in week on Thursday or Friday.  He is managing trach and PEG without difficulty.  He has explained to his sister [name] that he is receptive to her assistance when he is further progressed with tmt and in need of care. He understands he can contact me.  Patient Visit Type - Radonc - Radonc  Treatment Phase - Treatment - -  Barriers/Navigation Needs - No barriers at this time - No barriers at this time  Interventions - None required - -  Time Spent with Patient _0 Gayleen Orem, RN, BSN, [place] at [facility] [phone]

## 2015-05-07 ENCOUNTER — Ambulatory Visit: Admission: RE | Admit: 2015-05-07 | Payer: Medicaid Other | Source: Ambulatory Visit

## 2015-05-08 ENCOUNTER — Encounter: Payer: Self-pay | Admitting: *Deleted

## 2015-05-08 ENCOUNTER — Ambulatory Visit (HOSPITAL_COMMUNITY)
Admission: RE | Admit: 2015-05-08 | Discharge: 2015-05-08 | Disposition: A | Discharge status: Home / Self Care | Payer: Medicaid Other | Source: Ambulatory Visit | Attending: Radiation Oncology | Admitting: Radiation Oncology

## 2015-05-08 ENCOUNTER — Ambulatory Visit
Admission: RE | Admit: 2015-05-08 | Discharge: 2015-05-08 | Disposition: A | Discharge status: Home / Self Care | Payer: Medicaid Other | Source: Ambulatory Visit | Attending: Radiation Oncology | Admitting: Radiation Oncology

## 2015-05-08 ENCOUNTER — Other Ambulatory Visit: Payer: Self-pay | Admitting: Radiation Oncology

## 2015-05-08 DIAGNOSIS — C321 Malignant neoplasm of supraglottis: Secondary | ICD-10-CM

## 2015-05-08 DIAGNOSIS — Z510 Encounter for antineoplastic radiation therapy: Secondary | ICD-10-CM | POA: Diagnosis not present

## 2015-05-08 DIAGNOSIS — R1311 Dysphagia, oral phase: Secondary | ICD-10-CM | POA: Insufficient documentation

## 2015-05-08 DIAGNOSIS — R1314 Dysphagia, pharyngoesophageal phase: Secondary | ICD-10-CM

## 2015-05-08 NOTE — Progress Notes (Signed)
MBSS complete. Full report located under chart review in imaging section.  

## 2015-05-08 NOTE — Progress Notes (Signed)
Oncology Nurse Navigator Documentation  Oncology Nurse Navigator Flowsheets 04/27/2015 04/28/2015 05/01/2015 05/04/2015 05/08/2015  Navigator Encounter Type Telephone Treatment Other Clinic/MDC Other After RN follow-up s/p his RT today, he expressed confusion where at [facility] he was to go for his MBS.  I provided escort per his request.  Patient Visit Type - Radonc - Radonc -  Treatment Phase - Treatment - - -  Barriers/Navigation Needs - No barriers at this time - No barriers at this time -  Interventions - None required - - Other  Time Spent with Patient 30 15 30 30  15

## 2015-05-08 NOTE — Progress Notes (Signed)
Speech Pathology Modified Barium Swallow    05/08/15 1400  SLP Visit Information  SLP Received On 05/08/15  Subjective  Subjective Pt alert and cooperative.  Requested suction to remove pharyngeal secretions prior to this study (pt suctioned his mouth, then immediately moved the suction to the trach to clear secretions.  When questioned, pt stated, "I do that all the time at home.")  Patient/Family Stated Goal "I want to eat if I can."  Pain Assessment  Pain Assessment No/denies pain  General Information  Other Pertinent Information 54 yo male referred for OPMBS for possible initiation of po's following  diagnosis of supraglottic squamous cell carcinoma.  Pt had trach and PEG placed on 03/31/15 and teeth were also extracted.  Pt has remained NPO with PEG TF's.  Pt reports he "only" smokes 1-2 cigarettes per day.  Type of Study (MBS)  Reason for Referral Objectively evaluate swallowing function  Previous Swallow Assessment none  Diet Prior to this Study NPO;PEG tube  Temperature Spikes Noted No  Respiratory Status Room air  History of Recent Intubation No  Behavior/Cognition Alert;Cooperative;Pleasant mood  Oral Cavity - Dentition Edentulous  Oral Motor / Sensory Function WFL  Self-Feeding Abilities Able to feed self  Patient Positioning Upright in chair/Tumbleform  Baseline Vocal Quality Wet ( PMSV in place)  Volitional Cough Strong;Congested  Volitional Swallow Able to elicit  Anatomy Other (Comment) (Appearance of mass on the posterior pharyngeal wall, with ?e)  Pharyngeal Secretions Not observed secondary MBS  Oral Preparation/Oral Phase  Oral Phase Impaired  Oral - Solids  Oral - Mechanical Soft (Not tested, due to no dentition)  Pharyngeal Phase  Pharyngeal Phase Impaired  Pharyngeal - Honey  Pharyngeal- Honey Teaspoon Reduced pharyngeal peristalsis;Reduced epiglottic inversion;Reduced anterior laryngeal mobility;Reduced laryngeal elevation;Reduced airway/laryngeal  closure;Reduced tongue base retraction;Penetration/Apiration after swallow;Moderate aspiration  Pharyngeal - Nectar  Pharyngeal- Nectar Teaspoon Reduced pharyngeal peristalsis;Reduced epiglottic inversion;Reduced anterior laryngeal mobility;Reduced laryngeal elevation;Reduced airway/laryngeal closure;Reduced tongue base retraction;Penetration/Apiration after swallow;Moderate aspiration  Cervical Esophageal Phase  Cervical Esophageal Phase Impaired  Cervical Esophageal Phase - Pudding  Pudding Teaspoon Reduced cricopharyngeal relaxation;Esophageal backflow into the pharynx;Esophageal backflow into the larynx  Cervical Esophageal Phase - Honey  Honey Teaspoon Reduced cricopharyngeal relaxation;Esophageal backflow into the larynx  Cervical Esophageal Phase - Nectar  Nectar Teaspoon Reduced cricopharyngeal relaxation;Esophageal backflow into the larynx  Clinical Impression  Therapy Diagnosis Mild oral phase dysphagia;Severe pharyngeal phase dysphagia;Moderate cervical esophageal phase dysphagia  Clinical Impression Pt exhibits a mild oral phase (due to lack of dentition), and moderate to severe pharyngeal and cervical esophageal phase dysphasia, characterized by recued base of tongue contraction to the pharyngeal wall, reduced laryngeal elevation and laryngeal excursion, resulting in residue at the pyriforms/ near UES, which spills into the airway after the swallow.  Pt does sense the aspiration and coughs, but does not consistently clear the residue on another swallow following the cough.  Pt is at risk for aspiration, and is continuing radiation treatments (another 7-8 to go) with possible further damage to the surrounding soft tissues.  It is recommended that pt continue NPO with PEG to all nutrition and meds, and have [place] SLP work with pt on Supra-glottic swallow technique and pharyngeal excercises to minimize stiffnessof the swallow mechanism over the radiation course.  The [employer] Protocol was  discussed (cleaning mouth with anti-bacterial mouthwash prior to having small sips of water, 3-4 times per day).  Pt should return for a repeat OPMBS after completion of radiation treatment.  The ultimate goal is  for pt to return to a modifed po diet with compensatory strategies.  Pt did appear to swallow better (had less residue) with head turned to right combined with chin tuck.  This head position in combiation with the supraglottic swallow technique may allow for safe po intake.  OP or HH SLP would be needed to train pt for consistent proper use.  Swallow Evaluation Recommendations  SLP Diet Recommendations NPO;Ice chips PRN after oral care  Liquid Administration via Spoon  Medication Administration Via alternative means  Postural Changes (head turn to right with chin tuck)  Treatment Plan  Oral Care Recommendations Oral care before and after PO (before sips of water)  Other Recommendations Have oral suction available  Treatment Recommendations F/u [facility] SLP;F/u OP SLP  Prognosis  Prognosis for Safe Diet Advancement Guarded  Barriers to Reach Goals Severity of deficits;Medication (radiation)  Individuals Consulted  Consulted and Agree with Results and Recommendations Patient  SLP Time Calculation  SLP Start Time (ACUTE ONLY) 1235  SLP Stop Time (ACUTE ONLY) 1315  SLP Time Calculation (min) (ACUTE ONLY) 40 min  SLP G-Codes **NOT FOR INPATIENT CLASS**  Functional Assessment Tool Used Clinical judgement  Functional Limitations Swallowing  Swallow Current Status (A5790) CL  Swallow Goal Status (X8333) CL  Swallow Discharge Status (O3291) CL  SLP Evaluations  $ SLP Speech Visit 1 Procedure  SLP Evaluations  $Swallowing Treatment 1 Procedure  $MBS Swallow Outpatient 1 Procedure

## 2015-05-12 ENCOUNTER — Ambulatory Visit
Admission: RE | Admit: 2015-05-12 | Discharge: 2015-05-12 | Disposition: A | Discharge status: Home / Self Care | Payer: Medicaid Other | Source: Ambulatory Visit | Attending: Radiation Oncology | Admitting: Radiation Oncology

## 2015-05-12 DIAGNOSIS — Z510 Encounter for antineoplastic radiation therapy: Secondary | ICD-10-CM | POA: Diagnosis not present

## 2015-05-13 ENCOUNTER — Ambulatory Visit (HOSPITAL_COMMUNITY)
Admission: RE | Admit: 2015-05-13 | Discharge: 2015-05-13 | Disposition: A | Discharge status: Home / Self Care | Payer: Medicaid Other | Source: Ambulatory Visit | Attending: Acute Care | Admitting: Acute Care

## 2015-05-13 ENCOUNTER — Ambulatory Visit: Payer: Medicaid Other

## 2015-05-13 ENCOUNTER — Ambulatory Visit
Admission: RE | Admit: 2015-05-13 | Discharge: 2015-05-13 | Disposition: A | Discharge status: Home / Self Care | Payer: Medicaid Other | Source: Ambulatory Visit | Attending: Radiation Oncology | Admitting: Radiation Oncology

## 2015-05-13 ENCOUNTER — Ambulatory Visit
Admission: RE | Admit: 2015-05-13 | Discharge: 2015-05-13 | Disposition: A | Discharge status: Home / Self Care | Payer: Self-pay | Source: Ambulatory Visit | Attending: Radiation Oncology | Admitting: Radiation Oncology

## 2015-05-13 VITALS — BP 109/75 | HR 81 | Temp 98.3°F | Resp 12 | Wt 128.0 lb

## 2015-05-13 DIAGNOSIS — R229 Localized swelling, mass and lump, unspecified: Secondary | ICD-10-CM

## 2015-05-13 DIAGNOSIS — Z430 Encounter for attention to tracheostomy: Secondary | ICD-10-CM | POA: Insufficient documentation

## 2015-05-13 DIAGNOSIS — C321 Malignant neoplasm of supraglottis: Secondary | ICD-10-CM

## 2015-05-13 DIAGNOSIS — C760 Malignant neoplasm of head, face and neck: Secondary | ICD-10-CM | POA: Diagnosis not present

## 2015-05-13 DIAGNOSIS — Z510 Encounter for antineoplastic radiation therapy: Secondary | ICD-10-CM | POA: Diagnosis not present

## 2015-05-13 DIAGNOSIS — Z930 Tracheostomy status: Secondary | ICD-10-CM | POA: Diagnosis not present

## 2015-05-13 NOTE — Progress Notes (Signed)
Tracheostomy Procedure Note  Kyle Alexander [id number] 1961/07/12  Pre Procedure Tracheostomy Information  Trach Brand: Shiley Size: 4 Style: Uncuffed Secured by: Velcro  VS- BP-113/86, RR-18, HR-88, SpO2-100% on RA with PMV   Procedure: trach evaluation & cleaning    Vital signs:blood pressure 115/73, pulse 89, respirations 18 and pulse oximetry 100% on RA with PMV Patients current condition: stable Wound care done: saline soaked and 4 x 4 gauze Pt neck is starting to become red due to radiation treatments.   Discussed options with pt today. Pt has 2 weeks left of radiation treatments. Will have swallow eval scheduled for 4 weeks from now. Will see back in trach clinic in 6 weeks to consider decannulation.  Prescription needs: None

## 2015-05-13 NOTE — Progress Notes (Signed)
He is currently in no pain.  Pt complains of fatigue.  Pt denies dysphagia. However he reports he didn't pass his swallow study. Pt takes food via feeding tube-formula-osmolite, 8 cans per day, 38ml H20 flush plus 231ml H20 throughout day. Oral exam reveals moist mouth with clear and thin sputum. He reports thick ropey sputum as well. Skin exam reveals Hyperpigmentation and Pruritus.  Trach site cleansed with NS and applied 4x4 hydrophilic dressing, due to tenderness at trach site.  Pt reports the hydrophilic dressing feels "wonderful." Pt given extra supplies and instructed to let nursing know how he likes them over the next few days.  Pt reports Diarrhea  1 this morning.  Wt Readings from Last 3 Encounters:  05/13/15 128 lb (58.06 kg)  05/04/15 121 lb 14.4 oz (55.293 kg)  04/27/15 117 lb (53.071 kg)   BP 109/75 mmHg  Pulse 81  Temp(Src) 98.3 F (36.8 C) (Oral)  Resp 12  Wt 128 lb (58.06 kg)  SpO2 100% Orthostatic Standing Vital Signs: BP:109/75 P: 81 Pox:100

## 2015-05-13 NOTE — Progress Notes (Signed)
   Weekly Management Note:  Outpatient    ICD-9-CM ICD-10-CM   1. 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  CBCT/MVCT images/Port film x-rays were reviewed. The chart was checked. Denies pain. Failed swallowing study.  Thick sputum. Gaining weight.  Physical Findings:  Wt Readings from Last 3 Encounters:  05/13/15 128 lb (58.06 kg)  05/04/15 121 lb 14.4 oz (55.293 kg)  04/27/15 117 lb (53.071 kg)    weight is 128 lb (58.06 kg). His oral temperature is 98.3 F (36.8 C). His blood pressure is 109/75 and his pulse is 81. His respiration is 12 and oxygen saturation is 100%.  bilateral bulky neck masses still palpable.   + Trach. Mild skin dermatitis on neck  Benign appearing soft masses in lower cheeks/lateral chin  CBC    Component Value Date/Time   WBC 8.5 04/03/2015 0435   RBC 3.01* 04/03/2015 0435   HGB 9.8* 04/03/2015 0435   HCT 29.5* 04/03/2015 0435   PLT 294 04/03/2015 0435   MCV 98.0 04/03/2015 0435   MCH 32.6 04/03/2015 0435   MCHC 33.2 04/03/2015 0435   RDW 14.4 04/03/2015 0435   LYMPHSABS 1.3 03/27/2015 0256   MONOABS 0.9 03/27/2015 0256   EOSABS 0.0 03/27/2015 0256   BASOSABS 0.0 03/27/2015 0256     CMP     Component Value Date/Time   NA 138 04/04/2015 0408   K 3.8 04/04/2015 0408   CL 97 04/04/2015 0408   CO2 34* 04/04/2015 0408   GLUCOSE 137* 04/04/2015 0408   BUN 6 04/04/2015 0408   CREATININE 0.46* 04/04/2015 0408   CALCIUM 9.2 04/04/2015 0408   PROT 6.4 03/27/2015 0256   ALBUMIN 2.4* 03/27/2015 0256   AST 17 03/27/2015 0256   ALT 9 03/27/2015 0256   ALKPHOS 59 03/27/2015 0256   BILITOT 1.2 03/27/2015 0256   GFRNONAA >90 04/04/2015 0408   GFRAA >90 04/04/2015 0408     Impression:  The patient is tolerating radiotherapy. Good response with shrinkage of nodes, gaining weight  Plan:  Continue radiotherapy as  planned.  I will make sure SLP scheduled followup with him. Refer to derm (c/o growing masses over lower cheeks x 2 years, subcutaneous) and these bother him.  Refer back to see Dr [name] in ~3 weeks, to alternate follow-ups with me.  This document serves as a record of services personally performed by [name], MD. It was created on her behalf by Lenn Cal, a trained medical scribe. The creation of this record is based on the scribe's personal observations and the provider's statements to them. This document has been checked and approved by the attending provider.   -----------------------------------  [name], MD

## 2015-05-13 NOTE — Progress Notes (Signed)
CC: trach clinic   Brief History  54 year old man with history of hypertension, tobacco, alcohol and Squamous cell carcinoma of the nasopharynx, hypopharynx and larynx- status post tracheostomy and multiple dental extractions on 4/14 G tube placement on 4/19. Presents to trach clinic in effort to establish care. To date he has yet to initiate XRT successfully either d/t secretions OR anxiety/ mix of both.   Vitals: SBP 113/97, HR 80, SBP 99% on RA.  General appearance: chronically ill appearing AAM, currently no acute distress  Mouth: membranes and no mucosal ulcerations; normal hard and soft palate Neck: Trachea midline, # 4 trach, cuffless trach stoma unremarkable. Large golf ball sized visible mass L>R above stoma/trach  Lungs: scattered rhonchi that cl w/ cough. with normal respiratory effort and no intercostal retractions. Weak phonation  CV: RRR, no MRGs  Abdomen: Soft, non-tender; no masses or HSM Extremities: No peripheral edema or extremity lymphadenopathy Skin: Normal temperature, turgor and texture; no rash, ulcers or subcutaneous nodules Psych: Appropriate affect, alert and oriented to person, place and time  Date  Last Seen by:  Lurline Idol size Trach change  pmv  Capping  Stoma assessment  Diet  Goals   5/4 babcock 4 cuffless no yes  Clean and unremarkable NPO                        Impressio/plan  Tracheostomy status in setting of head and neck cancer.  2 more weeks of XRT for throat cancer as per patient. Failed swallow evaluation.   Plan - Maintain with a 4 cuffless trach for now. - Repeat swallow evaluation in 4 wks. - RTC in 6 wks. - If passed swallow evaluation will examine insertion site, if tissue is viable and stable will consider decannulation, if not then will not.  Assuming cancer is getting smaller in size. - Do not change trach today.  Rush Farmer, M.D. [facility] Pulmonary/Critical Care Medicine. Pager:  [phone]. After hours pager: [phone].

## 2015-05-14 ENCOUNTER — Ambulatory Visit: Payer: Medicaid Other | Admitting: Radiation Oncology

## 2015-05-14 ENCOUNTER — Ambulatory Visit: Payer: Medicaid Other

## 2015-05-14 ENCOUNTER — Ambulatory Visit
Admission: RE | Admit: 2015-05-14 | Discharge: 2015-05-14 | Disposition: A | Discharge status: Home / Self Care | Payer: Medicaid Other | Source: Ambulatory Visit | Attending: Radiation Oncology | Admitting: Radiation Oncology

## 2015-05-14 DIAGNOSIS — Z510 Encounter for antineoplastic radiation therapy: Secondary | ICD-10-CM | POA: Diagnosis not present

## 2015-05-15 ENCOUNTER — Ambulatory Visit
Admission: RE | Admit: 2015-05-15 | Discharge: 2015-05-15 | Disposition: A | Discharge status: Home / Self Care | Payer: Medicaid Other | Source: Ambulatory Visit | Attending: Radiation Oncology | Admitting: Radiation Oncology

## 2015-05-15 ENCOUNTER — Telehealth: Payer: Self-pay | Admitting: *Deleted

## 2015-05-15 ENCOUNTER — Ambulatory Visit: Payer: Medicaid Other

## 2015-05-15 DIAGNOSIS — Z510 Encounter for antineoplastic radiation therapy: Secondary | ICD-10-CM | POA: Diagnosis not present

## 2015-05-15 NOTE — Telephone Encounter (Signed)
Oncology Nurse Navigator Documentation  Oncology Nurse Navigator Flowsheets 05/15/2015  Navigator Encounter Type Telephone  Per note from Dr. Isidore Moos, called [facility] to arrange follow-up appt for patient with Dr. [name] in 2-3 weeks.  Spoke with Anderson Malta, she verbalized understanding.  Patient Visit Type -  Treatment Phase -  Barriers/Navigation Needs -  Interventions -  Time Spent with Patient [place], RN, BSN, [place] at [place] [phone]

## 2015-05-18 ENCOUNTER — Ambulatory Visit: Payer: Medicaid Other

## 2015-05-18 ENCOUNTER — Encounter: Payer: Self-pay | Admitting: Radiation Oncology

## 2015-05-18 ENCOUNTER — Encounter: Payer: Self-pay | Admitting: *Deleted

## 2015-05-18 ENCOUNTER — Ambulatory Visit
Admission: RE | Admit: 2015-05-18 | Discharge: 2015-05-18 | Disposition: A | Discharge status: Home / Self Care | Payer: Medicaid Other | Source: Ambulatory Visit | Attending: Radiation Oncology | Admitting: Radiation Oncology

## 2015-05-18 VITALS — BP 105/79 | HR 102 | Temp 98.2°F | Resp 20 | Ht 70.0 in | Wt 130.2 lb

## 2015-05-18 DIAGNOSIS — C140 Malignant neoplasm of pharynx, unspecified: Secondary | ICD-10-CM | POA: Diagnosis not present

## 2015-05-18 DIAGNOSIS — L598 Other specified disorders of the skin and subcutaneous tissue related to radiation: Secondary | ICD-10-CM | POA: Diagnosis not present

## 2015-05-18 DIAGNOSIS — C321 Malignant neoplasm of supraglottis: Secondary | ICD-10-CM

## 2015-05-18 DIAGNOSIS — Z931 Gastrostomy status: Secondary | ICD-10-CM | POA: Diagnosis not present

## 2015-05-18 DIAGNOSIS — Z510 Encounter for antineoplastic radiation therapy: Secondary | ICD-10-CM | POA: Diagnosis not present

## 2015-05-18 MED ORDER — BIAFINE EX EMUL
Freq: Once | CUTANEOUS | Status: AC
  Administered 2015-05-18: 14:00:00 via TOPICAL

## 2015-05-18 NOTE — Progress Notes (Addendum)
Kyle Alexander has completed treatment with 20 fractions to his larynx/bilateral neck.  He denies pain.  He does report a sore throat with suctioning.  He is suctioning and coughing up sputum "like bubble gum."  He says it is green and sometimes has blood in it.  He has not been eating or drinking by mouth.  His feeding tube was cleaned and flushed. Some pressure was felt with flushing.  Encouraged him to clean it daily.  He is taking in 8 cans of Osmolite per day.  He denies having any mouth sores.  He reports a dry mouth.  His tracheostomy site is intact.  He reports it was cleaned this morning. He has small areas of peeling on both sides of his neck.  He has hyperpigmentation.  He is using biafine and is requesting a refill.  Another tube has been given.  BP 105/79 mmHg  Pulse 102  Temp(Src) 98.2 F (36.8 C) (Oral)  Resp 20  SpO2 100%   Wt Readings from Last 3 Encounters:  05/13/15 128 lb (58.06 kg)  05/04/15 121 lb 14.4 oz (55.293 kg)  04/27/15 117 lb (53.071 kg)

## 2015-05-18 NOTE — Progress Notes (Addendum)
   Weekly Management Note:  Outpatient    ICD-9-CM ICD-10-CM   1. 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  CBCT/MVCT images/Port film x-rays were reviewed. The chart was checked. Denies pain. Symptoms are stable.   All nutrition per PEG, gaining weight. Sees SLP next week. ENT f/u  later this month.  Physical Findings:  Wt Readings from Last 3 Encounters:  05/18/15 130 lb 3.2 oz (59.058 kg)  05/13/15 128 lb (58.06 kg)  05/04/15 121 lb 14.4 oz (55.293 kg)    height is 5\' 10"  (1.778 m) and weight is 130 lb 3.2 oz (59.058 kg). His oral temperature is 98.2 F (36.8 C). His blood pressure is 105/79 and his pulse is 102. His respiration is 20 and oxygen saturation is 100%.  bilateral bulky neck masses still visible.   +Trach. Mild skin dermatitis on neck     CBC    Component Value Date/Time   WBC 8.5 04/03/2015 0435   RBC 3.01* 04/03/2015 0435   HGB 9.8* 04/03/2015 0435   HCT 29.5* 04/03/2015 0435   PLT 294 04/03/2015 0435   MCV 98.0 04/03/2015 0435   MCH 32.6 04/03/2015 0435   MCHC 33.2 04/03/2015 0435   RDW 14.4 04/03/2015 0435   LYMPHSABS 1.3 03/27/2015 0256   MONOABS 0.9 03/27/2015 0256   EOSABS 0.0 03/27/2015 0256   BASOSABS 0.0 03/27/2015 0256     CMP     Component Value Date/Time   NA 138 04/04/2015 0408   K 3.8 04/04/2015 0408   CL 97 04/04/2015 0408   CO2 34* 04/04/2015 0408   GLUCOSE 137* 04/04/2015 0408   BUN 6 04/04/2015 0408   CREATININE 0.46* 04/04/2015 0408   CALCIUM 9.2 04/04/2015 0408   PROT 6.4 03/27/2015 0256   ALBUMIN 2.4* 03/27/2015 0256   AST 17 03/27/2015 0256   ALT 9 03/27/2015 0256   ALKPHOS 59 03/27/2015 0256   BILITOT 1.2 03/27/2015 0256   GFRNONAA >90 04/04/2015 0408   GFRAA >90 04/04/2015 0408     Impression:  The patient has tolerated radiotherapy. Good response with shrinkage of  nodes, gaining weight  Plan:    F/u in 1-2 weeks with Dr Valere Dross to make sure nutrition and hydration remain good, and medical stability continues. I will see pt in July. -----------------------------------  [name], MD

## 2015-05-19 ENCOUNTER — Ambulatory Visit: Payer: Medicaid Other

## 2015-05-20 ENCOUNTER — Ambulatory Visit: Payer: Medicaid Other

## 2015-05-20 NOTE — Progress Notes (Signed)
Oncology Nurse Navigator Documentation  Oncology Nurse Navigator Flowsheets 05/18/2015  Navigator Encounter Type Other  Patient Visit Type Radonc  Treatment Phase Final Radiation Tx  Met with pt during final RT to offer support and to celebrate end of radiation treatment and during subsequent weekly UT with Dr. Isidore Moos. 1. I explained that my role as navigator will continue for several more months and that I will be calling and/or joining him during follow-up visits.   2. I assisted RN Elmo Putt with PEG evaluation and dsg change. 3. I encouraged him to call me with needs/concerns.   4. He verbalized understanding of information provided.   Barriers/Navigation Needs -  Interventions -  Time Spent with Patient 45

## 2015-05-21 ENCOUNTER — Ambulatory Visit: Payer: Medicaid Other

## 2015-05-22 ENCOUNTER — Ambulatory Visit: Payer: Medicaid Other

## 2015-05-24 NOTE — Progress Notes (Signed)
  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   His weight increased during treatment and his nodal masses began to shrink.  He was not a candidate for chemotherapy.  Plan: The patient has completed radiation treatment. The patient will return to radiation oncology clinic for routine followup in one half month. I advised them to call or return sooner if they have any questions or concerns related to their recovery or treatment.  -----------------------------------  [name], MD

## 2015-05-25 ENCOUNTER — Ambulatory Visit: Payer: Self-pay

## 2015-05-25 ENCOUNTER — Ambulatory Visit: Payer: MEDICAID

## 2015-05-25 ENCOUNTER — Telehealth: Payer: Self-pay

## 2015-05-25 ENCOUNTER — Ambulatory Visit: Payer: Medicaid Other

## 2015-05-25 NOTE — Telephone Encounter (Signed)
Pt called to cancel speech/swalllow eval today due to vomiting, according to admin support at neurorehab. He was rescheduled to 06-01-15.

## 2015-05-26 ENCOUNTER — Ambulatory Visit: Payer: Medicaid Other

## 2015-05-26 ENCOUNTER — Telehealth: Payer: Self-pay | Admitting: *Deleted

## 2015-05-27 ENCOUNTER — Ambulatory Visit: Payer: Medicaid Other

## 2015-05-28 ENCOUNTER — Encounter: Payer: Self-pay | Admitting: Radiation Oncology

## 2015-05-28 ENCOUNTER — Telehealth: Payer: Self-pay | Admitting: *Deleted

## 2015-05-28 ENCOUNTER — Ambulatory Visit: Payer: Medicaid Other

## 2015-05-28 NOTE — Telephone Encounter (Signed)
Oncology Nurse Navigator Documentation  Oncology Nurse Navigator Flowsheets 05/26/2015  Navigator Encounter Type Telephone  To provide care continuity and to assess for needs, called Jeneen Rinks to check on his well-being since completing RT last week. He reported: "I'm fine." Managing trach and PEG without difficulty. [facility] RN Louie Casa noted stoma appears irritated, he is contacting PCP to evaluate.  I recommended RN contact Dr. [name] at [facility] as he placed trach.  Rainier said he would have [place] call me. He is instilling 8 cans nutritional supplement daily via PEG. He did not express any needs or concerns at this time, I encouraged him to contact me if that changes, he verbalized understanding.    Patient Visit Type -  Treatment Phase -  Barriers/Navigation Needs -  Interventions -  Time Spent with Patient [place], RN, BSN, [place] at [place] [phone]

## 2015-05-28 NOTE — Telephone Encounter (Signed)
Oncology Nurse Navigator Documentation  Oncology Nurse Navigator Flowsheets 05/28/2015  Navigator Encounter Type Telephone  Kyle Alexander called to confirm address for [facility] where he has appt with Dr. [name] today at 0459.  I asked that he call me tomorrow to update me.  He agreed.  Patient Visit Type -  Treatment Phase -  Barriers/Navigation Needs -  Interventions -  Time Spent with Patient [place], RN, BSN, [place] at [place] [phone]

## 2015-05-29 ENCOUNTER — Ambulatory Visit: Payer: Medicaid Other

## 2015-05-29 ENCOUNTER — Telehealth: Payer: Self-pay | Admitting: *Deleted

## 2015-05-29 NOTE — Telephone Encounter (Signed)
Oncology Nurse Navigator Documentation  Oncology Nurse Navigator Flowsheets 05/29/2015  Navigator Encounter Type Telephone  Returned Kyle Alexander' VM.  Per his appt with Dr. [name] yesterday, he is having his trach replaced next week.  He stated that per visit today by Burman Riis RN, excoriation under trach strap is much improved s/p thorough cleaning and application of ointment, use of protective gauze.  I received VM from [place] to that effect earlier this afternoon. I confirmed his appts for next week:  Monday 1530 with Garald Balding at [facility], Tuesday 1640 with Dr. Valere Dross.   Patient Visit Type -  Treatment Phase -  Barriers/Navigation Needs -  Interventions -  Time Spent with Patient [place], RN, BSN, [place] at [place] [phone]

## 2015-05-29 NOTE — Telephone Encounter (Signed)
Oncology Nurse Navigator Documentation  Oncology Nurse Navigator Flowsheets 05/29/2015  Navigator Encounter Type Telephone  Called Dr. Noreene Filbert office to follow-up on Dabid' appt yesterday.  LVM with his RN Amy requesting return call.  Patient Visit Type -  Treatment Phase -  Barriers/Navigation Needs -  Interventions -  Time Spent with Patient [place], RN, BSN, [place] at [place] [phone]

## 2015-05-29 NOTE — Telephone Encounter (Signed)
Oncology Nurse Navigator Documentation  Oncology Nurse Navigator Flowsheets 05/28/2015  Navigator Encounter Type Telephone  Kyle Alexander, [name] RN who provides twice weekly visits to patient, called per my request.  He indicated that Kyle Alexander has developed skin breakdown under trach strap, he is able to provide necessary care.  I informed him that [name] called to inform he had appt with Dr. [name] this afternoon and he would be able to receive additional assessment/care at this time.  Patient Visit Type -  Treatment Phase -  Barriers/Navigation Needs -  Interventions -  Time Spent with Patient [place], RN, BSN, [place] at [place] [phone]

## 2015-06-01 ENCOUNTER — Ambulatory Visit: Payer: Medicaid Other | Attending: Radiation Oncology

## 2015-06-01 ENCOUNTER — Ambulatory Visit: Payer: Self-pay

## 2015-06-01 ENCOUNTER — Ambulatory Visit: Payer: Medicaid Other

## 2015-06-01 ENCOUNTER — Telehealth: Payer: Self-pay | Admitting: *Deleted

## 2015-06-01 DIAGNOSIS — R1312 Dysphagia, oropharyngeal phase: Secondary | ICD-10-CM | POA: Diagnosis not present

## 2015-06-01 NOTE — Therapy (Signed)
[facility] [address] [place], [place], [address] Phone: [phone]   Fax:  [phone]  Speech Language Pathology Evaluation  Patient Details  Name: Kyle Alexander MRN: [id number] Date of Birth: January 31, 1961 Referring Provider:  [name], MD  Encounter Date: 06/01/2015      End of Session - 06/01/15 1605    Visit Number 1   Number of Visits 4   Date for SLP Re-Evaluation 08/01/15   Authorization Type medicaid - awaiting auth   SLP Start Time 1520   SLP Stop Time  1600   SLP Time Calculation (min) 40 min   Activity Tolerance Patient tolerated treatment well      Past Medical History  Diagnosis Date  . Hypertension   . Cancer 03/16/15    left cervical lymph node  . Shortness of breath dyspnea   . GERD (gastroesophageal reflux disease)   . Seizures   . S/P radiation therapy 04/16/2015-05/18/2015    Laryngopharynx and bilateral neck / 50 Gy in 20 fractions to gross disease, 45 Gy in 20 fractions to high risk nodal echelons     Past Surgical History  Procedure Laterality Date  . Knee surgery  1998    Left  . Tonsillectomy and adenoidectomy    . Lymph node biopsy Left 03/16/15    left cervical, consistent with squamous cell carcinoma  . Hernia repair      right groin  . Tonsillectomy    . Tracheostomy tube placement N/A 03/26/2015    Procedure: TRACHEOSTOMY ;  Surgeon: [name], MD;  Location: [facility];  Service: ENT;  Laterality: N/A;  . Tooth extraction  03/26/2015    Procedure: [place];  Surgeon: [name], MD;  Location: [facility];  Service: ENT;;  . Multiple extractions with alveoloplasty N/A 03/26/2015    Procedure: EXTRACTION OF TOOTH #'S 1,2,5,6,7,8,9,10,11,12,13,18,19,21,22,23,24,25,26,27,28,29  WITH AVELOPLASTY;  Surgeon: Lenn Cal, DDS;  Location: [facility];  Service: Oral Surgery;  Laterality: N/A;  . Gastrostomy N/A 03/31/2015    Procedure: OPEN GASTROSTOMY TUBE PLACEMENT;  Surgeon: Rolm Bookbinder, MD;  Location: [facility];  Service: General;  Laterality: N/A;    There were no vitals filed for this visit.  Visit Diagnosis: Dysphagia, oropharyngeal      Subjective Assessment - 06/01/15 1557    Subjective Dr. [name] cleared pt to drink last week. Pt drinking ginger ale and water. Pt arrives with hydrophonic voice and reports copious thick phlegm pt has great difficulty clearing from pharynx/laryngeal area. Pt using PMSV with minimal dyspnea.            SLP Evaluation [facility] - 06/01/15 1533    SLP Visit Information   SLP Received On 06/01/15   Onset Date May 2016   Medical Diagnosis Stage IV supraglottic CA   Pain Assessment   Pain Score 0-No pain   General Information   Other Pertinent Information Pt underwent rad tx of aggressive palliation with small chance of cure from 04-16-15 to 05-18-15 to extend pt's lifespan. "It's (the cancer) real bad," pt stated.   Prior Functional Status   Cognitive/Linguistic Baseline Within functional limits   Cognition   Overall Cognitive Status Within Functional Limits for tasks assessed   Auditory Comprehension   Overall Auditory Comprehension Appears within functional limits for tasks assessed   Verbal Expression   Overall Verbal Expression Appears within functional limits for tasks assessed   Oral Motor/Sensory Function   Labial ROM Within Functional Limits  Labial Strength Within Functional Limits   Lingual ROM Within Functional Limits   Lingual Symmetry Within Functional Limits   Lingual Strength Within Functional Limits   Motor Speech   Overall Motor Speech Appears within functional limits for tasks assessed  pt endentulous     Pt with modified barium swallow exam (MBSS) 05-08-15 recommending NPO. See specific results for that report under "Imaging". NPO was recommended. POs were not provided today due to patient not undergoing oral care <30 minutes prior to appointment. Pt to see Dr. [name] this week to hopefully downsize  trach, per pt.   Thyroid elevation appeared reduced with spontaneous swallows.   One recommendation from MBSS included water in the form of ice chips after proper oral care, so SLP reviewed with pt what "proper oral care" was. Pt was able to tell SLP after 10 minutes delay.   After eval tasks, SLP then developed a HEP for pt and pt was instructed how to perform exercises involving lingual, vocal, and pharyngeal strengthening. SLP performed each exercise and pt return demonstrated each exercise. SLP ensured pt performance was correct prior to moving on to next exercise. Pt was instructed to complete this program 2-3 times a day, 6 or 7 days per week.          SLP Education - 06/01/15 1604    Education provided Yes   Education Details HEP, water protocol (ice chips, hygiene prior)   Person(s) Educated Patient   Methods Explanation;Demonstration;Verbal cues   Comprehension Returned demonstration;Verbalized understanding            SLP Long Term Goals - 06/01/15 1613    SLP LONG TERM GOAL #1   Title pt to demo HEP for dysphagia to incr possibility of POs/decr risk of aspiration with rare verbal cues    Baseline usual verbal and demo cues   Time 3   Period --  visits   Status New   SLP LONG TERM GOAL #2   Title pt will tell SLP signs/symptoms aspiration PNA with rare verbal cues   Baseline total A   Time 3   Period --  visits   Status New   SLP LONG TERM GOAL #3   Title pt will tell SLP why he is completing HEP    Baseline pt has not been told yet   Time 3   Period --  visits   Status New          Plan - 06/01/15 1606    Clinical Impression Statement Pt requires skilled ST for treatment for mild oral and moderate to severe pharyngeal dysphagia (ICD-10: R13.12) following radiation tx for supraglottic head and neck CA (ICD-10: C32.1) Pt will need to be followed by SLP to assess proper procedure with HEP and to assess safety with any PO intake.   Speech Therapy  Frequency 1x /week   Duration --  3 weeks   Treatment/Interventions Aspiration precaution training;Pharyngeal strengthening exercises;Oral motor exercises;Patient/family education;Compensatory strategies;SLP instruction and feedback   Potential to Achieve Goals Good   Potential Considerations Medical prognosis;Severity of impairments   SLP Home Exercise Plan provided today   Consulted and Agree with Plan of Care Patient        Problem List Patient Active Problem List   Diagnosis Date Noted  . Tracheostomy status   . Pharyngeal cancer   . Hypomagnesemia   . Tracheostomy care   . Head and neck cancer 03/27/2015  . 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  Vitamin D deficiency 03/17/2015  . Folate deficiency 03/16/2015  . Protein-calorie malnutrition, severe 03/16/2015  . HTN (hypertension) 03/15/2015  . Tobacco abuse 03/15/2015  . Alcohol abuse 03/15/2015  . Alcohol withdrawal seizure 03/15/2015  . Prolonged Q-T interval on ECG 03/15/2015  . Aortic dilatation 03/15/2015  . Thyroid nodule 03/15/2015  . Lymphadenopathy 03/15/2015  . Hypokalemia 03/15/2015  . B12 deficiency 03/15/2015  . Marijuana abuse 03/15/2015    [facility] [place], [place]  06/01/2015, 4:24 PM  [facility] [address] [place], [place], [address] Phone: [phone]   Fax:  [phone]

## 2015-06-01 NOTE — Patient Instructions (Signed)
SWALLOWING EXERCISES Do these 6 of the 7 days per week   1. Effortful Swallows - Squeeze hard with the muscles in your neck while you swallow your  saliva or a sip of water, no more than 30 minutes after you have performed oral care - Repeat 15-20 times, 2-3 times a day, and use whenever you eat or drink  2. Masako Swallow - swallow with your tongue sticking out - Stick tongue out and gently bite tongue with your teeth - Swallow, while holding your tongue with your teeth - Repeat 15-20 times, 2-3 times a day *use a wet spoon if your mouth gets dry*  3. Shaker Exercise - head lift - Lie flat on your back in your bed or on a couch without pillows - Raise your head and look at your feet  - KEEP YOUR SHOULDERS DOWN - HOLD FOR 60 SECONDS, then lower your head back down - Repeat 3 times, 2 times a day  4. Mendelsohn Maneuver - "half swallow" exercise - Start to swallow and squeeze tight to hold your Adam's apple up with the muscles of the throat - Hold for 7-10 seconds and then relax - Repeat 20-25 times, 3-4 times a day *use a wet spoon if your mouth gets dry*  5. Tongue Press - Press your entire tongue as hard as you can against the roof of your mouth for 7-10 seconds - Repeat 20 times, 3-4 times a day  6. Breath Hold - need to plug your trach - Say "HUH!" loudly, holding your breath for 3 seconds at your voice box - Repeat 20 times, 2-3 times a day  7. Chin pushback - Open your mouth  - Place your fist UNDER your chin near your neck, and push back with your fist for 7-10 seconds - Repeat 10 times, 2-3 times a day

## 2015-06-01 NOTE — Telephone Encounter (Signed)
Oncology Nurse Navigator Documentation  Oncology Nurse Navigator Flowsheets 06/01/2015  Navigator Encounter Type Telephone  Arvon called to confirm location of 1530 SLP appt with Garald Balding.  I explained appt is at [facility], he should register in main lobby, Glendell Docker will come for him.  He verbalized understanding.  Patient Visit Type -  Treatment Phase -  Barriers/Navigation Needs -  Interventions -  Time Spent with Patient [place], RN, BSN, [place] at [place] [phone]

## 2015-06-02 ENCOUNTER — Ambulatory Visit: Admission: RE | Admit: 2015-06-02 | Payer: MEDICAID | Source: Ambulatory Visit | Admitting: Radiation Oncology

## 2015-06-02 ENCOUNTER — Telehealth: Payer: Self-pay | Admitting: *Deleted

## 2015-06-02 ENCOUNTER — Ambulatory Visit: Payer: Medicaid Other

## 2015-06-02 HISTORY — DX: Personal history of irradiation: Z92.3

## 2015-06-02 NOTE — Telephone Encounter (Signed)
Oncology Nurse Navigator Documentation  Oncology Nurse Navigator Flowsheets 06/02/2015  Navigator Encounter Type Telephone  Patient called to confirm process for this afternoon's appt with Dr. Valere Dross.  He verbalized understanding that he registers upstairs and then comes to Radiation Waiting to wait for appt.  Patient Visit Type -  Treatment Phase -  Barriers/Navigation Needs -  Interventions -  Time Spent with Patient [place], RN, BSN, [place] at [place] [phone]

## 2015-06-03 ENCOUNTER — Ambulatory Visit: Payer: Medicaid Other

## 2015-06-12 ENCOUNTER — Telehealth: Payer: Self-pay | Admitting: *Deleted

## 2015-06-12 ENCOUNTER — Ambulatory Visit
Admission: RE | Admit: 2015-06-12 | Discharge: 2015-06-12 | Disposition: A | Discharge status: Home / Self Care | Payer: Medicaid Other | Source: Ambulatory Visit | Attending: Radiation Oncology | Admitting: Radiation Oncology

## 2015-06-12 VITALS — BP 101/76 | HR 99 | Temp 98.4°F | Resp 10 | Wt 140.0 lb

## 2015-06-12 DIAGNOSIS — C321 Malignant neoplasm of supraglottis: Secondary | ICD-10-CM

## 2015-06-12 NOTE — Telephone Encounter (Signed)
On 06-12-15 fax medical records to [facility] it was consult note, follow up note, end of tx note, sim & planning note.

## 2015-06-12 NOTE — Progress Notes (Signed)
Pain Status: He is currently in no pain. BP 101/76 mmHg  Pulse 99  Temp(Src) 98.4 F (36.9 C) (Oral)  Resp 10  Wt 140 lb (63.504 kg)  SpO2 100% Orthostatic Standing Vital Signs: BP:105/69 P:107 Pox:100%  Nutritional Status a) intake: Gastrostomy Tube b) using a feeding tube?: Osmolite 8 cans plus 722ml water c) weight changes, if any:  Wt Readings from Last 3 Encounters:  06/12/15 140 lb (63.504 kg)  05/18/15 130 lb 3.2 oz (59.058 kg)  05/13/15 128 lb (58.06 kg)    Swallowing Status: Pt denies dysphagia.  Smoking or chewing tobacco? yes  Dental (if applicable): When was last visit with dentistry no  Using fluoride trays daily? no   When was last ENT visit? June 2016  When is next ENT visit? July 5th  Summary of last medical oncology visit (if applicable) is.   Next med/onc visit is on.  Imaging done in the last month (if applicable) revealed: no  Other notable issues, if any: He reports he is still having a increased amount of thick sputum that is difficult to swallow.  He continues to do salt and baking soda rinse.  Not eating PO at all.

## 2015-06-12 NOTE — Progress Notes (Signed)
Radiation Oncology         [phone] ________________________________  Name: Kyle Alexander MRN: [id number]  Date: 06/12/2015  DOB: Nov 19, 1961  Follow-Up Visit Note  CC: Pcp Not In System  Juluis Mire, MD  Diagnosis and Prior Radiotherapy:       ICD-9-CM ICD-10-CM   1. Squamous cell carcinoma of supraglottis 161.1 C32.1 NM PET Image Restag (PS) Skull Base To Thigh     TSH   STAGE IVA [id number] Carcinoma of the supraglottis    Radiation treatment dates:   04/16/2015-05/18/2015 Site/dose:   Laryngopharynx and bilateral  neck / 50 Gy in 20  fractions to gross disease, 45 Gy in 20 fractions to high risk nodal echelons   Narrative:  The patient returns today for routine follow-up. Pain Status: He is currently in no pain.  Nutritional Status  a) intake: PEG Tube  b) using a feeding tube?: Osmolite 8 cans plus 710ml water  c) weight changes, if any: Weight gain noted. Wt Readings from Last 3 Encounters:   06/12/15  140 lb (63.504 kg)   05/18/15  130 lb 3.2 oz (59.058 kg)   05/13/15  128 lb (58.06 kg)    Swallowing Status: Pt denies dysphagia.  Smoking or chewing tobacco? Yes. Smokes 0.25 pks of cigarettes a day. Dental (if applicable): When was last visit with dentistry no Using fluoride trays daily? no  When was last ENT visit? June 2016 When is next ENT visit? July 5th  Imaging done in the last month (if applicable) revealed: no  Other notable issues, if any: He reports he is still having a increased amount of thick sputum that is difficult to swallow. He continues to do salt and baking soda rinse. Not eating PO at all as instructed by speech therapy. The pt is not using the biafine as instructed.   ALLERGIES:  is allergic to aspirin.  Meds: Current Outpatient Prescriptions  Medication Sig Dispense Refill  . emollient (BIAFINE) cream Apply 1 application topically 2 (two) times daily.    . ergocalciferol (DRISDOL) 8000 UNIT/ML drops Take 6.3 mLs (50,000 Units total) by mouth  once a week. (Patient not taking: Reported on 03/20/2015) 60 mL 7  . HYDROcodone-acetaminophen (HYCET) 7.5-325 mg/15 ml solution Take 10-15 mL with food in stomach, up to q4hrs, prn pain. (Patient not taking: Reported on 05/18/2015) 473 mL 0  . ibuprofen (ADVIL,MOTRIN) 200 MG tablet Take 200 mg by mouth every 6 (six) hours as needed for mild pain.    Marland Kitchen lidocaine (XYLOCAINE) 2 % solution Patient: Mix 1part 2% viscous lidocaine, 1part H20. Swish and/or swallow 40mL of this mixture, up to QID for mouth/throat soreness (Patient not taking: Reported on 05/18/2015) 100 mL 5  . LORazepam (ATIVAN) 0.5 MG tablet Take 1-2 tablets 30 min before radiotherapy for anxiety. (Patient not taking: Reported on 05/04/2015) 25 tablet 0  . nicotine (NICODERM CQ) 14 mg/24hr patch apply 21 mg patch daily x6wk, then 14 mg patch daily x2wk, then 7 mg patch daily x2wk (Patient not taking: Reported on 05/18/2015) 14 patch 0  . nicotine (NICODERM CQ) 21 mg/24hr patch apply 21 mg patch daily x6wk, then 14 mg patch daily x2wk, then 7 mg patch daily x2wk 21 patch 1  . nicotine (NICODERM CQ) 7 mg/24hr patch apply 21 mg patch daily x6wk, then 14 mg patch daily x2wk, then 7 mg patch daily x2wk (Patient not taking: Reported on 05/18/2015) 14 patch 0  . Nutritional Supplements (FEEDING SUPPLEMENT, OSMOLITE 1.2 CAL,) LIQD  Give 2 cans osmolite 1.2 QID with 60 cc free water before and after bolus feeding.  Give an additional 240 cc free water via PEG daily. 1896 mL 0  . polyethylene glycol (MIRALAX) packet Take 17 g by mouth daily. Take as needed to prevent constipation. 30 each 3  . sucralfate (CARAFATE) 1 G tablet Dissolve 1 tablet in 10 mL H20 and swallow up to QID PRN sore throat 50 tablet 5   No current facility-administered medications for this encounter.    Physical Findings: The patient is in no acute distress. Patient is alert and oriented. Wt Readings from Last 3 Encounters:  06/12/15 140 lb (63.504 kg)  05/18/15 130 lb 3.2 oz (59.058  kg)  05/13/15 128 lb (58.06 kg)    weight is 140 lb (63.504 kg). His oral temperature is 98.4 F (36.9 C). His blood pressure is 101/76 and his pulse is 99. His respiration is 10 and oxygen saturation is 100%. .  General: Alert and oriented, in no acute distress HEENT: Head is normocephalic. Extraocular movements are intact. Oropharynx is notable for no visible lesions. Brown coating on his tongue. Neck: Neck is notable for palpable bilateral level 2 masses which have regressed. Skin: Skin in treatment fields shows satisfactory healing. Slightly dry. Extremities: No cyanosis or edema. Lymphatics: see Neck Exam Psychiatric: Judgment and insight are intact. Affect is appropriate.   Lab Findings: Lab Results  Component Value Date   WBC 8.5 04/03/2015   HGB 9.8* 04/03/2015   HCT 29.5* 04/03/2015   MCV 98.0 04/03/2015   PLT 294 04/03/2015    Lab Results  Component Value Date   TSH 1.111 03/15/2015    Radiographic Findings: No results found.  Impression/Plan:    1) Head and Neck Cancer Status: Healing well from radiotherapy.  2) Nutritional Status: No PO food intake as instructed by speech therapy. PEG tube: Yes. Osmolite 8 cans plus 777ml water.  3) Risk Factors: The patient has been educated about risk factors including alcohol and tobacco abuse; they understand that avoidance of alcohol and tobacco is important to prevent recurrences as well as other cancers.  4) Swallowing: NPO per SLP  5) Dental: Encouraged to continue regular followup with dentistry, and dental hygiene including fluoride rinses.  6) Thyroid function:  Lab Results  Component Value Date   TSH 1.111 03/15/2015    7) Other: Order PET scan in September. F/u with me after that. Biafine over skin of neck.   8) Follow-up in 2 months with PET scan and TSH. The patient was encouraged to call with any issues or questions before then.   This document serves as a record of services personally performed by  [name], MD. It was created on her behalf by Darcus Austin, a trained medical scribe. The creation of this record is based on the scribe's personal observations and the provider's statements to them. This document has been checked and approved by the attending provider.     _____________________________________   [name], MD

## 2015-06-12 NOTE — Progress Notes (Deleted)
.  [place]

## 2015-06-12 NOTE — Addendum Note (Signed)
Encounter addended by: Jenene Slicker, RN on: 06/12/2015  5:41 PM<BR>     Documentation filed: Notes Section

## 2015-06-12 NOTE — Progress Notes (Signed)
Pt here for follow up appointment.  Pt requesting trach collar to be changed.  Cleansed neck with a warm wash cloth and applied a new trach collar.  Mr. Bena assisted as needed during trach care.  Gave additional trach collars, tissues and foam dressings.  Instructed pt to call nursing for further questions or concerns.

## 2015-06-16 ENCOUNTER — Encounter (HOSPITAL_COMMUNITY): Payer: Self-pay | Admitting: Dentistry

## 2015-06-16 ENCOUNTER — Telehealth: Payer: Self-pay | Admitting: *Deleted

## 2015-06-16 ENCOUNTER — Ambulatory Visit (HOSPITAL_COMMUNITY): Payer: Self-pay | Admitting: Dentistry

## 2015-06-16 NOTE — Progress Notes (Signed)
06/16/2015  This encounter was created in error - please disregard.

## 2015-06-16 NOTE — Telephone Encounter (Signed)
CALLED PATIENT TO INFORM OF LAB, PET FOR 08-31-15 AND HIS FU WITH DR. Isidore Moos ON 09-02-15 @ 4 PM, LVM FOR A RETURN CALL

## 2015-06-17 ENCOUNTER — Other Ambulatory Visit (HOSPITAL_COMMUNITY): Payer: Self-pay | Admitting: Dentistry

## 2015-06-23 ENCOUNTER — Telehealth: Payer: Self-pay | Admitting: *Deleted

## 2015-06-23 ENCOUNTER — Ambulatory Visit (HOSPITAL_COMMUNITY): Payer: Self-pay | Admitting: Dentistry

## 2015-06-23 ENCOUNTER — Encounter (HOSPITAL_COMMUNITY): Payer: Self-pay | Admitting: Dentistry

## 2015-06-23 ENCOUNTER — Encounter: Payer: Self-pay | Admitting: *Deleted

## 2015-06-23 VITALS — BP 117/80 | HR 83 | Temp 98.2°F | Wt 140.0 lb

## 2015-06-23 DIAGNOSIS — K08109 Complete loss of teeth, unspecified cause, unspecified class: Secondary | ICD-10-CM

## 2015-06-23 DIAGNOSIS — K0820 Unspecified atrophy of edentulous alveolar ridge: Secondary | ICD-10-CM

## 2015-06-23 DIAGNOSIS — K117 Disturbances of salivary secretion: Secondary | ICD-10-CM

## 2015-06-23 DIAGNOSIS — R1310 Dysphagia, unspecified: Secondary | ICD-10-CM

## 2015-06-23 DIAGNOSIS — Z923 Personal history of irradiation: Secondary | ICD-10-CM

## 2015-06-23 DIAGNOSIS — Z0189 Encounter for other specified special examinations: Secondary | ICD-10-CM

## 2015-06-23 DIAGNOSIS — R682 Dry mouth, unspecified: Secondary | ICD-10-CM

## 2015-06-23 DIAGNOSIS — C321 Malignant neoplasm of supraglottis: Secondary | ICD-10-CM

## 2015-06-23 DIAGNOSIS — Z930 Tracheostomy status: Secondary | ICD-10-CM

## 2015-06-23 NOTE — Patient Instructions (Addendum)
RECOMMENDATIONS: 1. Brush tongue daily. 2. Use trismus exercises as directed. 3. Use Biotene Rinse or salt water/baking soda rinses. 4. Multiple sips of water as needed. 5. Return to the dentist of his choice for fabrication of upper lower complete dentures. Medicaid prior approval will need to be obtained prior to start of dentures for approximately 08/18/2015.   Lenn Cal, DDS     RADIATION THERAPY AND DECISIONS REGARDING YOUR TEETH  Xerostomia (dry mouth) Your salivary glands may be in the filed of radiation.  Radiation may include all or part of your saliva glands.  This will cause your saliva to dry up and you will have a dry mouth.  The dry mouth will be for the rest of your life unless your radiation oncologist tells you otherwise.  Your saliva has many functions:  Saliva wets your tongue for speaking.  It coats your teeth and the inside of your mouth for easier movement.  It helps with chewing and swallowing food.  It helps clean away harmful acid and toxic products made by the germs in your mouth, therefore it helps prevent cavities.  It kills some germs in your mouth and helps to prevent gum disease.  It helps to carry flavor to your taste buds.  Once you have lost your saliva you will be at higher risk for tooth decay and gum disease.  What can be done to help improve your mouth when there's not enough saliva:  1.  Your dentist may give a prescription for Salagen.  It will not bring back all of your saliva but may bring back some of it.  Also your saliva may be thick and ropy or white and foamy. It will not feel like it use to feel.  2.  You will need to swish with water every time your mouth feels dry.  YOU CANNOT suck on any cough drops, mints, lemon drops, candy, vitamin C or any other products.  You cannot use anything other than water to make your mouth feel less dry.  If you want to drink anything else you have to drink it all at once and brush  afterwards.  Be sure to discuss the details of your diet habits with your dentist or hygienist.  Radiation caries: This is decay that happens very quickly once your mouth is very dry due to radiation therapy.  Normally cavities take six months to two years to become a problem.  When you have dry mouth cavities may take as little as eight weeks to cause you a problem.  This is why dental check ups every two months are necessary as long as you have a dry mouth. Radiation caries typically, but not always, start at your gum line where it is hard to see the cavity.  It is therefore also hard to fill these cavities adequately.  This high rate of cavities happens because your mouth no longer has saliva and therefore the acid made by the germs starts the decay process.  Whenever you eat anything the germs in your mouth change the food into acid.  The acid then burns a small hole in your tooth.  This small hole is the beginning of a cavity.  If this is not treated then it will grow bigger and become a cavity.  The way to avoid this hole getting bigger is to use fluoride every evening as prescribed by your dentist.  You have to make sure that your teeth are very clean before you use the  fluoride.  This fluoride in turn will strengthen your teeth and prepare them for another day of fighting acid.  If you develop radiation caries many times the damage is so large that you will have to have all your teeth removed.  This could be a big problem if some of these teeth are in the field of radiation.  Further details of why this could be a big problem will follow.  (See Osteoradionecrosis).  Loss of taste (dysgeusia) This happens to varying degrees once you've had radiation therapy to your jaw region.  Many times taste is not completely lost but becomes limited.  The loss of taste is mostly due to radiation affecting your taste buds.  However if you have no saliva in your mouth to carry the flavor to your taste buds it would  be difficult for your taste buds to taste anything.  That is why using water or a prescription for Salagen prior to meals and during meals may help with some of the taste.  Keep in mind that taste generally returns very slowly over the course of several months or several years after radiation therapy.  Don't give up hope.  Trismus According to your Radiation Oncologist your TMJ or jaw joints are going to be partially or fully in the field of radiation.  This means that over time the muscles that help you open and close your mouth may get stiff.  This will potentially result in your not being able to open your mouth wide enough or as wide as you can open it now.  Le me give you an example of how slowly this happens and how unaware people are of it.  A gentlemen that had radiation therapy two years ago came back to me complaining that bananas are just too large for him to be able to fit them in between his teeth.  He was not able to open wide enough to bite into a banana.  This happens slowly and over a period of time.  What do we do to try and prevent this?  Your dentist will probably give you a stack of sticks called a trismus exercise device .  This stack will help your remind your muscles and your jaw joint to open up to the same distance every day.  Use these sticks every morning when you wake up according to the instructions given by the dentist.   You must use these sticks for at least one to two years after radiation therapy.  The reason for that is because it happens so slowly and keeps going on for about two years after radiation therapy.  Your hospital dentist will help you monitor your mouth opening and make sure that it's not getting smaller.  Osteoradionecrosis (ORN) This is a condition where your jaw bone after having had radiation therapy becomes very dry.  It has very little blood supply to keep it alive.  If you develop a cavity that turns into an abscess or an infection then the jaw bone  does not have enough blood supply to help fight the infection.  At this point it is very likely that the infection could cause the death of your jaw bone.  When you have dead bone it has to be removed.  Therefore you might end up having to have surgery to remove part of your jaw bone, the part of the jaw bone that has been affected.   Healing is also a problem if you are to have surgery  in the areas where the bone has had radiation therapy.  The same reasons apply.  If you have surgery you need more blood supply which is not available.  When blood supply and oxygen are not available again, there is a chance for the bone to die.  Occasionally ORN happens on its own with no obvious reason.  This is quite rare.  We believe that patients who continue to smoke and/or drink alcohol have a higher chance of having this bone problem.  Therefore once your jaw bone has had radiation therapy if there are any teeth in that area, you should never have them pulled.  You should also never have any surgery on your teeth or gums in that area unless the oral surgeon or Periodontist is aware of your history of radiation. There is some expensive management techniques that might be used to limit your risks.  The risks for ORN either from infection or spontaneous ( or on it's own) are life long.    TRISMUS  Trismus is a condition where the jaw does not allow the mouth to open as wide as it usually does.  This can happen almost suddenly, or in other cases the process is so slow, it is hard to notice it-until it is too far along.  When the jaw joints and/or muscles have been exposed to radiation treatments, the onset of Trismus is very slow.  This is because the muscles are losing their stretching ability over a long period of time, as long as 2 YEARS after the end of radiation.  It is therefore important to exercise these muscles and joints.  TRISMUS EXERCISES   Stack of tongue depressors measuring the same or a little less  than the last documented MIO (Maximum Interincisal Opening).  Secure them with a rubber band on both ends.  Place the stack in the patient's mouth, supporting the other end.  Allow 30 seconds for muscle stretching.  Rest for a few seconds.  Repeat 3-5 times  For all radiation patients, this exercise is recommended in the mornings and evenings unless otherwise instructed.  The exercise should be done for a period of 2 YEARS after the end of radiation.  MIO should be checked routinely on recall dental visits by the general dentist or the hospital dentist.  The patient is advised to report any changes, soreness, or difficulties encountered when doing the exercises.

## 2015-06-23 NOTE — Progress Notes (Signed)
06/23/2015  Patient Name:   Kyle Alexander Date of Birth:   [birth year] Medical Record Number: [id number]  BP 117/80 mmHg  Pulse 83  Temp(Src) 98.2 F (36.8 C) (Oral)  Wt 140 lb (63.504 kg)  Kyle Alexander presents for oral examination after radiation therapy. Patient has completed all radiation treatments from 04/16/15 through 05/18/15. Patient has tracheostomy in place will not be removed until after the PET scan scheduled for mid September 2016.  REVIEW OF CHIEF COMPLAINTS:  DRY MOUTH: Patient has dry mouth. HARD TO SWALLOW: Yes  HURT TO SWALLOW: No TASTE CHANGES: Taste is returning slowly. SORES IN MOUTH: No TRISMUS: No trismus symptoms. WEIGHT: 140 pounds  HOME OH REGIMEN:  BRUSHING: Patient instructed to brush his tongue daily. FLOSSING: Not applicable RINSING: Salt water and baking soda rinses. Biotene rinses. FLUORIDE: Not applicable TRISMUS EXERCISES:  Maximum interincisal opening: 45 mm. Patient again encouraged to start performing trismus exercises daily.   DENTAL EXAM:  Oral Hygiene:(PLAQUE): Edentulous LOCATION OF MUCOSITIS: None noted DESCRIPTION OF SALIVA: Decreased saliva. Moderate xerostomia ANY EXPOSED BONE: None noted OTHER WATCHED AREAS: Previous extraction sites.  DX: Xerostomia, Dysgeusia, Dysphagia and Edentulous, atrophy of edentulous alveolar ridges.  RECOMMENDATIONS: 1. Brush tongue daily. 2. Use trismus exercises as directed. 3. Use Biotene Rinse or salt water/baking soda rinses. 4. Multiple sips of water as needed. 5. Return to the dentist of his choice for fabrication of upper lower complete dentures. Medicaid prior approval will need to be obtained prior to start of dentures for approximately 08/18/2015.   Lenn Cal, DDS

## 2015-06-23 NOTE — Telephone Encounter (Signed)
  Oncology Nurse Navigator Documentation   Navigator Encounter Type: Telephone (06/23/15 0831)      Patient called with request for help with changing trach collar after his 1000 appt with Dr. Enrique Sack this morning, stated it hasn't been changed since 06/12/15.  I instructed him to come to Radiation Waiting after dental appt and have scheduler call Nursing.  Malachy Mood and Santiago Glad notified.   Gayleen Orem, RN, BSN, [place] at [place] [phone]

## 2015-06-23 NOTE — Progress Notes (Signed)
  Oncology Nurse Navigator Documentation   Navigator Encounter Type: Clinic/MDC (06/23/15 1045)      To provide support and encouragement, care continuity and to assess for needs, met with patient during his visit to [place] for help changing trach strap.  He reported:  Seeing Dr. [name] next week.  Activity level has increased, he is walking more.  Home Health is no longer making home visits.  His living situation is stable.  He is managing PEG and trach without difficulty.  He is instilling 8 cans nutritional supplement daily via PEG.  He commented that today's weight of 140 lbs is the same as a couple of weeks ago.  He expected to gain weight but recognizes that his increased activity is a factor in weight stability.  Having BMs every other day which is his baseline. He did not express any needs or concerns at this time, I encouraged him to contact me if that changes, he verbalized understanding.     Gayleen Orem, RN, BSN, [place] at [place] [phone]

## 2015-06-26 ENCOUNTER — Ambulatory Visit: Payer: MEDICAID | Admitting: Radiation Oncology

## 2015-06-26 ENCOUNTER — Inpatient Hospital Stay
Admission: RE | Admit: 2015-06-26 | Discharge: 2015-06-26 | Disposition: A | Discharge status: Home / Self Care | Payer: MEDICAID | Source: Ambulatory Visit | Attending: Radiation Oncology | Admitting: Radiation Oncology

## 2015-07-01 ENCOUNTER — Other Ambulatory Visit (HOSPITAL_COMMUNITY): Payer: Self-pay | Admitting: Otolaryngology

## 2015-07-01 DIAGNOSIS — K9420 Gastrostomy complication, unspecified: Secondary | ICD-10-CM

## 2015-07-02 ENCOUNTER — Other Ambulatory Visit (HOSPITAL_COMMUNITY): Payer: Self-pay

## 2015-07-03 ENCOUNTER — Ambulatory Visit (HOSPITAL_COMMUNITY)
Admission: RE | Admit: 2015-07-03 | Discharge: 2015-07-03 | Disposition: A | Discharge status: Home / Self Care | Payer: Medicaid Other | Source: Ambulatory Visit | Attending: Otolaryngology | Admitting: Otolaryngology

## 2015-07-03 ENCOUNTER — Other Ambulatory Visit (HOSPITAL_COMMUNITY): Payer: Self-pay | Admitting: Otolaryngology

## 2015-07-03 DIAGNOSIS — K9420 Gastrostomy complication, unspecified: Secondary | ICD-10-CM

## 2015-07-03 DIAGNOSIS — Z431 Encounter for attention to gastrostomy: Secondary | ICD-10-CM | POA: Diagnosis present

## 2015-07-03 MED ORDER — IOHEXOL 300 MG/ML  SOLN
50.0000 mL | Freq: Once | INTRAMUSCULAR | Status: AC | PRN
  Administered 2015-07-03: 20 mL via INTRAVENOUS

## 2015-08-12 ENCOUNTER — Telehealth: Payer: Self-pay | Admitting: *Deleted

## 2015-08-12 NOTE — Telephone Encounter (Signed)
  Oncology Nurse Navigator Documentation    Navigator Encounter Type: Telephone (08/12/15 1525)         Interventions: Other (08/12/15 1525)     Patient called for clarification of upcoming appt with Dr. Enrique Sack.  I confirmed 9/6 10:15 appt, he verbalized understanding. I confirmed his understanding of additional upcoming appts. He reported that he has been doing well.  Dr. [name] changed his trach about a month ago, he had balloon replace on PEG recently. He denied needs/concerns, understands he can contact me.  Gayleen Orem, RN, BSN, [place] at [place] [phone]           Time Spent with Patient: 15 (08/12/15 1525)

## 2015-08-18 ENCOUNTER — Encounter (HOSPITAL_COMMUNITY): Payer: Self-pay | Admitting: Dentistry

## 2015-08-18 ENCOUNTER — Ambulatory Visit (HOSPITAL_COMMUNITY): Payer: Medicaid - Dental | Admitting: Dentistry

## 2015-08-18 VITALS — BP 121/74 | HR 95 | Temp 98.4°F

## 2015-08-18 DIAGNOSIS — Z923 Personal history of irradiation: Secondary | ICD-10-CM

## 2015-08-18 DIAGNOSIS — K0820 Unspecified atrophy of edentulous alveolar ridge: Secondary | ICD-10-CM

## 2015-08-18 DIAGNOSIS — C321 Malignant neoplasm of supraglottis: Secondary | ICD-10-CM

## 2015-08-18 DIAGNOSIS — Z463 Encounter for fitting and adjustment of dental prosthetic device: Secondary | ICD-10-CM

## 2015-08-18 DIAGNOSIS — K08109 Complete loss of teeth, unspecified cause, unspecified class: Secondary | ICD-10-CM

## 2015-08-18 NOTE — Patient Instructions (Signed)
Return to clinic as scheduled for continued upper and lower complete denture fabrication. Dr. Dayvin Aber 

## 2015-08-18 NOTE — Progress Notes (Signed)
08/18/2015  Patient Name:   Kyle Alexander Date of Birth:   May 17, 1961 Medical Record Number: [id number]  BP 121/74 mmHg  Pulse 95  Temp(Src) 98.4 F (36.9 C) (Oral)  [name] presents for start of upper and lower denture fabrication.  Exam: Patient is edentulous. Discussed procedures involved in upper and lower denture fabrication and prognosis for successful ability to wear dentures. Price for dentures confirmed.  Patient agrees to proceed with upper and lower denture fabrication. Procedure:  Upper and lower denture primary impressions in alginate. Lab pour. To [facility] for upper and lower denture custom tray fabrication. RTC for upper and lower denture final impressions.  Lenn Cal, DDS

## 2015-08-25 ENCOUNTER — Encounter (HOSPITAL_COMMUNITY): Payer: Self-pay | Admitting: Dentistry

## 2015-08-26 ENCOUNTER — Ambulatory Visit (HOSPITAL_COMMUNITY): Payer: Medicaid - Dental | Admitting: Dentistry

## 2015-08-26 ENCOUNTER — Encounter (HOSPITAL_COMMUNITY): Payer: Self-pay | Admitting: Dentistry

## 2015-08-26 VITALS — BP 115/73 | HR 76 | Temp 98.4°F

## 2015-08-26 DIAGNOSIS — C321 Malignant neoplasm of supraglottis: Secondary | ICD-10-CM

## 2015-08-26 DIAGNOSIS — K08109 Complete loss of teeth, unspecified cause, unspecified class: Secondary | ICD-10-CM

## 2015-08-26 DIAGNOSIS — Z463 Encounter for fitting and adjustment of dental prosthetic device: Secondary | ICD-10-CM

## 2015-08-26 DIAGNOSIS — K0820 Unspecified atrophy of edentulous alveolar ridge: Secondary | ICD-10-CM

## 2015-08-26 DIAGNOSIS — Z923 Personal history of irradiation: Secondary | ICD-10-CM

## 2015-08-26 NOTE — Progress Notes (Signed)
08/26/2015  Patient Name:   Kyle Alexander Date of Birth:   14-May-1961 Medical Record Number: [id number]  BP 115/73 mmHg  Pulse 76  Temp(Src) 98.4 F (36.9 C) (Oral)  [name] presents for continued upper and lower complete denture fabrication.  Procedure:  Upper and lower border molding and final impressions in Aquasil. Patient tolerated procedure well. To [facility] for custom baseplates with rims. Return to clinic for upper and lower complete denture jaw relations.  Lenn Cal, DDS

## 2015-08-26 NOTE — Patient Instructions (Signed)
Return to clinic as scheduled for continued upper lower complete denture fabrication. Dr. Kulinski 

## 2015-08-31 ENCOUNTER — Other Ambulatory Visit: Payer: Self-pay | Admitting: Radiation Oncology

## 2015-08-31 ENCOUNTER — Ambulatory Visit (HOSPITAL_COMMUNITY): Payer: Medicaid Other

## 2015-08-31 ENCOUNTER — Ambulatory Visit
Admission: RE | Admit: 2015-08-31 | Discharge: 2015-08-31 | Disposition: A | Discharge status: Home / Self Care | Payer: Medicaid Other | Source: Ambulatory Visit | Attending: Radiation Oncology | Admitting: Radiation Oncology

## 2015-08-31 DIAGNOSIS — C321 Malignant neoplasm of supraglottis: Secondary | ICD-10-CM

## 2015-08-31 DIAGNOSIS — E038 Other specified hypothyroidism: Secondary | ICD-10-CM | POA: Diagnosis not present

## 2015-08-31 LAB — TSH CHCC: TSH: 4.566 m[IU]/L — AB (ref 0.320–4.118)

## 2015-09-01 ENCOUNTER — Telehealth: Payer: Self-pay | Admitting: *Deleted

## 2015-09-01 LAB — T4, FREE: FREE T4: 0.79 ng/dL — AB (ref 0.80–1.80)

## 2015-09-01 NOTE — Telephone Encounter (Signed)
  Oncology Nurse Navigator Documentation   Navigator Encounter Type: Telephone (09/01/15 1658)         Interventions: Coordination of Care (09/01/15 1658)     Per Dr. Isidore Moos, called patient to see if available for 2:00 pm appt tomorrow rather than 4:00.  He indicated he was, understands to arrive at [place] at 3:45.   Gayleen Orem, RN, BSN, [place] at [place] [phone]           Time Spent with Patient: 15 (09/01/15 1658)

## 2015-09-02 ENCOUNTER — Ambulatory Visit
Admission: RE | Admit: 2015-09-02 | Discharge: 2015-09-02 | Disposition: A | Discharge status: Home / Self Care | Payer: Medicaid Other | Source: Ambulatory Visit | Attending: Radiation Oncology | Admitting: Radiation Oncology

## 2015-09-02 ENCOUNTER — Encounter: Payer: Self-pay | Admitting: Radiation Oncology

## 2015-09-02 ENCOUNTER — Encounter: Payer: Self-pay | Admitting: *Deleted

## 2015-09-02 VITALS — BP 131/89 | HR 80 | Temp 98.5°F | Ht 70.0 in | Wt 144.1 lb

## 2015-09-02 DIAGNOSIS — C321 Malignant neoplasm of supraglottis: Secondary | ICD-10-CM

## 2015-09-02 MED ORDER — NICOTINE 7 MG/24HR TD PT24: MEDICATED_PATCH | TRANSDERMAL | Status: DC

## 2015-09-02 NOTE — Progress Notes (Signed)
Provided patient with saline, hypofix tape, soft tooth brush, and gauze to manage PEG, trach, and mouth care.

## 2015-09-02 NOTE — Progress Notes (Signed)
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  Pain issues, if any:no  Using a feeding tube?: yes, osmolite 6 cans a day Weight changes, if any: 4 lb weight gain noted since 06/23/15 Swallowing issues, if any: sipping fluids by mouth without difficulty but, not eating any food by mouth  Smoking or chewing tobacco? 8 cigarettes a week Using fluoride trays daily? All teeth removed; awaiting dentures; doesn't have fluoride trays Last ENT visit was on: scheduled to follow up with [facility] this week Other notable issues, if any: Requesting refill of nicoderm 21 patch, reports dry mouth, thick white coating noted on tongue, sore at trach opening has healed completely   He was supposed to have a PET scan 2 days ago but this was cancelled because he had excessive secretions through his trach and the technologist felt that it would not be safe to scan him.  I spoke with Dr. [name] about his circumstances and Dr. [name] is graciously scheduling another follow up with the patient in the near future for laryngoscopy and evaluation of the patients excessive secretions.   He has not seen a swallow therapist.  He notes that he is still ?aspirating   ALLERGIES:  is allergic to aspirin.  Meds: Current Outpatient Prescriptions  Medication Sig Dispense Refill  . emollient (BIAFINE) cream Apply 1 application topically 2  (two) times daily.    . nicotine (NICODERM CQ) 21 mg/24hr patch apply 21 mg patch daily x6wk, then 14 mg patch daily x2wk, then 7 mg patch daily x2wk 21 patch 1  . Nutritional Supplements (FEEDING SUPPLEMENT, OSMOLITE 1.2 CAL,) LIQD Give 2 cans osmolite 1.2 QID with 60 cc free water before and after bolus feeding.  Give an additional 240 cc free water via PEG daily. 1896 mL 0  . ibuprofen (ADVIL,MOTRIN) 200 MG tablet Take 200 mg by mouth every 6 (six) hours as needed for mild pain.    Marland Kitchen LORazepam (ATIVAN) 0.5 MG tablet Take 1-2 tablets 30 min before radiotherapy for anxiety. (Patient not taking: Reported on 05/04/2015) 25 tablet 0   No current facility-administered medications for this encounter.    Physical Findings: The patient is in no acute distress. Patient is alert and oriented. Wt Readings from Last 3 Encounters:  09/02/15 144 lb 1.6 oz (65.363 kg)  06/23/15 140 lb (63.504 kg)  06/12/15 140 lb (63.504 kg)    height is 5\' 10"  (1.778 m) and weight is 144 lb 1.6 oz (65.363 kg). His oral temperature is 98.5 F (36.9 C). His blood pressure is 131/89 and his pulse is 80. His oxygen saturation is 100%. .  General: Alert and oriented, in no acute distress HEENT: Mucous membranes moist.  White coating to tongue but does not appear to be thrush.  No oropharengeal legions Skin: Skin in treatment fields shows satisfactory healing.  Neck: Anterior firm lymphedema which extends through the anterior regions of 2 and 3 bilaterally.  No obvious palpable cervical or supraclavicular masses. Trach. Extremities: No cyanosis or edema. Lymphatics: see Neck Exam Psychiatric: Judgment and insight are intact. Affect is appropriate.   Lab Findings: Lab Results  Component Value Date   WBC 8.5 04/03/2015   HGB 9.8* 04/03/2015   HCT 29.5* 04/03/2015   MCV 98.0 04/03/2015   PLT 294 04/03/2015    Lab Results  Component Value Date   TSH 4.566* 08/31/2015    Radiographic Findings: No results  found.  Impression/Plan:    1) Head and Neck Cancer Status: Healing well from radiotherapy. Physical exam is improved.  2) Nutritional Status: weight gain  3) Risk Factors: The patient has been educated about risk factors including alcohol and tobacco abuse; they understand that avoidance of alcohol and tobacco is important to prevent recurrences as well as other cancers.   4) Swallowing:  refer back to SLP  5) Dental: Encouraged to continue regular followup with dentistry, and dental hygiene including fluoride rinses.  6) Thyroid function: discuss supplementation at next followup Lab Results  Component Value Date   TSH 4.566* 08/31/2015  Free T4 0.79 (low)  7) Other:    Re-referral to Swallowing therapist Pt will have an appt with Dr. [name] in 3 weeks tentatively -- After PET. Follow up after PET in near future - will ask for PM appt for his PET, when secretions are less severe. Nicotine patch refill, 14 Saline prescription for trach  This document serves as a record of services personally performed by [name], MD. It was created on her behalf by Janace Hoard, a trained medical scribe. The creation of this record is based on the scribe's personal observations and the provider's statements to them. This document has been checked and approved by the attending provider.     _____________________________________   [name], MD

## 2015-09-02 NOTE — Progress Notes (Signed)
Pain issues, if any:no  Using a feeding tube?: yes, osmolite 6 cans a day Weight changes, if any: 4 lb weight gain noted since 06/23/15 Swallowing issues, if any: sipping fluids by mouth without difficulty but, not eating any food by mouth  Smoking or chewing tobacco? 8 cigarettes a week Using fluoride trays daily? All teeth removed; awaiting dentures; doesn't have fluoride trays Last ENT visit was on: scheduled to follow up with [facility] this week Other notable issues, if any: Requesting refill of nicoderm 21 patch, reports dry mouth, thick white coating noted on tongue, sore at trach opening has healed completely   BP 131/89 mmHg  Pulse 80  Temp(Src) 98.5 F (36.9 C) (Oral)  Ht 5\' 10"  (1.778 m)  Wt 144 lb 1.6 oz (65.363 kg)  BMI 20.68 kg/m2  SpO2 100% Wt Readings from Last 3 Encounters:  09/02/15 144 lb 1.6 oz (65.363 kg)  06/23/15 140 lb (63.504 kg)  06/12/15 140 lb (63.504 kg)

## 2015-09-03 ENCOUNTER — Encounter (HOSPITAL_COMMUNITY): Payer: Self-pay | Admitting: Dentistry

## 2015-09-03 ENCOUNTER — Ambulatory Visit (HOSPITAL_COMMUNITY): Payer: Medicaid - Dental | Admitting: Dentistry

## 2015-09-03 ENCOUNTER — Telehealth: Payer: Self-pay | Admitting: *Deleted

## 2015-09-03 ENCOUNTER — Other Ambulatory Visit (HOSPITAL_COMMUNITY): Payer: Self-pay | Admitting: Radiation Oncology

## 2015-09-03 VITALS — BP 119/80 | HR 90 | Temp 98.1°F

## 2015-09-03 DIAGNOSIS — Z463 Encounter for fitting and adjustment of dental prosthetic device: Secondary | ICD-10-CM

## 2015-09-03 DIAGNOSIS — K0820 Unspecified atrophy of edentulous alveolar ridge: Secondary | ICD-10-CM

## 2015-09-03 DIAGNOSIS — K08109 Complete loss of teeth, unspecified cause, unspecified class: Secondary | ICD-10-CM

## 2015-09-03 DIAGNOSIS — C321 Malignant neoplasm of supraglottis: Secondary | ICD-10-CM

## 2015-09-03 DIAGNOSIS — Z923 Personal history of irradiation: Secondary | ICD-10-CM

## 2015-09-03 DIAGNOSIS — R1314 Dysphagia, pharyngoesophageal phase: Secondary | ICD-10-CM

## 2015-09-03 NOTE — Telephone Encounter (Signed)
Called patient to inform of Pet, MBSS and Garald Balding appt., lvm for a return call

## 2015-09-03 NOTE — Progress Notes (Signed)
09/03/2015  Patient Name:   Kyle Alexander Date of Birth:   [birth year] Medical Record Number: [id number]  BP 119/80 mmHg  Pulse 90  Temp(Src) 98.1 F (36.7 C) (Oral)  Kyle Alexander presents for continued denture fabrication.  Procedure:  Upper and lower denture Jaw relations with aluwax bite registration. Patient agrees to tooth selection of 25G, R, and 10 degree posteriors to match with Portrait A2 shade. Used extracted teeth as guide. Patient tolerated procedure well. RTC for denture wax try in.   Lenn Cal, DDS

## 2015-09-03 NOTE — Patient Instructions (Signed)
Return to clinic as scheduled for continued upper and lower complete denture fabrication. Dr. Kulinski 

## 2015-09-05 NOTE — Progress Notes (Signed)
  Oncology Nurse Navigator Documentation   Navigator Encounter Type: Clinic/MDC (09/02/15 1420) Patient Visit Type: Radonc (09/02/15 1420)     To provide support and encouragement, care continuity and to assess for needs, met with Jeneen Rinks during f/u appt with Dr. Isidore Moos. He indicated PET not conducted Monday morning b/c of secretions. We discussed rescheduling of PET for the afternoon when he acknowledges secretions are not as copious.   Appt with Dr. [name] to be rescheduled in 3 weeks. He acknowleded:  Smoking ca 8 cig/wk, which is a notable reduction.  He was encouraged to keep trying total cessation.  Still living by self, same residence. PEG site evaluated - clean, dry, signs of infection absent. He understands I can be contacted with needs/concerns.  Gayleen Orem, RN, BSN, [place] at [place] [phone]                      Time Spent with Patient: 30 (09/02/15 1420)

## 2015-09-07 ENCOUNTER — Telehealth: Payer: Self-pay | Admitting: *Deleted

## 2015-09-07 NOTE — Telephone Encounter (Signed)
  Oncology Nurse Navigator Documentation   Navigator Encounter Type: Telephone (09/07/15 6222)         Interventions: Coordination of Care (09/07/15 9798)     Per Dr. Pearlie Oyster guidance, called [facility] to arrange follow-up visit.  Spoke with Rose, requested that patient be contacted and appt arranged to see Dr. [name] in ca 2 weeks.  I noted PET scan scheduled for 9/30, pt is interested in trach removal.  She verbalized understanding.  Gayleen Orem, RN, BSN, [place] at [place] [phone]            Time Spent with Patient: 15 (09/07/15 0952)

## 2015-09-10 ENCOUNTER — Telehealth: Payer: Self-pay | Admitting: *Deleted

## 2015-09-10 NOTE — Telephone Encounter (Signed)
  Oncology Nurse Navigator Documentation   Navigator Encounter Type: Telephone (09/10/15 6314)     Patient called with request for updated printed schedule for future appts.  He verbalized understanding that I would leave schedule at from desk of [facility] lobby for his retrieval.  Gayleen Orem, RN, BSN, [place] at [place] [phone]                     Time Spent with Patient: 15 (09/10/15 0916)

## 2015-09-11 ENCOUNTER — Ambulatory Visit (HOSPITAL_COMMUNITY)
Admission: RE | Admit: 2015-09-11 | Discharge: 2015-09-11 | Disposition: A | Discharge status: Home / Self Care | Payer: Medicaid Other | Source: Ambulatory Visit | Attending: Radiation Oncology | Admitting: Radiation Oncology

## 2015-09-11 DIAGNOSIS — C321 Malignant neoplasm of supraglottis: Secondary | ICD-10-CM

## 2015-09-14 ENCOUNTER — Ambulatory Visit (HOSPITAL_COMMUNITY): Payer: Medicaid - Dental | Admitting: Dentistry

## 2015-09-14 ENCOUNTER — Ambulatory Visit
Admission: RE | Admit: 2015-09-14 | Discharge: 2015-09-14 | Disposition: A | Discharge status: Home / Self Care | Payer: Medicaid Other | Source: Ambulatory Visit | Attending: Radiation Oncology | Admitting: Radiation Oncology

## 2015-09-14 ENCOUNTER — Encounter (HOSPITAL_COMMUNITY): Payer: Self-pay | Admitting: Dentistry

## 2015-09-14 VITALS — BP 120/71 | HR 88 | Temp 98.5°F

## 2015-09-14 DIAGNOSIS — K08109 Complete loss of teeth, unspecified cause, unspecified class: Secondary | ICD-10-CM

## 2015-09-14 DIAGNOSIS — K0820 Unspecified atrophy of edentulous alveolar ridge: Secondary | ICD-10-CM

## 2015-09-14 DIAGNOSIS — Z923 Personal history of irradiation: Secondary | ICD-10-CM

## 2015-09-14 DIAGNOSIS — C321 Malignant neoplasm of supraglottis: Secondary | ICD-10-CM

## 2015-09-14 DIAGNOSIS — Z463 Encounter for fitting and adjustment of dental prosthetic device: Secondary | ICD-10-CM

## 2015-09-14 NOTE — Progress Notes (Signed)
09/14/2015  Patient Name:   Kyle Alexander Date of Birth:   1961-07-26 Medical Record Number: [id number]  BP 120/71 mmHg  Pulse 88  Temp(Src) 98.5 F (36.9 C) (Oral)  Kyle Alexander presents for continued upper and lower denture fabrication.  Procedure:  Upper and lower denture wax tryin. Excellent maxillary set up and aesthetics. Patient accepts aesthetics. We discussed function of lower teeth. Patient is tending to protrude mandible to an end to end position. This is an unstable set up. We discussed dropping a lower premolar and moving the mandibular anterior teeth back to allow for better function and stability of the lower denture. We also discussed leaving the mandibular set up as is. Patient indicates that he wishes to proceed with reset of the lower teeth with dropping of the premolar and moving mandibular anterior teeth more posteriorly over the ridge. Patient to RTC for  upper and lower denture wax tryin -2.  Lenn Cal, DDS

## 2015-09-14 NOTE — Patient Instructions (Signed)
Return to clinic as scheduled for continued upper and lower complete denture fabrication. Dr. Kulinski 

## 2015-09-15 ENCOUNTER — Ambulatory Visit (HOSPITAL_COMMUNITY)
Admission: RE | Admit: 2015-09-15 | Discharge: 2015-09-15 | Disposition: A | Discharge status: Home / Self Care | Payer: Medicaid Other | Source: Ambulatory Visit | Attending: Radiation Oncology | Admitting: Radiation Oncology

## 2015-09-15 DIAGNOSIS — R1314 Dysphagia, pharyngoesophageal phase: Secondary | ICD-10-CM | POA: Diagnosis not present

## 2015-09-15 DIAGNOSIS — C321 Malignant neoplasm of supraglottis: Secondary | ICD-10-CM | POA: Diagnosis not present

## 2015-09-16 ENCOUNTER — Other Ambulatory Visit: Payer: Self-pay | Admitting: Radiation Oncology

## 2015-09-16 ENCOUNTER — Telehealth: Payer: Self-pay | Admitting: *Deleted

## 2015-09-16 DIAGNOSIS — C321 Malignant neoplasm of supraglottis: Secondary | ICD-10-CM

## 2015-09-16 NOTE — Telephone Encounter (Signed)
  Oncology Nurse Navigator Documentation   Navigator Encounter Type: Telephone (09/16/15 5883)         Interventions: Coordination of Care;Medication assitance (09/16/15 0910)     Patient called to report findings of yesterday's swallowing eval, including therapist guidance to wear Paci Muir valve when eating/drinking.  He expressed desire to reschedule PET as soon as possible.   He recalls that he took Ativan during RTs which helped manage his secretions.  He requested Rx from Dr. Isidore Moos so available for rescheduled PET. Information forwarded to Dr. Isidore Moos.  Gayleen Orem, RN, BSN, [facility] [place] [phone]              Time Spent with Patient: 15 (09/16/15 0910)

## 2015-09-18 ENCOUNTER — Telehealth: Payer: Self-pay | Admitting: *Deleted

## 2015-09-18 NOTE — Telephone Encounter (Signed)
  Oncology Nurse Navigator Documentation   Navigator Encounter Type: Telephone (09/18/15 1450)         Interventions: Other;Coordination of Care (09/18/15 1450)       Patient called:  I answered his question re cancellation of appt with Dr. Isidore Moos on Monday.  I confirmed his understanding of Monday appts with Dr. [name], [facility], and with Bridgett Larsson, at [facility].  He verbalized understanding.  He confirmed receipt of Ativan Rx for upcoming PET. Subsequent to our conversation, I called Radiology Scheduling, spoke with Vickii Chafe, asked her to call [facility] to reschedule PET, noting he needs an afternoon appt.  She verbalized understanding.  Gayleen Orem, RN, BSN, [place] [facility] [place]   Gayleen Orem, RN, BSN, [place] [facility] [place] [phone]           Time Spent with Patient: 15 (09/18/15 1450)

## 2015-09-21 ENCOUNTER — Ambulatory Visit: Payer: Medicaid Other | Attending: Radiation Oncology

## 2015-09-21 ENCOUNTER — Ambulatory Visit: Payer: Medicaid Other

## 2015-09-21 DIAGNOSIS — R1312 Dysphagia, oropharyngeal phase: Secondary | ICD-10-CM | POA: Insufficient documentation

## 2015-09-21 NOTE — Therapy (Addendum)
[facility] [address] [place], [place], [address] Phone: [phone]   Fax:  [phone]  Speech Language Pathology Treatment  Patient Details  Name: Kyle Alexander MRN: [id number] Date of Birth: November 02, 1961 Referring Provider:  [name], MD  Encounter Date: 09/21/2015      End of Session - 09/21/15 1539    Visit Number 2   Number of Visits 8  more visits due to cleared to eat POs, submental musculature possibly beginning fibrosis   Date for SLP Re-Evaluation 12/12/15   Authorization Type medicaid - awaiting auth   SLP Start Time 1450   SLP Stop Time  1540   SLP Time Calculation (min) 50 min   Activity Tolerance Patient tolerated treatment well      Past Medical History  Diagnosis Date  . Hypertension   . Cancer ([place]) 03/16/15    left cervical lymph node  . Shortness of breath dyspnea   . GERD (gastroesophageal reflux disease)   . Seizures ([place])   . S/P radiation therapy 04/16/2015-05/18/2015    Laryngopharynx and bilateral neck / 50 Gy in 20 fractions to gross disease, 45 Gy in 20 fractions to high risk nodal echelons     Past Surgical History  Procedure Laterality Date  . Knee surgery  1998    Left  . Tonsillectomy and adenoidectomy    . Lymph node biopsy Left 03/16/15    left cervical, consistent with squamous cell carcinoma  . Hernia repair      right groin  . Tonsillectomy    . Tracheostomy tube placement N/A 03/26/2015    Procedure: TRACHEOSTOMY ;  Surgeon: [name], MD;  Location: [facility];  Service: ENT;  Laterality: N/A;  . Tooth extraction  03/26/2015    Procedure: [place];  Surgeon: [name], MD;  Location: [facility];  Service: ENT;;  . Multiple extractions with alveoloplasty N/A 03/26/2015    Procedure: EXTRACTION OF TOOTH #'S 1,2,5,6,7,8,9,10,11,12,13,18,19,21,22,23,24,25,26,27,28,29  WITH AVELOPLASTY;  Surgeon: Lenn Cal, DDS;  Location: [facility];  Service: Oral Surgery;  Laterality:  N/A;  . Gastrostomy N/A 03/31/2015    Procedure: OPEN GASTROSTOMY TUBE PLACEMENT;  Surgeon: Rolm Bookbinder, MD;  Location: [facility];  Service: General;  Laterality: N/A;    There were no vitals filed for this visit.  Visit Diagnosis: Dysphagia, oropharyngeal - Plan: SLP plan of care cert/re-cert      Subjective Assessment - 09/21/15 1533    Subjective Pt not seen since June 2016. He had modified barium swallow (MBSS) last week recommending solids (Dys I, II, III) and thin liquids for pleasure, with precautions of small bites/sips, chew thoroughly, slow rate, alternate solid/liquid, multiple swallows to clear, and clear throat intermittently. Pt arrives today to obtain further treatment for moderate pharngeal and mild oral dysphagia after MBSS. "I'm gonna wear these (HEP) out now." (pt, after SLP told him of possible fibrosis in submental musculature)               ADULT SLP TREATMENT - 09/21/15 1505    General Information   Behavior/Cognition Alert;Cooperative;Pleasant mood   Treatment Provided   Treatment provided Dysphagia   Dysphagia Treatment   Temperature Spikes Noted No   Respiratory Status Trach  with PMV   Oral Cavity - Dentition Edentulous  recieves dentures tomorrow, reportedly   Treatment Methods Skilled observation;Therapeutic exercise   Patient observed directly with PO's No   Other treatment/comments What feels like min-mod fibrosis present in submental musculature. Pt  was reviewed about muscle fibrosis and risk of aspiration. Pt wearing Passy-Muir valve routinely. Pt c/o copious phlegm ever since last trach change. SLP reviewed HEP with pt, who states he completes HEP approx x2/week. SLP reiterated frequency and duration of HEP. SLP provided pt with Dysphagia diets handout and highlighted that Dys I may well be harder to clear than some other items, and encouraged lots of creamed soups and blended vegetable soups. Pt obtained a list of all precautions from SLP  completing MBSS last week and confirmed this by SLP reading off noted precautions including oral care before and after POs. SLP reviewed HEP with pt with min A req'd occasionally.    Pain Assessment   Pain Assessment No/denies pain   Assessment / Recommendations / Plan   Plan Continue with current plan of care   Dysphagia Recommendations   Diet recommendations Dysphagia 1 (puree);Dysphagia 2 (fine chop);Dysphagia 3 (mechanical soft);Thin liquid   Liquids provided via Cup   Compensations Slow rate;Small sips/bites;Multiple dry swallows after each bite/sip;Follow solids with liquid;Clear throat intermittently;Effortful swallow   Postural Changes and/or Swallow Maneuvers Seated upright 90 degrees   General Recommendations   Oral Care Recommendations Oral care before and after PO   Progression Toward Goals   Progression toward goals Progressing toward goals   General Information   Reason PO's not observed --  did not perform oral care prior to [place] visit          SLP Education - 09/21/15 1538    Education provided Yes   Education Details HEP, swallow precautions, oral care, muscle fibrosis, easiest types of foods to eat   Person(s) Educated Patient   Methods Explanation;Demonstration;Handout;Verbal cues   Comprehension Verbalized understanding;Returned demonstration;Verbal cues required          SLP Short Term Goals - 09/21/15 1547    SLP SHORT TERM GOAL #1   Title pt will demo dysphagia HEP with rare min cues, in order to improve swallowing ability/decr aspiration risk   Baseline occasional min cues   Time 4   Period --  visits   Status New   SLP SHORT TERM GOAL #2   Title pt will demo swallowing precautions to decr aspiration risk with POs   Baseline not observed today due to not completing oral care   Time 4   Period --  visits   Status New          SLP Long Term Goals - 09/21/15 1544    SLP LONG TERM GOAL #1   Title pt to demo HEP for dysphagia to incr ability to  swallow POs safely/decr risk of aspiration with independence   Baseline occasional verbal and demo cues   Time 8   Period --  visits   Status Revised   SLP LONG TERM GOAL #2   Title pt will tell SLP signs/symptoms aspiration PNA with rare verbal cues   Baseline total A   Time 8   Period --  visits   Status On-going   SLP LONG TERM GOAL #3   Title pt will tell SLP why he is completing HEP    Baseline total A   Time 8   Period --  visits   Status On-going          Plan - 09/21/15 1539    Clinical Impression Statement Pt requires skilled ST for treatment for mild oral and moderate pharyngeal dysphagia (ICD-10: R13.12) following radiation tx for supraglottic head and neck CA (ICD-10: C32.1).  Pt with possible muscle fibrosis beginning in submental area. MBSS last week recommended follow up speech therapy (see "subjective" and "other treatment comments"). Pt will need to be followed by SLP to cont to assess proper procedure with HEP and to coninue to assess safety with PO intake.   Speech Therapy Frequency 1x /week   Duration --  8 weeks requested, may need to modify based upon pt's fiinancial needs   Treatment/Interventions Aspiration precaution training;Pharyngeal strengthening exercises;Oral motor exercises;Patient/family education;Compensatory strategies;SLP instruction and feedback   Potential to Achieve Goals Good   Potential Considerations Medical prognosis;Severity of impairments        Problem List Patient Active Problem List   Diagnosis Date Noted  . Tracheostomy status ([place])   . 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 Hypomagnesemia   . Tracheostomy care ([place])   . Head and neck cancer ([place]) 03/27/2015  . 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 Vitamin D deficiency 03/17/2015  . Folate deficiency 03/16/2015  . Protein-calorie malnutrition, severe ([place]) 03/16/2015  . HTN (hypertension) 03/15/2015  . Tobacco abuse 03/15/2015  . Alcohol abuse 03/15/2015   . Alcohol withdrawal seizure ([place]) 03/15/2015  . Prolonged Q-T interval on ECG 03/15/2015  . Aortic dilatation ([place]) 03/15/2015  . Thyroid nodule 03/15/2015  . Lymphadenopathy 03/15/2015  . Hypokalemia 03/15/2015  . B12 deficiency 03/15/2015  . Marijuana abuse 03/15/2015    [facility] , [place], [place]  09/21/2015, 4:28 PM  [facility] [address] [place], [place], [address] Phone: [phone]   Fax:  [phone]

## 2015-09-21 NOTE — Patient Instructions (Signed)
Complete exercises as prescribed x2-3/day, 6-7 days per week Follow all the precautions Tammy gave you last week on the orange sheet when you try food/liquids for pleasure Remember thicker foods will have more difficulty passing through your throat Creamed soups, and pureed veggies with beef/chicken broth (to thin them down)

## 2015-09-22 ENCOUNTER — Encounter (HOSPITAL_COMMUNITY): Payer: Self-pay | Admitting: Dentistry

## 2015-09-22 ENCOUNTER — Ambulatory Visit (HOSPITAL_COMMUNITY): Payer: Medicaid - Dental | Admitting: Dentistry

## 2015-09-22 VITALS — BP 125/88 | HR 87 | Temp 98.1°F

## 2015-09-22 DIAGNOSIS — K08109 Complete loss of teeth, unspecified cause, unspecified class: Secondary | ICD-10-CM

## 2015-09-22 DIAGNOSIS — Z923 Personal history of irradiation: Secondary | ICD-10-CM

## 2015-09-22 DIAGNOSIS — C321 Malignant neoplasm of supraglottis: Secondary | ICD-10-CM

## 2015-09-22 DIAGNOSIS — K0820 Unspecified atrophy of edentulous alveolar ridge: Secondary | ICD-10-CM

## 2015-09-22 DIAGNOSIS — Z463 Encounter for fitting and adjustment of dental prosthetic device: Secondary | ICD-10-CM

## 2015-09-22 NOTE — Patient Instructions (Signed)
Return to clinic as scheduled for continued upper and lower complete denture fabrication. Dr. Varina Hulon 

## 2015-09-22 NOTE — Progress Notes (Signed)
09/22/2015  Patient Name:   Kyle Alexander Date of Birth:   1961-09-10 Medical Record Number: [id number]  BP 125/88 mmHg  Pulse 87  Temp(Src) 98.1 F (36.7 C) (Oral)   [name] presents for continued upper and lower denture fabrication.  Procedure:  Upper and lower denture wax tryin-2. Patient accepts the set up with dropping of the lower premolars and using larger mandibular anterior teeth as needed.  Patient accepts esthetics, phonetics, fit and function. Patient agrees to process "as is" in 50:50 [place] 199. Patient to RTC for  upper and lower denture insertion.  Lenn Cal, DDS

## 2015-09-30 ENCOUNTER — Encounter (HOSPITAL_COMMUNITY): Payer: Self-pay | Admitting: Dentistry

## 2015-09-30 ENCOUNTER — Ambulatory Visit (HOSPITAL_COMMUNITY): Payer: Medicaid - Dental | Admitting: Dentistry

## 2015-09-30 VITALS — BP 126/84 | HR 85 | Temp 99.2°F

## 2015-09-30 DIAGNOSIS — Z463 Encounter for fitting and adjustment of dental prosthetic device: Secondary | ICD-10-CM

## 2015-09-30 DIAGNOSIS — K08109 Complete loss of teeth, unspecified cause, unspecified class: Secondary | ICD-10-CM

## 2015-09-30 DIAGNOSIS — C321 Malignant neoplasm of supraglottis: Secondary | ICD-10-CM

## 2015-09-30 DIAGNOSIS — Z923 Personal history of irradiation: Secondary | ICD-10-CM

## 2015-09-30 DIAGNOSIS — K0820 Unspecified atrophy of edentulous alveolar ridge: Secondary | ICD-10-CM

## 2015-09-30 NOTE — Progress Notes (Signed)
09/30/2015  Patient Name:   Kyle Alexander Date of Birth:   1961-02-13 Medical Record Number: [id number]  BP 126/84 mmHg  Pulse 85  Temp(Src) 99.2 F (37.3 C) (Oral)  [name] presents for insertion of upper and lower complete dentures.  Procedure: Pressure indicating paste was applied to the dentures. Adjustments were made as needed. [place] Occlusion evaluated and adjustments made as needed for Centric Relation and protrusive strokes. Good esthetics, phonetics, fit, and function noted. Patient accepts results. Post op instructions provided in written and verbal formats on use and care of dentures. Gave patient denture brush and cup. Patient to keep dentures out if sore spots develop. Use salt water rinses as needed to aid healing. Return to clinic as scheduled for denture adjustment.   Call if problems arise before then.  Lenn Cal, DDS

## 2015-09-30 NOTE — Patient Instructions (Signed)
Instructions for Denture Use and Care  Congratulations, you are on the way to oral rehabilitation!  You have just received a new set of complete or partial dentures.  These prostheses will help to improve both your appearance and chewing ability.  These instructions will help you get adjusted to your dentures as well as care for them properly.  Please read these instructions carefully and completely as soon as you get home.  If you or your caregiver have any questions please notify the [facility] at [phone].  HOW YOUR DENTURES LOOK AND FEEL Soon after you begin wearing your dentures, you may feel that your dentures are too large or even loose.  As our mouth and facial muscles become accustomed to the dentures, these feelings will go away.  You also may feel that you are salivating more than you normally do.  This feeling should go away as you get used to having the dentures in your mouth.  You may bite your cheek or your tongue; this will eventually resolve itself as you wear your dentures.  Some soreness is to be expected, but you should not hurt.  If your mouth hurts, call your dentist.  A denture adhesive may occasionally be necessary to hold your dentures in place more securely.  The dentist will let you know when one is recommended for you.  SPEAKING Wearing dentures will change the sound of your voice initially.  This will be noticed by you more than anyone else.  Bite and swallow before you speak, in order to place your dentures in position so that you may speak more clearly.  Practice speaking by reading aloud or counting from 1 to 100 very slowly and distinctly.  After some practice your mouth will become accustomed to your dentures and you will speak more clearly.  EATING Chewing will definitely be different after you receive your dentures.  With a little practice and patience you should be able to eat just about any kind of food.  Begin by eating small quantities of food  that are cut into small pieces.  Star with soft foods such as eggs, cooked vegetables, or puddings.  As you gain confidence advance  Your diet to whatever texture foods you can tolerate.  DENTURE CARE Dentures can collect plaque and calculus much the same as natural teeth can.  If not removed on a regular basis, your dentures will not look or feel clean, and you will experience denture odor.  It is very important that you remove your dentures at bedtime and clean them thoroughly.  You should: 1. Clean your dentures over a sink full of water so if dropped, breakage will be prevented. 2. Rinse your dentures with cool water to remove any large food particles. 3. Use soap and water or a denture cleanser or paste to clean the dentures.  Do not use regular toothpaste as it may abrade the denture base or teeth. 4. Use a moistened denture brush to clean all surfaces (inside and outside). 5. Rinse thoroughly to remove any remaining soap or denture cleanser. 6. Use a soft bristle toothbrush to gently brush any natural teeth, gums, tongue, and palate at bedtime and before reinserting your dentures. 7. Do not sleep with your dentures in your mouth at night.  Remove your dentures and soak them overnight in a denture cup filled with water or denture solution as recommended by your dentist.  This routine will become second nature and will increase the life and comfort   of your dentures.  Please do not try to adjust these dentures yourself; you could damage them.  FOLLOW-UP You should call or make an appointment with your dentist.  Your dentist would like to see you at least once a year for a check-up and examination. 

## 2015-10-01 ENCOUNTER — Ambulatory Visit (HOSPITAL_COMMUNITY)
Admission: RE | Admit: 2015-10-01 | Discharge: 2015-10-01 | Disposition: A | Discharge status: Home / Self Care | Payer: Medicaid Other | Source: Ambulatory Visit | Attending: Radiation Oncology | Admitting: Radiation Oncology

## 2015-10-01 DIAGNOSIS — I700 Atherosclerosis of aorta: Secondary | ICD-10-CM | POA: Diagnosis not present

## 2015-10-01 DIAGNOSIS — K8020 Calculus of gallbladder without cholecystitis without obstruction: Secondary | ICD-10-CM | POA: Insufficient documentation

## 2015-10-01 DIAGNOSIS — C770 Secondary and unspecified malignant neoplasm of lymph nodes of head, face and neck: Secondary | ICD-10-CM | POA: Insufficient documentation

## 2015-10-01 DIAGNOSIS — E041 Nontoxic single thyroid nodule: Secondary | ICD-10-CM | POA: Insufficient documentation

## 2015-10-01 DIAGNOSIS — Z923 Personal history of irradiation: Secondary | ICD-10-CM | POA: Diagnosis not present

## 2015-10-01 DIAGNOSIS — C321 Malignant neoplasm of supraglottis: Secondary | ICD-10-CM | POA: Diagnosis not present

## 2015-10-01 DIAGNOSIS — Z930 Tracheostomy status: Secondary | ICD-10-CM | POA: Diagnosis not present

## 2015-10-01 DIAGNOSIS — Z931 Gastrostomy status: Secondary | ICD-10-CM | POA: Diagnosis not present

## 2015-10-01 LAB — GLUCOSE, CAPILLARY: GLUCOSE-CAPILLARY: 84 mg/dL (ref 65–99)

## 2015-10-01 MED ORDER — FLUDEOXYGLUCOSE F - 18 (FDG) INJECTION
7.1000 | Freq: Once | INTRAVENOUS | Status: DC | PRN
  Administered 2015-10-01: 7.1 via INTRAVENOUS
  Filled 2015-10-01: qty 7.1

## 2015-10-02 ENCOUNTER — Other Ambulatory Visit: Payer: Self-pay | Admitting: Radiation Oncology

## 2015-10-02 DIAGNOSIS — E038 Other specified hypothyroidism: Secondary | ICD-10-CM

## 2015-10-02 DIAGNOSIS — C321 Malignant neoplasm of supraglottis: Secondary | ICD-10-CM

## 2015-10-02 MED ORDER — LEVOTHYROXINE SODIUM 25 MCG PO TABS: 25.0000 ug | ORAL_TABLET | Freq: Every day | ORAL | Status: DC

## 2015-10-02 NOTE — Progress Notes (Signed)
I called pt and discussed his PET results.   Appears to have a partial response to RT, no new metastases.  I am not sure if surgery is a realistic option for him or if systemic therapy is prudent.  He is open to anything that is recommended. Will add him to tumor board for discussion.  He is trying to schedule an appt with Dr [name].  I advised him to call Dr. Jamelle Haring office next week if he still hasn't heard back from them.  -----------------------------------  [name], MD

## 2015-10-06 ENCOUNTER — Encounter (HOSPITAL_COMMUNITY): Payer: Self-pay | Admitting: Dentistry

## 2015-10-06 ENCOUNTER — Telehealth: Payer: Self-pay | Admitting: *Deleted

## 2015-10-06 NOTE — Telephone Encounter (Signed)
CALLED PATIENT TO INFORM OF LAB AND FU VISIT ON 12-02-15, LAB @ 11 AM AND HIS FU VISIT @ 59;13 AM, SPOKE WITH PATIENT AND HE IS AWARE OF THESE APPTS.

## 2015-10-12 ENCOUNTER — Ambulatory Visit: Payer: Medicaid Other

## 2015-10-12 ENCOUNTER — Ambulatory Visit (HOSPITAL_COMMUNITY): Payer: Medicaid - Dental | Admitting: Dentistry

## 2015-10-12 ENCOUNTER — Encounter (HOSPITAL_COMMUNITY): Payer: Self-pay | Admitting: Dentistry

## 2015-10-12 VITALS — BP 129/90 | HR 92 | Temp 98.9°F

## 2015-10-12 DIAGNOSIS — R1312 Dysphagia, oropharyngeal phase: Secondary | ICD-10-CM | POA: Diagnosis not present

## 2015-10-12 DIAGNOSIS — Z463 Encounter for fitting and adjustment of dental prosthetic device: Secondary | ICD-10-CM

## 2015-10-12 DIAGNOSIS — C321 Malignant neoplasm of supraglottis: Secondary | ICD-10-CM

## 2015-10-12 DIAGNOSIS — K0820 Unspecified atrophy of edentulous alveolar ridge: Secondary | ICD-10-CM

## 2015-10-12 DIAGNOSIS — Z923 Personal history of irradiation: Secondary | ICD-10-CM

## 2015-10-12 DIAGNOSIS — K08109 Complete loss of teeth, unspecified cause, unspecified class: Secondary | ICD-10-CM

## 2015-10-12 NOTE — Patient Instructions (Signed)
Creamed soups, or soups with homogenous consistency due to no chewing right now. Wet mashed potatoes, wet scrambled eggs may be all right as well.  Recall that thicker foods such as applesauce may be harder to get through your throat at this time.

## 2015-10-12 NOTE — Progress Notes (Signed)
10/12/2015  Patient Name:   MELQUISEDEC JOURNEY Date of Birth:   [birth year] Medical Record Number: [id number]  BP 129/90 mmHg  Pulse 92  Temp(Src) 98.9 F (37.2 C) (Oral)  [name] presents for evaluation of recently inserted upper and lower complete dentures. SUBJECTIVE: Patient with minimal complaints from the dentures. Patient has not been able to wear the dentures for a long time due to gagging problem. OBJECTIVE: There is no sign of denture irritation or erythema. Procedure: Pressure indicating paste was applied to the dentures. Adjustments were made as needed. [place]). Thick PIP applied to denture borders. Adjustments made as needed. [place] ([place]). Occlusion evaluated and adjustments made as needed for Centric Relation and protrusive strokes. Patient accepts results. Patient to keep dentures out if sore spots develop. Use salt water rinses as needed to aid healing. Return to clinic as scheduled for denture adjustment.   Call if problems arise before then.  Lenn Cal, DDS

## 2015-10-12 NOTE — Patient Instructions (Signed)
Patient to keep dentures out if sore spots develop. Use salt water rinses as needed to aid healing. Return to clinic as scheduled for denture adjustment.   Call if problems arise before then.  Leroy Trim F. Johari Bennetts, DDS  

## 2015-10-12 NOTE — Therapy (Signed)
[facility] [address] [place], [place], [address] Phone: [phone]   Fax:  [phone]  Speech Language Pathology Treatment  Patient Details  Name: Kyle Alexander MRN: [id number] Date of Birth: 12-01-1961 No Data Recorded  Encounter Date: 10/12/2015      End of Session - 10/12/15 1601    Visit Number 3   Number of Visits 8   Date for SLP Re-Evaluation 12/12/15   Authorization Time Period 10-01-15 to 11-25-15   Authorization - Visit Number 3   Authorization - Number of Visits 1   SLP Start Time 5027   SLP Stop Time  1537   SLP Time Calculation (min) 43 min   Activity Tolerance Patient tolerated treatment well      Past Medical History  Diagnosis Date  . Hypertension   . Cancer ([place]) 03/16/15    left cervical lymph node  . Shortness of breath dyspnea   . GERD (gastroesophageal reflux disease)   . Seizures ([place])   . S/P radiation therapy 04/16/2015-05/18/2015    Laryngopharynx and bilateral neck / 50 Gy in 20 fractions to gross disease, 45 Gy in 20 fractions to high risk nodal echelons     Past Surgical History  Procedure Laterality Date  . Knee surgery  1998    Left  . Tonsillectomy and adenoidectomy    . Lymph node biopsy Left 03/16/15    left cervical, consistent with squamous cell carcinoma  . Hernia repair      right groin  . Tonsillectomy    . Tracheostomy tube placement N/A 03/26/2015    Procedure: TRACHEOSTOMY ;  Surgeon: [name], MD;  Location: [facility];  Service: ENT;  Laterality: N/A;  . Tooth extraction  03/26/2015    Procedure: [place];  Surgeon: [name], MD;  Location: [facility];  Service: ENT;;  . Multiple extractions with alveoloplasty N/A 03/26/2015    Procedure: EXTRACTION OF TOOTH #'S 1,2,5,6,7,8,9,10,11,12,13,18,19,21,22,23,24,25,26,27,28,29  WITH AVELOPLASTY;  Surgeon: Lenn Cal, DDS;  Location: [facility];  Service: Oral Surgery;  Laterality: N/A;  . Gastrostomy N/A 03/31/2015   Procedure: OPEN GASTROSTOMY TUBE PLACEMENT;  Surgeon: Rolm Bookbinder, MD;  Location: [facility];  Service: General;  Laterality: N/A;    There were no vitals filed for this visit.  Visit Diagnosis: Dysphagia, oropharyngeal      Subjective Assessment - 10/12/15 1507    Subjective Pt c/o esophageal reflux. Pt has been completing HEP 1-2 times/day. Has not tried any POs for what he reports as a fear of choking.               ADULT SLP TREATMENT - 10/12/15 1508    General Information   Behavior/Cognition Alert;Cooperative;Pleasant mood   Treatment Provided   Treatment provided Dysphagia   Dysphagia Treatment   Temperature Spikes Noted Yes  low grade last fever last week, WNL this week   Respiratory Status Trach  PMSV   Oral Cavity - Dentition Edentulous   Treatment Methods Skilled observation;Therapeutic exercise   Patient observed directly with PO's Yes   Type of PO's observed Nectar-thick liquids;Thin liquids  nectar thick = tomato soup   Feeding Able to feed self   Liquids provided via Cup   Oral Phase Signs & Symptoms --  no overt s/s   Pharyngeal Phase Signs & Symptoms --  no overt s/s aspiration    Type of cueing Verbal   Amount of cueing Minimal  rare   Other treatment/comments Oral care  reportedly performed <10 minutes before tx. Pt with what appears to be mod fibrosis of submental musculature extending out from there nearing mandible. Pt was reminded of the need to complete HEP as directed at least twice a day. He req'd min A rarely with HEP (chin pushback with open mouth). He asked SLP about foods to eat and SLP suggested soups and other high nutrtion liquids as pt unable to chew anything at this time due to dentures unable to be worn for extended period of time.    Pain Assessment   Pain Assessment 0-10   Pain Score 0-No pain   Assessment / Recommendations / Plan   Plan Continue with current plan of care   Dysphagia Recommendations   Diet recommendations Thin  liquid;Nectar-thick liquid  solids as tolerated given pt endentulous   Liquids provided via Cup   Compensations Slow rate;Small sips/bites;Multiple dry swallows after each bite/sip;Follow solids with liquid;Clear throat intermittently;Effortful swallow   Postural Changes and/or Swallow Maneuvers Seated upright 90 degrees   General Recommendations   Oral Care Recommendations Oral care before and after PO   Progression Toward Goals   Progression toward goals Progressing toward goals          SLP Education - 10/12/15 1601    Education provided Yes   Education Details types of foods to eat, swallow precautions, oral care, muscle fibrosis, HEP   Person(s) Educated Patient   Methods Explanation;Demonstration;Verbal cues   Comprehension Verbalized understanding;Returned demonstration;Verbal cues required          SLP Short Term Goals - 10/12/15 1604    SLP SHORT TERM GOAL #1   Title pt will demo dysphagia HEP with rare min cues, in order to improve swallowing ability/decr aspiration risk   Baseline occasional min cues   Status Achieved   SLP SHORT TERM GOAL #2   Title pt will demo swallowing precautions to decr aspiration risk with POs   Baseline not observed today due to not completing oral care   Time 4   Period --  visits   Status On-going          SLP Long Term Goals - 10/12/15 1605    SLP LONG TERM GOAL #1   Title pt to demo HEP for dysphagia to incr ability to swallow POs safely/decr risk of aspiration with independence   Baseline occasional verbal and demo cues   Time 8   Period --  visits   Status Revised   SLP LONG TERM GOAL #2   Title pt will tell SLP signs/symptoms aspiration PNA with rare verbal cues   Baseline total A   Time 8   Period --  visits   Status On-going   SLP LONG TERM GOAL #3   Title pt will tell SLP why he is completing HEP    Baseline total A   Time 8   Period --  visits   Status On-going          Plan - 10/12/15 1603     Clinical Impression Statement Pt requires skilled ST for treatment for mild oral and moderate pharyngeal dysphagia (ICD-10: R13.12) following radiation tx for supraglottic head and neck CA (ICD-10: C32.1) Pt will need to be followed by SLP to assess proper procedure with HEP and to assess safety with PO intake. Pt's performance with HEP was much improved over last session.    Speech Therapy Frequency 1x /week   Duration --  7 more visits   Treatment/Interventions Aspiration precaution  training;Pharyngeal strengthening exercises;Oral motor exercises;Patient/family education;Compensatory strategies;SLP instruction and feedback   Potential to Achieve Goals Good   Potential Considerations Medical prognosis;Severity of impairments        Problem List Patient Active Problem List   Diagnosis Date Noted  . Tracheostomy status ([place])   . Pharyngeal cancer ([place])   . Hypomagnesemia   . Tracheostomy care ([place])   . Head and neck cancer ([facility]) 03/27/2015  . 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 Vitamin D deficiency 03/17/2015  . Folate deficiency 03/16/2015  . Protein-calorie malnutrition, severe ([place]) 03/16/2015  . HTN (hypertension) 03/15/2015  . Tobacco abuse 03/15/2015  . Alcohol abuse 03/15/2015  . Alcohol withdrawal seizure ([place]) 03/15/2015  . Prolonged Q-T interval on ECG 03/15/2015  . Aortic dilatation ([place]) 03/15/2015  . Thyroid nodule 03/15/2015  . Lymphadenopathy 03/15/2015  . Hypokalemia 03/15/2015  . B12 deficiency 03/15/2015  . Marijuana abuse 03/15/2015    [facility] , MS, CCC-SLP  10/12/2015, 4:05 PM  [facility] [address] [place], [place], [address] Phone: [phone]   Fax:  [phone]   Name: Kyle Alexander MRN: [id number] Date of Birth: 1961/07/16

## 2015-10-14 ENCOUNTER — Telehealth: Payer: Self-pay | Admitting: *Deleted

## 2015-10-14 ENCOUNTER — Telehealth: Payer: Self-pay | Admitting: Hematology and Oncology

## 2015-10-14 NOTE — Telephone Encounter (Signed)
  Oncology Nurse Navigator Documentation   Navigator Encounter Type: Telephone (10/14/15 8938)         Interventions: Coordination of Care (10/14/15 1017)     Called patient:  Per Dr. Isidore Moos, briefed him on discussion at H&N Conference this morning, inform him of chemotherapy recommendation and pending appt scheduled for next Wed 12:30 to see Dr. Alvy Bimler.   Provided phone # for [place] with guidance to call and arrange for PCP. He verbalized understanding of information provided.  Gayleen Orem, RN, BSN, [place] at [place] [phone]            Time Spent with Patient: 15 (10/14/15 8242)

## 2015-10-14 NOTE — Telephone Encounter (Signed)
Called pt.

## 2015-10-14 NOTE — Telephone Encounter (Signed)
S/W PT IN REF TO NP APPT ON 10/21/15 AT 12:00

## 2015-10-16 ENCOUNTER — Telehealth: Payer: Self-pay | Admitting: Hematology and Oncology

## 2015-10-16 NOTE — Telephone Encounter (Signed)
Pt away of np appt. 10/19/15@1 :30

## 2015-10-19 ENCOUNTER — Telehealth: Payer: Self-pay | Admitting: Hematology and Oncology

## 2015-10-19 ENCOUNTER — Ambulatory Visit: Payer: Self-pay

## 2015-10-19 ENCOUNTER — Ambulatory Visit (HOSPITAL_BASED_OUTPATIENT_CLINIC_OR_DEPARTMENT_OTHER): Payer: Medicaid Other | Admitting: Hematology and Oncology

## 2015-10-19 ENCOUNTER — Encounter: Payer: Self-pay | Admitting: Hematology and Oncology

## 2015-10-19 ENCOUNTER — Encounter: Payer: Self-pay | Admitting: *Deleted

## 2015-10-19 ENCOUNTER — Telehealth: Payer: Self-pay | Admitting: *Deleted

## 2015-10-19 VITALS — BP 117/77 | HR 82 | Temp 98.4°F | Resp 20 | Ht 70.0 in | Wt 152.6 lb

## 2015-10-19 DIAGNOSIS — Z23 Encounter for immunization: Secondary | ICD-10-CM

## 2015-10-19 DIAGNOSIS — R12 Heartburn: Secondary | ICD-10-CM

## 2015-10-19 DIAGNOSIS — C321 Malignant neoplasm of supraglottis: Secondary | ICD-10-CM | POA: Diagnosis present

## 2015-10-19 DIAGNOSIS — Z930 Tracheostomy status: Secondary | ICD-10-CM | POA: Diagnosis not present

## 2015-10-19 DIAGNOSIS — Z720 Tobacco use: Secondary | ICD-10-CM

## 2015-10-19 DIAGNOSIS — K5900 Constipation, unspecified: Secondary | ICD-10-CM

## 2015-10-19 DIAGNOSIS — E43 Unspecified severe protein-calorie malnutrition: Secondary | ICD-10-CM | POA: Diagnosis not present

## 2015-10-19 DIAGNOSIS — Z931 Gastrostomy status: Secondary | ICD-10-CM | POA: Diagnosis not present

## 2015-10-19 HISTORY — DX: Heartburn: R12

## 2015-10-19 MED ORDER — POLYETHYLENE GLYCOL 3350 17 G PO PACK: 17.0000 g | PACK | Freq: Every day | ORAL | Status: DC

## 2015-10-19 MED ORDER — METOCLOPRAMIDE HCL 5 MG/5ML PO SOLN: 5.0000 mg | Freq: Three times a day (TID) | ORAL | Status: DC

## 2015-10-19 MED ORDER — INFLUENZA VAC SPLIT QUAD 0.5 ML IM SUSY
0.5000 mL | PREFILLED_SYRINGE | Freq: Once | INTRAMUSCULAR | Status: AC
  Administered 2015-10-19: 0.5 mL via INTRAMUSCULAR
  Filled 2015-10-19: qty 0.5

## 2015-10-19 MED ORDER — PNEUMOCOCCAL 13-VAL CONJ VACC IM SUSP
0.5000 mL | Freq: Once | INTRAMUSCULAR | Status: AC
  Administered 2015-10-19: 0.5 mL via INTRAMUSCULAR
  Filled 2015-10-19: qty 0.5

## 2015-10-19 NOTE — Progress Notes (Signed)
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[place])   03/15/2015 - 03/17/2015 Hospital Admission He was admitted to the hospital for evaluation of dysphagia, SOB, hemoptosis, hoarseness, 30-40 pound weight loss and worsening bilateral neck masses for 5 months   03/15/2015 Imaging Ct showed extensive circumferential malignancy in the hypopharyngeal/supraglottic region with regional LN metastases   03/16/2015 Procedure He underwent ULTRASOUND-GUIDED BIOPSY OF LEFT CERVICAL LYMPH NODES   03/16/2015 Pathology Results Accession: [id number] LN biopsy showed invasive squamous cell cancer   03/16/2015 Pathology Results Accession: [id number] showed atypical squamous cells   03/25/2015 - 04/07/2015 Hospital Admission He was admitted to the hospital and underwent tracheostomy placement, feeeding tube placement but subsequently left [facility]   03/26/2015 Surgery He had multiple extraction of tooth numbers 1, 2, 5, 6, 7, 8, 9, 10, 11, 12, 13, 18, 19, 21, 22, 23, 24, 25, 26, 27, 28, and 29. and 4 Quadrants of alveoloplasty   03/26/2015 Surgery He underwent tracheostomy   03/31/2015 Surgery He had open gastrostomy tube placement by Dr. Donne Hazel   04/16/2015 - 05/18/2015 Radiation Therapy Laryngopharynx and bilateral neck / 50 Gy in 20 fractions to gross disease, 45 Gy in 20 fractions to high risk nodal echelons  Beams/energy: Helical IMRT / 6 MV photons   04/16/2015 Procedure Fluoroscopic reposition of the 51 French gastrostomy confirmed back in the stomach,   07/03/2015 Procedure IR performed replacement of gastrostomy tube with a new 72 French balloon retention tube   10/01/2015 Imaging PEt scan showed persistent hypermetabolism within the primary  supraglottic laryngeal tumor and within right retropharyngeal, bilateral level II and left level IV cervical nodal metastases    INTERVAL HISTORY: Please see below for problem oriented charting. He returns for further follow-up. He has some pain in his throat but declined pain medicine. He manages his feeding well and tolerated 8 cans of nutritional supplement per day. He complained of heartburn and mild constipation.  He continues to smoke and drink sporadically.  REVIEW OF SYSTEMS:   Constitutional: Denies fevers, chills or abnormal weight loss Eyes: Denies blurriness of vision Respiratory: Denies cough, dyspnea or wheezes Cardiovascular: Denies palpitation, chest discomfort or lower extremity swelling Skin: Denies abnormal skin rashes Lymphatics: Denies new lymphadenopathy or easy bruising Neurological:Denies numbness, tingling or new weaknesses Behavioral/Psych: Mood is stable, no new changes  All other systems were reviewed with the patient and are negative.  I have reviewed the past medical history, past surgical history, social history and family history with the patient and they are unchanged from previous note.  ALLERGIES:  is allergic to aspirin.  MEDICATIONS:  Current Outpatient Prescriptions  Medication Sig Dispense Refill  . emollient (BIAFINE) cream Apply 1 application topically 2 (two) times daily.    Marland Kitchen ibuprofen (ADVIL,MOTRIN) 200 MG tablet Take 200 mg by mouth every 6 (six) hours as needed for mild pain.    Marland Kitchen levothyroxine (LEVOTHROID) 25 MCG tablet Take 1 tablet (25 mcg total) by mouth daily before breakfast. 30 tablet 5  . LORazepam (ATIVAN) 0.5 MG tablet Take 1-2 tablets 30 min before radiotherapy for anxiety. (Patient not taking: Reported on 05/04/2015) 25 tablet 0  . metoCLOPramide (REGLAN) 5 MG/5ML solution Place 5 mLs (5 mg total) into feeding tube 4 (  four) times daily -  before meals and at bedtime. 120 mL 0  . nicotine (NICODERM CQ) 7 mg/24hr patch apply 7  mg patch daily x 4 wks after quitting smoking 14 patch 1  . Nutritional Supplements (FEEDING SUPPLEMENT, OSMOLITE 1.2 CAL,) LIQD Give 2 cans osmolite 1.2 QID with 60 cc free water before and after bolus feeding.  Give an additional 240 cc free water via PEG daily. 1896 mL 0  . polyethylene glycol (MIRALAX) packet Take 17 g by mouth daily. 14 each 0   No current facility-administered medications for this visit.    PHYSICAL EXAMINATION: ECOG PERFORMANCE STATUS: 1 - Symptomatic but completely ambulatory  Filed Vitals:   10/19/15 1339  BP: 117/77  Pulse: 82  Temp: 98.4 F (36.9 C)  Resp: 20   Filed Weights   10/19/15 1339  Weight: 152 lb 9.6 oz (69.219 kg)    GENERAL:alert, no distress and comfortable SKIN: skin color, texture, turgor are normal, no rashes or significant lesions EYES: normal, Conjunctiva are pink and non-injected, sclera clear OROPHARYNX:no exudate, no erythema and lips, buccal mucosa, and tongue normal  NECK:  Neck is thick and fibrosis from prior radiation. Tracheostomy in situ LYMPH:  no palpable lymphadenopathy in the cervical, axillary or inguinal LUNGS: clear to auscultation and percussion with normal breathing effort HEART: regular rate & rhythm and no murmurs and no lower extremity edema ABDOMEN:abdomen soft, non-tender and normal bowel sounds. Feeding tube site looks okay Musculoskeletal:no cyanosis of digits and no clubbing  NEURO: alert & oriented x 3 with fluent speech, no focal motor/sensory deficits  LABORATORY DATA:  I have reviewed the data as listed    Component Value Date/Time   NA 138 04/04/2015 0408   K 3.8 04/04/2015 0408   CL 97 04/04/2015 0408   CO2 34* 04/04/2015 0408   GLUCOSE 137* 04/04/2015 0408   BUN 6 04/04/2015 0408   CREATININE 0.46* 04/04/2015 0408   CALCIUM 9.2 04/04/2015 0408   PROT 6.4 03/27/2015 0256   ALBUMIN 2.4* 03/27/2015 0256   AST 17 03/27/2015 0256   ALT 9 03/27/2015 0256   ALKPHOS 59 03/27/2015 0256   BILITOT  1.2 03/27/2015 0256   GFRNONAA >90 04/04/2015 0408   GFRAA >90 04/04/2015 0408    No results found for: SPEP, UPEP  Lab Results  Component Value Date   WBC 8.5 04/03/2015   NEUTROABS 11.9* 03/27/2015   HGB 9.8* 04/03/2015   HCT 29.5* 04/03/2015   MCV 98.0 04/03/2015   PLT 294 04/03/2015      Chemistry      Component Value Date/Time   NA 138 04/04/2015 0408   K 3.8 04/04/2015 0408   CL 97 04/04/2015 0408   CO2 34* 04/04/2015 0408   BUN 6 04/04/2015 0408   CREATININE 0.46* 04/04/2015 0408      Component Value Date/Time   CALCIUM 9.2 04/04/2015 0408   ALKPHOS 59 03/27/2015 0256   AST 17 03/27/2015 0256   ALT 9 03/27/2015 0256   BILITOT 1.2 03/27/2015 0256       RADIOGRAPHIC STUDIES: I reviewed the imaging study with the patient I have personally reviewed the radiological images as listed and agreed with the findings in the report.    ASSESSMENT & PLAN:  Squamous cell carcinoma of supraglottis  We have discussed his case at the most recent ENT tumor board. The patient have persistent disease that is not amenable for surgery or further radiation. The patient understood treatment is strictly palliative.  I recommend port placement and chemotherapy education class. According to current guidelines, he would benefit from combination treatment with carboplatin, 5-FU infusion along with weekly cetuximab.  I will see him back next week for further discussion and chemotherapy consent before we starts him on first dose of treatment.  Tobacco abuse I spent some time counseling the patient the importance of tobacco cessation. he is currently attempting to quit on his own  Gastrostomy tube in place [facility]) He felt that the feeding tube is not working right and is causing pain. I recommend interventional radiologist to assess next week.  Tracheostomy status ([place])  The tracheostomy site looks clean without signs of infection. He complained of pain that could be related to  disease. He declined pain medicine.  Protein-calorie malnutrition, severe ([place])  He is dependent on feeding tube for nutritional needs. We will get nutrition to assess him in the new future to ensure he has adequate nutrition.  Heartburn symptom  He has symptoms of heartburn and constipation. I recommend use of Reglan before meals that should help increase gastric motility and relieve heartburn and possibly treat his constipation   Orders Placed This Encounter  Procedures  . IR Fluoro Guide CV Line Right    Indicate type of CVC ordering    Standing Status: Future     Number of Occurrences:      Standing Expiration Date: 12/18/2016    Order Specific Question:  Reason for exam:    Answer:  port for chemo    Order Specific Question:  Preferred Imaging Location?    Answer:  [facility]  . IR Radiologist Eval & Mgmt    Standing Status: Future     Number of Occurrences:      Standing Expiration Date: 12/18/2016    Order Specific Question:  Reason for Exam (SYMPTOM  OR DIAGNOSIS REQUIRED)    Answer:  patient felt feeding tube not functioning well    Order Specific Question:  Preferred Imaging Location?    Answer:  [facility]    Order Specific Question:  If indicated for the ordered procedure, I authorize the administration of contrast media per Radiology protocol    Answer:  Yes  . Ambulatory referral to Internal Medicine    Referral Priority:  Routine    Referral Type:  Consultation    Referral Reason:  Specialty Services Required    Requested Specialty:  Internal Medicine    Number of Visits Requested:  1   All questions were answered. The patient knows to call the clinic with any problems, questions or concerns. No barriers to learning was detected. I spent 40 minutes counseling the patient face to face. The total time spent in the appointment was 60 minutes and more than 50% was on counseling and review of test results     [facility], [place], MD 10/19/2015 2:53  PM

## 2015-10-19 NOTE — Assessment & Plan Note (Signed)
I spent some time counseling the patient the importance of tobacco cessation. he is currently attempting to quit on his own 

## 2015-10-19 NOTE — Progress Notes (Signed)
  Oncology Nurse Navigator Documentation   Navigator Encounter Type: Initial MedOnc (10/19/15 1340)     Barriers/Navigation Needs: No barriers at this time (10/19/15 1340)     Met with patient during initial consult with Dr. Alvy Bimler.  He reported:  Still smoking 2-3 cigarettes daily, drinking a couple of beers weekly.  He agreed to stop both.  Dr. [name] is ordering a larger trach.  Instilling 8 cans nutritional supplement daily.  BMs q 3 day, "Not using Miralax, I'm just stubborn."  He agreed to start using.  Continues to live at extended stay hotel where he has been the past couple of years. He verbalized understanding of Dr. Calton Dach discussion of PET results, her explanation of recommended chemotherapy regime. He understands he can contact me needs/concerns.  Gayleen Orem, RN, BSN, [place] at [place] [phone]                Time Spent with Patient: 45 (10/19/15 1340)

## 2015-10-19 NOTE — Assessment & Plan Note (Signed)
We have discussed his case at the most recent ENT tumor board. The patient have persistent disease that is not amenable for surgery or further radiation. The patient understood treatment is strictly palliative. I recommend port placement and chemotherapy education class. According to current guidelines, he would benefit from combination treatment with carboplatin, 5-FU infusion along with weekly cetuximab.  I will see him back next week for further discussion and chemotherapy consent before we starts him on first dose of treatment.

## 2015-10-19 NOTE — Telephone Encounter (Signed)
Gave and printed appt sched and avs for pt for NOV,.emailed MW to add tx.

## 2015-10-19 NOTE — Assessment & Plan Note (Signed)
He felt that the feeding tube is not working right and is causing pain. I recommend interventional radiologist to assess next week.

## 2015-10-19 NOTE — Assessment & Plan Note (Signed)
He has symptoms of heartburn and constipation. I recommend use of Reglan before meals that should help increase gastric motility and relieve heartburn and possibly treat his constipation

## 2015-10-19 NOTE — Telephone Encounter (Signed)
Per staff message and POF I have scheduled appts. Advised scheduler of appts. JMW  

## 2015-10-19 NOTE — Assessment & Plan Note (Signed)
The tracheostomy site looks clean without signs of infection. He complained of pain that could be related to disease. He declined pain medicine.

## 2015-10-19 NOTE — Assessment & Plan Note (Signed)
He is dependent on feeding tube for nutritional needs. We will get nutrition to assess him in the new future to ensure he has adequate nutrition.

## 2015-10-21 ENCOUNTER — Ambulatory Visit: Payer: Self-pay | Admitting: Hematology and Oncology

## 2015-10-21 ENCOUNTER — Ambulatory Visit: Payer: Self-pay

## 2015-10-23 ENCOUNTER — Other Ambulatory Visit: Payer: Self-pay | Admitting: Hematology and Oncology

## 2015-10-23 DIAGNOSIS — C321 Malignant neoplasm of supraglottis: Secondary | ICD-10-CM

## 2015-10-26 ENCOUNTER — Other Ambulatory Visit: Payer: Self-pay | Admitting: *Deleted

## 2015-10-26 ENCOUNTER — Telehealth: Payer: Self-pay | Admitting: *Deleted

## 2015-10-26 ENCOUNTER — Other Ambulatory Visit: Payer: Self-pay

## 2015-10-26 ENCOUNTER — Encounter (HOSPITAL_COMMUNITY): Payer: Self-pay | Admitting: Dentistry

## 2015-10-26 ENCOUNTER — Telehealth: Payer: Self-pay | Admitting: Hematology and Oncology

## 2015-10-26 DIAGNOSIS — C321 Malignant neoplasm of supraglottis: Secondary | ICD-10-CM

## 2015-10-26 MED ORDER — LIDOCAINE-PRILOCAINE 2.5-2.5 % EX CREA: 1.0000 "application " | TOPICAL_CREAM | CUTANEOUS | Status: DC | PRN

## 2015-10-26 NOTE — Telephone Encounter (Signed)
returned pt call adn lvm for pt to call back to r/s °

## 2015-10-27 ENCOUNTER — Other Ambulatory Visit: Payer: Self-pay | Admitting: Radiology

## 2015-10-27 ENCOUNTER — Telehealth: Payer: Self-pay | Admitting: *Deleted

## 2015-10-27 ENCOUNTER — Other Ambulatory Visit: Payer: Self-pay | Admitting: *Deleted

## 2015-10-27 ENCOUNTER — Other Ambulatory Visit: Payer: Self-pay | Admitting: Hematology and Oncology

## 2015-10-27 ENCOUNTER — Encounter: Payer: Self-pay | Admitting: *Deleted

## 2015-10-27 NOTE — Telephone Encounter (Signed)
  Oncology Nurse Navigator Documentation   Navigator Encounter Type: Telephone (10/27/15 1522)         Interventions: Coordination of Care (10/27/15 1522)     Patient returned my VMMs, indicated this Thursday @ 1230 best time for chemo ed class.  I confirmed with Aletta, place POF, requested one-on-one instruction per Aletta.          Time Spent with Patient: 15 (10/27/15 1522)

## 2015-10-27 NOTE — Telephone Encounter (Signed)
  Oncology Nurse Navigator Documentation   Navigator Encounter Type: Telephone (10/27/15 0957)         Interventions: Coordination of Care (10/27/15 0957)     LVM for Dack to call me to reschedule chemo education before Friday's infusion.  Gayleen Orem, RN, BSN, [place] at [place] [phone]          Time Spent with Patient: 15 (10/27/15 0957)

## 2015-10-27 NOTE — Telephone Encounter (Signed)
  Oncology Nurse Navigator Documentation   Navigator Encounter Type: Telephone (10/27/15 1302)         Interventions: Coordination of Care (10/27/15 1302)     LVMM for patient asking him to call me to reschedule Chemo Education class.  Second call today.  Gayleen Orem, RN, BSN, [place] at [place] [phone]          Time Spent with Patient: 15 (10/27/15 1302)

## 2015-10-28 ENCOUNTER — Ambulatory Visit (HOSPITAL_COMMUNITY)
Admission: RE | Admit: 2015-10-28 | Discharge: 2015-10-28 | Disposition: A | Discharge status: Home / Self Care | Payer: Medicaid Other | Source: Ambulatory Visit | Attending: Hematology and Oncology | Admitting: Hematology and Oncology

## 2015-10-28 ENCOUNTER — Encounter: Payer: Self-pay | Admitting: *Deleted

## 2015-10-28 ENCOUNTER — Other Ambulatory Visit: Payer: Self-pay | Admitting: Hematology and Oncology

## 2015-10-28 ENCOUNTER — Other Ambulatory Visit: Payer: Self-pay | Admitting: *Deleted

## 2015-10-28 ENCOUNTER — Encounter (HOSPITAL_COMMUNITY): Payer: Self-pay

## 2015-10-28 ENCOUNTER — Telehealth: Payer: Self-pay | Admitting: *Deleted

## 2015-10-28 DIAGNOSIS — I10 Essential (primary) hypertension: Secondary | ICD-10-CM | POA: Diagnosis not present

## 2015-10-28 DIAGNOSIS — C321 Malignant neoplasm of supraglottis: Secondary | ICD-10-CM

## 2015-10-28 DIAGNOSIS — Z809 Family history of malignant neoplasm, unspecified: Secondary | ICD-10-CM | POA: Insufficient documentation

## 2015-10-28 DIAGNOSIS — F17210 Nicotine dependence, cigarettes, uncomplicated: Secondary | ICD-10-CM | POA: Diagnosis not present

## 2015-10-28 DIAGNOSIS — Z79899 Other long term (current) drug therapy: Secondary | ICD-10-CM | POA: Diagnosis not present

## 2015-10-28 DIAGNOSIS — D72829 Elevated white blood cell count, unspecified: Secondary | ICD-10-CM | POA: Diagnosis not present

## 2015-10-28 DIAGNOSIS — Z931 Gastrostomy status: Secondary | ICD-10-CM | POA: Diagnosis not present

## 2015-10-28 DIAGNOSIS — Z833 Family history of diabetes mellitus: Secondary | ICD-10-CM | POA: Diagnosis not present

## 2015-10-28 DIAGNOSIS — K219 Gastro-esophageal reflux disease without esophagitis: Secondary | ICD-10-CM | POA: Diagnosis not present

## 2015-10-28 DIAGNOSIS — Z8249 Family history of ischemic heart disease and other diseases of the circulatory system: Secondary | ICD-10-CM | POA: Insufficient documentation

## 2015-10-28 DIAGNOSIS — Z923 Personal history of irradiation: Secondary | ICD-10-CM | POA: Insufficient documentation

## 2015-10-28 LAB — COMPREHENSIVE METABOLIC PANEL
ALBUMIN: 4 g/dL (ref 3.5–5.0)
ALT: 16 U/L — ABNORMAL LOW (ref 17–63)
ANION GAP: 8 (ref 5–15)
AST: 19 U/L (ref 15–41)
Alkaline Phosphatase: 65 U/L (ref 38–126)
BUN: 14 mg/dL (ref 6–20)
CALCIUM: 9.6 mg/dL (ref 8.9–10.3)
CO2: 32 mmol/L (ref 22–32)
Chloride: 97 mmol/L — ABNORMAL LOW (ref 101–111)
Creatinine, Ser: 0.74 mg/dL (ref 0.61–1.24)
GFR calc non Af Amer: >60 mL/min (ref >60)
GLUCOSE: 96 mg/dL (ref 65–99)
POTASSIUM: 3.9 mmol/L (ref 3.5–5.1)
SODIUM: 137 mmol/L (ref 135–145)
TOTAL PROTEIN: 8.2 g/dL — AB (ref 6.5–8.1)
Total Bilirubin: 0.1 mg/dL — ABNORMAL LOW (ref 0.3–1.2)

## 2015-10-28 LAB — CBC WITH DIFFERENTIAL/PLATELET
BASOS PCT: 0 %
Basophils Absolute: 0 10*3/uL (ref 0.0–0.1)
EOS ABS: 0.1 10*3/uL (ref 0.0–0.7)
EOS PCT: 1 %
HCT: 40.4 % (ref 39.0–52.0)
Hemoglobin: 13.8 g/dL (ref 13.0–17.0)
LYMPHS ABS: 0.6 10*3/uL — AB (ref 0.7–4.0)
Lymphocytes Relative: 5 %
MCH: 34.2 pg — AB (ref 26.0–34.0)
MCHC: 34.2 g/dL (ref 30.0–36.0)
MCV: 100.2 fL — ABNORMAL HIGH (ref 78.0–100.0)
MONO ABS: 1 10*3/uL (ref 0.1–1.0)
MONOS PCT: 8 %
NEUTROS PCT: 86 %
Neutro Abs: 10.4 10*3/uL — ABNORMAL HIGH (ref 1.7–7.7)
Platelets: 245 10*3/uL (ref 150–400)
RBC: 4.03 MIL/uL — ABNORMAL LOW (ref 4.22–5.81)
RDW: 14.7 % (ref 11.5–15.5)
WBC: 12.1 10*3/uL — ABNORMAL HIGH (ref 4.0–10.5)

## 2015-10-28 LAB — PROTIME-INR
INR: 1.03 (ref 0.00–1.49)
PROTHROMBIN TIME: 13.7 s (ref 11.6–15.2)

## 2015-10-28 LAB — APTT: APTT: 29 s (ref 24–37)

## 2015-10-28 LAB — MAGNESIUM: Magnesium: 1.9 mg/dL (ref 1.7–2.4)

## 2015-10-28 MED ORDER — LIDOCAINE-EPINEPHRINE 2 %-1:100000 IJ SOLN
INTRAMUSCULAR | Status: AC
  Filled 2015-10-28: qty 1

## 2015-10-28 MED ORDER — FENTANYL CITRATE (PF) 100 MCG/2ML IJ SOLN
INTRAMUSCULAR | Status: AC
  Filled 2015-10-28: qty 2

## 2015-10-28 MED ORDER — SODIUM CHLORIDE 0.9 % IV SOLN
Freq: Once | INTRAVENOUS | Status: AC
  Administered 2015-10-28: 08:00:00 via INTRAVENOUS

## 2015-10-28 MED ORDER — CEFAZOLIN SODIUM-DEXTROSE 2-3 GM-% IV SOLR
INTRAVENOUS | Status: AC
  Filled 2015-10-28: qty 50

## 2015-10-28 MED ORDER — LIDOCAINE HCL 1 % IJ SOLN
INTRAMUSCULAR | Status: AC
  Filled 2015-10-28: qty 20

## 2015-10-28 MED ORDER — MIDAZOLAM HCL 2 MG/2ML IJ SOLN
INTRAMUSCULAR | Status: AC
  Filled 2015-10-28: qty 4

## 2015-10-28 MED ORDER — CEFAZOLIN SODIUM-DEXTROSE 2-3 GM-% IV SOLR: 2.0000 g | INTRAVENOUS | Status: DC

## 2015-10-28 MED ORDER — HEPARIN SOD (PORK) LOCK FLUSH 100 UNIT/ML IV SOLN
INTRAVENOUS | Status: AC
  Filled 2015-10-28: qty 5

## 2015-10-28 NOTE — Procedures (Signed)
Interventional Radiology Procedure Note  Procedure: Placement of a right basilic vein pproach single lumen PICC.  Tip is positioned at the superior cavoatrial junction and catheter is ready for immediate use.  Complications: None Recommendations:  - Ok to shower tomorrow - Do not submerge  - Routine line care   Signed,  Dulcy Fanny. Earleen Newport, DO

## 2015-10-28 NOTE — Discharge Instructions (Signed)
Call Interventional Radiology Dept [phone] for concerns / questions regarding Feeding tube or PICC line right arm.   PICC Insertion, Care After Refer to this sheet in the next few weeks. These instructions provide you with information on caring for yourself after your procedure. Your health care provider may also give you more specific instructions. Your treatment has been planned according to current medical practices, but problems sometimes occur. Call your health care provider if you have any problems or questions after your procedure. WHAT TO EXPECT AFTER THE PROCEDURE After your procedure, it is typical to have the following:  Mild discomfort at the insertion site. This should not last more than a day. HOME CARE INSTRUCTIONS  Rest at home for the remainder of the day after the procedure.  You may bend your arm and move it freely. If your PICC is near or at the bend of your elbow, avoid activity with repeated motion at the elbow.  Avoid lifting heavy objects as instructed by your health care provider.  Avoid using a crutch with the arm on the same side as your PICC. You may need to use a walker. Bandage Care  Keep your PICC bandage (dressing) clean and dry to prevent infection.  Ask your health care provider when you may shower. To keep the dressing dry, cover the PICC with plastic wrap and tape before showering. If the dressing does become wet, replace it right after the shower.  Do not soak in the bath, swim, or use hot tubs when you have a PICC.  Change the PICC dressing as instructed by your health care provider.  Change your PICC dressing if it becomes loose or wet. General PICC Care  Check the PICC insertion site daily for leakage, redness, swelling, or pain.  Flush the PICC as directed by your health care provider. Let your health care provider know right away if the PICC is difficult to flush or does not flush. Do not use force to flush the PICC.  Do not use a  syringe that is less than 10 mL to flush the PICC.  Never pull or tug on the PICC.  Avoid blood pressure checks on the arm with the PICC.  Keep your PICC identification card with you at all times.  Do not take the PICC out yourself. Only a trained health care professional should remove the PICC. SEEK MEDICAL CARE IF:  You have pain in your arm, ear, face, or teeth.  You have fever or chills.  You have drainage from the PICC insertion site.  You have redness or palpate a "cord" around the PICC insertion site.  You cannot flush the catheter. SEEK IMMEDIATE MEDICAL CARE IF:  You have swelling in the arm in which the PICC is inserted.   This information is not intended to replace advice given to you by your health care provider. Make sure you discuss any questions you have with your health care provider.   Document Released: 09/18/2013 Document Revised: 12/03/2013 Document Reviewed: 09/18/2013 Elsevier Interactive Patient Education [employer].

## 2015-10-28 NOTE — H&P (Signed)
Chief Complaint: Patient was seen in consultation today for port a cath placement and G tube evaluation  Referring Physician(s): Gorsuch,Ni  History of Present Illness: Kyle Alexander is a 54 y.o. 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 He has evidence of persistent disease that is not amenable for surgery or further radiation. He has also been complaining of pain at gastrostomy tube insertion site as well as decreased infusion rates through tube. He presents today for Port-A-Cath placement for chemotherapy as well as gastrostomy tube evaluation and possible exchange.  Past Medical History  Diagnosis Date  . Hypertension   . Shortness of breath dyspnea   . GERD (gastroesophageal reflux disease)   . Seizures ([place])   . S/P radiation therapy 04/16/2015-05/18/2015    Laryngopharynx and bilateral neck / 50 Gy in 20 fractions to gross disease, 45 Gy in 20 fractions to high risk nodal echelons   . Heartburn symptom 10/19/2015  . Cancer ([place]) 03/16/15    left cervical lymph node / throat (2016)    Past Surgical History  Procedure Laterality Date  . Knee surgery  1998    Left  . Tonsillectomy and adenoidectomy    . Lymph node biopsy Left 03/16/15    left cervical, consistent with squamous cell carcinoma  . Hernia repair      right groin  . Tonsillectomy    . Tracheostomy tube placement N/A 03/26/2015    Procedure: TRACHEOSTOMY ;  Surgeon: [name], MD;  Location: [facility];  Service: ENT;  Laterality: N/A;  . Tooth extraction  03/26/2015    Procedure: [place];  Surgeon: [name], MD;  Location: [place];  Service: ENT;;  . Multiple extractions with alveoloplasty N/A 03/26/2015    Procedure: EXTRACTION OF TOOTH #'S 1,2,5,6,7,8,9,10,11,12,13,18,19,21,22,23,24,25,26,27,28,29  WITH AVELOPLASTY;  Surgeon: Lenn Cal, DDS;  Location: [facility];  Service: Oral Surgery;  Laterality: N/A;  . Gastrostomy N/A  03/31/2015    Procedure: OPEN GASTROSTOMY TUBE PLACEMENT;  Surgeon: Rolm Bookbinder, MD;  Location: [facility];  Service: General;  Laterality: N/A;    Allergies: Aspirin  Medications: Prior to Admission medications   Medication Sig Start Date End Date Taking? Authorizing Provider  emollient (BIAFINE) cream Apply 1 application topically 2 (two) times daily. 04/17/15  Yes [name], MD  levothyroxine (LEVOTHROID) 25 MCG tablet Take 1 tablet (25 mcg total) by mouth daily before breakfast. 10/02/15  Yes [name], MD  LORazepam (ATIVAN) 0.5 MG tablet Take 1-2 tablets 30 min before radiotherapy for anxiety. 04/29/15  Yes Arloa Koh, MD  nicotine (NICODERM CQ) 7 mg/24hr patch apply 7 mg patch daily x 4 wks after quitting smoking 09/02/15  Yes [name], MD  oxymetazoline (AFRIN) 0.05 % nasal spray Place 1 spray into both nostrils as needed for congestion.   Yes Historical Provider, MD  ibuprofen (ADVIL,MOTRIN) 200 MG tablet Take 200 mg by mouth every 6 (six) hours as needed for mild pain.    Historical Provider, MD  lidocaine-prilocaine (EMLA) cream Apply 1 application topically as needed. 10/26/15   [name], MD  metoCLOPramide (REGLAN) 5 MG/5ML solution Place 5 mLs (5 mg total) into feeding tube 4 (four) times daily -  before meals and at bedtime. 10/19/15   [name], MD  Nutritional Supplements (FEEDING SUPPLEMENT, OSMOLITE 1.2 CAL,) LIQD Give 2 cans osmolite 1.2 QID with 60 cc free water before and after bolus feeding.  Give an additional 240  cc free water via PEG daily. 04/27/15   [name], MD  polyethylene glycol [facility]) packet Take 17 g by mouth daily. 10/19/15   [name], MD     Family History  Problem Relation Age of Onset  . Cancer Mother   . Diabetes Mother   . Diabetes Father   . Hypertension Father     Social History   Social History  . Marital Status: Single    Spouse Name: N/A  . Number of Children: N/A  . Years of Education: N/A   Social History Main  Topics  . Smoking status: Current Every Day Smoker -- 0.25 packs/day for 35 years    Types: Cigarettes  . Smokeless tobacco: Former Systems developer    Types: Chew  . Alcohol Use: 18.0 oz/week    30 Standard drinks or equivalent per week     Comment: occasional  . Drug Use: Yes    Special: Marijuana     Comment: occasional   . Sexual Activity: Not Currently     Comment: significant other Simmie Davies   Other Topics Concern  . None   Social History Narrative   Lives alone, unemployed. Previously worked in Architect.      Review of Systems  Constitutional: Positive for fatigue. Negative for fever and chills.  HENT: Positive for postnasal drip and sinus pressure.   Respiratory:       Occ cough  Cardiovascular: Negative for chest pain.  Gastrointestinal: Positive for nausea and abdominal pain. Negative for vomiting and blood in stool.  Genitourinary: Negative for dysuria and hematuria.  Musculoskeletal: Negative for back pain.  Neurological: Positive for headaches.    Vital Signs: Temp(Src) 99 F (37.2 C) (Oral)  Physical Exam  Constitutional: He is oriented to person, place, and time. He appears well-developed and well-nourished.  Neck:  Trach intact  Cardiovascular: Normal rate and regular rhythm.   Pulmonary/Chest: Effort normal and breath sounds normal.  Abdominal: Soft. Bowel sounds are normal. There is tenderness.  G tube intact, insertion site ok, mildly tender to palpation  Musculoskeletal: He exhibits no edema.  Neurological: He is alert and oriented to person, place, and time.    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 EXAM: NUCLEAR MEDICINE PET SKULL BASE TO THIGH TECHNIQUE: 7.1 mCi F-18 FDG was injected intravenously. Full-ring PET imaging was  performed from the skull base to thigh after the radiotracer. CT data was obtained and used for attenuation correction and anatomic localization. FASTING BLOOD GLUCOSE:  Value: 84 mg/dl COMPARISON:  03/15/2015 neck CT. FINDINGS: NECK The primary supraglottic laryngeal mass measures approximately 4.2 x 3.1 cm (series 2/ image 35) and demonstrates persistent intense hypermetabolism with max SUV 19.3, previously 4.7 x 4.1 cm, mildly decreased in size. There is persistent hypermetabolism within a 1.5 cm short axis right retropharyngeal node (2/22) at the level of the nasopharynx with max SUV 10.4, decreased in size from 3.0 cm. There is a hypermetabolic mildly enlarged 1.2 cm left level 2 cervical node (2/31) with max SUV 10.5, decreased in size from 2.4 cm. There is a hypermetabolic 1.8 cm right level 2 cervical node (2/28) with max SUV 5.1, decreased in size from 3.1 cm. There is hypermetabolic left level 4 cervical lymphadenopathy, decreased in size, for example a hypermetabolic  1.2 cm left level 4 cervical node (2/46) with max SUV 8.8, decreased in size from 1.9 cm. There is a hypermetabolic 1.4 cm hypodense left thyroid lobe nodule (2/44) with max SUV 13.6, stable in size. Tracheostomy tube is noted with the tip in the tracheal lumen at the thoracic inlet. CHEST No hypermetabolic axillary, mediastinal or hilar nodes. No acute consolidative airspace disease or significant pulmonary nodules. ABDOMEN/PELVIS No abnormal hypermetabolic activity within the liver, pancreas, adrenal glands, or spleen. No hypermetabolic lymph nodes in the abdomen or pelvis. Cholelithiasis. Percutaneous gastrostomy tube is in place in the proximal body of the stomach. Atherosclerotic nonaneurysmal abdominal aorta. SKELETON No focal hypermetabolic activity to suggest skeletal metastasis. IMPRESSION: 1. Persistent hypermetabolism within the primary supraglottic laryngeal tumor and within right retropharyngeal, bilateral level II and left level  IV cervical nodal metastases, with interval partial treatment response by size compared to the 03/15/2015 neck CT. 2. No distant hypermetabolic metastatic disease. 3. Stable size of hypermetabolic left thyroid lobe 1.4 cm nodule. Correlate with thyroid ultrasound as clinically warranted, as a separate left thyroid primary malignancy cannot be excluded. Electronically Signed   By: Ilona Sorrel M.D.   On: 10/01/2015 17:45    Labs:  CBC:  Recent Labs  03/31/15 0530 04/01/15 0520 04/03/15 0435 10/28/15 0800  WBC 9.3 10.5 8.5 12.1*  HGB 9.7* 10.6* 9.8* 13.8  HCT 28.6* 30.9* 29.5* 40.4  PLT 189 225 294 245    COAGS:  Recent Labs  03/15/15 1330 03/30/15 0345 10/28/15 0800  INR 1.08 1.14 1.03  APTT 34  --  29    BMP:  Recent Labs  04/02/15 0628 04/03/15 0435 04/04/15 0408 10/28/15 0800  NA 135 135 138 137  K 3.4* 3.3* 3.8 3.9  CL 93* 95* 97 97*  CO2 34* 35* 34* 32  GLUCOSE 117* 91 137* 96  BUN <5* 7 6 14   CALCIUM 9.0 8.8 9.2 9.6  CREATININE 0.57 0.42* 0.46* 0.74  GFRNONAA >90 >90 >90 >60  GFRAA >90 >90 >90 >60    LIVER FUNCTION TESTS:  Recent Labs  03/15/15 0740 03/27/15 0256 10/28/15 0800  BILITOT 0.6 1.2 0.1*  AST 26 17 19   ALT 15 9 16*  ALKPHOS 76 59 65  PROT 8.5* 6.4 8.2*  ALBUMIN 3.4* 2.4* 4.0    TUMOR MARKERS: No results for input(s): AFPTM, CEA, CA199, CHROMGRNA in the last 8760 hours.  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 He has evidence of persistent disease that is not amenable for surgery or further radiation. He has also been complaining of pain at gastrostomy tube insertion site as well as decreased infusion rates through tube. He presents today for Port-A-Cath placement for chemotherapy as well as gastrostomy tube evaluation and possible exchange. Risks and benefits discussed with the patient including, but not limited to bleeding,  infection, pneumothorax, or fibrin sheath development and need for additional procedures. All of the patient's questions were answered, patient is agreeable to proceed. Consent signed and in chart.  Current temp 99, WBC 12.1, pt has had brownish-yellow nasal secretions for past several days. Will confer with Drs. Wagner/Gorsuch prior to proceeding with port placement.   Following d/w Drs. Gorsuch/Wagner decision made to proceed with PICC instead of port a cath placement today. Pt aware.  Thank you for this interesting consult.  I greatly enjoyed meeting SLATER GETTLER and look forward to participating in their care.  A copy of this report was sent to the requesting provider on this date.  Signed: D. Rowe Robert 10/28/2015, 9:02 AM   I spent a total of 15 minutes  in face to face in clinical consultation, greater than 50% of which was counseling/coordinating care for port a cath/central venous catheter placement/gastrostomy tube evaluation

## 2015-10-28 NOTE — Telephone Encounter (Signed)
  Oncology Nurse Navigator Documentation   Navigator Encounter Type: Other (10/28/15 1410)     Received email response from Susette Racer to my earlier VM and email requests for patient to be seen by [facility] for establishment of primary care follow-up.  She indicated patient has appt 11/02/15 at 3:15 PM.  Gayleen Orem, RN, BSN, [place] at [place] [phone]                    Time Spent with Patient: 15 (10/28/15 1410)

## 2015-10-29 ENCOUNTER — Telehealth: Payer: Self-pay | Admitting: *Deleted

## 2015-10-29 ENCOUNTER — Other Ambulatory Visit: Payer: Medicaid Other

## 2015-10-29 ENCOUNTER — Other Ambulatory Visit: Payer: Self-pay | Admitting: Hematology and Oncology

## 2015-10-29 MED ORDER — SODIUM CHLORIDE 0.9 % IR SOLN: 1000.0000 mL | Freq: Every day | Status: DC

## 2015-10-29 NOTE — Telephone Encounter (Signed)
  Oncology Nurse Navigator Documentation   Navigator Encounter Type: Telephone (10/29/15 1054)   Treatment Phase: Other (10/29/15 1054)     Spoke with patient:  Informed him Rx for NS he requested yesterday will be given to him when he attends his 12:30 Chemo Ed class today.   Confirmed his understanding of 11/21 3:15 PM appt with [facility].  Gayleen Orem, RN, BSN, [place] at [place] [phone]                 Time Spent with Patient: 15 (10/29/15 1054)

## 2015-10-30 ENCOUNTER — Telehealth: Payer: Self-pay | Admitting: Hematology and Oncology

## 2015-10-30 ENCOUNTER — Other Ambulatory Visit: Payer: Self-pay | Admitting: Hematology and Oncology

## 2015-10-30 ENCOUNTER — Encounter: Payer: Self-pay | Admitting: Hematology and Oncology

## 2015-10-30 ENCOUNTER — Telehealth: Payer: Self-pay | Admitting: *Deleted

## 2015-10-30 ENCOUNTER — Ambulatory Visit (HOSPITAL_BASED_OUTPATIENT_CLINIC_OR_DEPARTMENT_OTHER): Payer: Medicaid Other

## 2015-10-30 ENCOUNTER — Other Ambulatory Visit: Payer: Self-pay | Admitting: *Deleted

## 2015-10-30 ENCOUNTER — Ambulatory Visit (HOSPITAL_BASED_OUTPATIENT_CLINIC_OR_DEPARTMENT_OTHER): Payer: Medicaid Other | Admitting: Hematology and Oncology

## 2015-10-30 VITALS — BP 126/84 | HR 114 | Temp 98.9°F | Resp 18 | Ht 70.0 in | Wt 153.4 lb

## 2015-10-30 DIAGNOSIS — C321 Malignant neoplasm of supraglottis: Secondary | ICD-10-CM

## 2015-10-30 DIAGNOSIS — J3489 Other specified disorders of nose and nasal sinuses: Secondary | ICD-10-CM | POA: Diagnosis not present

## 2015-10-30 DIAGNOSIS — Z720 Tobacco use: Secondary | ICD-10-CM

## 2015-10-30 MED ORDER — HEPARIN SOD (PORK) LOCK FLUSH 100 UNIT/ML IV SOLN
500.0000 [IU] | Freq: Once | INTRAVENOUS | Status: AC
  Administered 2015-10-30: 500 [IU] via INTRAVENOUS
  Filled 2015-10-30: qty 5

## 2015-10-30 MED ORDER — NORMAL SALINE FLUSH 0.9 % IV SOLN: 10.0000 mL | Freq: Every day | INTRAVENOUS | Status: DC

## 2015-10-30 MED ORDER — FLUTICASONE PROPIONATE 50 MCG/ACT NA SUSP: 2.0000 | Freq: Every day | NASAL | Status: DC

## 2015-10-30 MED ORDER — SODIUM CHLORIDE 0.9 % IJ SOLN
10.0000 mL | INTRAMUSCULAR | Status: DC | PRN
  Administered 2015-10-30: 10 mL via INTRAVENOUS
  Filled 2015-10-30: qty 10

## 2015-10-30 NOTE — Assessment & Plan Note (Signed)
He has profound nasal drainage. Clinically, he does not appear to have bacterial infection. I suspect he has allergic rhinitis and recommended over-the-counter allergy medicine. I will prescribe him with Flonase. I recommend him to quit smoking

## 2015-10-30 NOTE — Assessment & Plan Note (Addendum)
The decision was made based on publication at the [facility]. It is a category 1 recommendation from NCCN. Vermorken, et al. Alison Stalling J Med [id number]  The chemotherapy consists of   1. Carboplatin (at an area under the curve of 5 mg per milliliter per minute, as a 1-hour intravenous infusion on day 1) plus  2. Fluorouracil (at a dose of 1000 mg per square meter per day for 4 days) every 3 weeks for a maximum of 6 cycles  3. Cetuximab (at a dose of 400 mg per square meter initially, as a 2-hour intravenous infusion, then 250 mg per square meter, as a 1-hour intravenous infusion per week) for a maximum of 6 cycles.   Patients with stable disease who received chemotherapy plus cetuximab continued to receive cetuximab until disease progression or unacceptable toxic effects, whichever occurred first.  Adding cetuximab to platinum-based chemotherapy with fluorouracil (platinum-fluorouracil) significantly prolonged the median overall survival from 7.4 months in the chemotherapy-alone group to 10.1 months in the group that received chemotherapy plus cetuximab (hazard ratio for death, 0.80; 95% confidence interval, 0.64 to 0.99; P=0.04).   The addition of cetuximab prolonged the median progression-free survival time from 3.3 to 5.6 months (hazard ratio for progression, 0.54; P<0.001) and increased the response rate from 20% to 36% (P<0.001).   There were no cetuximab-related deaths. Goal of treatment is palliative Due to transportation issue, the patient declined chemotherapy today. I will still get him to the infusion room to learn about PICC line flushes and to have his PICC dressing changed. I will reschedule his chemotherapy to start in 2 weeks on 11/13/2015 and plan to see him the week after for toxicity review.

## 2015-10-30 NOTE — Telephone Encounter (Signed)
Gave and printed appt sched and avs for pt for NOV and DEC °

## 2015-10-30 NOTE — Progress Notes (Signed)
Messaged received by Ann Lions RN; per Tammi RN ( Dr. Calton Dach nurse) pt is to not receive treatment today per pt request, Pt to have PICC dressing changed and teach pt how to flush his PICC line. Pt to infusion and PICC dressing changed, after demonstrating the flush process to the pt, pt asked to demonstrate to nurse how to flush his PICC, pt states he is unable to do it himself and has no one at home that could help him, that he would rather report to the clinic daily to have PICC flushed. Tammi RN aware. PT states that "I will be able to get to the clinic, I will find a way." Per Tammi RN pt is to report to the clinic 3 times a week for a PICC flushed and POF was sent to schedulers per [place]. Pt aware of the importance of the PICC flushes and verbalizes understanding and ensures that he will be here at the scheduled times. Pt taken to scheduling so he can schedule the flush appointments.

## 2015-10-30 NOTE — Patient Instructions (Signed)

## 2015-10-30 NOTE — Progress Notes (Signed)
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[place])   03/15/2015 - 03/17/2015 Hospital Admission He was admitted to the hospital for evaluation of dysphagia, SOB, hemoptosis, hoarseness, 30-40 pound weight loss and worsening bilateral neck masses for 5 months   03/15/2015 Imaging Ct showed extensive circumferential malignancy in the hypopharyngeal/supraglottic region with regional LN metastases   03/16/2015 Procedure He underwent ULTRASOUND-GUIDED BIOPSY OF LEFT CERVICAL LYMPH NODES   03/16/2015 Pathology Results Accession: [id number] LN biopsy showed invasive squamous cell cancer   03/16/2015 Pathology Results Accession: [id number] showed atypical squamous cells   03/25/2015 - 04/07/2015 Hospital Admission He was admitted to the hospital and underwent tracheostomy placement, feeeding tube placement but subsequently left [facility]   03/26/2015 Surgery He had multiple extraction of tooth numbers 1, 2, 5, 6, 7, 8, 9, 10, 11, 12, 13, 18, 19, 21, 22, 23, 24, 25, 26, 27, 28, and 29. and 4 Quadrants of alveoloplasty   03/26/2015 Surgery He underwent tracheostomy   03/31/2015 Surgery He had open gastrostomy tube placement by Dr. Donne Hazel   04/16/2015 - 05/18/2015 Radiation Therapy Laryngopharynx and bilateral neck / 50 Gy in 20 fractions to gross disease, 45 Gy in 20 fractions to high risk nodal echelons  Beams/energy: Helical IMRT / 6 MV photons   04/16/2015 Procedure Fluoroscopic reposition of the 18 French gastrostomy confirmed back in the stomach,   07/03/2015 Procedure IR performed replacement of gastrostomy tube with a new 47 French balloon retention tube   10/01/2015 Imaging PEt scan showed persistent hypermetabolism within the primary  supraglottic laryngeal tumor and within right retropharyngeal, bilateral level II and left level IV cervical nodal metastases   10/28/2015 Procedure He had placement of PICC line. The IR was not able to place PORT due to suspected upper respiratory infection    INTERVAL HISTORY: Please see below for problem oriented charting. He is seen today for chemotherapy consent and start cycle 1 of treatment. He was not able to get port placement because he had nasal drainage. The nasal congestion is causing difficulty with sleep and occasional headaches. He has no cough fevers or chills. He has not tried anything for the nasal drainage. He is not able to start treatment because of lack of transportation. He requests treatment to be started after Thanksgiving holidays  REVIEW OF SYSTEMS:   Constitutional: Denies fevers, chills or abnormal weight loss Eyes: Denies blurriness of vision Ears, nose, mouth, throat, and face: Denies mucositis or sore throat Respiratory: Denies cough, dyspnea or wheezes Cardiovascular: Denies palpitation, chest discomfort or lower extremity swelling Gastrointestinal:  Denies nausea, heartburn or change in bowel habits Skin: Denies abnormal skin rashes Lymphatics: Denies new lymphadenopathy or easy bruising Neurological:Denies numbness, tingling or new weaknesses Behavioral/Psych: Mood is stable, no new changes  All other systems were reviewed with the patient and are negative.  I have reviewed the past medical history, past surgical history, social history and family history with the patient and they are unchanged from previous note.  ALLERGIES:  is allergic to aspirin.  MEDICATIONS:  Current Outpatient Prescriptions  Medication Sig Dispense Refill  . emollient (BIAFINE) cream Apply 1 application topically 2 (two) times daily.    Marland Kitchen ibuprofen (ADVIL,MOTRIN) 200 MG tablet Take 200 mg by mouth  every 6 (six) hours as needed for mild pain.    Marland Kitchen levothyroxine (LEVOTHROID)  25 MCG tablet Take 1 tablet (25 mcg total) by mouth daily before breakfast. 30 tablet 5  . lidocaine-prilocaine (EMLA) cream Apply 1 application topically as needed. 30 g 0  . LORazepam (ATIVAN) 0.5 MG tablet Take 1-2 tablets 30 min before radiotherapy for anxiety. 25 tablet 0  . metoCLOPramide (REGLAN) 5 MG/5ML solution Place 5 mLs (5 mg total) into feeding tube 4 (four) times daily -  before meals and at bedtime. 120 mL 0  . nicotine (NICODERM CQ) 7 mg/24hr patch apply 7 mg patch daily x 4 wks after quitting smoking 14 patch 1  . Nutritional Supplements (FEEDING SUPPLEMENT, OSMOLITE 1.2 CAL,) LIQD Give 2 cans osmolite 1.2 QID with 60 cc free water before and after bolus feeding.  Give an additional 240 cc free water via PEG daily. 1896 mL 0  . omeprazole (PRILOSEC) 20 MG capsule Take 1 capsule by mouth daily.  6  . oxyCODONE (ROXICODONE) 5 MG/5ML solution Take 5 mLs by mouth 2 (two) times daily as needed.  0  . oxymetazoline (AFRIN) 0.05 % nasal spray Place 1 spray into both nostrils as needed for congestion.    . polyethylene glycol (MIRALAX) packet Take 17 g by mouth daily. 14 each 0  . sodium chloride irrigation 0.9 % irrigation Irrigate with 1,000 mLs as directed daily. Irrigate Feeding tube as directed daily 1000 mL 11  . fluticasone (FLONASE) 50 MCG/ACT nasal spray Place 2 sprays into both nostrils daily. 16 g 2  . Sodium Chloride Flush (NORMAL SALINE FLUSH) 0.9 % SOLN Inject 10 mLs into the vein daily. 30 Syringe 0   No current facility-administered medications for this visit.   Facility-Administered Medications Ordered in Other Visits  Medication Dose Route Frequency Provider Last Rate Last Dose  . heparin lock flush 100 unit/mL  500 Units Intravenous Once [name], MD      . sodium chloride 0.9 % injection 10 mL  10 mL Intravenous PRN [name], MD        PHYSICAL EXAMINATION: ECOG PERFORMANCE STATUS: 0 - Asymptomatic  Filed Vitals:   10/30/15 0911  BP: 126/84  Pulse: 114   Temp: 98.9 F (37.2 C)  Resp: 18   Filed Weights   10/30/15 0911  Weight: 153 lb 6.4 oz (69.582 kg)    GENERAL:alert, no distress and comfortable SKIN: skin color, texture, turgor are normal, no rashes or significant lesions EYES: normal, Conjunctiva are pink and non-injected, sclera clear OROPHARYNX:no exudate, no erythema and lips, buccal mucosa, and tongue normal  Musculoskeletal:no cyanosis of digits and no clubbing  NEURO: alert & oriented x 3 with fluent speech, no focal motor/sensory deficits  LABORATORY DATA:  I have reviewed the data as listed    Component Value Date/Time   NA 137 10/28/2015 0800   K 3.9 10/28/2015 0800   CL 97* 10/28/2015 0800   CO2 32 10/28/2015 0800   GLUCOSE 96 10/28/2015 0800   BUN 14 10/28/2015 0800   CREATININE 0.74 10/28/2015 0800   CALCIUM 9.6 10/28/2015 0800   PROT 8.2* 10/28/2015 0800   ALBUMIN 4.0 10/28/2015 0800   AST 19 10/28/2015 0800   ALT 16* 10/28/2015 0800   ALKPHOS 65 10/28/2015 0800   BILITOT 0.1* 10/28/2015 0800   GFRNONAA >60 10/28/2015 0800   GFRAA >60 10/28/2015 0800    No results found for: SPEP, UPEP  Lab Results  Component Value Date  WBC 12.1* 10/28/2015   NEUTROABS 10.4* 10/28/2015   HGB 13.8 10/28/2015   HCT 40.4 10/28/2015   MCV 100.2* 10/28/2015   PLT 245 10/28/2015      Chemistry      Component Value Date/Time   NA 137 10/28/2015 0800   K 3.9 10/28/2015 0800   CL 97* 10/28/2015 0800   CO2 32 10/28/2015 0800   BUN 14 10/28/2015 0800   CREATININE 0.74 10/28/2015 0800      Component Value Date/Time   CALCIUM 9.6 10/28/2015 0800   ALKPHOS 65 10/28/2015 0800   AST 19 10/28/2015 0800   ALT 16* 10/28/2015 0800   BILITOT 0.1* 10/28/2015 0800       RADIOGRAPHIC STUDIES: I have personally reviewed the radiological images as listed and agreed with the findings in the report. Ir Fluoro Guide Cv Line Right  10/28/2015  CLINICAL DATA:  54 year old male with a history of head neck cancer. He has  been referred for port catheter placement, however, preoperative labs demonstrate a leukocytosis, with no etiology. He has symptoms of upper respiratory infection. Port catheter implantation will be deferred until resolution of the leukocytosis. A upper extremity PICC will be placed so that he can initiate therapy. EXAM: PICC PLACEMENT WITH ULTRASOUND AND FLUOROSCOPY FLUOROSCOPY TIME:  Eighteen seconds TECHNIQUE: After written informed consent was obtained, patient was placed in the supine position on angiographic table. Patency of the right basilic vein was confirmed with ultrasound with image documentation. An appropriate skin site was determined. Skin site was marked. Region was prepped using maximum barrier technique including cap and mask, sterile gown, sterile gloves, large sterile sheet, and Chlorhexidine as cutaneous antisepsis. The region was infiltrated locally with 1% lidocaine. Under real-time ultrasound guidance, the right basilic vein was accessed with a 21 gauge micropuncture needle; the needle tip within the vein was confirmed with ultrasound image documentation. Needle exchanged over a 018 guidewire for a peel-away sheath, through which a 5-French single-lumen power injectable PICC trimmed to 40cm was advanced, positioned with its tip near the cavoatrial junction. Spot chest radiograph confirms appropriate catheter position. Catheter was flushed per protocol and secured externally with StatLock. The patient tolerated procedure well, with no immediate complication. COMPLICATIONS: None IMPRESSION: Status post placement of right upper extremity basilic vein PICC measuring 40 cm. Catheter ready for use. Signed, Dulcy Fanny. Earleen Newport, DO [facility] Radiology Electronically Signed   By: Corrie Mckusick D.O.   On: 10/28/2015 10:35   Ir US Guide Vasc Access Right  10/28/2015  CLINICAL DATA:  54 year old male with a history of head neck cancer. He has been  referred for port catheter placement, however, preoperative labs demonstrate a leukocytosis, with no etiology. He has symptoms of upper respiratory infection. Port catheter implantation will be deferred until resolution of the leukocytosis. A upper extremity PICC will be placed so that he can initiate therapy. EXAM: PICC PLACEMENT WITH ULTRASOUND AND FLUOROSCOPY FLUOROSCOPY TIME:  Eighteen seconds TECHNIQUE: After written informed consent was obtained, patient was placed in the supine position on angiographic table. Patency of the right basilic vein was confirmed with ultrasound with image documentation. An appropriate skin site was determined. Skin site was marked. Region was prepped using maximum barrier technique including cap and mask, sterile gown, sterile gloves, large sterile sheet, and Chlorhexidine as cutaneous antisepsis. The region was infiltrated locally with 1% lidocaine. Under real-time ultrasound guidance, the right basilic vein was accessed with a 21 gauge micropuncture needle; the needle tip within  the vein was confirmed with ultrasound image documentation. Needle exchanged over a 018 guidewire for a peel-away sheath, through which a 5-French single-lumen power injectable PICC trimmed to 40cm was advanced, positioned with its tip near the cavoatrial junction. Spot chest radiograph confirms appropriate catheter position. Catheter was flushed per protocol and secured externally with StatLock. The patient tolerated procedure well, with no immediate complication. COMPLICATIONS: None IMPRESSION: Status post placement of right upper extremity basilic vein PICC measuring 40 cm. Catheter ready for use. Signed, Dulcy Fanny. Earleen Newport, DO [facility] Radiology Electronically Signed   By: Corrie Mckusick D.O.   On: 10/28/2015 10:35     ASSESSMENT & PLAN:  Squamous cell carcinoma of supraglottis The decision was made based on publication at the [facility]. It is a category  1 recommendation from NCCN. Vermorken, et al. Alison Stalling J Med [id number]  The chemotherapy consists of   1. Carboplatin (at an area under the curve of 5 mg per milliliter per minute, as a 1-hour intravenous infusion on day 1) plus  2. Fluorouracil (at a dose of 1000 mg per square meter per day for 4 days) every 3 weeks for a maximum of 6 cycles  3. Cetuximab (at a dose of 400 mg per square meter initially, as a 2-hour intravenous infusion, then 250 mg per square meter, as a 1-hour intravenous infusion per week) for a maximum of 6 cycles.   Patients with stable disease who received chemotherapy plus cetuximab continued to receive cetuximab until disease progression or unacceptable toxic effects, whichever occurred first.  Adding cetuximab to platinum-based chemotherapy with fluorouracil (platinum-fluorouracil) significantly prolonged the median overall survival from 7.4 months in the chemotherapy-alone group to 10.1 months in the group that received chemotherapy plus cetuximab (hazard ratio for death, 0.80; 95% confidence interval, 0.64 to 0.99; P=0.04).   The addition of cetuximab prolonged the median progression-free survival time from 3.3 to 5.6 months (hazard ratio for progression, 0.54; P<0.001) and increased the response rate from 20% to 36% (P<0.001).   There were no cetuximab-related deaths. Goal of treatment is palliative Due to transportation issue, the patient declined chemotherapy today. I will still get him to the infusion room to learn about PICC line flushes and to have his PICC dressing changed. I will reschedule his chemotherapy to start in 2 weeks on 11/13/2015 and plan to see him the week after for toxicity review.  Tobacco abuse I spent some time counseling the patient the importance of tobacco cessation. he is currently attempting to quit on his own    Nasal drainage He has profound nasal drainage. Clinically, he does not appear to have bacterial infection. I  suspect he has allergic rhinitis and recommended over-the-counter allergy medicine. I will prescribe him with Flonase. I recommend him to quit smoking   Orders Placed This Encounter  Procedures  . Comprehensive metabolic panel    Standing Status: Standing     Number of Occurrences: 22     Standing Expiration Date: 10/29/2016  . CBC with Differential    Standing Status: Standing     Number of Occurrences: 20     Standing Expiration Date: 10/30/2016  . Magnesium    Standing Status: Standing     Number of Occurrences: 20     Standing Expiration Date: 10/30/2016   All questions were answered. The patient knows to call the clinic with any problems, questions or concerns. No barriers to learning was detected. I spent 20 minutes counseling the  patient face to face. The total time spent in the appointment was 25 minutes and more than 50% was on counseling and review of test results     [facility], [place], MD 10/30/2015 10:03 AM

## 2015-10-30 NOTE — Telephone Encounter (Signed)
per pof to sch pt appt-gave pt copy of avs-sent MAW email to sch pt trmt sch

## 2015-10-30 NOTE — Telephone Encounter (Signed)
Per staff message and POF I have scheduled appts. Advised scheduler of appts. JMW  

## 2015-10-30 NOTE — Assessment & Plan Note (Signed)
I spent some time counseling the patient the importance of tobacco cessation. he is currently attempting to quit on his own 

## 2015-11-02 ENCOUNTER — Ambulatory Visit (HOSPITAL_BASED_OUTPATIENT_CLINIC_OR_DEPARTMENT_OTHER): Payer: Medicaid Other

## 2015-11-02 ENCOUNTER — Ambulatory Visit (HOSPITAL_COMMUNITY): Payer: Medicaid - Dental | Admitting: Dentistry

## 2015-11-02 ENCOUNTER — Encounter (INDEPENDENT_AMBULATORY_CARE_PROVIDER_SITE_OTHER): Payer: Self-pay

## 2015-11-02 ENCOUNTER — Encounter (HOSPITAL_COMMUNITY): Payer: Self-pay | Admitting: Dentistry

## 2015-11-02 ENCOUNTER — Ambulatory Visit: Payer: Self-pay | Admitting: Internal Medicine

## 2015-11-02 VITALS — BP 138/96 | HR 101 | Temp 98.4°F

## 2015-11-02 DIAGNOSIS — Z452 Encounter for adjustment and management of vascular access device: Secondary | ICD-10-CM

## 2015-11-02 DIAGNOSIS — Z463 Encounter for fitting and adjustment of dental prosthetic device: Secondary | ICD-10-CM

## 2015-11-02 DIAGNOSIS — C321 Malignant neoplasm of supraglottis: Secondary | ICD-10-CM

## 2015-11-02 DIAGNOSIS — K08109 Complete loss of teeth, unspecified cause, unspecified class: Secondary | ICD-10-CM

## 2015-11-02 DIAGNOSIS — K117 Disturbances of salivary secretion: Secondary | ICD-10-CM

## 2015-11-02 DIAGNOSIS — K0820 Unspecified atrophy of edentulous alveolar ridge: Secondary | ICD-10-CM

## 2015-11-02 DIAGNOSIS — Z930 Tracheostomy status: Secondary | ICD-10-CM

## 2015-11-02 DIAGNOSIS — Z923 Personal history of irradiation: Secondary | ICD-10-CM

## 2015-11-02 DIAGNOSIS — R682 Dry mouth, unspecified: Secondary | ICD-10-CM

## 2015-11-02 MED ORDER — HEPARIN SOD (PORK) LOCK FLUSH 100 UNIT/ML IV SOLN
250.0000 [IU] | Freq: Once | INTRAVENOUS | Status: AC
  Administered 2015-11-02: 250 [IU] via INTRAVENOUS
  Filled 2015-11-02: qty 5

## 2015-11-02 MED ORDER — SODIUM CHLORIDE 0.9 % IJ SOLN
10.0000 mL | INTRAMUSCULAR | Status: DC | PRN
  Administered 2015-11-02: 10 mL via INTRAVENOUS
  Filled 2015-11-02: qty 10

## 2015-11-02 NOTE — Progress Notes (Signed)
11/02/2015  Patient Name:   Kyle Alexander Date of Birth:   09-19-1961 Medical Record Number: [id number]  BP 138/96 mmHg  Pulse 101  Temp(Src) 98.4 F (36.9 C) (Oral)  [name] presents for evaluation of recently inserted upper and lower complete dentures. Patient indicates that he is to start chemotherapy in the first part of December. SUBJECTIVE: Patient with minimal complaints from the dentures. Dentures cause him to gag from time to time. OBJECTIVE: There is no sign of denture irritation or erythema. Procedure: Pressure indicating paste was applied to the dentures. Adjustments were made as needed. [place] Occlusion evaluated and adjustments made as needed for Centric Relation and protrusive strokes. Patient accepts results. Patient to keep dentures out if sore spots develop. Use salt water rinses as needed to aid healing. Return to clinic as scheduled for denture adjustment.   Call if problems arise before then.  Lenn Cal, DDS

## 2015-11-02 NOTE — Patient Instructions (Signed)
Patient to keep dentures out if sore spots develop. Use salt water rinses as needed to aid healing. Return to clinic as scheduled for denture adjustment.   Call if problems arise before then.  Alyxandria Wentz F. Kailin Leu, DDS  

## 2015-11-04 ENCOUNTER — Ambulatory Visit (HOSPITAL_BASED_OUTPATIENT_CLINIC_OR_DEPARTMENT_OTHER): Payer: Medicaid Other

## 2015-11-04 VITALS — BP 135/77 | HR 97 | Temp 98.8°F | Resp 14

## 2015-11-04 DIAGNOSIS — Z452 Encounter for adjustment and management of vascular access device: Secondary | ICD-10-CM | POA: Diagnosis present

## 2015-11-04 DIAGNOSIS — C321 Malignant neoplasm of supraglottis: Secondary | ICD-10-CM | POA: Diagnosis not present

## 2015-11-04 MED ORDER — SODIUM CHLORIDE 0.9 % IJ SOLN
10.0000 mL | INTRAMUSCULAR | Status: DC | PRN
  Administered 2015-11-04: 10 mL via INTRAVENOUS
  Filled 2015-11-04: qty 10

## 2015-11-04 MED ORDER — HEPARIN SOD (PORK) LOCK FLUSH 100 UNIT/ML IV SOLN
500.0000 [IU] | Freq: Once | INTRAVENOUS | Status: AC
  Administered 2015-11-04: 500 [IU] via INTRAVENOUS
  Filled 2015-11-04: qty 5

## 2015-11-04 NOTE — Patient Instructions (Signed)
PICC Home Guide A peripherally inserted central catheter (PICC) is a long, thin, flexible tube that is inserted into a vein in the upper arm. It is a form of intravenous (IV) access. It is considered to be a "central" line because the tip of the PICC ends in a large vein in your chest. This large vein is called the superior vena cava (SVC). The PICC tip ends in the SVC because there is a lot of blood flow in the SVC. This allows medicines and IV fluids to be quickly distributed throughout the body. The PICC is inserted using a sterile technique by a specially trained nurse or physician. After the PICC is inserted, a chest X-ray exam is done to be sure it is in the correct place.  A PICC may be placed for different reasons, such as:  To give medicines and liquid nutrition that can only be given through a central line. Examples are:  Certain antibiotic treatments.  Chemotherapy.  Total parenteral nutrition (TPN).  To take frequent blood samples.  To give IV fluids and blood products.  If there is difficulty placing a peripheral intravenous (PIV) catheter. If taken care of properly, a PICC can remain in place for several months. A PICC can also allow a person to go home from the hospital early. Medicine and PICC care can be managed at home by a family member or home health care team. WHAT PROBLEMS CAN HAPPEN WHEN I HAVE A PICC? Problems with a PICC can occasionally occur. These may include the following:  A blood clot (thrombus) forming in or at the tip of the PICC. This can cause the PICC to become clogged. A clot-dissolving medicine called tissue plasminogen activator (tPA) can be given through the PICC to help break up the clot.  Inflammation of the vein (phlebitis) in which the PICC is placed. Signs of inflammation may include redness, pain at the insertion site, red streaks, or being able to feel a "cord" in the vein where the PICC is located.  Infection in the PICC or at the insertion  site. Signs of infection may include fever, chills, redness, swelling, or pus drainage from the PICC insertion site.  PICC movement (malposition). The PICC tip may move from its original position due to excessive physical activity, forceful coughing, sneezing, or vomiting.  A break or cut in the PICC. It is important to not use scissors near the PICC.  Nerve or tendon irritation or injury during PICC insertion. WHAT SHOULD I KEEP IN MIND ABOUT ACTIVITIES WHEN I HAVE A PICC?  You may bend your arm and move it freely. If your PICC is near or at the bend of your elbow, avoid activity with repeated motion at the elbow.  Rest at home for the remainder of the day following PICC line insertion.  Avoid lifting heavy objects as instructed by your health care provider.  Avoid using a crutch with the arm on the same side as your PICC. You may need to use a walker. WHAT SHOULD I KNOW ABOUT MY PICC DRESSING?  Keep your PICC bandage (dressing) clean and dry to prevent infection.  Ask your health care provider when you may shower. Ask your health care provider to teach you how to wrap the PICC when you do take a shower.  Change the PICC dressing as instructed by your health care provider.  Change your PICC dressing if it becomes loose or wet. WHAT SHOULD I KNOW ABOUT PICC CARE?  Check the PICC insertion site   daily for leakage, redness, swelling, or pain.  Do not take a bath, swim, or use hot tubs when you have a PICC. Cover PICC line with clear plastic wrap and tape to keep it dry while showering.  Flush the PICC as directed by your health care provider. Let your health care provider know right away if the PICC is difficult to flush or does not flush. Do not use force to flush the PICC.  Do not use a syringe that is less than 10 mL to flush the PICC.  Never pull or tug on the PICC.  Avoid blood pressure checks on the arm with the PICC.  Keep your PICC identification card with you at all  times.  Do not take the PICC out yourself. Only a trained clinical professional should remove the PICC. SEEK IMMEDIATE MEDICAL CARE IF:  Your PICC is accidentally pulled all the way out. If this happens, cover the insertion site with a bandage or gauze dressing. Do not throw the PICC away. Your health care provider will need to inspect it.  Your PICC was tugged or pulled and has partially come out. Do not  push the PICC back in.  There is any type of drainage, redness, or swelling where the PICC enters the skin.  You cannot flush the PICC, it is difficult to flush, or the PICC leaks around the insertion site when it is flushed.  You hear a "flushing" sound when the PICC is flushed.  You have pain, discomfort, or numbness in your arm, shoulder, or jaw on the same side as the PICC.  You feel your heart "racing" or skipping beats.  You notice a hole or tear in the PICC.  You develop chills or a fever. MAKE SURE YOU:   Understand these instructions.  Will watch your condition.  Will get help right away if you are not doing well or get worse.   This information is not intended to replace advice given to you by your health care provider. Make sure you discuss any questions you have with your health care provider.   Document Released: 06/04/2003 Document Revised: 12/19/2014 Document Reviewed: 08/05/2013 Elsevier Interactive Patient Education 2016 Elsevier Inc[employer]  

## 2015-11-06 ENCOUNTER — Ambulatory Visit: Payer: Self-pay

## 2015-11-06 ENCOUNTER — Ambulatory Visit (HOSPITAL_BASED_OUTPATIENT_CLINIC_OR_DEPARTMENT_OTHER): Payer: Medicaid Other

## 2015-11-06 VITALS — BP 119/84 | HR 89 | Temp 98.5°F | Resp 20

## 2015-11-06 DIAGNOSIS — C321 Malignant neoplasm of supraglottis: Secondary | ICD-10-CM | POA: Diagnosis not present

## 2015-11-06 DIAGNOSIS — Z452 Encounter for adjustment and management of vascular access device: Secondary | ICD-10-CM | POA: Diagnosis present

## 2015-11-06 DIAGNOSIS — Z95828 Presence of other vascular implants and grafts: Secondary | ICD-10-CM

## 2015-11-06 MED ORDER — SODIUM CHLORIDE 0.9 % IJ SOLN
10.0000 mL | INTRAMUSCULAR | Status: DC | PRN
  Administered 2015-11-06: 10 mL via INTRAVENOUS
  Filled 2015-11-06: qty 10

## 2015-11-06 MED ORDER — HEPARIN SOD (PORK) LOCK FLUSH 100 UNIT/ML IV SOLN
500.0000 [IU] | Freq: Once | INTRAVENOUS | Status: AC
  Administered 2015-11-06: 500 [IU] via INTRAVENOUS
  Filled 2015-11-06: qty 5

## 2015-11-06 NOTE — Patient Instructions (Signed)
PICC Home Guide A peripherally inserted central catheter (PICC) is a long, thin, flexible tube that is inserted into a vein in the upper arm. It is a form of intravenous (IV) access. It is considered to be a "central" line because the tip of the PICC ends in a large vein in your chest. This large vein is called the superior vena cava (SVC). The PICC tip ends in the SVC because there is a lot of blood flow in the SVC. This allows medicines and IV fluids to be quickly distributed throughout the body. The PICC is inserted using a sterile technique by a specially trained nurse or physician. After the PICC is inserted, a chest X-ray exam is done to be sure it is in the correct place.  A PICC may be placed for different reasons, such as:  To give medicines and liquid nutrition that can only be given through a central line. Examples are:  Certain antibiotic treatments.  Chemotherapy.  Total parenteral nutrition (TPN).  To take frequent blood samples.  To give IV fluids and blood products.  If there is difficulty placing a peripheral intravenous (PIV) catheter. If taken care of properly, a PICC can remain in place for several months. A PICC can also allow a person to go home from the hospital early. Medicine and PICC care can be managed at home by a family member or home health care team. WHAT PROBLEMS CAN HAPPEN WHEN I HAVE A PICC? Problems with a PICC can occasionally occur. These may include the following:  A blood clot (thrombus) forming in or at the tip of the PICC. This can cause the PICC to become clogged. A clot-dissolving medicine called tissue plasminogen activator (tPA) can be given through the PICC to help break up the clot.  Inflammation of the vein (phlebitis) in which the PICC is placed. Signs of inflammation may include redness, pain at the insertion site, red streaks, or being able to feel a "cord" in the vein where the PICC is located.  Infection in the PICC or at the insertion  site. Signs of infection may include fever, chills, redness, swelling, or pus drainage from the PICC insertion site.  PICC movement (malposition). The PICC tip may move from its original position due to excessive physical activity, forceful coughing, sneezing, or vomiting.  A break or cut in the PICC. It is important to not use scissors near the PICC.  Nerve or tendon irritation or injury during PICC insertion. WHAT SHOULD I KEEP IN MIND ABOUT ACTIVITIES WHEN I HAVE A PICC?  You may bend your arm and move it freely. If your PICC is near or at the bend of your elbow, avoid activity with repeated motion at the elbow.  Rest at home for the remainder of the day following PICC line insertion.  Avoid lifting heavy objects as instructed by your health care provider.  Avoid using a crutch with the arm on the same side as your PICC. You may need to use a walker. WHAT SHOULD I KNOW ABOUT MY PICC DRESSING?  Keep your PICC bandage (dressing) clean and dry to prevent infection.  Ask your health care provider when you may shower. Ask your health care provider to teach you how to wrap the PICC when you do take a shower.  Change the PICC dressing as instructed by your health care provider.  Change your PICC dressing if it becomes loose or wet. WHAT SHOULD I KNOW ABOUT PICC CARE?  Check the PICC insertion site   daily for leakage, redness, swelling, or pain.  Do not take a bath, swim, or use hot tubs when you have a PICC. Cover PICC line with clear plastic wrap and tape to keep it dry while showering.  Flush the PICC as directed by your health care provider. Let your health care provider know right away if the PICC is difficult to flush or does not flush. Do not use force to flush the PICC.  Do not use a syringe that is less than 10 mL to flush the PICC.  Never pull or tug on the PICC.  Avoid blood pressure checks on the arm with the PICC.  Keep your PICC identification card with you at all  times.  Do not take the PICC out yourself. Only a trained clinical professional should remove the PICC. SEEK IMMEDIATE MEDICAL CARE IF:  Your PICC is accidentally pulled all the way out. If this happens, cover the insertion site with a bandage or gauze dressing. Do not throw the PICC away. Your health care provider will need to inspect it.  Your PICC was tugged or pulled and has partially come out. Do not  push the PICC back in.  There is any type of drainage, redness, or swelling where the PICC enters the skin.  You cannot flush the PICC, it is difficult to flush, or the PICC leaks around the insertion site when it is flushed.  You hear a "flushing" sound when the PICC is flushed.  You have pain, discomfort, or numbness in your arm, shoulder, or jaw on the same side as the PICC.  You feel your heart "racing" or skipping beats.  You notice a hole or tear in the PICC.  You develop chills or a fever. MAKE SURE YOU:   Understand these instructions.  Will watch your condition.  Will get help right away if you are not doing well or get worse.   This information is not intended to replace advice given to you by your health care provider. Make sure you discuss any questions you have with your health care provider.   Document Released: 06/04/2003 Document Revised: 12/19/2014 Document Reviewed: 08/05/2013 Elsevier Interactive Patient Education 2016 Elsevier Inc[employer]  

## 2015-11-09 ENCOUNTER — Ambulatory Visit (HOSPITAL_BASED_OUTPATIENT_CLINIC_OR_DEPARTMENT_OTHER): Payer: Medicaid Other

## 2015-11-09 DIAGNOSIS — C321 Malignant neoplasm of supraglottis: Secondary | ICD-10-CM | POA: Diagnosis not present

## 2015-11-09 DIAGNOSIS — Z95828 Presence of other vascular implants and grafts: Secondary | ICD-10-CM

## 2015-11-09 DIAGNOSIS — Z452 Encounter for adjustment and management of vascular access device: Secondary | ICD-10-CM

## 2015-11-09 MED ORDER — HEPARIN SOD (PORK) LOCK FLUSH 100 UNIT/ML IV SOLN
500.0000 [IU] | Freq: Once | INTRAVENOUS | Status: AC
  Administered 2015-11-09: 500 [IU] via INTRAVENOUS
  Filled 2015-11-09: qty 5

## 2015-11-09 MED ORDER — SODIUM CHLORIDE 0.9 % IJ SOLN
10.0000 mL | INTRAMUSCULAR | Status: DC | PRN
  Administered 2015-11-09: 10 mL via INTRAVENOUS
  Filled 2015-11-09: qty 10

## 2015-11-09 NOTE — Patient Instructions (Signed)
PICC Home Guide A peripherally inserted central catheter (PICC) is a long, thin, flexible tube that is inserted into a vein in the upper arm. It is a form of intravenous (IV) access. It is considered to be a "central" line because the tip of the PICC ends in a large vein in your chest. This large vein is called the superior vena cava (SVC). The PICC tip ends in the SVC because there is a lot of blood flow in the SVC. This allows medicines and IV fluids to be quickly distributed throughout the body. The PICC is inserted using a sterile technique by a specially trained nurse or physician. After the PICC is inserted, a chest X-ray exam is done to be sure it is in the correct place.  A PICC may be placed for different reasons, such as:  To give medicines and liquid nutrition that can only be given through a central line. Examples are:  Certain antibiotic treatments.  Chemotherapy.  Total parenteral nutrition (TPN).  To take frequent blood samples.  To give IV fluids and blood products.  If there is difficulty placing a peripheral intravenous (PIV) catheter. If taken care of properly, a PICC can remain in place for several months. A PICC can also allow a person to go home from the hospital early. Medicine and PICC care can be managed at home by a family member or home health care team. WHAT PROBLEMS CAN HAPPEN WHEN I HAVE A PICC? Problems with a PICC can occasionally occur. These may include the following:  A blood clot (thrombus) forming in or at the tip of the PICC. This can cause the PICC to become clogged. A clot-dissolving medicine called tissue plasminogen activator (tPA) can be given through the PICC to help break up the clot.  Inflammation of the vein (phlebitis) in which the PICC is placed. Signs of inflammation may include redness, pain at the insertion site, red streaks, or being able to feel a "cord" in the vein where the PICC is located.  Infection in the PICC or at the insertion  site. Signs of infection may include fever, chills, redness, swelling, or pus drainage from the PICC insertion site.  PICC movement (malposition). The PICC tip may move from its original position due to excessive physical activity, forceful coughing, sneezing, or vomiting.  A break or cut in the PICC. It is important to not use scissors near the PICC.  Nerve or tendon irritation or injury during PICC insertion. WHAT SHOULD I KEEP IN MIND ABOUT ACTIVITIES WHEN I HAVE A PICC?  You may bend your arm and move it freely. If your PICC is near or at the bend of your elbow, avoid activity with repeated motion at the elbow.  Rest at home for the remainder of the day following PICC line insertion.  Avoid lifting heavy objects as instructed by your health care provider.  Avoid using a crutch with the arm on the same side as your PICC. You may need to use a walker. WHAT SHOULD I KNOW ABOUT MY PICC DRESSING?  Keep your PICC bandage (dressing) clean and dry to prevent infection.  Ask your health care provider when you may shower. Ask your health care provider to teach you how to wrap the PICC when you do take a shower.  Change the PICC dressing as instructed by your health care provider.  Change your PICC dressing if it becomes loose or wet. WHAT SHOULD I KNOW ABOUT PICC CARE?  Check the PICC insertion site   daily for leakage, redness, swelling, or pain.  Do not take a bath, swim, or use hot tubs when you have a PICC. Cover PICC line with clear plastic wrap and tape to keep it dry while showering.  Flush the PICC as directed by your health care provider. Let your health care provider know right away if the PICC is difficult to flush or does not flush. Do not use force to flush the PICC.  Do not use a syringe that is less than 10 mL to flush the PICC.  Never pull or tug on the PICC.  Avoid blood pressure checks on the arm with the PICC.  Keep your PICC identification card with you at all  times.  Do not take the PICC out yourself. Only a trained clinical professional should remove the PICC. SEEK IMMEDIATE MEDICAL CARE IF:  Your PICC is accidentally pulled all the way out. If this happens, cover the insertion site with a bandage or gauze dressing. Do not throw the PICC away. Your health care provider will need to inspect it.  Your PICC was tugged or pulled and has partially come out. Do not  push the PICC back in.  There is any type of drainage, redness, or swelling where the PICC enters the skin.  You cannot flush the PICC, it is difficult to flush, or the PICC leaks around the insertion site when it is flushed.  You hear a "flushing" sound when the PICC is flushed.  You have pain, discomfort, or numbness in your arm, shoulder, or jaw on the same side as the PICC.  You feel your heart "racing" or skipping beats.  You notice a hole or tear in the PICC.  You develop chills or a fever. MAKE SURE YOU:   Understand these instructions.  Will watch your condition.  Will get help right away if you are not doing well or get worse.   This information is not intended to replace advice given to you by your health care provider. Make sure you discuss any questions you have with your health care provider.   Document Released: 06/04/2003 Document Revised: 12/19/2014 Document Reviewed: 08/05/2013 Elsevier Interactive Patient Education 2016 Elsevier Inc[employer]  

## 2015-11-11 ENCOUNTER — Ambulatory Visit (HOSPITAL_BASED_OUTPATIENT_CLINIC_OR_DEPARTMENT_OTHER): Payer: Medicaid Other

## 2015-11-11 VITALS — BP 132/73 | HR 93 | Temp 98.8°F | Resp 16

## 2015-11-11 DIAGNOSIS — Z452 Encounter for adjustment and management of vascular access device: Secondary | ICD-10-CM | POA: Diagnosis not present

## 2015-11-11 DIAGNOSIS — C321 Malignant neoplasm of supraglottis: Secondary | ICD-10-CM | POA: Diagnosis not present

## 2015-11-11 MED ORDER — HEPARIN SOD (PORK) LOCK FLUSH 100 UNIT/ML IV SOLN
500.0000 [IU] | Freq: Once | INTRAVENOUS | Status: AC
  Administered 2015-11-11: 250 [IU] via INTRAVENOUS
  Filled 2015-11-11: qty 5

## 2015-11-11 MED ORDER — SODIUM CHLORIDE 0.9 % IJ SOLN
10.0000 mL | INTRAMUSCULAR | Status: DC | PRN
  Administered 2015-11-11: 10 mL via INTRAVENOUS
  Filled 2015-11-11: qty 10

## 2015-11-11 NOTE — Patient Instructions (Signed)
PICC Home Guide A peripherally inserted central catheter (PICC) is a long, thin, flexible tube that is inserted into a vein in the upper arm. It is a form of intravenous (IV) access. It is considered to be a "central" line because the tip of the PICC ends in a large vein in your chest. This large vein is called the superior vena cava (SVC). The PICC tip ends in the SVC because there is a lot of blood flow in the SVC. This allows medicines and IV fluids to be quickly distributed throughout the body. The PICC is inserted using a sterile technique by a specially trained nurse or physician. After the PICC is inserted, a chest X-ray exam is done to be sure it is in the correct place.  A PICC may be placed for different reasons, such as:  To give medicines and liquid nutrition that can only be given through a central line. Examples are:  Certain antibiotic treatments.  Chemotherapy.  Total parenteral nutrition (TPN).  To take frequent blood samples.  To give IV fluids and blood products.  If there is difficulty placing a peripheral intravenous (PIV) catheter. If taken care of properly, a PICC can remain in place for several months. A PICC can also allow a person to go home from the hospital early. Medicine and PICC care can be managed at home by a family member or home health care team. WHAT PROBLEMS CAN HAPPEN WHEN I HAVE A PICC? Problems with a PICC can occasionally occur. These may include the following:  A blood clot (thrombus) forming in or at the tip of the PICC. This can cause the PICC to become clogged. A clot-dissolving medicine called tissue plasminogen activator (tPA) can be given through the PICC to help break up the clot.  Inflammation of the vein (phlebitis) in which the PICC is placed. Signs of inflammation may include redness, pain at the insertion site, red streaks, or being able to feel a "cord" in the vein where the PICC is located.  Infection in the PICC or at the insertion  site. Signs of infection may include fever, chills, redness, swelling, or pus drainage from the PICC insertion site.  PICC movement (malposition). The PICC tip may move from its original position due to excessive physical activity, forceful coughing, sneezing, or vomiting.  A break or cut in the PICC. It is important to not use scissors near the PICC.  Nerve or tendon irritation or injury during PICC insertion. WHAT SHOULD I KEEP IN MIND ABOUT ACTIVITIES WHEN I HAVE A PICC?  You may bend your arm and move it freely. If your PICC is near or at the bend of your elbow, avoid activity with repeated motion at the elbow.  Rest at home for the remainder of the day following PICC line insertion.  Avoid lifting heavy objects as instructed by your health care provider.  Avoid using a crutch with the arm on the same side as your PICC. You may need to use a walker. WHAT SHOULD I KNOW ABOUT MY PICC DRESSING?  Keep your PICC bandage (dressing) clean and dry to prevent infection.  Ask your health care provider when you may shower. Ask your health care provider to teach you how to wrap the PICC when you do take a shower.  Change the PICC dressing as instructed by your health care provider.  Change your PICC dressing if it becomes loose or wet. WHAT SHOULD I KNOW ABOUT PICC CARE?  Check the PICC insertion site   daily for leakage, redness, swelling, or pain.  Do not take a bath, swim, or use hot tubs when you have a PICC. Cover PICC line with clear plastic wrap and tape to keep it dry while showering.  Flush the PICC as directed by your health care provider. Let your health care provider know right away if the PICC is difficult to flush or does not flush. Do not use force to flush the PICC.  Do not use a syringe that is less than 10 mL to flush the PICC.  Never pull or tug on the PICC.  Avoid blood pressure checks on the arm with the PICC.  Keep your PICC identification card with you at all  times.  Do not take the PICC out yourself. Only a trained clinical professional should remove the PICC. SEEK IMMEDIATE MEDICAL CARE IF:  Your PICC is accidentally pulled all the way out. If this happens, cover the insertion site with a bandage or gauze dressing. Do not throw the PICC away. Your health care provider will need to inspect it.  Your PICC was tugged or pulled and has partially come out. Do not  push the PICC back in.  There is any type of drainage, redness, or swelling where the PICC enters the skin.  You cannot flush the PICC, it is difficult to flush, or the PICC leaks around the insertion site when it is flushed.  You hear a "flushing" sound when the PICC is flushed.  You have pain, discomfort, or numbness in your arm, shoulder, or jaw on the same side as the PICC.  You feel your heart "racing" or skipping beats.  You notice a hole or tear in the PICC.  You develop chills or a fever. MAKE SURE YOU:   Understand these instructions.  Will watch your condition.  Will get help right away if you are not doing well or get worse.   This information is not intended to replace advice given to you by your health care provider. Make sure you discuss any questions you have with your health care provider.   Document Released: 06/04/2003 Document Revised: 12/19/2014 Document Reviewed: 08/05/2013 Elsevier Interactive Patient Education 2016 Elsevier Inc[employer]  

## 2015-11-12 ENCOUNTER — Telehealth: Payer: Self-pay | Admitting: Hematology and Oncology

## 2015-11-12 NOTE — Telephone Encounter (Signed)
returned call and s.w. pt and confirmed appt....pt ok and aware °

## 2015-11-13 ENCOUNTER — Ambulatory Visit: Payer: Self-pay

## 2015-11-13 ENCOUNTER — Other Ambulatory Visit: Payer: Self-pay | Admitting: Hematology and Oncology

## 2015-11-13 ENCOUNTER — Ambulatory Visit: Payer: Medicaid Other

## 2015-11-13 ENCOUNTER — Other Ambulatory Visit: Payer: Self-pay

## 2015-11-13 ENCOUNTER — Ambulatory Visit (HOSPITAL_BASED_OUTPATIENT_CLINIC_OR_DEPARTMENT_OTHER): Payer: Medicaid Other

## 2015-11-13 ENCOUNTER — Encounter: Payer: Self-pay | Admitting: *Deleted

## 2015-11-13 ENCOUNTER — Other Ambulatory Visit (HOSPITAL_BASED_OUTPATIENT_CLINIC_OR_DEPARTMENT_OTHER): Payer: Medicaid Other

## 2015-11-13 VITALS — BP 118/74 | HR 79 | Temp 98.2°F | Resp 18

## 2015-11-13 DIAGNOSIS — C321 Malignant neoplasm of supraglottis: Secondary | ICD-10-CM | POA: Diagnosis not present

## 2015-11-13 DIAGNOSIS — Z452 Encounter for adjustment and management of vascular access device: Secondary | ICD-10-CM

## 2015-11-13 DIAGNOSIS — Z5111 Encounter for antineoplastic chemotherapy: Secondary | ICD-10-CM

## 2015-11-13 DIAGNOSIS — Z5112 Encounter for antineoplastic immunotherapy: Secondary | ICD-10-CM | POA: Diagnosis not present

## 2015-11-13 LAB — CBC WITH DIFFERENTIAL/PLATELET
BASO%: 0.3 % (ref 0.0–2.0)
Basophils Absolute: 0 10*3/uL (ref 0.0–0.1)
EOS%: 1 % (ref 0.0–7.0)
Eosinophils Absolute: 0.1 10*3/uL (ref 0.0–0.5)
HEMATOCRIT: 38.1 % — AB (ref 38.4–49.9)
HEMOGLOBIN: 12.9 g/dL — AB (ref 13.0–17.1)
LYMPH#: 0.5 10*3/uL — AB (ref 0.9–3.3)
LYMPH%: 4.7 % — ABNORMAL LOW (ref 14.0–49.0)
MCH: 33.7 pg — ABNORMAL HIGH (ref 27.2–33.4)
MCHC: 33.9 g/dL (ref 32.0–36.0)
MCV: 99.4 fL — ABNORMAL HIGH (ref 79.3–98.0)
MONO#: 0.8 10*3/uL (ref 0.1–0.9)
MONO%: 7.7 % (ref 0.0–14.0)
NEUT#: 8.7 10*3/uL — ABNORMAL HIGH (ref 1.5–6.5)
NEUT%: 86.3 % — AB (ref 39.0–75.0)
Platelets: 278 10*3/uL (ref 140–400)
RBC: 3.84 10*6/uL — ABNORMAL LOW (ref 4.20–5.82)
RDW: 14 % (ref 11.0–14.6)
WBC: 10.1 10*3/uL (ref 4.0–10.3)

## 2015-11-13 LAB — COMPREHENSIVE METABOLIC PANEL
ALT: 11 U/L (ref 0–55)
AST: 14 U/L (ref 5–34)
Albumin: 3.4 g/dL — ABNORMAL LOW (ref 3.5–5.0)
Alkaline Phosphatase: 65 U/L (ref 40–150)
Anion Gap: 8 mEq/L (ref 3–11)
BUN: 13.7 mg/dL (ref 7.0–26.0)
CALCIUM: 9.7 mg/dL (ref 8.4–10.4)
CHLORIDE: 103 meq/L (ref 98–109)
CO2: 28 mEq/L (ref 22–29)
CREATININE: 0.8 mg/dL (ref 0.7–1.3)
EGFR: >90 mL/min/{1.73_m2} (ref >90)
GLUCOSE: 163 mg/dL — AB (ref 70–140)
Potassium: 3.6 mEq/L (ref 3.5–5.1)
Sodium: 139 mEq/L (ref 136–145)
Total Bilirubin: <0.3 mg/dL (ref 0.20–1.20)
Total Protein: 7.3 g/dL (ref 6.4–8.3)

## 2015-11-13 LAB — MAGNESIUM: MAGNESIUM: 2.2 mg/dL (ref 1.5–2.5)

## 2015-11-13 MED ORDER — SODIUM CHLORIDE 0.9 % IV SOLN
653.5000 mg | Freq: Once | INTRAVENOUS | Status: AC
  Administered 2015-11-13: 650 mg via INTRAVENOUS
  Filled 2015-11-13: qty 65

## 2015-11-13 MED ORDER — SODIUM CHLORIDE 0.9 % IV SOLN: Freq: Once | INTRAVENOUS | Status: DC

## 2015-11-13 MED ORDER — SODIUM CHLORIDE 0.9 % IJ SOLN
10.0000 mL | INTRAMUSCULAR | Status: DC | PRN
  Administered 2015-11-13: 10 mL via INTRAVENOUS
  Filled 2015-11-13: qty 10

## 2015-11-13 MED ORDER — CETUXIMAB CHEMO IV INJECTION 200 MG/100ML
400.0000 mg/m2 | Freq: Once | INTRAVENOUS | Status: AC
  Administered 2015-11-13: 700 mg via INTRAVENOUS
  Filled 2015-11-13: qty 350

## 2015-11-13 MED ORDER — HEPARIN SOD (PORK) LOCK FLUSH 100 UNIT/ML IV SOLN
250.0000 [IU] | Freq: Once | INTRAVENOUS | Status: DC | PRN
  Filled 2015-11-13: qty 5

## 2015-11-13 MED ORDER — SODIUM CHLORIDE 0.9 % IV SOLN
Freq: Once | INTRAVENOUS | Status: AC
  Administered 2015-11-13: 14:00:00 via INTRAVENOUS
  Filled 2015-11-13: qty 8

## 2015-11-13 MED ORDER — SODIUM CHLORIDE 0.9 % IJ SOLN
10.0000 mL | INTRAMUSCULAR | Status: DC | PRN
  Filled 2015-11-13: qty 10

## 2015-11-13 MED ORDER — SODIUM CHLORIDE 0.9 % IV SOLN
Freq: Once | INTRAVENOUS | Status: AC
  Administered 2015-11-13: 10:00:00 via INTRAVENOUS

## 2015-11-13 MED ORDER — SODIUM CHLORIDE 0.9 % IV SOLN
1000.0000 mg/m2/d | INTRAVENOUS | Status: DC
  Administered 2015-11-13: 7400 mg via INTRAVENOUS
  Filled 2015-11-13: qty 148

## 2015-11-13 MED ORDER — DIPHENHYDRAMINE HCL 50 MG/ML IJ SOLN
INTRAMUSCULAR | Status: AC
  Filled 2015-11-13: qty 1

## 2015-11-13 MED ORDER — DIPHENHYDRAMINE HCL 50 MG/ML IJ SOLN
50.0000 mg | Freq: Once | INTRAMUSCULAR | Status: AC
  Administered 2015-11-13: 50 mg via INTRAVENOUS

## 2015-11-13 NOTE — Progress Notes (Signed)
  Oncology Nurse Navigator Documentation   Navigator Encounter Type: Treatment (11/13/15 1015) Patient Visit Type: Medonc (11/13/15 1015) Treatment Phase: First Chemo Tx (11/13/15 1015)     Met with patient in Infusion to provide support and encouragement during his 1st chemotherapy.    Gayleen Orem, RN, BSN, [place] [facility] [phone]                 Time Spent with Patient: 15 (11/13/15 1015)

## 2015-11-13 NOTE — Patient Instructions (Signed)
[facility] Discharge Instructions for Patients Receiving Chemotherapy  Today you received the following chemotherapy agents Erbitux/Carboplatin/5 FU To help prevent nausea and vomiting after your treatment, we encourage you to take your nausea medication as prescribed.  If you develop nausea and vomiting that is not controlled by your nausea medication, call the clinic.   BELOW ARE SYMPTOMS THAT SHOULD BE REPORTED IMMEDIATELY:  *FEVER GREATER THAN 100.5 F  *CHILLS WITH OR WITHOUT FEVER  NAUSEA AND VOMITING THAT IS NOT CONTROLLED WITH YOUR NAUSEA MEDICATION  *UNUSUAL SHORTNESS OF BREATH  *UNUSUAL BRUISING OR BLEEDING  TENDERNESS IN MOUTH AND THROAT WITH OR WITHOUT PRESENCE OF ULCERS  *URINARY PROBLEMS  *BOWEL PROBLEMS  UNUSUAL RASH Items with * indicate a potential emergency and should be followed up as soon as possible.  Feel free to call the clinic you have any questions or concerns. The clinic phone number is [phone].  Please show the [place] at check-in to the Emergency Department and triage nurse.   Cetuximab injection (Erbitux) What is this medicine? CETUXIMAB (se TUX i mab) is a monoclonal antibody. It is used to treat colorectal cancer and head and neck cancer. This medicine may be used for other purposes; ask your health care provider or pharmacist if you have questions. What should I tell my health care provider before I take this medicine? They need to know if you have any of these conditions: -heart disease -history of irregular heartbeat -history of low levels of calcium, magnesium, or potassium in the blood -lung or breathing disease, like asthma -an unusual or allergic reaction to cetuximab, other medicines, foods, dyes, or preservatives -pregnant or trying to get pregnant -breast-feeding How should I use this medicine? This drug is given as an infusion into a vein. It is administered in a hospital or clinic by a specially  trained health care professional. Talk to your pediatrician regarding the use of this medicine in children. Special care may be needed. Overdosage: If you think you have taken too much of this medicine contact a poison control center or emergency room at once. NOTE: This medicine is only for you. Do not share this medicine with others. What if I miss a dose? It is important not to miss your dose. Call your doctor or health care professional if you are unable to keep an appointment. What may interact with this medicine? Interactions are not expected. This list may not describe all possible interactions. Give your health care provider a list of all the medicines, herbs, non-prescription drugs, or dietary supplements you use. Also tell them if you smoke, drink alcohol, or use illegal drugs. Some items may interact with your medicine. What should I watch for while using this medicine? Visit your doctor or health care professional for regular checks on your progress. This drug may make you feel generally unwell. This is not uncommon, as chemotherapy can affect healthy cells as well as cancer cells. Report any side effects. Continue your course of treatment even though you feel ill unless your doctor tells you to stop. This medicine can make you more sensitive to the sun. Keep out of the sun while taking this medicine and for 2 months after the last dose. If you cannot avoid being in the sun, wear protective clothing and use sunscreen. Do not use sun lamps or tanning beds/booths. You may need blood work done while you are taking this medicine. In some cases, you may be given additional medicines to help with  side effects. Follow all directions for their use. Call your doctor or health care professional for advice if you get a fever, chills or sore throat, or other symptoms of a cold or flu. Do not treat yourself. This drug decreases your body's ability to fight infections. Try to avoid being around people  who are sick. Avoid taking products that contain aspirin, acetaminophen, ibuprofen, naproxen, or ketoprofen unless instructed by your doctor. These medicines may hide a fever. Do not become pregnant while taking this medicine. Women should inform their doctor if they wish to become pregnant or think they might be pregnant. There is a potential for serious side effects to an unborn child. Use adequate birth control methods. Avoid pregnancy for at least 6 months after your last dose. Talk to your health care professional or pharmacist for more information. Do not breast-feed an infant while taking this medicine or during the 2 months after your last dose. What side effects may I notice from receiving this medicine? Side effects that you should report to your doctor or health care professional as soon as possible: -allergic reactions like skin rash, itching or hives, swelling of the face, lips, or tongue -breathing problems -changes in vision -fast, irregular heartbeat -feeling faint or lightheaded, falls -fever, chills -mouth sores -redness, blistering, peeling or loosening of the skin, including inside the mouth -trouble passing urine or change in the amount of urine -unusually weak or tired Side effects that usually do not require medical attention (report to your doctor or health care professional if they continue or are bothersome): -changes in skin like acne, cracks, skin dryness -constipation -diarrhea -headache -nail changes -nausea, vomiting -stomach upset -weight loss This list may not describe all possible side effects. Call your doctor for medical advice about side effects. You may report side effects to FDA at 1-800-FDA-1088. Where should I keep my medicine? This drug is given in a hospital or clinic and will not be stored at home. NOTE: This sheet is a summary. It may not cover all possible information. If you have questions about this medicine, talk to your doctor, pharmacist,  or health care provider.    2016, [employer]. (2015-02-04 22:27:08)   Carboplatin injection What is this medicine? CARBOPLATIN (KAR boe pla tin) is a chemotherapy drug. It targets fast dividing cells, like cancer cells, and causes these cells to die. This medicine is used to treat ovarian cancer and many other cancers. This medicine may be used for other purposes; ask your health care provider or pharmacist if you have questions. What should I tell my health care provider before I take this medicine? They need to know if you have any of these conditions: -blood disorders -hearing problems -kidney disease -recent or ongoing radiation therapy -an unusual or allergic reaction to carboplatin, cisplatin, other chemotherapy, other medicines, foods, dyes, or preservatives -pregnant or trying to get pregnant -breast-feeding How should I use this medicine? This drug is usually given as an infusion into a vein. It is administered in a hospital or clinic by a specially trained health care professional. Talk to your pediatrician regarding the use of this medicine in children. Special care may be needed. Overdosage: If you think you have taken too much of this medicine contact a poison control center or emergency room at once. NOTE: This medicine is only for you. Do not share this medicine with others. What if I miss a dose? It is important not to miss a dose. Call your doctor or health care  professional if you are unable to keep an appointment. What may interact with this medicine? -medicines for seizures -medicines to increase blood counts like filgrastim, pegfilgrastim, sargramostim -some antibiotics like amikacin, gentamicin, neomycin, streptomycin, tobramycin -vaccines Talk to your doctor or health care professional before taking any of these medicines: -acetaminophen -aspirin -ibuprofen -ketoprofen -naproxen This list may not describe all possible interactions. Give your  health care provider a list of all the medicines, herbs, non-prescription drugs, or dietary supplements you use. Also tell them if you smoke, drink alcohol, or use illegal drugs. Some items may interact with your medicine. What should I watch for while using this medicine? Your condition will be monitored carefully while you are receiving this medicine. You will need important blood work done while you are taking this medicine. This drug may make you feel generally unwell. This is not uncommon, as chemotherapy can affect healthy cells as well as cancer cells. Report any side effects. Continue your course of treatment even though you feel ill unless your doctor tells you to stop. In some cases, you may be given additional medicines to help with side effects. Follow all directions for their use. Call your doctor or health care professional for advice if you get a fever, chills or sore throat, or other symptoms of a cold or flu. Do not treat yourself. This drug decreases your body's ability to fight infections. Try to avoid being around people who are sick. This medicine may increase your risk to bruise or bleed. Call your doctor or health care professional if you notice any unusual bleeding. Be careful brushing and flossing your teeth or using a toothpick because you may get an infection or bleed more easily. If you have any dental work done, tell your dentist you are receiving this medicine. Avoid taking products that contain aspirin, acetaminophen, ibuprofen, naproxen, or ketoprofen unless instructed by your doctor. These medicines may hide a fever. Do not become pregnant while taking this medicine. Women should inform their doctor if they wish to become pregnant or think they might be pregnant. There is a potential for serious side effects to an unborn child. Talk to your health care professional or pharmacist for more information. Do not breast-feed an infant while taking this medicine. What side effects  may I notice from receiving this medicine? Side effects that you should report to your doctor or health care professional as soon as possible: -allergic reactions like skin rash, itching or hives, swelling of the face, lips, or tongue -signs of infection - fever or chills, cough, sore throat, pain or difficulty passing urine -signs of decreased platelets or bleeding - bruising, pinpoint red spots on the skin, black, tarry stools, nosebleeds -signs of decreased red blood cells - unusually weak or tired, fainting spells, lightheadedness -breathing problems -changes in hearing -changes in vision -chest pain -high blood pressure -low blood counts - This drug may decrease the number of white blood cells, red blood cells and platelets. You may be at increased risk for infections and bleeding. -nausea and vomiting -pain, swelling, redness or irritation at the injection site -pain, tingling, numbness in the hands or feet -problems with balance, talking, walking -trouble passing urine or change in the amount of urine Side effects that usually do not require medical attention (report to your doctor or health care professional if they continue or are bothersome): -hair loss -loss of appetite -metallic taste in the mouth or changes in taste This list may not describe all possible side effects. Call  your doctor for medical advice about side effects. You may report side effects to FDA at 1-800-FDA-1088. Where should I keep my medicine? This drug is given in a hospital or clinic and will not be stored at home. NOTE: This sheet is a summary. It may not cover all possible information. If you have questions about this medicine, talk to your doctor, pharmacist, or health care provider.    2016, [employer]. (2008-03-04 14:38:05)   Fluorouracil, 5-FU injection What is this medicine? FLUOROURACIL, 5-FU (flure oh YOOR a sil) is a chemotherapy drug. It slows the growth of cancer cells. This  medicine is used to treat many types of cancer like breast cancer, colon or rectal cancer, pancreatic cancer, and stomach cancer. This medicine may be used for other purposes; ask your health care provider or pharmacist if you have questions. What should I tell my health care provider before I take this medicine? They need to know if you have any of these conditions: -blood disorders -dihydropyrimidine dehydrogenase (DPD) deficiency -infection (especially a virus infection such as chickenpox, cold sores, or herpes) -kidney disease -liver disease -malnourished, poor nutrition -recent or ongoing radiation therapy -an unusual or allergic reaction to fluorouracil, other chemotherapy, other medicines, foods, dyes, or preservatives -pregnant or trying to get pregnant -breast-feeding How should I use this medicine? This drug is given as an infusion or injection into a vein. It is administered in a hospital or clinic by a specially trained health care professional. Talk to your pediatrician regarding the use of this medicine in children. Special care may be needed. Overdosage: If you think you have taken too much of this medicine contact a poison control center or emergency room at once. NOTE: This medicine is only for you. Do not share this medicine with others. What if I miss a dose? It is important not to miss your dose. Call your doctor or health care professional if you are unable to keep an appointment. What may interact with this medicine? -allopurinol -cimetidine -dapsone -digoxin -hydroxyurea -leucovorin -levamisole -medicines for seizures like ethotoin, fosphenytoin, phenytoin -medicines to increase blood counts like filgrastim, pegfilgrastim, sargramostim -medicines that treat or prevent blood clots like warfarin, enoxaparin, and dalteparin -methotrexate -metronidazole -pyrimethamine -some other chemotherapy drugs like busulfan, cisplatin, estramustine,  vinblastine -trimethoprim -trimetrexate -vaccines Talk to your doctor or health care professional before taking any of these medicines: -acetaminophen -aspirin -ibuprofen -ketoprofen -naproxen This list may not describe all possible interactions. Give your health care provider a list of all the medicines, herbs, non-prescription drugs, or dietary supplements you use. Also tell them if you smoke, drink alcohol, or use illegal drugs. Some items may interact with your medicine. What should I watch for while using this medicine? Visit your doctor for checks on your progress. This drug may make you feel generally unwell. This is not uncommon, as chemotherapy can affect healthy cells as well as cancer cells. Report any side effects. Continue your course of treatment even though you feel ill unless your doctor tells you to stop. In some cases, you may be given additional medicines to help with side effects. Follow all directions for their use. Call your doctor or health care professional for advice if you get a fever, chills or sore throat, or other symptoms of a cold or flu. Do not treat yourself. This drug decreases your body's ability to fight infections. Try to avoid being around people who are sick. This medicine may increase your risk to bruise or bleed. Call your doctor  or health care professional if you notice any unusual bleeding. Be careful brushing and flossing your teeth or using a toothpick because you may get an infection or bleed more easily. If you have any dental work done, tell your dentist you are receiving this medicine. Avoid taking products that contain aspirin, acetaminophen, ibuprofen, naproxen, or ketoprofen unless instructed by your doctor. These medicines may hide a fever. Do not become pregnant while taking this medicine. Women should inform their doctor if they wish to become pregnant or think they might be pregnant. There is a potential for serious side effects to an unborn  child. Talk to your health care professional or pharmacist for more information. Do not breast-feed an infant while taking this medicine. Men should inform their doctor if they wish to father a child. This medicine may lower sperm counts. Do not treat diarrhea with over the counter products. Contact your doctor if you have diarrhea that lasts more than 2 days or if it is severe and watery. This medicine can make you more sensitive to the sun. Keep out of the sun. If you cannot avoid being in the sun, wear protective clothing and use sunscreen. Do not use sun lamps or tanning beds/booths. What side effects may I notice from receiving this medicine? Side effects that you should report to your doctor or health care professional as soon as possible: -allergic reactions like skin rash, itching or hives, swelling of the face, lips, or tongue -low blood counts - this medicine may decrease the number of white blood cells, red blood cells and platelets. You may be at increased risk for infections and bleeding. -signs of infection - fever or chills, cough, sore throat, pain or difficulty passing urine -signs of decreased platelets or bleeding - bruising, pinpoint red spots on the skin, black, tarry stools, blood in the urine -signs of decreased red blood cells - unusually weak or tired, fainting spells, lightheadedness -breathing problems -changes in vision -chest pain -mouth sores -nausea and vomiting -pain, swelling, redness at site where injected -pain, tingling, numbness in the hands or feet -redness, swelling, or sores on hands or feet -stomach pain -unusual bleeding Side effects that usually do not require medical attention (report to your doctor or health care professional if they continue or are bothersome): -changes in finger or toe nails -diarrhea -dry or itchy skin -hair loss -headache -loss of appetite -sensitivity of eyes to the light -stomach upset -unusually teary eyes This list  may not describe all possible side effects. Call your doctor for medical advice about side effects. You may report side effects to FDA at 1-800-FDA-1088. Where should I keep my medicine? This drug is given in a hospital or clinic and will not be stored at home. NOTE: This sheet is a summary. It may not cover all possible information. If you have questions about this medicine, talk to your doctor, pharmacist, or health care provider.    2016, [employer]. (2008-04-02 13:53:16)

## 2015-11-16 ENCOUNTER — Ambulatory Visit: Payer: Medicaid Other

## 2015-11-16 ENCOUNTER — Ambulatory Visit: Payer: Medicaid Other | Attending: Radiation Oncology

## 2015-11-16 DIAGNOSIS — C321 Malignant neoplasm of supraglottis: Secondary | ICD-10-CM

## 2015-11-16 DIAGNOSIS — R1312 Dysphagia, oropharyngeal phase: Secondary | ICD-10-CM | POA: Insufficient documentation

## 2015-11-16 MED ORDER — HEPARIN SOD (PORK) LOCK FLUSH 100 UNIT/ML IV SOLN
250.0000 [IU] | INTRAVENOUS | Status: DC | PRN
Start: 2015-11-16 — End: 2015-11-16
  Filled 2015-11-16: qty 5

## 2015-11-16 MED ORDER — SODIUM CHLORIDE 0.9 % IJ SOLN
10.0000 mL | INTRAMUSCULAR | Status: DC | PRN
  Filled 2015-11-16: qty 10

## 2015-11-16 NOTE — Progress Notes (Signed)
Did not flush PICC line today due to continuous chemotherapy infusing. Pump is to come off tomorrow and be flushed. Patient verbalized understanding.

## 2015-11-16 NOTE — Patient Instructions (Signed)
Try to eat softer foods like pastas and soft meats. Stay away from high fat and acidic foods if you continue to have difficulty with heartburn/stomach acid.  Get back to doing the exercises as prescribed.

## 2015-11-16 NOTE — Therapy (Signed)
[facility] [address] [place], [place], [address] Phone: [phone]   Fax:  [phone]  Speech Language Pathology Treatment  Patient Details  Name: Kyle Alexander MRN: [id number] Date of Birth: [birth year] No Data Recorded  Encounter Date: 11/16/2015      End of Session - 11/16/15 1528    Visit Number 4   Number of Visits 8   Date for SLP Re-Evaluation 12/12/15   Authorization Type pt states he will have Medicare in January 2017   Authorization Time Period 10-01-15 to 11-25-15   Authorization - Visit Number 3   Authorization - Number of Visits 0  pt states he will have Medicare in January   SLP Start Time 1456   SLP Stop Time  1528   SLP Time Calculation (min) 32 min   Activity Tolerance Patient tolerated treatment well      Past Medical History  Diagnosis Date  . Hypertension   . Shortness of breath dyspnea   . GERD (gastroesophageal reflux disease)   . Seizures ([place])   . S/P radiation therapy 04/16/2015-05/18/2015    Laryngopharynx and bilateral neck / 50 Gy in 20 fractions to gross disease, 45 Gy in 20 fractions to high risk nodal echelons   . Heartburn symptom 10/19/2015  . Cancer ([place]) 03/16/15    left cervical lymph node / throat (2016)    Past Surgical History  Procedure Laterality Date  . Knee surgery  1998    Left  . Tonsillectomy and adenoidectomy    . Lymph node biopsy Left 03/16/15    left cervical, consistent with squamous cell carcinoma  . Hernia repair      right groin  . Tonsillectomy    . Tracheostomy tube placement N/A 03/26/2015    Procedure: TRACHEOSTOMY ;  Surgeon: [name], MD;  Location: [facility];  Service: ENT;  Laterality: N/A;  . Tooth extraction  03/26/2015    Procedure: [place];  Surgeon: [name], MD;  Location: [facility];  Service: ENT;;  . Multiple extractions with alveoloplasty N/A 03/26/2015    Procedure: EXTRACTION OF TOOTH #'S  1,2,5,6,7,8,9,10,11,12,13,18,19,21,22,23,24,25,26,27,28,29  WITH AVELOPLASTY;  Surgeon: Lenn Cal, DDS;  Location: [facility];  Service: Oral Surgery;  Laterality: N/A;  . Gastrostomy N/A 03/31/2015    Procedure: OPEN GASTROSTOMY TUBE PLACEMENT;  Surgeon: Rolm Bookbinder, MD;  Location: [facility];  Service: General;  Laterality: N/A;    There were no vitals filed for this visit.  Visit Diagnosis: Dysphagia, oropharyngeal      Subjective Assessment - 11/16/15 1502    Subjective Pt smells of tobacco smoke this afternoon. Complains of nasal drainage.   Currently in Pain? No/denies               ADULT SLP TREATMENT - 11/16/15 1506    General Information   Behavior/Cognition Alert;Cooperative;Pleasant mood   Treatment Provided   Treatment provided Dysphagia   Dysphagia Treatment   Temperature Spikes Noted No   Respiratory Status Trach  with pmsv   Oral Cavity - Dentition Edentulous   Treatment Methods Therapeutic exercise   Patient observed directly with PO's No   Other treatment/comments HEP completed with rare min A. SLP educated pt on types of food to try with his dentures - soft foods. Pt without his dentures today and stomach upset, thus no POs. SLP educated pt on how to perform    Pain Assessment   Pain Assessment No/denies pain   Assessment / Recommendations /  Plan   Plan Continue with current plan of care   Dysphagia Recommendations   Diet recommendations Thin liquid  solids as tolerated - soft foods, anti-GERD diet.   Liquids provided via Cup   Compensations Slow rate;Small sips/bites;Multiple dry swallows after each bite/sip;Follow solids with liquid;Clear throat intermittently;Effortful swallow   Postural Changes and/or Swallow Maneuvers Seated upright 90 degrees   General Recommendations   Oral Care Recommendations Oral care before and after PO   Progression Toward Goals   Progression toward goals Progressing toward goals   General Information   Reason PO's  not observed --  endentulous, stomach upset          SLP Education - 11/16/15 1528    Education provided Yes   Education Details anti-GERD foods, soft foods   Person(s) Educated Patient   Methods Explanation   Comprehension Verbalized understanding          SLP Short Term Goals - 11/16/15 1531    SLP SHORT TERM GOAL #1   Title pt will demo dysphagia HEP with rare min cues, in order to improve swallowing ability/decr aspiration risk   Baseline occasional min cues   Status Achieved   SLP SHORT TERM GOAL #2   Title pt will demo swallowing precautions to decr aspiration risk with POs   Baseline not observed today due to not completing oral care   Time 3   Period --  visits   Status On-going          SLP Long Term Goals - 11/16/15 1531    SLP LONG TERM GOAL #1   Title pt to demo HEP for dysphagia to incr ability to swallow POs safely/decr risk of aspiration with independence   Baseline occasional verbal and demo cues   Time 7   Period --  visits   Status Revised   SLP LONG TERM GOAL #2   Title pt will tell SLP signs/symptoms aspiration PNA with rare verbal cues   Baseline total A   Time 7   Period --  visits   Status On-going   SLP LONG TERM GOAL #3   Title pt will tell SLP why he is completing HEP    Baseline total A   Time 7   Period --  visits   Status On-going          Plan - 11/16/15 1530    Clinical Impression Statement Pt requires skilled ST for treatment for mild oral and moderate pharyngeal dysphagia (ICD-10: R13.12) following radiation tx for supraglottic head and neck CA (ICD-10: C32.1) Pt will need to be followed by SLP to assess proper procedure with HEP and to assess safety with PO intake. HEP was completed today with rare min A. Pt's unmet short and long term goals will be targeted for an additional 60 days from the date pt's re-certification is signed.   Speech Therapy Frequency --  approx once every 4 weeks   Duration --  8 total visits    Treatment/Interventions Aspiration precaution training;Pharyngeal strengthening exercises;Oral motor exercises;Patient/family education;Compensatory strategies;SLP instruction and feedback   Potential to Achieve Goals Good   Potential Considerations Medical prognosis;Severity of impairments   Consulted and Agree with Plan of Care Patient        Problem List Patient Active Problem List   Diagnosis Date Noted  . Nasal drainage 10/30/2015  . Gastrostomy tube in place ([facility]) 10/19/2015  . Heartburn symptom 10/19/2015  . Tracheostomy status ([facility])   . Hypomagnesemia   .  Tracheostomy care ([facility])   . 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 Vitamin D deficiency 03/17/2015  . Folate deficiency 03/16/2015  . Protein-calorie malnutrition, severe ([place]) 03/16/2015  . HTN (hypertension) 03/15/2015  . Tobacco abuse 03/15/2015  . Alcohol abuse 03/15/2015  . Alcohol withdrawal seizure ([facility]) 03/15/2015  . Prolonged Q-T interval on ECG 03/15/2015  . Aortic dilatation ([place] [facility]) 03/15/2015  . Thyroid nodule 03/15/2015  . Lymphadenopathy 03/15/2015  . Hypokalemia 03/15/2015  . B12 deficiency 03/15/2015  . Marijuana abuse 03/15/2015    [facility] , [place], CCC-SLP  11/16/2015, 3:32 PM  [facility] [address] [place], [place], [address] Phone: [phone]   Fax:  [phone]   Name: TWAIN POLLAN MRN: [id number] Date of Birth: Apr 08, 1961

## 2015-11-17 ENCOUNTER — Other Ambulatory Visit (HOSPITAL_COMMUNITY): Payer: Self-pay | Admitting: Interventional Radiology

## 2015-11-17 ENCOUNTER — Telehealth: Payer: Self-pay | Admitting: *Deleted

## 2015-11-17 ENCOUNTER — Ambulatory Visit (HOSPITAL_BASED_OUTPATIENT_CLINIC_OR_DEPARTMENT_OTHER): Payer: Medicaid Other

## 2015-11-17 DIAGNOSIS — R633 Feeding difficulties, unspecified: Secondary | ICD-10-CM

## 2015-11-17 DIAGNOSIS — C321 Malignant neoplasm of supraglottis: Secondary | ICD-10-CM | POA: Diagnosis not present

## 2015-11-17 MED ORDER — SODIUM CHLORIDE 0.9 % IJ SOLN
10.0000 mL | INTRAMUSCULAR | Status: DC | PRN
  Administered 2015-11-17: 10 mL
  Filled 2015-11-17: qty 10

## 2015-11-17 MED ORDER — HEPARIN SOD (PORK) LOCK FLUSH 100 UNIT/ML IV SOLN
500.0000 [IU] | Freq: Once | INTRAVENOUS | Status: AC | PRN
  Administered 2015-11-17: 500 [IU]
  Filled 2015-11-17: qty 5

## 2015-11-17 NOTE — Telephone Encounter (Signed)
SPoke with pt regarding treatment. Pt advised he has no concerns or questions at this time. Pt states I don't like the smell of the chemo t reminds me of a pig pen" Encouraged fluids and healthy eating, pt verbalized he will call with any concerns or questions.

## 2015-11-19 ENCOUNTER — Ambulatory Visit (HOSPITAL_COMMUNITY)
Admission: RE | Admit: 2015-11-19 | Discharge: 2015-11-19 | Disposition: A | Discharge status: Home / Self Care | Payer: Medicaid Other | Source: Ambulatory Visit | Attending: Interventional Radiology | Admitting: Interventional Radiology

## 2015-11-19 DIAGNOSIS — Y833 Surgical operation with formation of external stoma as the cause of abnormal reaction of the patient, or of later complication, without mention of misadventure at the time of the procedure: Secondary | ICD-10-CM | POA: Insufficient documentation

## 2015-11-19 DIAGNOSIS — R109 Unspecified abdominal pain: Secondary | ICD-10-CM | POA: Insufficient documentation

## 2015-11-19 DIAGNOSIS — K9429 Other complications of gastrostomy: Secondary | ICD-10-CM | POA: Insufficient documentation

## 2015-11-19 DIAGNOSIS — R633 Feeding difficulties, unspecified: Secondary | ICD-10-CM

## 2015-11-19 MED ORDER — LIDOCAINE VISCOUS 2 % MT SOLN
OROMUCOSAL | Status: AC
  Administered 2015-11-19: 10 mL
  Filled 2015-11-19: qty 15

## 2015-11-19 MED ORDER — IOHEXOL 300 MG/ML  SOLN
50.0000 mL | Freq: Once | INTRAMUSCULAR | Status: AC | PRN
  Administered 2015-11-19: 10 mL

## 2015-11-19 MED ORDER — LIDOCAINE VISCOUS 2 % MT SOLN: 15.0000 mL | Freq: Once | OROMUCOSAL | Status: DC

## 2015-11-20 ENCOUNTER — Ambulatory Visit (HOSPITAL_BASED_OUTPATIENT_CLINIC_OR_DEPARTMENT_OTHER): Payer: Medicaid Other

## 2015-11-20 ENCOUNTER — Ambulatory Visit: Payer: Self-pay

## 2015-11-20 ENCOUNTER — Other Ambulatory Visit (HOSPITAL_BASED_OUTPATIENT_CLINIC_OR_DEPARTMENT_OTHER): Payer: Medicaid Other

## 2015-11-20 ENCOUNTER — Encounter: Payer: Self-pay | Admitting: *Deleted

## 2015-11-20 ENCOUNTER — Encounter: Payer: Self-pay | Admitting: Hematology and Oncology

## 2015-11-20 ENCOUNTER — Telehealth: Payer: Self-pay | Admitting: Hematology and Oncology

## 2015-11-20 ENCOUNTER — Ambulatory Visit (HOSPITAL_BASED_OUTPATIENT_CLINIC_OR_DEPARTMENT_OTHER): Payer: Medicaid Other | Admitting: Hematology and Oncology

## 2015-11-20 ENCOUNTER — Ambulatory Visit: Payer: Medicaid Other

## 2015-11-20 VITALS — BP 106/73 | HR 92 | Temp 98.2°F | Resp 18 | Ht 70.0 in | Wt 151.1 lb

## 2015-11-20 DIAGNOSIS — J3489 Other specified disorders of nose and nasal sinuses: Secondary | ICD-10-CM

## 2015-11-20 DIAGNOSIS — C321 Malignant neoplasm of supraglottis: Secondary | ICD-10-CM

## 2015-11-20 DIAGNOSIS — Z5112 Encounter for antineoplastic immunotherapy: Secondary | ICD-10-CM | POA: Diagnosis not present

## 2015-11-20 DIAGNOSIS — Z720 Tobacco use: Secondary | ICD-10-CM

## 2015-11-20 DIAGNOSIS — Z430 Encounter for attention to tracheostomy: Secondary | ICD-10-CM

## 2015-11-20 DIAGNOSIS — R12 Heartburn: Secondary | ICD-10-CM | POA: Diagnosis not present

## 2015-11-20 DIAGNOSIS — Z931 Gastrostomy status: Secondary | ICD-10-CM

## 2015-11-20 DIAGNOSIS — C779 Secondary and unspecified malignant neoplasm of lymph node, unspecified: Secondary | ICD-10-CM | POA: Diagnosis not present

## 2015-11-20 DIAGNOSIS — Z95828 Presence of other vascular implants and grafts: Secondary | ICD-10-CM

## 2015-11-20 LAB — COMPREHENSIVE METABOLIC PANEL
ALBUMIN: 3.6 g/dL (ref 3.5–5.0)
ALK PHOS: 60 U/L (ref 40–150)
ALT: 12 U/L (ref 0–55)
AST: 15 U/L (ref 5–34)
Anion Gap: 12 mEq/L — ABNORMAL HIGH (ref 3–11)
BILIRUBIN TOTAL: 0.67 mg/dL (ref 0.20–1.20)
BUN: 15.2 mg/dL (ref 7.0–26.0)
CALCIUM: 9.7 mg/dL (ref 8.4–10.4)
CO2: 25 mEq/L (ref 22–29)
CREATININE: 0.8 mg/dL (ref 0.7–1.3)
Chloride: 101 mEq/L (ref 98–109)
EGFR: >90 mL/min/{1.73_m2} (ref >90)
Glucose: 92 mg/dl (ref 70–140)
POTASSIUM: 3.6 meq/L (ref 3.5–5.1)
Sodium: 138 mEq/L (ref 136–145)
TOTAL PROTEIN: 7.5 g/dL (ref 6.4–8.3)

## 2015-11-20 LAB — CBC WITH DIFFERENTIAL/PLATELET
BASO%: 0.2 % (ref 0.0–2.0)
Basophils Absolute: 0 10*3/uL (ref 0.0–0.1)
EOS ABS: 0.1 10*3/uL (ref 0.0–0.5)
EOS%: 1 % (ref 0.0–7.0)
HEMATOCRIT: 38 % — AB (ref 38.4–49.9)
HEMOGLOBIN: 13 g/dL (ref 13.0–17.1)
LYMPH%: 4.1 % — AB (ref 14.0–49.0)
MCH: 33.5 pg — ABNORMAL HIGH (ref 27.2–33.4)
MCHC: 34.2 g/dL (ref 32.0–36.0)
MCV: 97.9 fL (ref 79.3–98.0)
MONO#: 0.2 10*3/uL (ref 0.1–0.9)
MONO%: 1.8 % (ref 0.0–14.0)
NEUT#: 10.5 10*3/uL — ABNORMAL HIGH (ref 1.5–6.5)
NEUT%: 92.9 % — ABNORMAL HIGH (ref 39.0–75.0)
PLATELETS: 218 10*3/uL (ref 140–400)
RBC: 3.88 10*6/uL — ABNORMAL LOW (ref 4.20–5.82)
RDW: 13 % (ref 11.0–14.6)
WBC: 11.3 10*3/uL — AB (ref 4.0–10.3)
lymph#: 0.5 10*3/uL — ABNORMAL LOW (ref 0.9–3.3)

## 2015-11-20 LAB — MAGNESIUM: Magnesium: 1.7 mg/dl (ref 1.5–2.5)

## 2015-11-20 MED ORDER — OXYCODONE HCL 5 MG/5ML PO SOLN: 5.0000 mg | Freq: Four times a day (QID) | ORAL | Status: DC | PRN

## 2015-11-20 MED ORDER — SODIUM CHLORIDE 0.9 % IV SOLN
Freq: Once | INTRAVENOUS | Status: AC
  Administered 2015-11-20: 11:00:00 via INTRAVENOUS

## 2015-11-20 MED ORDER — SODIUM CHLORIDE 0.9 % IJ SOLN
10.0000 mL | INTRAMUSCULAR | Status: DC | PRN
  Administered 2015-11-20: 10 mL
  Filled 2015-11-20: qty 10

## 2015-11-20 MED ORDER — FLUTICASONE PROPIONATE 50 MCG/ACT NA SUSP: 2.0000 | Freq: Every day | NASAL | Status: DC

## 2015-11-20 MED ORDER — HEPARIN SOD (PORK) LOCK FLUSH 100 UNIT/ML IV SOLN
500.0000 [IU] | Freq: Once | INTRAVENOUS | Status: AC | PRN
  Administered 2015-11-20: 500 [IU]
  Filled 2015-11-20: qty 5

## 2015-11-20 MED ORDER — DIPHENHYDRAMINE HCL 50 MG/ML IJ SOLN
INTRAMUSCULAR | Status: AC
  Filled 2015-11-20: qty 1

## 2015-11-20 MED ORDER — DIPHENHYDRAMINE HCL 50 MG/ML IJ SOLN
50.0000 mg | Freq: Once | INTRAMUSCULAR | Status: AC
  Administered 2015-11-20: 50 mg via INTRAVENOUS

## 2015-11-20 MED ORDER — CETUXIMAB CHEMO IV INJECTION 200 MG/100ML
250.0000 mg/m2 | Freq: Once | INTRAVENOUS | Status: AC
  Administered 2015-11-20: 500 mg via INTRAVENOUS
  Filled 2015-11-20: qty 200

## 2015-11-20 MED ORDER — HEPARIN SOD (PORK) LOCK FLUSH 100 UNIT/ML IV SOLN
500.0000 [IU] | Freq: Once | INTRAVENOUS | Status: AC
  Administered 2015-11-20: 500 [IU] via INTRAVENOUS
  Filled 2015-11-20: qty 5

## 2015-11-20 MED ORDER — SODIUM CHLORIDE 0.9 % IJ SOLN
10.0000 mL | INTRAMUSCULAR | Status: DC | PRN
  Administered 2015-11-20: 10 mL via INTRAVENOUS
  Filled 2015-11-20: qty 10

## 2015-11-20 NOTE — Patient Instructions (Signed)
PICC Home Guide A peripherally inserted central catheter (PICC) is a long, thin, flexible tube that is inserted into a vein in the upper arm. It is a form of intravenous (IV) access. It is considered to be a "central" line because the tip of the PICC ends in a large vein in your chest. This large vein is called the superior vena cava (SVC). The PICC tip ends in the SVC because there is a lot of blood flow in the SVC. This allows medicines and IV fluids to be quickly distributed throughout the body. The PICC is inserted using a sterile technique by a specially trained nurse or physician. After the PICC is inserted, a chest X-ray exam is done to be sure it is in the correct place.  A PICC may be placed for different reasons, such as:  To give medicines and liquid nutrition that can only be given through a central line. Examples are:  Certain antibiotic treatments.  Chemotherapy.  Total parenteral nutrition (TPN).  To take frequent blood samples.  To give IV fluids and blood products.  If there is difficulty placing a peripheral intravenous (PIV) catheter. If taken care of properly, a PICC can remain in place for several months. A PICC can also allow a person to go home from the hospital early. Medicine and PICC care can be managed at home by a family member or home health care team. WHAT PROBLEMS CAN HAPPEN WHEN I HAVE A PICC? Problems with a PICC can occasionally occur. These may include the following:  A blood clot (thrombus) forming in or at the tip of the PICC. This can cause the PICC to become clogged. A clot-dissolving medicine called tissue plasminogen activator (tPA) can be given through the PICC to help break up the clot.  Inflammation of the vein (phlebitis) in which the PICC is placed. Signs of inflammation may include redness, pain at the insertion site, red streaks, or being able to feel a "cord" in the vein where the PICC is located.  Infection in the PICC or at the insertion  site. Signs of infection may include fever, chills, redness, swelling, or pus drainage from the PICC insertion site.  PICC movement (malposition). The PICC tip may move from its original position due to excessive physical activity, forceful coughing, sneezing, or vomiting.  A break or cut in the PICC. It is important to not use scissors near the PICC.  Nerve or tendon irritation or injury during PICC insertion. WHAT SHOULD I KEEP IN MIND ABOUT ACTIVITIES WHEN I HAVE A PICC?  You may bend your arm and move it freely. If your PICC is near or at the bend of your elbow, avoid activity with repeated motion at the elbow.  Rest at home for the remainder of the day following PICC line insertion.  Avoid lifting heavy objects as instructed by your health care provider.  Avoid using a crutch with the arm on the same side as your PICC. You may need to use a walker. WHAT SHOULD I KNOW ABOUT MY PICC DRESSING?  Keep your PICC bandage (dressing) clean and dry to prevent infection.  Ask your health care provider when you may shower. Ask your health care provider to teach you how to wrap the PICC when you do take a shower.  Change the PICC dressing as instructed by your health care provider.  Change your PICC dressing if it becomes loose or wet. WHAT SHOULD I KNOW ABOUT PICC CARE?  Check the PICC insertion site   daily for leakage, redness, swelling, or pain.  Do not take a bath, swim, or use hot tubs when you have a PICC. Cover PICC line with clear plastic wrap and tape to keep it dry while showering.  Flush the PICC as directed by your health care provider. Let your health care provider know right away if the PICC is difficult to flush or does not flush. Do not use force to flush the PICC.  Do not use a syringe that is less than 10 mL to flush the PICC.  Never pull or tug on the PICC.  Avoid blood pressure checks on the arm with the PICC.  Keep your PICC identification card with you at all  times.  Do not take the PICC out yourself. Only a trained clinical professional should remove the PICC. SEEK IMMEDIATE MEDICAL CARE IF:  Your PICC is accidentally pulled all the way out. If this happens, cover the insertion site with a bandage or gauze dressing. Do not throw the PICC away. Your health care provider will need to inspect it.  Your PICC was tugged or pulled and has partially come out. Do not  push the PICC back in.  There is any type of drainage, redness, or swelling where the PICC enters the skin.  You cannot flush the PICC, it is difficult to flush, or the PICC leaks around the insertion site when it is flushed.  You hear a "flushing" sound when the PICC is flushed.  You have pain, discomfort, or numbness in your arm, shoulder, or jaw on the same side as the PICC.  You feel your heart "racing" or skipping beats.  You notice a hole or tear in the PICC.  You develop chills or a fever. MAKE SURE YOU:   Understand these instructions.  Will watch your condition.  Will get help right away if you are not doing well or get worse.   This information is not intended to replace advice given to you by your health care provider. Make sure you discuss any questions you have with your health care provider.   Document Released: 06/04/2003 Document Revised: 12/19/2014 Document Reviewed: 08/05/2013 Elsevier Interactive Patient Education 2016 Elsevier Inc[employer]  

## 2015-11-20 NOTE — Assessment & Plan Note (Signed)
The tracheostomy site looks clean without signs of infection. He complained of pain that could be related to disease. I gave him prescription of liquid oxycodone. We discussed narcotic refill policy.

## 2015-11-20 NOTE — Patient Instructions (Signed)
[facility] Discharge Instructions for Patients Receiving Chemotherapy  Today you received the following chemotherapy agents erbitux.   If you develop nausea and vomiting that is not controlled by your nausea medication, call the clinic.   BELOW ARE SYMPTOMS THAT SHOULD BE REPORTED IMMEDIATELY:  *FEVER GREATER THAN 100.5 F  *CHILLS WITH OR WITHOUT FEVER  NAUSEA AND VOMITING THAT IS NOT CONTROLLED WITH YOUR NAUSEA MEDICATION  *UNUSUAL SHORTNESS OF BREATH  *UNUSUAL BRUISING OR BLEEDING  TENDERNESS IN MOUTH AND THROAT WITH OR WITHOUT PRESENCE OF ULCERS  *URINARY PROBLEMS  *BOWEL PROBLEMS  UNUSUAL RASH Items with * indicate a potential emergency and should be followed up as soon as possible.  Feel free to call the clinic you have any questions or concerns. The clinic phone number is [phone].  Please show the [facility] at check-in to the Emergency Department and triage nurse.

## 2015-11-20 NOTE — Assessment & Plan Note (Signed)
He tolerated cycle 1 of treatment without side effects apart from increased heartburn. I recommend we proceed with weekly Erbitux and cycle 2 of treatment on 12/04/2015. I plan for minimum 3 cycles of chemotherapy before repeat PET CT scan

## 2015-11-20 NOTE — Progress Notes (Signed)
  Oncology Nurse Navigator Documentation   Navigator Encounter Type: Clinic/MDC;6 month (11/20/15 0930) Patient Visit Type: Medonc (11/20/15 0930)   To provide support and encouragement, care continuity and to assess for needs, met with patient during est pt appt with Dr. Alvy Bimler.  Kyle Alexander is ca 89ms/p completion of RT for supraglottic cancer. He reported:  Instilling 6-8 cans nutritional supplement daily, drinking water.  CGarald Balding SLP, encouraged him to try soft foods eg pasta w/ olive oil.  Trach supplies including 2 #6 trachs delivered to his home yesterday.  He needs appt with Dr. WErik Obey  I later contacted Amy, Dr. WNoreene FilbertMA, with this request. He did not express any needs or concerns at this time, I encouraged him to contact me if that changes, he verbalized understanding.  RGayleen Orem RN, BSN, [phone]                      Time Spent with Patient: 30 (11/20/15 0930)

## 2015-11-20 NOTE — Progress Notes (Signed)
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[place])   03/15/2015 - 03/17/2015 Hospital Admission He was admitted to the hospital for evaluation of dysphagia, SOB, hemoptosis, hoarseness, 30-40 pound weight loss and worsening bilateral neck masses for 5 months   03/15/2015 Imaging Ct showed extensive circumferential malignancy in the hypopharyngeal/supraglottic region with regional LN metastases   03/16/2015 Procedure He underwent ULTRASOUND-GUIDED BIOPSY OF LEFT CERVICAL LYMPH NODES   03/16/2015 Pathology Results Accession: [id number] LN biopsy showed invasive squamous cell cancer   03/16/2015 Pathology Results Accession: [id number] showed atypical squamous cells   03/25/2015 - 04/07/2015 Hospital Admission He was admitted to the hospital and underwent tracheostomy placement, feeeding tube placement but subsequently left [facility]   03/26/2015 Surgery He had multiple extraction of tooth numbers 1, 2, 5, 6, 7, 8, 9, 10, 11, 12, 13, 18, 19, 21, 22, 23, 24, 25, 26, 27, 28, and 29. and 4 Quadrants of alveoloplasty   03/26/2015 Surgery He underwent tracheostomy   03/31/2015 Surgery He had open gastrostomy tube placement by Dr. Donne Hazel   04/16/2015 - 05/18/2015 Radiation Therapy Laryngopharynx and bilateral neck / 50 Gy in 20 fractions to gross disease, 45 Gy in 20 fractions to high risk nodal echelons  Beams/energy: Helical IMRT / 6 MV photons   04/16/2015 Procedure Fluoroscopic reposition of the 18 French gastrostomy confirmed back in the stomach,   07/03/2015 Procedure IR performed replacement of gastrostomy tube with a new 6 French balloon retention tube   10/01/2015 Imaging PEt scan showed persistent hypermetabolism within the primary  supraglottic laryngeal tumor and within right retropharyngeal, bilateral level II and left level IV cervical nodal metastases   10/28/2015 Procedure He had placement of PICC line. The IR was not able to place PORT due to suspected upper respiratory infection   11/13/2015 -  Chemotherapy He received 5FU, carboplatin chemo with weekly Erbitux   11/19/2015 Surgery Gastrostomy tube replaced.    INTERVAL HISTORY: Please see below for problem oriented charting.Marland Kitchen He is seen for toxicity review after recent chemotherapy and prior to cycle 2 of Erbitux He feels well apart from increased heartburn symptoms. He had gastrostomy tube replaced recently and complained of some mild pain. He denies recent cough fevers or chills. No mucositis. He had brief episode of diarrhea, subsequently resolve conservatively. Overall, he tolerated treatment well.  REVIEW OF SYSTEMS:   Constitutional: Denies fevers, chills or abnormal weight loss Eyes: Denies blurriness of vision Ears, nose, mouth, throat, and face: Denies mucositis or sore throat Respiratory: Denies cough, dyspnea or wheezes Cardiovascular: Denies palpitation, chest discomfort or lower extremity swelling Skin: Denies abnormal skin rashes Lymphatics: Denies new lymphadenopathy or easy bruising Neurological:Denies numbness, tingling or new weaknesses Behavioral/Psych: Mood is stable, no new changes  All other systems were reviewed with the patient and are negative.  I have reviewed the past medical history, past surgical history, social history and family history with the patient and they are unchanged from previous note.  ALLERGIES:  is allergic to aspirin.  MEDICATIONS:  Current Outpatient Prescriptions  Medication Sig Dispense Refill  . emollient (BIAFINE) cream Apply 1 application topically 2 (two) times daily.    . fluticasone (FLONASE) 50 MCG/ACT nasal spray Place 2 sprays into both nostrils daily. [place]  g 2  . ibuprofen (ADVIL,MOTRIN) 200 MG  tablet Take 200 mg by mouth every 6 (six) hours as needed for mild pain.    Marland Kitchen levothyroxine (LEVOTHROID) 25 MCG tablet Take 1 tablet (25 mcg total) by mouth daily before breakfast. 30 tablet 5  . lidocaine-prilocaine (EMLA) cream Apply 1 application topically as needed. 30 g 0  . LORazepam (ATIVAN) 0.5 MG tablet Take 1-2 tablets 30 min before radiotherapy for anxiety. 25 tablet 0  . metoCLOPramide (REGLAN) 5 MG/5ML solution Place 5 mLs (5 mg total) into feeding tube 4 (four) times daily -  before meals and at bedtime. 120 mL 0  . nicotine (NICODERM CQ) 7 mg/24hr patch apply 7 mg patch daily x 4 wks after quitting smoking 14 patch 1  . Nutritional Supplements (FEEDING SUPPLEMENT, OSMOLITE 1.2 CAL,) LIQD Give 2 cans osmolite 1.2 QID with 60 cc free water before and after bolus feeding.  Give an additional 240 cc free water via PEG daily. 1896 mL 0  . omeprazole (PRILOSEC) 20 MG capsule Take 1 capsule by mouth daily.  6  . oxyCODONE (ROXICODONE) 5 MG/5ML solution Take 5 mLs (5 mg total) by mouth every 6 (six) hours as needed. 473 mL 0  . oxymetazoline (AFRIN) 0.05 % nasal spray Place 1 spray into both nostrils as needed for congestion.    . polyethylene glycol (MIRALAX) packet Take 17 g by mouth daily. 14 each 0  . Sodium Chloride Flush (NORMAL SALINE FLUSH) 0.9 % SOLN Inject 10 mLs into the vein daily. 30 Syringe 0  . sodium chloride irrigation 0.9 % irrigation Irrigate with 1,000 mLs as directed daily. Irrigate Feeding tube as directed daily 1000 mL 11   No current facility-administered medications for this visit.   Facility-Administered Medications Ordered in Other Visits  Medication Dose Route Frequency Provider Last Rate Last Dose  . sodium chloride 0.9 % injection 10 mL  10 mL Intravenous PRN [name], MD   10 mL at 11/20/15 0853    PHYSICAL EXAMINATION: ECOG PERFORMANCE STATUS: 1 - Symptomatic but completely ambulatory  Filed Vitals:   11/20/15 0914  BP: 106/73  Pulse: 92  Temp:  98.2 F (36.8 C)  Resp: 18   Filed Weights   11/20/15 0914  Weight: 151 lb 1.6 oz (68.539 kg)    GENERAL:alert, no distress and comfortable SKIN: skin color, texture, turgor are normal, no rashes or significant lesions EYES: normal, Conjunctiva are pink and non-injected, sclera clear OROPHARYNX:no exudate, no erythema and lips, buccal mucosa, and tongue normal  NECK: Tracheostomy looks okay.  LYMPH:  no palpable lymphadenopathy in the cervical, axillary or inguinal LUNGS: clear to auscultation and percussion with normal breathing effort HEART: regular rate & rhythm and no murmurs and no lower extremity edema ABDOMEN:abdomen soft, non-tender and normal bowel sounds. Feeding tube site looks okay Musculoskeletal:no cyanosis of digits and no clubbing  NEURO: alert & oriented x 3 with fluent speech, no focal motor/sensory deficits  LABORATORY DATA:  I have reviewed the data as listed    Component Value Date/Time   NA 138 11/20/2015 0842   NA 137 10/28/2015 0800   K 3.6 11/20/2015 0842   K 3.9 10/28/2015 0800   CL 97* 10/28/2015 0800   CO2 25 11/20/2015 0842   CO2 32 10/28/2015 0800   GLUCOSE 92 11/20/2015 0842   GLUCOSE 96 10/28/2015 0800   BUN 15.2 11/20/2015 0842   BUN 14 10/28/2015 0800   CREATININE 0.8 11/20/2015 0842   CREATININE  0.74 10/28/2015 0800   CALCIUM 9.7 11/20/2015 0842   CALCIUM 9.6 10/28/2015 0800   PROT 7.5 11/20/2015 0842   PROT 8.2* 10/28/2015 0800   ALBUMIN 3.6 11/20/2015 0842   ALBUMIN 4.0 10/28/2015 0800   AST 15 11/20/2015 0842   AST 19 10/28/2015 0800   ALT 12 11/20/2015 0842   ALT 16* 10/28/2015 0800   ALKPHOS 60 11/20/2015 0842   ALKPHOS 65 10/28/2015 0800   BILITOT 0.67 11/20/2015 0842   BILITOT 0.1* 10/28/2015 0800   GFRNONAA >60 10/28/2015 0800   GFRAA >60 10/28/2015 0800    No results found for: SPEP, UPEP  Lab Results  Component Value Date   WBC 11.3* 11/20/2015   NEUTROABS 10.5* 11/20/2015   HGB 13.0 11/20/2015   HCT 38.0*  11/20/2015   MCV 97.9 11/20/2015   PLT 218 11/20/2015      Chemistry      Component Value Date/Time   NA 138 11/20/2015 0842   NA 137 10/28/2015 0800   K 3.6 11/20/2015 0842   K 3.9 10/28/2015 0800   CL 97* 10/28/2015 0800   CO2 25 11/20/2015 0842   CO2 32 10/28/2015 0800   BUN 15.2 11/20/2015 0842   BUN 14 10/28/2015 0800   CREATININE 0.8 11/20/2015 0842   CREATININE 0.74 10/28/2015 0800      Component Value Date/Time   CALCIUM 9.7 11/20/2015 0842   CALCIUM 9.6 10/28/2015 0800   ALKPHOS 60 11/20/2015 0842   ALKPHOS 65 10/28/2015 0800   AST 15 11/20/2015 0842   AST 19 10/28/2015 0800   ALT 12 11/20/2015 0842   ALT 16* 10/28/2015 0800   BILITOT 0.67 11/20/2015 0842   BILITOT 0.1* 10/28/2015 0800       RADIOGRAPHIC STUDIES: I have personally reviewed the radiological images as listed and agreed with the findings in the report. Ir Replc Gastro/colonic Tube Percut W/fluoro  11/19/2015  CLINICAL DATA:  Surgically placed gastrostomy catheter which has been exchanged percutaneously x2. Abdominal pain associated with gastrostomy catheter. EXAM: GASTROSTOMY CATHETER REPLACEMENT FLUOROSCOPY TIME:  0.3 minutes, 81  uGym2 DAP TECHNIQUE: The procedure, risks, benefits, and alternatives were explained to the patient. Questions regarding the procedure were encouraged and answered. The patient understands and consents to the procedure. No preprocedural antibiotics were required for this case. The preexisting catheter was injected with contrast material under fluoroscopy. It was then removed. A new 45 French balloon retention gastrostomy tube was advanced through the percutaneous tract. The retention balloon was inflated with 8 mL of saline. Final catheter position was confirmed with a fluoroscopic spot image obtained after injection of contrast. COMPLICATIONS: None. FINDINGS: Current gastrostomy tube is positioned in the distal body of the stomach. The retention balloon is opacified by diluted  contrast material. Due to symptoms of discomfort, it was decided to exchange the catheter for a new catheter. The new catheter tip is positioned in the body of the stomach. IMPRESSION: Replacement of gastrostomy tube with a new 34 French balloon retention tube. The tip is located in the body of the stomach in the catheter may be used immediately. Electronically Signed   By: Lucrezia Europe M.D.   On: 11/19/2015 14:31     ASSESSMENT & PLAN:  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[facility]) He tolerated cycle 1 of treatment without side effects apart from increased heartburn. I recommend we proceed with weekly Erbitux and cycle 2 of treatment on 12/04/2015. I plan for minimum 3 cycles of chemotherapy before repeat PET CT scan  Tracheostomy care [facility])  The tracheostomy site looks clean without signs of infection. He complained of pain that could be related to disease. I gave him prescription of liquid oxycodone. We discussed narcotic refill policy.   Gastrostomy tube in place [facility]) The feeding tube was causing pain. It was replaced recently and appears to be functioning well.  Heartburn symptom  He has symptoms of heartburn and constipation. I recommend use of Reglan before meals that should help increase gastric motility and relieve heartburn and possibly treat his constipation    Tobacco abuse His currently using a nicotine patch. The patient claimed he has not smoked for 2 days. I continue to encourage him to quit smoking.   No orders of the defined types were placed in this encounter.   All questions were answered. The patient knows to call the clinic with any problems, questions or concerns. No barriers to learning was detected. I spent 25 minutes counseling the patient face to face. The total time spent in the appointment was 30 minutes and more than 50% was on counseling and review of test results     [facility], [place], MD 11/20/2015 10:34 AM

## 2015-11-20 NOTE — Assessment & Plan Note (Signed)
He has symptoms of heartburn and constipation. I recommend use of Reglan before meals that should help increase gastric motility and relieve heartburn and possibly treat his constipation 

## 2015-11-20 NOTE — Telephone Encounter (Signed)
Gave and pirnted appt sched and avs for pt for Dec and Jan

## 2015-11-20 NOTE — Assessment & Plan Note (Signed)
The feeding tube was causing pain. It was replaced recently and appears to be functioning well.

## 2015-11-20 NOTE — Assessment & Plan Note (Signed)
His currently using a nicotine patch. The patient claimed he has not smoked for 2 days. I continue to encourage him to quit smoking.

## 2015-11-24 ENCOUNTER — Other Ambulatory Visit: Payer: Self-pay

## 2015-11-24 ENCOUNTER — Telehealth: Payer: Self-pay | Admitting: Hematology and Oncology

## 2015-11-24 ENCOUNTER — Other Ambulatory Visit: Payer: Self-pay | Admitting: Hematology and Oncology

## 2015-11-24 ENCOUNTER — Other Ambulatory Visit: Payer: Self-pay | Admitting: *Deleted

## 2015-11-24 DIAGNOSIS — C321 Malignant neoplasm of supraglottis: Secondary | ICD-10-CM

## 2015-11-24 NOTE — Telephone Encounter (Signed)
Called patient and he is aware of his picc flush

## 2015-11-25 ENCOUNTER — Other Ambulatory Visit: Payer: Self-pay | Admitting: *Deleted

## 2015-11-25 ENCOUNTER — Ambulatory Visit (HOSPITAL_BASED_OUTPATIENT_CLINIC_OR_DEPARTMENT_OTHER): Payer: Medicaid Other

## 2015-11-25 VITALS — BP 140/75 | HR 77 | Temp 98.6°F | Resp 18

## 2015-11-25 DIAGNOSIS — C321 Malignant neoplasm of supraglottis: Secondary | ICD-10-CM | POA: Diagnosis not present

## 2015-11-25 DIAGNOSIS — Z452 Encounter for adjustment and management of vascular access device: Secondary | ICD-10-CM

## 2015-11-25 DIAGNOSIS — Z95828 Presence of other vascular implants and grafts: Secondary | ICD-10-CM

## 2015-11-25 MED ORDER — SODIUM CHLORIDE 0.9 % IJ SOLN
10.0000 mL | INTRAMUSCULAR | Status: DC | PRN
  Administered 2015-11-25: 10 mL via INTRAVENOUS
  Filled 2015-11-25: qty 10

## 2015-11-25 MED ORDER — HEPARIN SOD (PORK) LOCK FLUSH 100 UNIT/ML IV SOLN
500.0000 [IU] | Freq: Once | INTRAVENOUS | Status: AC
  Administered 2015-11-25: 500 [IU] via INTRAVENOUS
  Filled 2015-11-25: qty 5

## 2015-11-27 ENCOUNTER — Ambulatory Visit: Payer: Self-pay

## 2015-11-27 ENCOUNTER — Other Ambulatory Visit: Payer: Self-pay | Admitting: Radiology

## 2015-11-27 ENCOUNTER — Other Ambulatory Visit (HOSPITAL_BASED_OUTPATIENT_CLINIC_OR_DEPARTMENT_OTHER): Payer: Medicaid Other

## 2015-11-27 ENCOUNTER — Telehealth: Payer: Self-pay | Admitting: *Deleted

## 2015-11-27 ENCOUNTER — Ambulatory Visit: Payer: Medicaid Other

## 2015-11-27 ENCOUNTER — Ambulatory Visit (HOSPITAL_BASED_OUTPATIENT_CLINIC_OR_DEPARTMENT_OTHER): Payer: Medicaid Other

## 2015-11-27 VITALS — BP 111/71 | HR 69 | Temp 98.7°F | Resp 18

## 2015-11-27 DIAGNOSIS — E038 Other specified hypothyroidism: Secondary | ICD-10-CM

## 2015-11-27 DIAGNOSIS — Z452 Encounter for adjustment and management of vascular access device: Secondary | ICD-10-CM

## 2015-11-27 DIAGNOSIS — C321 Malignant neoplasm of supraglottis: Secondary | ICD-10-CM

## 2015-11-27 DIAGNOSIS — Z79899 Other long term (current) drug therapy: Secondary | ICD-10-CM | POA: Diagnosis not present

## 2015-11-27 DIAGNOSIS — Z5112 Encounter for antineoplastic immunotherapy: Secondary | ICD-10-CM

## 2015-11-27 LAB — COMPREHENSIVE METABOLIC PANEL
ALBUMIN: 3.4 g/dL — AB (ref 3.5–5.0)
ALK PHOS: 63 U/L (ref 40–150)
ALT: 16 U/L (ref 0–55)
ANION GAP: 9 meq/L (ref 3–11)
AST: 16 U/L (ref 5–34)
BUN: 10.1 mg/dL (ref 7.0–26.0)
CO2: 29 mEq/L (ref 22–29)
CREATININE: 0.7 mg/dL (ref 0.7–1.3)
Calcium: 9.3 mg/dL (ref 8.4–10.4)
Chloride: 102 mEq/L (ref 98–109)
GLUCOSE: 92 mg/dL (ref 70–140)
Potassium: 3.6 mEq/L (ref 3.5–5.1)
SODIUM: 141 meq/L (ref 136–145)
TOTAL PROTEIN: 7 g/dL (ref 6.4–8.3)

## 2015-11-27 LAB — MAGNESIUM: Magnesium: 2.1 mg/dl (ref 1.5–2.5)

## 2015-11-27 LAB — CBC WITH DIFFERENTIAL/PLATELET
BASO%: 0.3 % (ref 0.0–2.0)
BASOS ABS: 0 10*3/uL (ref 0.0–0.1)
EOS ABS: 0.1 10*3/uL (ref 0.0–0.5)
EOS%: 2.9 % (ref 0.0–7.0)
HEMATOCRIT: 33.4 % — AB (ref 38.4–49.9)
HEMOGLOBIN: 11.1 g/dL — AB (ref 13.0–17.1)
LYMPH#: 0.5 10*3/uL — AB (ref 0.9–3.3)
LYMPH%: 14.5 % (ref 14.0–49.0)
MCH: 33.1 pg (ref 27.2–33.4)
MCHC: 33.2 g/dL (ref 32.0–36.0)
MCV: 99.7 fL — AB (ref 79.3–98.0)
MONO#: 0.4 10*3/uL (ref 0.1–0.9)
MONO%: 12.8 % (ref 0.0–14.0)
NEUT#: 2.3 10*3/uL (ref 1.5–6.5)
NEUT%: 69.5 % (ref 39.0–75.0)
Platelets: 83 10*3/uL — ABNORMAL LOW (ref 140–400)
RBC: 3.35 10*6/uL — ABNORMAL LOW (ref 4.20–5.82)
RDW: 13 % (ref 11.0–14.6)
WBC: 3.3 10*3/uL — ABNORMAL LOW (ref 4.0–10.3)

## 2015-11-27 LAB — TSH: TSH: 6.256 m[IU]/L — AB (ref 0.320–4.118)

## 2015-11-27 MED ORDER — LEVOTHYROXINE SODIUM 25 MCG PO TABS: 25.0000 ug | ORAL_TABLET | Freq: Every day | ORAL | Status: DC

## 2015-11-27 MED ORDER — DIPHENHYDRAMINE HCL 50 MG/ML IJ SOLN
50.0000 mg | Freq: Once | INTRAMUSCULAR | Status: AC
  Administered 2015-11-27: 50 mg via INTRAVENOUS

## 2015-11-27 MED ORDER — DIPHENHYDRAMINE HCL 50 MG/ML IJ SOLN
INTRAMUSCULAR | Status: AC
  Filled 2015-11-27: qty 1

## 2015-11-27 MED ORDER — SODIUM CHLORIDE 0.9 % IJ SOLN
10.0000 mL | INTRAMUSCULAR | Status: DC | PRN
  Administered 2015-11-27: 10 mL
  Filled 2015-11-27: qty 10

## 2015-11-27 MED ORDER — CETUXIMAB CHEMO IV INJECTION 200 MG/100ML
250.0000 mg/m2 | Freq: Once | INTRAVENOUS | Status: AC
  Administered 2015-11-27: 500 mg via INTRAVENOUS
  Filled 2015-11-27: qty 200

## 2015-11-27 MED ORDER — SODIUM CHLORIDE 0.9 % IJ SOLN
10.0000 mL | INTRAMUSCULAR | Status: DC | PRN
  Administered 2015-11-27: 10 mL via INTRAVENOUS
  Filled 2015-11-27: qty 10

## 2015-11-27 MED ORDER — SODIUM CHLORIDE 0.9 % IV SOLN
Freq: Once | INTRAVENOUS | Status: AC
  Administered 2015-11-27: 09:00:00 via INTRAVENOUS

## 2015-11-27 MED ORDER — HEPARIN SOD (PORK) LOCK FLUSH 100 UNIT/ML IV SOLN
250.0000 [IU] | Freq: Once | INTRAVENOUS | Status: AC | PRN
  Administered 2015-11-27: 250 [IU]
  Filled 2015-11-27: qty 5

## 2015-11-27 NOTE — Progress Notes (Signed)
WBCs 3.3, Platelets 83, reviewed labs with Dr. Alvy Bimler, okay to proceed with treatment. Also clarified orders to remove PICC line today, Per Dr. Alvy Bimler nurse [place] who spoke with IR. PICC will stay in place until Monday and IR will remove PICC after Port is placed. Pt aware and verbalizes understanding.  Pt monitored 1 hr post infusion. Pt and VS stable at time of discharge.

## 2015-11-27 NOTE — Patient Instructions (Signed)
[facility] Discharge Instructions for Patients Receiving Chemotherapy  Today you received the following chemotherapy agents: Erbitux   To help prevent nausea and vomiting after your treatment, we encourage you to take your nausea medication as directed.    If you develop nausea and vomiting that is not controlled by your nausea medication, call the clinic.   BELOW ARE SYMPTOMS THAT SHOULD BE REPORTED IMMEDIATELY:  *FEVER GREATER THAN 100.5 F  *CHILLS WITH OR WITHOUT FEVER  NAUSEA AND VOMITING THAT IS NOT CONTROLLED WITH YOUR NAUSEA MEDICATION  *UNUSUAL SHORTNESS OF BREATH  *UNUSUAL BRUISING OR BLEEDING  TENDERNESS IN MOUTH AND THROAT WITH OR WITHOUT PRESENCE OF ULCERS  *URINARY PROBLEMS  *BOWEL PROBLEMS  UNUSUAL RASH Items with * indicate a potential emergency and should be followed up as soon as possible.  Feel free to call the clinic you have any questions or concerns. The clinic phone number is [phone].  Please show the CHEMO ALERT CARD at check-in to the Emergency Department and triage nurse.   

## 2015-11-27 NOTE — Telephone Encounter (Signed)
-----   Message from [name], MD sent at 11/27/2015  9:54 AM EST ----- Regarding: high TSH Is he taking his thyroid supplement? If yes, increase to 50 mcg If not, reinforce compliance ----- Message -----    From: [facility]    Sent: 11/27/2015   8:17 AM      To: [name], MD

## 2015-11-27 NOTE — Patient Instructions (Signed)
PICC Home Guide A peripherally inserted central catheter (PICC) is a long, thin, flexible tube that is inserted into a vein in the upper arm. It is a form of intravenous (IV) access. It is considered to be a "central" line because the tip of the PICC ends in a large vein in your chest. This large vein is called the superior vena cava (SVC). The PICC tip ends in the SVC because there is a lot of blood flow in the SVC. This allows medicines and IV fluids to be quickly distributed throughout the body. The PICC is inserted using a sterile technique by a specially trained nurse or physician. After the PICC is inserted, a chest X-ray exam is done to be sure it is in the correct place.  A PICC may be placed for different reasons, such as:  To give medicines and liquid nutrition that can only be given through a central line. Examples are:  Certain antibiotic treatments.  Chemotherapy.  Total parenteral nutrition (TPN).  To take frequent blood samples.  To give IV fluids and blood products.  If there is difficulty placing a peripheral intravenous (PIV) catheter. If taken care of properly, a PICC can remain in place for several months. A PICC can also allow a person to go home from the hospital early. Medicine and PICC care can be managed at home by a family member or home health care team. WHAT PROBLEMS CAN HAPPEN WHEN I HAVE A PICC? Problems with a PICC can occasionally occur. These may include the following:  A blood clot (thrombus) forming in or at the tip of the PICC. This can cause the PICC to become clogged. A clot-dissolving medicine called tissue plasminogen activator (tPA) can be given through the PICC to help break up the clot.  Inflammation of the vein (phlebitis) in which the PICC is placed. Signs of inflammation may include redness, pain at the insertion site, red streaks, or being able to feel a "cord" in the vein where the PICC is located.  Infection in the PICC or at the insertion  site. Signs of infection may include fever, chills, redness, swelling, or pus drainage from the PICC insertion site.  PICC movement (malposition). The PICC tip may move from its original position due to excessive physical activity, forceful coughing, sneezing, or vomiting.  A break or cut in the PICC. It is important to not use scissors near the PICC.  Nerve or tendon irritation or injury during PICC insertion. WHAT SHOULD I KEEP IN MIND ABOUT ACTIVITIES WHEN I HAVE A PICC?  You may bend your arm and move it freely. If your PICC is near or at the bend of your elbow, avoid activity with repeated motion at the elbow.  Rest at home for the remainder of the day following PICC line insertion.  Avoid lifting heavy objects as instructed by your health care provider.  Avoid using a crutch with the arm on the same side as your PICC. You may need to use a walker. WHAT SHOULD I KNOW ABOUT MY PICC DRESSING?  Keep your PICC bandage (dressing) clean and dry to prevent infection.  Ask your health care provider when you may shower. Ask your health care provider to teach you how to wrap the PICC when you do take a shower.  Change the PICC dressing as instructed by your health care provider.  Change your PICC dressing if it becomes loose or wet. WHAT SHOULD I KNOW ABOUT PICC CARE?  Check the PICC insertion site   daily for leakage, redness, swelling, or pain.  Do not take a bath, swim, or use hot tubs when you have a PICC. Cover PICC line with clear plastic wrap and tape to keep it dry while showering.  Flush the PICC as directed by your health care provider. Let your health care provider know right away if the PICC is difficult to flush or does not flush. Do not use force to flush the PICC.  Do not use a syringe that is less than 10 mL to flush the PICC.  Never pull or tug on the PICC.  Avoid blood pressure checks on the arm with the PICC.  Keep your PICC identification card with you at all  times.  Do not take the PICC out yourself. Only a trained clinical professional should remove the PICC. SEEK IMMEDIATE MEDICAL CARE IF:  Your PICC is accidentally pulled all the way out. If this happens, cover the insertion site with a bandage or gauze dressing. Do not throw the PICC away. Your health care provider will need to inspect it.  Your PICC was tugged or pulled and has partially come out. Do not  push the PICC back in.  There is any type of drainage, redness, or swelling where the PICC enters the skin.  You cannot flush the PICC, it is difficult to flush, or the PICC leaks around the insertion site when it is flushed.  You hear a "flushing" sound when the PICC is flushed.  You have pain, discomfort, or numbness in your arm, shoulder, or jaw on the same side as the PICC.  You feel your heart "racing" or skipping beats.  You notice a hole or tear in the PICC.  You develop chills or a fever. MAKE SURE YOU:   Understand these instructions.  Will watch your condition.  Will get help right away if you are not doing well or get worse.   This information is not intended to replace advice given to you by your health care provider. Make sure you discuss any questions you have with your health care provider.   Document Released: 06/04/2003 Document Revised: 12/19/2014 Document Reviewed: 08/05/2013 Elsevier Interactive Patient Education 2016 Elsevier Inc[employer]  

## 2015-11-27 NOTE — Telephone Encounter (Signed)
Pt states ran out of his Levothyroxine a week ago.  He was waiting to see Dr. Isidore Moos next week to see if he has to continue it.   Instructed pt he will need to take for the rest of his life and his thyroid level will need to be monitored periodically.   Sent refill 25 mcg to [place] and instructed to resume daily.   He verbalized understanding.

## 2015-11-30 ENCOUNTER — Other Ambulatory Visit: Payer: Self-pay | Admitting: Hematology and Oncology

## 2015-11-30 ENCOUNTER — Encounter: Payer: Self-pay | Admitting: *Deleted

## 2015-11-30 ENCOUNTER — Ambulatory Visit (HOSPITAL_COMMUNITY)
Admission: RE | Admit: 2015-11-30 | Discharge: 2015-11-30 | Disposition: A | Discharge status: Home / Self Care | Payer: Medicaid Other | Source: Ambulatory Visit | Attending: Hematology and Oncology | Admitting: Hematology and Oncology

## 2015-11-30 ENCOUNTER — Encounter (HOSPITAL_COMMUNITY): Payer: Self-pay

## 2015-11-30 DIAGNOSIS — C321 Malignant neoplasm of supraglottis: Secondary | ICD-10-CM

## 2015-11-30 DIAGNOSIS — Z7951 Long term (current) use of inhaled steroids: Secondary | ICD-10-CM | POA: Insufficient documentation

## 2015-11-30 LAB — CBC WITH DIFFERENTIAL/PLATELET
BASOS PCT: 0 %
Basophils Absolute: 0 10*3/uL (ref 0.0–0.1)
EOS ABS: 0.1 10*3/uL (ref 0.0–0.7)
Eosinophils Relative: 2 %
HEMATOCRIT: 32 % — AB (ref 39.0–52.0)
HEMOGLOBIN: 11 g/dL — AB (ref 13.0–17.0)
LYMPHS ABS: 0.5 10*3/uL — AB (ref 0.7–4.0)
Lymphocytes Relative: 19 %
MCH: 33.7 pg (ref 26.0–34.0)
MCHC: 34.4 g/dL (ref 30.0–36.0)
MCV: 98.2 fL (ref 78.0–100.0)
MONO ABS: 0.3 10*3/uL (ref 0.1–1.0)
MONOS PCT: 10 %
NEUTROS ABS: 1.7 10*3/uL (ref 1.7–7.7)
NEUTROS PCT: 68 %
Platelets: 54 10*3/uL — ABNORMAL LOW (ref 150–400)
RBC: 3.26 MIL/uL — ABNORMAL LOW (ref 4.22–5.81)
RDW: 12.8 % (ref 11.5–15.5)
WBC: 2.6 10*3/uL — ABNORMAL LOW (ref 4.0–10.5)

## 2015-11-30 LAB — PROTIME-INR
INR: 1.07 (ref 0.00–1.49)
Prothrombin Time: 14.1 seconds (ref 11.6–15.2)

## 2015-11-30 MED ORDER — CEFAZOLIN SODIUM-DEXTROSE 2-3 GM-% IV SOLR
2.0000 g | Freq: Three times a day (TID) | INTRAVENOUS | Status: DC
  Administered 2015-11-30: 2 g via INTRAVENOUS

## 2015-11-30 MED ORDER — LIDOCAINE HCL 1 % IJ SOLN
INTRAMUSCULAR | Status: AC
  Filled 2015-11-30: qty 20

## 2015-11-30 MED ORDER — SODIUM CHLORIDE 0.9 % IV SOLN
INTRAVENOUS | Status: DC
  Administered 2015-11-30: 13:00:00 via INTRAVENOUS

## 2015-11-30 MED ORDER — HEPARIN SOD (PORK) LOCK FLUSH 100 UNIT/ML IV SOLN
INTRAVENOUS | Status: AC | PRN
  Administered 2015-11-30: 500 [IU] via INTRAVENOUS

## 2015-11-30 MED ORDER — MIDAZOLAM HCL 2 MG/2ML IJ SOLN
INTRAMUSCULAR | Status: AC
  Filled 2015-11-30: qty 6

## 2015-11-30 MED ORDER — FENTANYL CITRATE (PF) 100 MCG/2ML IJ SOLN
INTRAMUSCULAR | Status: AC | PRN
  Administered 2015-11-30: 50 ug via INTRAVENOUS

## 2015-11-30 MED ORDER — FENTANYL CITRATE (PF) 100 MCG/2ML IJ SOLN
INTRAMUSCULAR | Status: AC
  Filled 2015-11-30: qty 4

## 2015-11-30 MED ORDER — MIDAZOLAM HCL 2 MG/2ML IJ SOLN
INTRAMUSCULAR | Status: AC | PRN
  Administered 2015-11-30 (×3): 1 mg via INTRAVENOUS

## 2015-11-30 MED ORDER — HEPARIN SOD (PORK) LOCK FLUSH 100 UNIT/ML IV SOLN
INTRAVENOUS | Status: AC
Start: 2015-11-30 — End: 2015-11-30
  Filled 2015-11-30: qty 5

## 2015-11-30 MED ORDER — LIDOCAINE-EPINEPHRINE 2 %-1:100000 IJ SOLN
INTRAMUSCULAR | Status: AC
  Filled 2015-11-30: qty 1

## 2015-11-30 MED ORDER — CEFAZOLIN SODIUM-DEXTROSE 2-3 GM-% IV SOLR
INTRAVENOUS | Status: AC
Start: 2015-11-30 — End: 2015-11-30
  Filled 2015-11-30: qty 50

## 2015-11-30 MED ORDER — CEFAZOLIN SODIUM-DEXTROSE 2-3 GM-% IV SOLR: 2.0000 g | Freq: Once | INTRAVENOUS | Status: AC

## 2015-11-30 NOTE — Procedures (Signed)
Placement of right jugular portacath.  Tip at SVC/RA junction and ready to use.

## 2015-11-30 NOTE — Progress Notes (Signed)
  Oncology Nurse Navigator Documentation   Navigator Encounter Type: Other (11/30/15 1251) Patient Visit Type: Surgery (11/30/15 1251)     To provide support and encouragement, care continuity and to assess for needs, met patient [facility] where he had arrived for [facility] placement and PICC removal.  He requested care giver instruction for changing trach strap, I assured him that can be done when she joins him for an appt.  He confirmed his understanding of 12/21 1120 f/u appt with Dr. Isidore Moos, 11/23 0800 lab and 0845 chemo infusion. He did not express further needs or concerns at this time, I encouraged him to contact me if that changes, he verbalized understanding.  Gayleen Orem, RN, BSN, [place] at [place] [phone]                      Time Spent with Patient: 15 (11/30/15 1251)

## 2015-11-30 NOTE — H&P (Signed)
HPI: Patient with squamous cell carcinoma of supraglottis who has been seen by Dr. Alvy Bimler and scheduled today for image guided port a catheter placement  The patient has had a H&P performed within the last 30 days, all history, medications, and exam have been reviewed. The patient denies any interval changes since the H&P.  Medications: Prior to Admission medications   Medication Sig Start Date End Date Taking? Authorizing Provider  fluticasone (FLONASE) 50 MCG/ACT nasal spray Place 2 sprays into both nostrils daily. 11/20/15  Yes [name], MD  levothyroxine (LEVOTHROID) 25 MCG tablet Take 1 tablet (25 mcg total) by mouth daily before breakfast. 11/27/15  Yes [name], MD  metoCLOPramide (REGLAN) 5 MG/5ML solution Place 5 mLs (5 mg total) into feeding tube 4 (four) times daily -  before meals and at bedtime. 10/19/15  Yes [name], MD  omeprazole (PRILOSEC) 20 MG capsule Take 1 capsule by mouth daily. 10/19/15  Yes Historical Provider, MD  oxyCODONE (ROXICODONE) 5 MG/5ML solution Take 5 mLs (5 mg total) by mouth every 6 (six) hours as needed. 11/20/15  Yes [name], MD  oxymetazoline (AFRIN) 0.05 % nasal spray Place 1 spray into both nostrils as needed for congestion.   Yes Historical Provider, MD  polyethylene glycol (MIRALAX) packet Take 17 g by mouth daily. 10/19/15  Yes [name], MD  Sodium Chloride Flush (NORMAL SALINE FLUSH) 0.9 % SOLN Inject 10 mLs into the vein daily. 10/30/15  Yes [name], MD  emollient (BIAFINE) cream Apply 1 application topically 2 (two) times daily. 04/17/15   [name], MD  ibuprofen (ADVIL,MOTRIN) 200 MG tablet Take 200 mg by mouth every 6 (six) hours as needed for mild pain.    Historical Provider, MD  lidocaine-prilocaine (EMLA) cream Apply 1 application topically as needed. 10/26/15   [name], MD  LORazepam (ATIVAN) 0.5 MG tablet Take 1-2 tablets 30 min before radiotherapy for anxiety. 04/29/15   Arloa Koh, MD  nicotine (NICODERM CQ) 7 mg/24hr  patch apply 7 mg patch daily x 4 wks after quitting smoking 09/02/15   [name], MD  Nutritional Supplements (FEEDING SUPPLEMENT, OSMOLITE 1.2 CAL,) LIQD Give 2 cans osmolite 1.2 QID with 60 cc free water before and after bolus feeding.  Give an additional 240 cc free water via PEG daily. 04/27/15   [name], MD  sodium chloride irrigation 0.9 % irrigation Irrigate with 1,000 mLs as directed daily. Irrigate Feeding tube as directed daily 10/29/15   [name], MD     Vital Signs: BP 130/82 mmHg  Pulse 102  Temp(Src) 98.7 F (37.1 C) (Oral)  Resp 18  SpO2 100%  Physical Exam  Constitutional: He is oriented to person, place, and time. No distress.  HENT:  Tracheostomy intact  Cardiovascular: Regular rhythm.  Exam reveals no gallop and no friction rub.   No murmur heard. Tachycardic   Pulmonary/Chest: Effort normal and breath sounds normal. No respiratory distress. He has no wheezes. He has no rales.  Abdominal: Soft. Bowel sounds are normal.  G-tube intact  Neurological: He is alert and oriented to person, place, and time.  Skin: Skin is warm and dry. He is not diaphoretic.    Mallampati Score:  MD Evaluation Airway: WNL Heart: WNL Abdomen: WNL Chest/ Lungs: WNL ASA  Classification: 3 Mallampati/Airway Score: Two  Labs:  CBC:  Recent Labs  11/13/15 0854 11/20/15 0842 11/27/15 0753 11/30/15 1230  WBC 10.1 11.3* 3.3* 2.6*  HGB 12.9* 13.0 11.1* 11.0*  HCT  38.1* 38.0* 33.4* 32.0*  PLT 278 218 83* 54*    COAGS:  Recent Labs  03/15/15 1330 03/30/15 0345 10/28/15 0800 11/30/15 1230  INR 1.08 1.14 1.03 1.07  APTT 34  --  29  --     BMP:  Recent Labs  04/02/15 0628 04/03/15 0435 04/04/15 0408 10/28/15 0800 11/13/15 0854 11/20/15 0842 11/27/15 0753  NA 135 135 138 137 139 138 141  K 3.4* 3.3* 3.8 3.9 3.6 3.6 3.6  CL 93* 95* 97 97*  --   --   --   CO2 34* 35* 34* 32 28 25 29   GLUCOSE 117* 91 137* 96 163* 92 92  BUN <5* 7 6 14  13.7 15.2 10.1    CALCIUM 9.0 8.8 9.2 9.6 9.7 9.7 9.3  CREATININE 0.57 0.42* 0.46* 0.74 0.8 0.8 0.7  GFRNONAA >90 >90 >90 >60  --   --   --   GFRAA >90 >90 >90 >60  --   --   --     LIVER FUNCTION TESTS:  Recent Labs  10/28/15 0800 11/13/15 0854 11/20/15 0842 11/27/15 0753  BILITOT 0.1* <0.30 0.67 <0.30  AST 19 14 15 16   ALT 16* 11 12 16   ALKPHOS 65 65 60 63  PROT 8.2* 7.3 7.5 7.0  ALBUMIN 4.0 3.4* 3.6 3.4*    Assessment/Plan:  Squamous cell carcinoma of supraglottis  Seen by Dr. Alvy Bimler Scheduled today for image guided port a catheter placement with sedation The patient has been NPO, no blood thinners taken, labs and vitals have been reviewed. Risks and Benefits discussed with the patient including, but not limited to bleeding, infection, pneumothorax, or fibrin sheath development and need for additional procedures. All of the patient's questions were answered, patient is agreeable to proceed. Consent signed and in chart.   SignedHedy Jacob 11/30/2015, 2:23 PM

## 2015-11-30 NOTE — Discharge Instructions (Signed)
Implanted Port Insertion, Care After °Refer to this sheet in the next few weeks. These instructions provide you with information on caring for yourself after your procedure. Your health care provider may also give you more specific instructions. Your treatment has been planned according to current medical practices, but problems sometimes occur. Call your health care provider if you have any problems or questions after your procedure. °WHAT TO EXPECT AFTER THE PROCEDURE °After your procedure, it is typical to have the following:  °· Discomfort at the port insertion site. Ice packs to the area will help. °· Bruising on the skin over the port. This will subside in 3-4 days. °HOME CARE INSTRUCTIONS °· After your port is placed, you will get a manufacturer's information card. The card has information about your port. Keep this card with you at all times.   °· Know what kind of port you have. There are many types of ports available.   °· Wear a medical alert bracelet in case of an emergency. This can help alert health care workers that you have a port.   °· The port can stay in for as long as your health care provider believes it is necessary.   °· A home health care nurse may give medicines and take care of the port.   °· You or a family member can get special training and directions for giving medicine and taking care of the port at home.   °SEEK MEDICAL CARE IF:  °· Your port does not flush or you are unable to get a blood return.   °· You have a fever or chills. °SEEK IMMEDIATE MEDICAL CARE IF: °· You have new fluid or pus coming from your incision.   °· You notice a bad smell coming from your incision site.   °· You have swelling, pain, or more redness at the incision or port site.   °· You have chest pain or shortness of breath. °  °This information is not intended to replace advice given to you by your health care provider. Make sure you discuss any questions you have with your health care provider. °  °Document  Released: 09/18/2013 Document Revised: 12/03/2013 Document Reviewed: 09/18/2013 °Elsevier Interactive Patient Education ©2016 Else[employer] Inc[employer] °Implanted Port Home Guide °An implanted port is a type of central line that is placed under the skin. Central lines are used to provide IV access when treatment or nutrition needs to be given through a person's veins. Implanted ports are used for long-term IV access. An implanted port may be placed because:  °· You need IV medicine that would be irritating to the small veins in your hands or arms.   °· You need long-term IV medicines, such as antibiotics.   °· You need IV nutrition for a long period.   °· You need frequent blood draws for lab tests.   °· You need dialysis.   °Implanted ports are usually placed in the chest area, but they can also be placed in the upper arm, the abdomen, or the leg. An implanted port has two main parts:  °· Reservoir. The reservoir is round and will appear as a small, raised area under your skin. The reservoir is the part where a needle is inserted to give medicines or draw blood.   °· Catheter. The catheter is a thin, flexible tube that extends from the reservoir. The catheter is placed into a large vein. Medicine that is inserted into the reservoir goes into the catheter and then into the vein.   °HOW WILL I CARE FOR MY INCISION SITE? °Do not get the   incision site wet. Bathe or shower as directed by your health care provider.  °HOW IS MY PORT ACCESSED? °Special steps must be taken to access the port:  °· Before the port is accessed, a numbing cream can be placed on the skin. This helps numb the skin over the port site.   °· Your health care provider uses a sterile technique to access the port. °· Your health care provider must put on a mask and sterile gloves. °· The skin over your port is cleaned carefully with an antiseptic and allowed to dry. °· The port is gently pinched between sterile gloves, and a needle is inserted into the  port. °· Only "non-coring" port needles should be used to access the port. Once the port is accessed, a blood return should be checked. This helps ensure that the port is in the vein and is not clogged.   °· If your port needs to remain accessed for a constant infusion, a clear (transparent) bandage will be placed over the needle site. The bandage and needle will need to be changed every week, or as directed by your health care provider.   °· Keep the bandage covering the needle clean and dry. Do not get it wet. Follow your health care provider's instructions on how to take a shower or bath while the port is accessed.   °· If your port does not need to stay accessed, no bandage is needed over the port.   °WHAT IS FLUSHING? °Flushing helps keep the port from getting clogged. Follow your health care provider's instructions on how and when to flush the port. Ports are usually flushed with saline solution or a medicine called heparin. The need for flushing will depend on how the port is used.  °· If the port is used for intermittent medicines or blood draws, the port will need to be flushed:   °· After medicines have been given.   °· After blood has been drawn.   °· As part of routine maintenance.   °· If a constant infusion is running, the port may not need to be flushed.   °HOW LONG WILL MY PORT STAY IMPLANTED? °The port can stay in for as long as your health care provider thinks it is needed. When it is time for the port to come out, surgery will be done to remove it. The procedure is similar to the one performed when the port was put in.  °WHEN SHOULD I SEEK IMMEDIATE MEDICAL CARE? °When you have an implanted port, you should seek immediate medical care if:  °· You notice a bad smell coming from the incision site.   °· You have swelling, redness, or drainage at the incision site.   °· You have more swelling or pain at the port site or the surrounding area.   °· You have a fever that is not controlled with  medicine. °  °This information is not intended to replace advice given to you by your health care provider. Make sure you discuss any questions you have with your health care provider. °  °Document Released: 11/28/2005 Document Revised: 09/18/2013 Document Reviewed: 08/05/2013 °Elsevier Interactive Patient Education ©2016 Elsevier Inc. ° ° °Moderate Conscious Sedation, Adult, Care After °Refer to this sheet in the next few weeks. These instructions provide you with information on caring for yourself after your procedure. Your health care provider may also give you more specific instructions. Your treatment has been planned according to current medical practices, but problems sometimes occur. Call your health care provider if you have any problems or questions   after your procedure. °WHAT TO EXPECT AFTER THE PROCEDURE  °After your procedure: °· You may feel sleepy, clumsy, and have poor balance for several hours. °· Vomiting may occur if you eat too soon after the procedure. °HOME CARE INSTRUCTIONS °· Do not participate in any activities where you could become injured for at least 24 hours. Do not: °¨ Drive. °¨ Swim. °¨ Ride a bicycle. °¨ Operate heavy machinery. °¨ Cook. °¨ Use power tools. °¨ Climb ladders. °¨ Work from a high place. °· Do not make important decisions or sign legal documents until you are improved. °· If you vomit, drink water, juice, or soup when you can drink without vomiting. Make sure you have little or no nausea before eating solid foods. °· Only take over-the-counter or prescription medicines for pain, discomfort, or fever as directed by your health care provider. °· Make sure you and your family fully understand everything about the medicines given to you, including what side effects may occur. °· You should not drink alcohol, take sleeping pills, or take medicines that cause drowsiness for at least 24 hours. °· If you smoke, do not smoke without supervision. °· If you are feeling better, you  may resume normal activities 24 hours after you were sedated. °· Keep all appointments with your health care provider. °SEEK MEDICAL CARE IF: °· Your skin is pale or bluish in color. °· You continue to feel nauseous or vomit. °· Your pain is getting worse and is not helped by medicine. °· You have bleeding or swelling. °· You are still sleepy or feeling clumsy after 24 hours. °SEEK IMMEDIATE MEDICAL CARE IF: °· You develop a rash. °· You have difficulty breathing. °· You develop any type of allergic problem. °· You have a fever. °MAKE SURE YOU: °· Understand these instructions. °· Will watch your condition. °· Will get help right away if you are not doing well or get worse. °  °This information is not intended to replace advice given to you by your health care provider. Make sure you discuss any questions you have with your health care provider. °  °Document Released: 09/18/2013 Document Revised: 12/19/2014 Document Reviewed: 09/18/2013 °Elsevier Interactive Patient Education ©2016 Elsevier Inc. ° °

## 2015-12-01 ENCOUNTER — Other Ambulatory Visit: Payer: Self-pay | Admitting: *Deleted

## 2015-12-01 MED ORDER — LIDOCAINE-PRILOCAINE 2.5-2.5 % EX CREA: 1.0000 "application " | TOPICAL_CREAM | CUTANEOUS | Status: DC | PRN

## 2015-12-01 NOTE — Progress Notes (Signed)
Kyle Alexander presents for follow up of radiation completed 05/18/15 to his Supraglottis.   Pain issues, if any: No Using a feeding tube?: Yes. He instills 2 cans osmolite four times daily with 60 cc water flushes before and after. He also does an extra 240cc daily of free water. He takes his pills by mouth. He has been encouraged to try some foods by mouth, but so far has been nervous to do this.  Weight changes, if any:  Wt Readings from Last 3 Encounters:  12/02/15 152 lb 14.4 oz (69.355 kg)  11/20/15 151 lb 1.6 oz (68.539 kg)  10/30/15 153 lb 6.4 oz (69.582 kg)   Swallowing issues, if any: He denies any problem swallowing smaller pills, and states he is able to swallow water without difficulties.  Smoking or chewing tobacco? He is trying to quit. He admits to smoking 1 or 2 cigarettes daily.  Using fluoride trays daily? He uses mouth wash to his gums, he is having difficulty with his dentures because they make him gag.  Last ENT visit was on: He is trying to made appointment with Dr. [name] at this time, and reports it has been months since he saw him. Other notable issues, if any:    Per Dr. Calton Dach noted from 11/20/15: He tolerated cycle 1 of treatment without side effects apart from increased heartburn. I recommend we proceed with weekly Erbitux and cycle 2 of treatment on 12/04/2015. I plan for minimum 3 cycles of chemotherapy before repeat PET CT scan  BP 123/86 mmHg  Pulse 101  Temp(Src) 98.5 F (36.9 C)  Ht 5\' 10"  (1.778 m)  Wt 152 lb 14.4 oz (69.355 kg)  BMI 21.94 kg/m2

## 2015-12-02 ENCOUNTER — Ambulatory Visit: Admission: RE | Admit: 2015-12-02 | Payer: Medicaid Other | Source: Ambulatory Visit

## 2015-12-02 ENCOUNTER — Encounter: Payer: Self-pay | Admitting: Radiation Oncology

## 2015-12-02 ENCOUNTER — Telehealth: Payer: Self-pay | Admitting: *Deleted

## 2015-12-02 ENCOUNTER — Encounter: Payer: Self-pay | Admitting: *Deleted

## 2015-12-02 ENCOUNTER — Ambulatory Visit
Admission: RE | Admit: 2015-12-02 | Discharge: 2015-12-02 | Disposition: A | Discharge status: Home / Self Care | Payer: Medicaid Other | Source: Ambulatory Visit | Attending: Radiation Oncology | Admitting: Radiation Oncology

## 2015-12-02 ENCOUNTER — Ambulatory Visit: Payer: Medicaid Other

## 2015-12-02 VITALS — BP 123/86 | HR 101 | Temp 98.5°F | Ht 70.0 in | Wt 152.9 lb

## 2015-12-02 DIAGNOSIS — C321 Malignant neoplasm of supraglottis: Secondary | ICD-10-CM

## 2015-12-02 NOTE — Addendum Note (Signed)
Encounter addended by: Ernst Spell, RN on: 12/02/2015  3:32 PM<BR>     Documentation filed: Notes Section

## 2015-12-02 NOTE — Progress Notes (Signed)
I changed Mr. Epperson trach tie today. I demonstrated how to change it for his caregiver who also returned demonstration correctly. There were no difficulties, and they know to call if there are any questions or concerns.

## 2015-12-02 NOTE — Progress Notes (Signed)
  Oncology Nurse Navigator Documentation   Navigator Encounter Type: Clinic/MDC (12/02/15 1135) Patient Visit Type: Radonc;Follow-up (12/02/15 1135)   Barriers/Navigation Needs: Education (12/02/15 1135)     Met briefly with patient prior to his f/u appt with Dr. Isidore Moos. I informed Rx for Emla sent to [facility].  He showed me tube of Emla he had previously picked up. I reviewed the application procedure prior to infusion, he verbalized understanding.  Gayleen Orem, RN, BSN, [place] at [place] [phone]               Time Spent with Patient: 15 (12/02/15 1135)

## 2015-12-02 NOTE — Telephone Encounter (Signed)
  Oncology Nurse Navigator Documentation   Navigator Encounter Type: Telephone (12/02/15 1530)         Interventions: Coordination of Care (12/02/15 1530)     Per Dr. Pearlie Oyster guidance, called [facility] to arrange follow-up visit.  Spoke with Kalman Shan, requested that patient be contacted and appt arranged to see Dr. [name] in 1 month for routine follow-up.  She verbalized understanding.  Gayleen Orem, RN, BSN, [place] at [place] [phone]            Time Spent with Patient: 15 (12/02/15 1530)

## 2015-12-02 NOTE — Progress Notes (Signed)
Radiation Oncology         [phone] ________________________________  Name: Kyle Alexander MRN: [id number]  Date: 12/02/2015  DOB: Dec 14, 1960  Follow-Up Visit Note  CC: Pcp Not In System  Juluis Mire, MD  Diagnosis and Prior Radiotherapy:       ICD-9-CM ICD-10-CM   1. 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  Receiving chemotherapy for persistent disease with Dr Alvy Bimler   Pain issues, if any: No Using a feeding tube?: Yes. He instills 2 cans osmolite four times daily with 60 cc water flushes before and after. He also does an extra 240cc daily of free water. He takes his pills by mouth. He has been encouraged to try some foods by mouth, but so far has been nervous to do this.  Has been seeing Garald Balding [facility] Weight changes, if any:  Wt Readings from Last 3 Encounters:  12/02/15 152 lb 14.4 oz (69.355 kg)  11/20/15 151 lb 1.6 oz (68.539 kg)  10/30/15 153 lb 6.4 oz (69.582 kg)   Swallowing issues, if any: He denies any problem swallowing smaller pills, and states he is able to swallow water without difficulties.  Smoking or chewing tobacco? He is trying to quit. He admits to smoking 1 or 2 cigarettes daily.  Dental - He uses mouth wash to his gums, he is having difficulty with his dentures because they make him gag.  Last ENT visit was on: He is trying to made appointment with Dr. [name] at this time, and reports it has been months since he saw him.   Per Dr. Calton Dach noted from 11/20/15: He tolerated cycle 1 of treatment without side effects apart from increased heartburn. I recommend we proceed with weekly Erbitux and cycle 2 of treatment on  12/04/2015. I plan for minimum 3 cycles of chemotherapy before repeat PET CT scan       ALLERGIES:  is allergic to aspirin.  Meds: Current Outpatient Prescriptions  Medication Sig Dispense Refill  . fluticasone (FLONASE) 50 MCG/ACT nasal spray Place 2 sprays into both nostrils daily. 16 g 2  . ibuprofen (ADVIL,MOTRIN) 200 MG tablet Take 200 mg by mouth every 6 (six) hours as needed for mild pain.    Marland Kitchen levothyroxine (LEVOTHROID) 25 MCG tablet Take 1 tablet (25 mcg total) by mouth daily before breakfast. 30 tablet 3  . lidocaine-prilocaine (EMLA) cream Apply 1 application topically as needed. 30 g 0  . metoCLOPramide (REGLAN) 5 MG/5ML solution Place 5 mLs (5 mg total) into feeding tube 4 (four) times daily -  before meals and at bedtime. 120 mL 0  . Nutritional Supplements (FEEDING SUPPLEMENT, OSMOLITE 1.2 CAL,) LIQD Give 2 cans osmolite 1.2 QID with 60 cc free water before and after bolus feeding.  Give an additional 240 cc free water via PEG daily. 1896 mL 0  . omeprazole (PRILOSEC) 20 MG capsule Take 1 capsule by mouth daily.  6  . oxyCODONE (ROXICODONE) 5 MG/5ML solution Take 5 mLs (5 mg total) by mouth every 6 (six) hours as needed. 473 mL 0  . oxymetazoline (AFRIN) 0.05 % nasal spray Place 1 spray into both nostrils as  needed for congestion.    . Sodium Chloride Flush (NORMAL SALINE FLUSH) 0.9 % SOLN Inject 10 mLs into the vein daily. 30 Syringe 0  . sodium chloride irrigation 0.9 % irrigation Irrigate with 1,000 mLs as directed daily. Irrigate Feeding tube as directed daily 1000 mL 11  . emollient (BIAFINE) cream Apply 1 application topically 2 (two) times daily. Reported on 12/02/2015    . LORazepam (ATIVAN) 0.5 MG tablet Take 1-2 tablets 30 min before radiotherapy for anxiety. (Patient not taking: Reported on 12/02/2015) 25 tablet 0  . nicotine (NICODERM CQ) 7 mg/24hr patch apply 7 mg patch daily x 4 wks after quitting smoking (Patient not taking: Reported on 12/02/2015) 14 patch 1  .  polyethylene glycol (MIRALAX) packet Take 17 g by mouth daily. (Patient not taking: Reported on 12/02/2015) 14 each 0   No current facility-administered medications for this encounter.    Physical Findings: The patient is in no acute distress. Patient is alert and oriented. Wt Readings from Last 3 Encounters:  12/02/15 152 lb 14.4 oz (69.355 kg)  11/20/15 151 lb 1.6 oz (68.539 kg)  10/30/15 153 lb 6.4 oz (69.582 kg)    height is 5\' 10"  (1.778 m) and weight is 152 lb 14.4 oz (69.355 kg). His temperature is 98.5 F (36.9 C). His blood pressure is 123/86 and his pulse is 101. Marland Kitchen  General: Alert and oriented, in no acute distress HEENT: Mucous membranes moist.    No oropharengeal lesions Skin: Skin in treatment fields shows satisfactory healing.   Neck: Anterior firm lymphedema which extends through the anterior regions of 2 and 3 bilaterally.  difficult to tell if there are palpable cervical or supraclavicular masses due to fibrosis and edema, but upper neck is full bilaterally. +Trach. Extremities: No cyanosis or edema. Lymphatics: see Neck Exam Psychiatric: Judgment and insight are intact. Affect is appropriate. LUNGS: coarse sounds upon expiration in bases Heart: RRR  Lab Findings: Lab Results  Component Value Date   WBC 2.6* 11/30/2015   HGB 11.0* 11/30/2015   HCT 32.0* 11/30/2015   MCV 98.2 11/30/2015   PLT 54* 11/30/2015    Lab Results  Component Value Date   TSH 6.256* 11/27/2015    Radiographic Findings: Ir Fluoro Guide Cv Line Right  11/30/2015  CLINICAL DATA:  Squamous cell carcinoma of the supraglottis. Port-A-Cath needed for chemotherapy. EXAM: FLUOROSCOPIC AND ULTRASOUND GUIDED PLACEMENT OF A SUBCUTANEOUS PORT. Physician: Stephan Minister. Henn, MD FLUOROSCOPY TIME:  18 seconds, 5 mGy MEDICATIONS AND MEDICAL HISTORY: Ancef 2 g. As antibiotic prophylaxis, Ancef was ordered pre-procedure and administered intravenously within one hour of incision. 4 mg Versed, 50 mcg fentanyl. A  radiology nurse monitored the patient for moderate sedation. ANESTHESIA/SEDATION: Moderate sedation time: 26 minutes PROCEDURE: The risks of the procedure were explained to the patient. Informed consent was obtained. Patient was placed supine on the interventional table. Ultrasound confirmed a patent right internal jugular vein. The right chest and neck were cleaned with a skin antiseptic and a sterile drape was placed. Maximal barrier sterile technique was utilized including caps, mask, sterile gowns, sterile gloves, sterile drape, hand hygiene and skin antiseptic. The right neck was anesthetized with 1% lidocaine. Small incision was made in the right neck with a blade. Micropuncture set was placed in the right internal jugular vein with ultrasound guidance. The micropuncture wire was used for measurement purposes. The right chest was anesthetized with 1% lidocaine with epinephrine. #15 blade was used to make an incision and a subcutaneous  port pocket was formed. [place] was assembled. Subcutaneous tunnel was formed with a stiff tunneling device. The port catheter was brought through the subcutaneous tunnel. The port was placed in the subcutaneous pocket. The micropuncture set was exchanged for a peel-away sheath. The catheter was placed through the peel-away sheath and the tip was positioned at the cavoatrial junction. Catheter placement was confirmed with fluoroscopy. The port was accessed and flushed with heparinized saline. The port pocket was closed using two layers of absorbable sutures and Dermabond. The vein skin site was closed using a single layer of absorbable suture and Dermabond. Sterile dressings were applied. Patient tolerated the procedure well without an immediate complication. Ultrasound and fluoroscopic images were taken and saved for this procedure. Estimated blood loss: Minimal COMPLICATIONS: None IMPRESSION: Placement of a subcutaneous port device. Catheter tip at the superior  cavoatrial junction and ready to be used. Electronically Signed   By: Markus Daft M.D.   On: 11/30/2015 18:22   Ir Replc Gastro/colonic Tube Percut W/fluoro  11/19/2015  CLINICAL DATA:  Surgically placed gastrostomy catheter which has been exchanged percutaneously x2. Abdominal pain associated with gastrostomy catheter. EXAM: GASTROSTOMY CATHETER REPLACEMENT FLUOROSCOPY TIME:  0.3 minutes, 81  uGym2 DAP TECHNIQUE: The procedure, risks, benefits, and alternatives were explained to the patient. Questions regarding the procedure were encouraged and answered. The patient understands and consents to the procedure. No preprocedural antibiotics were required for this case. The preexisting catheter was injected with contrast material under fluoroscopy. It was then removed. A new 79 French balloon retention gastrostomy tube was advanced through the percutaneous tract. The retention balloon was inflated with 8 mL of saline. Final catheter position was confirmed with a fluoroscopic spot image obtained after injection of contrast. COMPLICATIONS: None. FINDINGS: Current gastrostomy tube is positioned in the distal body of the stomach. The retention balloon is opacified by diluted contrast material. Due to symptoms of discomfort, it was decided to exchange the catheter for a new catheter. The new catheter tip is positioned in the body of the stomach. IMPRESSION: Replacement of gastrostomy tube with a new 37 French balloon retention tube. The tip is located in the body of the stomach in the catheter may be used immediately. Electronically Signed   By: Lucrezia Europe M.D.   On: 11/19/2015 14:31   Ir US Guide Vasc Access Right  11/30/2015  CLINICAL DATA:  Squamous cell carcinoma of the supraglottis. Port-A-Cath needed for chemotherapy. EXAM: FLUOROSCOPIC AND ULTRASOUND GUIDED PLACEMENT OF A SUBCUTANEOUS PORT. Physician: Stephan Minister. Henn, MD FLUOROSCOPY TIME:  18 seconds, 5 mGy MEDICATIONS AND MEDICAL HISTORY: Ancef 2 g. As antibiotic  prophylaxis, Ancef was ordered pre-procedure and administered intravenously within one hour of incision. 4 mg Versed, 50 mcg fentanyl. A radiology nurse monitored the patient for moderate sedation. ANESTHESIA/SEDATION: Moderate sedation time: 26 minutes PROCEDURE: The risks of the procedure were explained to the patient. Informed consent was obtained. Patient was placed supine on the interventional table. Ultrasound confirmed a patent right internal jugular vein. The right chest and neck were cleaned with a skin antiseptic and a sterile drape was placed. Maximal barrier sterile technique was utilized including caps, mask, sterile gowns, sterile gloves, sterile drape, hand hygiene and skin antiseptic. The right neck was anesthetized with 1% lidocaine. Small incision was made in the right neck with a blade. Micropuncture set was placed in the right internal jugular vein with ultrasound guidance. The micropuncture wire was used for measurement purposes. The right chest was anesthetized  with 1% lidocaine with epinephrine. #15 blade was used to make an incision and a subcutaneous port pocket was formed. [place] was assembled. Subcutaneous tunnel was formed with a stiff tunneling device. The port catheter was brought through the subcutaneous tunnel. The port was placed in the subcutaneous pocket. The micropuncture set was exchanged for a peel-away sheath. The catheter was placed through the peel-away sheath and the tip was positioned at the cavoatrial junction. Catheter placement was confirmed with fluoroscopy. The port was accessed and flushed with heparinized saline. The port pocket was closed using two layers of absorbable sutures and Dermabond. The vein skin site was closed using a single layer of absorbable suture and Dermabond. Sterile dressings were applied. Patient tolerated the procedure well without an immediate complication. Ultrasound and fluoroscopic images were taken and saved for this procedure.  Estimated blood loss: Minimal COMPLICATIONS: None IMPRESSION: Placement of a subcutaneous port device. Catheter tip at the superior cavoatrial junction and ready to be used. Electronically Signed   By: Markus Daft M.D.   On: 11/30/2015 18:22    Impression/Plan:    1) Head and Neck Cancer Status: receiving Palliative Chemotherapy for persistent disease  2) Nutritional Status:  Weight stable  3) Risk Factors: The patient has been educated about risk factors including alcohol and tobacco abuse; they understand that avoidance of alcohol and tobacco is important to prevent recurrences as well as other cancers.   4) Swallowing:   Follow w/ [facility], soft food as tolerated, +PEG.  5) Dental: dentures as tolerated ses.  6) Thyroid function:   On supplement, Dr Alvy Bimler kindly offered to continue to follow and titrate meds as needed. Lab Results  Component Value Date   TSH 6.256* 11/27/2015     7) Other:     I encouraged him to continue with followup with medical oncology. I will see him back on an as-needed basis. I have encouraged him to call if she has any issues or concerns in the future. I wished him the very best.   Gayleen Orem, RN, our Head and Neck Oncology Navigator will arrange f/u in ENT.   _____________________________________   [name], MD

## 2015-12-04 ENCOUNTER — Ambulatory Visit: Payer: Medicaid Other

## 2015-12-04 ENCOUNTER — Ambulatory Visit: Payer: Self-pay

## 2015-12-04 ENCOUNTER — Other Ambulatory Visit (HOSPITAL_BASED_OUTPATIENT_CLINIC_OR_DEPARTMENT_OTHER): Payer: Medicaid Other

## 2015-12-04 ENCOUNTER — Other Ambulatory Visit (HOSPITAL_COMMUNITY)
Admission: RE | Admit: 2015-12-04 | Discharge: 2015-12-04 | Disposition: A | Discharge status: Home / Self Care | Payer: Medicaid Other | Source: Ambulatory Visit | Attending: Hematology and Oncology | Admitting: Hematology and Oncology

## 2015-12-04 ENCOUNTER — Ambulatory Visit (HOSPITAL_BASED_OUTPATIENT_CLINIC_OR_DEPARTMENT_OTHER): Payer: Medicaid Other

## 2015-12-04 VITALS — BP 114/74 | HR 89 | Temp 99.5°F | Resp 18

## 2015-12-04 DIAGNOSIS — C321 Malignant neoplasm of supraglottis: Secondary | ICD-10-CM

## 2015-12-04 DIAGNOSIS — Z95828 Presence of other vascular implants and grafts: Secondary | ICD-10-CM

## 2015-12-04 LAB — COMPREHENSIVE METABOLIC PANEL
ALT: 16 U/L — AB (ref 17–63)
ANION GAP: 12 (ref 5–15)
AST: 17 U/L (ref 15–41)
Albumin: 3.9 g/dL (ref 3.5–5.0)
Alkaline Phosphatase: 57 U/L (ref 38–126)
BUN: 14 mg/dL (ref 6–20)
CHLORIDE: 101 mmol/L (ref 101–111)
CO2: 27 mmol/L (ref 22–32)
CREATININE: 0.69 mg/dL (ref 0.61–1.24)
Calcium: 9.5 mg/dL (ref 8.9–10.3)
Glucose, Bld: 111 mg/dL — ABNORMAL HIGH (ref 65–99)
Potassium: 3.4 mmol/L — ABNORMAL LOW (ref 3.5–5.1)
SODIUM: 140 mmol/L (ref 135–145)
Total Bilirubin: 0.4 mg/dL (ref 0.3–1.2)
Total Protein: 7.3 g/dL (ref 6.5–8.1)

## 2015-12-04 LAB — CBC WITH DIFFERENTIAL/PLATELET
BASO%: 0.3 % (ref 0.0–2.0)
BASOS ABS: 0 10*3/uL (ref 0.0–0.1)
EOS ABS: 0 10*3/uL (ref 0.0–0.5)
EOS%: 1.4 % (ref 0.0–7.0)
HCT: 33.2 % — ABNORMAL LOW (ref 38.4–49.9)
HGB: 11.2 g/dL — ABNORMAL LOW (ref 13.0–17.1)
LYMPH%: 21.4 % (ref 14.0–49.0)
MCH: 33.4 pg (ref 27.2–33.4)
MCHC: 33.9 g/dL (ref 32.0–36.0)
MCV: 98.6 fL — AB (ref 79.3–98.0)
MONO#: 0.4 10*3/uL (ref 0.1–0.9)
MONO%: 13.8 % (ref 0.0–14.0)
NEUT#: 2 10*3/uL (ref 1.5–6.5)
NEUT%: 63.1 % (ref 39.0–75.0)
PLATELETS: 67 10*3/uL — AB (ref 140–400)
RBC: 3.37 10*6/uL — AB (ref 4.20–5.82)
RDW: 13.1 % (ref 11.0–14.6)
WBC: 3.1 10*3/uL — AB (ref 4.0–10.3)
lymph#: 0.7 10*3/uL — ABNORMAL LOW (ref 0.9–3.3)

## 2015-12-04 LAB — MAGNESIUM: MAGNESIUM: 1.8 mg/dL (ref 1.7–2.4)

## 2015-12-04 MED ORDER — SODIUM CHLORIDE 0.9 % IJ SOLN
10.0000 mL | INTRAMUSCULAR | Status: DC | PRN
  Administered 2015-12-04: 10 mL via INTRAVENOUS
  Filled 2015-12-04: qty 10

## 2015-12-04 MED ORDER — HEPARIN SOD (PORK) LOCK FLUSH 100 UNIT/ML IV SOLN
500.0000 [IU] | Freq: Once | INTRAVENOUS | Status: AC
  Administered 2015-12-04: 500 [IU] via INTRAVENOUS
  Filled 2015-12-04: qty 5

## 2015-12-04 NOTE — Progress Notes (Signed)
Per [place]ie  RN (Dr. Hazeline Junker nurse) treatment will be held today. Pt aware and educated to monitored for any s/s of bleeding and to continue to monitor tempeture and to call with any concerns or change. Pt aware and verbalizes understanding. Pt and VS stable at time of discharge.

## 2015-12-04 NOTE — Patient Instructions (Signed)

## 2015-12-11 ENCOUNTER — Other Ambulatory Visit: Payer: Self-pay | Admitting: *Deleted

## 2015-12-11 ENCOUNTER — Encounter: Payer: Self-pay | Admitting: Hematology and Oncology

## 2015-12-11 ENCOUNTER — Ambulatory Visit: Payer: Medicaid Other

## 2015-12-11 ENCOUNTER — Ambulatory Visit (HOSPITAL_BASED_OUTPATIENT_CLINIC_OR_DEPARTMENT_OTHER): Payer: Medicaid Other | Admitting: Hematology and Oncology

## 2015-12-11 ENCOUNTER — Encounter: Payer: Self-pay | Admitting: *Deleted

## 2015-12-11 ENCOUNTER — Telehealth: Payer: Self-pay | Admitting: Hematology and Oncology

## 2015-12-11 ENCOUNTER — Ambulatory Visit (HOSPITAL_BASED_OUTPATIENT_CLINIC_OR_DEPARTMENT_OTHER): Payer: Medicaid Other

## 2015-12-11 ENCOUNTER — Other Ambulatory Visit (HOSPITAL_BASED_OUTPATIENT_CLINIC_OR_DEPARTMENT_OTHER): Payer: Medicaid Other

## 2015-12-11 VITALS — BP 120/70 | HR 95 | Temp 98.7°F | Resp 18

## 2015-12-11 VITALS — Ht 70.0 in | Wt 156.8 lb

## 2015-12-11 DIAGNOSIS — D6481 Anemia due to antineoplastic chemotherapy: Secondary | ICD-10-CM | POA: Diagnosis not present

## 2015-12-11 DIAGNOSIS — C321 Malignant neoplasm of supraglottis: Secondary | ICD-10-CM | POA: Diagnosis present

## 2015-12-11 DIAGNOSIS — L708 Other acne: Secondary | ICD-10-CM | POA: Insufficient documentation

## 2015-12-11 DIAGNOSIS — Z5111 Encounter for antineoplastic chemotherapy: Secondary | ICD-10-CM

## 2015-12-11 DIAGNOSIS — Z720 Tobacco use: Secondary | ICD-10-CM | POA: Diagnosis not present

## 2015-12-11 DIAGNOSIS — L309 Dermatitis, unspecified: Secondary | ICD-10-CM | POA: Diagnosis not present

## 2015-12-11 DIAGNOSIS — Z5112 Encounter for antineoplastic immunotherapy: Secondary | ICD-10-CM

## 2015-12-11 DIAGNOSIS — T451X5A Adverse effect of antineoplastic and immunosuppressive drugs, initial encounter: Secondary | ICD-10-CM

## 2015-12-11 LAB — COMPREHENSIVE METABOLIC PANEL
ALT: 12 U/L (ref 0–55)
AST: 15 U/L (ref 5–34)
Albumin: 3.7 g/dL (ref 3.5–5.0)
Alkaline Phosphatase: 60 U/L (ref 40–150)
Anion Gap: 9 mEq/L (ref 3–11)
BUN: 12.3 mg/dL (ref 7.0–26.0)
CHLORIDE: 104 meq/L (ref 98–109)
CO2: 28 meq/L (ref 22–29)
Calcium: 9.2 mg/dL (ref 8.4–10.4)
Creatinine: 0.8 mg/dL (ref 0.7–1.3)
GLUCOSE: 99 mg/dL (ref 70–140)
POTASSIUM: 3.4 meq/L — AB (ref 3.5–5.1)
SODIUM: 141 meq/L (ref 136–145)
TOTAL PROTEIN: 7.3 g/dL (ref 6.4–8.3)
Total Bilirubin: <0.3 mg/dL (ref 0.20–1.20)

## 2015-12-11 LAB — MAGNESIUM: MAGNESIUM: 2 mg/dL (ref 1.5–2.5)

## 2015-12-11 LAB — CBC WITH DIFFERENTIAL/PLATELET
BASO%: 0.1 % (ref 0.0–2.0)
BASOS ABS: 0 10*3/uL (ref 0.0–0.1)
EOS%: 0.6 % (ref 0.0–7.0)
Eosinophils Absolute: 0 10*3/uL (ref 0.0–0.5)
HCT: 31.5 % — ABNORMAL LOW (ref 38.4–49.9)
HEMOGLOBIN: 10.7 g/dL — AB (ref 13.0–17.1)
LYMPH%: 8.5 % — ABNORMAL LOW (ref 14.0–49.0)
MCH: 34.1 pg — AB (ref 27.2–33.4)
MCHC: 33.9 g/dL (ref 32.0–36.0)
MCV: 100.8 fL — ABNORMAL HIGH (ref 79.3–98.0)
MONO#: 0.5 10*3/uL (ref 0.1–0.9)
MONO%: 9.2 % (ref 0.0–14.0)
NEUT#: 4.2 10*3/uL (ref 1.5–6.5)
NEUT%: 81.6 % — ABNORMAL HIGH (ref 39.0–75.0)
Platelets: 205 10*3/uL (ref 140–400)
RBC: 3.13 10*6/uL — AB (ref 4.20–5.82)
RDW: 13.4 % (ref 11.0–14.6)
WBC: 5.1 10*3/uL (ref 4.0–10.3)
lymph#: 0.4 10*3/uL — ABNORMAL LOW (ref 0.9–3.3)

## 2015-12-11 MED ORDER — SODIUM CHLORIDE 0.9 % IJ SOLN
10.0000 mL | Freq: Once | INTRAMUSCULAR | Status: AC
  Administered 2015-12-11: 10 mL
  Filled 2015-12-11: qty 10

## 2015-12-11 MED ORDER — HEPARIN SOD (PORK) LOCK FLUSH 100 UNIT/ML IV SOLN
500.0000 [IU] | Freq: Once | INTRAVENOUS | Status: DC | PRN
  Filled 2015-12-11: qty 5

## 2015-12-11 MED ORDER — DIPHENHYDRAMINE HCL 50 MG/ML IJ SOLN
50.0000 mg | Freq: Once | INTRAMUSCULAR | Status: AC
  Administered 2015-12-11: 50 mg via INTRAVENOUS

## 2015-12-11 MED ORDER — DIPHENHYDRAMINE HCL 50 MG/ML IJ SOLN
INTRAMUSCULAR | Status: AC
  Filled 2015-12-11: qty 1

## 2015-12-11 MED ORDER — SODIUM CHLORIDE 0.9 % IV SOLN
653.5000 mg | Freq: Once | INTRAVENOUS | Status: AC
  Administered 2015-12-11: 650 mg via INTRAVENOUS
  Filled 2015-12-11: qty 65

## 2015-12-11 MED ORDER — CETUXIMAB CHEMO IV INJECTION 200 MG/100ML
250.0000 mg/m2 | Freq: Once | INTRAVENOUS | Status: AC
Start: 2015-12-11 — End: 2015-12-11
  Administered 2015-12-11: 500 mg via INTRAVENOUS
  Filled 2015-12-11: qty 250

## 2015-12-11 MED ORDER — SODIUM CHLORIDE 0.9 % IV SOLN
Freq: Once | INTRAVENOUS | Status: AC
  Administered 2015-12-11: 13:00:00 via INTRAVENOUS
  Filled 2015-12-11: qty 8

## 2015-12-11 MED ORDER — SODIUM CHLORIDE 0.9 % IV SOLN
1015.0000 mg/m2/d | INTRAVENOUS | Status: DC
  Administered 2015-12-11: 7500 mg via INTRAVENOUS
  Filled 2015-12-11: qty 150

## 2015-12-11 MED ORDER — SODIUM CHLORIDE 0.9 % IV SOLN
Freq: Once | INTRAVENOUS | Status: AC
  Administered 2015-12-11: 11:00:00 via INTRAVENOUS

## 2015-12-11 MED ORDER — SODIUM CHLORIDE 0.9 % IJ SOLN
10.0000 mL | INTRAMUSCULAR | Status: DC | PRN
  Filled 2015-12-11: qty 10

## 2015-12-11 MED ORDER — NICOTINE 7 MG/24HR TD PT24: MEDICATED_PATCH | TRANSDERMAL | Status: DC

## 2015-12-11 NOTE — Assessment & Plan Note (Signed)
His currently using a nicotine patch. I continue to encourage him to quit smoking. I refilled his prescription for nicotine patch

## 2015-12-11 NOTE — Progress Notes (Signed)
  Oncology Nurse Navigator Documentation   Navigator Encounter Type: Treatment (12/11/15 1433) Patient Visit Type: Medonc (12/11/15 1433)     To provide support and encouragement, care continuity and to assess for needs, met with Kyle Alexander in Infusion where he was beginning 2nd cycle of carboplatin 5FU.  He reported he is doing well, gained 4 lbs since last week.  He was unable to attribute gain to anything specific but was pleased. He did not express any needs or concerns at this time, I encouraged him to contact me if that changes, he verbalized understanding.   Gayleen Orem, RN, BSN, [place] [facility] [phone]                    Time Spent with Patient: 15 (12/11/15 1433)

## 2015-12-11 NOTE — Assessment & Plan Note (Signed)
He tolerated cycle 1 of treatment without side effects apart from increased heartburn. I recommend we proceed with weekly Erbitux and cycle 2 of treatment on today I plan to repeat PET CT scan before he returns next visit

## 2015-12-11 NOTE — Assessment & Plan Note (Signed)
This is likely due to recent treatment. The patient denies recent history of bleeding such as epistaxis, hematuria or hematochezia. He is asymptomatic from the anemia. I will observe for now.  He does not require transfusion now. I will continue the chemotherapy at current dose without dosage adjustment.  If the anemia gets progressive worse in the future, I might have to delay his treatment or adjust the chemotherapy dose.  

## 2015-12-11 NOTE — Progress Notes (Signed)
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[place])   03/15/2015 - 03/17/2015 Hospital Admission He was admitted to the hospital for evaluation of dysphagia, SOB, hemoptosis, hoarseness, 30-40 pound weight loss and worsening bilateral neck masses for 5 months   03/15/2015 Imaging Ct showed extensive circumferential malignancy in the hypopharyngeal/supraglottic region with regional LN metastases   03/16/2015 Procedure He underwent ULTRASOUND-GUIDED BIOPSY OF LEFT CERVICAL LYMPH NODES   03/16/2015 Pathology Results Accession: [id number] LN biopsy showed invasive squamous cell cancer   03/16/2015 Pathology Results Accession: [id number] showed atypical squamous cells   03/25/2015 - 04/07/2015 Hospital Admission He was admitted to the hospital and underwent tracheostomy placement, feeeding tube placement but subsequently left [facility]   03/26/2015 Surgery He had multiple extraction of tooth numbers 1, 2, 5, 6, 7, 8, 9, 10, 11, 12, 13, 18, 19, 21, 22, 23, 24, 25, 26, 27, 28, and 29. and 4 Quadrants of alveoloplasty   03/26/2015 Surgery He underwent tracheostomy   03/31/2015 Surgery He had open gastrostomy tube placement by Dr. Donne Hazel   04/16/2015 - 05/18/2015 Radiation Therapy Laryngopharynx and bilateral neck / 50 Gy in 20 fractions to gross disease, 45 Gy in 20 fractions to high risk nodal echelons  Beams/energy: Helical IMRT / 6 MV photons   04/16/2015 Procedure Fluoroscopic reposition of the 18 French gastrostomy confirmed back in the stomach,   07/03/2015 Procedure IR performed replacement of gastrostomy tube with a new 54 French balloon retention tube   10/01/2015 Imaging PEt scan showed persistent hypermetabolism within the primary  supraglottic laryngeal tumor and within right retropharyngeal, bilateral level II and left level IV cervical nodal metastases   10/28/2015 Procedure He had placement of PICC line. The IR was not able to place PORT due to suspected upper respiratory infection   11/13/2015 -  Chemotherapy He received 5FU, carboplatin chemo with weekly Erbitux   11/19/2015 Surgery Gastrostomy tube replaced.   11/30/2015 Procedure Placement of right jugular port-a-cath.   11/30/2015 Procedure PICC removed.    INTERVAL HISTORY: Please see below for problem oriented charting. He returns for cycle 2 of chemotherapy. He tolerated his recent treatment well up from some reflux and skin rash from Erbitux. He denies recent diarrhea. He is gaining weight. He is taking all his thyroid replacement therapy. He continues to smoke but is attempting to quit. His pain appears to be under control. REVIEW OF SYSTEMS:   Constitutional: Denies fevers, chills or abnormal weight loss Eyes: Denies blurriness of vision Ears, nose, mouth, throat, and face: Denies mucositis or sore throat Respiratory: Denies cough, dyspnea or wheezes Cardiovascular: Denies palpitation, chest discomfort or lower extremity swelling Lymphatics: Denies new lymphadenopathy or easy bruising Neurological:Denies numbness, tingling or new weaknesses Behavioral/Psych: Mood is stable, no new changes  All other systems were reviewed with the patient and are negative.  I have reviewed the past medical history, past surgical history, social history and family history with the patient and they are unchanged from previous note.  ALLERGIES:  is allergic to aspirin.  MEDICATIONS:  Current Outpatient Prescriptions  Medication Sig Dispense Refill  . emollient (BIAFINE) cream Apply 1 application topically 2 (two) times daily. Reported on 12/02/2015    . fluticasone (FLONASE) 50 MCG/ACT nasal spray Place  2 sprays into both nostrils daily. 16 g 2  . levothyroxine  (LEVOTHROID) 25 MCG tablet Take 1 tablet (25 mcg total) by mouth daily before breakfast. 30 tablet 3  . lidocaine-prilocaine (EMLA) cream Apply 1 application topically as needed. 30 g 0  . metoCLOPramide (REGLAN) 5 MG/5ML solution Place 5 mLs (5 mg total) into feeding tube 4 (four) times daily -  before meals and at bedtime. 120 mL 0  . Nutritional Supplements (FEEDING SUPPLEMENT, OSMOLITE 1.2 CAL,) LIQD Give 2 cans osmolite 1.2 QID with 60 cc free water before and after bolus feeding.  Give an additional 240 cc free water via PEG daily. 1896 mL 0  . omeprazole (PRILOSEC) 20 MG capsule Take 1 capsule by mouth daily.  6  . oxyCODONE (ROXICODONE) 5 MG/5ML solution Take 5 mLs (5 mg total) by mouth every 6 (six) hours as needed. 473 mL 0  . oxymetazoline (AFRIN) 0.05 % nasal spray Place 1 spray into both nostrils as needed for congestion.    . Sodium Chloride Flush (NORMAL SALINE FLUSH) 0.9 % SOLN Inject 10 mLs into the vein daily. 30 Syringe 0  . sodium chloride irrigation 0.9 % irrigation Irrigate with 1,000 mLs as directed daily. Irrigate Feeding tube as directed daily 1000 mL 11  . ibuprofen (ADVIL,MOTRIN) 200 MG tablet Take 200 mg by mouth every 6 (six) hours as needed for mild pain. Reported on 12/11/2015    . LORazepam (ATIVAN) 0.5 MG tablet Take 1-2 tablets 30 min before radiotherapy for anxiety. (Patient not taking: Reported on 12/02/2015) 25 tablet 0  . nicotine (NICODERM CQ) 7 mg/24hr patch apply 7 mg patch daily x 4 wks after quitting smoking 14 patch 6  . polyethylene glycol (MIRALAX) packet Take 17 g by mouth daily. (Patient not taking: Reported on 12/02/2015) 14 each 0   No current facility-administered medications for this visit.    PHYSICAL EXAMINATION: ECOG PERFORMANCE STATUS: 1 - Symptomatic but completely ambulatory  There were no vitals filed for this visit. Filed Weights   12/11/15 1033  Weight: 156 lb 12.8 oz (71.124 kg)    GENERAL:alert, no distress and  comfortable SKIN: note a mild acneform rash. EYES: normal, Conjunctiva are pink and non-injected, sclera clear OROPHARYNX:no exudate, no erythema and lips, buccal mucosa, and tongue normal  NECK:  Tracheostomy in situ. LYMPH:  no palpable lymphadenopathy in the cervical, axillary or inguinal LUNGS: clear to auscultation and percussion with normal breathing effort HEART: regular rate & rhythm and no murmurs and no lower extremity edema ABDOMEN:abdomen soft, non-tender and normal bowel sounds. Feeding tube in situ Musculoskeletal:no cyanosis of digits and no clubbing  NEURO: alert & oriented x 3 with fluent speech, no focal motor/sensory deficits  LABORATORY DATA:  I have reviewed the data as listed    Component Value Date/Time   NA 140 12/04/2015 0912   NA 141 11/27/2015 0753   K 3.4* 12/04/2015 0912   K 3.6 11/27/2015 0753   CL 101 12/04/2015 0912   CO2 27 12/04/2015 0912   CO2 29 11/27/2015 0753   GLUCOSE 111* 12/04/2015 0912   GLUCOSE 92 11/27/2015 0753   BUN 14 12/04/2015 0912   BUN 10.1 11/27/2015 0753   CREATININE 0.69 12/04/2015 0912   CREATININE 0.7 11/27/2015 0753   CALCIUM 9.5 12/04/2015 0912   CALCIUM 9.3 11/27/2015 0753   PROT 7.3 12/04/2015 0912   PROT 7.0 11/27/2015 0753   ALBUMIN 3.9 12/04/2015 0912   ALBUMIN 3.4* 11/27/2015 0753   AST  17 12/04/2015 0912   AST 16 11/27/2015 0753   ALT 16* 12/04/2015 0912   ALT 16 11/27/2015 0753   ALKPHOS 57 12/04/2015 0912   ALKPHOS 63 11/27/2015 0753   BILITOT 0.4 12/04/2015 0912   BILITOT <0.30 11/27/2015 0753   GFRNONAA >60 12/04/2015 0912   GFRAA >60 12/04/2015 0912    No results found for: SPEP, UPEP  Lab Results  Component Value Date   WBC 5.1 12/11/2015   NEUTROABS 4.2 12/11/2015   HGB 10.7* 12/11/2015   HCT 31.5* 12/11/2015   MCV 100.8* 12/11/2015   PLT 205 12/11/2015      Chemistry      Component Value Date/Time   NA 140 12/04/2015 0912   NA 141 11/27/2015 0753   K 3.4* 12/04/2015 0912   K 3.6  11/27/2015 0753   CL 101 12/04/2015 0912   CO2 27 12/04/2015 0912   CO2 29 11/27/2015 0753   BUN 14 12/04/2015 0912   BUN 10.1 11/27/2015 0753   CREATININE 0.69 12/04/2015 0912   CREATININE 0.7 11/27/2015 0753      Component Value Date/Time   CALCIUM 9.5 12/04/2015 0912   CALCIUM 9.3 11/27/2015 0753   ALKPHOS 57 12/04/2015 0912   ALKPHOS 63 11/27/2015 0753   AST 17 12/04/2015 0912   AST 16 11/27/2015 0753   ALT 16* 12/04/2015 0912   ALT 16 11/27/2015 0753   BILITOT 0.4 12/04/2015 0912   BILITOT <0.30 11/27/2015 0753       ASSESSMENT & PLAN:  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 I recommend we proceed with weekly Erbitux and cycle 2 of treatment on today I plan to repeat PET CT scan before he returns next visit    Anemia due to antineoplastic chemotherapy This is likely due to recent treatment. The patient denies recent history of bleeding such as epistaxis, hematuria or hematochezia. He is asymptomatic from the anemia. I will observe for now.  He does not require transfusion now. I will continue the chemotherapy at current dose without dosage adjustment.  If the anemia gets progressive worse in the future, I might have to delay his treatment or adjust the chemotherapy dose.   Acneiform rash  This is mild grade 1 only, related to side effects of Erbitux. We will continue the same treatment without adjustment  Tobacco abuse His currently using a nicotine patch. I continue to encourage him to quit smoking. I refilled his prescription for nicotine patch     Orders Placed This Encounter  Procedures  . NM PET Image Restag (PS) Skull Base To Thigh    Standing Status: Future     Number of Occurrences:      Standing Expiration Date: 02/09/2017    Order Specific Question:  Reason for Exam (SYMPTOM  OR DIAGNOSIS REQUIRED)    Answer:  staging tonsil ca, assess response to Rx     Order Specific Question:  Preferred imaging location?    Answer:  [facility]   All questions were answered. The patient knows to call the clinic with any problems, questions or concerns. No barriers to learning was detected. I spent 25 minutes counseling the patient face to face. The total time spent in the appointment was 30 minutes and more than 50% was on counseling and review of test results     [facility], [place], MD 12/11/2015 10:57 AM

## 2015-12-11 NOTE — Telephone Encounter (Signed)
Schedule patient appt per pof, avs report printed.  °

## 2015-12-11 NOTE — Assessment & Plan Note (Signed)
This is mild grade 1 only, related to side effects of Erbitux. We will continue the same treatment without adjustment

## 2015-12-11 NOTE — Patient Instructions (Signed)
[place] Discharge Instructions for Patients Receiving Chemotherapy  Today you received the following chemotherapy agents Erbitux, Carboplatin and Fluorouracil.  To help prevent nausea and vomiting after your treatment, we encourage you to take your nausea medication as directed.  If you develop nausea and vomiting that is not controlled by your nausea medication, call the clinic.   BELOW ARE SYMPTOMS THAT SHOULD BE REPORTED IMMEDIATELY:  *FEVER GREATER THAN 100.5 F  *CHILLS WITH OR WITHOUT FEVER  NAUSEA AND VOMITING THAT IS NOT CONTROLLED WITH YOUR NAUSEA MEDICATION  *UNUSUAL SHORTNESS OF BREATH  *UNUSUAL BRUISING OR BLEEDING  TENDERNESS IN MOUTH AND THROAT WITH OR WITHOUT PRESENCE OF ULCERS  *URINARY PROBLEMS  *BOWEL PROBLEMS  UNUSUAL RASH Items with * indicate a potential emergency and should be followed up as soon as possible.  Feel free to call the clinic you have any questions or concerns. The clinic phone number is [phone].  Please show the [place] at check-in to the Emergency Department and triage nurse.

## 2015-12-15 ENCOUNTER — Ambulatory Visit (HOSPITAL_BASED_OUTPATIENT_CLINIC_OR_DEPARTMENT_OTHER): Payer: Medicaid Other

## 2015-12-15 ENCOUNTER — Encounter: Payer: Self-pay | Admitting: Hematology and Oncology

## 2015-12-15 ENCOUNTER — Other Ambulatory Visit: Payer: Self-pay | Admitting: Hematology and Oncology

## 2015-12-15 ENCOUNTER — Ambulatory Visit: Payer: Medicaid Other

## 2015-12-15 VITALS — BP 147/90 | HR 110 | Temp 98.7°F | Resp 18 | Wt 156.6 lb

## 2015-12-15 VITALS — BP 131/71 | HR 91 | Temp 98.5°F | Resp 18

## 2015-12-15 DIAGNOSIS — R112 Nausea with vomiting, unspecified: Secondary | ICD-10-CM

## 2015-12-15 DIAGNOSIS — C321 Malignant neoplasm of supraglottis: Secondary | ICD-10-CM

## 2015-12-15 DIAGNOSIS — R070 Pain in throat: Secondary | ICD-10-CM | POA: Diagnosis not present

## 2015-12-15 DIAGNOSIS — R12 Heartburn: Secondary | ICD-10-CM

## 2015-12-15 HISTORY — DX: Nausea with vomiting, unspecified: R11.2

## 2015-12-15 MED ORDER — OXYCODONE HCL 5 MG/5ML PO SOLN
5.0000 mg | Freq: Four times a day (QID) | ORAL | Status: DC | PRN
Start: 2015-12-15 — End: 2016-01-01

## 2015-12-15 MED ORDER — SODIUM CHLORIDE 0.9 % IV SOLN
1000.0000 mL | Freq: Once | INTRAVENOUS | Status: AC
  Administered 2015-12-15: 1000 mL via INTRAVENOUS

## 2015-12-15 MED ORDER — SODIUM CHLORIDE 0.9 % IR SOLN: 1000.0000 mL | Freq: Every day | Status: DC

## 2015-12-15 MED ORDER — HYDROMORPHONE HCL 4 MG/ML IJ SOLN
INTRAMUSCULAR | Status: AC
  Filled 2015-12-15: qty 1

## 2015-12-15 MED ORDER — SODIUM CHLORIDE 0.9 % IJ SOLN
10.0000 mL | INTRAMUSCULAR | Status: DC | PRN
  Administered 2015-12-15: 10 mL
  Filled 2015-12-15: qty 10

## 2015-12-15 MED ORDER — HEPARIN SOD (PORK) LOCK FLUSH 100 UNIT/ML IV SOLN
500.0000 [IU] | Freq: Once | INTRAVENOUS | Status: AC | PRN
  Administered 2015-12-15: 500 [IU]
  Filled 2015-12-15: qty 5

## 2015-12-15 MED ORDER — METOCLOPRAMIDE HCL 5 MG/5ML PO SOLN: 5.0000 mg | Freq: Three times a day (TID) | ORAL | Status: DC

## 2015-12-15 MED ORDER — ONDANSETRON HCL 8 MG PO TABS: 8.0000 mg | ORAL_TABLET | Freq: Three times a day (TID) | ORAL | Status: DC | PRN

## 2015-12-15 MED ORDER — HYDROMORPHONE HCL 4 MG/ML IJ SOLN
2.0000 mg | Freq: Once | INTRAMUSCULAR | Status: AC | PRN
  Administered 2015-12-15: 2 mg via INTRAVENOUS

## 2015-12-15 MED ORDER — SODIUM CHLORIDE 0.9 % IV SOLN
Freq: Once | INTRAVENOUS | Status: AC
  Administered 2015-12-15: 14:00:00 via INTRAVENOUS
  Filled 2015-12-15: qty 4

## 2015-12-15 MED FILL — SODIUM CHLORIDE 0.9% IRRIG.: 0.9 | 1 days supply | Qty: 1000 | Fill #0

## 2015-12-15 MED FILL — ONDANSETRON HCL 8 MG TABLET: 8 | 20 days supply | Qty: 60 | Fill #0

## 2015-12-15 MED FILL — oxyCODONE HCL 5 MG/5ML SOLN: 5 | 23 days supply | Qty: 473 | Fill #0

## 2015-12-15 MED FILL — METOCLOPRAMIDE 5 MG/5 ML SO: 5 | 12 days supply | Qty: 240 | Fill #0

## 2015-12-15 NOTE — Progress Notes (Signed)
Patient states, "I'm not feeling good." Patient reports feeling nauseous and states that he has vomited today. [place], RN Desk Nurse for Dr. Alvy Bimler made aware and in to assess Patient.

## 2015-12-15 NOTE — Progress Notes (Signed)
Pt here for Pump D/C.  Reported feeling nauseated x 2 days.  Unable to keep liquids down.  States does not have any nausea meds at home.  This is the first time he has felt nauseated after chemotherapy.  Pt seen in Flush room and asks nurse to give him a shot for nausea.  He also has increased pain in throat he states exacerbated by vomiting.  Pt feels like he has only been able to get less than one cup of water in the past 24 hrs.   Notified Dr Alvy Bimler of above and IVFs, Zofran and Dilaudid ordered.  Pt taken care of in Exam Room as there is no room in Infusion at this time.  He has a ride home.   Rxs for Zofran and refill on oxycodone given to pt.. Pt also requested refill on Saline to use for his [facility]. Refill sent.  We reviewed med list, printed for pt and instructed on using Zofran for nausea and continue Reglan as ordered.  He states out of Reglan, so sent refill for that as well.   Instructed pt to call if Nausea not better by tomorrow and any other new problems or concerns before his next appt this Friday.  He verbalized understanding.

## 2015-12-17 ENCOUNTER — Encounter: Payer: Self-pay | Admitting: *Deleted

## 2015-12-18 ENCOUNTER — Ambulatory Visit (HOSPITAL_BASED_OUTPATIENT_CLINIC_OR_DEPARTMENT_OTHER): Payer: Medicaid Other

## 2015-12-18 ENCOUNTER — Ambulatory Visit: Payer: Medicaid Other

## 2015-12-18 ENCOUNTER — Other Ambulatory Visit (HOSPITAL_BASED_OUTPATIENT_CLINIC_OR_DEPARTMENT_OTHER): Payer: Medicaid Other

## 2015-12-18 VITALS — BP 130/82 | HR 90 | Temp 98.8°F | Resp 18

## 2015-12-18 DIAGNOSIS — Z5112 Encounter for antineoplastic immunotherapy: Secondary | ICD-10-CM

## 2015-12-18 DIAGNOSIS — C321 Malignant neoplasm of supraglottis: Secondary | ICD-10-CM

## 2015-12-18 DIAGNOSIS — Z95828 Presence of other vascular implants and grafts: Secondary | ICD-10-CM

## 2015-12-18 LAB — COMPREHENSIVE METABOLIC PANEL
ALT: 15 U/L (ref 0–55)
AST: 17 U/L (ref 5–34)
Albumin: 3.3 g/dL — ABNORMAL LOW (ref 3.5–5.0)
Alkaline Phosphatase: 51 U/L (ref 40–150)
Anion Gap: 9 mEq/L (ref 3–11)
BILIRUBIN TOTAL: 0.46 mg/dL (ref 0.20–1.20)
BUN: 13.5 mg/dL (ref 7.0–26.0)
CHLORIDE: 98 meq/L (ref 98–109)
CO2: 28 meq/L (ref 22–29)
CREATININE: 0.7 mg/dL (ref 0.7–1.3)
Calcium: 8.9 mg/dL (ref 8.4–10.4)
EGFR: >90 mL/min/{1.73_m2} (ref >90)
Glucose: 124 mg/dl (ref 70–140)
Potassium: 3.4 mEq/L — ABNORMAL LOW (ref 3.5–5.1)
Sodium: 135 mEq/L — ABNORMAL LOW (ref 136–145)
TOTAL PROTEIN: 6.8 g/dL (ref 6.4–8.3)

## 2015-12-18 LAB — CBC WITH DIFFERENTIAL/PLATELET
BASO%: 0.3 % (ref 0.0–2.0)
BASOS ABS: 0 10*3/uL (ref 0.0–0.1)
EOS%: 0.5 % (ref 0.0–7.0)
Eosinophils Absolute: 0 10*3/uL (ref 0.0–0.5)
HCT: 28.6 % — ABNORMAL LOW (ref 38.4–49.9)
HEMOGLOBIN: 9.7 g/dL — AB (ref 13.0–17.1)
LYMPH%: 9.5 % — ABNORMAL LOW (ref 14.0–49.0)
MCH: 33.9 pg — ABNORMAL HIGH (ref 27.2–33.4)
MCHC: 33.8 g/dL (ref 32.0–36.0)
MCV: 100.5 fL — ABNORMAL HIGH (ref 79.3–98.0)
MONO#: 0.1 10*3/uL (ref 0.1–0.9)
MONO%: 5.3 % (ref 0.0–14.0)
NEUT#: 1.5 10*3/uL (ref 1.5–6.5)
NEUT%: 84.4 % — ABNORMAL HIGH (ref 39.0–75.0)
Platelets: 217 10*3/uL (ref 140–400)
RBC: 2.85 10*6/uL — AB (ref 4.20–5.82)
RDW: 14.3 % (ref 11.0–14.6)
WBC: 1.7 10*3/uL — AB (ref 4.0–10.3)
lymph#: 0.2 10*3/uL — ABNORMAL LOW (ref 0.9–3.3)

## 2015-12-18 LAB — MAGNESIUM: Magnesium: 1.8 mg/dl (ref 1.5–2.5)

## 2015-12-18 MED ORDER — SODIUM CHLORIDE 0.9 % IJ SOLN
10.0000 mL | INTRAMUSCULAR | Status: DC | PRN
  Administered 2015-12-18: 10 mL
  Filled 2015-12-18: qty 10

## 2015-12-18 MED ORDER — DIPHENHYDRAMINE HCL 50 MG/ML IJ SOLN
INTRAMUSCULAR | Status: AC
  Filled 2015-12-18: qty 1

## 2015-12-18 MED ORDER — CETUXIMAB CHEMO IV INJECTION 200 MG/100ML
250.0000 mg/m2 | Freq: Once | INTRAVENOUS | Status: AC
  Administered 2015-12-18: 500 mg via INTRAVENOUS
  Filled 2015-12-18: qty 200

## 2015-12-18 MED ORDER — LORAZEPAM 0.5 MG PO TABS: ORAL_TABLET | ORAL | Status: DC

## 2015-12-18 MED ORDER — HEPARIN SOD (PORK) LOCK FLUSH 100 UNIT/ML IV SOLN
500.0000 [IU] | Freq: Once | INTRAVENOUS | Status: AC | PRN
  Administered 2015-12-18: 500 [IU]
  Filled 2015-12-18: qty 5

## 2015-12-18 MED ORDER — DIPHENHYDRAMINE HCL 50 MG/ML IJ SOLN
50.0000 mg | Freq: Once | INTRAMUSCULAR | Status: AC
  Administered 2015-12-18: 50 mg via INTRAVENOUS

## 2015-12-18 MED ORDER — SODIUM CHLORIDE 0.9 % IV SOLN
Freq: Once | INTRAVENOUS | Status: AC
  Administered 2015-12-18: 10:00:00 via INTRAVENOUS

## 2015-12-18 MED ORDER — SODIUM CHLORIDE 0.9 % IJ SOLN
10.0000 mL | INTRAMUSCULAR | Status: DC | PRN
Start: 2015-12-18 — End: 2015-12-18
  Administered 2015-12-18: 10 mL via INTRAVENOUS
  Filled 2015-12-18: qty 10

## 2015-12-18 MED FILL — LORazepam 0.5 MG TABS: 0.5 | 3 days supply | Qty: 6 | Fill #0

## 2015-12-18 NOTE — Patient Instructions (Signed)
[place] Discharge Instructions for Patients Receiving Chemotherapy  Today you received the following chemotherapy agents erbitux.   If you develop nausea and vomiting that is not controlled by your nausea medication, call the clinic.   BELOW ARE SYMPTOMS THAT SHOULD BE REPORTED IMMEDIATELY:  *FEVER GREATER THAN 100.5 F  *CHILLS WITH OR WITHOUT FEVER  NAUSEA AND VOMITING THAT IS NOT CONTROLLED WITH YOUR NAUSEA MEDICATION  *UNUSUAL SHORTNESS OF BREATH  *UNUSUAL BRUISING OR BLEEDING  TENDERNESS IN MOUTH AND THROAT WITH OR WITHOUT PRESENCE OF ULCERS  *URINARY PROBLEMS  *BOWEL PROBLEMS  UNUSUAL RASH Items with * indicate a potential emergency and should be followed up as soon as possible.  Feel free to call the clinic you have any questions or concerns. The clinic phone number is [phone].  Please show the [place] at check-in to the Emergency Department and triage nurse.

## 2015-12-18 NOTE — Progress Notes (Signed)
Patient ok to receive his erbitux today per Dr. Alvy Bimler.  Patient feeling fatigued, WBC- 1.7, suctioning his trach several times- has copious amounts of mucus, clear and thick.  Vital signs WNL.

## 2015-12-18 NOTE — Patient Instructions (Signed)

## 2015-12-24 MED FILL — LEVOTHYROXINE 25 MCG TABLET: 25 | 30 days supply | Qty: 30 | Fill #1

## 2015-12-25 ENCOUNTER — Ambulatory Visit (HOSPITAL_BASED_OUTPATIENT_CLINIC_OR_DEPARTMENT_OTHER): Payer: Medicaid Other

## 2015-12-25 ENCOUNTER — Other Ambulatory Visit (HOSPITAL_BASED_OUTPATIENT_CLINIC_OR_DEPARTMENT_OTHER): Payer: Medicaid Other

## 2015-12-25 ENCOUNTER — Ambulatory Visit: Payer: Medicaid Other

## 2015-12-25 VITALS — BP 125/76 | HR 93 | Temp 98.5°F | Resp 18

## 2015-12-25 DIAGNOSIS — C321 Malignant neoplasm of supraglottis: Secondary | ICD-10-CM

## 2015-12-25 DIAGNOSIS — Z95828 Presence of other vascular implants and grafts: Secondary | ICD-10-CM

## 2015-12-25 DIAGNOSIS — Z5112 Encounter for antineoplastic immunotherapy: Secondary | ICD-10-CM

## 2015-12-25 LAB — CBC WITH DIFFERENTIAL/PLATELET
BASO%: 0.2 % (ref 0.0–2.0)
BASOS ABS: 0 10*3/uL (ref 0.0–0.1)
EOS ABS: 0 10*3/uL (ref 0.0–0.5)
EOS%: 0.4 % (ref 0.0–7.0)
HCT: 26.3 % — ABNORMAL LOW (ref 38.4–49.9)
HGB: 9 g/dL — ABNORMAL LOW (ref 13.0–17.1)
LYMPH%: 19.1 % (ref 14.0–49.0)
MCH: 34.3 pg — ABNORMAL HIGH (ref 27.2–33.4)
MCHC: 34.1 g/dL (ref 32.0–36.0)
MCV: 100.6 fL — ABNORMAL HIGH (ref 79.3–98.0)
MONO#: 0.5 10*3/uL (ref 0.1–0.9)
MONO%: 22.5 % — ABNORMAL HIGH (ref 0.0–14.0)
NEUT#: 1.4 10*3/uL — ABNORMAL LOW (ref 1.5–6.5)
NEUT%: 57.8 % (ref 39.0–75.0)
PLATELETS: 80 10*3/uL — AB (ref 140–400)
RBC: 2.62 10*6/uL — AB (ref 4.20–5.82)
RDW: 15.2 % — ABNORMAL HIGH (ref 11.0–14.6)
WBC: 2.4 10*3/uL — ABNORMAL LOW (ref 4.0–10.3)
lymph#: 0.5 10*3/uL — ABNORMAL LOW (ref 0.9–3.3)

## 2015-12-25 LAB — COMPREHENSIVE METABOLIC PANEL
ALT: 12 U/L (ref 0–55)
AST: 15 U/L (ref 5–34)
Albumin: 3.5 g/dL (ref 3.5–5.0)
Alkaline Phosphatase: 59 U/L (ref 40–150)
Anion Gap: 9 mEq/L (ref 3–11)
BUN: 12 mg/dL (ref 7.0–26.0)
CALCIUM: 9.5 mg/dL (ref 8.4–10.4)
CHLORIDE: 101 meq/L (ref 98–109)
CO2: 29 meq/L (ref 22–29)
Creatinine: 0.8 mg/dL (ref 0.7–1.3)
EGFR: >90 mL/min/{1.73_m2} (ref >90)
Glucose: 117 mg/dl (ref 70–140)
POTASSIUM: 3.4 meq/L — AB (ref 3.5–5.1)
Sodium: 139 mEq/L (ref 136–145)
Total Bilirubin: 0.55 mg/dL (ref 0.20–1.20)
Total Protein: 7 g/dL (ref 6.4–8.3)

## 2015-12-25 LAB — MAGNESIUM: Magnesium: 1.8 mg/dL (ref 1.5–2.5)

## 2015-12-25 MED ORDER — DIPHENHYDRAMINE HCL 50 MG/ML IJ SOLN
50.0000 mg | Freq: Once | INTRAMUSCULAR | Status: AC
  Administered 2015-12-25: 50 mg via INTRAVENOUS

## 2015-12-25 MED ORDER — SODIUM CHLORIDE 0.9 % IJ SOLN
10.0000 mL | INTRAMUSCULAR | Status: DC | PRN
  Administered 2015-12-25: 10 mL via INTRAVENOUS
  Filled 2015-12-25: qty 10

## 2015-12-25 MED ORDER — HEPARIN SOD (PORK) LOCK FLUSH 100 UNIT/ML IV SOLN
500.0000 [IU] | Freq: Once | INTRAVENOUS | Status: AC | PRN
  Administered 2015-12-25: 500 [IU]
  Filled 2015-12-25: qty 5

## 2015-12-25 MED ORDER — DIPHENHYDRAMINE HCL 50 MG/ML IJ SOLN
INTRAMUSCULAR | Status: AC
  Filled 2015-12-25: qty 1

## 2015-12-25 MED ORDER — CETUXIMAB CHEMO IV INJECTION 200 MG/100ML
250.0000 mg/m2 | Freq: Once | INTRAVENOUS | Status: AC
  Administered 2015-12-25: 500 mg via INTRAVENOUS
  Filled 2015-12-25: qty 50

## 2015-12-25 MED ORDER — SODIUM CHLORIDE 0.9 % IJ SOLN
10.0000 mL | INTRAMUSCULAR | Status: DC | PRN
  Administered 2015-12-25: 10 mL
  Filled 2015-12-25: qty 10

## 2015-12-25 MED ORDER — SODIUM CHLORIDE 0.9 % IV SOLN
Freq: Once | INTRAVENOUS | Status: AC
  Administered 2015-12-25: 09:00:00 via INTRAVENOUS

## 2015-12-25 NOTE — Progress Notes (Signed)
Ok to treat w/ CBC results today per Dr. Alvy Bimler.

## 2015-12-25 NOTE — Patient Instructions (Signed)
[facility] Discharge Instructions for Patients Receiving Chemotherapy  Today you received the following chemotherapy agents Erbitux.  To help prevent nausea and vomiting after your treatment, we encourage you to take your nausea medication.   If you develop nausea and vomiting that is not controlled by your nausea medication, call the clinic.   BELOW ARE SYMPTOMS THAT SHOULD BE REPORTED IMMEDIATELY:  *FEVER GREATER THAN 100.5 F  *CHILLS WITH OR WITHOUT FEVER  NAUSEA AND VOMITING THAT IS NOT CONTROLLED WITH YOUR NAUSEA MEDICATION  *UNUSUAL SHORTNESS OF BREATH  *UNUSUAL BRUISING OR BLEEDING  TENDERNESS IN MOUTH AND THROAT WITH OR WITHOUT PRESENCE OF ULCERS  *URINARY PROBLEMS  *BOWEL PROBLEMS  UNUSUAL RASH Items with * indicate a potential emergency and should be followed up as soon as possible.  Feel free to call the clinic you have any questions or concerns. The clinic phone number is [phone].  Please show the CHEMO ALERT CARD at check-in to the Emergency Department and triage nurse.   

## 2015-12-28 ENCOUNTER — Ambulatory Visit: Payer: Medicaid Other

## 2015-12-28 ENCOUNTER — Ambulatory Visit: Payer: Self-pay

## 2015-12-30 ENCOUNTER — Encounter (HOSPITAL_COMMUNITY)
Admission: RE | Admit: 2015-12-30 | Discharge: 2015-12-30 | Disposition: A | Discharge status: Home / Self Care | Payer: Medicaid Other | Source: Ambulatory Visit | Attending: Hematology and Oncology | Admitting: Hematology and Oncology

## 2015-12-30 DIAGNOSIS — C321 Malignant neoplasm of supraglottis: Secondary | ICD-10-CM

## 2015-12-30 LAB — GLUCOSE, CAPILLARY: Glucose-Capillary: 97 mg/dL (ref 65–99)

## 2015-12-30 MED ORDER — FLUDEOXYGLUCOSE F - 18 (FDG) INJECTION
7.7000 | Freq: Once | INTRAVENOUS | Status: AC | PRN
  Administered 2015-12-30: 7.7 via INTRAVENOUS

## 2016-01-01 ENCOUNTER — Other Ambulatory Visit (HOSPITAL_BASED_OUTPATIENT_CLINIC_OR_DEPARTMENT_OTHER): Payer: Medicaid Other

## 2016-01-01 ENCOUNTER — Encounter: Payer: Self-pay | Admitting: Hematology and Oncology

## 2016-01-01 ENCOUNTER — Ambulatory Visit: Payer: Medicaid Other

## 2016-01-01 ENCOUNTER — Ambulatory Visit (HOSPITAL_BASED_OUTPATIENT_CLINIC_OR_DEPARTMENT_OTHER): Payer: Medicaid Other

## 2016-01-01 ENCOUNTER — Other Ambulatory Visit: Payer: Self-pay | Admitting: Hematology and Oncology

## 2016-01-01 ENCOUNTER — Telehealth: Payer: Self-pay | Admitting: Hematology and Oncology

## 2016-01-01 ENCOUNTER — Ambulatory Visit (HOSPITAL_BASED_OUTPATIENT_CLINIC_OR_DEPARTMENT_OTHER): Payer: Medicaid Other | Admitting: Hematology and Oncology

## 2016-01-01 ENCOUNTER — Other Ambulatory Visit: Payer: Self-pay

## 2016-01-01 VITALS — BP 118/71 | HR 90 | Temp 98.3°F | Resp 18 | Ht 70.0 in | Wt 155.8 lb

## 2016-01-01 DIAGNOSIS — Z720 Tobacco use: Secondary | ICD-10-CM

## 2016-01-01 DIAGNOSIS — R110 Nausea: Secondary | ICD-10-CM | POA: Diagnosis not present

## 2016-01-01 DIAGNOSIS — Z930 Tracheostomy status: Secondary | ICD-10-CM | POA: Diagnosis not present

## 2016-01-01 DIAGNOSIS — T451X5A Adverse effect of antineoplastic and immunosuppressive drugs, initial encounter: Secondary | ICD-10-CM

## 2016-01-01 DIAGNOSIS — Z5111 Encounter for antineoplastic chemotherapy: Secondary | ICD-10-CM

## 2016-01-01 DIAGNOSIS — Z931 Gastrostomy status: Secondary | ICD-10-CM | POA: Diagnosis not present

## 2016-01-01 DIAGNOSIS — Z5112 Encounter for antineoplastic immunotherapy: Secondary | ICD-10-CM | POA: Diagnosis not present

## 2016-01-01 DIAGNOSIS — C321 Malignant neoplasm of supraglottis: Secondary | ICD-10-CM | POA: Diagnosis not present

## 2016-01-01 DIAGNOSIS — Z95828 Presence of other vascular implants and grafts: Secondary | ICD-10-CM

## 2016-01-01 DIAGNOSIS — E43 Unspecified severe protein-calorie malnutrition: Secondary | ICD-10-CM

## 2016-01-01 LAB — CBC WITH DIFFERENTIAL/PLATELET
BASO%: 0.2 % (ref 0.0–2.0)
Basophils Absolute: 0 10*3/uL (ref 0.0–0.1)
EOS%: 0.6 % (ref 0.0–7.0)
Eosinophils Absolute: 0 10*3/uL (ref 0.0–0.5)
HCT: 25.5 % — ABNORMAL LOW (ref 38.4–49.9)
HGB: 8.8 g/dL — ABNORMAL LOW (ref 13.0–17.1)
LYMPH#: 0.7 10*3/uL — AB (ref 0.9–3.3)
LYMPH%: 14.2 % (ref 14.0–49.0)
MCH: 34.9 pg — ABNORMAL HIGH (ref 27.2–33.4)
MCHC: 34.5 g/dL (ref 32.0–36.0)
MCV: 101.2 fL — ABNORMAL HIGH (ref 79.3–98.0)
MONO#: 0.5 10*3/uL (ref 0.1–0.9)
MONO%: 10.5 % (ref 0.0–14.0)
NEUT%: 74.5 % (ref 39.0–75.0)
NEUTROS ABS: 3.5 10*3/uL (ref 1.5–6.5)
NRBC: 1 % — AB (ref 0–0)
PLATELETS: 75 10*3/uL — AB (ref 140–400)
RBC: 2.52 10*6/uL — AB (ref 4.20–5.82)
RDW: 18.5 % — AB (ref 11.0–14.6)
WBC: 4.7 10*3/uL (ref 4.0–10.3)

## 2016-01-01 LAB — COMPREHENSIVE METABOLIC PANEL
ALT: 11 U/L (ref 0–55)
AST: 15 U/L (ref 5–34)
Albumin: 3.4 g/dL — ABNORMAL LOW (ref 3.5–5.0)
Alkaline Phosphatase: 51 U/L (ref 40–150)
Anion Gap: 8 mEq/L (ref 3–11)
BUN: 10 mg/dL (ref 7.0–26.0)
CALCIUM: 9.3 mg/dL (ref 8.4–10.4)
CHLORIDE: 102 meq/L (ref 98–109)
CO2: 28 meq/L (ref 22–29)
CREATININE: 0.8 mg/dL (ref 0.7–1.3)
EGFR: >90 mL/min/{1.73_m2} (ref >90)
GLUCOSE: 96 mg/dL (ref 70–140)
POTASSIUM: 3.9 meq/L (ref 3.5–5.1)
SODIUM: 138 meq/L (ref 136–145)
Total Bilirubin: <0.3 mg/dL (ref 0.20–1.20)
Total Protein: 6.9 g/dL (ref 6.4–8.3)

## 2016-01-01 LAB — MAGNESIUM: MAGNESIUM: 1.8 mg/dL (ref 1.5–2.5)

## 2016-01-01 MED ORDER — CETUXIMAB CHEMO IV INJECTION 200 MG/100ML
250.0000 mg/m2 | Freq: Once | INTRAVENOUS | Status: AC
  Administered 2016-01-01: 500 mg via INTRAVENOUS
  Filled 2016-01-01: qty 250

## 2016-01-01 MED ORDER — SODIUM CHLORIDE 0.9 % IV SOLN
800.0000 mg/m2/d | INTRAVENOUS | Status: DC
  Administered 2016-01-01: 5900 mg via INTRAVENOUS
  Filled 2016-01-01: qty 118

## 2016-01-01 MED ORDER — SODIUM CHLORIDE 0.9 % IV SOLN
10.0000 mg | Freq: Once | INTRAVENOUS | Status: AC
  Administered 2016-01-01: 10 mg via INTRAVENOUS
  Filled 2016-01-01: qty 1

## 2016-01-01 MED ORDER — SODIUM CHLORIDE 0.9 % IJ SOLN
10.0000 mL | INTRAMUSCULAR | Status: DC | PRN
  Filled 2016-01-01: qty 10

## 2016-01-01 MED ORDER — DIPHENHYDRAMINE HCL 50 MG/ML IJ SOLN
INTRAMUSCULAR | Status: AC
  Filled 2016-01-01: qty 1

## 2016-01-01 MED ORDER — HEPARIN SOD (PORK) LOCK FLUSH 100 UNIT/ML IV SOLN
500.0000 [IU] | Freq: Once | INTRAVENOUS | Status: DC | PRN
Start: 2016-01-01 — End: 2016-01-01
  Filled 2016-01-01: qty 5

## 2016-01-01 MED ORDER — SODIUM CHLORIDE 0.9 % IV SOLN
522.8000 mg | Freq: Once | INTRAVENOUS | Status: AC
  Administered 2016-01-01: 520 mg via INTRAVENOUS
  Filled 2016-01-01: qty 52

## 2016-01-01 MED ORDER — SODIUM CHLORIDE 0.9 % IV SOLN
Freq: Once | INTRAVENOUS | Status: AC
  Administered 2016-01-01: 10:00:00 via INTRAVENOUS

## 2016-01-01 MED ORDER — PALONOSETRON HCL INJECTION 0.25 MG/5ML
0.2500 mg | Freq: Once | INTRAVENOUS | Status: AC
  Administered 2016-01-01: 0.25 mg via INTRAVENOUS

## 2016-01-01 MED ORDER — DIPHENHYDRAMINE HCL 50 MG/ML IJ SOLN
50.0000 mg | Freq: Once | INTRAMUSCULAR | Status: AC
  Administered 2016-01-01: 50 mg via INTRAVENOUS

## 2016-01-01 MED ORDER — PALONOSETRON HCL INJECTION 0.25 MG/5ML
INTRAVENOUS | Status: AC
  Filled 2016-01-01: qty 5

## 2016-01-01 MED ORDER — OXYCODONE HCL 5 MG/5ML PO SOLN: 5.0000 mg | Freq: Four times a day (QID) | ORAL | Status: DC | PRN

## 2016-01-01 NOTE — Assessment & Plan Note (Signed)
The feeding tube looks fine It was replaced recently and appears to be functioning well.

## 2016-01-01 NOTE — Assessment & Plan Note (Signed)
He felt that the tracheostomy is causing too much throat pain. His rated his pain as 8 out of 10. I recommend close follow-up with ENT. I refill his pain medication prescription

## 2016-01-01 NOTE — Patient Instructions (Signed)
[place] Discharge Instructions for Patients Receiving Chemotherapy  Today you received the following chemotherapy agents Carboplatin, Adrucil, Erbitux To help prevent nausea and vomiting after your treatment, we encourage you to take your nausea medication    If you develop nausea and vomiting that is not controlled by your nausea medication, call the clinic.   BELOW ARE SYMPTOMS THAT SHOULD BE REPORTED IMMEDIATELY:  *FEVER GREATER THAN 100.5 F  *CHILLS WITH OR WITHOUT FEVER  NAUSEA AND VOMITING THAT IS NOT CONTROLLED WITH YOUR NAUSEA MEDICATION  *UNUSUAL SHORTNESS OF BREATH  *UNUSUAL BRUISING OR BLEEDING  TENDERNESS IN MOUTH AND THROAT WITH OR WITHOUT PRESENCE OF ULCERS  *URINARY PROBLEMS  *BOWEL PROBLEMS  UNUSUAL RASH Items with * indicate a potential emergency and should be followed up as soon as possible.  Feel free to call the clinic you have any questions or concerns. The clinic phone number is [phone].  Please show the [place] at check-in to the Emergency Department and triage nurse.

## 2016-01-01 NOTE — Patient Instructions (Signed)

## 2016-01-01 NOTE — Progress Notes (Signed)
Okay to treat despite labs, per Dr. Gorsuch. 

## 2016-01-01 NOTE — Telephone Encounter (Signed)
Appointments complete per 1/20 pof - patient given updated schedule in inf area.

## 2016-01-01 NOTE — Assessment & Plan Note (Signed)
His currently using a nicotine patch. I continue to encourage him to quit smoking.

## 2016-01-01 NOTE — Assessment & Plan Note (Signed)
This is quite severe with last cycle of treatment. I will modify his anti-emetics to Aloxi and dexamethasone

## 2016-01-01 NOTE — Assessment & Plan Note (Signed)
He has positive response to chemotherapy. However, he is developing pancytopenia. I will proceed with treatment today but plan to reduce carboplatin to AUC of 4 and reduced 20% of the dose of the 5-FU infusion. I plan to proceed with 3 more cycles of treatment before repeating another PET scan.

## 2016-01-01 NOTE — Progress Notes (Signed)
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[place])   03/15/2015 - 03/17/2015 Hospital Admission He was admitted to the hospital for evaluation of dysphagia, SOB, hemoptosis, hoarseness, 30-40 pound weight loss and worsening bilateral neck masses for 5 months   03/15/2015 Imaging Ct showed extensive circumferential malignancy in the hypopharyngeal/supraglottic region with regional LN metastases   03/16/2015 Procedure He underwent ULTRASOUND-GUIDED BIOPSY OF LEFT CERVICAL LYMPH NODES   03/16/2015 Pathology Results Accession: [id number] LN biopsy showed invasive squamous cell cancer   03/16/2015 Pathology Results Accession: [id number] showed atypical squamous cells   03/25/2015 - 04/07/2015 Hospital Admission He was admitted to the hospital and underwent tracheostomy placement, feeeding tube placement but subsequently left [facility]   03/26/2015 Surgery He had multiple extraction of tooth numbers 1, 2, 5, 6, 7, 8, 9, 10, 11, 12, 13, 18, 19, 21, 22, 23, 24, 25, 26, 27, 28, and 29. and 4 Quadrants of alveoloplasty   03/26/2015 Surgery He underwent tracheostomy   03/31/2015 Surgery He had open gastrostomy tube placement by Dr. Donne Hazel   04/16/2015 - 05/18/2015 Radiation Therapy Laryngopharynx and bilateral neck / 50 Gy in 20 fractions to gross disease, 45 Gy in 20 fractions to high risk nodal echelons  Beams/energy: Helical IMRT / 6 MV photons   04/16/2015 Procedure Fluoroscopic reposition of the 18 French gastrostomy confirmed back in the stomach,   07/03/2015 Procedure IR performed replacement of gastrostomy tube with a new 42 French balloon retention tube   10/01/2015 Imaging PEt scan showed persistent hypermetabolism within the primary  supraglottic laryngeal tumor and within right retropharyngeal, bilateral level II and left level IV cervical nodal metastases   10/28/2015 Procedure He had placement of PICC line. The IR was not able to place PORT due to suspected upper respiratory infection   11/13/2015 -  Chemotherapy He received 5FU, carboplatin chemo with weekly Erbitux   11/19/2015 Surgery Gastrostomy tube replaced.   11/30/2015 Procedure Placement of right jugular port-a-cath.   11/30/2015 Procedure PICC removed.   12/30/2015 Imaging PET CT showed positive response to Rx    INTERVAL HISTORY: Please see below for problem oriented charting. He is seen prior to cycle 4 of treatment. He complained of throat pain, rated his throat pain at 8 out of 10 pain. He felt that it was related to the tracheostomy. He described his groin recently causing some bleeding. He also has severe nausea vomiting with his last cycle of treatment. He denies recent infection. He has very mild peripheral neuropathy.  REVIEW OF SYSTEMS:   Constitutional: Denies fevers, chills or abnormal weight loss Eyes: Denies blurriness of vision Respiratory: Denies cough, dyspnea or wheezes Cardiovascular: Denies palpitation, chest discomfort or lower extremity swelling Skin: Denies abnormal skin rashes Lymphatics: Denies new lymphadenopathy or easy bruising Neurological:Denies numbness, tingling or new weaknesses Behavioral/Psych: Mood is stable, no new changes  All other systems were reviewed with the patient and are negative.  I have reviewed the past medical history, past surgical history, social history and family history with the patient and they are unchanged from previous note.  ALLERGIES:  is allergic to aspirin.  MEDICATIONS:  Current Outpatient Prescriptions  Medication Sig Dispense Refill  . emollient (BIAFINE) cream Apply 1 application topically 2 (two) times  daily. Reported on 12/02/2015    . fluticasone (FLONASE) 50 MCG/ACT nasal spray  Place 2 sprays into both nostrils daily. 16 g 2  . ibuprofen (ADVIL,MOTRIN) 200 MG tablet Take 200 mg by mouth every 6 (six) hours as needed for mild pain. Reported on 12/11/2015    . levothyroxine (LEVOTHROID) 25 MCG tablet Take 1 tablet (25 mcg total) by mouth daily before breakfast. 30 tablet 3  . lidocaine-prilocaine (EMLA) cream Apply 1 application topically as needed. 30 g 0  . LORazepam (ATIVAN) 0.5 MG tablet Take 1-2 tablets before PET/ CT Scans. 6 tablet 0  . metoCLOPramide (REGLAN) 5 MG/5ML solution Place 5 mLs (5 mg total) into feeding tube 4 (four) times daily -  before meals and at bedtime. 240 mL 0  . nicotine (NICODERM CQ) 7 mg/24hr patch apply 7 mg patch daily x 4 wks after quitting smoking 14 patch 6  . Nutritional Supplements (FEEDING SUPPLEMENT, OSMOLITE 1.2 CAL,) LIQD Give 2 cans osmolite 1.2 QID with 60 cc free water before and after bolus feeding.  Give an additional 240 cc free water via PEG daily. 1896 mL 0  . omeprazole (PRILOSEC) 20 MG capsule Take 1 capsule by mouth daily.  6  . ondansetron (ZOFRAN) 8 MG tablet Take 1 tablet (8 mg total) by mouth every 8 (eight) hours as needed for nausea. 60 tablet 3  . oxyCODONE (ROXICODONE) 5 MG/5ML solution Take 5 mLs (5 mg total) by mouth every 6 (six) hours as needed. 473 mL 0  . oxymetazoline (AFRIN) 0.05 % nasal spray Place 1 spray into both nostrils as needed for congestion.    . polyethylene glycol (MIRALAX) packet Take 17 g by mouth daily. (Patient not taking: Reported on 12/02/2015) 14 each 0  . Sodium Chloride Flush (NORMAL SALINE FLUSH) 0.9 % SOLN Inject 10 mLs into the vein daily. 30 Syringe 0  . sodium chloride irrigation 0.9 % irrigation Irrigate with 1,000 mLs as directed daily. Irrigate Feeding tube as directed daily 1000 mL 11   No current facility-administered medications for this visit.   Facility-Administered Medications Ordered in Other Visits  Medication Dose Route Frequency Provider Last Rate Last Dose  .  CARBOplatin (PARAPLATIN) 520 mg in sodium chloride 0.9 % 250 mL chemo infusion  520 mg Intravenous Once [name], MD 302 mL/hr at 01/01/16 1124 520 mg at 01/01/16 1124  . fluorouracil (ADRUCIL) 5,900 mg in sodium chloride 0.9 % 132 mL chemo infusion  800 mg/m2/day (Treatment Plan Actual) Intravenous 4 days [name], MD      . heparin lock flush 100 unit/mL  500 Units Intracatheter Once PRN [name], MD      . sodium chloride 0.9 % injection 10 mL  10 mL Intracatheter PRN [name], MD        PHYSICAL EXAMINATION: ECOG PERFORMANCE STATUS: 1 - Symptomatic but completely ambulatory  Filed Vitals:   01/01/16 0902  BP: 118/71  Pulse: 90  Temp: 98.3 F (36.8 C)  Resp: 18   Filed Weights   01/01/16 0902  Weight: 155 lb 12.8 oz (70.67 kg)    GENERAL:alert, no distress and comfortable SKIN: skin color, texture, turgor are normal, no rashes or significant lesions EYES: normal, Conjunctiva are pink and non-injected, sclera clear OROPHARYNX: Tracheostomy in situ. NECK: supple, thyroid normal size, non-tender, without nodularity LYMPH:  no palpable lymphadenopathy in the cervical, axillary or inguinal LUNGS: clear to auscultation and percussion with normal breathing effort HEART: regular rate & rhythm and no  murmurs and no lower extremity edema ABDOMEN:abdomen soft, non-tender and normal bowel sounds Musculoskeletal:no cyanosis of digits and no clubbing  NEURO: alert & oriented x 3 with fluent speech, no focal motor/sensory deficits  LABORATORY DATA:  I have reviewed the data as listed    Component Value Date/Time   NA 138 01/01/2016 0835   NA 140 12/04/2015 0912   K 3.9 01/01/2016 0835   K 3.4* 12/04/2015 0912   CL 101 12/04/2015 0912   CO2 28 01/01/2016 0835   CO2 27 12/04/2015 0912   GLUCOSE 96 01/01/2016 0835   GLUCOSE 111* 12/04/2015 0912   BUN 10.0 01/01/2016 0835   BUN 14 12/04/2015 0912   CREATININE 0.8 01/01/2016 0835   CREATININE 0.69 12/04/2015 0912   CALCIUM  9.3 01/01/2016 0835   CALCIUM 9.5 12/04/2015 0912   PROT 6.9 01/01/2016 0835   PROT 7.3 12/04/2015 0912   ALBUMIN 3.4* 01/01/2016 0835   ALBUMIN 3.9 12/04/2015 0912   AST 15 01/01/2016 0835   AST 17 12/04/2015 0912   ALT 11 01/01/2016 0835   ALT 16* 12/04/2015 0912   ALKPHOS 51 01/01/2016 0835   ALKPHOS 57 12/04/2015 0912   BILITOT <0.30 01/01/2016 0835   BILITOT 0.4 12/04/2015 0912   GFRNONAA >60 12/04/2015 0912   GFRAA >60 12/04/2015 0912    No results found for: SPEP, UPEP  Lab Results  Component Value Date   WBC 4.7 01/01/2016   NEUTROABS 3.5 01/01/2016   HGB 8.8* 01/01/2016   HCT 25.5* 01/01/2016   MCV 101.2* 01/01/2016   PLT 75* 01/01/2016      Chemistry      Component Value Date/Time   NA 138 01/01/2016 0835   NA 140 12/04/2015 0912   K 3.9 01/01/2016 0835   K 3.4* 12/04/2015 0912   CL 101 12/04/2015 0912   CO2 28 01/01/2016 0835   CO2 27 12/04/2015 0912   BUN 10.0 01/01/2016 0835   BUN 14 12/04/2015 0912   CREATININE 0.8 01/01/2016 0835   CREATININE 0.69 12/04/2015 0912      Component Value Date/Time   CALCIUM 9.3 01/01/2016 0835   CALCIUM 9.5 12/04/2015 0912   ALKPHOS 51 01/01/2016 0835   ALKPHOS 57 12/04/2015 0912   AST 15 01/01/2016 0835   AST 17 12/04/2015 0912   ALT 11 01/01/2016 0835   ALT 16* 12/04/2015 0912   BILITOT <0.30 01/01/2016 0835   BILITOT 0.4 12/04/2015 0912       RADIOGRAPHIC STUDIES: I reviewed the PET CT scan with the patient I have personally reviewed the radiological images as listed and agreed with the findings in the report.    ASSESSMENT & PLAN:  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[facility]) He has positive response to chemotherapy. However, he is developing pancytopenia. I will proceed with treatment today but plan to reduce carboplatin to AUC of 4 and reduced 20% of the dose of the 5-FU infusion. I plan to proceed with 3 more cycles of treatment before repeating another PET scan.  Tobacco abuse His currently  using a nicotine patch. I continue to encourage him to quit smoking.     Chemotherapy-induced nausea This is quite severe with last cycle of treatment. I will modify his anti-emetics to Aloxi and dexamethasone  Gastrostomy tube in place [facility]) The feeding tube looks fine It was replaced recently and appears to be functioning well.    Tracheostomy status ([place]) He felt that the tracheostomy is causing too much throat pain. His rated  his pain as 8 out of 10. I recommend close follow-up with ENT. I refill his pain medication prescription   No orders of the defined types were placed in this encounter.   All questions were answered. The patient knows to call the clinic with any problems, questions or concerns. No barriers to learning was detected. I spent 40 minutes counseling the patient face to face. The total time spent in the appointment was 55 minutes and more than 50% was on counseling and review of test results     [facility], [place], MD 01/01/2016 12:16 PM

## 2016-01-02 ENCOUNTER — Encounter (HOSPITAL_COMMUNITY): Payer: Self-pay | Admitting: Emergency Medicine

## 2016-01-02 ENCOUNTER — Emergency Department (HOSPITAL_COMMUNITY): Payer: Medicaid Other

## 2016-01-02 ENCOUNTER — Emergency Department (HOSPITAL_COMMUNITY)
Admission: EM | Admit: 2016-01-02 | Discharge: 2016-01-02 | Disposition: A | Discharge status: Home / Self Care | Payer: Medicaid Other | Attending: Emergency Medicine | Admitting: Emergency Medicine

## 2016-01-02 DIAGNOSIS — R1084 Generalized abdominal pain: Secondary | ICD-10-CM | POA: Diagnosis not present

## 2016-01-02 DIAGNOSIS — K209 Esophagitis, unspecified without bleeding: Secondary | ICD-10-CM

## 2016-01-02 DIAGNOSIS — Z931 Gastrostomy status: Secondary | ICD-10-CM | POA: Diagnosis not present

## 2016-01-02 DIAGNOSIS — Z8579 Personal history of other malignant neoplasms of lymphoid, hematopoietic and related tissues: Secondary | ICD-10-CM | POA: Insufficient documentation

## 2016-01-02 DIAGNOSIS — I10 Essential (primary) hypertension: Secondary | ICD-10-CM | POA: Diagnosis not present

## 2016-01-02 DIAGNOSIS — F17210 Nicotine dependence, cigarettes, uncomplicated: Secondary | ICD-10-CM | POA: Insufficient documentation

## 2016-01-02 DIAGNOSIS — Z79899 Other long term (current) drug therapy: Secondary | ICD-10-CM | POA: Insufficient documentation

## 2016-01-02 DIAGNOSIS — K219 Gastro-esophageal reflux disease without esophagitis: Secondary | ICD-10-CM | POA: Diagnosis not present

## 2016-01-02 DIAGNOSIS — K920 Hematemesis: Secondary | ICD-10-CM | POA: Diagnosis present

## 2016-01-02 DIAGNOSIS — J392 Other diseases of pharynx: Secondary | ICD-10-CM

## 2016-01-02 DIAGNOSIS — R221 Localized swelling, mass and lump, neck: Secondary | ICD-10-CM

## 2016-01-02 DIAGNOSIS — Z923 Personal history of irradiation: Secondary | ICD-10-CM | POA: Diagnosis not present

## 2016-01-02 DIAGNOSIS — Z7951 Long term (current) use of inhaled steroids: Secondary | ICD-10-CM | POA: Insufficient documentation

## 2016-01-02 LAB — COMPREHENSIVE METABOLIC PANEL
ALK PHOS: 56 U/L (ref 38–126)
ALT: 15 U/L — ABNORMAL LOW (ref 17–63)
AST: 28 U/L (ref 15–41)
Albumin: 3.9 g/dL (ref 3.5–5.0)
Anion gap: 13 (ref 5–15)
BILIRUBIN TOTAL: 0.8 mg/dL (ref 0.3–1.2)
BUN: 16 mg/dL (ref 6–20)
CALCIUM: 9.8 mg/dL (ref 8.9–10.3)
CO2: 29 mmol/L (ref 22–32)
CREATININE: 0.87 mg/dL (ref 0.61–1.24)
Chloride: 99 mmol/L — ABNORMAL LOW (ref 101–111)
GFR calc non Af Amer: >60 mL/min (ref >60)
GLUCOSE: 122 mg/dL — AB (ref 65–99)
Potassium: 4.4 mmol/L (ref 3.5–5.1)
SODIUM: 141 mmol/L (ref 135–145)
Total Protein: 7.4 g/dL (ref 6.5–8.1)

## 2016-01-02 LAB — CBC WITH DIFFERENTIAL/PLATELET
BASOS PCT: 0 %
Basophils Absolute: 0 10*3/uL (ref 0.0–0.1)
EOS ABS: 0 10*3/uL (ref 0.0–0.7)
Eosinophils Relative: 0 %
HCT: 28.8 % — ABNORMAL LOW (ref 39.0–52.0)
HEMOGLOBIN: 9.9 g/dL — AB (ref 13.0–17.0)
LYMPHS ABS: 0.7 10*3/uL (ref 0.7–4.0)
LYMPHS PCT: 4 %
MCH: 35.2 pg — AB (ref 26.0–34.0)
MCHC: 34.4 g/dL (ref 30.0–36.0)
MCV: 102.5 fL — ABNORMAL HIGH (ref 78.0–100.0)
Monocytes Absolute: 1.5 10*3/uL — ABNORMAL HIGH (ref 0.1–1.0)
Monocytes Relative: 9 %
NEUTROS ABS: 14.1 10*3/uL — AB (ref 1.7–7.7)
Neutrophils Relative %: 87 %
Platelets: 117 10*3/uL — ABNORMAL LOW (ref 150–400)
RBC: 2.81 MIL/uL — ABNORMAL LOW (ref 4.22–5.81)
RDW: 19 % — AB (ref 11.5–15.5)
WBC: 16.3 10*3/uL — ABNORMAL HIGH (ref 4.0–10.5)

## 2016-01-02 LAB — I-STAT CHEM 8, ED
BUN: 17 mg/dL (ref 6–20)
CALCIUM ION: 1.2 mmol/L (ref 1.12–1.23)
CREATININE: 0.8 mg/dL (ref 0.61–1.24)
Chloride: 99 mmol/L — ABNORMAL LOW (ref 101–111)
GLUCOSE: 117 mg/dL — AB (ref 65–99)
HCT: 32 % — ABNORMAL LOW (ref 39.0–52.0)
Hemoglobin: 10.9 g/dL — ABNORMAL LOW (ref 13.0–17.0)
Potassium: 4.3 mmol/L (ref 3.5–5.1)
Sodium: 140 mmol/L (ref 135–145)
TCO2: 30 mmol/L (ref 0–100)

## 2016-01-02 LAB — LIPASE, BLOOD: Lipase: 50 U/L (ref 11–51)

## 2016-01-02 MED ORDER — LIDOCAINE VISCOUS 2 % MT SOLN: 15.0000 mL | Freq: Three times a day (TID) | OROMUCOSAL | Status: DC | PRN

## 2016-01-02 MED ORDER — LIDOCAINE VISCOUS 2 % MT SOLN
15.0000 mL | Freq: Once | OROMUCOSAL | Status: AC
  Administered 2016-01-02: 15 mL via OROMUCOSAL
  Filled 2016-01-02: qty 15

## 2016-01-02 MED ORDER — SODIUM CHLORIDE 0.9 % IV BOLUS (SEPSIS)
1000.0000 mL | Freq: Once | INTRAVENOUS | Status: AC
  Administered 2016-01-02: 1000 mL via INTRAVENOUS

## 2016-01-02 MED ORDER — HYDROMORPHONE HCL 1 MG/ML IJ SOLN
1.0000 mg | Freq: Once | INTRAMUSCULAR | Status: AC
  Administered 2016-01-02: 1 mg via INTRAVENOUS
  Filled 2016-01-02: qty 1

## 2016-01-02 MED ORDER — SODIUM CHLORIDE 0.9 % IV SOLN
8.0000 mg | Freq: Once | INTRAVENOUS | Status: AC
  Administered 2016-01-02: 8 mg via INTRAVENOUS
  Filled 2016-01-02: qty 4

## 2016-01-02 MED ORDER — OMEPRAZOLE 20 MG PO CPDR: 20.0000 mg | DELAYED_RELEASE_CAPSULE | Freq: Two times a day (BID) | ORAL | Status: DC

## 2016-01-02 MED ORDER — PANTOPRAZOLE SODIUM 40 MG IV SOLR
40.0000 mg | Freq: Once | INTRAVENOUS | Status: AC
  Administered 2016-01-02: 40 mg via INTRAVENOUS
  Filled 2016-01-02: qty 40

## 2016-01-02 MED ORDER — IOHEXOL 350 MG/ML SOLN
100.0000 mL | Freq: Once | INTRAVENOUS | Status: AC | PRN
  Administered 2016-01-02: 100 mL via INTRAVENOUS

## 2016-01-02 NOTE — ED Notes (Signed)
Pt reports emesis with blood since 0500 this am. Pt being treated for throat cancer. Pt on home chemo infusion. Pt unable to keep down medications. No diarrhea. No hx of liver problems.

## 2016-01-02 NOTE — ED Notes (Signed)
Awake. Verbally responsive. A/O x4. Resp even and unlabored. No audible adventitious breath sounds noted. ABC's intact. IV saline lock patent and intact. 

## 2016-01-02 NOTE — ED Provider Notes (Signed)
55 year old male on chemotherapy for head and neck cancer has trach, had some vomiting with streaks in it. No vomiting in the emergency department. Vital signs and stable. Hemoglobin better than usual. Patient tolerated PO. Scan showed esophagitis. Discussed with GI. They would increased PPI and treat symptoms. We'll have him follow-up with his oncologist for further symptoms. He is an appointment on Monday.  Courteney Julio Alm, MD 01/02/16 [id number]

## 2016-01-02 NOTE — ED Notes (Addendum)
Awake. Verbally responsive. A/O x4. Resp even and unlabored. No audible adventitious breath sounds noted. ABC's intact. IV saline lock patent and intact. 

## 2016-01-02 NOTE — ED Notes (Signed)
Initial charting in error.  Wrong patient.

## 2016-01-02 NOTE — ED Notes (Signed)
Bed: WA22 Expected date:  Expected time:  Means of arrival:  Comments: EMS 

## 2016-01-02 NOTE — ED Notes (Signed)
Awake. Verbally responsive. A/O x4. Resp even and unlabored. No audible adventitious breath sounds noted. ABC's intact. IV infusing NS without difficulty. 

## 2016-01-02 NOTE — ED Notes (Signed)
Awake. Verbally responsive. A/O x4. Resp even and unlabored. No audible adventitious breath sounds noted. ABC's intact.  

## 2016-01-02 NOTE — Discharge Instructions (Signed)
Please take the PPI (omeprazole) TWICE daily.  You can use lidocaine liquid 2 times a days for the next couple of days to help with symtoms. Please return immediatley if you have any more bleeding.   Esophagitis Esophagitis is inflammation of the esophagus. The esophagus is the tube that carries food and liquids from your mouth to your stomach. Esophagitis can cause soreness or pain in the esophagus. This condition can make it difficult and painful to swallow.  CAUSES Most causes of esophagitis are not serious. Common causes of this condition include:  Gastroesophageal reflux disease (GERD). This is when stomach contents move back up into the esophagus (reflux).  Repeated vomiting.  An allergic-type reaction, especially caused by food allergies (eosinophilic esophagitis).  Injury to the esophagus by swallowing large pills with or without water, or swallowing certain types of medicines.  Swallowing (ingesting) harmful chemicals, such as household cleaning products.  Heavy alcohol use.  An infection of the esophagus.This most often occurs in people who have a weakened immune system.  Radiation or chemotherapy treatment for cancer.  Certain diseases such as sarcoidosis, Crohn disease, and scleroderma. SYMPTOMS Symptoms of this condition include:  Difficult or painful swallowing.  Pain with swallowing acidic liquids, such as citrus juices.  Pain with burping.  Chest pain.  Difficulty breathing.  Nausea.  Vomiting.  Pain in the abdomen.  Weight loss.  Ulcers in the mouth.  Patches of white material in the mouth (candidiasis).  Fever.  Coughing up blood or vomiting blood.  Stool that is black, tarry, or bright red. DIAGNOSIS Your health care provider will take a medical history and perform a physical exam. You may also have other tests, including:  An endoscopy to examine your stomach and esophagus with a small camera.  A test that measures the acidity level in  your esophagus.  A test that measures how much pressure is on your esophagus.  A barium swallow or modified barium swallow to show the shape, size, and functioning of your esophagus.  Allergy tests. TREATMENT Treatment for this condition depends on the cause of your esophagitis. In some cases, steroids or other medicines may be given to help relieve your symptoms or to treat the underlying cause of your condition. You may have to make some lifestyle changes, such as:  Avoiding alcohol.  Quitting smoking.  Changing your diet.  Exercising.  Changing your sleep habits and your sleep environment. HOME CARE INSTRUCTIONS Take these actions to decrease your discomfort and to help avoid complications. Diet  Follow a diet as recommended by your health care provider. This may involve avoiding foods and drinks such as:  Coffee and tea (with or without caffeine).  Drinks that contain alcohol.  Energy drinks and sports drinks.  Carbonated drinks or sodas.  Chocolate and cocoa.  Peppermint and mint flavorings.  Garlic and onions.  Horseradish.  Spicy and acidic foods, including peppers, chili powder, curry powder, vinegar, hot sauces, and barbecue sauce.  Citrus fruit juices and citrus fruits, such as oranges, lemons, and limes.  Tomato-based foods, such as red sauce, chili, salsa, and pizza with red sauce.  Fried and fatty foods, such as donuts, french fries, potato chips, and high-fat dressings.  High-fat meats, such as hot dogs and fatty cuts of red and white meats, such as rib eye steak, sausage, ham, and bacon.  High-fat dairy items, such as whole milk, butter, and cream cheese.  Eat small, frequent meals instead of large meals.  Avoid drinking large amounts of  liquid with your meals.  Avoid eating meals during the 2-3 hours before bedtime.  Avoid lying down right after you eat.  Do not exercise right after you eat.  Avoid foods and drinks that seem to make your  symptoms worse. General Instructions  Pay attention to any changes in your symptoms.  Take over-the-counter and prescription medicines only as told by your health care provider. Do not take aspirin, ibuprofen, or other NSAIDs unless your health care provider told you to do so.  If you have trouble taking pills, use a pill splitter to decrease the size of the pill. This will decrease the chance of the pill getting stuck or injuring your esophagus on the way down. Also, drink water after you take a pill.  Do not use any tobacco products, including cigarettes, chewing tobacco, and e-cigarettes. If you need help quitting, ask your health care provider.  Wear loose-fitting clothing. Do not wear anything tight around your waist that causes pressure on your abdomen.  Raise (elevate) the head of your bed about 6 inches (15 cm).  Try to reduce your stress, such as with yoga or meditation. If you need help reducing stress, ask your health care provider.  If you are overweight, reduce your weight to an amount that is healthy for you. Ask your health care provider for guidance about a safe weight loss goal.  Keep all follow-up visits as told by your health care provider. This is important. SEEK MEDICAL CARE IF:  You have new symptoms.  You have unexplained weight loss.  You have difficulty swallowing, or it hurts to swallow.  You have wheezing or a persistent cough.  Your symptoms do not improve with treatment.  You have frequent heartburn for more than two weeks. SEEK IMMEDIATE MEDICAL CARE IF:  You have severe pain in your arms, neck, jaw, teeth, or back.  You feel sweaty, dizzy, or light-headed.  You have chest pain or shortness of breath.  You vomit and your vomit looks like blood or coffee grounds.  Your stool is bloody or black.  You have a fever.  You cannot swallow, drink, or eat.   This information is not intended to replace advice given to you by your health care  provider. Make sure you discuss any questions you have with your health care provider.   Document Released: 01/05/2005 Document Revised: 08/19/2015 Document Reviewed: 03/25/2015 Elsevier Interactive Patient Education [employer].

## 2016-01-02 NOTE — ED Provider Notes (Signed)
CSN: [id number]     Arrival date & time 01/02/16  1313 History   First MD Initiated Contact with Patient 01/02/16 1319     Chief Complaint  Patient presents with  . Hematemesis     (Consider location/radiation/quality/duration/timing/severity/associated sxs/prior Treatment) Patient is a 55 y.o. male presenting with general illness. The history is provided by the patient.  Illness Severity:  Moderate Onset quality:  Sudden Duration:  1 day Timing:  Constant Progression:  Unchanged Chronicity:  New Associated symptoms: abdominal pain, nausea and vomiting   Associated symptoms: no chest pain, no congestion, no diarrhea, no fever, no headaches, no myalgias, no rash and no shortness of breath     55 yo M with a chief complaints of vomiting blood. The started this morning. Patient is currently on chemotherapy based on a constant infusion at home through his port. Patient has been having some vomiting with some streaks of blood in it. Having some diffuse abdominal pain as well. Patient thinks that the reaction secondary to his chemotherapy is causing the persistent vomiting. However he is having some increasing throat pain as well.  Past Medical History  Diagnosis Date  . Hypertension   . Shortness of breath dyspnea   . GERD (gastroesophageal reflux disease)   . Seizures ([place])   . S/P radiation therapy 04/16/2015-05/18/2015    Laryngopharynx and bilateral neck / 50 Gy in 20 fractions to gross disease, 45 Gy in 20 fractions to high risk nodal echelons   . Heartburn symptom 10/19/2015  . Cancer ([place]) 03/16/15    left cervical lymph node / throat (2016)  . Nausea & vomiting 12/15/2015   Past Surgical History  Procedure Laterality Date  . Knee surgery  1998    Left  . Tonsillectomy and adenoidectomy    . Lymph node biopsy Left 03/16/15    left cervical, consistent with squamous cell carcinoma  . Hernia repair      right groin  . Tonsillectomy    . Tracheostomy tube placement N/A 03/26/2015     Procedure: TRACHEOSTOMY ;  Surgeon: [name], MD;  Location: [facility];  Service: ENT;  Laterality: N/A;  . Tooth extraction  03/26/2015    Procedure: [place];  Surgeon: [name], MD;  Location: [facility];  Service: ENT;;  . Multiple extractions with alveoloplasty N/A 03/26/2015    Procedure: EXTRACTION OF TOOTH #'S 1,2,5,6,7,8,9,10,11,12,13,18,19,21,22,23,24,25,26,27,28,29  WITH AVELOPLASTY;  Surgeon: Lenn Cal, DDS;  Location: [facility];  Service: Oral Surgery;  Laterality: N/A;  . Gastrostomy N/A 03/31/2015    Procedure: OPEN GASTROSTOMY TUBE PLACEMENT;  Surgeon: Rolm Bookbinder, MD;  Location: [facility];  Service: General;  Laterality: N/A;   Family History  Problem Relation Age of Onset  . Cancer Mother   . Diabetes Mother   . Diabetes Father   . Hypertension Father    Social History  Substance Use Topics  . Smoking status: Current Every Day Smoker -- 0.25 packs/day for 35 years    Types: Cigarettes  . Smokeless tobacco: Former Systems developer    Types: Chew  . Alcohol Use: 18.0 oz/week    30 Standard drinks or equivalent per week     Comment: occasional    Review of Systems  Constitutional: Negative for fever and chills.  HENT: Negative for congestion and facial swelling.        Throat pain  Eyes: Negative for discharge and visual disturbance.  Respiratory: Negative for shortness of breath.   Cardiovascular: Negative for chest pain and  palpitations.  Gastrointestinal: Positive for nausea, vomiting and abdominal pain. Negative for diarrhea.  Musculoskeletal: Negative for myalgias and arthralgias.  Skin: Negative for color change and rash.  Neurological: Negative for tremors, syncope and headaches.  Psychiatric/Behavioral: Negative for confusion and dysphoric mood.      Allergies  Aspirin  Home Medications   Prior to Admission medications   Medication Sig Start Date End Date Taking? Authorizing Provider  emollient (BIAFINE) cream Apply 1 application topically 2 (two) times  daily. Reported on 12/02/2015 04/17/15   [name], MD  fluticasone [facility]) 50 MCG/ACT nasal spray Place 2 sprays into both nostrils daily. 11/20/15   [name], MD  ibuprofen (ADVIL,MOTRIN) 200 MG tablet Take 200 mg by mouth every 6 (six) hours as needed for mild pain. Reported on 12/11/2015    Historical Provider, MD  levothyroxine (LEVOTHROID) 25 MCG tablet Take 1 tablet (25 mcg total) by mouth daily before breakfast. 11/27/15   [name], MD  lidocaine-prilocaine (EMLA) cream Apply 1 application topically as needed. 12/01/15   [name], MD  LORazepam (ATIVAN) 0.5 MG tablet Take 1-2 tablets before PET/ CT Scans. 12/18/15   [name], MD  metoCLOPramide (REGLAN) 5 MG/5ML solution Place 5 mLs (5 mg total) into feeding tube 4 (four) times daily -  before meals and at bedtime. 12/15/15   [name], MD  nicotine (NICODERM CQ) 7 mg/24hr patch apply 7 mg patch daily x 4 wks after quitting smoking 12/11/15   [name], MD  Nutritional Supplements (FEEDING SUPPLEMENT, OSMOLITE 1.2 CAL,) LIQD Give 2 cans osmolite 1.2 QID with 60 cc free water before and after bolus feeding.  Give an additional 240 cc free water via PEG daily. 04/27/15   [name], MD  omeprazole (PRILOSEC) 20 MG capsule Take 1 capsule by mouth daily. 10/19/15   Historical Provider, MD  ondansetron (ZOFRAN) 8 MG tablet Take 1 tablet (8 mg total) by mouth every 8 (eight) hours as needed for nausea. 12/15/15   [name], MD  oxyCODONE (ROXICODONE) 5 MG/5ML solution Take 5 mLs (5 mg total) by mouth every 6 (six) hours as needed. 01/01/16   [name], MD  oxymetazoline (AFRIN) 0.05 % nasal spray Place 1 spray into both nostrils as needed for congestion.    Historical Provider, MD  polyethylene glycol (MIRALAX) packet Take 17 g by mouth daily. Patient not taking: Reported on 12/02/2015 10/19/15   [name], MD  Sodium Chloride Flush (NORMAL SALINE FLUSH) 0.9 % SOLN Inject 10 mLs into the vein daily. 10/30/15   [name], MD  sodium chloride  irrigation 0.9 % irrigation Irrigate with 1,000 mLs as directed daily. Irrigate Feeding tube as directed daily 12/15/15   Ni Gorsuch, MD   BP 121/81 mmHg  Pulse 100  Temp(Src) 98.2 F (36.8 C) (Oral)  Resp 16  SpO2 100% Physical Exam  Constitutional: He is oriented to person, place, and time. He appears well-developed and well-nourished.  HENT:  Head: Normocephalic and atraumatic.  No noted signs of bleeding on the oropharynx  Eyes: EOM are normal. Pupils are equal, round, and reactive to light.  Neck: Normal range of motion. Neck supple. No JVD present.  Cardiovascular: Normal rate and regular rhythm.  Exam reveals no gallop and no friction rub.   No murmur heard. Pulmonary/Chest: No respiratory distress. He has no wheezes.  Abdominal: He exhibits no distension. There is tenderness (diffuse). There is no rebound and no guarding.  G tube in place  Musculoskeletal: Normal range  of motion.  Neurological: He is alert and oriented to person, place, and time.  Skin: No rash noted. No pallor.  Psychiatric: He has a normal mood and affect. His behavior is normal.  Nursing note and vitals reviewed.   ED Course  Procedures (including critical care time) Labs Review Labs Reviewed  CBC WITH DIFFERENTIAL/PLATELET - Abnormal; Notable for the following:    WBC 16.3 (*)    RBC 2.81 (*)    Hemoglobin 9.9 (*)    HCT 28.8 (*)    MCV 102.5 (*)    MCH 35.2 (*)    RDW 19.0 (*)    All other components within normal limits  COMPREHENSIVE METABOLIC PANEL - Abnormal; Notable for the following:    Chloride 99 (*)    Glucose, Bld 122 (*)    ALT 15 (*)    All other components within normal limits  I-STAT CHEM 8, ED - Abnormal; Notable for the following:    Chloride 99 (*)    Glucose, Bld 117 (*)    Hemoglobin 10.9 (*)    HCT 32.0 (*)    All other components within normal limits  LIPASE, BLOOD    Imaging Review Dg Chest 2 View  01/02/2016  CLINICAL DATA:  55 year old male with hematemesis.  Currently being treated for throat cancer. EXAM: CHEST  2 VIEW COMPARISON:  Recent PET-CT 12/30/2015 FINDINGS: Tracheostomy tube tip is midline and at the level of the clavicles. There is a right IJ approach single-lumen power injectable port catheter with the catheter tip well positioned at the superior cavoatrial junction. Cardiac and mediastinal contours are normal. The lungs are clear. No acute osseous abnormality. The peg tube noted incidentally on the lateral view. IMPRESSION: 1. No acute cardiopulmonary process. 2. Well-positioned right IJ approach single-lumen power injectable port catheter, tracheostomy tube and percutaneous gastrostomy tube. Electronically Signed   By: Jacqulynn Cadet M.D.   On: 01/02/2016 14:40   I have personally reviewed and evaluated these images and lab results as part of my medical decision-making.   EKG Interpretation None      MDM   Final diagnoses:  Throat mass    55 yo M with a cc of vomiting.  Patient unable to tolerate PO at home.  Patient unable to localize site of bleeding, though having emesis in the room without coughing, likely GI.  Hbg improved from baseline.  CXR unremarkable.  CT chest abd pelvis, neck ordered with many possible sites of bleeding.   Patient reassessed and feeling mildly better.  Still unable to drink.  Will give second dose of zofran.  Awaiting CT results.  Case discussed and turned over to Dr. Thomasene Lot.    The patients results and plan were reviewed and discussed.   Any x-rays performed were independently reviewed by myself.   Differential diagnosis were considered with the presenting HPI.  Medications  ondansetron (ZOFRAN) 8 mg in sodium chloride 0.9 % 50 mL IVPB (not administered)  sodium chloride 0.9 % bolus 1,000 mL (0 mLs Intravenous Stopped 01/02/16 1519)  ondansetron (ZOFRAN) 8 mg in sodium chloride 0.9 % 50 mL IVPB (8 mg Intravenous Given 01/02/16 1448)  HYDROmorphone (DILAUDID) injection 1 mg (1 mg Intravenous  Given 01/02/16 1418)  pantoprazole (PROTONIX) injection 40 mg (40 mg Intravenous Given 01/02/16 1448)  iohexol (OMNIPAQUE) 350 MG/ML injection 100 mL (100 mLs Intravenous Contrast Given 01/02/16 1502)    Filed Vitals:   01/02/16 1326 01/02/16 1411  BP: 119/74 121/81  Pulse: 97 100  Temp: 98.2 F (36.8 C)   TempSrc: Oral   Resp: 18 16  SpO2: 100% 100%    Final diagnoses:  Throat mass      Deno Etienne, DO 01/02/16 1547

## 2016-01-02 NOTE — ED Notes (Signed)
Patient transported to X-ray 

## 2016-01-02 NOTE — ED Notes (Signed)
Pt reported n/v with bright red bleeding and abd pain noted, denies diarrhea.

## 2016-01-04 ENCOUNTER — Encounter (HOSPITAL_COMMUNITY): Payer: Self-pay | Admitting: Dentistry

## 2016-01-05 ENCOUNTER — Other Ambulatory Visit: Payer: Self-pay | Admitting: Hematology and Oncology

## 2016-01-05 ENCOUNTER — Telehealth: Payer: Self-pay | Admitting: Hematology and Oncology

## 2016-01-05 ENCOUNTER — Ambulatory Visit (HOSPITAL_BASED_OUTPATIENT_CLINIC_OR_DEPARTMENT_OTHER): Payer: Medicaid Other

## 2016-01-05 ENCOUNTER — Telehealth: Payer: Self-pay | Admitting: *Deleted

## 2016-01-05 VITALS — BP 114/65 | HR 88 | Temp 98.3°F | Resp 14

## 2016-01-05 DIAGNOSIS — R197 Diarrhea, unspecified: Secondary | ICD-10-CM

## 2016-01-05 DIAGNOSIS — C321 Malignant neoplasm of supraglottis: Secondary | ICD-10-CM

## 2016-01-05 MED ORDER — SODIUM CHLORIDE 0.9 % IV SOLN
10.0000 mg | Freq: Once | INTRAVENOUS | Status: AC
  Administered 2016-01-05: 10 mg via INTRAVENOUS
  Filled 2016-01-05: qty 1

## 2016-01-05 MED ORDER — SODIUM CHLORIDE 0.9 % IV SOLN
Freq: Once | INTRAVENOUS | Status: AC
  Administered 2016-01-05: 15:00:00 via INTRAVENOUS
  Filled 2016-01-05: qty 4

## 2016-01-05 MED ORDER — SODIUM CHLORIDE 0.9 % IJ SOLN
10.0000 mL | INTRAMUSCULAR | Status: DC | PRN
  Administered 2016-01-05: 10 mL
  Filled 2016-01-05: qty 10

## 2016-01-05 MED ORDER — SODIUM CHLORIDE 0.9 % IV SOLN
Freq: Once | INTRAVENOUS | Status: AC
  Administered 2016-01-05: 14:00:00 via INTRAVENOUS

## 2016-01-05 MED ORDER — HEPARIN SOD (PORK) LOCK FLUSH 100 UNIT/ML IV SOLN
500.0000 [IU] | Freq: Once | INTRAVENOUS | Status: AC | PRN
  Administered 2016-01-05: 500 [IU]
  Filled 2016-01-05: qty 5

## 2016-01-05 MED FILL — LIDOCAINE 2% VISCOUS SOLN: 2 | 2 days supply | Qty: 100 | Fill #0

## 2016-01-05 MED FILL — OMEPRAZOLE DR 20 MG CAPSULE: 20 | 30 days supply | Qty: 60 | Fill #0

## 2016-01-05 MED FILL — ONDANSETRON HCL 8 MG TABLET: 8 | 20 days supply | Qty: 60 | Fill #1

## 2016-01-05 MED FILL — oxyCODONE HCL 5 MG/5ML SOLN: 5 | 23 days supply | Qty: 473 | Fill #0

## 2016-01-05 MED FILL — SULFAMETHOXAZOLE-TMP SUSP: 200-40 | 15 days supply | Qty: 600 | Fill #0

## 2016-01-05 NOTE — Telephone Encounter (Signed)
per pfo to sch pt appt-cld *spoke to pt and gaqve pt time & date of appt for 1/25-1/26

## 2016-01-05 NOTE — Progress Notes (Signed)
Pt was asking to see Dr Alvy Bimler before his Friday appt. He described vomiting so much he went to ER on Saturday. He stated he felt better today but he is unable to eat. He can swallow fluids and he can put osmolite in his PEG but he will almost immediately have diarrhea. He has been having this since Saturday. I s/w Dr Alvy Bimler and she ordered antiemetics and fluids. Appts were set up for remainder of week for fluids. Pt was agreeable to this. He stated he was OK not seeing Dr Alvy Bimler Friday since she is aware of his condition and he is getting fluids. An inbasket was sent to Dr Alvy Bimler asking if his chemo needs to be modified Friday.

## 2016-01-05 NOTE — Telephone Encounter (Signed)
per pof to sch pt appt-sent email to MW to sch IV fluids-will call pt after reply

## 2016-01-05 NOTE — Telephone Encounter (Signed)
Per staff message and POF I have scheduled appts. Advised scheduler of appts. JMW  

## 2016-01-06 ENCOUNTER — Ambulatory Visit (HOSPITAL_BASED_OUTPATIENT_CLINIC_OR_DEPARTMENT_OTHER): Payer: Medicaid Other

## 2016-01-06 VITALS — BP 108/65 | HR 68 | Temp 98.8°F

## 2016-01-06 DIAGNOSIS — C321 Malignant neoplasm of supraglottis: Secondary | ICD-10-CM | POA: Diagnosis not present

## 2016-01-06 DIAGNOSIS — R197 Diarrhea, unspecified: Secondary | ICD-10-CM | POA: Diagnosis present

## 2016-01-06 MED ORDER — HEPARIN SOD (PORK) LOCK FLUSH 100 UNIT/ML IV SOLN
500.0000 [IU] | Freq: Once | INTRAVENOUS | Status: AC | PRN
  Administered 2016-01-06: 500 [IU]
  Filled 2016-01-06: qty 5

## 2016-01-06 MED ORDER — ONDANSETRON HCL 40 MG/20ML IJ SOLN: Freq: Once | INTRAMUSCULAR | Status: DC

## 2016-01-06 MED ORDER — SODIUM CHLORIDE 0.9 % IV SOLN
Freq: Once | INTRAVENOUS | Status: AC
  Administered 2016-01-06: 15:00:00 via INTRAVENOUS

## 2016-01-06 MED ORDER — SODIUM CHLORIDE 0.9 % IV SOLN
Freq: Once | INTRAVENOUS | Status: AC
  Administered 2016-01-06: 16:00:00 via INTRAVENOUS
  Filled 2016-01-06: qty 4

## 2016-01-06 MED ORDER — SODIUM CHLORIDE 0.9 % IJ SOLN
10.0000 mL | INTRAMUSCULAR | Status: DC | PRN
Start: 2016-01-06 — End: 2016-01-06
  Administered 2016-01-06: 10 mL
  Filled 2016-01-06: qty 10

## 2016-01-06 MED ORDER — SODIUM CHLORIDE 0.9 % IV SOLN: 10.0000 mg | Freq: Once | INTRAVENOUS | Status: DC

## 2016-01-06 NOTE — Patient Instructions (Signed)

## 2016-01-07 ENCOUNTER — Ambulatory Visit: Payer: Self-pay

## 2016-01-07 ENCOUNTER — Telehealth: Payer: Self-pay | Admitting: *Deleted

## 2016-01-07 NOTE — Telephone Encounter (Signed)
Pt called earlier stating he having difficulty finding a ride to come in for IVFs today.  I called him back an hour later and he said he still doesn't have a ride and will need to cancel today.  He says he is feeling a lot better.  He will be here tomorrow as scheduled.   He asked if Dr. Alvy Bimler got results of a "culture"  Dr. [name] did on his groin?    Instructed him to ask her on MD visit tomorrow.  He verbalized understanding.

## 2016-01-08 ENCOUNTER — Ambulatory Visit: Payer: Medicaid Other

## 2016-01-08 ENCOUNTER — Ambulatory Visit (HOSPITAL_BASED_OUTPATIENT_CLINIC_OR_DEPARTMENT_OTHER): Payer: Medicaid Other

## 2016-01-08 ENCOUNTER — Telehealth: Payer: Self-pay

## 2016-01-08 ENCOUNTER — Other Ambulatory Visit (HOSPITAL_BASED_OUTPATIENT_CLINIC_OR_DEPARTMENT_OTHER): Payer: Medicaid Other

## 2016-01-08 ENCOUNTER — Other Ambulatory Visit: Payer: Self-pay | Admitting: Oncology

## 2016-01-08 VITALS — BP 123/84 | HR 86 | Temp 98.3°F | Resp 16

## 2016-01-08 DIAGNOSIS — Z95828 Presence of other vascular implants and grafts: Secondary | ICD-10-CM

## 2016-01-08 DIAGNOSIS — C321 Malignant neoplasm of supraglottis: Secondary | ICD-10-CM | POA: Diagnosis not present

## 2016-01-08 DIAGNOSIS — Z5112 Encounter for antineoplastic immunotherapy: Secondary | ICD-10-CM | POA: Diagnosis not present

## 2016-01-08 LAB — COMPREHENSIVE METABOLIC PANEL
ALT: 11 U/L (ref 0–55)
ANION GAP: 7 meq/L (ref 3–11)
AST: 13 U/L (ref 5–34)
Albumin: 3.3 g/dL — ABNORMAL LOW (ref 3.5–5.0)
Alkaline Phosphatase: 47 U/L (ref 40–150)
BUN: 13.7 mg/dL (ref 7.0–26.0)
CALCIUM: 8.6 mg/dL (ref 8.4–10.4)
CHLORIDE: 105 meq/L (ref 98–109)
CO2: 28 meq/L (ref 22–29)
Creatinine: 0.7 mg/dL (ref 0.7–1.3)
Glucose: 89 mg/dl (ref 70–140)
POTASSIUM: 3.2 meq/L — AB (ref 3.5–5.1)
Sodium: 139 mEq/L (ref 136–145)
Total Bilirubin: <0.3 mg/dL (ref 0.20–1.20)
Total Protein: 6.3 g/dL — ABNORMAL LOW (ref 6.4–8.3)

## 2016-01-08 LAB — CBC WITH DIFFERENTIAL/PLATELET
BASO%: 0.2 % (ref 0.0–2.0)
BASOS ABS: 0 10*3/uL (ref 0.0–0.1)
EOS ABS: 0 10*3/uL (ref 0.0–0.5)
EOS%: 0.3 % (ref 0.0–7.0)
HCT: 25.8 % — ABNORMAL LOW (ref 38.4–49.9)
HGB: 8.8 g/dL — ABNORMAL LOW (ref 13.0–17.1)
LYMPH%: 9 % — AB (ref 14.0–49.0)
MCH: 35.3 pg — AB (ref 27.2–33.4)
MCHC: 34.2 g/dL (ref 32.0–36.0)
MCV: 103.1 fL — AB (ref 79.3–98.0)
MONO#: 0.2 10*3/uL (ref 0.1–0.9)
MONO%: 3.5 % (ref 0.0–14.0)
NEUT%: 87 % — AB (ref 39.0–75.0)
NEUTROS ABS: 5.2 10*3/uL (ref 1.5–6.5)
PLATELETS: 183 10*3/uL (ref 140–400)
RBC: 2.5 10*6/uL — AB (ref 4.20–5.82)
RDW: 20.9 % — ABNORMAL HIGH (ref 11.0–14.6)
WBC: 6 10*3/uL (ref 4.0–10.3)
lymph#: 0.5 10*3/uL — ABNORMAL LOW (ref 0.9–3.3)

## 2016-01-08 LAB — MAGNESIUM: MAGNESIUM: 1.9 mg/dL (ref 1.5–2.5)

## 2016-01-08 MED ORDER — DIPHENHYDRAMINE HCL 50 MG/ML IJ SOLN
INTRAMUSCULAR | Status: AC
  Filled 2016-01-08: qty 1

## 2016-01-08 MED ORDER — CETUXIMAB CHEMO IV INJECTION 200 MG/100ML
250.0000 mg/m2 | Freq: Once | INTRAVENOUS | Status: AC
  Administered 2016-01-08: 500 mg via INTRAVENOUS
  Filled 2016-01-08: qty 250

## 2016-01-08 MED ORDER — SODIUM CHLORIDE 0.9% FLUSH
10.0000 mL | INTRAVENOUS | Status: DC | PRN
  Administered 2016-01-08: 10 mL via INTRAVENOUS
  Filled 2016-01-08: qty 10

## 2016-01-08 MED ORDER — DIPHENHYDRAMINE HCL 50 MG/ML IJ SOLN
50.0000 mg | Freq: Once | INTRAMUSCULAR | Status: AC
  Administered 2016-01-08: 50 mg via INTRAVENOUS

## 2016-01-08 MED ORDER — HEPARIN SOD (PORK) LOCK FLUSH 100 UNIT/ML IV SOLN
500.0000 [IU] | Freq: Once | INTRAVENOUS | Status: AC | PRN
  Administered 2016-01-08: 500 [IU]
  Filled 2016-01-08: qty 5

## 2016-01-08 MED ORDER — SODIUM CHLORIDE 0.9 % IV SOLN
Freq: Once | INTRAVENOUS | Status: AC
  Administered 2016-01-08: 12:00:00 via INTRAVENOUS

## 2016-01-08 MED ORDER — SODIUM CHLORIDE 0.9 % IJ SOLN
10.0000 mL | INTRAMUSCULAR | Status: DC | PRN
  Administered 2016-01-08: 10 mL
  Filled 2016-01-08: qty 10

## 2016-01-08 MED ORDER — SODIUM CHLORIDE 0.9 % IV SOLN
Freq: Once | INTRAVENOUS | Status: AC
  Administered 2016-01-08: 12:00:00 via INTRAVENOUS
  Filled 2016-01-08: qty 4

## 2016-01-08 NOTE — Telephone Encounter (Signed)
-----   Message from [name], MD sent at 01/06/2016  7:51 AM EST ----- Regarding: RE: chemo dosage He only gets cetuximab on Fri. No dose adjustment needed I will adjust his chemo dose on 2/10 ----- Message -----    From: Janace Hoard, RN    Sent: 01/05/2016   4:52 PM      To: [name], MD Subject: chemo dosage                                   Kyle Alexander was initially asking to see you on Friday before chemo. He is OK with not seeing you b/c you are aware of his ER visit on Saturday and his getting fluids this week. He did ask if he needs his chemo modified this next cycle.

## 2016-01-08 NOTE — Telephone Encounter (Signed)
I left voice message about Dr Calton Dach attached message.

## 2016-01-08 NOTE — Patient Instructions (Signed)

## 2016-01-08 NOTE — Patient Instructions (Signed)
[facility] Discharge Instructions for Patients Receiving Chemotherapy  Today you received the following chemotherapy agents erbitux  To help prevent nausea and vomiting after your treatment, we encourage you to take your nausea medication as directed  If you develop nausea and vomiting that is not controlled by your nausea medication, call the clinic.   BELOW ARE SYMPTOMS THAT SHOULD BE REPORTED IMMEDIATELY:  *FEVER GREATER THAN 100.5 F  *CHILLS WITH OR WITHOUT FEVER  NAUSEA AND VOMITING THAT IS NOT CONTROLLED WITH YOUR NAUSEA MEDICATION  *UNUSUAL SHORTNESS OF BREATH  *UNUSUAL BRUISING OR BLEEDING  TENDERNESS IN MOUTH AND THROAT WITH OR WITHOUT PRESENCE OF ULCERS  *URINARY PROBLEMS  *BOWEL PROBLEMS  UNUSUAL RASH Items with * indicate a potential emergency and should be followed up as soon as possible.  Feel free to call the clinic you have any questions or concerns. The clinic phone number is [phone].  

## 2016-01-11 ENCOUNTER — Ambulatory Visit: Payer: Self-pay

## 2016-01-11 ENCOUNTER — Ambulatory Visit: Payer: Medicaid Other | Attending: Radiation Oncology

## 2016-01-12 ENCOUNTER — Telehealth: Payer: Self-pay

## 2016-01-12 NOTE — Telephone Encounter (Signed)
Can you call [facility] Dr. Donne Hazel placed open G tube on him last year Can they work him in tomorrow to drain groin abscess?

## 2016-01-12 NOTE — Telephone Encounter (Signed)
Called [facility] and they can see him at 1450. Faxed last OV note to [facility]. Called pt with appt.

## 2016-01-12 NOTE — Telephone Encounter (Signed)
Knot in groin area, bigger than quarter, red and drainage. Dr [name] did culture a week ago yesterday. Painful, not able to get in comfortable position. Pt taking sulfamethoxazole-tmp suspension, 20 ml BID. The area has gotten worse. Pt is unsure what to do. I left voice message at Dr [name] office to get the culture results. He would have transportation problems today but would be able to come in tomorrow.

## 2016-01-15 ENCOUNTER — Ambulatory Visit: Payer: Medicaid Other

## 2016-01-15 ENCOUNTER — Ambulatory Visit (HOSPITAL_BASED_OUTPATIENT_CLINIC_OR_DEPARTMENT_OTHER): Payer: Medicaid Other

## 2016-01-15 ENCOUNTER — Other Ambulatory Visit (HOSPITAL_BASED_OUTPATIENT_CLINIC_OR_DEPARTMENT_OTHER): Payer: Medicaid Other

## 2016-01-15 VITALS — BP 113/57 | HR 73 | Temp 98.6°F | Resp 18

## 2016-01-15 DIAGNOSIS — Z95828 Presence of other vascular implants and grafts: Secondary | ICD-10-CM

## 2016-01-15 DIAGNOSIS — C321 Malignant neoplasm of supraglottis: Secondary | ICD-10-CM

## 2016-01-15 DIAGNOSIS — Z5112 Encounter for antineoplastic immunotherapy: Secondary | ICD-10-CM

## 2016-01-15 LAB — COMPREHENSIVE METABOLIC PANEL
ALT: 10 U/L (ref 0–55)
ANION GAP: 10 meq/L (ref 3–11)
AST: 15 U/L (ref 5–34)
Albumin: 3.5 g/dL (ref 3.5–5.0)
Alkaline Phosphatase: 51 U/L (ref 40–150)
BUN: 10.2 mg/dL (ref 7.0–26.0)
CALCIUM: 9.2 mg/dL (ref 8.4–10.4)
CO2: 27 mEq/L (ref 22–29)
CREATININE: 0.8 mg/dL (ref 0.7–1.3)
Chloride: 101 mEq/L (ref 98–109)
EGFR: >90 mL/min/{1.73_m2} (ref >90)
Glucose: 92 mg/dl (ref 70–140)
Potassium: 3.7 mEq/L (ref 3.5–5.1)
Sodium: 138 mEq/L (ref 136–145)
TOTAL PROTEIN: 6.8 g/dL (ref 6.4–8.3)

## 2016-01-15 LAB — CBC WITH DIFFERENTIAL/PLATELET
BASO%: 0 % (ref 0.0–2.0)
Basophils Absolute: 0 10*3/uL (ref 0.0–0.1)
EOS ABS: 0 10*3/uL (ref 0.0–0.5)
EOS%: 0.7 % (ref 0.0–7.0)
HCT: 24 % — ABNORMAL LOW (ref 38.4–49.9)
HEMOGLOBIN: 8.3 g/dL — AB (ref 13.0–17.1)
LYMPH%: 14.7 % (ref 14.0–49.0)
MCH: 36.1 pg — ABNORMAL HIGH (ref 27.2–33.4)
MCHC: 34.6 g/dL (ref 32.0–36.0)
MCV: 104.3 fL — AB (ref 79.3–98.0)
MONO#: 0.5 10*3/uL (ref 0.1–0.9)
MONO%: 16.5 % — ABNORMAL HIGH (ref 0.0–14.0)
NEUT%: 68.1 % (ref 39.0–75.0)
NEUTROS ABS: 1.9 10*3/uL (ref 1.5–6.5)
PLATELETS: 71 10*3/uL — AB (ref 140–400)
RBC: 2.3 10*6/uL — ABNORMAL LOW (ref 4.20–5.82)
RDW: 21.9 % — AB (ref 11.0–14.6)
WBC: 2.8 10*3/uL — AB (ref 4.0–10.3)
lymph#: 0.4 10*3/uL — ABNORMAL LOW (ref 0.9–3.3)

## 2016-01-15 LAB — MAGNESIUM: Magnesium: 2.2 mg/dl (ref 1.5–2.5)

## 2016-01-15 MED ORDER — HEPARIN SOD (PORK) LOCK FLUSH 100 UNIT/ML IV SOLN
500.0000 [IU] | Freq: Once | INTRAVENOUS | Status: AC | PRN
  Administered 2016-01-15: 500 [IU]
  Filled 2016-01-15: qty 5

## 2016-01-15 MED ORDER — DIPHENHYDRAMINE HCL 50 MG/ML IJ SOLN
INTRAMUSCULAR | Status: AC
  Filled 2016-01-15: qty 1

## 2016-01-15 MED ORDER — CETUXIMAB CHEMO IV INJECTION 200 MG/100ML
250.0000 mg/m2 | Freq: Once | INTRAVENOUS | Status: AC
  Administered 2016-01-15: 500 mg via INTRAVENOUS
  Filled 2016-01-15: qty 200

## 2016-01-15 MED ORDER — SODIUM CHLORIDE 0.9 % IJ SOLN
10.0000 mL | INTRAMUSCULAR | Status: DC | PRN
  Administered 2016-01-15: 10 mL
  Filled 2016-01-15: qty 10

## 2016-01-15 MED ORDER — DIPHENHYDRAMINE HCL 50 MG/ML IJ SOLN
50.0000 mg | Freq: Once | INTRAMUSCULAR | Status: AC
  Administered 2016-01-15: 50 mg via INTRAVENOUS

## 2016-01-15 MED ORDER — SODIUM CHLORIDE 0.9% FLUSH
10.0000 mL | INTRAVENOUS | Status: DC | PRN
  Administered 2016-01-15: 10 mL via INTRAVENOUS
  Filled 2016-01-15: qty 10

## 2016-01-15 MED ORDER — SODIUM CHLORIDE 0.9 % IV SOLN
Freq: Once | INTRAVENOUS | Status: AC
  Administered 2016-01-15: 10:00:00 via INTRAVENOUS

## 2016-01-15 NOTE — Patient Instructions (Signed)
[facility] Discharge Instructions for Patients Receiving Chemotherapy  Today you received the following chemotherapy agents Erbitux To help prevent nausea and vomiting after your treatment, we encourage you to take your nausea medication as prescribed.   If you develop nausea and vomiting that is not controlled by your nausea medication, call the clinic.   BELOW ARE SYMPTOMS THAT SHOULD BE REPORTED IMMEDIATELY:  *FEVER GREATER THAN 100.5 F  *CHILLS WITH OR WITHOUT FEVER  NAUSEA AND VOMITING THAT IS NOT CONTROLLED WITH YOUR NAUSEA MEDICATION  *UNUSUAL SHORTNESS OF BREATH  *UNUSUAL BRUISING OR BLEEDING  TENDERNESS IN MOUTH AND THROAT WITH OR WITHOUT PRESENCE OF ULCERS  *URINARY PROBLEMS  *BOWEL PROBLEMS  UNUSUAL RASH Items with * indicate a potential emergency and should be followed up as soon as possible.  Feel free to call the clinic you have any questions or concerns. The clinic phone number is [phone].  Please show the CHEMO ALERT CARD at check-in to the Emergency Department and triage nurse.   

## 2016-01-15 NOTE — Patient Instructions (Signed)

## 2016-01-15 NOTE — Progress Notes (Signed)
Pt tolerated 1 hr of erbitux treatment. Currently on 1hr post observation. VSS stable and denies any complaints or symptoms at this time.

## 2016-01-18 ENCOUNTER — Ambulatory Visit: Payer: Medicaid Other | Attending: Radiation Oncology

## 2016-01-18 ENCOUNTER — Ambulatory Visit: Payer: Medicaid Other

## 2016-01-18 DIAGNOSIS — R1312 Dysphagia, oropharyngeal phase: Secondary | ICD-10-CM | POA: Diagnosis not present

## 2016-01-18 NOTE — Patient Instructions (Signed)
Do your exercises at least twice a day, and try for 3 times a day Use a teaspoon for the effortful swallow - aim for 20 repetitions, with 1/2 teaspoon each, swallow HARD three times Using the swallowing muscles is the best way you can have a chance to make that hard muscle softer again

## 2016-01-18 NOTE — Therapy (Addendum)
[facility] [address] [place], [place], [address] Phone: [phone]   Fax:  [phone]  Speech Language Pathology Treatment  Patient Details  Name: Kyle Alexander MRN: [id number] Date of Birth: 1961/02/03 No Data Recorded  Encounter Date: 01/18/2016      End of Session - 01/18/16 1638    Visit Number 5   Number of Visits 8   Date for SLP Re-Evaluation 01/18/16   Authorization Type pt states he will have Medicare in January 2017   SLP Start Time 1525   SLP Stop Time  1615   SLP Time Calculation (min) 50 min   Activity Tolerance Patient tolerated treatment well      Past Medical History  Diagnosis Date  . Hypertension   . Shortness of breath dyspnea   . GERD (gastroesophageal reflux disease)   . Seizures ([place])   . S/P radiation therapy 04/16/2015-05/18/2015    Laryngopharynx and bilateral neck / 50 Gy in 20 fractions to gross disease, 45 Gy in 20 fractions to high risk nodal echelons   . Heartburn symptom 10/19/2015  . Cancer ([place]) 03/16/15    left cervical lymph node / throat (2016)  . Nausea & vomiting 12/15/2015    Past Surgical History  Procedure Laterality Date  . Knee surgery  1998    Left  . Tonsillectomy and adenoidectomy    . Lymph node biopsy Left 03/16/15    left cervical, consistent with squamous cell carcinoma  . Hernia repair      right groin  . Tonsillectomy    . Tracheostomy tube placement N/A 03/26/2015    Procedure: TRACHEOSTOMY ;  Surgeon: [name], MD;  Location: [facility];  Service: ENT;  Laterality: N/A;  . Tooth extraction  03/26/2015    Procedure: [place];  Surgeon: [name], MD;  Location: [facility];  Service: ENT;;  . Multiple extractions with alveoloplasty N/A 03/26/2015    Procedure: EXTRACTION OF TOOTH #'S 1,2,5,6,7,8,9,10,11,12,13,18,19,21,22,23,24,25,26,27,28,29  WITH AVELOPLASTY;  Surgeon: Lenn Cal, DDS;  Location: [facility];  Service: Oral Surgery;  Laterality: N/A;  .  Gastrostomy N/A 03/31/2015    Procedure: OPEN GASTROSTOMY TUBE PLACEMENT;  Surgeon: Rolm Bookbinder, MD;  Location: [facility];  Service: General;  Laterality: N/A;    There were no vitals filed for this visit.  Visit Diagnosis: Dysphagia, oropharyngeal      Subjective Assessment - 01/18/16 1534    Subjective Pt smells of tobacco smoke this afternoon. Complains of need to expectorate in cup instead of swallow due to saliva too thick to swallow. "95% of my life is hocking and spitting."               ADULT SLP TREATMENT - 01/18/16 1534    General Information   Behavior/Cognition Alert;Cooperative;Pleasant mood;Agitated  agitated regarding pain   Treatment Provided   Treatment provided Dysphagia   Dysphagia Treatment   Temperature Spikes Noted No   Respiratory Status Trach   Oral Cavity - Dentition Edentulous   Treatment Methods Therapeutic exercise   Patient observed directly with PO's Yes   Liquids provided via Cup;Teaspoon   Pharyngeal Phase Signs & Symptoms Immediate cough;Delayed cough;Wet vocal quality;Multiple swallows   Type of cueing Verbal;Tactile;Visual   Amount of cueing Minimal   Other treatment/comments Pt reports no appetite. Has to expectorate saliva due to too thick. Pt has had to water soups down approx once a week and ceases POs due to saliva too thick. Has not done HEP  as he did pre-chemo. Pt took drink of thin with nasal regurgitationk mild, pt stated this was now something that was occurring regularly. SLP encouraged smaller sips finally reduced to pt spooning 1/2 teaspoon into patient's mouth, wihtout notable nasal regurgitation. SLP told pt to do up to 20 of these 1/2 teaspoon swallows x3 effortful swallows with each one to ensure pharyngeal clearance, as audible liquid moving through pharynx with each consecutive swallow with larger sips. Pt was told he must complete oral care with toothbrush and mouthwash - SLP suggeseted toothette for mouthwash instead of  cup sip, swish, and spit to eliminate possibility for premature spillage/entry into trachea. SLP then educated pt re: HEP and he req'd min A rarely. Muscle fibrosis has incr'd in neck, what SLP thinks is mod-significantly firm. SLP again reiterated the need for pt to engage with HEP at least x2/day in order to have chance of decr'ing muscle fibrosis in bil neck.    Pain Assessment   Pain Assessment 0-10   Pain Score 8    Pain Location throat, near trach site   Pain Descriptors / Indicators Sore   Pain Intervention(s) Monitored during session   Assessment / Recommendations / Plan   Plan Continue with current plan of care   Dysphagia Recommendations   Diet recommendations Thin liquid;Nectar-thick liquid  only after oral care   Liquids provided via Teaspoon;Cup   Compensations Slow rate;Small sips/bites;Multiple dry swallows after each bite/sip;Follow solids with liquid;Clear throat intermittently;Effortful swallow   Postural Changes and/or Swallow Maneuvers Seated upright 90 degrees   Progression Toward Goals   Progression toward goals Not progressing toward goals (comment)  pt's status has decr'd likely due to incr'd fibrosis          SLP Education - 01/18/16 1637    Education provided Yes   Education Details HEP, safety with thin liquid trials   Person(s) Educated Patient   Methods Explanation;Demonstration;Verbal cues   Comprehension Verbalized understanding;Verbal cues required          SLP Short Term Goals - 01/18/16 1641    SLP SHORT TERM GOAL #1   Title pt will demo dysphagia HEP with rare min cues, in order to improve swallowing ability/decr aspiration risk   Baseline occasional min cues   Status Achieved   SLP SHORT TERM GOAL #2   Title pt will demo swallowing precautions to decr aspiration risk with POs   Baseline pt req'd total A   Time 4   Period   visits   Status On-going Renewed 01-18-16          SLP Long Term Goals - 01/18/16 1644    SLP LONG TERM GOAL  #1   Title pt to demo HEP for dysphagia to incr ability to swallow POs safely/decr risk of aspiration with independence   Baseline occasional verbal and demo cues   Time 8   Period --  visits   Status On-going  renewed 01-18-16   SLP LONG TERM GOAL #2   Title pt will tell SLP signs/symptoms aspiration PNA with rare verbal cues   Baseline total A   Time 8   Period --  visits   Status On-going  renewed 01-18-16   SLP LONG TERM GOAL #3   Title pt will tell SLP why he is completing HEP    Baseline total A   Time 8   Period --  visits   Status On-going  renewed 01-18-16  Plan - 01/18/16 1639    Clinical Impression Statement Pt requires cont'd skilled ST for treatment for oropharyngeal dysphagia (ICD-10: R13.12) following radiation tx for supraglottic head and neck CA (ICD-10: C32.1) Pt will need to cont to be followed by SLP to assess proper procedure with HEP and to assess safety with PO intake. HEP was completed today with rare min A. Pt's status for swallowing has declined since last visit in December 2016 due to incr'd muscle fibrosis of radiated swallowing musculature.   Speech Therapy Frequency --  approx every four weeks   Duration --  8 more visits   Treatment/Interventions Aspiration precaution training;Pharyngeal strengthening exercises;Oral motor exercises;Patient/family education;Compensatory strategies;SLP instruction and feedback   Potential to Achieve Goals Fair   Potential Considerations Severity of impairments;Medical prognosis   SLP Home Exercise Plan reviewed today   Consulted and Agree with Plan of Care Patient        ADDENDUM: (03-04-16) SPEECH THERAPY DISCHARGE SUMMARY  Visits from Start of Care: 5  Current functional level related to goals / functional outcomes: See above for goal update. Pt would like to reserve future visits with ST at this time and be discharged. If necessary work can be done to obtain additional prescription at a later  date.  Remaining deficits: Dysphagia.   Education / Equipment: HEP, late effects head/neck radiation.  Plan: Patient agrees to discharge.  Patient goals were partially met. Patient is being discharged due to the patient's request.  ?????        Problem List Patient Active Problem List   Diagnosis Date Noted  . Chemotherapy-induced nausea 01/01/2016  . Nausea & vomiting 12/15/2015  . Anemia due to antineoplastic chemotherapy 12/11/2015  . Acneiform rash 12/11/2015  . Nasal drainage 10/30/2015  . Gastrostomy tube in place ([place]) 10/19/2015  . Heartburn symptom 10/19/2015  . Tracheostomy status ([place])   . Hypomagnesemia   . Tracheostomy care ([place])   . 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 Vitamin D deficiency 03/17/2015  . Folate deficiency 03/16/2015  . Protein-calorie malnutrition, severe ([place]) 03/16/2015  . HTN (hypertension) 03/15/2015  . Tobacco abuse 03/15/2015  . Alcohol abuse 03/15/2015  . Alcohol withdrawal seizure ([place]) 03/15/2015  . Prolonged Q-T interval on ECG 03/15/2015  . Aortic dilatation ([place]) 03/15/2015  . Thyroid nodule 03/15/2015  . Lymphadenopathy 03/15/2015  . Hypokalemia 03/15/2015  . B12 deficiency 03/15/2015  . Marijuana abuse 03/15/2015    [facility] ,[place], [place]  01/18/2016, 4:46 PM  [facility] [address] [place], [place], [address] Phone: [phone]   Fax:  [phone]   Name: Kyle Alexander MRN: [id number] Date of Birth: [birth year]

## 2016-01-22 ENCOUNTER — Telehealth: Payer: Self-pay | Admitting: Hematology and Oncology

## 2016-01-22 ENCOUNTER — Other Ambulatory Visit: Payer: Self-pay | Admitting: *Deleted

## 2016-01-22 ENCOUNTER — Encounter: Payer: Self-pay | Admitting: Hematology and Oncology

## 2016-01-22 ENCOUNTER — Ambulatory Visit (HOSPITAL_BASED_OUTPATIENT_CLINIC_OR_DEPARTMENT_OTHER): Payer: Medicaid Other | Admitting: Hematology and Oncology

## 2016-01-22 ENCOUNTER — Ambulatory Visit: Payer: Medicaid Other

## 2016-01-22 ENCOUNTER — Other Ambulatory Visit (HOSPITAL_BASED_OUTPATIENT_CLINIC_OR_DEPARTMENT_OTHER): Payer: Medicaid Other

## 2016-01-22 VITALS — BP 122/59 | HR 78 | Temp 98.2°F | Resp 17 | Ht 70.0 in | Wt 156.1 lb

## 2016-01-22 DIAGNOSIS — Z720 Tobacco use: Secondary | ICD-10-CM

## 2016-01-22 DIAGNOSIS — C321 Malignant neoplasm of supraglottis: Secondary | ICD-10-CM

## 2016-01-22 DIAGNOSIS — Z930 Tracheostomy status: Secondary | ICD-10-CM

## 2016-01-22 DIAGNOSIS — K209 Esophagitis, unspecified without bleeding: Secondary | ICD-10-CM | POA: Insufficient documentation

## 2016-01-22 DIAGNOSIS — G4700 Insomnia, unspecified: Secondary | ICD-10-CM

## 2016-01-22 DIAGNOSIS — Z931 Gastrostomy status: Secondary | ICD-10-CM | POA: Diagnosis not present

## 2016-01-22 DIAGNOSIS — E038 Other specified hypothyroidism: Secondary | ICD-10-CM

## 2016-01-22 DIAGNOSIS — Z430 Encounter for attention to tracheostomy: Secondary | ICD-10-CM

## 2016-01-22 DIAGNOSIS — D61810 Antineoplastic chemotherapy induced pancytopenia: Secondary | ICD-10-CM | POA: Diagnosis not present

## 2016-01-22 DIAGNOSIS — R042 Hemoptysis: Secondary | ICD-10-CM | POA: Diagnosis not present

## 2016-01-22 DIAGNOSIS — T451X5A Adverse effect of antineoplastic and immunosuppressive drugs, initial encounter: Secondary | ICD-10-CM

## 2016-01-22 HISTORY — DX: Insomnia, unspecified: G47.00

## 2016-01-22 LAB — COMPREHENSIVE METABOLIC PANEL
ALBUMIN: 3.7 g/dL (ref 3.5–5.0)
ALK PHOS: 65 U/L (ref 40–150)
ALT: 10 U/L (ref 0–55)
AST: 14 U/L (ref 5–34)
Anion Gap: 9 mEq/L (ref 3–11)
BUN: 11.2 mg/dL (ref 7.0–26.0)
CHLORIDE: 101 meq/L (ref 98–109)
CO2: 29 meq/L (ref 22–29)
Calcium: 9.7 mg/dL (ref 8.4–10.4)
Creatinine: 0.8 mg/dL (ref 0.7–1.3)
GLUCOSE: 114 mg/dL (ref 70–140)
POTASSIUM: 4 meq/L (ref 3.5–5.1)
SODIUM: 139 meq/L (ref 136–145)
Total Bilirubin: 0.41 mg/dL (ref 0.20–1.20)
Total Protein: 7.2 g/dL (ref 6.4–8.3)

## 2016-01-22 LAB — CBC WITH DIFFERENTIAL/PLATELET
BASO%: 0.3 % (ref 0.0–2.0)
BASOS ABS: 0 10*3/uL (ref 0.0–0.1)
EOS ABS: 0 10*3/uL (ref 0.0–0.5)
EOS%: 1.2 % (ref 0.0–7.0)
HCT: 26.9 % — ABNORMAL LOW (ref 38.4–49.9)
HEMOGLOBIN: 9.2 g/dL — AB (ref 13.0–17.1)
LYMPH%: 15.3 % (ref 14.0–49.0)
MCH: 37.1 pg — AB (ref 27.2–33.4)
MCHC: 34.2 g/dL (ref 32.0–36.0)
MCV: 108.5 fL — AB (ref 79.3–98.0)
MONO#: 0.6 10*3/uL (ref 0.1–0.9)
MONO%: 16.7 % — AB (ref 0.0–14.0)
NEUT#: 2.3 10*3/uL (ref 1.5–6.5)
NEUT%: 66.5 % (ref 39.0–75.0)
Platelets: 59 10*3/uL — ABNORMAL LOW (ref 140–400)
RBC: 2.48 10*6/uL — AB (ref 4.20–5.82)
RDW: 23.4 % — AB (ref 11.0–14.6)
WBC: 3.5 10*3/uL — ABNORMAL LOW (ref 4.0–10.3)
lymph#: 0.5 10*3/uL — ABNORMAL LOW (ref 0.9–3.3)

## 2016-01-22 MED ORDER — LANSOPRAZOLE 30 MG PO CPDR: 30.0000 mg | DELAYED_RELEASE_CAPSULE | Freq: Two times a day (BID) | ORAL | Status: DC

## 2016-01-22 MED ORDER — SODIUM CHLORIDE 0.9 % EX SOLN: CUTANEOUS | Status: DC

## 2016-01-22 MED ORDER — ZOLPIDEM TARTRATE 5 MG PO TABS: 5.0000 mg | ORAL_TABLET | Freq: Every evening | ORAL | Status: DC | PRN

## 2016-01-22 MED ORDER — LEVOTHYROXINE SODIUM 25 MCG PO TABS: 25.0000 ug | ORAL_TABLET | Freq: Every day | ORAL | Status: DC

## 2016-01-22 MED ORDER — SODIUM CHLORIDE 0.9 % IR SOLN: 1000.0000 mL | Freq: Every day | Status: DC

## 2016-01-22 MED ORDER — OXYCODONE HCL 5 MG/5ML PO SOLN: 5.0000 mg | Freq: Four times a day (QID) | ORAL | Status: DC | PRN

## 2016-01-22 MED FILL — LEVOTHYROXINE 25 MCG TABLET: 25 | 30 days supply | Qty: 30 | Fill #0

## 2016-01-22 MED FILL — SODIUM CHLORIDE 0.9% IRRIG.: 0.9 | 30 days supply | Qty: 1000 | Fill #0

## 2016-01-22 MED FILL — ZOLPIDEM TARTRATE 5 MG TAB: 5 | 30 days supply | Qty: 15 | Fill #0

## 2016-01-22 NOTE — Assessment & Plan Note (Signed)
His currently using a nicotine patch. I continue to encourage him to quit smoking.

## 2016-01-22 NOTE — Progress Notes (Signed)
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[place])   03/15/2015 - 03/17/2015 Hospital Admission He was admitted to the hospital for evaluation of dysphagia, SOB, hemoptosis, hoarseness, 30-40 pound weight loss and worsening bilateral neck masses for 5 months   03/15/2015 Imaging Ct showed extensive circumferential malignancy in the hypopharyngeal/supraglottic region with regional LN metastases   03/16/2015 Procedure He underwent ULTRASOUND-GUIDED BIOPSY OF LEFT CERVICAL LYMPH NODES   03/16/2015 Pathology Results Accession: [id number] LN biopsy showed invasive squamous cell cancer   03/16/2015 Pathology Results Accession: [id number] showed atypical squamous cells   03/25/2015 - 04/07/2015 Hospital Admission He was admitted to the hospital and underwent tracheostomy placement, feeeding tube placement but subsequently left [facility]   03/26/2015 Surgery He had multiple extraction of tooth numbers 1, 2, 5, 6, 7, 8, 9, 10, 11, 12, 13, 18, 19, 21, 22, 23, 24, 25, 26, 27, 28, and 29. and 4 Quadrants of alveoloplasty   03/26/2015 Surgery He underwent tracheostomy   03/31/2015 Surgery He had open gastrostomy tube placement by Dr. Donne Hazel   04/16/2015 - 05/18/2015 Radiation Therapy Laryngopharynx and bilateral neck / 50 Gy in 20 fractions to gross disease, 45 Gy in 20 fractions to high risk nodal echelons  Beams/energy: Helical IMRT / 6 MV photons   04/16/2015 Procedure Fluoroscopic reposition of the 18 French gastrostomy confirmed back in the stomach,   07/03/2015 Procedure IR performed replacement of gastrostomy tube with a new 60 French balloon retention tube   10/01/2015 Imaging PEt scan showed persistent hypermetabolism within the primary  supraglottic laryngeal tumor and within right retropharyngeal, bilateral level II and left level IV cervical nodal metastases   10/28/2015 Procedure He had placement of PICC line. The IR was not able to place PORT due to suspected upper respiratory infection   11/13/2015 -  Chemotherapy He received 5FU, carboplatin chemo with weekly Erbitux   11/19/2015 Surgery Gastrostomy tube replaced.   11/30/2015 Procedure Placement of right jugular port-a-cath.   11/30/2015 Procedure PICC removed.   12/30/2015 Imaging PET CT showed positive response to Rx    INTERVAL HISTORY: Please see below for problem oriented charting. He is seen prior to cycle 4 of chemotherapy. The patient has significant nausea and vomiting after last cycle of treatment. He went to the emergency department and was noted to have hemoptysis. CT scan of the chest, abdomen and pelvis show severe esophagitis. His symptoms was subsequently relieved with IV hydration. He has occasional pain with swallowing. He rated his pain as 5 out of 10, relief with oxycodone He continues a mild recurrent hemoptysis when he tries to clear his throat. The groin infection has stopped draining. He continues to smoke. He denies further nausea or vomiting over the past week  REVIEW OF SYSTEMS:   Constitutional: Denies fevers, chills or abnormal weight loss Eyes: Denies blurriness of vision Respiratory: Denies cough, dyspnea or wheezes Cardiovascular: Denies palpitation, chest discomfort or lower extremity swelling Skin: Denies abnormal skin rashes Lymphatics: Denies new lymphadenopathy or easy bruising Neurological:Denies numbness, tingling or new weaknesses Behavioral/Psych: Mood is stable, no new changes  All other systems were reviewed with the patient and are negative.  I have reviewed the past medical history, past surgical history, social history and family history  with the patient and they are unchanged from previous note.  ALLERGIES:  is  allergic to aspirin.  MEDICATIONS:  Current Outpatient Prescriptions  Medication Sig Dispense Refill  . emollient (BIAFINE) cream Apply 1 application topically 2 (two) times daily. Reported on 12/02/2015    . fluticasone (FLONASE) 50 MCG/ACT nasal spray Place 2 sprays into both nostrils daily. 16 g 2  . ibuprofen (ADVIL,MOTRIN) 200 MG tablet Take 200 mg by mouth every 6 (six) hours as needed for mild pain. Reported on 12/11/2015    . lansoprazole (PREVACID) 30 MG capsule Take 1 capsule (30 mg total) by mouth 2 (two) times daily before a meal. 60 capsule 6  . levothyroxine (LEVOTHROID) 25 MCG tablet Take 1 tablet (25 mcg total) by mouth daily before breakfast. 30 tablet 3  . lidocaine (XYLOCAINE) 2 % solution Use as directed 15 mLs in the mouth or throat every 8 (eight) hours as needed for mouth pain. 100 mL 0  . lidocaine-prilocaine (EMLA) cream Apply 1 application topically as needed. 30 g 0  . LORazepam (ATIVAN) 0.5 MG tablet Take 1-2 tablets before PET/ CT Scans. 6 tablet 0  . metoCLOPramide (REGLAN) 5 MG/5ML solution Place 5 mLs (5 mg total) into feeding tube 4 (four) times daily -  before meals and at bedtime. 240 mL 0  . nicotine (NICODERM CQ) 7 mg/24hr patch apply 7 mg patch daily x 4 wks after quitting smoking 14 patch 6  . Nutritional Supplements (FEEDING SUPPLEMENT, OSMOLITE 1.2 CAL,) LIQD Give 2 cans osmolite 1.2 QID with 60 cc free water before and after bolus feeding.  Give an additional 240 cc free water via PEG daily. 1896 mL 0  . omeprazole (PRILOSEC) 20 MG capsule Take 1 capsule (20 mg total) by mouth 2 (two) times daily. 60 capsule 0  . ondansetron (ZOFRAN) 8 MG tablet Take 1 tablet (8 mg total) by mouth every 8 (eight) hours as needed for nausea. 60 tablet 3  . oxyCODONE (ROXICODONE) 5 MG/5ML solution Take 5 mLs (5 mg total) by mouth every 6 (six) hours as needed. 473 mL 0  . oxymetazoline (AFRIN) 0.05 % nasal spray Place 1 spray into both nostrils as needed for congestion.     . polyethylene glycol (MIRALAX) packet Take 17 g by mouth daily. (Patient not taking: Reported on 12/02/2015) 14 each 0  . Sodium Chloride Flush (NORMAL SALINE FLUSH) 0.9 % SOLN Inject 10 mLs into the vein daily. 30 Syringe 0  . sodium chloride irrigation 0.9 % irrigation Irrigate with 1,000 mLs as directed daily. Irrigate Feeding tube as directed daily 1000 mL 11  . zolpidem (AMBIEN) 5 MG tablet Take 1 tablet (5 mg total) by mouth at bedtime as needed for sleep. 30 tablet 0   No current facility-administered medications for this visit.    PHYSICAL EXAMINATION: ECOG PERFORMANCE STATUS: 1 - Symptomatic but completely ambulatory  Filed Vitals:   01/22/16 0946  BP: 122/59  Pulse: 78  Temp: 98.2 F (36.8 C)  Resp: 17   Filed Weights   01/22/16 0946  Weight: 156 lb 1.6 oz (70.806 kg)    GENERAL:alert, no distress and comfortable SKIN: skin color, texture, turgor are normal, no rashes or significant lesions. Mild skin lesion on his face, unchanged EYES: normal, Conjunctiva are pink and non-injected, sclera clear OROPHARYNX:no exudate, no erythema and lips, buccal mucosa, and tongue normal  NECK: noted tracheostomy site looks okay LYMPH:  no palpable lymphadenopathy in the cervical, axillary or inguinal LUNGS: clear  to auscultation and percussion with normal breathing effort HEART: regular rate & rhythm and no murmurs and no lower extremity edema ABDOMEN:abdomen soft, non-tender and normal bowel sounds. Feeding tube site ok Musculoskeletal:no cyanosis of digits and no clubbing  NEURO: alert & oriented x 3 with fluent speech, no focal motor/sensory deficits  LABORATORY DATA:  I have reviewed the data as listed    Component Value Date/Time   NA 139 01/22/2016 0929   NA 140 01/02/2016 1433   K 4.0 01/22/2016 0929   K 4.3 01/02/2016 1433   CL 99* 01/02/2016 1433   CO2 29 01/22/2016 0929   CO2 29 01/02/2016 1422   GLUCOSE 114 01/22/2016 0929   GLUCOSE 117* 01/02/2016 1433   BUN  11.2 01/22/2016 0929   BUN 17 01/02/2016 1433   CREATININE 0.8 01/22/2016 0929   CREATININE 0.80 01/02/2016 1433   CALCIUM 9.7 01/22/2016 0929   CALCIUM 9.8 01/02/2016 1422   PROT 7.2 01/22/2016 0929   PROT 7.4 01/02/2016 1422   ALBUMIN 3.7 01/22/2016 0929   ALBUMIN 3.9 01/02/2016 1422   AST 14 01/22/2016 0929   AST 28 01/02/2016 1422   ALT 10 01/22/2016 0929   ALT 15* 01/02/2016 1422   ALKPHOS 65 01/22/2016 0929   ALKPHOS 56 01/02/2016 1422   BILITOT 0.41 01/22/2016 0929   BILITOT 0.8 01/02/2016 1422   GFRNONAA >60 01/02/2016 1422   GFRAA >60 01/02/2016 1422    No results found for: SPEP, UPEP  Lab Results  Component Value Date   WBC 3.5* 01/22/2016   NEUTROABS 2.3 01/22/2016   HGB 9.2* 01/22/2016   HCT 26.9* 01/22/2016   MCV 108.5* 01/22/2016   PLT 59* 01/22/2016      Chemistry      Component Value Date/Time   NA 139 01/22/2016 0929   NA 140 01/02/2016 1433   K 4.0 01/22/2016 0929   K 4.3 01/02/2016 1433   CL 99* 01/02/2016 1433   CO2 29 01/22/2016 0929   CO2 29 01/02/2016 1422   BUN 11.2 01/22/2016 0929   BUN 17 01/02/2016 1433   CREATININE 0.8 01/22/2016 0929   CREATININE 0.80 01/02/2016 1433      Component Value Date/Time   CALCIUM 9.7 01/22/2016 0929   CALCIUM 9.8 01/02/2016 1422   ALKPHOS 65 01/22/2016 0929   ALKPHOS 56 01/02/2016 1422   AST 14 01/22/2016 0929   AST 28 01/02/2016 1422   ALT 10 01/22/2016 0929   ALT 15* 01/02/2016 1422   BILITOT 0.41 01/22/2016 0929   BILITOT 0.8 01/02/2016 1422     ASSESSMENT & PLAN:  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[facility]) He tolerated treatment poorly with significant nausea, vomiting and pancytopenia despite mild dose adjustment. He went to the emergency room recently and had CT scan done which showed diffuse esophagitis which I suspect is related to side effects of treatment. With persistent pancytopenia, I will hold his treatment today and reschedule next week. Starting next week, cycle 4, I will  dose reduce his chemotherapy by 50% off the original dose to see if he would tolerate the treatment better. On the week when he returns for pump disconnect, I will also schedule IV fluids and IV antiemetics for him  Pancytopenia due to antineoplastic chemotherapy [facility]) This is likely due to recent treatment. He had recent hemoptysis and recent groin infection requiring antibiotics. I will hold treatment today.his chemotherapy will be rescheduled for next week As above, I plan to reduce dose of treatment in the  future  Gastrostomy tube in place [facility]) The feeding tube looks fine It was replaced recently and appears to be functioning well.    Trache[facility] care [facility]) I recommend close follow-up with ENT. I refill his prescription for saline irrigation    Esophagitis, unspecified He has severe esophagitis likely related to recent chemotherapy. As above, I will hold treatment. I recommend adding proton pump inhibitor to help with the esophagitis and nausea  Hemoptysis He has mild, recurrent which suspect is related to esophagitis. I will hold treatment and recommend proton pump inhibitor.  Tobacco abuse His currently using a nicotine patch. I continue to encourage him to quit smoking.     No orders of the defined types were placed in this encounter.   All questions were answered. The patient knows to call the clinic with any problems, questions or concerns. No barriers to learning was detected. I spent 30 minutes counseling the patient face to face. The total time spent in the appointment was 40 minutes and more than 50% was on counseling and review of test results     [facility], Dejanee Thibeaux, MD 01/22/2016 10:32 AM

## 2016-01-22 NOTE — Assessment & Plan Note (Signed)
This is likely due to recent treatment. He had recent hemoptysis and recent groin infection requiring antibiotics. I will hold treatment today.his chemotherapy will be rescheduled for next week As above, I plan to reduce dose of treatment in the future

## 2016-01-22 NOTE — Assessment & Plan Note (Signed)
I recommend close follow-up with ENT. I refill his prescription for saline irrigation

## 2016-01-22 NOTE — Assessment & Plan Note (Signed)
He has severe esophagitis likely related to recent chemotherapy. As above, I will hold treatment. I recommend adding proton pump inhibitor to help with the esophagitis and nausea

## 2016-01-22 NOTE — Assessment & Plan Note (Signed)
He tolerated treatment poorly with significant nausea, vomiting and pancytopenia despite mild dose adjustment. He went to the emergency room recently and had CT scan done which showed diffuse esophagitis which I suspect is related to side effects of treatment. With persistent pancytopenia, I will hold his treatment today and reschedule next week. Starting next week, cycle 4, I will dose reduce his chemotherapy by 50% off the original dose to see if he would tolerate the treatment better. On the week when he returns for pump disconnect, I will also schedule IV fluids and IV antiemetics for him

## 2016-01-22 NOTE — Telephone Encounter (Signed)
Appointments made and avs printed °

## 2016-01-22 NOTE — Assessment & Plan Note (Signed)
The feeding tube looks fine It was replaced recently and appears to be functioning well.

## 2016-01-22 NOTE — Assessment & Plan Note (Signed)
He has mild, recurrent which suspect is related to esophagitis. I will hold treatment and recommend proton pump inhibitor.

## 2016-01-25 ENCOUNTER — Ambulatory Visit: Payer: Self-pay

## 2016-01-29 ENCOUNTER — Ambulatory Visit: Payer: Medicaid Other

## 2016-01-29 ENCOUNTER — Ambulatory Visit (HOSPITAL_BASED_OUTPATIENT_CLINIC_OR_DEPARTMENT_OTHER): Payer: Medicaid Other

## 2016-01-29 ENCOUNTER — Other Ambulatory Visit (HOSPITAL_BASED_OUTPATIENT_CLINIC_OR_DEPARTMENT_OTHER): Payer: Medicaid Other

## 2016-01-29 ENCOUNTER — Ambulatory Visit (HOSPITAL_BASED_OUTPATIENT_CLINIC_OR_DEPARTMENT_OTHER): Payer: Medicaid Other | Admitting: Nurse Practitioner

## 2016-01-29 VITALS — BP 125/71 | HR 90 | Temp 98.3°F

## 2016-01-29 DIAGNOSIS — L0201 Cutaneous abscess of face: Secondary | ICD-10-CM | POA: Diagnosis present

## 2016-01-29 DIAGNOSIS — Z5112 Encounter for antineoplastic immunotherapy: Secondary | ICD-10-CM

## 2016-01-29 DIAGNOSIS — L0292 Furuncle, unspecified: Secondary | ICD-10-CM

## 2016-01-29 DIAGNOSIS — C321 Malignant neoplasm of supraglottis: Secondary | ICD-10-CM

## 2016-01-29 DIAGNOSIS — Z5111 Encounter for antineoplastic chemotherapy: Secondary | ICD-10-CM

## 2016-01-29 LAB — CBC WITH DIFFERENTIAL/PLATELET
BASO%: 0 % (ref 0.0–2.0)
Basophils Absolute: 0 10*3/uL (ref 0.0–0.1)
EOS%: 0.8 % (ref 0.0–7.0)
Eosinophils Absolute: 0.1 10*3/uL (ref 0.0–0.5)
HCT: 26.9 % — ABNORMAL LOW (ref 38.4–49.9)
HGB: 9.1 g/dL — ABNORMAL LOW (ref 13.0–17.1)
LYMPH%: 11.5 % — AB (ref 14.0–49.0)
MCH: 37.6 pg — ABNORMAL HIGH (ref 27.2–33.4)
MCHC: 33.8 g/dL (ref 32.0–36.0)
MCV: 111.2 fL — ABNORMAL HIGH (ref 79.3–98.0)
MONO#: 0.3 10*3/uL (ref 0.1–0.9)
MONO%: 5 % (ref 0.0–14.0)
NEUT%: 82.7 % — ABNORMAL HIGH (ref 39.0–75.0)
NEUTROS ABS: 5.4 10*3/uL (ref 1.5–6.5)
PLATELETS: 84 10*3/uL — AB (ref 140–400)
RBC: 2.42 10*6/uL — AB (ref 4.20–5.82)
WBC: 6.5 10*3/uL (ref 4.0–10.3)
lymph#: 0.7 10*3/uL — ABNORMAL LOW (ref 0.9–3.3)

## 2016-01-29 LAB — COMPREHENSIVE METABOLIC PANEL
ALT: 10 U/L (ref 0–55)
AST: 13 U/L (ref 5–34)
Albumin: 3.7 g/dL (ref 3.5–5.0)
Alkaline Phosphatase: 55 U/L (ref 40–150)
Anion Gap: 7 mEq/L (ref 3–11)
BUN: 12.7 mg/dL (ref 7.0–26.0)
CHLORIDE: 104 meq/L (ref 98–109)
CO2: 29 meq/L (ref 22–29)
CREATININE: 0.8 mg/dL (ref 0.7–1.3)
Calcium: 9.4 mg/dL (ref 8.4–10.4)
EGFR: >90 mL/min/{1.73_m2} (ref >90)
GLUCOSE: 106 mg/dL (ref 70–140)
Potassium: 3.6 mEq/L (ref 3.5–5.1)
SODIUM: 140 meq/L (ref 136–145)
Total Bilirubin: 0.4 mg/dL (ref 0.20–1.20)
Total Protein: 7.2 g/dL (ref 6.4–8.3)

## 2016-01-29 LAB — MAGNESIUM: MAGNESIUM: 2.2 mg/dL (ref 1.5–2.5)

## 2016-01-29 MED ORDER — SODIUM CHLORIDE 0.9 % IV SOLN
Freq: Once | INTRAVENOUS | Status: AC
  Administered 2016-01-29: 11:00:00 via INTRAVENOUS

## 2016-01-29 MED ORDER — SODIUM CHLORIDE 0.9 % IV SOLN
10.0000 mg | Freq: Once | INTRAVENOUS | Status: AC
  Administered 2016-01-29: 10 mg via INTRAVENOUS
  Filled 2016-01-29: qty 1

## 2016-01-29 MED ORDER — SODIUM CHLORIDE 0.9 % IV SOLN
326.7500 mg | Freq: Once | INTRAVENOUS | Status: AC
  Administered 2016-01-29: 330 mg via INTRAVENOUS
  Filled 2016-01-29: qty 33

## 2016-01-29 MED ORDER — DIPHENHYDRAMINE HCL 50 MG/ML IJ SOLN
50.0000 mg | Freq: Once | INTRAMUSCULAR | Status: AC
  Administered 2016-01-29: 50 mg via INTRAVENOUS

## 2016-01-29 MED ORDER — SODIUM CHLORIDE 0.9 % IJ SOLN
10.0000 mL | Freq: Once | INTRAMUSCULAR | Status: AC
  Administered 2016-01-29: 10 mL via INTRAVENOUS
  Filled 2016-01-29: qty 10

## 2016-01-29 MED ORDER — SULFAMETHOXAZOLE-TRIMETHOPRIM 800-160 MG PO TABS: 1.0000 | ORAL_TABLET | Freq: Two times a day (BID) | ORAL | Status: DC

## 2016-01-29 MED ORDER — PALONOSETRON HCL INJECTION 0.25 MG/5ML
0.2500 mg | Freq: Once | INTRAVENOUS | Status: AC
  Administered 2016-01-29: 0.25 mg via INTRAVENOUS

## 2016-01-29 MED ORDER — CETUXIMAB CHEMO IV INJECTION 200 MG/100ML
250.0000 mg/m2 | Freq: Once | INTRAVENOUS | Status: AC
  Administered 2016-01-29: 500 mg via INTRAVENOUS
  Filled 2016-01-29: qty 200

## 2016-01-29 MED ORDER — FLUOROURACIL CHEMO INJECTION 5 GM/100ML
500.0000 mg/m2/d | INTRAVENOUS | Status: DC
  Administered 2016-01-29: 3700 mg via INTRAVENOUS
  Filled 2016-01-29: qty 74

## 2016-01-29 MED ORDER — DIPHENHYDRAMINE HCL 50 MG/ML IJ SOLN
INTRAMUSCULAR | Status: AC
  Filled 2016-01-29: qty 1

## 2016-01-29 MED ORDER — PALONOSETRON HCL INJECTION 0.25 MG/5ML
INTRAVENOUS | Status: AC
  Filled 2016-01-29: qty 5

## 2016-01-29 MED FILL — oxyCODONE HCL 5 MG/5ML SOLN: 5 | 23 days supply | Qty: 473 | Fill #0

## 2016-01-29 MED FILL — SULFAMETHOXAZOLE/TMP DS TAB: 800-160 | 10 days supply | Qty: 20 | Fill #0

## 2016-01-29 NOTE — Patient Instructions (Signed)

## 2016-01-29 NOTE — Patient Instructions (Signed)
[facility] Discharge Instructions for Patients Receiving Chemotherapy  Today you received the following chemotherapy agents Erbitux/Carboplatin/5FU.  To help prevent nausea and vomiting after your treatment, we encourage you to take your nausea medication as prescribed.   If you develop nausea and vomiting that is not controlled by your nausea medication, call the clinic.   BELOW ARE SYMPTOMS THAT SHOULD BE REPORTED IMMEDIATELY:  *FEVER GREATER THAN 100.5 F  *CHILLS WITH OR WITHOUT FEVER  NAUSEA AND VOMITING THAT IS NOT CONTROLLED WITH YOUR NAUSEA MEDICATION  *UNUSUAL SHORTNESS OF BREATH  *UNUSUAL BRUISING OR BLEEDING  TENDERNESS IN MOUTH AND THROAT WITH OR WITHOUT PRESENCE OF ULCERS  *URINARY PROBLEMS  *BOWEL PROBLEMS  UNUSUAL RASH Items with * indicate a potential emergency and should be followed up as soon as possible.  Feel free to call the clinic you have any questions or concerns. The clinic phone number is [phone].  Please show the [facility] at check-in to the Emergency Department and triage nurse.

## 2016-01-29 NOTE — Progress Notes (Signed)
Okay to treat pt with platelets of 84 today per Dr. Alvy Bimler.

## 2016-02-01 ENCOUNTER — Encounter: Payer: Self-pay | Admitting: Nurse Practitioner

## 2016-02-01 DIAGNOSIS — L0201 Cutaneous abscess of face: Secondary | ICD-10-CM | POA: Insufficient documentation

## 2016-02-01 LAB — WOUND CULTURE: ORGANISM ID, BACTERIA: NONE SEEN

## 2016-02-01 NOTE — Assessment & Plan Note (Signed)
Patient presented to the [facility] today to receive cycle 4 of his carboplatin/5 FU/Erbitux chemotherapy regimen.  Patient is scheduled to return on 02/02/2016 to receive IV fluid rehydration.

## 2016-02-01 NOTE — Assessment & Plan Note (Addendum)
Patient has a large, ruptured, draining abscess to his right cheek.  Patient states that he has chronic abscesses to his bilateral cheek areas in the past.  Exam today reveals an approximately 1.5 cm in diameter abscess that appears to our E be opened; with purulent plug noted.  There is mild edema and tenderness to the site.  There is no warmth and no red streaks to the site.  It does appear that there is some loculations noted with palpation.  Was able to remove the purulent plug from the abscess with minimal manipulation.  Removal of the purulent plug created an open wound; which was then flushed well with sterile saline.  Following the flushing-patient's abscess was packed with iodoform.  Bandage was placed over abscess area.  Also, was able to obtain a wound culture of the site.  Pending results.  Patient was advised to attempt keeping the iodoform intact for as long as possible.  Advised patient that the iodoform packing would eventually fall out; that this was okay.  Patient was advised to call/return if site does not slowly resolve; or he develops any worsening symptoms whatsoever.

## 2016-02-01 NOTE — Progress Notes (Signed)
SYMPTOM MANAGEMENT CLINIC   HPI: Kyle Alexander 56 y.o. 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  Currently undergoing carboplatin/5 FU/Erbitux chemotherapy regimen.   Patient has a large, ruptured, draining abscess to his right cheek.  Patient states that he has chronic abscesses to his bilateral cheek areas in the past.  Exam today reveals an approximately 1.5 cm in diameter abscess that appears to our E be opened; with purulent plug noted.  There is mild edema and tenderness to the site.  There is no warmth and no red streaks to the site.  It does appear that there is some loculations noted with palpation.  Was able to remove the purulent plug from the abscess with minimal manipulation.  Removal of the purulent plug created an open wound; which was then flushed well with sterile saline.  Following the flushing-patient's abscess was packed with iodoform.  Bandage was placed over abscess area.  Patient was advised to attempt keeping the iodoform intact for as long as possible.  Advised patient that the iodoform packing would eventually fall out; that this was okay.  Patient was advised to call/return if site does not slowly resolve; or he develops any worsening symptoms whatsoever.  HPI  ROS  Past Medical History  Diagnosis Date  . Hypertension   . Shortness of breath dyspnea   . GERD (gastroesophageal reflux disease)   . Seizures ([place])   . S/P radiation therapy 04/16/2015-05/18/2015    Laryngopharynx and bilateral neck / 50 Gy in 20 fractions to gross disease, 45 Gy in 20 fractions to high risk nodal echelons   . Heartburn symptom 10/19/2015  . Cancer ([place]) 03/16/15    left cervical lymph node / throat (2016)  . Nausea & vomiting 12/15/2015  . Insomnia 01/22/2016    Past Surgical History  Procedure Laterality Date  . Knee surgery  1998    Left  . Tonsillectomy and adenoidectomy    . Lymph node biopsy Left 03/16/15    left cervical, consistent with squamous cell  carcinoma  . Hernia repair      right groin  . Tonsillectomy    . Tracheostomy tube placement N/A 03/26/2015    Procedure: TRACHEOSTOMY ;  Surgeon: [name], MD;  Location: [facility];  Service: ENT;  Laterality: N/A;  . Tooth extraction  03/26/2015    Procedure: [place];  Surgeon: [name], MD;  Location: [facility];  Service: ENT;;  . Multiple extractions with alveoloplasty N/A 03/26/2015    Procedure: EXTRACTION OF TOOTH #'S 1,2,5,6,7,8,9,10,11,12,13,18,19,21,22,23,24,25,26,27,28,29  WITH AVELOPLASTY;  Surgeon: Lenn Cal, DDS;  Location: [facility];  Service: Oral Surgery;  Laterality: N/A;  . Gastrostomy N/A 03/31/2015    Procedure: OPEN GASTROSTOMY TUBE PLACEMENT;  Surgeon: Rolm Bookbinder, MD;  Location: [facility];  Service: General;  Laterality: N/A;    has HTN (hypertension); Tobacco abuse; Alcohol abuse; Alcohol withdrawal seizure ([place]); Prolonged Q-T interval on ECG; Aortic dilatation ([place]); Thyroid nodule; Lymphadenopathy; Hypokalemia; B12 deficiency; Marijuana abuse; Folate deficiency; Protein-calorie malnutrition, severe ([place]); Vitamin D deficiency; 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[place]); Hypomagnesemia; Tracheostomy care [facility]); Tracheostomy status ([place]); Gastrostomy tube in place [facility]); Heartburn symptom; Nasal drainage; Anemia due to antineoplastic chemotherapy; Acneiform rash; Nausea & vomiting; Chemotherapy-induced nausea; Insomnia; Pancytopenia due to antineoplastic chemotherapy ([place]); Esophagitis, unspecified; Hemoptysis; and Abscess of face on his problem list.    is allergic to aspirin.    Medication List       This list is accurate as of: 01/29/16 11:59 PM.  Always use your most recent med list.               emollient cream  Commonly known as:  BIAFINE  Apply 1 application topically 2 (two) times daily. Reported on 12/02/2015     feeding supplement (OSMOLITE 1.2 CAL) Liqd  Give 2 cans osmolite 1.2 QID with 60 cc free water before and after bolus feeding.  Give  an additional 240 cc free water via PEG daily.     fluticasone 50 MCG/ACT nasal spray  Commonly known as:  FLONASE  Place 2 sprays into both nostrils daily.     ibuprofen 200 MG tablet  Commonly known as:  ADVIL,MOTRIN  Take 200 mg by mouth every 6 (six) hours as needed for mild pain. Reported on 12/11/2015     lansoprazole 30 MG capsule  Commonly known as:  PREVACID  Take 1 capsule (30 mg total) by mouth 2 (two) times daily before a meal.     levothyroxine 25 MCG tablet  Commonly known as:  LEVOTHROID  Take 1 tablet (25 mcg total) by mouth daily before breakfast.     lidocaine 2 % solution  Commonly known as:  XYLOCAINE  Use as directed 15 mLs in the mouth or throat every 8 (eight) hours as needed for mouth pain.     lidocaine-prilocaine cream  Commonly known as:  EMLA  Apply 1 application topically as needed.     LORazepam 0.5 MG tablet  Commonly known as:  ATIVAN  Take 1-2 tablets before PET/ CT Scans.     metoCLOPramide 5 MG/5ML solution  Commonly known as:  REGLAN  Place 5 mLs (5 mg total) into feeding tube 4 (four) times daily -  before meals and at bedtime.     nicotine 7 mg/24hr patch  Commonly known as:  NICODERM CQ  apply 7 mg patch daily x 4 wks after quitting smoking     omeprazole 20 MG capsule  Commonly known as:  PRILOSEC  Take 1 capsule (20 mg total) by mouth 2 (two) times daily.     ondansetron 8 MG tablet  Commonly known as:  ZOFRAN  Take 1 tablet (8 mg total) by mouth every 8 (eight) hours as needed for nausea.     oxyCODONE 5 MG/5ML solution  Commonly known as:  ROXICODONE  Take 5 mLs (5 mg total) by mouth every 6 (six) hours as needed.     oxymetazoline 0.05 % nasal spray  Commonly known as:  AFRIN  Place 1 spray into both nostrils as needed for congestion.     polyethylene glycol packet  Commonly known as:  MIRALAX  Take 17 g by mouth daily.     SODIUM CHLORIDE (EXTERNAL) 0.9 % Soln  Use to clean around Trach and perform Trach care once  daily and PRN     sulfamethoxazole-trimethoprim 800-160 MG tablet  Commonly known as:  BACTRIM DS,SEPTRA DS  Take 1 tablet by mouth 2 (two) times daily.     zolpidem 5 MG tablet  Commonly known as:  AMBIEN  Take 1 tablet (5 mg total) by mouth at bedtime as needed for sleep.         PHYSICAL EXAMINATION  Oncology Vitals 01/29/2016 01/22/2016  Height - 178 cm  Weight - 70.806 kg  Weight (lbs) - 156 lbs 2 oz  BMI (kg/m2) - 22.4 kg/m2  Temp 98.3 98.2  Pulse 90 78  Resp - 17  SpO2 100 100  BSA (m2) -  1.87 m2   BP Readings from Last 2 Encounters:  01/29/16 125/71  01/22/16 122/59    Physical Exam  Constitutional: He is oriented to person, place, and time and well-developed, well-nourished, and in no distress.  HENT:  Head: Normocephalic and atraumatic.  Trach intact.  Eyes: Conjunctivae and EOM are normal. Pupils are equal, round, and reactive to light. Right eye exhibits no discharge. Left eye exhibits no discharge. No scleral icterus.  Neck: Normal range of motion.  Pulmonary/Chest: Effort normal. No respiratory distress.  Abdominal:  PEG tube intact.  Musculoskeletal: Normal range of motion. He exhibits no edema or tenderness.  Neurological: He is alert and oriented to person, place, and time. Gait normal.  Skin: Skin is warm and dry. No rash noted. No erythema. No pallor.  Exam today reveals an approximately 1.5 cm in diameter abscess that appears to our E be opened; with purulent plug noted.  There is mild edema and tenderness to the site.  There is no warmth and no red streaks to the site.  It does appear that there is some loculations noted with palpation.      Psychiatric: Affect normal.  Nursing note and vitals reviewed.   LABORATORY DATA:. Appointment on 01/29/2016  Component Date Value Ref Range Status  . WBC 01/29/2016 6.5  4.0 - 10.3 10e3/uL Final  . NEUT# 01/29/2016 5.4  1.5 - 6.5 10e3/uL Final  . HGB 01/29/2016 9.1* 13.0 - 17.1 g/dL Final  . HCT  01/29/2016 26.9* 38.4 - 49.9 % Final  . Platelets 01/29/2016 84* 140 - 400 10e3/uL Final  . MCV 01/29/2016 111.2* 79.3 - 98.0 fL Final  . MCH 01/29/2016 37.6* 27.2 - 33.4 pg Final  . MCHC 01/29/2016 33.8  32.0 - 36.0 g/dL Final  . RBC 01/29/2016 2.42* 4.20 - 5.82 10e6/uL Final  . lymph# 01/29/2016 0.7* 0.9 - 3.3 10e3/uL Final  . MONO# 01/29/2016 0.3  0.1 - 0.9 10e3/uL Final  . Eosinophils Absolute 01/29/2016 0.1  0.0 - 0.5 10e3/uL Final  . Basophils Absolute 01/29/2016 0.0  0.0 - 0.1 10e3/uL Final  . NEUT% 01/29/2016 82.7* 39.0 - 75.0 % Final  . LYMPH% 01/29/2016 11.5* 14.0 - 49.0 % Final  . MONO% 01/29/2016 5.0  0.0 - 14.0 % Final  . EOS% 01/29/2016 0.8  0.0 - 7.0 % Final  . BASO% 01/29/2016 0.0  0.0 - 2.0 % Final  . Magnesium 01/29/2016 2.2  1.5 - 2.5 mg/dl Final  . Sodium 01/29/2016 140  136 - 145 mEq/L Final  . Potassium 01/29/2016 3.6  3.5 - 5.1 mEq/L Final  . Chloride 01/29/2016 104  98 - 109 mEq/L Final  . CO2 01/29/2016 29  22 - 29 mEq/L Final  . Glucose 01/29/2016 106  70 - 140 mg/dl Final   Glucose reference range is for nonfasting patients. Fasting glucose reference range is 70- 100.  Marland Kitchen BUN 01/29/2016 12.7  7.0 - 26.0 mg/dL Final  . Creatinine 01/29/2016 0.8  0.7 - 1.3 mg/dL Final  . Total Bilirubin 01/29/2016 0.40  0.20 - 1.20 mg/dL Final  . Alkaline Phosphatase 01/29/2016 55  40 - 150 U/L Final  . AST 01/29/2016 13  5 - 34 U/L Final  . ALT 01/29/2016 10  0 - 55 U/L Final  . Total Protein 01/29/2016 7.2  6.4 - 8.3 g/dL Final  . Albumin 01/29/2016 3.7  3.5 - 5.0 g/dL Final  . Calcium 01/29/2016 9.4  8.4 - 10.4 mg/dL Final  . Anion Gap 01/29/2016  7  3 - 11 mEq/L Final  . EGFR 01/29/2016 >90  >90 ml/min/1.73 m2 Final   eGFR is calculated using the CKD-EPI Creatinine Equation (2009)  . Aerobic Bacterial Culture 01/29/2016 Preliminary report   Preliminary  . Result 2 01/29/2016 Mixed skin flora   Preliminary   Scant growth  . Result 1 01/29/2016 Comment   Preliminary    Comment: Microbiological testing to rule out the presence of possible pathogens is in progress. Scant growth          RADIOGRAPHIC STUDIES: No results found.  ASSESSMENT/PLAN:    Squamous cell carcinoma of supraglottis [facility]) Patient presented to the [facility] today to receive cycle 4 of his carboplatin/5 FU/Erbitux chemotherapy regimen.  Patient is scheduled to return on 02/02/2016 to receive IV fluid rehydration.  Abscess of face Patient has a large, ruptured, draining abscess to his right cheek.  Patient states that he has chronic abscesses to his bilateral cheek areas in the past.  Exam today reveals an approximately 1.5 cm in diameter abscess that appears to our E be opened; with purulent plug noted.  There is mild edema and tenderness to the site.  There is no warmth and no red streaks to the site.  It does appear that there is some loculations noted with palpation.  Was able to remove the purulent plug from the abscess with minimal manipulation.  Removal of the purulent plug created an open wound; which was then flushed well with sterile saline.  Following the flushing-patient's abscess was packed with iodoform.  Bandage was placed over abscess area.  Also, was able to obtain a wound culture of the site.  Pending results.  Patient was advised to attempt keeping the iodoform intact for as long as possible.  Advised patient that the iodoform packing would eventually fall out; that this was okay.  Patient was advised to call/return if site does not slowly resolve; or he develops any worsening symptoms whatsoever.   Patient stated understanding of all instructions; and was in agreement with this plan of care. The patient knows to call the clinic with any problems, questions or concerns.   Review/collaboration with Dr. Alvy Bimler regarding all aspects of patient's visit today.   Total time spent with patient was 40 minutes;  with greater than 75 percent of that time spent in  face to face counseling regarding patient's symptoms,  and coordination of care and follow up.  Disclaimer:This dictation was prepared with Dragon/digital dictation along with [employer]. Any transcriptional errors that result from this process are unintentional.  Drue Second, NP 02/01/2016

## 2016-02-02 ENCOUNTER — Ambulatory Visit (HOSPITAL_BASED_OUTPATIENT_CLINIC_OR_DEPARTMENT_OTHER): Payer: Medicaid Other

## 2016-02-02 VITALS — BP 115/67 | HR 90 | Temp 98.7°F

## 2016-02-02 DIAGNOSIS — C321 Malignant neoplasm of supraglottis: Secondary | ICD-10-CM | POA: Diagnosis not present

## 2016-02-02 MED ORDER — HYDROMORPHONE HCL 4 MG/ML IJ SOLN
2.0000 mg | INTRAMUSCULAR | Status: DC | PRN
  Administered 2016-02-02: 2 mg via INTRAVENOUS

## 2016-02-02 MED ORDER — SODIUM CHLORIDE 0.9 % IV SOLN
Freq: Once | INTRAVENOUS | Status: AC
  Administered 2016-02-02: 15:00:00 via INTRAVENOUS

## 2016-02-02 MED ORDER — SODIUM CHLORIDE 0.9 % IV SOLN
Freq: Once | INTRAVENOUS | Status: AC
  Administered 2016-02-02: 15:00:00 via INTRAVENOUS
  Filled 2016-02-02: qty 4

## 2016-02-02 MED ORDER — HEPARIN SOD (PORK) LOCK FLUSH 100 UNIT/ML IV SOLN
500.0000 [IU] | Freq: Once | INTRAVENOUS | Status: AC | PRN
  Administered 2016-02-02: 500 [IU]
  Filled 2016-02-02: qty 5

## 2016-02-02 MED ORDER — SODIUM CHLORIDE 0.9 % IJ SOLN
10.0000 mL | INTRAMUSCULAR | Status: DC | PRN
  Administered 2016-02-02: 10 mL
  Filled 2016-02-02: qty 10

## 2016-02-02 MED ORDER — HYDROMORPHONE HCL 4 MG/ML IJ SOLN
INTRAMUSCULAR | Status: AC
  Filled 2016-02-02: qty 1

## 2016-02-02 NOTE — Patient Instructions (Signed)

## 2016-02-03 ENCOUNTER — Ambulatory Visit (HOSPITAL_BASED_OUTPATIENT_CLINIC_OR_DEPARTMENT_OTHER): Payer: Medicaid Other

## 2016-02-03 VITALS — BP 103/64 | HR 79 | Temp 98.3°F | Resp 18

## 2016-02-03 DIAGNOSIS — C321 Malignant neoplasm of supraglottis: Secondary | ICD-10-CM

## 2016-02-03 MED ORDER — ALTEPLASE 2 MG IJ SOLR
2.0000 mg | Freq: Once | INTRAMUSCULAR | Status: DC | PRN
  Filled 2016-02-03: qty 2

## 2016-02-03 MED ORDER — HYDROMORPHONE HCL 4 MG/ML IJ SOLN
2.0000 mg | INTRAMUSCULAR | Status: DC | PRN
  Administered 2016-02-03: 2 mg via INTRAVENOUS

## 2016-02-03 MED ORDER — SODIUM CHLORIDE 0.9 % IV SOLN
Freq: Once | INTRAVENOUS | Status: AC
  Administered 2016-02-03: 09:00:00 via INTRAVENOUS

## 2016-02-03 MED ORDER — SODIUM CHLORIDE 0.9 % IJ SOLN
10.0000 mL | INTRAMUSCULAR | Status: DC | PRN
  Administered 2016-02-03: 10 mL
  Filled 2016-02-03: qty 10

## 2016-02-03 MED ORDER — HEPARIN SOD (PORK) LOCK FLUSH 100 UNIT/ML IV SOLN
500.0000 [IU] | Freq: Once | INTRAVENOUS | Status: AC | PRN
  Administered 2016-02-03: 500 [IU]
  Filled 2016-02-03: qty 5

## 2016-02-03 MED ORDER — HYDROMORPHONE HCL 4 MG/ML IJ SOLN
INTRAMUSCULAR | Status: AC
  Filled 2016-02-03: qty 1

## 2016-02-03 MED ORDER — SODIUM CHLORIDE 0.9 % IV SOLN
Freq: Once | INTRAVENOUS | Status: AC
  Administered 2016-02-03: 09:00:00 via INTRAVENOUS
  Filled 2016-02-03: qty 4

## 2016-02-03 NOTE — Patient Instructions (Signed)

## 2016-02-04 ENCOUNTER — Ambulatory Visit (HOSPITAL_BASED_OUTPATIENT_CLINIC_OR_DEPARTMENT_OTHER): Payer: Medicaid Other

## 2016-02-04 VITALS — BP 128/75 | HR 74 | Temp 98.7°F | Resp 18

## 2016-02-04 DIAGNOSIS — C321 Malignant neoplasm of supraglottis: Secondary | ICD-10-CM | POA: Diagnosis not present

## 2016-02-04 MED ORDER — SODIUM CHLORIDE 0.9 % IJ SOLN
10.0000 mL | INTRAMUSCULAR | Status: DC | PRN
  Administered 2016-02-04: 10 mL
  Filled 2016-02-04: qty 10

## 2016-02-04 MED ORDER — HYDROMORPHONE HCL 4 MG/ML IJ SOLN: 2.0000 mg | INTRAMUSCULAR | Status: DC | PRN

## 2016-02-04 MED ORDER — HEPARIN SOD (PORK) LOCK FLUSH 100 UNIT/ML IV SOLN
500.0000 [IU] | Freq: Once | INTRAVENOUS | Status: AC | PRN
  Administered 2016-02-04: 500 [IU]
  Filled 2016-02-04: qty 5

## 2016-02-04 MED ORDER — SODIUM CHLORIDE 0.9 % IV SOLN
Freq: Once | INTRAVENOUS | Status: AC
  Administered 2016-02-04: 09:00:00 via INTRAVENOUS

## 2016-02-04 MED ORDER — SODIUM CHLORIDE 0.9 % IV SOLN
Freq: Once | INTRAVENOUS | Status: AC
  Administered 2016-02-04: 10:00:00 via INTRAVENOUS
  Filled 2016-02-04: qty 4

## 2016-02-04 NOTE — Patient Instructions (Signed)

## 2016-02-05 ENCOUNTER — Ambulatory Visit: Payer: Medicaid Other

## 2016-02-05 ENCOUNTER — Other Ambulatory Visit (HOSPITAL_BASED_OUTPATIENT_CLINIC_OR_DEPARTMENT_OTHER): Payer: Medicaid Other

## 2016-02-05 ENCOUNTER — Ambulatory Visit (HOSPITAL_BASED_OUTPATIENT_CLINIC_OR_DEPARTMENT_OTHER): Payer: Medicaid Other

## 2016-02-05 VITALS — BP 102/62 | HR 82 | Temp 98.4°F | Resp 18

## 2016-02-05 DIAGNOSIS — Z5112 Encounter for antineoplastic immunotherapy: Secondary | ICD-10-CM

## 2016-02-05 DIAGNOSIS — C321 Malignant neoplasm of supraglottis: Secondary | ICD-10-CM

## 2016-02-05 LAB — COMPREHENSIVE METABOLIC PANEL
ALBUMIN: 3.6 g/dL (ref 3.5–5.0)
ALK PHOS: 50 U/L (ref 40–150)
ALT: <9 U/L (ref 0–55)
ANION GAP: 7 meq/L (ref 3–11)
AST: 16 U/L (ref 5–34)
BUN: 10 mg/dL (ref 7.0–26.0)
CALCIUM: 9.2 mg/dL (ref 8.4–10.4)
CO2: 26 mEq/L (ref 22–29)
Chloride: 106 mEq/L (ref 98–109)
Creatinine: 0.7 mg/dL (ref 0.7–1.3)
Glucose: 112 mg/dl (ref 70–140)
POTASSIUM: 3.7 meq/L (ref 3.5–5.1)
Sodium: 140 mEq/L (ref 136–145)
Total Bilirubin: 0.37 mg/dL (ref 0.20–1.20)
Total Protein: 6.7 g/dL (ref 6.4–8.3)

## 2016-02-05 LAB — CBC WITH DIFFERENTIAL/PLATELET
BASO%: 0.2 % (ref 0.0–2.0)
Basophils Absolute: 0 10*3/uL (ref 0.0–0.1)
EOS ABS: 0.1 10*3/uL (ref 0.0–0.5)
EOS%: 1.9 % (ref 0.0–7.0)
HCT: 25 % — ABNORMAL LOW (ref 38.4–49.9)
HEMOGLOBIN: 8.6 g/dL — AB (ref 13.0–17.1)
LYMPH#: 0.4 10*3/uL — AB (ref 0.9–3.3)
LYMPH%: 12.8 % — AB (ref 14.0–49.0)
MCH: 39.4 pg — ABNORMAL HIGH (ref 27.2–33.4)
MCHC: 34.3 g/dL (ref 32.0–36.0)
MCV: 114.7 fL — AB (ref 79.3–98.0)
MONO#: 0.2 10*3/uL (ref 0.1–0.9)
MONO%: 8 % (ref 0.0–14.0)
NEUT%: 77.1 % — ABNORMAL HIGH (ref 39.0–75.0)
NEUTROS ABS: 2.3 10*3/uL (ref 1.5–6.5)
PLATELETS: 153 10*3/uL (ref 140–400)
RBC: 2.18 10*6/uL — AB (ref 4.20–5.82)
RDW: 24.6 % — AB (ref 11.0–14.6)
WBC: 3 10*3/uL — AB (ref 4.0–10.3)

## 2016-02-05 LAB — MAGNESIUM: Magnesium: 1.7 mg/dl (ref 1.5–2.5)

## 2016-02-05 MED ORDER — CETUXIMAB CHEMO IV INJECTION 200 MG/100ML
250.0000 mg/m2 | Freq: Once | INTRAVENOUS | Status: AC
  Administered 2016-02-05: 500 mg via INTRAVENOUS
  Filled 2016-02-05: qty 100

## 2016-02-05 MED ORDER — DIPHENHYDRAMINE HCL 50 MG/ML IJ SOLN
INTRAMUSCULAR | Status: AC
  Filled 2016-02-05: qty 1

## 2016-02-05 MED ORDER — DIPHENHYDRAMINE HCL 50 MG/ML IJ SOLN
50.0000 mg | Freq: Once | INTRAMUSCULAR | Status: AC
  Administered 2016-02-05: 50 mg via INTRAVENOUS

## 2016-02-05 MED ORDER — SODIUM CHLORIDE 0.9 % IV SOLN
Freq: Once | INTRAVENOUS | Status: AC
  Administered 2016-02-05: 10:00:00 via INTRAVENOUS

## 2016-02-05 MED ORDER — HEPARIN SOD (PORK) LOCK FLUSH 100 UNIT/ML IV SOLN
500.0000 [IU] | Freq: Once | INTRAVENOUS | Status: AC | PRN
  Administered 2016-02-05: 500 [IU]
  Filled 2016-02-05: qty 5

## 2016-02-05 MED ORDER — SODIUM CHLORIDE 0.9 % IJ SOLN
10.0000 mL | INTRAMUSCULAR | Status: DC | PRN
  Administered 2016-02-05: 10 mL
  Filled 2016-02-05: qty 10

## 2016-02-05 MED ORDER — SODIUM CHLORIDE 0.9% FLUSH
10.0000 mL | INTRAVENOUS | Status: DC | PRN
Start: 2016-02-05 — End: 2016-02-05
  Administered 2016-02-05: 10 mL via INTRAVENOUS
  Filled 2016-02-05: qty 10

## 2016-02-05 NOTE — Patient Instructions (Signed)
[facility] Discharge Instructions for Patients Receiving Chemotherapy  Today you received the following chemotherapy agents erbitux  To help prevent nausea and vomiting after your treatment, we encourage you to take your nausea medication as directed  If you develop nausea and vomiting that is not controlled by your nausea medication, call the clinic.   BELOW ARE SYMPTOMS THAT SHOULD BE REPORTED IMMEDIATELY:  *FEVER GREATER THAN 100.5 F  *CHILLS WITH OR WITHOUT FEVER  NAUSEA AND VOMITING THAT IS NOT CONTROLLED WITH YOUR NAUSEA MEDICATION  *UNUSUAL SHORTNESS OF BREATH  *UNUSUAL BRUISING OR BLEEDING  TENDERNESS IN MOUTH AND THROAT WITH OR WITHOUT PRESENCE OF ULCERS  *URINARY PROBLEMS  *BOWEL PROBLEMS  UNUSUAL RASH Items with * indicate a potential emergency and should be followed up as soon as possible.  Feel free to call the clinic you have any questions or concerns. The clinic phone number is [phone].  

## 2016-02-09 MED FILL — ONDANSETRON HCL 8 MG TABLET: 8 | 20 days supply | Qty: 60 | Fill #2

## 2016-02-10 ENCOUNTER — Other Ambulatory Visit: Payer: Self-pay | Admitting: Hematology and Oncology

## 2016-02-12 ENCOUNTER — Other Ambulatory Visit: Payer: Self-pay | Admitting: Otolaryngology

## 2016-02-12 ENCOUNTER — Other Ambulatory Visit (HOSPITAL_BASED_OUTPATIENT_CLINIC_OR_DEPARTMENT_OTHER): Payer: Medicaid Other

## 2016-02-12 ENCOUNTER — Telehealth: Payer: Self-pay | Admitting: *Deleted

## 2016-02-12 ENCOUNTER — Other Ambulatory Visit: Payer: Self-pay | Admitting: Hematology and Oncology

## 2016-02-12 ENCOUNTER — Ambulatory Visit (HOSPITAL_BASED_OUTPATIENT_CLINIC_OR_DEPARTMENT_OTHER): Payer: Medicaid Other

## 2016-02-12 ENCOUNTER — Ambulatory Visit: Payer: Medicaid Other

## 2016-02-12 VITALS — BP 117/69 | HR 65 | Temp 97.3°F | Resp 18

## 2016-02-12 DIAGNOSIS — R1310 Dysphagia, unspecified: Secondary | ICD-10-CM

## 2016-02-12 DIAGNOSIS — Z5112 Encounter for antineoplastic immunotherapy: Secondary | ICD-10-CM | POA: Diagnosis not present

## 2016-02-12 DIAGNOSIS — C321 Malignant neoplasm of supraglottis: Secondary | ICD-10-CM

## 2016-02-12 DIAGNOSIS — Z95828 Presence of other vascular implants and grafts: Secondary | ICD-10-CM

## 2016-02-12 LAB — CBC WITH DIFFERENTIAL/PLATELET
BASO%: 0.3 % (ref 0.0–2.0)
BASOS ABS: 0 10*3/uL (ref 0.0–0.1)
EOS ABS: 0 10*3/uL (ref 0.0–0.5)
EOS%: 1 % (ref 0.0–7.0)
HCT: 28.7 % — ABNORMAL LOW (ref 38.4–49.9)
HEMOGLOBIN: 9.8 g/dL — AB (ref 13.0–17.1)
LYMPH%: 11.5 % — AB (ref 14.0–49.0)
MCH: 40.1 pg — AB (ref 27.2–33.4)
MCHC: 34.2 g/dL (ref 32.0–36.0)
MCV: 117.5 fL — AB (ref 79.3–98.0)
MONO#: 0.5 10*3/uL (ref 0.1–0.9)
MONO%: 14.4 % — AB (ref 0.0–14.0)
NEUT#: 2.5 10*3/uL (ref 1.5–6.5)
NEUT%: 72.8 % (ref 39.0–75.0)
Platelets: 183 10*3/uL (ref 140–400)
RBC: 2.44 10*6/uL — AB (ref 4.20–5.82)
RDW: 22.8 % — AB (ref 11.0–14.6)
WBC: 3.5 10*3/uL — AB (ref 4.0–10.3)
lymph#: 0.4 10*3/uL — ABNORMAL LOW (ref 0.9–3.3)

## 2016-02-12 LAB — COMPREHENSIVE METABOLIC PANEL
ALT: <9 U/L (ref 0–55)
ANION GAP: 8 meq/L (ref 3–11)
AST: 13 U/L (ref 5–34)
Albumin: 3.8 g/dL (ref 3.5–5.0)
Alkaline Phosphatase: 55 U/L (ref 40–150)
BUN: 11.5 mg/dL (ref 7.0–26.0)
CHLORIDE: 105 meq/L (ref 98–109)
CO2: 27 mEq/L (ref 22–29)
Calcium: 9.5 mg/dL (ref 8.4–10.4)
Creatinine: 0.7 mg/dL (ref 0.7–1.3)
GLUCOSE: 90 mg/dL (ref 70–140)
POTASSIUM: 3.9 meq/L (ref 3.5–5.1)
SODIUM: 140 meq/L (ref 136–145)
Total Bilirubin: 0.43 mg/dL (ref 0.20–1.20)
Total Protein: 7.1 g/dL (ref 6.4–8.3)

## 2016-02-12 LAB — MAGNESIUM: MAGNESIUM: 2 mg/dL (ref 1.5–2.5)

## 2016-02-12 MED ORDER — SODIUM CHLORIDE 0.9 % IJ SOLN
10.0000 mL | INTRAMUSCULAR | Status: DC | PRN
  Administered 2016-02-12: 10 mL
  Filled 2016-02-12: qty 10

## 2016-02-12 MED ORDER — CETUXIMAB CHEMO IV INJECTION 200 MG/100ML
250.0000 mg/m2 | Freq: Once | INTRAVENOUS | Status: AC
  Administered 2016-02-12: 500 mg via INTRAVENOUS
  Filled 2016-02-12: qty 200

## 2016-02-12 MED ORDER — DIPHENHYDRAMINE HCL 50 MG/ML IJ SOLN
50.0000 mg | Freq: Once | INTRAMUSCULAR | Status: AC
  Administered 2016-02-12: 50 mg via INTRAVENOUS

## 2016-02-12 MED ORDER — HEPARIN SOD (PORK) LOCK FLUSH 100 UNIT/ML IV SOLN
500.0000 [IU] | Freq: Once | INTRAVENOUS | Status: AC | PRN
  Administered 2016-02-12: 500 [IU]
  Filled 2016-02-12: qty 5

## 2016-02-12 MED ORDER — DIPHENHYDRAMINE HCL 50 MG/ML IJ SOLN
INTRAMUSCULAR | Status: AC
  Filled 2016-02-12: qty 1

## 2016-02-12 MED ORDER — SODIUM CHLORIDE 0.9 % IV SOLN
Freq: Once | INTRAVENOUS | Status: AC
  Administered 2016-02-12: 10:00:00 via INTRAVENOUS

## 2016-02-12 MED ORDER — SODIUM CHLORIDE 0.9% FLUSH
10.0000 mL | INTRAVENOUS | Status: DC | PRN
  Administered 2016-02-12: 10 mL via INTRAVENOUS
  Filled 2016-02-12: qty 10

## 2016-02-12 NOTE — Patient Instructions (Signed)

## 2016-02-12 NOTE — Patient Instructions (Signed)
[facility] Discharge Instructions for Patients Receiving Chemotherapy  Today you received the following chemotherapy agents Erbitux To help prevent nausea and vomiting after your treatment, we encourage you to take your nausea medication as prescribed.   If you develop nausea and vomiting that is not controlled by your nausea medication, call the clinic.   BELOW ARE SYMPTOMS THAT SHOULD BE REPORTED IMMEDIATELY:  *FEVER GREATER THAN 100.5 F  *CHILLS WITH OR WITHOUT FEVER  NAUSEA AND VOMITING THAT IS NOT CONTROLLED WITH YOUR NAUSEA MEDICATION  *UNUSUAL SHORTNESS OF BREATH  *UNUSUAL BRUISING OR BLEEDING  TENDERNESS IN MOUTH AND THROAT WITH OR WITHOUT PRESENCE OF ULCERS  *URINARY PROBLEMS  *BOWEL PROBLEMS  UNUSUAL RASH Items with * indicate a potential emergency and should be followed up as soon as possible.  Feel free to call the clinic you have any questions or concerns. The clinic phone number is [phone].  Please show the CHEMO ALERT CARD at check-in to the Emergency Department and triage nurse.   

## 2016-02-12 NOTE — Telephone Encounter (Signed)
Pt states he was told by Dr. [name] he was going to have surgery on his throat next week, but he doesn't know which day yet.  I called Dr. Noreene Filbert office and s/w Amy.  She says Dr. [name] wants pt to have Barium Swallow done first and if that shows any aspiration then Dr. [name] will proceed w/ surgery on pt's Trach and do a biopsy at same time.  She will work on setting this up and contact pt w/ appt d/t.  Informed Amy pt has Treatment here next Friday and to please try to arrange procedure/surgery for day other than Friday if possible.   Informed pt that Amy will be contacting him with appointments.  He verbalized understanding.

## 2016-02-12 NOTE — Progress Notes (Signed)
Patient refused to stay one hour after transfusion for observation.

## 2016-02-15 ENCOUNTER — Ambulatory Visit
Admission: RE | Admit: 2016-02-15 | Discharge: 2016-02-15 | Disposition: A | Discharge status: Home / Self Care | Payer: Medicaid Other | Source: Ambulatory Visit | Attending: Otolaryngology | Admitting: Otolaryngology

## 2016-02-15 DIAGNOSIS — R1310 Dysphagia, unspecified: Secondary | ICD-10-CM

## 2016-02-19 ENCOUNTER — Telehealth: Payer: Self-pay | Admitting: *Deleted

## 2016-02-19 ENCOUNTER — Ambulatory Visit (HOSPITAL_BASED_OUTPATIENT_CLINIC_OR_DEPARTMENT_OTHER): Payer: Medicaid Other | Admitting: Hematology and Oncology

## 2016-02-19 ENCOUNTER — Ambulatory Visit: Payer: Medicaid Other

## 2016-02-19 ENCOUNTER — Ambulatory Visit (HOSPITAL_BASED_OUTPATIENT_CLINIC_OR_DEPARTMENT_OTHER): Payer: Medicaid Other

## 2016-02-19 ENCOUNTER — Telehealth: Payer: Self-pay | Admitting: Hematology and Oncology

## 2016-02-19 ENCOUNTER — Other Ambulatory Visit (HOSPITAL_BASED_OUTPATIENT_CLINIC_OR_DEPARTMENT_OTHER): Payer: Medicaid Other

## 2016-02-19 VITALS — BP 114/74 | HR 90 | Temp 98.6°F | Resp 18 | Wt 155.7 lb

## 2016-02-19 DIAGNOSIS — C770 Secondary and unspecified malignant neoplasm of lymph nodes of head, face and neck: Secondary | ICD-10-CM

## 2016-02-19 DIAGNOSIS — C321 Malignant neoplasm of supraglottis: Secondary | ICD-10-CM | POA: Diagnosis not present

## 2016-02-19 DIAGNOSIS — Z5112 Encounter for antineoplastic immunotherapy: Secondary | ICD-10-CM

## 2016-02-19 DIAGNOSIS — T451X5A Adverse effect of antineoplastic and immunosuppressive drugs, initial encounter: Secondary | ICD-10-CM

## 2016-02-19 DIAGNOSIS — G893 Neoplasm related pain (acute) (chronic): Secondary | ICD-10-CM | POA: Diagnosis not present

## 2016-02-19 DIAGNOSIS — Z720 Tobacco use: Secondary | ICD-10-CM | POA: Diagnosis not present

## 2016-02-19 DIAGNOSIS — D61810 Antineoplastic chemotherapy induced pancytopenia: Secondary | ICD-10-CM

## 2016-02-19 DIAGNOSIS — C779 Secondary and unspecified malignant neoplasm of lymph node, unspecified: Secondary | ICD-10-CM | POA: Insufficient documentation

## 2016-02-19 DIAGNOSIS — R110 Nausea: Secondary | ICD-10-CM | POA: Diagnosis not present

## 2016-02-19 DIAGNOSIS — Z95828 Presence of other vascular implants and grafts: Secondary | ICD-10-CM

## 2016-02-19 LAB — COMPREHENSIVE METABOLIC PANEL
ALBUMIN: 3.6 g/dL (ref 3.5–5.0)
ALK PHOS: 58 U/L (ref 40–150)
ANION GAP: 9 meq/L (ref 3–11)
AST: 14 U/L (ref 5–34)
BILIRUBIN TOTAL: 0.34 mg/dL (ref 0.20–1.20)
BUN: 10.4 mg/dL (ref 7.0–26.0)
CALCIUM: 9.1 mg/dL (ref 8.4–10.4)
CHLORIDE: 104 meq/L (ref 98–109)
CO2: 28 mEq/L (ref 22–29)
CREATININE: 0.7 mg/dL (ref 0.7–1.3)
EGFR: >90 mL/min/{1.73_m2} (ref >90)
Glucose: 85 mg/dl (ref 70–140)
Potassium: 3.8 mEq/L (ref 3.5–5.1)
Sodium: 141 mEq/L (ref 136–145)
TOTAL PROTEIN: 7 g/dL (ref 6.4–8.3)

## 2016-02-19 LAB — CBC WITH DIFFERENTIAL/PLATELET
BASO%: 0.3 % (ref 0.0–2.0)
Basophils Absolute: 0 10*3/uL (ref 0.0–0.1)
EOS%: 1.8 % (ref 0.0–7.0)
Eosinophils Absolute: 0.1 10*3/uL (ref 0.0–0.5)
HEMATOCRIT: 31.1 % — AB (ref 38.4–49.9)
HEMOGLOBIN: 10.5 g/dL — AB (ref 13.0–17.1)
LYMPH#: 0.5 10*3/uL — AB (ref 0.9–3.3)
LYMPH%: 13.6 % — ABNORMAL LOW (ref 14.0–49.0)
MCH: 39.7 pg — ABNORMAL HIGH (ref 27.2–33.4)
MCHC: 33.8 g/dL (ref 32.0–36.0)
MCV: 117.5 fL — ABNORMAL HIGH (ref 79.3–98.0)
MONO#: 0.6 10*3/uL (ref 0.1–0.9)
MONO%: 15.4 % — ABNORMAL HIGH (ref 0.0–14.0)
NEUT%: 68.9 % (ref 39.0–75.0)
NEUTROS ABS: 2.7 10*3/uL (ref 1.5–6.5)
PLATELETS: 157 10*3/uL (ref 140–400)
RBC: 2.65 10*6/uL — ABNORMAL LOW (ref 4.20–5.82)
RDW: 19.9 % — AB (ref 11.0–14.6)
WBC: 3.9 10*3/uL — ABNORMAL LOW (ref 4.0–10.3)

## 2016-02-19 LAB — MAGNESIUM: MAGNESIUM: 1.8 mg/dL (ref 1.5–2.5)

## 2016-02-19 MED ORDER — SODIUM CHLORIDE 0.9 % IV SOLN
Freq: Once | INTRAVENOUS | Status: AC
  Administered 2016-02-19: 10:00:00 via INTRAVENOUS

## 2016-02-19 MED ORDER — DIPHENHYDRAMINE HCL 50 MG/ML IJ SOLN
50.0000 mg | Freq: Once | INTRAMUSCULAR | Status: AC
  Administered 2016-02-19: 50 mg via INTRAVENOUS

## 2016-02-19 MED ORDER — CETUXIMAB CHEMO IV INJECTION 200 MG/100ML
250.0000 mg/m2 | Freq: Once | INTRAVENOUS | Status: AC
  Administered 2016-02-19: 500 mg via INTRAVENOUS
  Filled 2016-02-19: qty 200

## 2016-02-19 MED ORDER — DIPHENHYDRAMINE HCL 50 MG/ML IJ SOLN
INTRAMUSCULAR | Status: AC
  Filled 2016-02-19: qty 1

## 2016-02-19 MED ORDER — SODIUM CHLORIDE 0.9 % IJ SOLN
10.0000 mL | INTRAMUSCULAR | Status: DC | PRN
  Administered 2016-02-19: 10 mL
  Filled 2016-02-19: qty 10

## 2016-02-19 MED ORDER — SODIUM CHLORIDE 0.9% FLUSH
10.0000 mL | INTRAVENOUS | Status: DC | PRN
  Administered 2016-02-19: 10 mL via INTRAVENOUS
  Filled 2016-02-19: qty 10

## 2016-02-19 MED ORDER — OXYCODONE HCL 5 MG/5ML PO SOLN: 5.0000 mg | Freq: Four times a day (QID) | ORAL | Status: DC | PRN

## 2016-02-19 MED ORDER — HEPARIN SOD (PORK) LOCK FLUSH 100 UNIT/ML IV SOLN
500.0000 [IU] | Freq: Once | INTRAVENOUS | Status: AC | PRN
  Administered 2016-02-19: 500 [IU]
  Filled 2016-02-19: qty 5

## 2016-02-19 NOTE — Assessment & Plan Note (Signed)
His currently using a nicotine patch. I continue to encourage him to quit smoking.

## 2016-02-19 NOTE — Assessment & Plan Note (Signed)
He has chronic pain in his throat related to cancer. He has planned surgery next week and for further ENT evaluation. I refill his prescription of pain medicine and reinforced narcotic refill policy

## 2016-02-19 NOTE — Assessment & Plan Note (Signed)
He tolerated treatment poorly with significant nausea, vomiting and pancytopenia despite mild dose adjustment. I plan to adjust premedications by adding amend and to continue his chemotherapy with minor dose adjustment  The patient wants to delay treatment due to anticipated surgery on 02/25/2016 and a wedding that he plans a 10 on 02/20/2016 He will get cetuximab only today and I will delay cycle 5 to start on 02/29/16 I plan to repeat another PET CT scan after he completed 6 cycles of chemotherapy

## 2016-02-19 NOTE — Telephone Encounter (Signed)
per po to sch pt appt-sent MW emailt o sch trmt-pt to get updated copy b4 leaving trmt

## 2016-02-19 NOTE — Assessment & Plan Note (Signed)
This is likely due to recent treatment.  As above, I plan to reduce dose of treatment in the future No need transfusion needed

## 2016-02-19 NOTE — Patient Instructions (Signed)
[facility] Discharge Instructions for Patients Receiving Chemotherapy  Today you received the following chemotherapy agents:  Cetuximab.   To help prevent nausea and vomiting after your treatment, we encourage you to take your nausea medication as prescribed.   If you develop nausea and vomiting that is not controlled by your nausea medication, call the clinic.   BELOW ARE SYMPTOMS THAT SHOULD BE REPORTED IMMEDIATELY:  *FEVER GREATER THAN 100.5 F  *CHILLS WITH OR WITHOUT FEVER  NAUSEA AND VOMITING THAT IS NOT CONTROLLED WITH YOUR NAUSEA MEDICATION  *UNUSUAL SHORTNESS OF BREATH  *UNUSUAL BRUISING OR BLEEDING  TENDERNESS IN MOUTH AND THROAT WITH OR WITHOUT PRESENCE OF ULCERS  *URINARY PROBLEMS  *BOWEL PROBLEMS  UNUSUAL RASH Items with * indicate a potential emergency and should be followed up as soon as possible.  Feel free to call the clinic you have any questions or concerns. The clinic phone number is [phone].  Please show the [place] at check-in to the Emergency Department and triage nurse.

## 2016-02-19 NOTE — Assessment & Plan Note (Signed)
This is quite severe with last cycle of treatment. I recommended adding Emend

## 2016-02-19 NOTE — Progress Notes (Signed)
Patient was observed 1hr post Erbitux.  Ambulated well, discharged home with no complaints.

## 2016-02-19 NOTE — Progress Notes (Signed)
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[place])   03/15/2015 - 03/17/2015 Hospital Admission He was admitted to the hospital for evaluation of dysphagia, SOB, hemoptosis, hoarseness, 30-40 pound weight loss and worsening bilateral neck masses for 5 months   03/15/2015 Imaging Ct showed extensive circumferential malignancy in the hypopharyngeal/supraglottic region with regional LN metastases   03/16/2015 Procedure He underwent ULTRASOUND-GUIDED BIOPSY OF LEFT CERVICAL LYMPH NODES   03/16/2015 Pathology Results Accession: [id number] LN biopsy showed invasive squamous cell cancer   03/16/2015 Pathology Results Accession: [id number] showed atypical squamous cells   03/25/2015 - 04/07/2015 Hospital Admission He was admitted to the hospital and underwent tracheostomy placement, feeeding tube placement but subsequently left [facility]   03/26/2015 Surgery He had multiple extraction of tooth numbers 1, 2, 5, 6, 7, 8, 9, 10, 11, 12, 13, 18, 19, 21, 22, 23, 24, 25, 26, 27, 28, and 29. and 4 Quadrants of alveoloplasty   03/26/2015 Surgery He underwent tracheostomy   03/31/2015 Surgery He had open gastrostomy tube placement by Dr. Donne Hazel   04/16/2015 - 05/18/2015 Radiation Therapy Laryngopharynx and bilateral neck / 50 Gy in 20 fractions to gross disease, 45 Gy in 20 fractions to high risk nodal echelons  Beams/energy: Helical IMRT / 6 MV photons   04/16/2015 Procedure Fluoroscopic reposition of the 18 French gastrostomy confirmed back in the stomach,   07/03/2015 Procedure IR performed replacement of gastrostomy tube with a new 73 French balloon retention tube   10/01/2015 Imaging PEt  scan showed persistent hypermetabolism within the primary supraglottic laryngeal tumor and within right retropharyngeal, bilateral level II and left level IV cervical nodal metastases   10/28/2015 Procedure He had placement of PICC line. The IR was not able to place PORT due to suspected upper respiratory infection   11/13/2015 -  Chemotherapy He received 5FU, carboplatin chemo with weekly Erbitux   11/19/2015 Surgery Gastrostomy tube replaced.   11/30/2015 Procedure Placement of right jugular port-a-cath.   11/30/2015 Procedure PICC removed.   12/30/2015 Imaging PET CT showed positive response to Rx    INTERVAL HISTORY: Please see below for problem oriented charting. He is seen prior to cycle 5 of chemotherapy. He had severe nausea and vomiting, slightly better compared to prior cycle of treatment. He had recent skin infection, healed. He is planning to attend a wedding this weekend and he has surgery next week. He is requesting the treatment schedule to be modified He continues to have persistent pain in his throat, stable on current prescription of oxycodone Denies constipation  REVIEW OF SYSTEMS:   Constitutional: Denies fevers, chills or abnormal weight loss Eyes: Denies blurriness of visionthroat Respiratory: Denies cough, dyspnea or wheezes Cardiovascular: Denies palpitation, chest discomfort or lower extremity swelling Lymphatics: Denies new lymphadenopathy or easy bruising Neurological:Denies numbness, tingling or new weaknesses Behavioral/Psych: Mood is stable, no new changes  All other systems were reviewed with the patient and are negative.  I have reviewed the past medical history, past surgical history, social history and family history with the patient and they are unchanged from previous note.  ALLERGIES:  is allergic to aspirin.  MEDICATIONS:  Current Outpatient Prescriptions  Medication Sig Dispense Refill  . emollient (  BIAFINE) cream Apply 1 application topically 2  (two) times daily. Reported on 12/02/2015    . fluticasone (FLONASE) 50 MCG/ACT nasal spray Place 2 sprays into both nostrils daily. 16 g 2  . ibuprofen (ADVIL,MOTRIN) 200 MG tablet Take 200 mg by mouth every 6 (six) hours as needed for mild pain. Reported on 12/11/2015    . lansoprazole (PREVACID) 30 MG capsule Take 1 capsule (30 mg total) by mouth 2 (two) times daily before a meal. 60 capsule 6  . levothyroxine (LEVOTHROID) 25 MCG tablet Take 1 tablet (25 mcg total) by mouth daily before breakfast. 30 tablet 3  . lidocaine (XYLOCAINE) 2 % solution Use as directed 15 mLs in the mouth or throat every 8 (eight) hours as needed for mouth pain. 100 mL 0  . lidocaine-prilocaine (EMLA) cream Apply 1 application topically as needed. 30 g 0  . LORazepam (ATIVAN) 0.5 MG tablet Take 1-2 tablets before PET/ CT Scans. 6 tablet 0  . metoCLOPramide (REGLAN) 5 MG/5ML solution Place 5 mLs (5 mg total) into feeding tube 4 (four) times daily -  before meals and at bedtime. 240 mL 0  . nicotine (NICODERM CQ) 7 mg/24hr patch apply 7 mg patch daily x 4 wks after quitting smoking 14 patch 6  . Nutritional Supplements (FEEDING SUPPLEMENT, OSMOLITE 1.2 CAL,) LIQD Give 2 cans osmolite 1.2 QID with 60 cc free water before and after bolus feeding.  Give an additional 240 cc free water via PEG daily. 1896 mL 0  . omeprazole (PRILOSEC) 20 MG capsule Take 1 capsule (20 mg total) by mouth 2 (two) times daily. 60 capsule 0  . ondansetron (ZOFRAN) 8 MG tablet Take 1 tablet (8 mg total) by mouth every 8 (eight) hours as needed for nausea. 60 tablet 3  . oxyCODONE (ROXICODONE) 5 MG/5ML solution Take 5 mLs (5 mg total) by mouth every 6 (six) hours as needed. 473 mL 0  . oxymetazoline (AFRIN) 0.05 % nasal spray Place 1 spray into both nostrils as needed for congestion.    . polyethylene glycol (MIRALAX) packet Take 17 g by mouth daily. 14 each 0  . SODIUM CHLORIDE, EXTERNAL, 0.9 % SOLN Use to clean around Trach and perform Trach care  once daily and PRN 1000 mL 11  . zolpidem (AMBIEN) 5 MG tablet Take 1 tablet (5 mg total) by mouth at bedtime as needed for sleep. 30 tablet 0   No current facility-administered medications for this visit.   Facility-Administered Medications Ordered in Other Visits  Medication Dose Route Frequency Provider Last Rate Last Dose  . sodium chloride 0.9 % injection 10 mL  10 mL Intracatheter PRN [name], MD   10 mL at 02/19/16 1308    PHYSICAL EXAMINATION: ECOG PERFORMANCE STATUS: 1 - Symptomatic but completely ambulatory  Filed Vitals:   02/19/16 0916  BP: 114/74  Pulse: 90  Temp: 98.6 F (37 C)  Resp: 18   Filed Weights   02/19/16 0916  Weight: 155 lb 11.2 oz (70.625 kg)    GENERAL:alert, no distress and comfortable SKIN: skin color, texture, turgor are normal, no rashes or significant lesions. Prior skin infection on his face has healed EYES: normal, Conjunctiva are pink and non-injected, sclera clear OROPHARYNX:no exudate, no erythema and lips, buccal mucosa, and tongue normal  NECK: He has persistent tracheomstomy semi-in situ  LYMPH:  no palpable lymphadenopathy in the cervical, axillary or inguinal LUNGS: clear to auscultation and percussion with normal breathing effort HEART: regular rate & rhythm  and no murmurs and no lower extremity edema ABDOMEN:abdomen soft, non-tender and normal bowel sounds. Feeding tube site looks okay Musculoskeletal:no cyanosis of digits and no clubbing  NEURO: alert & oriented x 3 with fluent speech, no focal motor/sensory deficits  LABORATORY DATA:  I have reviewed the data as listed    Component Value Date/Time   NA 141 02/19/2016 0857   NA 140 01/02/2016 1433   K 3.8 02/19/2016 0857   K 4.3 01/02/2016 1433   CL 99* 01/02/2016 1433   CO2 28 02/19/2016 0857   CO2 29 01/02/2016 1422   GLUCOSE 85 02/19/2016 0857   GLUCOSE 117* 01/02/2016 1433   BUN 10.4 02/19/2016 0857   BUN 17 01/02/2016 1433   CREATININE 0.7 02/19/2016 0857    CREATININE 0.80 01/02/2016 1433   CALCIUM 9.1 02/19/2016 0857   CALCIUM 9.8 01/02/2016 1422   PROT 7.0 02/19/2016 0857   PROT 7.4 01/02/2016 1422   ALBUMIN 3.6 02/19/2016 0857   ALBUMIN 3.9 01/02/2016 1422   AST 14 02/19/2016 0857   AST 28 01/02/2016 1422   ALT <9 02/19/2016 0857   ALT 15* 01/02/2016 1422   ALKPHOS 58 02/19/2016 0857   ALKPHOS 56 01/02/2016 1422   BILITOT 0.34 02/19/2016 0857   BILITOT 0.8 01/02/2016 1422   GFRNONAA >60 01/02/2016 1422   GFRAA >60 01/02/2016 1422    No results found for: SPEP, UPEP  Lab Results  Component Value Date   WBC 3.9* 02/19/2016   NEUTROABS 2.7 02/19/2016   HGB 10.5* 02/19/2016   HCT 31.1* 02/19/2016   MCV 117.5* 02/19/2016   PLT 157 02/19/2016      Chemistry      Component Value Date/Time   NA 141 02/19/2016 0857   NA 140 01/02/2016 1433   K 3.8 02/19/2016 0857   K 4.3 01/02/2016 1433   CL 99* 01/02/2016 1433   CO2 28 02/19/2016 0857   CO2 29 01/02/2016 1422   BUN 10.4 02/19/2016 0857   BUN 17 01/02/2016 1433   CREATININE 0.7 02/19/2016 0857   CREATININE 0.80 01/02/2016 1433      Component Value Date/Time   CALCIUM 9.1 02/19/2016 0857   CALCIUM 9.8 01/02/2016 1422   ALKPHOS 58 02/19/2016 0857   ALKPHOS 56 01/02/2016 1422   AST 14 02/19/2016 0857   AST 28 01/02/2016 1422   ALT <9 02/19/2016 0857   ALT 15* 01/02/2016 1422   BILITOT 0.34 02/19/2016 0857   BILITOT 0.8 01/02/2016 1422      ASSESSMENT & PLAN:  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Kyle Alexander) He tolerated treatment poorly with significant nausea, vomiting and pancytopenia despite mild dose adjustment. I plan to adjust premedications by adding amend and to continue his chemotherapy with minor dose adjustment  The patient wants to delay treatment due to anticipated surgery on 02/25/2016 and a wedding that he plans a 10 on 02/20/2016 He will get cetuximab only today and I will delay cycle 5 to start on 02/29/16 I plan to repeat another PET CT scan after  he completed 6 cycles of chemotherapy  Pancytopenia due to antineoplastic chemotherapy [facility]) This is likely due to recent treatment.  As above, I plan to reduce dose of treatment in the future No need transfusion needed  Tobacco abuse His currently using a nicotine patch. I continue to encourage him to quit smoking.   Chemotherapy-induced nausea This is quite severe with last cycle of treatment. I recommended adding Emend  Cancer associated pain He has chronic  pain in his throat related to cancer. He has planned surgery next week and for further ENT evaluation. I refill his prescription of pain medicine and reinforced narcotic refill policy   No orders of the defined types were placed in this encounter.   All questions were answered. The patient knows to call the clinic with any problems, questions or concerns. No barriers to learning was detected. I spent 25 minutes counseling the patient face to face. The total time spent in the appointment was 30 minutes and more than 50% was on counseling and review of test results     [facility], Bassel Gaskill, MD 02/19/2016 1:51 PM

## 2016-02-19 NOTE — Telephone Encounter (Signed)
Per staff message and POF I have scheduled appts. Advised scheduler of appts. JMW  

## 2016-02-19 NOTE — Patient Instructions (Signed)

## 2016-02-22 MED FILL — OMEPRAZOLE DR 20 MG CAPSULE: 20 | 30 days supply | Qty: 60 | Fill #1

## 2016-02-22 MED FILL — LEVOTHYROXINE 25 MCG TABLET: 25 | 30 days supply | Qty: 30 | Fill #1

## 2016-02-22 MED FILL — oxyCODONE HCL 5 MG/5ML SOLN: 5 | 23 days supply | Qty: 473 | Fill #0

## 2016-02-23 ENCOUNTER — Other Ambulatory Visit (HOSPITAL_COMMUNITY): Payer: Self-pay | Admitting: Otolaryngology

## 2016-02-23 ENCOUNTER — Encounter (HOSPITAL_BASED_OUTPATIENT_CLINIC_OR_DEPARTMENT_OTHER): Payer: Self-pay | Admitting: *Deleted

## 2016-02-23 NOTE — H&P (Signed)
Kyle Alexander, Kyle Alexander 55 y.o., male [id number]     Chief Complaint: progressive  Swallowing difficulty  HPI:    His primary issues today is worsening swallowing. When he tries to swallow, he has nasopharyngeal regurgitation. This is worse than last time. He claims he is able to get medications down in pill form by mouth. Kyle Alexander is a 55 y.o. male patient here for follow up laryngeal/pharyngeal cancer. One month return visit. He has had no further facial or neck swelling. The groin node/abscess has resolved. He completed radiation last July, but is still getting interval chemotherapy. He was having trouble swallowing pills last time, but has neglected to get a pill crusher to use. The trach stoma is   PMH: Past Medical History  Diagnosis Date  . Hypertension   . Shortness of breath dyspnea   . GERD (gastroesophageal reflux disease)   . Seizures ([place])   . S/P radiation therapy 04/16/2015-05/18/2015    Laryngopharynx and bilateral neck / 50 Gy in 20 fractions to gross disease, 45 Gy in 20 fractions to high risk nodal echelons   . Heartburn symptom 10/19/2015  . Cancer ([place]) 03/16/15    left cervical lymph node / throat (2016)  . Nausea & vomiting 12/15/2015  . Insomnia 01/22/2016    Surg Hx: Past Surgical History  Procedure Laterality Date  . Knee surgery  1998    Left  . Tonsillectomy and adenoidectomy    . Lymph node biopsy Left 03/16/15    left cervical, consistent with squamous cell carcinoma  . Hernia repair      right groin  . Tonsillectomy    . Tracheostomy tube placement N/A 03/26/2015    Procedure: TRACHEOSTOMY ;  Surgeon: [name], MD;  Location: [address];  Service: ENT;  Laterality: N/A;  . Tooth extraction  03/26/2015    Procedure: [place];  Surgeon: [name], MD;  Location: [place];  Service: ENT;;  . Multiple extractions with alveoloplasty N/A 03/26/2015    Procedure: EXTRACTION OF TOOTH #'S 1,2,5,6,7,8,9,10,11,12,13,18,19,21,22,23,24,25,26,27,28,29  WITH AVELOPLASTY;   Surgeon: Lenn Cal, DDS;  Location: [place];  Service: Oral Surgery;  Laterality: N/A;  . Gastrostomy N/A 03/31/2015    Procedure: OPEN GASTROSTOMY TUBE PLACEMENT;  Surgeon: Rolm Bookbinder, MD;  [facility];  Service: General;  Laterality: N/A;    FHx:   Family History  Problem Relation Age of Onset  . Cancer Mother   . Diabetes Mother   . Diabetes Father   . Hypertension Father    SocHx:  reports that he has been smoking Cigarettes.  He has been smoking about 0.00 packs per day for the past 35 years. He has quit using smokeless tobacco. His smokeless tobacco use included Chew. He reports that he drinks about 18.0 oz of alcohol per week. He reports that he uses illicit drugs (Marijuana).  ALLERGIES:  Allergies  Allergen Reactions  . Aspirin     Makes me bleed, says this happened when he was a child     (Not in a hospital admission)  No results found for this or any previous visit (from the past 48 hour(s)). No results found.   There were no vitals taken for this visit.  PHYSICAL EXAM: Examination: He appears stronger. His voice is definitely clear. He is breathing comfortably with a Passy-Muir valve in place. Ears are clear. Anterior no shows a leftward septal deviation. Oral cavity is edentulous and moist. Oropharynx is clear. Neck with some radiation fibrosis. No masses. Tracheostomy  tube is clean and secure.   Lungs: Clear to auscultation Heart: Regular rate and rhythm without murmurs Abdomen: Soft, active Extremities: Normal configuration Neurologic: Symmetric, grossly intact.  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   Plan: We will try to do a barium swallow. I suspect this will be incomplete on the basis of aspiration. If so, we will proceed with direct laryngoscopy, esophagoscopy with dilatation, biopsy of necessary, and probable  cricopharyngeus Botox injection. I discussed this with him in detail including risks and complications. Questions were answered and informed consent was obtained. He declines any additional pain medication. I will see him here one month after his surgery.  [name] [id number], 10:51 AM

## 2016-02-26 ENCOUNTER — Ambulatory Visit (HOSPITAL_BASED_OUTPATIENT_CLINIC_OR_DEPARTMENT_OTHER)
Admission: RE | Admit: 2016-02-26 | Discharge: 2016-02-26 | Disposition: A | Discharge status: Home / Self Care | Payer: Medicaid Other | Source: Ambulatory Visit | Attending: Otolaryngology | Admitting: Otolaryngology

## 2016-02-26 ENCOUNTER — Other Ambulatory Visit: Payer: Self-pay

## 2016-02-26 ENCOUNTER — Encounter (HOSPITAL_BASED_OUTPATIENT_CLINIC_OR_DEPARTMENT_OTHER)
Admission: RE | Disposition: A | Discharge status: Home / Self Care | Payer: Self-pay | Source: Ambulatory Visit | Attending: Otolaryngology

## 2016-02-26 ENCOUNTER — Ambulatory Visit (HOSPITAL_BASED_OUTPATIENT_CLINIC_OR_DEPARTMENT_OTHER): Payer: Medicaid Other | Admitting: Anesthesiology

## 2016-02-26 ENCOUNTER — Ambulatory Visit: Payer: Self-pay

## 2016-02-26 ENCOUNTER — Encounter (HOSPITAL_BASED_OUTPATIENT_CLINIC_OR_DEPARTMENT_OTHER): Payer: Self-pay | Admitting: Anesthesiology

## 2016-02-26 DIAGNOSIS — Z931 Gastrostomy status: Secondary | ICD-10-CM | POA: Diagnosis not present

## 2016-02-26 DIAGNOSIS — Z930 Tracheostomy status: Secondary | ICD-10-CM | POA: Insufficient documentation

## 2016-02-26 DIAGNOSIS — C140 Malignant neoplasm of pharynx, unspecified: Secondary | ICD-10-CM | POA: Diagnosis not present

## 2016-02-26 DIAGNOSIS — F17210 Nicotine dependence, cigarettes, uncomplicated: Secondary | ICD-10-CM | POA: Insufficient documentation

## 2016-02-26 DIAGNOSIS — Z6821 Body mass index (BMI) 21.0-21.9, adult: Secondary | ICD-10-CM | POA: Insufficient documentation

## 2016-02-26 DIAGNOSIS — K219 Gastro-esophageal reflux disease without esophagitis: Secondary | ICD-10-CM | POA: Insufficient documentation

## 2016-02-26 DIAGNOSIS — I10 Essential (primary) hypertension: Secondary | ICD-10-CM | POA: Diagnosis not present

## 2016-02-26 DIAGNOSIS — Z8521 Personal history of malignant neoplasm of larynx: Secondary | ICD-10-CM | POA: Diagnosis not present

## 2016-02-26 DIAGNOSIS — E43 Unspecified severe protein-calorie malnutrition: Secondary | ICD-10-CM | POA: Diagnosis not present

## 2016-02-26 DIAGNOSIS — Z923 Personal history of irradiation: Secondary | ICD-10-CM | POA: Diagnosis not present

## 2016-02-26 DIAGNOSIS — Z79899 Other long term (current) drug therapy: Secondary | ICD-10-CM | POA: Diagnosis not present

## 2016-02-26 DIAGNOSIS — R1310 Dysphagia, unspecified: Secondary | ICD-10-CM | POA: Insufficient documentation

## 2016-02-26 HISTORY — PX: DIRECT LARYNGOSCOPY WITH BOTOX INJECTION: SHX5327

## 2016-02-26 HISTORY — PX: ESOPHAGOSCOPY WITH DILITATION: SHX5618

## 2016-02-26 SURGERY — LARYNGOSCOPY, DIRECT, WITH BOTULINUM TOXIN INJECTION
Anesthesia: General | Site: Throat

## 2016-02-26 MED ORDER — METOCLOPRAMIDE HCL 5 MG/ML IJ SOLN: 10.0000 mg | Freq: Once | INTRAMUSCULAR | Status: DC | PRN

## 2016-02-26 MED ORDER — OXYCODONE HCL 5 MG/5ML PO SOLN: 5.0000 mg | ORAL | Status: DC | PRN

## 2016-02-26 MED ORDER — MIDAZOLAM HCL 2 MG/2ML IJ SOLN
1.0000 mg | INTRAMUSCULAR | Status: DC | PRN
  Administered 2016-02-26: 2 mg via INTRAVENOUS

## 2016-02-26 MED ORDER — LIDOCAINE HCL (CARDIAC) 20 MG/ML IV SOLN
INTRAVENOUS | Status: AC
  Filled 2016-02-26: qty 5

## 2016-02-26 MED ORDER — SODIUM CHLORIDE 0.9 % IJ SOLN
INTRAMUSCULAR | Status: AC
  Filled 2016-02-26: qty 10

## 2016-02-26 MED ORDER — IBUPROFEN 100 MG/5ML PO SUSP: 400.0000 mg | Freq: Four times a day (QID) | ORAL | Status: DC | PRN

## 2016-02-26 MED ORDER — FENTANYL CITRATE (PF) 100 MCG/2ML IJ SOLN
50.0000 ug | INTRAMUSCULAR | Status: DC | PRN
  Administered 2016-02-26: 100 ug via INTRAVENOUS

## 2016-02-26 MED ORDER — DEXAMETHASONE SODIUM PHOSPHATE 10 MG/ML IJ SOLN
INTRAMUSCULAR | Status: AC
  Filled 2016-02-26: qty 1

## 2016-02-26 MED ORDER — SCOPOLAMINE 1 MG/3DAYS TD PT72: 1.0000 | MEDICATED_PATCH | Freq: Once | TRANSDERMAL | Status: DC | PRN

## 2016-02-26 MED ORDER — GLYCOPYRROLATE 0.2 MG/ML IJ SOLN: 0.2000 mg | Freq: Once | INTRAMUSCULAR | Status: DC | PRN

## 2016-02-26 MED ORDER — FENTANYL CITRATE (PF) 100 MCG/2ML IJ SOLN: 25.0000 ug | INTRAMUSCULAR | Status: DC | PRN

## 2016-02-26 MED ORDER — DEXTROSE-NACL 5-0.45 % IV SOLN: INTRAVENOUS | Status: DC

## 2016-02-26 MED ORDER — MEPERIDINE HCL 25 MG/ML IJ SOLN: 6.2500 mg | INTRAMUSCULAR | Status: DC | PRN

## 2016-02-26 MED ORDER — FENTANYL CITRATE (PF) 100 MCG/2ML IJ SOLN
INTRAMUSCULAR | Status: AC
Start: 2016-02-26 — End: 2016-02-26
  Filled 2016-02-26: qty 2

## 2016-02-26 MED ORDER — LACTATED RINGERS IV SOLN
INTRAVENOUS | Status: DC
  Administered 2016-02-26: 08:00:00 via INTRAVENOUS

## 2016-02-26 MED ORDER — PROPOFOL 10 MG/ML IV BOLUS
INTRAVENOUS | Status: DC | PRN
  Administered 2016-02-26: 150 mg via INTRAVENOUS
  Administered 2016-02-26: 50 mg via INTRAVENOUS

## 2016-02-26 MED ORDER — LIDOCAINE HCL (CARDIAC) 10 MG/ML IV SOLN
INTRAVENOUS | Status: DC | PRN
  Administered 2016-02-26: 60 mg via INTRAVENOUS

## 2016-02-26 MED ORDER — MIDAZOLAM HCL 2 MG/2ML IJ SOLN
INTRAMUSCULAR | Status: AC
  Filled 2016-02-26: qty 2

## 2016-02-26 MED ORDER — SILVER NITRATE-POT NITRATE 75-25 % EX MISC
CUTANEOUS | Status: AC
  Filled 2016-02-26: qty 1

## 2016-02-26 MED ORDER — PROPOFOL 10 MG/ML IV BOLUS
INTRAVENOUS | Status: AC
  Filled 2016-02-26: qty 20

## 2016-02-26 MED ORDER — ONDANSETRON HCL 4 MG/2ML IJ SOLN
INTRAMUSCULAR | Status: AC
  Filled 2016-02-26: qty 2

## 2016-02-26 MED ORDER — EPINEPHRINE HCL 1 MG/ML IJ SOLN
INTRAMUSCULAR | Status: AC
  Filled 2016-02-26: qty 1

## 2016-02-26 SURGICAL SUPPLY — 25 items
CANISTER SUCT 1200ML W/VALVE (MISCELLANEOUS) ×4 IMPLANT
CONT SPEC 4OZ CLIKSEAL STRL BL (MISCELLANEOUS) IMPLANT
GLOVE ECLIPSE 8.0 STRL XLNG CF (GLOVE) ×4 IMPLANT
GOWN STRL REUS W/ TWL LRG LVL3 (GOWN DISPOSABLE) IMPLANT
GOWN STRL REUS W/ TWL XL LVL3 (GOWN DISPOSABLE) IMPLANT
GOWN STRL REUS W/TWL LRG LVL3 (GOWN DISPOSABLE)
GOWN STRL REUS W/TWL XL LVL3 (GOWN DISPOSABLE)
GUARD TEETH (MISCELLANEOUS) IMPLANT
NDL SAFETY ECLIPSE 18X1.5 (NEEDLE) IMPLANT
NEEDLE HYPO 18GX1.5 SHARP (NEEDLE)
NEEDLE SPNL 22GX7 QUINCKE BK (NEEDLE) IMPLANT
NS IRRIG 1000ML POUR BTL (IV SOLUTION) ×4 IMPLANT
PATTIES SURGICAL .5 X3 (DISPOSABLE) IMPLANT
SHEET MEDIUM DRAPE 40X70 STRL (DRAPES) ×4 IMPLANT
SLEEVE SCD COMPRESS KNEE MED (MISCELLANEOUS) IMPLANT
SPONGE GAUZE 4X4 12PLY STER LF (GAUZE/BANDAGES/DRESSINGS) ×8 IMPLANT
SURGILUBE 2OZ TUBE FLIPTOP (MISCELLANEOUS) ×8 IMPLANT
SYR 5ML LL (SYRINGE) IMPLANT
SYR CONTROL 10ML LL (SYRINGE) IMPLANT
SYR TB 1ML LL NO SAFETY (SYRINGE) IMPLANT
TOWEL OR 17X24 6PK STRL BLUE (TOWEL DISPOSABLE) ×4 IMPLANT
TRAP SPECIMEN MUCOUS 40CC (MISCELLANEOUS) IMPLANT
TUBE CONNECTING 20'X1/4 (TUBING) ×1
TUBE CONNECTING 20X1/4 (TUBING) ×3 IMPLANT
YANKAUER SUCT BULB TIP NO VENT (SUCTIONS) IMPLANT

## 2016-02-26 NOTE — Anesthesia Postprocedure Evaluation (Signed)
Anesthesia Post Note  Patient: SABIN MOLTON  Procedure(s) Performed: Procedure(s) (LRB): DIRECT LARYNGOSCOPY ESOPHAGOSCOPY DILATION  AND TRACH REPLACMENT (N/A) ESOPHAGOSCOPY WITH DILITATION, PHARNGEAL BIOPSY (N/A)  Patient location during evaluation: PACU Anesthesia Type: General Level of consciousness: awake and alert and oriented Pain management: pain level controlled Vital Signs Assessment: post-procedure vital signs reviewed and stable Respiratory status: spontaneous breathing, nonlabored ventilation and respiratory function stable Cardiovascular status: blood pressure returned to baseline and stable Postop Assessment: no signs of nausea or vomiting Anesthetic complications: no    Last Vitals:  Filed Vitals:   02/26/16 1000 02/26/16 1015  BP: 127/81   Pulse: 82 82  Temp:    Resp: 13 16    Last Pain:  Filed Vitals:   02/26/16 1017  PainSc: 2                  Evangelos Paulino A.

## 2016-02-26 NOTE — Discharge Instructions (Signed)
Continue with po and per tube diet as before. Recheck my office 2 weeks please.  [phone] for an appointment The plastic nub on the [facility] retaining strap came off.  We may need to get you another valve at some point.   Post Anesthesia Home Care Instructions  Activity: Get plenty of rest for the remainder of the day. A responsible adult should stay with you for 24 hours following the procedure.  For the next 24 hours, DO NOT: -Drive a car -Paediatric nurse -Drink alcoholic beverages -Take any medication unless instructed by your physician -Make any legal decisions or sign important papers.  Meals: Start with liquid foods such as gelatin or soup. Progress to regular foods as tolerated. Avoid greasy, spicy, heavy foods. If nausea and/or vomiting occur, drink only clear liquids until the nausea and/or vomiting subsides. Call your physician if vomiting continues.  Special Instructions/Symptoms: Your throat may feel dry or sore from the anesthesia or the breathing tube placed in your throat during surgery. If this causes discomfort, gargle with warm salt water. The discomfort should disappear within 24 hours.  If you had a scopolamine patch placed behind your ear for the management of post- operative nausea and/or vomiting:  1. The medication in the patch is effective for 72 hours, after which it should be removed.  Wrap patch in a tissue and discard in the trash. Wash hands thoroughly with soap and water. 2. You may remove the patch earlier than 72 hours if you experience unpleasant side effects which may include dry mouth, dizziness or visual disturbances. 3. Avoid touching the patch. Wash your hands with soap and water after contact with the patch.

## 2016-02-26 NOTE — Anesthesia Preprocedure Evaluation (Addendum)
Anesthesia Evaluation  Patient identified by MRN, date of birth, ID band Patient awake    Reviewed: Allergy & Precautions, NPO status , Patient's Chart, lab work & pertinent test results  Airway Mallampati: Trach  TM Distance: >3 FB Neck ROM: Full    Dental  (+) Edentulous Upper, Edentulous Lower   Pulmonary shortness of breath and at rest, Current Smoker,  Hx/o Squamous Cell Ca Supraglottic with mets S/P tracheostomy S/P RT and Chemo Rx   Pulmonary exam normal breath sounds clear to auscultation       Cardiovascular hypertension, + Peripheral Vascular Disease  Normal cardiovascular exam Rhythm:Regular Rate:Normal  Prolonged QT on EKG   Neuro/Psych Seizures -, Well Controlled,  ETOH withdrawl negative psych ROS   GI/Hepatic GERD  Medicated and Controlled,(+)     substance abuse  alcohol use and marijuana use, Dysphyagia Severe Protein Calorie Malnutrition PEG tube   Endo/Other  negative endocrine ROS  Renal/GU negative Renal ROS  negative genitourinary   Musculoskeletal negative musculoskeletal ROS (+)   Abdominal (+) + scaphoid   Peds  Hematology  (+) anemia ,   Anesthesia Other Findings   Reproductive/Obstetrics                           Anesthesia Physical Anesthesia Plan  ASA: III  Anesthesia Plan: General   Post-op Pain Management:    Induction: Intravenous  Airway Management Planned: Tracheostomy  Additional Equipment:   Intra-op Plan:   Post-operative Plan:   Informed Consent: I have reviewed the patients History and Physical, chart, labs and discussed the procedure including the risks, benefits and alternatives for the proposed anesthesia with the patient or authorized representative who has indicated his/her understanding and acceptance.     Plan Discussed with: Anesthesiologist, CRNA and Surgeon  Anesthesia Plan Comments:         Anesthesia Quick  Evaluation

## 2016-02-26 NOTE — H&P (View-Only) (Signed)
Kyle Alexander, Kyle Alexander 55 y.o., male [id number]     Chief Complaint: progressive  Swallowing difficulty  HPI:    His primary issues today is worsening swallowing. When he tries to swallow, he has nasopharyngeal regurgitation. This is worse than last time. He claims he is able to get medications down in pill form by mouth. Kyle Alexander is a 55 y.o. male patient here for follow up laryngeal/pharyngeal cancer. One month return visit. He has had no further facial or neck swelling. The groin node/abscess has resolved. He completed radiation last July, but is still getting interval chemotherapy. He was having trouble swallowing pills last time, but has neglected to get a pill crusher to use. The trach stoma is   PMH: Past Medical History  Diagnosis Date  . Hypertension   . Shortness of breath dyspnea   . GERD (gastroesophageal reflux disease)   . Seizures ([place])   . S/P radiation therapy 04/16/2015-05/18/2015    Laryngopharynx and bilateral neck / 50 Gy in 20 fractions to gross disease, 45 Gy in 20 fractions to high risk nodal echelons   . Heartburn symptom 10/19/2015  . Cancer ([place]) 03/16/15    left cervical lymph node / throat (2016)  . Nausea & vomiting 12/15/2015  . Insomnia 01/22/2016    Surg Hx: Past Surgical History  Procedure Laterality Date  . Knee surgery  1998    Left  . Tonsillectomy and adenoidectomy    . Lymph node biopsy Left 03/16/15    left cervical, consistent with squamous cell carcinoma  . Hernia repair      right groin  . Tonsillectomy    . Tracheostomy tube placement N/A 03/26/2015    Procedure: TRACHEOSTOMY ;  Surgeon: [name], MD;  Location: [place];  Service: ENT;  Laterality: N/A;  . Tooth extraction  03/26/2015    Procedure: [place];  Surgeon: [name], MD;  Location: [place];  Service: ENT;;  . Multiple extractions with alveoloplasty N/A 03/26/2015    Procedure: EXTRACTION OF TOOTH #'S 1,2,5,6,7,8,9,10,11,12,13,18,19,21,22,23,24,25,26,27,28,29  WITH AVELOPLASTY;   Surgeon: Lenn Cal, DDS;  Location: [place];  Service: Oral Surgery;  Laterality: N/A;  . Gastrostomy N/A 03/31/2015    Procedure: OPEN GASTROSTOMY TUBE PLACEMENT;  Surgeon: Rolm Bookbinder, MD;  [facility];  Service: General;  Laterality: N/A;    FHx:   Family History  Problem Relation Age of Onset  . Cancer Mother   . Diabetes Mother   . Diabetes Father   . Hypertension Father    SocHx:  reports that he has been smoking Cigarettes.  He has been smoking about 0.00 packs per day for the past 35 years. He has quit using smokeless tobacco. His smokeless tobacco use included Chew. He reports that he drinks about 18.0 oz of alcohol per week. He reports that he uses illicit drugs (Marijuana).  ALLERGIES:  Allergies  Allergen Reactions  . Aspirin     Makes me bleed, says this happened when he was a child     (Not in a hospital admission)  No results found for this or any previous visit (from the past 48 hour(s)). No results found.   There were no vitals taken for this visit.  PHYSICAL EXAM: Examination: He appears stronger. His voice is definitely clear. He is breathing comfortably with a Passy-Muir valve in place. Ears are clear. Anterior no shows a leftward septal deviation. Oral cavity is edentulous and moist. Oropharynx is clear. Neck with some radiation fibrosis. No masses. Tracheostomy  tube is clean and secure.   Lungs: Clear to auscultation Heart: Regular rate and rhythm without murmurs Abdomen: Soft, active Extremities: Normal configuration Neurologic: Symmetric, grossly intact.  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   Plan: We will try to do a barium swallow. I suspect this will be incomplete on the basis of aspiration. If so, we will proceed with direct laryngoscopy, esophagoscopy with dilatation, biopsy of necessary, and probable  cricopharyngeus Botox injection. I discussed this with him in detail including risks and complications. Questions were answered and informed consent was obtained. He declines any additional pain medication. I will see him here one month after his surgery.  [name] [id number], 10:51 AM

## 2016-02-26 NOTE — Transfer of Care (Signed)
Immediate Anesthesia Transfer of Care Note  Patient: Kyle Alexander  Procedure(s) Performed: Procedure(s): DIRECT LARYNGOSCOPY ESOPHAGOSCOPY DILATION  AND TRACH REPLACMENT (N/A) ESOPHAGOSCOPY WITH DILITATION, PHARNGEAL BIOPSY (N/A)  Patient Location: PACU  Anesthesia Type:General  Level of Consciousness: awake, sedated and patient cooperative  Airway & Oxygen Therapy: Patient Spontanous Breathing and Patient connected to tracheostomy mask oxygen  Post-op Assessment: Report given to RN and Post -op Vital signs reviewed and stable  Post vital signs: Reviewed and stable  Last Vitals:  Filed Vitals:   02/26/16 0809  BP: 126/81  Pulse: 79  Temp: 37 C  Resp: 20    Complications: No apparent anesthesia complications

## 2016-02-26 NOTE — Interval H&P Note (Signed)
History and Physical Interval Note:  02/26/2016 8:51 AM  Kyle Alexander  has presented today for surgery, with the diagnosis of DYSPHAGIA   The various methods of treatment have been discussed with the patient and family. After consideration of risks, benefits and other options for treatment, the patient has consented to  Procedure(s): DIRECT LARYNGOSCOPY ESOPHAGOSCOPY DILATION POSSIBLE BOTOX AND TRACH REPLACMENT (N/A) as a surgical intervention .  The patient's history has been re-reviewed, patient re-examined, no change in status, stable for surgery.  I have re-reviewed the patient's chart and labs.  Questions were answered to the patient's satisfaction.     [name]

## 2016-02-26 NOTE — Op Note (Signed)
02/26/2016  9:40 AM    Kyle Alexander  GR:[id number]   Pre-Op Dx:  History of pharyngeal and supraglottic cancer. Progressive dysphagia  Post-op Dx: Same  Proc: Direct laryngoscopy with biopsy. Esophageal dilatation.   Surg:  Tyson Alias MD  Anes:  G Tracheostomal  EBL:  min  Comp:  none  Findings:  Severe kyphosis and radiation edema of the entire neck and pharynx. Irregularity and fullness of the right lateral pharyngeal wall. Narrow but basically intact mucosa at the cervical esophageal introitus.  It dilated easily to 49 [place].  Procedure: With the patient in a comfortable supine position, intravenous anesthesia was begun. The cuffless Shiley tracheostomy tube was exchanged for a wire wound endotracheal tube and anesthesia was continued through this.  A surgical timeout was performed in the standard fashion.  The table was turned 90 the patient placed in a slight reverse Trendelenburg. The head was appropriately supported. A moist 4 x 4 was used to protect the upper alveolus.  The Dedo laryngoscope was introduced and inspection of the pharynx was performed. Owing to distortion and narrowing, this was too large. It was exchanged for a Hollinger laryngoscope. Supraglottic larynx was narrow but without signs of tumor. The vocal cords also show radiation edema but no tumor. The right lateral pharynx was distorted and firm without gross tumor visible. Working down the left pyriform sinus, the esophagus was visualized and dilated through the laryngoscope with a 16 French savory dilator. A 20 French dilator was also passed.  The laryngoscope was removed. The pharynx was palpated with the findings as described above. With finger direction, additional dilation was performed on the left pyriform sinus without difficulty ending with a 36 [place] dilator. The base of tongue appeared prominent with the laryngoscope but was completely soft to palpation.   The Hollinger scope was  reintroduced. Several biopsies were taken from firm irregularity of the right lateral pharyngeal wall.These were sent for permanent interpretation.  Botox was not felt indicated.  With the patient breathing spontaneously, the endotracheal tube was removed and a #4 cuffless Shiley tracheostomy tube was replaced in the stoma. From this point, the patient was further awakened, then transferred to recovery in stable condition.  Dispo:   PACU to home   Plan:  He will continue to take diet orally and by gastrostomy tube. We will await the biopsy reports.  If these are positive, that will not be good news for this patient. If the dilation today seemed effective, it may be something we can repeat.   Tyson Alias MD

## 2016-02-26 NOTE — Anesthesia Procedure Notes (Signed)
Procedure Name: Intubation Performed by: Lyndee Leo Pre-anesthesia Checklist: Patient identified, Emergency Drugs available, Suction available and Patient being monitored Patient Re-evaluated:Patient Re-evaluated prior to inductionOxygen Delivery Method: Circle System Utilized Preoxygenation: Pre-oxygenation with 100% oxygen Intubation Type: IV induction and Tracheostomy Ventilation: Mask ventilation without difficulty Tube type: Reinforced Tube size: 6.0 mm Number of attempts: 1 Airway Equipment and Method: Tracheostomy Placement Confirmation: positive ETCO2 and breath sounds checked- equal and bilateral Tube secured with: Tape Dental Injury: Teeth and Oropharynx as per pre-operative assessment  Comments: Presents with #4 Shiley fenistrated trach tube

## 2016-02-29 ENCOUNTER — Ambulatory Visit (HOSPITAL_BASED_OUTPATIENT_CLINIC_OR_DEPARTMENT_OTHER): Payer: Medicaid Other

## 2016-02-29 ENCOUNTER — Other Ambulatory Visit: Payer: Self-pay

## 2016-02-29 ENCOUNTER — Ambulatory Visit (HOSPITAL_BASED_OUTPATIENT_CLINIC_OR_DEPARTMENT_OTHER): Payer: Medicaid Other | Admitting: Hematology and Oncology

## 2016-02-29 ENCOUNTER — Telehealth: Payer: Self-pay | Admitting: Hematology and Oncology

## 2016-02-29 ENCOUNTER — Telehealth: Payer: Self-pay | Admitting: *Deleted

## 2016-02-29 ENCOUNTER — Ambulatory Visit (HOSPITAL_COMMUNITY)
Admission: RE | Admit: 2016-02-29 | Discharge: 2016-02-29 | Disposition: A | Discharge status: Home / Self Care | Payer: Medicaid Other | Source: Ambulatory Visit | Attending: Hematology and Oncology | Admitting: Hematology and Oncology

## 2016-02-29 ENCOUNTER — Other Ambulatory Visit (HOSPITAL_BASED_OUTPATIENT_CLINIC_OR_DEPARTMENT_OTHER): Payer: Medicaid Other

## 2016-02-29 ENCOUNTER — Encounter (HOSPITAL_BASED_OUTPATIENT_CLINIC_OR_DEPARTMENT_OTHER): Payer: Self-pay | Admitting: Otolaryngology

## 2016-02-29 ENCOUNTER — Ambulatory Visit: Payer: Medicaid Other

## 2016-02-29 ENCOUNTER — Other Ambulatory Visit: Payer: Self-pay | Admitting: Hematology and Oncology

## 2016-02-29 VITALS — BP 110/81 | HR 98 | Temp 98.6°F | Resp 18 | Ht 70.0 in | Wt 152.8 lb

## 2016-02-29 VITALS — BP 118/75 | HR 74 | Resp 20

## 2016-02-29 DIAGNOSIS — Z931 Gastrostomy status: Secondary | ICD-10-CM | POA: Diagnosis not present

## 2016-02-29 DIAGNOSIS — C321 Malignant neoplasm of supraglottis: Secondary | ICD-10-CM | POA: Insufficient documentation

## 2016-02-29 DIAGNOSIS — C770 Secondary and unspecified malignant neoplasm of lymph nodes of head, face and neck: Secondary | ICD-10-CM

## 2016-02-29 DIAGNOSIS — Z5112 Encounter for antineoplastic immunotherapy: Secondary | ICD-10-CM | POA: Diagnosis not present

## 2016-02-29 DIAGNOSIS — T451X5A Adverse effect of antineoplastic and immunosuppressive drugs, initial encounter: Secondary | ICD-10-CM

## 2016-02-29 DIAGNOSIS — Y833 Surgical operation with formation of external stoma as the cause of abnormal reaction of the patient, or of later complication, without mention of misadventure at the time of the procedure: Secondary | ICD-10-CM | POA: Insufficient documentation

## 2016-02-29 DIAGNOSIS — R1313 Dysphagia, pharyngeal phase: Secondary | ICD-10-CM

## 2016-02-29 DIAGNOSIS — D61810 Antineoplastic chemotherapy induced pancytopenia: Secondary | ICD-10-CM | POA: Diagnosis not present

## 2016-02-29 DIAGNOSIS — E038 Other specified hypothyroidism: Secondary | ICD-10-CM

## 2016-02-29 DIAGNOSIS — E43 Unspecified severe protein-calorie malnutrition: Secondary | ICD-10-CM

## 2016-02-29 DIAGNOSIS — K9423 Gastrostomy malfunction: Secondary | ICD-10-CM | POA: Insufficient documentation

## 2016-02-29 DIAGNOSIS — Z95828 Presence of other vascular implants and grafts: Secondary | ICD-10-CM

## 2016-02-29 LAB — CBC WITH DIFFERENTIAL/PLATELET
BASO%: 0.1 % (ref 0.0–2.0)
BASOS ABS: 0 10*3/uL (ref 0.0–0.1)
EOS ABS: 0.1 10*3/uL (ref 0.0–0.5)
EOS%: 1.7 % (ref 0.0–7.0)
HEMATOCRIT: 33.9 % — AB (ref 38.4–49.9)
HEMOGLOBIN: 11.5 g/dL — AB (ref 13.0–17.1)
LYMPH#: 0.6 10*3/uL — AB (ref 0.9–3.3)
LYMPH%: 8.3 % — ABNORMAL LOW (ref 14.0–49.0)
MCH: 39.8 pg — AB (ref 27.2–33.4)
MCHC: 33.9 g/dL (ref 32.0–36.0)
MCV: 117.3 fL — AB (ref 79.3–98.0)
MONO#: 0.8 10*3/uL (ref 0.1–0.9)
MONO%: 10 % (ref 0.0–14.0)
NEUT%: 79.9 % — ABNORMAL HIGH (ref 39.0–75.0)
NEUTROS ABS: 6.2 10*3/uL (ref 1.5–6.5)
Platelets: 130 10*3/uL — ABNORMAL LOW (ref 140–400)
RBC: 2.89 10*6/uL — ABNORMAL LOW (ref 4.20–5.82)
RDW: 14 % (ref 11.0–14.6)
WBC: 7.7 10*3/uL (ref 4.0–10.3)

## 2016-02-29 LAB — COMPREHENSIVE METABOLIC PANEL
ALBUMIN: 4 g/dL (ref 3.5–5.0)
ALK PHOS: 56 U/L (ref 40–150)
ALT: 10 U/L (ref 0–55)
ANION GAP: 10 meq/L (ref 3–11)
AST: 14 U/L (ref 5–34)
BILIRUBIN TOTAL: 0.57 mg/dL (ref 0.20–1.20)
BUN: 14.2 mg/dL (ref 7.0–26.0)
CO2: 28 meq/L (ref 22–29)
CREATININE: 0.8 mg/dL (ref 0.7–1.3)
Calcium: 9.8 mg/dL (ref 8.4–10.4)
Chloride: 103 mEq/L (ref 98–109)
GLUCOSE: 97 mg/dL (ref 70–140)
Potassium: 3.4 mEq/L — ABNORMAL LOW (ref 3.5–5.1)
SODIUM: 141 meq/L (ref 136–145)
TOTAL PROTEIN: 7.7 g/dL (ref 6.4–8.3)

## 2016-02-29 LAB — MAGNESIUM: MAGNESIUM: 2 mg/dL (ref 1.5–2.5)

## 2016-02-29 MED ORDER — SODIUM CHLORIDE 0.9 % IV SOLN
Freq: Once | INTRAVENOUS | Status: AC
  Administered 2016-02-29: 09:00:00 via INTRAVENOUS

## 2016-02-29 MED ORDER — HEPARIN SOD (PORK) LOCK FLUSH 100 UNIT/ML IV SOLN
500.0000 [IU] | Freq: Once | INTRAVENOUS | Status: AC | PRN
  Administered 2016-02-29: 500 [IU]
  Filled 2016-02-29: qty 5

## 2016-02-29 MED ORDER — DIPHENHYDRAMINE HCL 50 MG/ML IJ SOLN
50.0000 mg | Freq: Once | INTRAMUSCULAR | Status: AC
  Administered 2016-02-29: 50 mg via INTRAVENOUS

## 2016-02-29 MED ORDER — SODIUM CHLORIDE 0.9 % IV SOLN
Freq: Once | INTRAVENOUS | Status: DC
Start: 2016-02-29 — End: 2016-02-29

## 2016-02-29 MED ORDER — HYDROMORPHONE HCL 4 MG/ML IJ SOLN
2.0000 mg | INTRAMUSCULAR | Status: DC | PRN
Start: 2016-02-29 — End: 2016-02-29
  Administered 2016-02-29: 2 mg via INTRAVENOUS

## 2016-02-29 MED ORDER — CETUXIMAB CHEMO IV INJECTION 200 MG/100ML
250.0000 mg/m2 | Freq: Once | INTRAVENOUS | Status: AC
  Administered 2016-02-29: 500 mg via INTRAVENOUS
  Filled 2016-02-29: qty 250

## 2016-02-29 MED ORDER — ONDANSETRON HCL 40 MG/20ML IJ SOLN
Freq: Once | INTRAMUSCULAR | Status: AC
  Administered 2016-02-29: 10:00:00 via INTRAVENOUS
  Filled 2016-02-29: qty 4

## 2016-02-29 MED ORDER — SODIUM CHLORIDE 0.9 % IJ SOLN
10.0000 mL | INTRAMUSCULAR | Status: DC | PRN
Start: 2016-02-29 — End: 2016-02-29
  Administered 2016-02-29: 10 mL
  Filled 2016-02-29: qty 10

## 2016-02-29 MED ORDER — DIPHENHYDRAMINE HCL 50 MG/ML IJ SOLN
INTRAMUSCULAR | Status: AC
  Filled 2016-02-29: qty 1

## 2016-02-29 MED ORDER — SODIUM CHLORIDE 0.9% FLUSH
10.0000 mL | INTRAVENOUS | Status: DC | PRN
  Administered 2016-02-29: 10 mL via INTRAVENOUS
  Filled 2016-02-29: qty 10

## 2016-02-29 MED ORDER — HYDROMORPHONE HCL 4 MG/ML IJ SOLN
INTRAMUSCULAR | Status: AC
  Filled 2016-02-29: qty 1

## 2016-02-29 NOTE — Telephone Encounter (Signed)
per popf to sch pt appt-sent MW emailt o sch trmt -pt to get updated copy of avs

## 2016-02-29 NOTE — Progress Notes (Signed)
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[place])   03/15/2015 - 03/17/2015 Hospital Admission He was admitted to the hospital for evaluation of dysphagia, SOB, hemoptosis, hoarseness, 30-40 pound weight loss and worsening bilateral neck masses for 5 months   03/15/2015 Imaging Ct showed extensive circumferential malignancy in the hypopharyngeal/supraglottic region with regional LN metastases   03/16/2015 Procedure He underwent ULTRASOUND-GUIDED BIOPSY OF LEFT CERVICAL LYMPH NODES   03/16/2015 Pathology Results Accession: [id number] LN biopsy showed invasive squamous cell cancer   03/16/2015 Pathology Results Accession: [id number] showed atypical squamous cells   03/25/2015 - 04/07/2015 Hospital Admission He was admitted to the hospital and underwent tracheostomy placement, feeeding tube placement but subsequently left [facility]   03/26/2015 Surgery He had multiple extraction of tooth numbers 1, 2, 5, 6, 7, 8, 9, 10, 11, 12, 13, 18, 19, 21, 22, 23, 24, 25, 26, 27, 28, and 29. and 4 Quadrants of alveoloplasty   03/26/2015 Surgery He underwent tracheostomy   03/31/2015 Surgery He had open gastrostomy tube placement by Dr. Donne Hazel   04/16/2015 - 05/18/2015 Radiation Therapy Laryngopharynx and bilateral neck / 50 Gy in 20 fractions to gross disease, 45 Gy in 20 fractions to high risk nodal echelons  Beams/energy: Helical IMRT / 6 MV photons   04/16/2015 Procedure Fluoroscopic reposition of the 18 French gastrostomy confirmed back in the stomach,   07/03/2015 Procedure IR performed replacement of gastrostomy tube with a new 72 French balloon retention tube   10/01/2015 Imaging PEt  scan showed persistent hypermetabolism within the primary supraglottic laryngeal tumor and within right retropharyngeal, bilateral level II and left level IV cervical nodal metastases   10/28/2015 Procedure He had placement of PICC line. The IR was not able to place PORT due to suspected upper respiratory infection   11/13/2015 -  Chemotherapy He received 5FU, carboplatin chemo with weekly Erbitux   11/19/2015 Surgery Gastrostomy tube replaced.   11/30/2015 Procedure Placement of right jugular port-a-cath.   11/30/2015 Procedure PICC removed.   12/30/2015 Imaging PET CT showed positive response to Rx   02/26/2016 Procedure He underwent direct laryngoscopy with biopsy. Esophageal dilatation.    02/26/2016 Pathology Results Repeat biopsy of supraglottis showed persistent disease    INTERVAL HISTORY: Please see below for problem oriented charting. He returns today for further follow-up and prior to cycle 5 of treatment. He continues to have dysphagia. Denies recent nausea or vomiting. He complained of fatigue and requested cycle 5 chemotherapy to be delay again until next week. He complained of mild dehydration. He states that the feeding tube is not working well and that Is broken and requested interventional radiologist to take a look at it He has lost a bit of weight due to poor appetite  REVIEW OF SYSTEMS:   Constitutional: Denies fevers, chills Eyes: Denies blurriness of vision Ears, nose, mouth, throat, and face: Denies mucositis or sore throat Respiratory: Denies cough, dyspnea or wheezes Cardiovascular: Denies palpitation, chest discomfort or lower extremity swelling Gastrointestinal:  Denies nausea, heartburn or change in bowel habits Skin: Denies abnormal skin rashes Lymphatics: Denies new lymphadenopathy or easy bruising Neurological:Denies numbness, tingling or new weaknesses Behavioral/Psych: Mood is stable, no new changes  All other  systems were reviewed with the patient and are  negative.  I have reviewed the past medical history, past surgical history, social history and family history with the patient and they are unchanged from previous note.  ALLERGIES:  is allergic to aspirin.  MEDICATIONS:  Current Outpatient Prescriptions  Medication Sig Dispense Refill  . fluticasone (FLONASE) 50 MCG/ACT nasal spray Place 2 sprays into both nostrils daily. 16 g 2  . lansoprazole (PREVACID) 30 MG capsule Take 1 capsule (30 mg total) by mouth 2 (two) times daily before a meal. 60 capsule 6  . levothyroxine (LEVOTHROID) 25 MCG tablet Take 1 tablet (25 mcg total) by mouth daily before breakfast. 30 tablet 3  . lidocaine (XYLOCAINE) 2 % solution Use as directed 15 mLs in the mouth or throat every 8 (eight) hours as needed for mouth pain. 100 mL 0  . LORazepam (ATIVAN) 0.5 MG tablet Take 1-2 tablets before PET/ CT Scans. 6 tablet 0  . metoCLOPramide (REGLAN) 5 MG/5ML solution Place 5 mLs (5 mg total) into feeding tube 4 (four) times daily -  before meals and at bedtime. 240 mL 0  . nicotine (NICODERM CQ) 7 mg/24hr patch apply 7 mg patch daily x 4 wks after quitting smoking 14 patch 6  . Nutritional Supplements (FEEDING SUPPLEMENT, OSMOLITE 1.2 CAL,) LIQD Give 2 cans osmolite 1.2 QID with 60 cc free water before and after bolus feeding.  Give an additional 240 cc free water via PEG daily. 1896 mL 0  . omeprazole (PRILOSEC) 20 MG capsule Take 1 capsule (20 mg total) by mouth 2 (two) times daily. 60 capsule 0  . ondansetron (ZOFRAN) 8 MG tablet Take 1 tablet (8 mg total) by mouth every 8 (eight) hours as needed for nausea. 60 tablet 3  . oxyCODONE (ROXICODONE) 5 MG/5ML solution Take 5 mLs (5 mg total) by mouth every 6 (six) hours as needed. 473 mL 0  . polyethylene glycol (MIRALAX) packet Take 17 g by mouth daily. 14 each 0  . SODIUM CHLORIDE, EXTERNAL, 0.9 % SOLN Use to clean around Trach and perform Trach care once daily and PRN 1000 mL 11  . zolpidem (AMBIEN) 5 MG tablet Take 1  tablet (5 mg total) by mouth at bedtime as needed for sleep. 30 tablet 0   No current facility-administered medications for this visit.   Facility-Administered Medications Ordered in Other Visits  Medication Dose Route Frequency Provider Last Rate Last Dose  . 0.9 %  sodium chloride infusion   Intravenous Once [name], MD      . HYDROmorphone (DILAUDID) injection 2 mg  2 mg Intravenous Q2H PRN [name], MD   2 mg at 02/29/16 0928  . sodium chloride 0.9 % injection 10 mL  10 mL Intracatheter PRN [name], MD   10 mL at 02/29/16 1127  . sodium chloride flush (NS) 0.9 % injection 10 mL  10 mL Intravenous PRN [name], MD   10 mL at 02/29/16 0835    PHYSICAL EXAMINATION: ECOG PERFORMANCE STATUS: 1 - Symptomatic but completely ambulatory  Filed Vitals:   02/29/16 0849  BP: 110/81  Pulse: 98  Temp: 98.6 F (37 C)  Resp: 18   Filed Weights   02/29/16 0849  Weight: 152 lb 12.8 oz (69.31 kg)    GENERAL:alert, no distress and comfortable SKIN: skin color, texture, turgor are normal, no rashes or significant lesions EYES: normal, Conjunctiva are pink and non-injected, sclera clear OROPHARYNX:no exudate, no erythema and lips, buccal mucosa,  and tongue normal  NECK: tracheostomy in situ.  LYMPH:  no palpable lymphadenopathy in the cervical, axillary or inguinal LUNGS: clear to auscultation and percussion with normal breathing effort HEART: regular rate & rhythm and no murmurs and no lower extremity edema ABDOMEN:abdomen soft, non-tender and normal bowel sounds. The cap on the feeding tube is damaged Musculoskeletal:no cyanosis of digits and no clubbing  NEURO: alert & oriented x 3 with fluent speech, no focal motor/sensory deficits  LABORATORY DATA:  I have reviewed the data as listed    Component Value Date/Time   NA 141 02/29/2016 0821   NA 140 01/02/2016 1433   K 3.4* 02/29/2016 0821   K 4.3 01/02/2016 1433   CL 99* 01/02/2016 1433   CO2 28 02/29/2016 0821   CO2 29  01/02/2016 1422   GLUCOSE 97 02/29/2016 0821   GLUCOSE 117* 01/02/2016 1433   BUN 14.2 02/29/2016 0821   BUN 17 01/02/2016 1433   CREATININE 0.8 02/29/2016 0821   CREATININE 0.80 01/02/2016 1433   CALCIUM 9.8 02/29/2016 0821   CALCIUM 9.8 01/02/2016 1422   PROT 7.7 02/29/2016 0821   PROT 7.4 01/02/2016 1422   ALBUMIN 4.0 02/29/2016 0821   ALBUMIN 3.9 01/02/2016 1422   AST 14 02/29/2016 0821   AST 28 01/02/2016 1422   ALT 10 02/29/2016 0821   ALT 15* 01/02/2016 1422   ALKPHOS 56 02/29/2016 0821   ALKPHOS 56 01/02/2016 1422   BILITOT 0.57 02/29/2016 0821   BILITOT 0.8 01/02/2016 1422   GFRNONAA >60 01/02/2016 1422   GFRAA >60 01/02/2016 1422    No results found for: SPEP, UPEP  Lab Results  Component Value Date   WBC 7.7 02/29/2016   NEUTROABS 6.2 02/29/2016   HGB 11.5* 02/29/2016   HCT 33.9* 02/29/2016   MCV 117.3* 02/29/2016   PLT 130* 02/29/2016      Chemistry      Component Value Date/Time   NA 141 02/29/2016 0821   NA 140 01/02/2016 1433   K 3.4* 02/29/2016 0821   K 4.3 01/02/2016 1433   CL 99* 01/02/2016 1433   CO2 28 02/29/2016 0821   CO2 29 01/02/2016 1422   BUN 14.2 02/29/2016 0821   BUN 17 01/02/2016 1433   CREATININE 0.8 02/29/2016 0821   CREATININE 0.80 01/02/2016 1433      Component Value Date/Time   CALCIUM 9.8 02/29/2016 0821   CALCIUM 9.8 01/02/2016 1422   ALKPHOS 56 02/29/2016 0821   ALKPHOS 56 01/02/2016 1422   AST 14 02/29/2016 0821   AST 28 01/02/2016 1422   ALT 10 02/29/2016 0821   ALT 15* 01/02/2016 1422   BILITOT 0.57 02/29/2016 0821   BILITOT 0.8 01/02/2016 1422     ASSESSMENT & PLAN:  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[facility]) Again, the patient once it delay cycle 5 of chemotherapy because he's not been feeling well, complaining of fatigue Recent biopsy confirmed persistent disease but that does not surprise me. I do not expect current chemotherapy will bring complete remission status It would not change the course of  management as his prior PET CT scan show positive response to treatment. I will proceed with cetuximab today but delay cycle 5 until next week. Prior to cycle 6 of chemotherapy, I plan to order PET CT scan for full staging assessment of response to treatment  Pancytopenia due to antineoplastic chemotherapy [facility]) This is likely due to recent treatment.  As above, I plan to reduce dose of treatment in the  future No need transfusion needed   Protein-calorie malnutrition, severe ([place])  He is dependent on feeding tube for nutritional needs. He has mild weight loss since last time I saw him. We will get nutrition to assess him in the new future  Gastrostomy tube in place [facility]) He has slight problem with his feeding tube and will get IR to see if that cap be replaced today  Dysphagia, cricopharyngeal He has persistent dysphagia, likely due to prior radiation and persistent disease. We will try to get his thyroid supplement changed to a liquid form.   Orders Placed This Encounter  Procedures  . NM PET Image Restag (PS) Skull Base To Thigh    Standing Status: Future     Number of Occurrences:      Standing Expiration Date: 04/30/2017    Order Specific Question:  Reason for Exam (SYMPTOM  OR DIAGNOSIS REQUIRED)    Answer:  staging supraglottic ca, assess response to Rx    Order Specific Question:  Preferred imaging location?    Answer:  [facility]   All questions were answered. The patient knows to call the clinic with any problems, questions or concerns. No barriers to learning was detected. I spent 30 minutes counseling the patient face to face. The total time spent in the appointment was 40 minutes and more than 50% was on counseling and review of test results     [facility], Goff, MD 02/29/2016 2:18 PM

## 2016-02-29 NOTE — Assessment & Plan Note (Signed)
This is likely due to recent treatment.  As above, I plan to reduce dose of treatment in the future No need transfusion needed

## 2016-02-29 NOTE — Patient Instructions (Addendum)
YOU MAY CRUSH YOUR THYROID PILL AND WITH WATER PUT IT THROUGH YOUR FEEDING TUBE - JUST NOT WITH A FEEDING.   [place] Discharge Instructions for Patients Receiving Chemotherapy  Today you received the following chemotherapy agents Erbitux.   To help prevent nausea and vomiting after your treatment, we encourage you to take your nausea medication as directed.  If you develop nausea and vomiting that is not controlled by your nausea medication, call the clinic.   BELOW ARE SYMPTOMS THAT SHOULD BE REPORTED IMMEDIATELY:  *FEVER GREATER THAN 100.5 F  *CHILLS WITH OR WITHOUT FEVER  NAUSEA AND VOMITING THAT IS NOT CONTROLLED WITH YOUR NAUSEA MEDICATION  *UNUSUAL SHORTNESS OF BREATH  *UNUSUAL BRUISING OR BLEEDING  TENDERNESS IN MOUTH AND THROAT WITH OR WITHOUT PRESENCE OF ULCERS  *URINARY PROBLEMS  *BOWEL PROBLEMS  UNUSUAL RASH Items with * indicate a potential emergency and should be followed up as soon as possible.  Feel free to call the clinic you have any questions or concerns. The clinic phone number is [phone].  Please show the [place] at check-in to the Emergency Department and triage nurse.

## 2016-02-29 NOTE — Assessment & Plan Note (Signed)
He has slight problem with his feeding tube and will get IR to see if that cap be replaced today

## 2016-02-29 NOTE — Telephone Encounter (Signed)
Per staff message and POF I have scheduled appts. Advised scheduler of appts. JMW  

## 2016-02-29 NOTE — Assessment & Plan Note (Addendum)
He is dependent on feeding tube for nutritional needs. He has mild weight loss since last time I saw him. We will get nutrition to assess him in the new future

## 2016-02-29 NOTE — Patient Instructions (Signed)

## 2016-02-29 NOTE — Assessment & Plan Note (Signed)
He has persistent dysphagia, likely due to prior radiation and persistent disease. We will try to get his thyroid supplement changed to a liquid form.

## 2016-02-29 NOTE — Assessment & Plan Note (Signed)
Again, the patient once it delay cycle 5 of chemotherapy because he's not been feeling well, complaining of fatigue Recent biopsy confirmed persistent disease but that does not surprise me. I do not expect current chemotherapy will bring complete remission status It would not change the course of management as his prior PET CT scan show positive response to treatment. I will proceed with cetuximab today but delay cycle 5 until next week. Prior to cycle 6 of chemotherapy, I plan to order PET CT scan for full staging assessment of response to treatment

## 2016-03-01 ENCOUNTER — Encounter: Payer: Self-pay | Admitting: *Deleted

## 2016-03-01 NOTE — Progress Notes (Signed)
Kyle Alexander  Clinical Social Alexander received a [employer] account executive from patient's sister, [name].  Per last conversation with patient, patient requested CSW not communicate with patient's sister.  CSW will not return phone call to sister unless authorized by patient.   Polo Riley, MSW, LCSW, OSW-C Clinical Social Worker [facility] [phone]

## 2016-03-03 ENCOUNTER — Telehealth: Payer: Self-pay | Admitting: *Deleted

## 2016-03-03 NOTE — Telephone Encounter (Signed)
  Oncology Nurse Navigator Documentation  Navigator Location: [facility] (03/03/16 1212) Navigator Encounter Type: Telephone (03/03/16 1212)                   Interventions: Coordination of Care (03/03/16 1212)       Spoke with Mr. Ranger to check on his well-being and to determine his need for SLP follow-up with Garald Balding.  He stated that at present time he does not need to see Glendell Docker as he is recovering from 3/17 biopsy.  He indicated because of his insurance, he wants to conserve remaining 3 visits for the future.    He did not express any needs or concerns at this time, I encouraged him to contact me if that changes, he verbalized understanding.  Gayleen Orem, RN, BSN, [place] [facility] [place] [phone]                   Time Spent with Patient: 15 (03/03/16 1212)

## 2016-03-04 ENCOUNTER — Telehealth: Payer: Self-pay | Admitting: Hematology and Oncology

## 2016-03-04 NOTE — Telephone Encounter (Signed)
Faxed pt medical records to [facility] °

## 2016-03-07 ENCOUNTER — Ambulatory Visit (HOSPITAL_BASED_OUTPATIENT_CLINIC_OR_DEPARTMENT_OTHER): Payer: Medicaid Other

## 2016-03-07 ENCOUNTER — Encounter: Payer: Self-pay | Admitting: *Deleted

## 2016-03-07 ENCOUNTER — Other Ambulatory Visit: Payer: Self-pay

## 2016-03-07 ENCOUNTER — Other Ambulatory Visit: Payer: Self-pay | Admitting: *Deleted

## 2016-03-07 ENCOUNTER — Other Ambulatory Visit: Payer: Self-pay | Admitting: Hematology and Oncology

## 2016-03-07 ENCOUNTER — Ambulatory Visit: Payer: Medicaid Other

## 2016-03-07 ENCOUNTER — Ambulatory Visit: Payer: Self-pay | Admitting: Hematology and Oncology

## 2016-03-07 ENCOUNTER — Other Ambulatory Visit (HOSPITAL_BASED_OUTPATIENT_CLINIC_OR_DEPARTMENT_OTHER): Payer: Medicaid Other

## 2016-03-07 ENCOUNTER — Ambulatory Visit: Payer: Medicaid Other | Admitting: Nutrition

## 2016-03-07 VITALS — BP 112/68 | HR 72 | Temp 98.1°F | Resp 18

## 2016-03-07 DIAGNOSIS — Z5111 Encounter for antineoplastic chemotherapy: Secondary | ICD-10-CM | POA: Diagnosis not present

## 2016-03-07 DIAGNOSIS — C771 Secondary and unspecified malignant neoplasm of intrathoracic lymph nodes: Secondary | ICD-10-CM | POA: Diagnosis not present

## 2016-03-07 DIAGNOSIS — Z5112 Encounter for antineoplastic immunotherapy: Secondary | ICD-10-CM

## 2016-03-07 DIAGNOSIS — C321 Malignant neoplasm of supraglottis: Secondary | ICD-10-CM

## 2016-03-07 DIAGNOSIS — R12 Heartburn: Secondary | ICD-10-CM

## 2016-03-07 DIAGNOSIS — Z95828 Presence of other vascular implants and grafts: Secondary | ICD-10-CM

## 2016-03-07 LAB — COMPREHENSIVE METABOLIC PANEL
ALBUMIN: 3.8 g/dL (ref 3.5–5.0)
ALK PHOS: 54 U/L (ref 40–150)
ANION GAP: 8 meq/L (ref 3–11)
AST: 13 U/L (ref 5–34)
BUN: 13.1 mg/dL (ref 7.0–26.0)
CALCIUM: 9.5 mg/dL (ref 8.4–10.4)
CHLORIDE: 102 meq/L (ref 98–109)
CO2: 29 mEq/L (ref 22–29)
CREATININE: 0.7 mg/dL (ref 0.7–1.3)
EGFR: >90 mL/min/{1.73_m2} (ref >90)
Glucose: 111 mg/dl (ref 70–140)
Potassium: 3.8 mEq/L (ref 3.5–5.1)
Sodium: 139 mEq/L (ref 136–145)
Total Protein: 7.3 g/dL (ref 6.4–8.3)

## 2016-03-07 LAB — CBC WITH DIFFERENTIAL/PLATELET
BASO%: 0.3 % (ref 0.0–2.0)
BASOS ABS: 0 10*3/uL (ref 0.0–0.1)
EOS ABS: 0.1 10*3/uL (ref 0.0–0.5)
EOS%: 1.4 % (ref 0.0–7.0)
HCT: 32.9 % — ABNORMAL LOW (ref 38.4–49.9)
HGB: 11.1 g/dL — ABNORMAL LOW (ref 13.0–17.1)
LYMPH%: 8.4 % — AB (ref 14.0–49.0)
MCH: 39.9 pg — ABNORMAL HIGH (ref 27.2–33.4)
MCHC: 33.7 g/dL (ref 32.0–36.0)
MCV: 118.4 fL — AB (ref 79.3–98.0)
MONO#: 0.7 10*3/uL (ref 0.1–0.9)
MONO%: 10.6 % (ref 0.0–14.0)
NEUT%: 79.3 % — ABNORMAL HIGH (ref 39.0–75.0)
NEUTROS ABS: 5.1 10*3/uL (ref 1.5–6.5)
PLATELETS: 169 10*3/uL (ref 140–400)
RBC: 2.78 10*6/uL — AB (ref 4.20–5.82)
RDW: 13.4 % (ref 11.0–14.6)
WBC: 6.5 10*3/uL (ref 4.0–10.3)
lymph#: 0.5 10*3/uL — ABNORMAL LOW (ref 0.9–3.3)

## 2016-03-07 LAB — MAGNESIUM: Magnesium: 1.9 mg/dl (ref 1.5–2.5)

## 2016-03-07 MED ORDER — PALONOSETRON HCL INJECTION 0.25 MG/5ML
0.2500 mg | Freq: Once | INTRAVENOUS | Status: AC
  Administered 2016-03-07: 0.25 mg via INTRAVENOUS

## 2016-03-07 MED ORDER — HYDROMORPHONE HCL 4 MG/ML IJ SOLN
INTRAMUSCULAR | Status: AC
  Filled 2016-03-07: qty 1

## 2016-03-07 MED ORDER — CARBOPLATIN CHEMO INJECTION 450 MG/45ML
326.7500 mg | Freq: Once | INTRAVENOUS | Status: AC
  Administered 2016-03-07: 330 mg via INTRAVENOUS
  Filled 2016-03-07: qty 33

## 2016-03-07 MED ORDER — FLUOROURACIL CHEMO INJECTION 5 GM/100ML
500.0000 mg/m2/d | INTRAVENOUS | Status: DC
  Administered 2016-03-07: 3700 mg via INTRAVENOUS
  Filled 2016-03-07: qty 74

## 2016-03-07 MED ORDER — CETUXIMAB CHEMO IV INJECTION 200 MG/100ML
250.0000 mg/m2 | Freq: Once | INTRAVENOUS | Status: AC
  Administered 2016-03-07: 500 mg via INTRAVENOUS
  Filled 2016-03-07: qty 200

## 2016-03-07 MED ORDER — PALONOSETRON HCL INJECTION 0.25 MG/5ML
INTRAVENOUS | Status: AC
Start: 2016-03-07 — End: 2016-03-07
  Filled 2016-03-07: qty 5

## 2016-03-07 MED ORDER — GUAIFENESIN 100 MG/5ML PO SOLN: 5.0000 mL | ORAL | Status: DC | PRN

## 2016-03-07 MED ORDER — METOCLOPRAMIDE HCL 5 MG/5ML PO SOLN: 5.0000 mg | Freq: Three times a day (TID) | ORAL | Status: DC

## 2016-03-07 MED ORDER — DIPHENHYDRAMINE HCL 50 MG/ML IJ SOLN
INTRAMUSCULAR | Status: AC
  Filled 2016-03-07: qty 1

## 2016-03-07 MED ORDER — FOSAPREPITANT DIMEGLUMINE INJECTION 150 MG
Freq: Once | INTRAVENOUS | Status: AC
  Administered 2016-03-07: 14:00:00 via INTRAVENOUS
  Filled 2016-03-07: qty 5

## 2016-03-07 MED ORDER — DIPHENHYDRAMINE HCL 50 MG/ML IJ SOLN
50.0000 mg | Freq: Once | INTRAMUSCULAR | Status: AC
  Administered 2016-03-07: 50 mg via INTRAVENOUS

## 2016-03-07 MED ORDER — HYDROMORPHONE HCL 4 MG/ML IJ SOLN
2.0000 mg | INTRAMUSCULAR | Status: DC | PRN
Start: 2016-03-07 — End: 2016-03-07
  Administered 2016-03-07: 2 mg via INTRAVENOUS

## 2016-03-07 MED ORDER — SODIUM CHLORIDE 0.9% FLUSH
10.0000 mL | INTRAVENOUS | Status: DC | PRN
  Administered 2016-03-07: 10 mL via INTRAVENOUS
  Filled 2016-03-07: qty 10

## 2016-03-07 MED ORDER — SODIUM CHLORIDE 0.9 % IV SOLN
Freq: Once | INTRAVENOUS | Status: AC
  Administered 2016-03-07: 11:00:00 via INTRAVENOUS

## 2016-03-07 MED FILL — METOCLOPRAMIDE 5 MG/5 ML SO: 5 | 12 days supply | Qty: 240 | Fill #0

## 2016-03-07 MED FILL — ONDANSETRON HCL 8 MG TABLET: 8 | 20 days supply | Qty: 60 | Fill #3

## 2016-03-07 NOTE — Progress Notes (Signed)
Nutrition follow-up completed with patient diagnosed with cancer of the supraglottis. Current weight documented as 152.8 pounds on March 20, increased from 113.5 pounds 04/20/2015 BMI: 21.92. Patient reports tolerating 8 cans Osmolite 1.2 daily with 60 cc free water before and after bolus feedings along with additional water flushes of 240 cc Current tube feeding providing 2280 cal, 106 g protein, 2280 mL free water. Patient also able to take water by mouth but is not consuming other liquids or food by mouth. Patient reports liquids come up through his nose when he tries to swallow. Patient reports he has 1 loose stool a day, typically after giving a tube feeding but denies diarrhea. Patient desires to continue to gain weight.  Revised estimated nutrition needs: 2300-2500 cal, 95-110 grams protein, 2.3 L fluid.  Nutrition diagnosis: Unintended weight loss has improved.  Intervention:  Patient will continue Osmolite 1.2 - 4 cans a day and will try substituting 4 cans a day of Jevity 1.5 for remaining 4 cans Osmolite 1.2. This will provide increased fiber an additional calories and protein. Teach back method was used and patient was provided with sample of Jevity 1.5.  New tube feeding regimen would provide 2560 cal, 113.2 g protein, 2220 mL free water.  Monitoring, evaluation, goals:  Patient will tolerate tube feeding change to promote weight gain. Once tolerance established new orders will be written.  Next visit: Monday, April 3, during infusion.  **Disclaimer: This note was dictated with voice recognition software. Similar sounding words can inadvertently be transcribed and this note may contain transcription errors which may not have been corrected upon publication of note.**

## 2016-03-07 NOTE — Patient Instructions (Signed)

## 2016-03-07 NOTE — Patient Instructions (Signed)
[place] Discharge Instructions for Patients Receiving Chemotherapy  Today you received the following chemotherapy agents Erbitux, Carboplatin and Fluorouracil.  To help prevent nausea and vomiting after your treatment, we encourage you to take your nausea medication as directed. NO ZOFRAN FOR 3 DAYS.  If you develop nausea and vomiting that is not controlled by your nausea medication, call the clinic.   BELOW ARE SYMPTOMS THAT SHOULD BE REPORTED IMMEDIATELY:  *FEVER GREATER THAN 100.5 F  *CHILLS WITH OR WITHOUT FEVER  NAUSEA AND VOMITING THAT IS NOT CONTROLLED WITH YOUR NAUSEA MEDICATION  *UNUSUAL SHORTNESS OF BREATH  *UNUSUAL BRUISING OR BLEEDING  TENDERNESS IN MOUTH AND THROAT WITH OR WITHOUT PRESENCE OF ULCERS  *URINARY PROBLEMS  *BOWEL PROBLEMS  UNUSUAL RASH Items with * indicate a potential emergency and should be followed up as soon as possible.  Feel free to call the clinic you have any questions or concerns. The clinic phone number is [phone].  Please show the [place] at check-in to the Emergency Department and triage nurse.

## 2016-03-07 NOTE — Telephone Encounter (Signed)
Pt asked about his nausea medication and wanted to confirm what he should be taking to prevent/ treat nausea after his chemo today.  Pt also c/o dry, frequent cough and asks what he can take for the cough?  Per Dr. Alvy Bimler pt to take Zofran and Reglan for nausea.  Can take Robitussin OTC for cough.  Re-ordered Reglan per Dr. Alvy Bimler.  Pt also ask for refill on oxycodone but it is too soon.  Instructed pt it will be time for refill this Friday when he comes in for his Pump D/C.    Med list printed for pt and highlighted Nausea meds; Reglan and Zofran.  Instructed to take Reglan four times a day regularly and Zofran three times a day as needed for nausea starting on 3rd day after chemo (Thursday).  Instructed pt to call if he has nausea in spite of taking both these meds.  Instructed to take Robitussin for cough and let us know if this doesn't help.   Pt verbalized understanding of above.

## 2016-03-07 NOTE — Progress Notes (Signed)
  Oncology Nurse Navigator Documentation  Navigator Location: [facility]Med Onc (03/07/16 1215) Navigator Encounter Type: Clinic/MDC (03/07/16 1215)   Abnormal Finding Date: 03/16/15 (03/07/16 1215) Confirmed Diagnosis Date: 03/16/15 (03/07/16 1215)   Treatment Initiated Date: 04/16/15 (03/07/16 1215) Patient Visit Type: MedOnc (03/07/16 1215) Treatment Phase: Active Tx (03/07/16 1215)     To provide support and encouragement, care continuity and to assess for needs, met with Mr. Kulzer in Infusion. He expressed "I'm just keepin' on".  He did not express any needs or concerns at this time, I encouraged him to contact me if that changes, he verbalized understanding.  Gayleen Orem, RN, BSN, [place] [facility] [phone]                            Time Spent with Patient: 15 (03/07/16 1215)

## 2016-03-09 ENCOUNTER — Telehealth: Payer: Self-pay | Admitting: *Deleted

## 2016-03-09 NOTE — Telephone Encounter (Signed)
Informed pt of Pump d/c scheduled for Friday at 3 pm.  He verbalized understanding and states he will need his Rx for Oxycodone on Friday.  Informed him Dr. Alvy Bimler can do Rx on Friday.

## 2016-03-11 ENCOUNTER — Other Ambulatory Visit: Payer: Self-pay | Admitting: *Deleted

## 2016-03-11 ENCOUNTER — Telehealth: Payer: Self-pay | Admitting: *Deleted

## 2016-03-11 ENCOUNTER — Ambulatory Visit (HOSPITAL_BASED_OUTPATIENT_CLINIC_OR_DEPARTMENT_OTHER): Payer: Medicaid Other

## 2016-03-11 VITALS — BP 102/69 | HR 70 | Temp 98.7°F | Resp 18

## 2016-03-11 DIAGNOSIS — C321 Malignant neoplasm of supraglottis: Secondary | ICD-10-CM

## 2016-03-11 IMAGING — RF DG ESOPHAGUS
9 of 10 series · 19 of 24 positions shown · non-contrast
Comparison: PET-CT 12/30/2015 and earlier.

CLINICAL DATA: 54-year-old with current history of squamous cell
carcinoma of the supraglottic neck, post radiation therapy,
presenting with nasopharyngeal reflux of liquids and progressive
dysphagia.

EXAM:
ESOPHOGRAM/BARIUM SWALLOW
TECHNIQUE: Single contrast examination was performed using  thin barium.
FLUOROSCOPY TIME:  Radiation Exposure Index (as provided by the
fluoroscopic device): 17 dGy cm squared

[Series 1: run · 2 of 10 slices shown (1 of 9)]
[im 1/10]
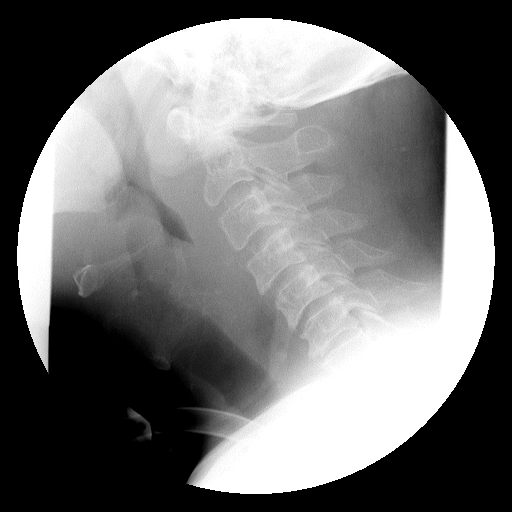
[im 5/10]
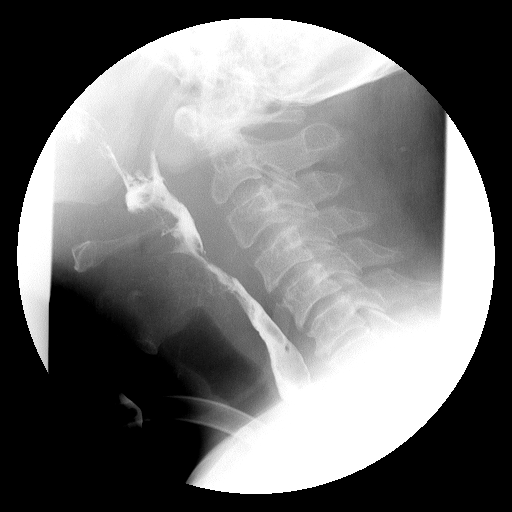

[Series 2: run · 3 of 10 slices shown (2 of 9)]
[im 1/10]
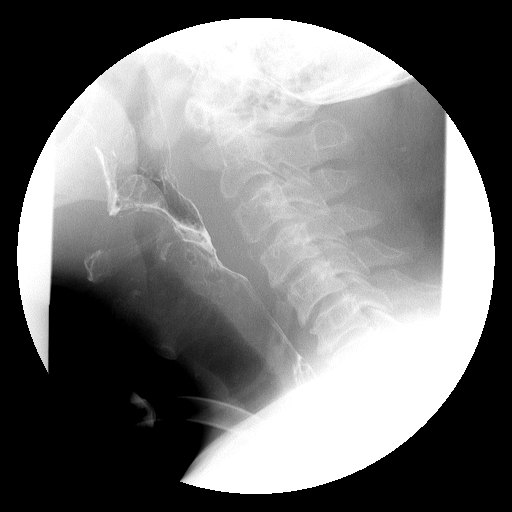
[im 5/10]
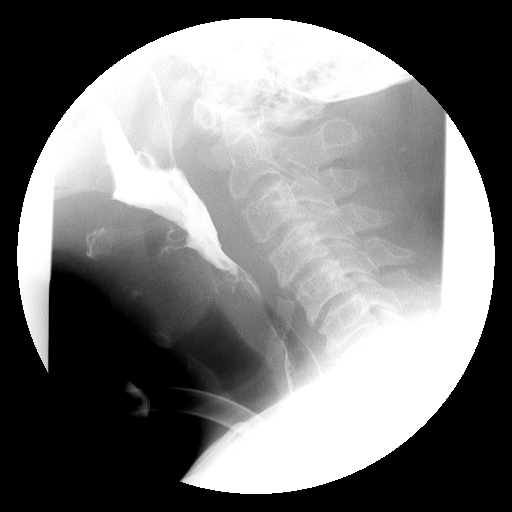
[im 10/10]
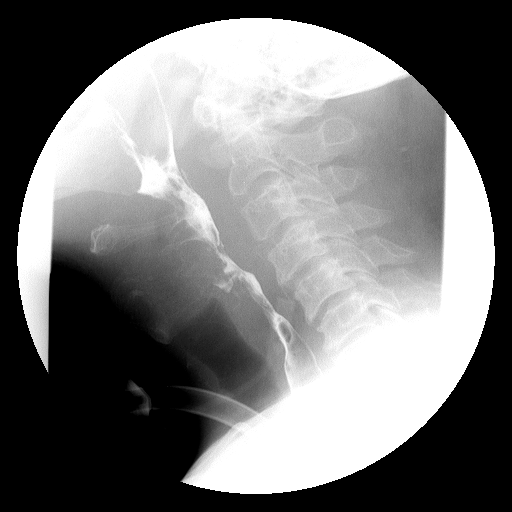

[Series 3: run · 3 of 10 slices shown (3 of 9)]
[im 1/10]
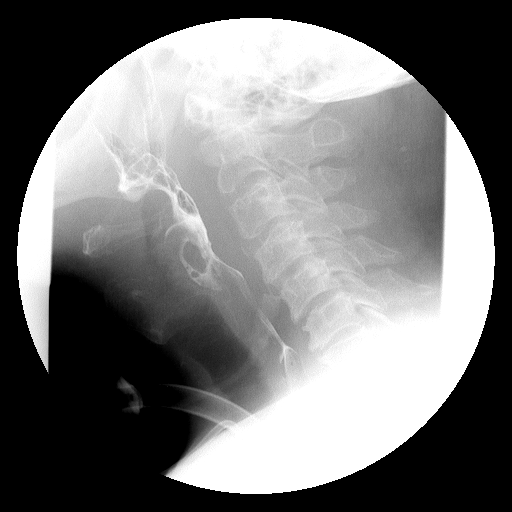
[im 7/10]
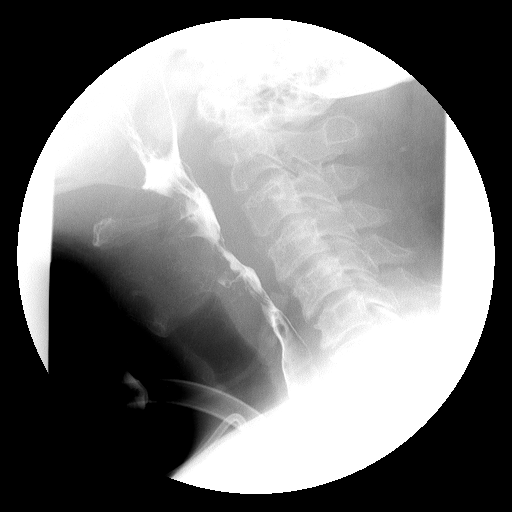
[im 10/10]
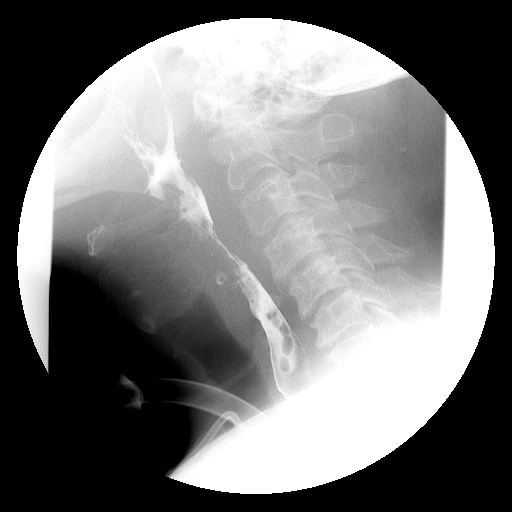

[Series 4: run · 2 of 7 slices shown (4 of 9)]
[im 1/7]
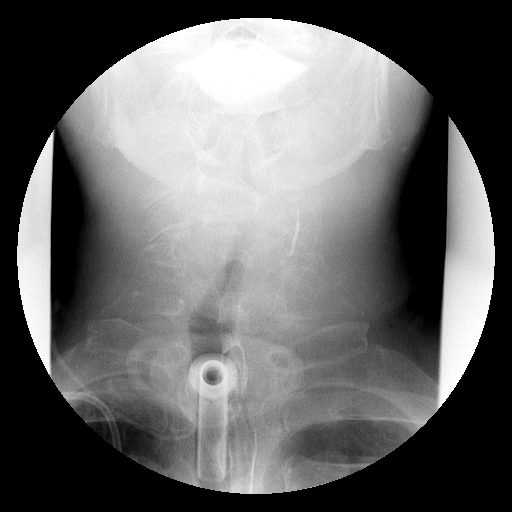
[im 7/7]
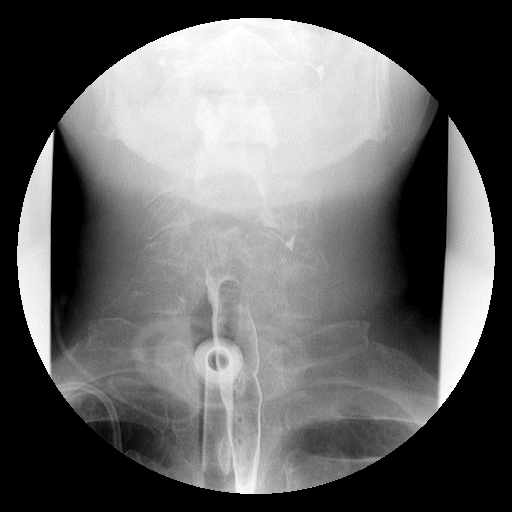

[Series 5: run · 3 of 9 slices shown (5 of 9)]
[im 1/9]
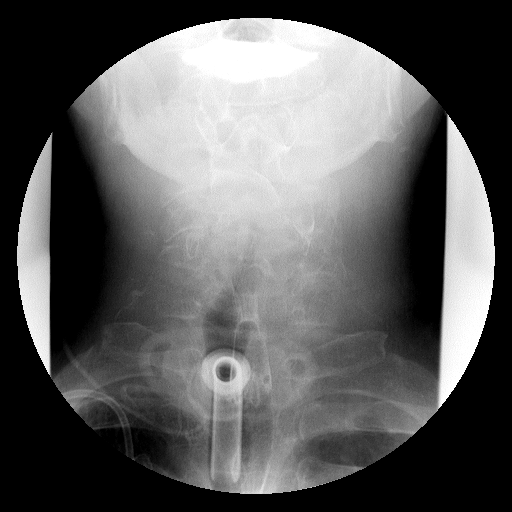
[im 3/9]
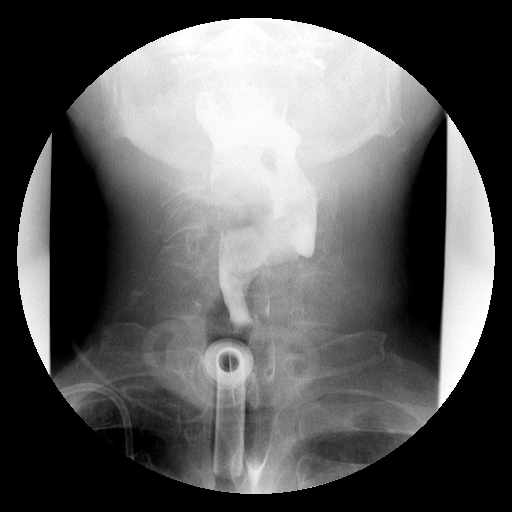
[im 6/9]
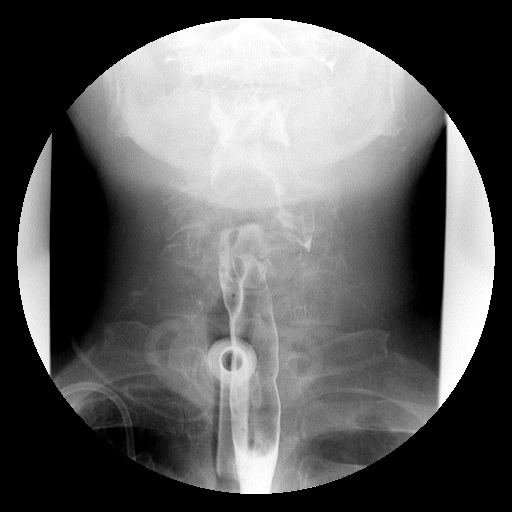

[Series 6: run · 3 of 6 slices shown (6 of 9)]
[im 1/6]
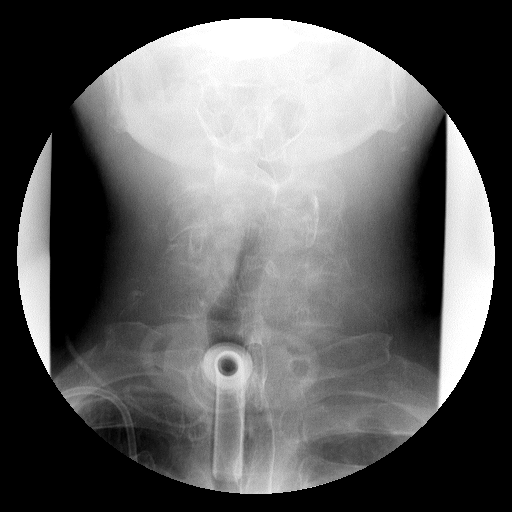
[im 3/6]
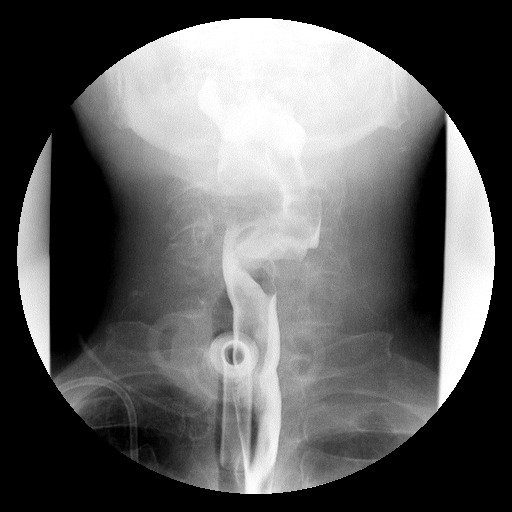
[im 6/6]
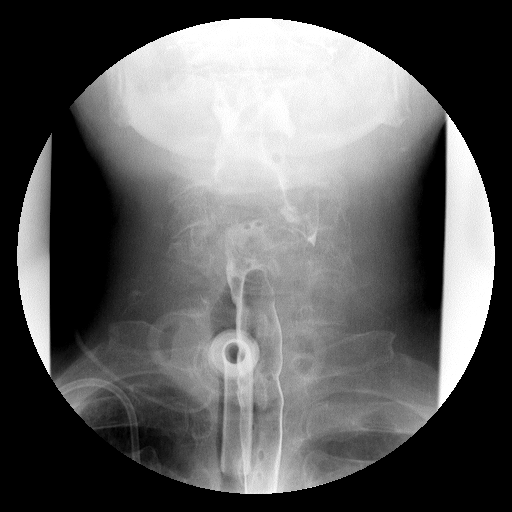

[Series 7: run · 1 of 1 slices shown (7 of 9)]
[im 1/1]
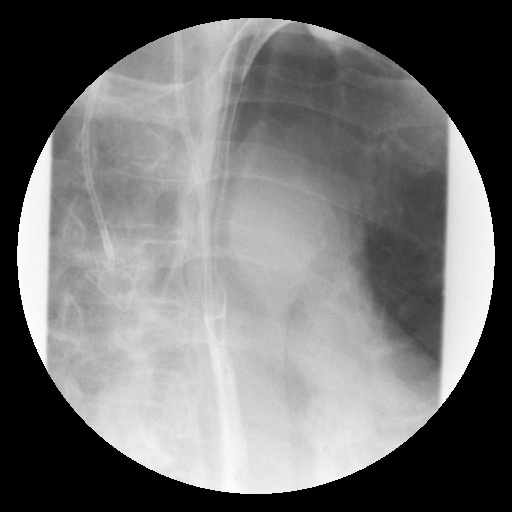

[Series 9: run · 1 of 2 slices shown (8 of 9)]
[im 1/2]
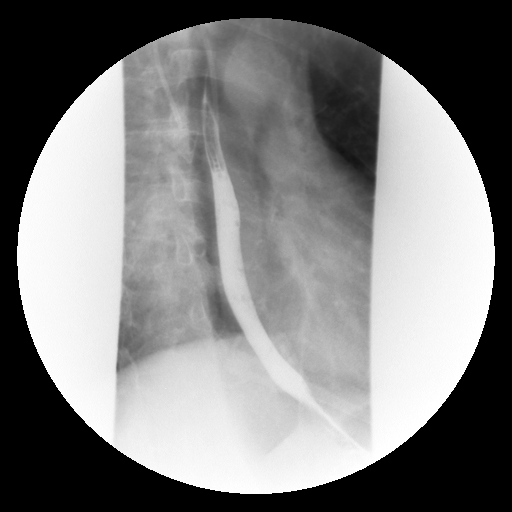

[Series 10: run · 1 of 1 slices shown (9 of 9)]
[im 1/1]
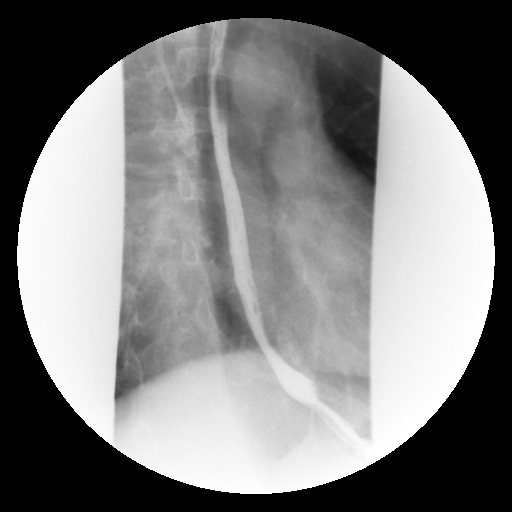

[19 of 24 positions shown; findings below may reference images not displayed]

FINDINGS: Patient swallowed the thin barium liquid without difficulty. Rapid
sequence imaging of the pharynx in the AP and lateral projections
was performed. There is moderate to marked narrowing of the
supraglottic pharynx, with mass effect on the right side of the
pharynx related to the known squamous cell carcinoma. Nasopharyngeal
reflux occurred with every swallow. No evidence of laryngeal
penetration or tracheal aspiration.

Evaluation of the esophagus with patient in the upright position
demonstrated no focal abnormalities.
IMPRESSION: 1. Moderate to marked narrowing of the supraglottic pharynx related
to the residual mass in the right supraglottic region and post
radiation changes.
2. Nasophayrngeal reflux which occurred with every swallow.
3. No focal abnormalities involving the esophagus.
Preliminary results were discussed with the patient at the time of
the examination.

## 2016-03-11 MED ORDER — OXYCODONE HCL 5 MG/5ML PO SOLN: 5.0000 mg | Freq: Four times a day (QID) | ORAL | Status: DC | PRN

## 2016-03-11 MED ORDER — HEPARIN SOD (PORK) LOCK FLUSH 100 UNIT/ML IV SOLN
500.0000 [IU] | Freq: Once | INTRAVENOUS | Status: AC | PRN
  Administered 2016-03-11: 500 [IU]
  Filled 2016-03-11: qty 5

## 2016-03-11 MED ORDER — SODIUM CHLORIDE 0.9 % IJ SOLN
10.0000 mL | INTRAMUSCULAR | Status: DC | PRN
  Administered 2016-03-11: 10 mL
  Filled 2016-03-11: qty 10

## 2016-03-11 NOTE — Telephone Encounter (Signed)
Reminded pt he has Rx for oxycodone to pick up.  He came early for his Flush appt today and forgot to pick up Rx.  He says he is coming back to get it.  Placed in locked Rx book for pick up.

## 2016-03-14 ENCOUNTER — Other Ambulatory Visit: Payer: Self-pay | Admitting: Hematology and Oncology

## 2016-03-14 ENCOUNTER — Telehealth: Payer: Self-pay | Admitting: *Deleted

## 2016-03-14 ENCOUNTER — Ambulatory Visit (HOSPITAL_BASED_OUTPATIENT_CLINIC_OR_DEPARTMENT_OTHER): Payer: Medicaid Other

## 2016-03-14 ENCOUNTER — Other Ambulatory Visit: Payer: Self-pay | Admitting: *Deleted

## 2016-03-14 ENCOUNTER — Ambulatory Visit: Payer: Medicaid Other | Admitting: Nutrition

## 2016-03-14 ENCOUNTER — Encounter: Payer: Self-pay | Admitting: Hematology and Oncology

## 2016-03-14 VITALS — BP 125/73 | HR 68 | Temp 98.6°F | Resp 17

## 2016-03-14 DIAGNOSIS — C321 Malignant neoplasm of supraglottis: Secondary | ICD-10-CM | POA: Diagnosis not present

## 2016-03-14 DIAGNOSIS — C771 Secondary and unspecified malignant neoplasm of intrathoracic lymph nodes: Secondary | ICD-10-CM | POA: Diagnosis not present

## 2016-03-14 DIAGNOSIS — Z5112 Encounter for antineoplastic immunotherapy: Secondary | ICD-10-CM | POA: Diagnosis not present

## 2016-03-14 LAB — CBC WITH DIFFERENTIAL/PLATELET
BASO%: 0.4 % (ref 0.0–2.0)
BASOS ABS: 0 10*3/uL (ref 0.0–0.1)
EOS%: 0.8 % (ref 0.0–7.0)
Eosinophils Absolute: 0 10*3/uL (ref 0.0–0.5)
HEMATOCRIT: 34.7 % — AB (ref 38.4–49.9)
HGB: 11.8 g/dL — ABNORMAL LOW (ref 13.0–17.1)
LYMPH#: 0.5 10*3/uL — AB (ref 0.9–3.3)
LYMPH%: 9.2 % — ABNORMAL LOW (ref 14.0–49.0)
MCH: 39.7 pg — AB (ref 27.2–33.4)
MCHC: 34.1 g/dL (ref 32.0–36.0)
MCV: 116.3 fL — ABNORMAL HIGH (ref 79.3–98.0)
MONO#: 0.4 10*3/uL (ref 0.1–0.9)
MONO%: 6.5 % (ref 0.0–14.0)
NEUT#: 4.9 10*3/uL (ref 1.5–6.5)
NEUT%: 83.1 % — AB (ref 39.0–75.0)
PLATELETS: 186 10*3/uL (ref 140–400)
RBC: 2.98 10*6/uL — ABNORMAL LOW (ref 4.20–5.82)
RDW: 13.3 % (ref 11.0–14.6)
WBC: 5.9 10*3/uL (ref 4.0–10.3)

## 2016-03-14 LAB — COMPREHENSIVE METABOLIC PANEL
ALT: 9 U/L (ref 0–55)
AST: 12 U/L (ref 5–34)
Albumin: 3.9 g/dL (ref 3.5–5.0)
Alkaline Phosphatase: 56 U/L (ref 40–150)
Anion Gap: 9 mEq/L (ref 3–11)
BILIRUBIN TOTAL: 0.4 mg/dL (ref 0.20–1.20)
BUN: 11.2 mg/dL (ref 7.0–26.0)
CO2: 27 meq/L (ref 22–29)
CREATININE: 0.8 mg/dL (ref 0.7–1.3)
Calcium: 9.8 mg/dL (ref 8.4–10.4)
Chloride: 104 mEq/L (ref 98–109)
EGFR: >90 mL/min/{1.73_m2} (ref >90)
GLUCOSE: 100 mg/dL (ref 70–140)
Potassium: 3.4 mEq/L — ABNORMAL LOW (ref 3.5–5.1)
Sodium: 140 mEq/L (ref 136–145)
TOTAL PROTEIN: 7.5 g/dL (ref 6.4–8.3)

## 2016-03-14 LAB — MAGNESIUM: Magnesium: 2 mg/dl (ref 1.5–2.5)

## 2016-03-14 MED ORDER — SODIUM CHLORIDE 0.9 % IV SOLN
Freq: Once | INTRAVENOUS | Status: AC
  Administered 2016-03-14: 09:00:00 via INTRAVENOUS

## 2016-03-14 MED ORDER — HYDROMORPHONE HCL 4 MG/ML IJ SOLN
INTRAMUSCULAR | Status: AC
Start: 2016-03-14 — End: 2016-03-14
  Filled 2016-03-14: qty 1

## 2016-03-14 MED ORDER — CETUXIMAB CHEMO IV INJECTION 200 MG/100ML
250.0000 mg/m2 | Freq: Once | INTRAVENOUS | Status: AC
  Administered 2016-03-14: 500 mg via INTRAVENOUS
  Filled 2016-03-14: qty 200

## 2016-03-14 MED ORDER — HYDROMORPHONE HCL 4 MG/ML IJ SOLN
2.0000 mg | INTRAMUSCULAR | Status: DC | PRN
  Administered 2016-03-14: 2 mg via INTRAVENOUS

## 2016-03-14 MED ORDER — SODIUM CHLORIDE 0.9 % IJ SOLN
10.0000 mL | INTRAMUSCULAR | Status: DC | PRN
Start: 2016-03-14 — End: 2016-03-14
  Administered 2016-03-14: 10 mL
  Filled 2016-03-14: qty 10

## 2016-03-14 MED ORDER — DIPHENHYDRAMINE HCL 50 MG/ML IJ SOLN
INTRAMUSCULAR | Status: AC
  Filled 2016-03-14: qty 1

## 2016-03-14 MED ORDER — HEPARIN SOD (PORK) LOCK FLUSH 100 UNIT/ML IV SOLN
500.0000 [IU] | Freq: Once | INTRAVENOUS | Status: AC | PRN
  Administered 2016-03-14: 500 [IU]
  Filled 2016-03-14: qty 5

## 2016-03-14 MED ORDER — DIPHENHYDRAMINE HCL 50 MG/ML IJ SOLN
50.0000 mg | Freq: Once | INTRAMUSCULAR | Status: AC
  Administered 2016-03-14: 50 mg via INTRAVENOUS

## 2016-03-14 MED ORDER — HYDROMORPHONE HCL 4 MG/ML IJ SOLN
INTRAMUSCULAR | Status: AC
  Filled 2016-03-14: qty 1

## 2016-03-14 MED ORDER — SODIUM CHLORIDE 0.9 % IV SOLN
Freq: Once | INTRAVENOUS | Status: AC
  Administered 2016-03-14: 09:00:00 via INTRAVENOUS
  Filled 2016-03-14: qty 4

## 2016-03-14 MED FILL — oxyCODONE HCL 5 MG/5ML SOLN: 5 | 23 days supply | Qty: 473 | Fill #0

## 2016-03-14 NOTE — Telephone Encounter (Signed)
Pt reported new pain on right side of his face mostly around his eye on his right cheek, right temple and right side of forehead.  He was unable to describe pain just says "it hurts" and he says pain medication doesn't seem to help it very much.  He feels like it is swollen on his right face.  There is no visible swelling right face.  He denies fever or any tooth/ jaw ache.  He says he is more concerned about the reason for this pain than the actual pain.  Notified Dr. Alvy Bimler of new pain and she says she will have to wait for PET scan next week to try to find a reason for his pain.   Discussed w/ pt to call us if pain worsens, any visible swelling and any fevers.  He verbalized understanding.

## 2016-03-14 NOTE — Progress Notes (Signed)
Nutrition follow-up completed with patient in the chemotherapy area being treated for cancer of the supraglottis. There is no current weight on patient. Last weight documented March 20 was 152.8 pounds. Patient reports he tolerated 4 cans Osmolite 1.2+4 cans Jevity 1.5 well. Reports diarrhea resolved with switching to Jevity 1.5. Patient complains that it takes 8 minutes for Jevity 1.5 to infuse Reports vomiting over the weekend after his chemotherapy was discontinued.  Estimated nutrition needs: 2300-2500 calories, 95-110 grams protein, 2.3 L fluid.  Nutrition diagnosis: Unintended weight loss cannot be evaluated.  Intervention:  Patient will continue Osmolite 1.2 - 4 cans +4 cans a day Jevity 1.5 to provide 2560 cal, 113.2 g protein, 2220 mL free water. Educated patient on the importance of allowing 10 minutes per can for tube feeding to infuse Patient will continue over this week.  We will discuss again next Monday.  Monitoring, evaluation, goals: Patient will continue to tolerate tube feeding to promote weight gain will follow-up with patient next week.  Next visit: Monday, April 10, during infusion.  **Disclaimer: This note was dictated with voice recognition software. Similar sounding words can inadvertently be transcribed and this note may contain transcription errors which may not have been corrected upon publication of note.**

## 2016-03-14 NOTE — Patient Instructions (Signed)
Cetuximab injection What is this medicine? CETUXIMAB (se TUX i mab) is a monoclonal antibody. It is used to treat colorectal cancer and head and neck cancer. This medicine may be used for other purposes; ask your health care provider or pharmacist if you have questions. What should I tell my health care provider before I take this medicine? They need to know if you have any of these conditions: -heart disease -history of irregular heartbeat -history of low levels of calcium, magnesium, or potassium in the blood -lung or breathing disease, like asthma -an unusual or allergic reaction to cetuximab, other medicines, foods, dyes, or preservatives -pregnant or trying to get pregnant -breast-feeding How should I use this medicine? This drug is given as an infusion into a vein. It is administered in a hospital or clinic by a specially trained health care professional. Talk to your pediatrician regarding the use of this medicine in children. Special care may be needed. Overdosage: If you think you have taken too much of this medicine contact a poison control center or emergency room at once. NOTE: This medicine is only for you. Do not share this medicine with others. What if I miss a dose? It is important not to miss your dose. Call your doctor or health care professional if you are unable to keep an appointment. What may interact with this medicine? Interactions are not expected. This list may not describe all possible interactions. Give your health care provider a list of all the medicines, herbs, non-prescription drugs, or dietary supplements you use. Also tell them if you smoke, drink alcohol, or use illegal drugs. Some items may interact with your medicine. What should I watch for while using this medicine? Visit your doctor or health care professional for regular checks on your progress. This drug may make you feel generally unwell. This is not uncommon, as chemotherapy can affect healthy cells  as well as cancer cells. Report any side effects. Continue your course of treatment even though you feel ill unless your doctor tells you to stop. This medicine can make you more sensitive to the sun. Keep out of the sun while taking this medicine and for 2 months after the last dose. If you cannot avoid being in the sun, wear protective clothing and use sunscreen. Do not use sun lamps or tanning beds/booths. You may need blood work done while you are taking this medicine. In some cases, you may be given additional medicines to help with side effects. Follow all directions for their use. Call your doctor or health care professional for advice if you get a fever, chills or sore throat, or other symptoms of a cold or flu. Do not treat yourself. This drug decreases your body's ability to fight infections. Try to avoid being around people who are sick. Avoid taking products that contain aspirin, acetaminophen, ibuprofen, naproxen, or ketoprofen unless instructed by your doctor. These medicines may hide a fever. Do not become pregnant while taking this medicine. Women should inform their doctor if they wish to become pregnant or think they might be pregnant. There is a potential for serious side effects to an unborn child. Use adequate birth control methods. Avoid pregnancy for at least 6 months after your last dose. Talk to your health care professional or pharmacist for more information. Do not breast-feed an infant while taking this medicine or during the 2 months after your last dose. What side effects may I notice from receiving this medicine? Side effects that you should report to   your doctor or health care professional as soon as possible: -allergic reactions like skin rash, itching or hives, swelling of the face, lips, or tongue -breathing problems -changes in vision -fast, irregular heartbeat -feeling faint or lightheaded, falls -fever, chills -mouth sores -redness, blistering, peeling or  loosening of the skin, including inside the mouth -trouble passing urine or change in the amount of urine -unusually weak or tired Side effects that usually do not require medical attention (report to your doctor or health care professional if they continue or are bothersome): -changes in skin like acne, cracks, skin dryness -constipation -diarrhea -headache -nail changes -nausea, vomiting -stomach upset -weight loss This list may not describe all possible side effects. Call your doctor for medical advice about side effects. You may report side effects to FDA at 1-800-FDA-1088. Where should I keep my medicine? This drug is given in a hospital or clinic and will not be stored at home. NOTE: This sheet is a summary. It may not cover all possible information. If you have questions about this medicine, talk to your doctor, pharmacist, or health care provider.    2016, Elsevier/Gold Standard. (2015-02-04 22:27:08)  [facility] Discharge Instructions for Patients Receiving Chemotherapy  Today you received the following chemotherapy agents Erbitux.  To help prevent nausea and vomiting after your treatment, we encourage you to take your nausea medication as directed.   If you develop nausea and vomiting that is not controlled by your nausea medication, call the clinic.   BELOW ARE SYMPTOMS THAT SHOULD BE REPORTED IMMEDIATELY:  *FEVER GREATER THAN 100.5 F  *CHILLS WITH OR WITHOUT FEVER  NAUSEA AND VOMITING THAT IS NOT CONTROLLED WITH YOUR NAUSEA MEDICATION  *UNUSUAL SHORTNESS OF BREATH  *UNUSUAL BRUISING OR BLEEDING  TENDERNESS IN MOUTH AND THROAT WITH OR WITHOUT PRESENCE OF ULCERS  *URINARY PROBLEMS  *BOWEL PROBLEMS  UNUSUAL RASH Items with * indicate a potential emergency and should be followed up as soon as possible.  Feel free to call the clinic you have any questions or concerns. The clinic phone number is [phone].  Please show the CHEMO ALERT CARD  at check-in to the Emergency Department and triage nurse.      

## 2016-03-17 ENCOUNTER — Telehealth: Payer: Self-pay | Admitting: *Deleted

## 2016-03-17 NOTE — Telephone Encounter (Signed)
Per Radiology Scheduler 4/14 is next available PET and pt already scheduled for 4/14.   Called Radiology Director, Imagene Riches, to request sooner PET d/t pt having increase in pain and we are unsure of cause of pain.   Kenney Houseman says she will check if it can be moved sooner and call nurse back. Notified pt I will call him back when I find out about PET.  Instructed he may increase Oxycodone from 5 ml to 10 ml as needed for pain per Dr. Alvy Bimler.  He verbalized understanding.

## 2016-03-17 NOTE — Telephone Encounter (Signed)
Pt reports pain on right side of his face is getting worse and is "driving me crazy."  He asks if we can try to get the PET scan moved up sooner?   Or anything else that can be done?

## 2016-03-18 ENCOUNTER — Telehealth: Payer: Self-pay | Admitting: *Deleted

## 2016-03-18 ENCOUNTER — Other Ambulatory Visit: Payer: Self-pay | Admitting: Hematology and Oncology

## 2016-03-18 NOTE — Telephone Encounter (Signed)
Informed pt that PET scan still scheduled for 4/14.  I was told by the director that there is no way to get it moved up sooner, but they will call us if any cancellations before 4/14.   Meanwhile instructed pt Dr. Christiana Pellant says he can take 10 mg of oxycodone instead of 5 mg as prescribed.   Also suggested he might try OTC allergy medication or anti histamine in case the pain is being caused by "sinus" pressure or inflammation.  Pt says he is going to try anti allergy medication to see if this helps.   He confirmed his appt Monday for treatment as scheduled.

## 2016-03-21 ENCOUNTER — Other Ambulatory Visit: Payer: Self-pay | Admitting: Hematology and Oncology

## 2016-03-21 ENCOUNTER — Encounter: Payer: Self-pay | Admitting: *Deleted

## 2016-03-21 ENCOUNTER — Other Ambulatory Visit (HOSPITAL_BASED_OUTPATIENT_CLINIC_OR_DEPARTMENT_OTHER): Payer: Medicaid Other

## 2016-03-21 ENCOUNTER — Ambulatory Visit (HOSPITAL_BASED_OUTPATIENT_CLINIC_OR_DEPARTMENT_OTHER): Payer: Medicaid Other

## 2016-03-21 ENCOUNTER — Ambulatory Visit: Payer: Medicaid Other | Admitting: Nutrition

## 2016-03-21 VITALS — BP 103/71 | HR 72 | Temp 98.3°F | Resp 18

## 2016-03-21 DIAGNOSIS — C321 Malignant neoplasm of supraglottis: Secondary | ICD-10-CM

## 2016-03-21 DIAGNOSIS — C771 Secondary and unspecified malignant neoplasm of intrathoracic lymph nodes: Secondary | ICD-10-CM

## 2016-03-21 DIAGNOSIS — Z5112 Encounter for antineoplastic immunotherapy: Secondary | ICD-10-CM | POA: Diagnosis present

## 2016-03-21 LAB — CBC WITH DIFFERENTIAL/PLATELET
BASO%: 0.3 % (ref 0.0–2.0)
Basophils Absolute: 0 10*3/uL (ref 0.0–0.1)
EOS%: 0.6 % (ref 0.0–7.0)
Eosinophils Absolute: 0 10*3/uL (ref 0.0–0.5)
HCT: 34.7 % — ABNORMAL LOW (ref 38.4–49.9)
HEMOGLOBIN: 11.8 g/dL — AB (ref 13.0–17.1)
LYMPH%: 8.7 % — AB (ref 14.0–49.0)
MCH: 39.2 pg — ABNORMAL HIGH (ref 27.2–33.4)
MCHC: 34.2 g/dL (ref 32.0–36.0)
MCV: 114.9 fL — AB (ref 79.3–98.0)
MONO#: 0.5 10*3/uL (ref 0.1–0.9)
MONO%: 8 % (ref 0.0–14.0)
NEUT%: 82.4 % — ABNORMAL HIGH (ref 39.0–75.0)
NEUTROS ABS: 5.4 10*3/uL (ref 1.5–6.5)
Platelets: 170 10*3/uL (ref 140–400)
RBC: 3.02 10*6/uL — AB (ref 4.20–5.82)
RDW: 13.2 % (ref 11.0–14.6)
WBC: 6.6 10*3/uL (ref 4.0–10.3)
lymph#: 0.6 10*3/uL — ABNORMAL LOW (ref 0.9–3.3)

## 2016-03-21 LAB — COMPREHENSIVE METABOLIC PANEL
ANION GAP: 11 meq/L (ref 3–11)
AST: 14 U/L (ref 5–34)
Albumin: 3.9 g/dL (ref 3.5–5.0)
Alkaline Phosphatase: 63 U/L (ref 40–150)
BILIRUBIN TOTAL: 0.39 mg/dL (ref 0.20–1.20)
BUN: 14.5 mg/dL (ref 7.0–26.0)
CALCIUM: 9.8 mg/dL (ref 8.4–10.4)
CO2: 27 mEq/L (ref 22–29)
CREATININE: 0.8 mg/dL (ref 0.7–1.3)
Chloride: 102 mEq/L (ref 98–109)
EGFR: >90 mL/min/{1.73_m2} (ref >90)
Glucose: 93 mg/dl (ref 70–140)
Potassium: 3.5 mEq/L (ref 3.5–5.1)
Sodium: 140 mEq/L (ref 136–145)
TOTAL PROTEIN: 7.5 g/dL (ref 6.4–8.3)

## 2016-03-21 LAB — MAGNESIUM: MAGNESIUM: 2 mg/dL (ref 1.5–2.5)

## 2016-03-21 MED ORDER — MORPHINE SULFATE 15 MG PO TABS: 15.0000 mg | ORAL_TABLET | ORAL | Status: DC | PRN

## 2016-03-21 MED ORDER — CETUXIMAB CHEMO IV INJECTION 200 MG/100ML
250.0000 mg/m2 | Freq: Once | INTRAVENOUS | Status: AC
  Administered 2016-03-21: 500 mg via INTRAVENOUS
  Filled 2016-03-21: qty 100

## 2016-03-21 MED ORDER — JEVITY 1.5 CAL/FIBER PO LIQD: ORAL | Status: AC

## 2016-03-21 MED ORDER — DIPHENHYDRAMINE HCL 50 MG/ML IJ SOLN
50.0000 mg | Freq: Once | INTRAMUSCULAR | Status: AC
  Administered 2016-03-21: 50 mg via INTRAVENOUS

## 2016-03-21 MED ORDER — HYDROMORPHONE HCL 4 MG/ML IJ SOLN
INTRAMUSCULAR | Status: AC
  Filled 2016-03-21: qty 1

## 2016-03-21 MED ORDER — SODIUM CHLORIDE 0.9 % IJ SOLN
10.0000 mL | INTRAMUSCULAR | Status: DC | PRN
  Administered 2016-03-21: 10 mL
  Filled 2016-03-21: qty 10

## 2016-03-21 MED ORDER — DIPHENHYDRAMINE HCL 50 MG/ML IJ SOLN
INTRAMUSCULAR | Status: AC
  Filled 2016-03-21: qty 1

## 2016-03-21 MED ORDER — HEPARIN SOD (PORK) LOCK FLUSH 100 UNIT/ML IV SOLN
500.0000 [IU] | Freq: Once | INTRAVENOUS | Status: AC | PRN
  Administered 2016-03-21: 500 [IU]
  Filled 2016-03-21: qty 5

## 2016-03-21 MED ORDER — HYDROMORPHONE HCL 4 MG/ML IJ SOLN
2.0000 mg | INTRAMUSCULAR | Status: DC | PRN
Start: 2016-03-21 — End: 2016-03-21
  Administered 2016-03-21: 2 mg via INTRAVENOUS

## 2016-03-21 MED ORDER — OSMOLITE 1.2 CAL PO LIQD: ORAL | Status: DC

## 2016-03-21 MED ORDER — MORPHINE SULFATE ER 30 MG PO TBCR: 30.0000 mg | EXTENDED_RELEASE_TABLET | Freq: Two times a day (BID) | ORAL | Status: DC

## 2016-03-21 MED ORDER — SODIUM CHLORIDE 0.9 % IV SOLN
Freq: Once | INTRAVENOUS | Status: AC
  Administered 2016-03-21: 10:00:00 via INTRAVENOUS

## 2016-03-21 MED ORDER — LORAZEPAM 1 MG PO TABS: 1.0000 mg | ORAL_TABLET | Freq: Two times a day (BID) | ORAL | Status: DC | PRN

## 2016-03-21 MED ORDER — SODIUM CHLORIDE 0.9 % IV SOLN
Freq: Once | INTRAVENOUS | Status: AC
  Administered 2016-03-21: 12:00:00 via INTRAVENOUS
  Filled 2016-03-21: qty 4

## 2016-03-21 MED FILL — MORPHINE SULFATE IR 15 MG T: 15 | 5 days supply | Qty: 30 | Fill #0

## 2016-03-21 MED FILL — LORazepam 1 MG TABS: 1 | 5 days supply | Qty: 10 | Fill #0

## 2016-03-21 MED FILL — LEVOTHYROXINE 25 MCG TABLET: 25 | 30 days supply | Qty: 30 | Fill #2

## 2016-03-21 NOTE — Progress Notes (Signed)
Nutrition follow-up completed with patient during chemotherapy for cancer of the supraglottis. Last weight documented was 152.8 pounds on March 20. Labs were reviewed. Patient reports he had increased nausea and therefore decreased total amount of tube feeding.  Reports bowel movements are softer and more often if he just uses Osmolite 1.2. Patient would like to use combination of Osmolite 1.2 and Jevity 1.5 as previously discussed.  Estimated nutrition needs: 2300-2500 calories, 95-110 grams protein, 2.3 L fluid.  Nutrition diagnosis: Unintended weight loss cannot be evaluated.  Intervention: Patient will utilize 4 cans Osmolite 1.2+ 4 cans Jevity 1.5 to total 8 cans daily providing 2560 cal, 113.2 g protein, 2220 mL free water. Encouraged patient to take nausea medication as prescribed. Will write tube feeding orders and notify [facility] of tube feeding change.  Monitoring, evaluation, goals: Patient will tolerate tube feeding at goal rate to minimize weight loss.  Next visit: Monday, April 17, during infusion.  **Disclaimer: This note was dictated with voice recognition software. Similar sounding words can inadvertently be transcribed and this note may contain transcription errors which may not have been corrected upon publication of note.**

## 2016-03-21 NOTE — Patient Instructions (Signed)
[facility] Discharge Instructions for Patients Receiving Chemotherapy  Today you received the following chemotherapy agents Erbitux To help prevent nausea and vomiting after your treatment, we encourage you to take your nausea medication as prescribed.   If you develop nausea and vomiting that is not controlled by your nausea medication, call the clinic.   BELOW ARE SYMPTOMS THAT SHOULD BE REPORTED IMMEDIATELY:  *FEVER GREATER THAN 100.5 F  *CHILLS WITH OR WITHOUT FEVER  NAUSEA AND VOMITING THAT IS NOT CONTROLLED WITH YOUR NAUSEA MEDICATION  *UNUSUAL SHORTNESS OF BREATH  *UNUSUAL BRUISING OR BLEEDING  TENDERNESS IN MOUTH AND THROAT WITH OR WITHOUT PRESENCE OF ULCERS  *URINARY PROBLEMS  *BOWEL PROBLEMS  UNUSUAL RASH Items with * indicate a potential emergency and should be followed up as soon as possible.  Feel free to call the clinic you have any questions or concerns. The clinic phone number is [phone].  Please show the CHEMO ALERT CARD at check-in to the Emergency Department and triage nurse.   

## 2016-03-21 NOTE — Progress Notes (Signed)
Patient c/o facial pain, pressure, per dr Alvy Bimler, given script for long action and short acting analgesics and script for ativan to take prior to scans.

## 2016-03-22 ENCOUNTER — Encounter: Payer: Self-pay | Admitting: Hematology and Oncology

## 2016-03-22 NOTE — Progress Notes (Signed)
Left nctracks form for dr. Alvy Bimler to sign for morphine

## 2016-03-24 ENCOUNTER — Telehealth: Payer: Self-pay | Admitting: *Deleted

## 2016-03-24 NOTE — Telephone Encounter (Signed)
Called pt to let him know we are working on his PET scan.  Let him know it is approved now but cannot be done tomorrow d/t they were not able to order his Radio isotope in time.  Pt understands and says they did call him and r/s PET to next Friday 4/21.  Informed him I have asked Radiology Director to move it up sooner next week so they hopefully will be calling him to get it done sooner than Friday.  Pt states understanding.  He reports ongoing Pain/pressure in right face.   Rx for MS contin requires prior auth and has not been approved yet.  He was able to fill the MS IR but says it "doesn't help" his pain at all.  He says he will wait to hear about his PET and if his MS contin will be approved, but he is not convinced it will help his pain since the MS IR doesn't seem to help.

## 2016-03-24 NOTE — Telephone Encounter (Signed)
Pt's PET was canceled for tomorrow d/t denied by insurance.   I called Marita Kansas in managed care at [facility] phone [phone].   She called back and says it has now been approved.  But now it is after 3 pm and Radiology Dept cannot get the Radio-Isotope they need for PET tomorrow.  I s/w Imagene Riches, Director of Radiology.  She says there is no way to get PET done tomorrow since Prior Approval was not done until after 3 pm and they have to order the radio isotope before 3pm.   She will call back tomorrow to let us know the soonest it can be done next week.

## 2016-03-24 NOTE — Telephone Encounter (Signed)
Telephone call from patient, radiology called him to say that pet scan was denied by his insurance. Patient would like a return call about this.

## 2016-03-25 ENCOUNTER — Telehealth: Payer: Self-pay | Admitting: *Deleted

## 2016-03-25 ENCOUNTER — Ambulatory Visit (HOSPITAL_COMMUNITY): Payer: Medicaid Other

## 2016-03-25 NOTE — Telephone Encounter (Signed)
Tammi, PLease tell him to double the dose of his IR morphine

## 2016-03-25 NOTE — Telephone Encounter (Signed)
Pt to come Monday morning at 0700 for his PET, NPO after MN.  Instructed to double dose of MS IR per Dr Alvy Bimler while we are trying to get MS Contin approval. Verbalized understanding

## 2016-03-25 NOTE — Telephone Encounter (Signed)
Kyle Alexander, this is a mess. PLease stop by my office to discuss this

## 2016-03-28 ENCOUNTER — Telehealth: Payer: Self-pay | Admitting: Hematology and Oncology

## 2016-03-28 ENCOUNTER — Ambulatory Visit: Payer: Medicaid Other

## 2016-03-28 ENCOUNTER — Encounter: Payer: Self-pay | Admitting: Hematology and Oncology

## 2016-03-28 ENCOUNTER — Ambulatory Visit (HOSPITAL_BASED_OUTPATIENT_CLINIC_OR_DEPARTMENT_OTHER): Payer: Medicaid Other | Admitting: Hematology and Oncology

## 2016-03-28 ENCOUNTER — Telehealth: Payer: Self-pay | Admitting: *Deleted

## 2016-03-28 ENCOUNTER — Encounter (HOSPITAL_COMMUNITY)
Admission: RE | Admit: 2016-03-28 | Discharge: 2016-03-28 | Disposition: A | Discharge status: Home / Self Care | Payer: Medicaid Other | Source: Ambulatory Visit | Attending: Hematology and Oncology | Admitting: Hematology and Oncology

## 2016-03-28 ENCOUNTER — Encounter: Payer: Medicaid Other | Admitting: Nutrition

## 2016-03-28 ENCOUNTER — Other Ambulatory Visit: Payer: Self-pay

## 2016-03-28 VITALS — BP 113/74 | HR 69 | Temp 97.9°F | Resp 18 | Ht 70.0 in | Wt 142.6 lb

## 2016-03-28 DIAGNOSIS — G893 Neoplasm related pain (acute) (chronic): Secondary | ICD-10-CM | POA: Diagnosis not present

## 2016-03-28 DIAGNOSIS — C321 Malignant neoplasm of supraglottis: Secondary | ICD-10-CM | POA: Diagnosis not present

## 2016-03-28 DIAGNOSIS — E43 Unspecified severe protein-calorie malnutrition: Secondary | ICD-10-CM | POA: Diagnosis not present

## 2016-03-28 DIAGNOSIS — R5383 Other fatigue: Secondary | ICD-10-CM

## 2016-03-28 DIAGNOSIS — Z720 Tobacco use: Secondary | ICD-10-CM | POA: Diagnosis not present

## 2016-03-28 HISTORY — DX: Other fatigue: R53.83

## 2016-03-28 LAB — GLUCOSE, CAPILLARY: Glucose-Capillary: 91 mg/dL (ref 65–99)

## 2016-03-28 MED ORDER — PREDNISONE 20 MG PO TABS: 20.0000 mg | ORAL_TABLET | Freq: Every day | ORAL | Status: DC

## 2016-03-28 MED ORDER — FLUDEOXYGLUCOSE F - 18 (FDG) INJECTION
7.5000 | Freq: Once | INTRAVENOUS | Status: AC | PRN
  Administered 2016-03-28: 7.5 via INTRAVENOUS

## 2016-03-28 MED ORDER — MORPHINE SULFATE 30 MG PO TABS: 30.0000 mg | ORAL_TABLET | ORAL | Status: DC | PRN

## 2016-03-28 MED FILL — predniSONE 20 MG TABS: 20 | 20 days supply | Qty: 20 | Fill #0

## 2016-03-28 MED FILL — MORPHINE SULFATE IR 30 MG T: 30 | 15 days supply | Qty: 90 | Fill #0

## 2016-03-28 NOTE — Assessment & Plan Note (Signed)
He has severe cancer associated pain. Unfortunately, his insurance has not approved long-acting morphine sulfate. I will increase IR morphine to 30 mg to take every 4 hours as needed along with low-dose prednisone for pain secondary to swelling and inflammation. I will reassess pain control next week.

## 2016-03-28 NOTE — Assessment & Plan Note (Signed)
I continue to encourage him to quit smoking.  

## 2016-03-28 NOTE — Progress Notes (Signed)
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[place])   03/15/2015 - 03/17/2015 Hospital Admission He was admitted to the hospital for evaluation of dysphagia, SOB, hemoptosis, hoarseness, 30-40 pound weight loss and worsening bilateral neck masses for 5 months   03/15/2015 Imaging Ct showed extensive circumferential malignancy in the hypopharyngeal/supraglottic region with regional LN metastases   03/16/2015 Procedure He underwent ULTRASOUND-GUIDED BIOPSY OF LEFT CERVICAL LYMPH NODES   03/16/2015 Pathology Results Accession: [id number] LN biopsy showed invasive squamous cell cancer   03/16/2015 Pathology Results Accession: [id number] showed atypical squamous cells   03/25/2015 - 04/07/2015 Hospital Admission He was admitted to the hospital and underwent tracheostomy placement, feeeding tube placement but subsequently left [facility]   03/26/2015 Surgery He had multiple extraction of tooth numbers 1, 2, 5, 6, 7, 8, 9, 10, 11, 12, 13, 18, 19, 21, 22, 23, 24, 25, 26, 27, 28, and 29. and 4 Quadrants of alveoloplasty   03/26/2015 Surgery He underwent tracheostomy   03/31/2015 Surgery He had open gastrostomy tube placement by Dr. Donne Hazel   04/16/2015 - 05/18/2015 Radiation Therapy Laryngopharynx and bilateral neck / 50 Gy in 20 fractions to gross disease, 45 Gy in 20 fractions to high risk nodal echelons  Beams/energy: Helical IMRT / 6 MV photons   04/16/2015 Procedure Fluoroscopic reposition of the 18 French gastrostomy confirmed back in the stomach,   07/03/2015 Procedure IR performed replacement of gastrostomy tube with a new 99 French balloon retention tube   10/01/2015 Imaging PEt  scan showed persistent hypermetabolism within the primary supraglottic laryngeal tumor and within right retropharyngeal, bilateral level II and left level IV cervical nodal metastases   10/28/2015 Procedure He had placement of PICC line. The IR was not able to place PORT due to suspected upper respiratory infection   11/13/2015 -  Chemotherapy He received 5FU, carboplatin chemo with weekly Erbitux   11/19/2015 Surgery Gastrostomy tube replaced.   11/30/2015 Procedure Placement of right jugular port-a-cath.   11/30/2015 Procedure PICC removed.   12/30/2015 Imaging PET CT showed positive response to Rx   02/26/2016 Procedure He underwent direct laryngoscopy with biopsy. Esophageal dilatation.    02/26/2016 Pathology Results Repeat biopsy of supraglottis showed persistent disease   02/29/2016 Procedure Gastrostomy tube exchanged.    INTERVAL HISTORY: Please see below for problem oriented charting. He returns this morning to review results of the PET CT scan In the meantime, he is complaining of severe 8 out of 10 pain. He could not fill his prescription of MS Contin. Despite increasing IR morphine to 2 tablets, he remained remain poorly controlled. The patient is situated towards the right cheek area and in his throat He continues to smoke. He has no further nausea or vomiting but has some mild diarrhea from Reglan He has lost some weight recently due to uncontrolled pain REVIEW OF SYSTEMS:   Constitutional: Denies fevers, chills  Eyes: Denies blurriness of vision Respiratory: Denies cough, dyspnea or wheezes Cardiovascular: Denies palpitation, chest discomfort or lower extremity swelling Skin: Denies abnormal skin rashes Lymphatics: Denies new lymphadenopathy or easy bruising Neurological:Denies numbness, tingling or new weaknesses Behavioral/Psych: Mood is stable, no new changes  All other systems were reviewed with the patient  and are negative.  I have reviewed the past medical history, past  surgical history, social history and family history with the patient and they are unchanged from previous note.  ALLERGIES:  is allergic to aspirin.  MEDICATIONS:  Current Outpatient Prescriptions  Medication Sig Dispense Refill  . fluticasone (FLONASE) 50 MCG/ACT nasal spray Place 2 sprays into both nostrils daily. 16 g 2  . guaiFENesin (ROBITUSSIN) 100 MG/5ML SOLN Take 5 mLs (100 mg total) by mouth every 4 (four) hours as needed for cough or to loosen phlegm. 473 mL 3  . lansoprazole (PREVACID) 30 MG capsule Take 1 capsule (30 mg total) by mouth 2 (two) times daily before a meal. 60 capsule 6  . levothyroxine (LEVOTHROID) 25 MCG tablet Take 1 tablet (25 mcg total) by mouth daily before breakfast. 30 tablet 3  . lidocaine (XYLOCAINE) 2 % solution Use as directed 15 mLs in the mouth or throat every 8 (eight) hours as needed for mouth pain. 100 mL 0  . LORazepam (ATIVAN) 0.5 MG tablet Take 1-2 tablets before PET/ CT Scans. 6 tablet 0  . LORazepam (ATIVAN) 1 MG tablet Take 1 tablet (1 mg total) by mouth 2 (two) times daily as needed for anxiety (or nausea). 10 tablet 0  . metoCLOPramide (REGLAN) 5 MG/5ML solution Place 5 mLs (5 mg total) into feeding tube 4 (four) times daily -  before meals and at bedtime. 240 mL 1  . morphine (MS CONTIN) 30 MG 12 hr tablet Take 1 tablet (30 mg total) by mouth every 12 (twelve) hours. 30 tablet 0  . morphine (MSIR) 30 MG tablet Take 1 tablet (30 mg total) by mouth every 4 (four) hours as needed for severe pain. 90 tablet 0  . nicotine (NICODERM CQ) 7 mg/24hr patch apply 7 mg patch daily x 4 wks after quitting smoking 14 patch 6  . Nutritional Supplements (FEEDING SUPPLEMENT, OSMOLITE 1.2 CAL,) LIQD Give 1 can Osmolite 1.2 + 1 can of Jevity 1.5 QID via PEG with 60 cc free water before and after bolus. Flush with 240 cc free water BID between feedings. 948 mL 0  . omeprazole (PRILOSEC) 20 MG capsule Take 1 capsule (20 mg total) by mouth 2 (two) times daily. 60  capsule 0  . ondansetron (ZOFRAN) 8 MG tablet Take 1 tablet (8 mg total) by mouth every 8 (eight) hours as needed for nausea. 60 tablet 3  . oxyCODONE (ROXICODONE) 5 MG/5ML solution Take 5 mLs (5 mg total) by mouth every 6 (six) hours as needed. 473 mL 0  . polyethylene glycol (MIRALAX) packet Take 17 g by mouth daily. 14 each 0  . SODIUM CHLORIDE, EXTERNAL, 0.9 % SOLN Use to clean around Trach and perform Trach care once daily and PRN 1000 mL 11  . zolpidem (AMBIEN) 5 MG tablet Take 1 tablet (5 mg total) by mouth at bedtime as needed for sleep. 30 tablet 0  . Nutritional Supplements (FEEDING SUPPLEMENT, JEVITY 1.5 CAL/FIBER,) LIQD Give 1 can Osmolite 1.2 + 1 can of Jevity 1.5 QID via PEG with 60 cc free water before and after bolus. Flush with 240 cc free water BID between feedings. 948 mL   . predniSONE (DELTASONE) 20 MG tablet Take 1 tablet (20 mg total) by mouth daily with breakfast. 20 tablet 0   No current facility-administered medications for this visit.    PHYSICAL EXAMINATION: ECOG PERFORMANCE STATUS: 1 - Symptomatic but completely ambulatory  Filed Vitals:   03/28/16 0922  BP: 113/74  Pulse: 69  Temp: 97.9 F (36.6 C)  Resp: 18   Filed Weights   03/28/16 0922  Weight: 142 lb 9.6 oz (64.683 kg)    GENERAL:alert, no distress and comfortable. He appears depressed SKIN: skin color, texture, turgor are normal, no rashes or significant lesions EYES: normal, Conjunctiva are pink and non-injected, sclera clear OROPHARYNX:no exudate, no erythema and lips, buccal mucosa, and tongue normal  NECK: Noted tracheostomy in situ  NEURO: alert & oriented x 3 with fluent speech, no focal motor/sensory deficits  LABORATORY DATA:  I have reviewed the data as listed    Component Value Date/Time   NA 140 03/21/2016 0820   NA 140 01/02/2016 1433   K 3.5 03/21/2016 0820   K 4.3 01/02/2016 1433   CL 99* 01/02/2016 1433   CO2 27 03/21/2016 0820   CO2 29 01/02/2016 1422   GLUCOSE 93  03/21/2016 0820   GLUCOSE 117* 01/02/2016 1433   BUN 14.5 03/21/2016 0820   BUN 17 01/02/2016 1433   CREATININE 0.8 03/21/2016 0820   CREATININE 0.80 01/02/2016 1433   CALCIUM 9.8 03/21/2016 0820   CALCIUM 9.8 01/02/2016 1422   PROT 7.5 03/21/2016 0820   PROT 7.4 01/02/2016 1422   ALBUMIN 3.9 03/21/2016 0820   ALBUMIN 3.9 01/02/2016 1422   AST 14 03/21/2016 0820   AST 28 01/02/2016 1422   ALT <9 03/21/2016 0820   ALT 15* 01/02/2016 1422   ALKPHOS 63 03/21/2016 0820   ALKPHOS 56 01/02/2016 1422   BILITOT 0.39 03/21/2016 0820   BILITOT 0.8 01/02/2016 1422   GFRNONAA >60 01/02/2016 1422   GFRAA >60 01/02/2016 1422    No results found for: SPEP, UPEP  Lab Results  Component Value Date   WBC 6.6 03/21/2016   NEUTROABS 5.4 03/21/2016   HGB 11.8* 03/21/2016   HCT 34.7* 03/21/2016   MCV 114.9* 03/21/2016   PLT 170 03/21/2016      Chemistry      Component Value Date/Time   NA 140 03/21/2016 0820   NA 140 01/02/2016 1433   K 3.5 03/21/2016 0820   K 4.3 01/02/2016 1433   CL 99* 01/02/2016 1433   CO2 27 03/21/2016 0820   CO2 29 01/02/2016 1422   BUN 14.5 03/21/2016 0820   BUN 17 01/02/2016 1433   CREATININE 0.8 03/21/2016 0820   CREATININE 0.80 01/02/2016 1433      Component Value Date/Time   CALCIUM 9.8 03/21/2016 0820   CALCIUM 9.8 01/02/2016 1422   ALKPHOS 63 03/21/2016 0820   ALKPHOS 56 01/02/2016 1422   AST 14 03/21/2016 0820   AST 28 01/02/2016 1422   ALT <9 03/21/2016 0820   ALT 15* 01/02/2016 1422   BILITOT 0.39 03/21/2016 0820   BILITOT 0.8 01/02/2016 1422      RADIOGRAPHIC STUDIES:PET/CT scan is pending but I reviewed the scan images with the patient, comparing with the PET/CT scan from January ASSESSMENT & PLAN:  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 I would have to switch his treatment. Role of treatment is palliative and it is a recommendation according to NCCN  guidelines  On July 17, 2015, the U. S. Food and Drug Administration granted accelerated approval to pembrolizumab for the treatment of patients with recurrent or metastatic head and neck squamous cell carcinoma (HNSCC) with disease progression on or after platinum-containing chemotherapy.   The approval was based on demonstration of a durable objective response rate (ORR) in  a subgroup of patients in an international, multicenter, non-randomized, open-label, multi-cohort study. This subgroup included 174 patients with recurrent or metastatic HNSCC who had disease progression on or after platinum-containing chemotherapy. Patients received intravenous pembrolizumab 10 mg/kg every 2 weeks or 200 mg every 3 weeks.   ORR was determined by an independent review committee according to Response Evaluation Criteria in Solid Tumors (RECIST) 1.1. The ORR for these 174 patients was 16% (95% confidence interval [CI] 11, 22). The median response duration had not been reached at the time of analysis. The range for duration of response was 2.4 months to 27.7 months (response ongoing). Among the 28 responding patients, 23 (82%) had responses of 6 months or longer.   Safety data was evaluated in 192 patients with HNSCC receiving at least one dose of pembrolizumab 10 mg/kg every 2 weeks or 200 mg every 3 weeks. The most common (greater than or equal to 20%) adverse reactions were fatigue, decreased appetite, and dyspnea. Adverse reactions occurring in patients with HNSCC were similar to those occurring in patients with melanoma or NSCLC, with the exception of an increased incidence of facial edema (10% all grades, 2.1% grades 3-4) and new or worsening hypothyroidism (14.6% all grades). The most frequent (greater than or equal to 2%) serious adverse reactions were pneumonia, dyspnea, confusional state, vomiting, pleural effusion, and respiratory failure. Clinically significant immune-mediated adverse reactions included  pneumonitis, colitis, hepatitis, adrenal insufficiency, diabetes mellitus, skin toxicity, myositis, and thyroid disorders.   The recommended dose and schedule of pembrolizumab for this indication is 200 mg administered as an intravenous infusion over 30 minutes every 3 weeks.   We discussed some of the risks, benefits, side-effects of Pembrolizumab  Some of the short term side-effects included, though not limited to, risk of fatigue, pancytopenia, life-threatening infections, allergic reactions, nausea, vomiting, hepatitis, changes in bowel habits especially diarrhea, admission to hospital for various reasons, and risks of death.   The patient is aware that the response rates discussed earlier is not guaranteed.    After a long discussion, patient made an informed decision to proceed.   Patient education material was dispensed. I will cancel his treatment today and schedule his first treatment to start next week. 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 Unfortunately, his insurance has not approved long-acting morphine sulfate. I will increase IR morphine to 30 mg to take every 4 hours as needed along with low-dose prednisone for pain secondary to swelling and inflammation. I will reassess pain control next week.   Protein-calorie malnutrition, severe ([place]) He has cancer cachexia. I will start him on low-dose prednisone to help with pain and also see if we can help with appetite. We will get dietitian to follow closely. Continue to encourage him to take nutritional supplements on the regular basis  Tobacco abuse I continue to encourage him to quit smoking.    Orders Placed This Encounter  Procedures  . CBC with Differential    Standing Status: Standing     Number of Occurrences: 20     Standing Expiration Date: 03/29/2017  . Comprehensive metabolic panel    Standing Status: Standing     Number of  Occurrences: 20     Standing Expiration Date: 03/29/2017  . TSH    Standing Status: Standing     Number of Occurrences: 55     Standing Expiration Date: 03/28/2017   All questions were answered. The patient knows  to call the clinic with any problems, questions or concerns. No barriers to learning was detected. I spent 25 minutes counseling the patient face to face. The total time spent in the appointment was 40 minutes and more than 50% was on counseling and review of test results     [facility], Lavontae Cornia, MD 03/28/2016 1:12 PM

## 2016-03-28 NOTE — Assessment & Plan Note (Signed)
Unfortunately, PET/CT scan and recent biopsy confirmed persistent disease. I would have to switch his treatment. Role of treatment is palliative and it is a recommendation according to NCCN guidelines  On July 17, 2015, the U. S. Food and Drug Administration granted accelerated approval to pembrolizumab for the treatment of patients with recurrent or metastatic head and neck squamous cell carcinoma (HNSCC) with disease progression on or after platinum-containing chemotherapy.   The approval was based on demonstration of a durable objective response rate (ORR) in a subgroup of patients in an international, multicenter, non-randomized, open-label, multi-cohort study. This subgroup included 174 patients with recurrent or metastatic HNSCC who had disease progression on or after platinum-containing chemotherapy. Patients received intravenous pembrolizumab 10 mg/kg every 2 weeks or 200 mg every 3 weeks.   ORR was determined by an independent review committee according to Response Evaluation Criteria in Solid Tumors (RECIST) 1.1. The ORR for these 174 patients was 16% (95% confidence interval [CI] 11, 22). The median response duration had not been reached at the time of analysis. The range for duration of response was 2.4 months to 27.7 months (response ongoing). Among the 28 responding patients, 23 (82%) had responses of 6 months or longer.   Safety data was evaluated in 192 patients with HNSCC receiving at least one dose of pembrolizumab 10 mg/kg every 2 weeks or 200 mg every 3 weeks. The most common (greater than or equal to 20%) adverse reactions were fatigue, decreased appetite, and dyspnea. Adverse reactions occurring in patients with HNSCC were similar to those occurring in patients with melanoma or NSCLC, with the exception of an increased incidence of facial edema (10% all grades, 2.1% grades 3-4) and new or worsening hypothyroidism (14.6% all grades). The most frequent (greater than or equal to 2%)  serious adverse reactions were pneumonia, dyspnea, confusional state, vomiting, pleural effusion, and respiratory failure. Clinically significant immune-mediated adverse reactions included pneumonitis, colitis, hepatitis, adrenal insufficiency, diabetes mellitus, skin toxicity, myositis, and thyroid disorders.   The recommended dose and schedule of pembrolizumab for this indication is 200 mg administered as an intravenous infusion over 30 minutes every 3 weeks.   We discussed some of the risks, benefits, side-effects of Pembrolizumab  Some of the short term side-effects included, though not limited to, risk of fatigue, pancytopenia, life-threatening infections, allergic reactions, nausea, vomiting, hepatitis, changes in bowel habits especially diarrhea, admission to hospital for various reasons, and risks of death.   The patient is aware that the response rates discussed earlier is not guaranteed.    After a long discussion, patient made an informed decision to proceed.   Patient education material was dispensed. I will cancel his treatment today and schedule his first treatment to start next week. I will see him next week to assess pain management

## 2016-03-28 NOTE — Telephone Encounter (Signed)
Pt aware of appts on 4.24...the patient did not want print out

## 2016-03-28 NOTE — Patient Instructions (Signed)
Pembrolizumab injection What is this medicine? PEMBROLIZUMAB (pem broe liz ue mab) is a monoclonal antibody. It is used to treat melanoma and non-small cell lung cancer. This medicine may be used for other purposes; ask your health care provider or pharmacist if you have questions. What should I tell my health care provider before I take this medicine? They need to know if you have any of these conditions: -diabetes -immune system problems -inflammatory bowel disease -liver disease -lung or breathing disease -lupus -an unusual or allergic reaction to pembrolizumab, other medicines, foods, dyes, or preservatives -pregnant or trying to get pregnant -breast-feeding How should I use this medicine? This medicine is for infusion into a vein. It is given by a health care professional in a hospital or clinic setting. A special MedGuide will be given to you before each treatment. Be sure to read this information carefully each time. Talk to your pediatrician regarding the use of this medicine in children. Special care may be needed. Overdosage: If you think you have taken too much of this medicine contact a poison control center or emergency room at once. NOTE: This medicine is only for you. Do not share this medicine with others. What if I miss a dose? It is important not to miss your dose. Call your doctor or health care professional if you are unable to keep an appointment. What may interact with this medicine? Interactions have not been studied. Give your health care provider a list of all the medicines, herbs, non-prescription drugs, or dietary supplements you use. Also tell them if you smoke, drink alcohol, or use illegal drugs. Some items may interact with your medicine. This list may not describe all possible interactions. Give your health care provider a list of all the medicines, herbs, non-prescription drugs, or dietary supplements you use. Also tell them if you smoke, drink alcohol, or  use illegal drugs. Some items may interact with your medicine. What should I watch for while using this medicine? Your condition will be monitored carefully while you are receiving this medicine. You may need blood work done while you are taking this medicine. Do not become pregnant while taking this medicine or for 4 months after stopping it. Women should inform their doctor if they wish to become pregnant or think they might be pregnant. There is a potential for serious side effects to an unborn child. Talk to your health care professional or pharmacist for more information. Do not breast-feed an infant while taking this medicine or for 4 months after the last dose. What side effects may I notice from receiving this medicine? Side effects that you should report to your doctor or health care professional as soon as possible: -allergic reactions like skin rash, itching or hives, swelling of the face, lips, or tongue -bloody or black, tarry stools -breathing problems -change in the amount of urine -changes in vision -chest pain -chills -dark urine -dizziness or feeling faint or lightheaded -fast or irregular heartbeat -fever -flushing -hair loss -muscle pain -muscle weakness -persistent headache -signs and symptoms of high blood sugar such as dizziness; dry mouth; dry skin; fruity breath; nausea; stomach pain; increased hunger or thirst; increased urination -signs and symptoms of liver injury like dark urine, light-colored stools, loss of appetite, nausea, right upper belly pain, yellowing of the eyes or skin -stomach pain -weight loss Side effects that usually do not require medical attention (Report these to your doctor or health care professional if they continue or are bothersome.):constipation -cough -diarrhea -joint pain -  tiredness This list may not describe all possible side effects. Call your doctor for medical advice about side effects. You may report side effects to FDA at  1-800-FDA-1088. Where should I keep my medicine? This drug is given in a hospital or clinic and will not be stored at home. NOTE: This sheet is a summary. It may not cover all possible information. If you have questions about this medicine, talk to your doctor, pharmacist, or health care provider.    2016, [employer]vier/Gold Standard. (2015-01-27 17:24:19)  

## 2016-03-28 NOTE — Telephone Encounter (Signed)
Per staff message and POF I have scheduled appts. Advised scheduler of appts. JMW  

## 2016-03-28 NOTE — Assessment & Plan Note (Signed)
He has cancer cachexia. I will start him on low-dose prednisone to help with pain and also see if we can help with appetite. We will get dietitian to follow closely. Continue to encourage him to take nutritional supplements on the regular basis

## 2016-03-31 ENCOUNTER — Encounter: Payer: Self-pay | Admitting: Hematology and Oncology

## 2016-03-31 NOTE — Progress Notes (Signed)
see notes dr. Ricard Dillon script will do new nctraks form for morphine to every 4 hours. Last said every 12hrs. See notes.

## 2016-04-01 ENCOUNTER — Ambulatory Visit (HOSPITAL_COMMUNITY): Payer: Medicaid Other

## 2016-04-01 ENCOUNTER — Encounter: Payer: Self-pay | Admitting: Hematology and Oncology

## 2016-04-01 NOTE — Progress Notes (Signed)
left nctraks for dr to sign. see notes dr. Ricard Dillon script will do new nctraks form-faxed  [id number]

## 2016-04-04 ENCOUNTER — Ambulatory Visit: Payer: Medicaid Other

## 2016-04-04 ENCOUNTER — Encounter: Payer: Self-pay | Admitting: Hematology and Oncology

## 2016-04-04 ENCOUNTER — Ambulatory Visit (HOSPITAL_BASED_OUTPATIENT_CLINIC_OR_DEPARTMENT_OTHER): Payer: Medicaid Other

## 2016-04-04 ENCOUNTER — Ambulatory Visit (HOSPITAL_BASED_OUTPATIENT_CLINIC_OR_DEPARTMENT_OTHER): Payer: Medicaid Other | Admitting: Hematology and Oncology

## 2016-04-04 ENCOUNTER — Other Ambulatory Visit: Payer: Self-pay

## 2016-04-04 ENCOUNTER — Ambulatory Visit: Payer: Medicaid Other | Admitting: Nutrition

## 2016-04-04 ENCOUNTER — Telehealth: Payer: Self-pay | Admitting: Hematology and Oncology

## 2016-04-04 ENCOUNTER — Other Ambulatory Visit (HOSPITAL_BASED_OUTPATIENT_CLINIC_OR_DEPARTMENT_OTHER): Payer: Medicaid Other

## 2016-04-04 VITALS — BP 106/78 | HR 75 | Temp 98.3°F | Resp 16 | Ht 70.0 in | Wt 146.4 lb

## 2016-04-04 DIAGNOSIS — G893 Neoplasm related pain (acute) (chronic): Secondary | ICD-10-CM

## 2016-04-04 DIAGNOSIS — T451X5A Adverse effect of antineoplastic and immunosuppressive drugs, initial encounter: Secondary | ICD-10-CM

## 2016-04-04 DIAGNOSIS — R12 Heartburn: Secondary | ICD-10-CM

## 2016-04-04 DIAGNOSIS — E43 Unspecified severe protein-calorie malnutrition: Secondary | ICD-10-CM

## 2016-04-04 DIAGNOSIS — C321 Malignant neoplasm of supraglottis: Secondary | ICD-10-CM

## 2016-04-04 DIAGNOSIS — Z5112 Encounter for antineoplastic immunotherapy: Secondary | ICD-10-CM

## 2016-04-04 DIAGNOSIS — C770 Secondary and unspecified malignant neoplasm of lymph nodes of head, face and neck: Secondary | ICD-10-CM

## 2016-04-04 DIAGNOSIS — D61810 Antineoplastic chemotherapy induced pancytopenia: Secondary | ICD-10-CM

## 2016-04-04 DIAGNOSIS — Z720 Tobacco use: Secondary | ICD-10-CM

## 2016-04-04 DIAGNOSIS — R5383 Other fatigue: Secondary | ICD-10-CM

## 2016-04-04 LAB — COMPREHENSIVE METABOLIC PANEL
ALBUMIN: 3.7 g/dL (ref 3.5–5.0)
ALK PHOS: 58 U/L (ref 40–150)
ANION GAP: 10 meq/L (ref 3–11)
AST: 11 U/L (ref 5–34)
BUN: 9.5 mg/dL (ref 7.0–26.0)
CALCIUM: 9.7 mg/dL (ref 8.4–10.4)
CHLORIDE: 100 meq/L (ref 98–109)
CO2: 29 mEq/L (ref 22–29)
Creatinine: 0.7 mg/dL (ref 0.7–1.3)
EGFR: >90 mL/min/{1.73_m2} (ref >90)
Glucose: 96 mg/dl (ref 70–140)
POTASSIUM: 3.3 meq/L — AB (ref 3.5–5.1)
Sodium: 139 mEq/L (ref 136–145)
Total Bilirubin: 0.33 mg/dL (ref 0.20–1.20)
Total Protein: 7 g/dL (ref 6.4–8.3)

## 2016-04-04 LAB — CBC WITH DIFFERENTIAL/PLATELET
BASO%: 0 % (ref 0.0–2.0)
BASOS ABS: 0 10*3/uL (ref 0.0–0.1)
EOS%: 0.3 % (ref 0.0–7.0)
Eosinophils Absolute: 0 10*3/uL (ref 0.0–0.5)
HEMATOCRIT: 35.6 % — AB (ref 38.4–49.9)
HEMOGLOBIN: 12.4 g/dL — AB (ref 13.0–17.1)
LYMPH#: 0.8 10*3/uL — AB (ref 0.9–3.3)
LYMPH%: 12.8 % — ABNORMAL LOW (ref 14.0–49.0)
MCH: 37.9 pg — AB (ref 27.2–33.4)
MCHC: 34.8 g/dL (ref 32.0–36.0)
MCV: 108.9 fL — ABNORMAL HIGH (ref 79.3–98.0)
MONO#: 0.5 10*3/uL (ref 0.1–0.9)
MONO%: 8.7 % (ref 0.0–14.0)
NEUT#: 4.8 10*3/uL (ref 1.5–6.5)
NEUT%: 78.2 % — AB (ref 39.0–75.0)
PLATELETS: 101 10*3/uL — AB (ref 140–400)
RBC: 3.27 10*6/uL — ABNORMAL LOW (ref 4.20–5.82)
RDW: 12.3 % (ref 11.0–14.6)
WBC: 6.1 10*3/uL (ref 4.0–10.3)

## 2016-04-04 LAB — TSH: TSH: 3.277 m[IU]/L (ref 0.320–4.118)

## 2016-04-04 MED ORDER — HYDROMORPHONE HCL 4 MG/ML IJ SOLN
INTRAMUSCULAR | Status: AC
  Filled 2016-04-04: qty 1

## 2016-04-04 MED ORDER — ALTEPLASE 2 MG IJ SOLR
2.0000 mg | Freq: Once | INTRAMUSCULAR | Status: DC | PRN
  Filled 2016-04-04: qty 2

## 2016-04-04 MED ORDER — HEPARIN SOD (PORK) LOCK FLUSH 100 UNIT/ML IV SOLN
500.0000 [IU] | Freq: Once | INTRAVENOUS | Status: DC | PRN
Start: 2016-04-04 — End: 2016-04-04
  Filled 2016-04-04: qty 5

## 2016-04-04 MED ORDER — SODIUM CHLORIDE 0.9 % IJ SOLN
10.0000 mL | INTRAMUSCULAR | Status: DC | PRN
  Administered 2016-04-04: 10 mL
  Filled 2016-04-04: qty 10

## 2016-04-04 MED ORDER — SODIUM CHLORIDE 0.9 % IV SOLN
200.0000 mg | Freq: Once | INTRAVENOUS | Status: AC
  Administered 2016-04-04: 200 mg via INTRAVENOUS
  Filled 2016-04-04: qty 8

## 2016-04-04 MED ORDER — HEPARIN SOD (PORK) LOCK FLUSH 100 UNIT/ML IV SOLN
500.0000 [IU] | Freq: Once | INTRAVENOUS | Status: AC | PRN
  Administered 2016-04-04: 500 [IU]
  Filled 2016-04-04: qty 5

## 2016-04-04 MED ORDER — SODIUM CHLORIDE 0.9% FLUSH
10.0000 mL | INTRAVENOUS | Status: DC | PRN
Start: 2016-04-04 — End: 2016-04-04
  Administered 2016-04-04: 10 mL
  Filled 2016-04-04: qty 10

## 2016-04-04 MED ORDER — SODIUM CHLORIDE 0.9 % IV SOLN
Freq: Once | INTRAVENOUS | Status: AC
  Administered 2016-04-04: 10:00:00 via INTRAVENOUS

## 2016-04-04 MED ORDER — HYDROMORPHONE HCL 4 MG/ML IJ SOLN
2.0000 mg | INTRAMUSCULAR | Status: DC | PRN
  Administered 2016-04-04: 2 mg via INTRAVENOUS

## 2016-04-04 MED ORDER — HEPARIN SOD (PORK) LOCK FLUSH 100 UNIT/ML IV SOLN
250.0000 [IU] | Freq: Once | INTRAVENOUS | Status: DC | PRN
  Filled 2016-04-04: qty 5

## 2016-04-04 NOTE — Patient Instructions (Signed)

## 2016-04-04 NOTE — Assessment & Plan Note (Signed)
This is likely due to recent treatment. No need transfusion needed Monitor carefully

## 2016-04-04 NOTE — Assessment & Plan Note (Signed)
He has some noncompliance due to poor understanding of pain management. I encouraged him to bring all the pill bottles so that we can go through his prescriptions and pain management. I told him to discontinue oxycodone and just use IR morphine as needed for pain control. We discussed narcotic refill policy again.

## 2016-04-04 NOTE — Patient Instructions (Addendum)
[facility] Discharge Instructions for Patients Receiving Chemotherapy  Today you received the following chemotherapy agents Keytruda To help prevent nausea and vomiting after your treatment, we encourage you to take your nausea medication as prescribed.  If you develop nausea and vomiting that is not controlled by your nausea medication, call the clinic.   BELOW ARE SYMPTOMS THAT SHOULD BE REPORTED IMMEDIATELY:  *FEVER GREATER THAN 100.5 F  *CHILLS WITH OR WITHOUT FEVER  NAUSEA AND VOMITING THAT IS NOT CONTROLLED WITH YOUR NAUSEA MEDICATION  *UNUSUAL SHORTNESS OF BREATH  *UNUSUAL BRUISING OR BLEEDING  TENDERNESS IN MOUTH AND THROAT WITH OR WITHOUT PRESENCE OF ULCERS  *URINARY PROBLEMS  *BOWEL PROBLEMS  UNUSUAL RASH Items with * indicate a potential emergency and should be followed up as soon as possible.  Feel free to call the clinic you have any questions or concerns. The clinic phone number is [phone].  Please show the [place] at check-in to the Emergency Department and triage nurse.    Pembrolizumab injection Beryle Flock) What is this medicine? PEMBROLIZUMAB (pem broe liz ue mab) is a monoclonal antibody. It is used to treat melanoma and non-small cell lung cancer. This medicine may be used for other purposes; ask your health care provider or pharmacist if you have questions. What should I tell my health care provider before I take this medicine? They need to know if you have any of these conditions: -diabetes -immune system problems -inflammatory bowel disease -liver disease -lung or breathing disease -lupus -an unusual or allergic reaction to pembrolizumab, other medicines, foods, dyes, or preservatives -pregnant or trying to get pregnant -breast-feeding How should I use this medicine? This medicine is for infusion into a vein. It is given by a health care professional in a hospital or clinic setting. A special MedGuide will be  given to you before each treatment. Be sure to read this information carefully each time. Talk to your pediatrician regarding the use of this medicine in children. Special care may be needed. Overdosage: If you think you have taken too much of this medicine contact a poison control center or emergency room at once. NOTE: This medicine is only for you. Do not share this medicine with others. What if I miss a dose? It is important not to miss your dose. Call your doctor or health care professional if you are unable to keep an appointment. What may interact with this medicine? Interactions have not been studied. Give your health care provider a list of all the medicines, herbs, non-prescription drugs, or dietary supplements you use. Also tell them if you smoke, drink alcohol, or use illegal drugs. Some items may interact with your medicine. This list may not describe all possible interactions. Give your health care provider a list of all the medicines, herbs, non-prescription drugs, or dietary supplements you use. Also tell them if you smoke, drink alcohol, or use illegal drugs. Some items may interact with your medicine. What should I watch for while using this medicine? Your condition will be monitored carefully while you are receiving this medicine. You may need blood work done while you are taking this medicine. Do not become pregnant while taking this medicine or for 4 months after stopping it. Women should inform their doctor if they wish to become pregnant or think they might be pregnant. There is a potential for serious side effects to an unborn child. Talk to your health care professional or pharmacist for more information. Do not breast-feed an infant  while taking this medicine or for 4 months after the last dose. What side effects may I notice from receiving this medicine? Side effects that you should report to your doctor or health care professional as soon as possible: -allergic reactions  like skin rash, itching or hives, swelling of the face, lips, or tongue -bloody or black, tarry stools -breathing problems -change in the amount of urine -changes in vision -chest pain -chills -dark urine -dizziness or feeling faint or lightheaded -fast or irregular heartbeat -fever -flushing -hair loss -muscle pain -muscle weakness -persistent headache -signs and symptoms of high blood sugar such as dizziness; dry mouth; dry skin; fruity breath; nausea; stomach pain; increased hunger or thirst; increased urination -signs and symptoms of liver injury like dark urine, light-colored stools, loss of appetite, nausea, right upper belly pain, yellowing of the eyes or skin -stomach pain -weight loss Side effects that usually do not require medical attention (Report these to your doctor or health care professional if they continue or are bothersome.):constipation -cough -diarrhea -joint pain -tiredness This list may not describe all possible side effects. Call your doctor for medical advice about side effects. You may report side effects to FDA at 1-800-FDA-1088. Where should I keep my medicine? This drug is given in a hospital or clinic and will not be stored at home. NOTE: This sheet is a summary. It may not cover all possible information. If you have questions about this medicine, talk to your doctor, pharmacist, or health care provider.    2016, Elsevier/Gold Standard. (2015-01-27 17:24:19)

## 2016-04-04 NOTE — Telephone Encounter (Signed)
Gave pt appt for may & avs °

## 2016-04-04 NOTE — Assessment & Plan Note (Signed)
The patient is  experiencing significant localized throat pain due to persistence of cancer. As previously discussed, we will switch his treatment to cage food. I will see him back in 3 weeks for further assessment He is aware that treatment is strictly palliative

## 2016-04-04 NOTE — Progress Notes (Signed)
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[place])   03/15/2015 - 03/17/2015 Hospital Admission He was admitted to the hospital for evaluation of dysphagia, SOB, hemoptosis, hoarseness, 30-40 pound weight loss and worsening bilateral neck masses for 5 months   03/15/2015 Imaging Ct showed extensive circumferential malignancy in the hypopharyngeal/supraglottic region with regional LN metastases   03/16/2015 Procedure He underwent ULTRASOUND-GUIDED BIOPSY OF LEFT CERVICAL LYMPH NODES   03/16/2015 Pathology Results Accession: [id number] LN biopsy showed invasive squamous cell cancer   03/16/2015 Pathology Results Accession: [id number] showed atypical squamous cells   03/25/2015 - 04/07/2015 Hospital Admission He was admitted to the hospital and underwent tracheostomy placement, feeeding tube placement but subsequently left [facility]   03/26/2015 Surgery He had multiple extraction of tooth numbers 1, 2, 5, 6, 7, 8, 9, 10, 11, 12, 13, 18, 19, 21, 22, 23, 24, 25, 26, 27, 28, and 29. and 4 Quadrants of alveoloplasty   03/26/2015 Surgery He underwent tracheostomy   03/31/2015 Surgery He had open gastrostomy tube placement by Dr. Donne Hazel   04/16/2015 - 05/18/2015 Radiation Therapy Laryngopharynx and bilateral neck / 50 Gy in 20 fractions to gross disease, 45 Gy in 20 fractions to high risk nodal echelons  Beams/energy: Helical IMRT / 6 MV photons   04/16/2015 Procedure Fluoroscopic reposition of the 18 French gastrostomy confirmed back in the stomach,   07/03/2015 Procedure IR performed replacement of gastrostomy tube with a new 60 French balloon retention tube   10/01/2015 Imaging PEt  scan showed persistent hypermetabolism within the primary supraglottic laryngeal tumor and within right retropharyngeal, bilateral level II and left level IV cervical nodal metastases   10/28/2015 Procedure He had placement of PICC line. The IR was not able to place PORT due to suspected upper respiratory infection   11/13/2015 - 03/21/2016 Chemotherapy He received 5FU, carboplatin chemo with weekly Erbitux   11/19/2015 Surgery Gastrostomy tube replaced.   11/30/2015 Procedure Placement of right jugular port-a-cath.   11/30/2015 Procedure PICC removed.   12/30/2015 Imaging PET CT showed positive response to Rx   02/26/2016 Procedure He underwent direct laryngoscopy with biopsy. Esophageal dilatation.    02/26/2016 Pathology Results Repeat biopsy of supraglottis showed persistent disease   02/29/2016 Procedure Gastrostomy tube exchanged.   03/28/2016 PET scan Hypermetabolic tissue in the posterior RIGHT hypopharynx is similar in pattern to PET-CT of 12/30/2015 but increased in metabolic activity.2. Hypermetabolic tissue / lymph nodes in the LEFT supraclavicular nodal station are in a similar pattern    04/04/2016 -  Chemotherapy He started on palliative chemotherapy with pembrolizumab    INTERVAL HISTORY: Please see below for problem oriented charting. He felt slightly better. Pain is better control and appetite is improving. He is not able to tell me which the prescription he is taking right now and he requested refill of some unknown type pills. He has frequent bowel movement.  REVIEW OF SYSTEMS:   Constitutional: Denies fevers, chills or abnormal weight loss Eyes: Denies blurriness of vision Respiratory: Denies cough, dyspnea or wheezes Cardiovascular: Denies palpitation, chest discomfort or lower extremity swelling Gastrointestinal:  Denies nausea, heartburn or change in bowel habits Skin: Denies abnormal skin rashes Lymphatics: Denies new lymphadenopathy or easy  bruising Neurological:Denies  numbness, tingling or new weaknesses Behavioral/Psych: Mood is stable, no new changes  All other systems were reviewed with the patient and are negative.  I have reviewed the past medical history, past surgical history, social history and family history with the patient and they are unchanged from previous note.  ALLERGIES:  is allergic to aspirin.  MEDICATIONS:  Current Outpatient Prescriptions  Medication Sig Dispense Refill  . fluticasone (FLONASE) 50 MCG/ACT nasal spray Place 2 sprays into both nostrils daily. 16 g 2  . guaiFENesin (ROBITUSSIN) 100 MG/5ML SOLN Take 5 mLs (100 mg total) by mouth every 4 (four) hours as needed for cough or to loosen phlegm. 473 mL 3  . lansoprazole (PREVACID) 30 MG capsule Take 1 capsule (30 mg total) by mouth 2 (two) times daily before a meal. 60 capsule 6  . levothyroxine (LEVOTHROID) 25 MCG tablet Take 1 tablet (25 mcg total) by mouth daily before breakfast. 30 tablet 3  . lidocaine (XYLOCAINE) 2 % solution Use as directed 15 mLs in the mouth or throat every 8 (eight) hours as needed for mouth pain. 100 mL 0  . LORazepam (ATIVAN) 0.5 MG tablet Take 1-2 tablets before PET/ CT Scans. 6 tablet 0  . LORazepam (ATIVAN) 1 MG tablet Take 1 tablet (1 mg total) by mouth 2 (two) times daily as needed for anxiety (or nausea). 10 tablet 0  . metoCLOPramide (REGLAN) 5 MG/5ML solution Place 5 mLs (5 mg total) into feeding tube 4 (four) times daily -  before meals and at bedtime. 240 mL 1  . morphine (MS CONTIN) 30 MG 12 hr tablet Take 1 tablet (30 mg total) by mouth every 12 (twelve) hours. 30 tablet 0  . morphine (MSIR) 30 MG tablet Take 1 tablet (30 mg total) by mouth every 4 (four) hours as needed for severe pain. 90 tablet 0  . nicotine (NICODERM CQ) 7 mg/24hr patch apply 7 mg patch daily x 4 wks after quitting smoking 14 patch 6  . Nutritional Supplements (FEEDING SUPPLEMENT, JEVITY 1.5 CAL/FIBER,) LIQD Give 1 can Osmolite 1.2 + 1 can of Jevity 1.5 QID via PEG with  60 cc free water before and after bolus. Flush with 240 cc free water BID between feedings. 948 mL   . Nutritional Supplements (FEEDING SUPPLEMENT, OSMOLITE 1.2 CAL,) LIQD Give 1 can Osmolite 1.2 + 1 can of Jevity 1.5 QID via PEG with 60 cc free water before and after bolus. Flush with 240 cc free water BID between feedings. 948 mL 0  . omeprazole (PRILOSEC) 20 MG capsule Take 1 capsule (20 mg total) by mouth 2 (two) times daily. 60 capsule 0  . ondansetron (ZOFRAN) 8 MG tablet Take 1 tablet (8 mg total) by mouth every 8 (eight) hours as needed for nausea. 60 tablet 3  . oxyCODONE (ROXICODONE) 5 MG/5ML solution Take 5 mLs (5 mg total) by mouth every 6 (six) hours as needed. 473 mL 0  . polyethylene glycol (MIRALAX) packet Take 17 g by mouth daily. 14 each 0  . predniSONE (DELTASONE) 20 MG tablet Take 1 tablet (20 mg total) by mouth daily with breakfast. 20 tablet 0  . SODIUM CHLORIDE, EXTERNAL, 0.9 % SOLN Use to clean around Trach and perform Trach care once daily and PRN 1000 mL 11  . zolpidem (AMBIEN) 5 MG tablet Take 1 tablet (5 mg total) by mouth at bedtime as needed for sleep. 30 tablet 0   No current facility-administered medications for this visit.  PHYSICAL EXAMINATION: ECOG PERFORMANCE STATUS: 1 - Symptomatic but completely ambulatory  Filed Vitals:   04/04/16 0930  BP: 106/78  Pulse: 75  Temp: 98.3 F (36.8 C)  Resp: 16   Filed Weights   04/04/16 0930  Weight: 146 lb 6.4 oz (66.407 kg)    GENERAL:alert, no distress and comfortable SKIN: skin color, texture, turgor are normal, no rashes or significant lesions EYES: normal, Conjunctiva are pink and non-injected, sclera clear OROPHARYNX:no exudate, no erythema and lips, buccal mucosa, and tongue normal  NECK: tracheostomy in situ Musculoskeletal:no cyanosis of digits and no clubbing  NEURO: alert & oriented x 3 with fluent speech, no focal motor/sensory deficits  LABORATORY DATA:  I have reviewed the data as listed     Component Value Date/Time   NA 140 03/21/2016 0820   NA 140 01/02/2016 1433   K 3.5 03/21/2016 0820   K 4.3 01/02/2016 1433   CL 99* 01/02/2016 1433   CO2 27 03/21/2016 0820   CO2 29 01/02/2016 1422   GLUCOSE 93 03/21/2016 0820   GLUCOSE 117* 01/02/2016 1433   BUN 14.5 03/21/2016 0820   BUN 17 01/02/2016 1433   CREATININE 0.8 03/21/2016 0820   CREATININE 0.80 01/02/2016 1433   CALCIUM 9.8 03/21/2016 0820   CALCIUM 9.8 01/02/2016 1422   PROT 7.5 03/21/2016 0820   PROT 7.4 01/02/2016 1422   ALBUMIN 3.9 03/21/2016 0820   ALBUMIN 3.9 01/02/2016 1422   AST 14 03/21/2016 0820   AST 28 01/02/2016 1422   ALT <9 03/21/2016 0820   ALT 15* 01/02/2016 1422   ALKPHOS 63 03/21/2016 0820   ALKPHOS 56 01/02/2016 1422   BILITOT 0.39 03/21/2016 0820   BILITOT 0.8 01/02/2016 1422   GFRNONAA >60 01/02/2016 1422   GFRAA >60 01/02/2016 1422    No results found for: SPEP, UPEP  Lab Results  Component Value Date   WBC 6.1 04/04/2016   NEUTROABS 4.8 04/04/2016   HGB 12.4* 04/04/2016   HCT 35.6* 04/04/2016   MCV 108.9* 04/04/2016   PLT 101* 04/04/2016      Chemistry      Component Value Date/Time   NA 140 03/21/2016 0820   NA 140 01/02/2016 1433   K 3.5 03/21/2016 0820   K 4.3 01/02/2016 1433   CL 99* 01/02/2016 1433   CO2 27 03/21/2016 0820   CO2 29 01/02/2016 1422   BUN 14.5 03/21/2016 0820   BUN 17 01/02/2016 1433   CREATININE 0.8 03/21/2016 0820   CREATININE 0.80 01/02/2016 1433      Component Value Date/Time   CALCIUM 9.8 03/21/2016 0820   CALCIUM 9.8 01/02/2016 1422   ALKPHOS 63 03/21/2016 0820   ALKPHOS 56 01/02/2016 1422   AST 14 03/21/2016 0820   AST 28 01/02/2016 1422   ALT <9 03/21/2016 0820   ALT 15* 01/02/2016 1422   BILITOT 0.39 03/21/2016 0820   BILITOT 0.8 01/02/2016 1422     ASSESSMENT & PLAN:   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 As previously  discussed, we will switch his treatment to cage food. I will see him back in 3 weeks for further assessment He is aware that treatment is strictly palliative   Tobacco abuse I continue to encourage him to quit smoking.   Protein-calorie malnutrition, severe ([place]) He has cancer cachexia. I have started him on low-dose prednisone to help with pain and also see if we can  help with appetite. It has improved We will get dietitian to follow closely. Continue to encourage him to take nutritional supplements on the regular basis  Cancer associated pain He has some noncompliance due to poor understanding of pain management. I encouraged him to bring all the pill bottles so that we can go through his prescriptions and pain management. I told him to discontinue oxycodone and just use IR morphine as needed for pain control. We discussed narcotic refill policy again.  Pancytopenia due to antineoplastic chemotherapy [facility]) This is likely due to recent treatment. No need transfusion needed Monitor carefully      All questions were answered. The patient knows to call the clinic with any problems, questions or concerns. No barriers to learning was detected. I spent 20 minutes counseling the patient face to face. The total time spent in the appointment was 25 minutes and more than 50% was on counseling and review of test results     [facility], [place], MD 04/04/2016 9:47 AM

## 2016-04-04 NOTE — Progress Notes (Signed)
Nutrition follow-up completed with patient during chemotherapy for cancer of the supraglottis. Weight was documented as 146.4 pounds April 24 decreased from 152.8 pounds March 20. Patient reports he has not been using his feeding tube because he was sick Endorses importance of maintaining/gaining weight. Reports he uses a combination of Osmolite 1.2 and Jevity 1.54 cans each providing 2560 cal, 113 g protein, and 2220 mL free water meeting 100% of estimated needs.  Nutrition diagnosis: Unintended weight loss continues.  Intervention: Patient should continue tube feeding as previously ordered. Patient encouraged continue to take medications as prescribed. Will check with [facility] regarding supplies.  Monitoring, evaluation, goals: Patient will work to increase tube feeding to goal rate to minimize further weight loss.  Next visit: Monday, May 15, during infusion.  **Disclaimer: This note was dictated with voice recognition software. Similar sounding words can inadvertently be transcribed and this note may contain transcription errors which may not have been corrected upon publication of note.**

## 2016-04-04 NOTE — Assessment & Plan Note (Signed)
He has cancer cachexia. I have started him on low-dose prednisone to help with pain and also see if we can help with appetite. It has improved We will get dietitian to follow closely. Continue to encourage him to take nutritional supplements on the regular basis

## 2016-04-04 NOTE — Assessment & Plan Note (Signed)
I continue to encourage him to quit smoking.  

## 2016-04-05 ENCOUNTER — Telehealth: Payer: Self-pay

## 2016-04-05 NOTE — Telephone Encounter (Signed)
-----   Message from Cora Collum, RN sent at 04/04/2016 12:39 PM EDT ----- Regarding: Dr. Alvy Bimler chemo follow up call Dr. Alvy Bimler, 1st time [facility]

## 2016-04-05 NOTE — Telephone Encounter (Signed)
[place] for chemotherapy F/U.  Left message for patient to call with questions and concerns

## 2016-04-07 ENCOUNTER — Other Ambulatory Visit: Payer: Self-pay

## 2016-04-11 ENCOUNTER — Other Ambulatory Visit: Payer: Self-pay

## 2016-04-12 ENCOUNTER — Telehealth: Payer: Self-pay | Admitting: *Deleted

## 2016-04-12 ENCOUNTER — Other Ambulatory Visit: Payer: Self-pay | Admitting: *Deleted

## 2016-04-12 MED ORDER — OMEPRAZOLE 20 MG PO CPDR: 20.0000 mg | DELAYED_RELEASE_CAPSULE | Freq: Two times a day (BID) | ORAL | Status: DC

## 2016-04-12 MED FILL — OMEPRAZOLE DR 20 MG CAPSULE: 20 | 30 days supply | Qty: 60 | Fill #2

## 2016-04-12 NOTE — Telephone Encounter (Signed)
"  I need to talk with Dr. Alvy Bimler nurse about a medication.  I think it's Prilosec but not sure.  Call transferred to ext [phone].

## 2016-04-13 ENCOUNTER — Other Ambulatory Visit: Payer: Self-pay | Admitting: *Deleted

## 2016-04-13 MED ORDER — LANSOPRAZOLE 30 MG PO CPDR: 30.0000 mg | DELAYED_RELEASE_CAPSULE | Freq: Two times a day (BID) | ORAL | Status: DC

## 2016-04-14 ENCOUNTER — Encounter: Payer: Self-pay | Admitting: Hematology and Oncology

## 2016-04-14 NOTE — Progress Notes (Signed)
recd via fax-will send to [facility] for lansoprazole 30mg  capsule

## 2016-04-22 NOTE — Telephone Encounter (Signed)
Kyle Alexander  04/14/2016  Documentation  MRN:  [id number]   Description: Male DOB: 1961-03-04  Provider: [name], MD  Department: [facility]        Reason for Visit     Reason for Visit History       Progress Notes      [name] at 04/14/2016 11:03 AM     Status: Signed       Expand All Collapse All   recd via fax-will send to [facility] for lansoprazole 30mg  capsule

## 2016-04-25 ENCOUNTER — Telehealth: Payer: Self-pay | Admitting: Hematology and Oncology

## 2016-04-25 ENCOUNTER — Ambulatory Visit: Payer: Medicaid Other

## 2016-04-25 ENCOUNTER — Encounter: Payer: Self-pay | Admitting: Hematology and Oncology

## 2016-04-25 ENCOUNTER — Ambulatory Visit (HOSPITAL_BASED_OUTPATIENT_CLINIC_OR_DEPARTMENT_OTHER): Payer: Medicaid Other

## 2016-04-25 ENCOUNTER — Other Ambulatory Visit (HOSPITAL_BASED_OUTPATIENT_CLINIC_OR_DEPARTMENT_OTHER): Payer: Medicaid Other

## 2016-04-25 ENCOUNTER — Encounter: Payer: Self-pay | Admitting: *Deleted

## 2016-04-25 ENCOUNTER — Ambulatory Visit: Payer: Medicaid Other | Admitting: Nutrition

## 2016-04-25 ENCOUNTER — Ambulatory Visit (HOSPITAL_BASED_OUTPATIENT_CLINIC_OR_DEPARTMENT_OTHER): Payer: Medicaid Other | Admitting: Hematology and Oncology

## 2016-04-25 VITALS — BP 122/72 | HR 94 | Temp 99.5°F | Resp 18 | Ht 70.0 in | Wt 140.6 lb

## 2016-04-25 DIAGNOSIS — G893 Neoplasm related pain (acute) (chronic): Secondary | ICD-10-CM | POA: Diagnosis not present

## 2016-04-25 DIAGNOSIS — C321 Malignant neoplasm of supraglottis: Secondary | ICD-10-CM

## 2016-04-25 DIAGNOSIS — R5383 Other fatigue: Secondary | ICD-10-CM | POA: Diagnosis not present

## 2016-04-25 DIAGNOSIS — Z720 Tobacco use: Secondary | ICD-10-CM | POA: Diagnosis not present

## 2016-04-25 DIAGNOSIS — Z930 Tracheostomy status: Secondary | ICD-10-CM | POA: Diagnosis not present

## 2016-04-25 DIAGNOSIS — R55 Syncope and collapse: Secondary | ICD-10-CM | POA: Insufficient documentation

## 2016-04-25 DIAGNOSIS — Z931 Gastrostomy status: Secondary | ICD-10-CM

## 2016-04-25 DIAGNOSIS — E43 Unspecified severe protein-calorie malnutrition: Secondary | ICD-10-CM | POA: Diagnosis not present

## 2016-04-25 DIAGNOSIS — Z5112 Encounter for antineoplastic immunotherapy: Secondary | ICD-10-CM | POA: Diagnosis present

## 2016-04-25 DIAGNOSIS — C770 Secondary and unspecified malignant neoplasm of lymph nodes of head, face and neck: Secondary | ICD-10-CM

## 2016-04-25 HISTORY — DX: Syncope and collapse: R55

## 2016-04-25 LAB — COMPREHENSIVE METABOLIC PANEL
ALT: 9 U/L (ref 0–55)
AST: 11 U/L (ref 5–34)
Albumin: 3.5 g/dL (ref 3.5–5.0)
Alkaline Phosphatase: 59 U/L (ref 40–150)
Anion Gap: 6 mEq/L (ref 3–11)
BUN: 9.5 mg/dL (ref 7.0–26.0)
CO2: 32 mEq/L — ABNORMAL HIGH (ref 22–29)
Calcium: 9.6 mg/dL (ref 8.4–10.4)
Chloride: 101 mEq/L (ref 98–109)
Creatinine: 0.7 mg/dL (ref 0.7–1.3)
EGFR: >90 mL/min/{1.73_m2} (ref >90)
Glucose: 124 mg/dl (ref 70–140)
Potassium: 3.7 mEq/L (ref 3.5–5.1)
Sodium: 139 mEq/L (ref 136–145)
Total Bilirubin: 0.34 mg/dL (ref 0.20–1.20)
Total Protein: 6.9 g/dL (ref 6.4–8.3)

## 2016-04-25 LAB — TSH: TSH: 1.405 m(IU)/L (ref 0.320–4.118)

## 2016-04-25 LAB — CBC WITH DIFFERENTIAL/PLATELET
BASO%: 0.3 % (ref 0.0–2.0)
Basophils Absolute: 0 10*3/uL (ref 0.0–0.1)
EOS%: 0.9 % (ref 0.0–7.0)
Eosinophils Absolute: 0.1 10*3/uL (ref 0.0–0.5)
HCT: 34.7 % — ABNORMAL LOW (ref 38.4–49.9)
HGB: 11.7 g/dL — ABNORMAL LOW (ref 13.0–17.1)
LYMPH%: 9.5 % — ABNORMAL LOW (ref 14.0–49.0)
MCH: 36.2 pg — ABNORMAL HIGH (ref 27.2–33.4)
MCHC: 33.7 g/dL (ref 32.0–36.0)
MCV: 107.4 fL — ABNORMAL HIGH (ref 79.3–98.0)
MONO#: 0.2 10*3/uL (ref 0.1–0.9)
MONO%: 3.2 % (ref 0.0–14.0)
NEUT#: 6.4 10*3/uL (ref 1.5–6.5)
NEUT%: 86.1 % — ABNORMAL HIGH (ref 39.0–75.0)
Platelets: 175 10*3/uL (ref 140–400)
RBC: 3.23 10*6/uL — ABNORMAL LOW (ref 4.20–5.82)
RDW: 13 % (ref 11.0–14.6)
WBC: 7.4 10*3/uL (ref 4.0–10.3)
lymph#: 0.7 10*3/uL — ABNORMAL LOW (ref 0.9–3.3)

## 2016-04-25 MED ORDER — SODIUM CHLORIDE 0.9 % IV SOLN
200.0000 mg | Freq: Once | INTRAVENOUS | Status: AC
  Administered 2016-04-25: 200 mg via INTRAVENOUS
  Filled 2016-04-25: qty 8

## 2016-04-25 MED ORDER — HYDROMORPHONE HCL 4 MG/ML IJ SOLN
2.0000 mg | Freq: Once | INTRAMUSCULAR | Status: AC
  Administered 2016-04-25: 2 mg via INTRAVENOUS

## 2016-04-25 MED ORDER — HYDROMORPHONE HCL 4 MG/ML IJ SOLN
INTRAMUSCULAR | Status: AC
  Filled 2016-04-25: qty 1

## 2016-04-25 MED ORDER — PREDNISONE 20 MG PO TABS: 20.0000 mg | ORAL_TABLET | Freq: Every day | ORAL | Status: DC

## 2016-04-25 MED ORDER — SODIUM CHLORIDE 0.9% FLUSH
10.0000 mL | INTRAVENOUS | Status: DC | PRN
  Administered 2016-04-25: 10 mL
  Filled 2016-04-25: qty 10

## 2016-04-25 MED ORDER — SODIUM CHLORIDE 0.9 % IV SOLN
Freq: Once | INTRAVENOUS | Status: AC
  Administered 2016-04-25: 14:00:00 via INTRAVENOUS

## 2016-04-25 MED ORDER — SODIUM CHLORIDE 0.9 % IJ SOLN
10.0000 mL | INTRAMUSCULAR | Status: DC | PRN
  Administered 2016-04-25: 10 mL
  Filled 2016-04-25: qty 10

## 2016-04-25 MED ORDER — HEPARIN SOD (PORK) LOCK FLUSH 100 UNIT/ML IV SOLN
500.0000 [IU] | Freq: Once | INTRAVENOUS | Status: AC | PRN
  Administered 2016-04-25: 500 [IU]
  Filled 2016-04-25: qty 5

## 2016-04-25 MED FILL — predniSONE 20 MG TABS: 20 | 34 days supply | Qty: 34 | Fill #0

## 2016-04-25 MED FILL — LEVOTHYROXINE 25 MCG TABLET: 25 | 30 days supply | Qty: 30 | Fill #3

## 2016-04-25 NOTE — Assessment & Plan Note (Signed)
I continue to encourage him to quit smoking.

## 2016-04-25 NOTE — Assessment & Plan Note (Signed)
He has cancer cachexia. I have started him on low-dose prednisone to help with pain and also see if we can help with appetite. He is noncompliant and ran out of prescription last week. I refill his prescription today. I reinforced the importance of him taking low-dose prednisone that should help with his appetite, energy and swelling around his tracheostomy tube. We will get dietitian to follow closely. Continue to encourage him to take nutritional supplements on the regular basis

## 2016-04-25 NOTE — Progress Notes (Signed)
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[place])   03/15/2015 - 03/17/2015 Hospital Admission He was admitted to the hospital for evaluation of dysphagia, SOB, hemoptosis, hoarseness, 30-40 pound weight loss and worsening bilateral neck masses for 5 months   03/15/2015 Imaging Ct showed extensive circumferential malignancy in the hypopharyngeal/supraglottic region with regional LN metastases   03/16/2015 Procedure He underwent ULTRASOUND-GUIDED BIOPSY OF LEFT CERVICAL LYMPH NODES   03/16/2015 Pathology Results Accession: [id number] LN biopsy showed invasive squamous cell cancer   03/16/2015 Pathology Results Accession: [id number] showed atypical squamous cells   03/25/2015 - 04/07/2015 Hospital Admission He was admitted to the hospital and underwent tracheostomy placement, feeeding tube placement but subsequently left [facility]   03/26/2015 Surgery He had multiple extraction of tooth numbers 1, 2, 5, 6, 7, 8, 9, 10, 11, 12, 13, 18, 19, 21, 22, 23, 24, 25, 26, 27, 28, and 29. and 4 Quadrants of alveoloplasty   03/26/2015 Surgery He underwent tracheostomy   03/31/2015 Surgery He had open gastrostomy tube placement by Dr. Donne Hazel   04/16/2015 - 05/18/2015 Radiation Therapy Laryngopharynx and bilateral neck / 50 Gy in 20 fractions to gross disease, 45 Gy in 20 fractions to high risk nodal echelons  Beams/energy: Helical IMRT / 6 MV photons   04/16/2015 Procedure Fluoroscopic reposition of the 18 French gastrostomy confirmed back in the stomach,   07/03/2015 Procedure IR performed replacement of gastrostomy tube with a new 63 French balloon retention tube   10/01/2015 Imaging PEt  scan showed persistent hypermetabolism within the primary supraglottic laryngeal tumor and within right retropharyngeal, bilateral level II and left level IV cervical nodal metastases   10/28/2015 Procedure He had placement of PICC line. The IR was not able to place PORT due to suspected upper respiratory infection   11/13/2015 - 03/21/2016 Chemotherapy He received 5FU, carboplatin chemo with weekly Erbitux   11/19/2015 Surgery Gastrostomy tube replaced.   11/30/2015 Procedure Placement of right jugular port-a-cath.   11/30/2015 Procedure PICC removed.   12/30/2015 Imaging PET CT showed positive response to Rx   02/26/2016 Procedure He underwent direct laryngoscopy with biopsy. Esophageal dilatation.    02/26/2016 Pathology Results Repeat biopsy of supraglottis showed persistent disease   02/29/2016 Procedure Gastrostomy tube exchanged.   03/28/2016 PET scan Hypermetabolic tissue in the posterior RIGHT hypopharynx is similar in pattern to PET-CT of 12/30/2015 but increased in metabolic activity.2. Hypermetabolic tissue / lymph nodes in the LEFT supraclavicular nodal station are in a similar pattern    04/04/2016 -  Chemotherapy He started on palliative chemotherapy with pembrolizumab    INTERVAL HISTORY: Please see below for problem oriented charting. He is seen prior to cycle 2 of treatment. He denies side effects from recent treatment. His pain appears to be better controlled with current narcotic prescription. Denies nausea or vomiting. Denies constipation. He denies recent fevers or chills. He has lost some weight due to poor appetite. He is not taking his medications consistently. He continues to smoke.  REVIEW OF SYSTEMS:   Constitutional: Denies fevers, chills Eyes: Denies blurriness of vision Ears, nose, mouth, throat, and face: Denies mucositis or sore throat Respiratory: Denies cough, dyspnea or wheezes Cardiovascular: Denies palpitation, chest discomfort  or lower extremity  swelling Gastrointestinal:  Denies nausea, heartburn or change in bowel habits Skin: Denies abnormal skin rashes Lymphatics: Denies new lymphadenopathy or easy bruising Neurological:Denies numbness, tingling or new weaknesses Behavioral/Psych: Mood is stable, no new changes  All other systems were reviewed with the patient and are negative.  I have reviewed the past medical history, past surgical history, social history and family history with the patient and they are unchanged from previous note.  ALLERGIES:  is allergic to aspirin.  MEDICATIONS:  Current Outpatient Prescriptions  Medication Sig Dispense Refill  . fluticasone (FLONASE) 50 MCG/ACT nasal spray Place 2 sprays into both nostrils daily. 16 g 2  . guaiFENesin (ROBITUSSIN) 100 MG/5ML SOLN Take 5 mLs (100 mg total) by mouth every 4 (four) hours as needed for cough or to loosen phlegm. 473 mL 3  . levothyroxine (LEVOTHROID) 25 MCG tablet Take 1 tablet (25 mcg total) by mouth daily before breakfast. 30 tablet 3  . lidocaine (XYLOCAINE) 2 % solution Use as directed 15 mLs in the mouth or throat every 8 (eight) hours as needed for mouth pain. 100 mL 0  . morphine (MS CONTIN) 30 MG 12 hr tablet Take 1 tablet (30 mg total) by mouth every 12 (twelve) hours. 30 tablet 0  . morphine (MSIR) 30 MG tablet Take 1 tablet (30 mg total) by mouth every 4 (four) hours as needed for severe pain. 90 tablet 0  . Nutritional Supplements (FEEDING SUPPLEMENT, JEVITY 1.5 CAL/FIBER,) LIQD Give 1 can Osmolite 1.2 + 1 can of Jevity 1.5 QID via PEG with 60 cc free water before and after bolus. Flush with 240 cc free water BID between feedings. 948 mL   . Nutritional Supplements (FEEDING SUPPLEMENT, OSMOLITE 1.2 CAL,) LIQD Give 1 can Osmolite 1.2 + 1 can of Jevity 1.5 QID via PEG with 60 cc free water before and after bolus. Flush with 240 cc free water BID between feedings. 948 mL 0  . omeprazole (PRILOSEC) 20 MG capsule Take 1 capsule (20 mg total) by mouth 2  (two) times daily. 60 capsule 0  . ondansetron (ZOFRAN) 8 MG tablet Take 1 tablet (8 mg total) by mouth every 8 (eight) hours as needed for nausea. 60 tablet 3  . predniSONE (DELTASONE) 20 MG tablet Take 1 tablet (20 mg total) by mouth daily with breakfast. 90 tablet 0  . SODIUM CHLORIDE, EXTERNAL, 0.9 % SOLN Use to clean around Trach and perform Trach care once daily and PRN 1000 mL 11  . lansoprazole (PREVACID) 30 MG capsule Take 1 capsule (30 mg total) by mouth 2 (two) times daily before a meal. (Patient not taking: Reported on 04/25/2016) 60 capsule 6  . LORazepam (ATIVAN) 0.5 MG tablet Take 1-2 tablets before PET/ CT Scans. (Patient not taking: Reported on 04/25/2016) 6 tablet 0  . LORazepam (ATIVAN) 1 MG tablet Take 1 tablet (1 mg total) by mouth 2 (two) times daily as needed for anxiety (or nausea). (Patient not taking: Reported on 04/25/2016) 10 tablet 0  . metoCLOPramide (REGLAN) 5 MG/5ML solution Place 5 mLs (5 mg total) into feeding tube 4 (four) times daily -  before meals and at bedtime. (Patient not taking: Reported on 04/25/2016) 240 mL 1  . nicotine (NICODERM CQ) 7 mg/24hr patch apply 7 mg patch daily x 4 wks after quitting smoking (Patient not taking: Reported on 04/25/2016) 14 patch 6  . oxyCODONE (ROXICODONE) 5 MG/5ML solution Take 5 mLs (5 mg total) by mouth every 6 (six)  hours as needed. (Patient not taking: Reported on 04/25/2016) 473 mL 0  . polyethylene glycol (MIRALAX) packet Take 17 g by mouth daily. (Patient not taking: Reported on 04/25/2016) 14 each 0  . zolpidem (AMBIEN) 5 MG tablet Take 1 tablet (5 mg total) by mouth at bedtime as needed for sleep. (Patient not taking: Reported on 04/25/2016) 30 tablet 0   No current facility-administered medications for this visit.    PHYSICAL EXAMINATION: ECOG PERFORMANCE STATUS: 1 - Symptomatic but completely ambulatory  Filed Vitals:   04/25/16 1145  BP: 122/72  Pulse: 94  Temp: 99.5 F (37.5 C)  Resp: 18   Filed Weights    04/25/16 1145  Weight: 140 lb 9.6 oz (63.776 kg)    GENERAL:alert, no distress and comfortable SKIN: skin color, texture, turgor are normal, no rashes or significant lesions EYES: normal, Conjunctiva are pink and non-injected, sclera clear OROPHARYNX:no exudate, no erythema and lips, buccal mucosa, and tongue normal  NECK: Tracheostomy site looks okay.  LYMPH:  no palpable lymphadenopathy in the cervical, axillary or inguinal LUNGS: clear to auscultation and percussion with normal breathing effort HEART: regular rate & rhythm and no murmurs and no lower extremity edema ABDOMEN:abdomen soft, non-tender and normal bowel sounds. Feeding tube site looks okay Musculoskeletal:no cyanosis of digits and no clubbing  NEURO: alert & oriented x 3 with fluent speech, no focal motor/sensory deficits  LABORATORY DATA:  I have reviewed the data as listed    Component Value Date/Time   NA 139 04/25/2016 1104   NA 140 01/02/2016 1433   K 3.7 04/25/2016 1104   K 4.3 01/02/2016 1433   CL 99* 01/02/2016 1433   CO2 32* 04/25/2016 1104   CO2 29 01/02/2016 1422   GLUCOSE 124 04/25/2016 1104   GLUCOSE 117* 01/02/2016 1433   BUN 9.5 04/25/2016 1104   BUN 17 01/02/2016 1433   CREATININE 0.7 04/25/2016 1104   CREATININE 0.80 01/02/2016 1433   CALCIUM 9.6 04/25/2016 1104   CALCIUM 9.8 01/02/2016 1422   PROT 6.9 04/25/2016 1104   PROT 7.4 01/02/2016 1422   ALBUMIN 3.5 04/25/2016 1104   ALBUMIN 3.9 01/02/2016 1422   AST 11 04/25/2016 1104   AST 28 01/02/2016 1422   ALT 9 04/25/2016 1104   ALT 15* 01/02/2016 1422   ALKPHOS 59 04/25/2016 1104   ALKPHOS 56 01/02/2016 1422   BILITOT 0.34 04/25/2016 1104   BILITOT 0.8 01/02/2016 1422   GFRNONAA >60 01/02/2016 1422   GFRAA >60 01/02/2016 1422    No results found for: SPEP, UPEP  Lab Results  Component Value Date   WBC 7.4 04/25/2016   NEUTROABS 6.4 04/25/2016   HGB 11.7* 04/25/2016   HCT 34.7* 04/25/2016   MCV 107.4* 04/25/2016   PLT 175  04/25/2016      Chemistry      Component Value Date/Time   NA 139 04/25/2016 1104   NA 140 01/02/2016 1433   K 3.7 04/25/2016 1104   K 4.3 01/02/2016 1433   CL 99* 01/02/2016 1433   CO2 32* 04/25/2016 1104   CO2 29 01/02/2016 1422   BUN 9.5 04/25/2016 1104   BUN 17 01/02/2016 1433   CREATININE 0.7 04/25/2016 1104   CREATININE 0.80 01/02/2016 1433      Component Value Date/Time   CALCIUM 9.6 04/25/2016 1104   CALCIUM 9.8 01/02/2016 1422   ALKPHOS 59 04/25/2016 1104   ALKPHOS 56 01/02/2016 1422   AST 11 04/25/2016 1104   AST 28 01/02/2016  1422   ALT 9 04/25/2016 1104   ALT 15* 01/02/2016 1422   BILITOT 0.34 04/25/2016 1104   BILITOT 0.8 01/02/2016 1422      ASSESSMENT & PLAN:  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[facility]) He tolerated treatment much better compared to his prior chemotherapy. Pain control has improved. I suspect he may have good response to treatment. I will resume retreatment today without delay. I plan to repeat imaging study after 3 cycles of chemotherapy to assess response to treatment.  Tobacco abuse I continue to encourage him to quit smoking.     Syncopal episodes He had nonspecific spell of passing out last week. It is not clear whether he may had seizures or just syncopal episode from dehydration. Due to his extensive disease, I recommend MR of the brain to exclude brain metastasis and he agreed with the plan of care.  Protein-calorie malnutrition, severe ([place]) He has cancer cachexia. I have started him on low-dose prednisone to help with pain and also see if we can help with appetite. He is noncompliant and ran out of prescription last week. I refill his prescription today. I reinforced the importance of him taking low-dose prednisone that should help with his appetite, energy and swelling around his tracheostomy tube. We will get dietitian to follow closely. Continue to encourage him to take nutritional supplements on the regular  basis    Tracheostomy status ([place]) I recommend close follow-up with ENT. It does not look infected    Gastrostomy tube in place [facility]) The feeding tube looks fine It was replaced recently and appears to be functioning well.    Cancer associated pain He has some noncompliance  I encouraged him to bring all the pill bottles so that we can go through his prescriptions and pain management. I told him to discontinue oxycodone and just use IR morphine as needed for pain control. We discussed narcotic refill policy again.   Orders Placed This Encounter  Procedures  . MR Brain W Wo Contrast    Standing Status: Future     Number of Occurrences:      Standing Expiration Date: 04/25/2017    Order Specific Question:  If indicated for the ordered procedure, I authorize the administration of contrast media per Radiology protocol    Answer:  Yes    Order Specific Question:  Reason for Exam (SYMPTOM  OR DIAGNOSIS REQUIRED)    Answer:  syncopal episode, supraglottic ca, exclude brain mets    Order Specific Question:  Preferred imaging location?    Answer:  [facility] (table limit-350 lbs)    Order Specific Question:  What is the patient's sedation requirement?    Answer:  No Sedation    Order Specific Question:  Does the patient have a pacemaker or implanted devices?    Answer:  No   All questions were answered. The patient knows to call the clinic with any problems, questions or concerns. No barriers to learning was detected. I spent 25 minutes counseling the patient face to face. The total time spent in the appointment was 40 minutes and more than 50% was on counseling and review of test results     [facility], [place], MD 04/25/2016 1:05 PM

## 2016-04-25 NOTE — Assessment & Plan Note (Signed)
The feeding tube looks fine It was replaced recently and appears to be functioning well.

## 2016-04-25 NOTE — Progress Notes (Signed)
1345-Pt requesting IV pain medicine for complaints of head pain, rated a 5/10.  Dr. Alvy Bimler notified and order received to give pt Dilaudid 2 mg IV now.    1410-Pt states that pain is down to a 4 at this time.

## 2016-04-25 NOTE — Assessment & Plan Note (Signed)
I recommend close follow-up with ENT. It does not look infected

## 2016-04-25 NOTE — Assessment & Plan Note (Signed)
He has some noncompliance  I encouraged him to bring all the pill bottles so that we can go through his prescriptions and pain management. I told him to discontinue oxycodone and just use IR morphine as needed for pain control. We discussed narcotic refill policy again.

## 2016-04-25 NOTE — Assessment & Plan Note (Signed)
He had nonspecific spell of passing out last week. It is not clear whether he may had seizures or just syncopal episode from dehydration. Due to his extensive disease, I recommend MR of the brain to exclude brain metastasis and he agreed with the plan of care.

## 2016-04-25 NOTE — Patient Instructions (Signed)
[facility] Discharge Instructions for Patients Receiving Chemotherapy  Today you received the following chemotherapy agents:  Keytruda  To help prevent nausea and vomiting after your treatment, we encourage you to take your nausea medication as ordered per MD.   If you develop nausea and vomiting that is not controlled by your nausea medication, call the clinic.   BELOW ARE SYMPTOMS THAT SHOULD BE REPORTED IMMEDIATELY:  *FEVER GREATER THAN 100.5 F  *CHILLS WITH OR WITHOUT FEVER  NAUSEA AND VOMITING THAT IS NOT CONTROLLED WITH YOUR NAUSEA MEDICATION  *UNUSUAL SHORTNESS OF BREATH  *UNUSUAL BRUISING OR BLEEDING  TENDERNESS IN MOUTH AND THROAT WITH OR WITHOUT PRESENCE OF ULCERS  *URINARY PROBLEMS  *BOWEL PROBLEMS  UNUSUAL RASH Items with * indicate a potential emergency and should be followed up as soon as possible.  Feel free to call the clinic you have any questions or concerns. The clinic phone number is [phone].  Please show the CHEMO ALERT CARD at check-in to the Emergency Department and triage nurse.   

## 2016-04-25 NOTE — Progress Notes (Signed)
Nutrition follow up completed with patient during chemotherapy for cancer of the supraglottis. Weight decreased and documented as 140.5 pounds. Decreased from 146.4 pounds on April 24. Patient reports he feels "so-so". States he uses between 4 and 6 cans of nutrition supplement daily. Patient continues to be non-compliant with goal rate. Reports the reason for this is poor appetite.  Nutrition Diagnosis: Unintended weight loss continues.  Intervention: Increase TF to goal of 4 cans osmolite 1.2 and 4 cans of Jevity 1.5 daily. Take medications as prescribed. Discourage further weight loss.  Monitoring, Evaluation, Goals: Patient will tolerate increased TF to meet minimum needs.  Next Visit:Monday, June 5 during infusion.

## 2016-04-25 NOTE — Telephone Encounter (Signed)
Gave and printed appt sched and avs for pt for JUNE °

## 2016-04-25 NOTE — Patient Instructions (Signed)

## 2016-04-25 NOTE — Assessment & Plan Note (Signed)
He tolerated treatment much better compared to his prior chemotherapy. Pain control has improved. I suspect he may have good response to treatment. I will resume retreatment today without delay. I plan to repeat imaging study after 3 cycles of chemotherapy to assess response to treatment.

## 2016-04-26 NOTE — Progress Notes (Signed)
  Oncology Nurse Navigator Documentation  Navigator Location: [facility]-Med Onc (04/25/16 1145) Navigator Encounter Type: Clinic/MDC (04/25/16 1145)           Patient Visit Type: MedOnc (04/25/16 1145) Treatment Phase: Active Tx (04/25/16 1145) Barriers/Navigation Needs: No barriers at this time (04/25/16 1145)   Interventions: None required (04/25/16 1145)            Acuity: Level 2 (04/25/16 1145)   Acuity Level 2: Ongoing guidance and education throughout treatment as needed (04/25/16 1145)       To provide support, encouragement and care continuity, met with Kyle Alexander during follow-up appt with Dr. Alvy Bimler.  He reported improved pain control, absence of nausea, hemoptysis.  He attributed 2 pound weight loss to lack of appetite.  He voiced understanding of Dr. Calton Dach emphasis on meeting daily goal of supplement to maintain weight.  He noted he had an appt with Dory Peru, RD, following this appt. He proceeded with today's Keytruda tmt as planned.  He understands I can be contacted with needs/concerns.  Gayleen Orem, RN, BSN, [place] [facility] [place] [phone]   Time Spent with Patient: 45 (04/25/16 1145)

## 2016-04-28 ENCOUNTER — Telehealth: Payer: Self-pay | Admitting: *Deleted

## 2016-04-28 MED ORDER — MORPHINE SULFATE 30 MG PO TABS: 30.0000 mg | ORAL_TABLET | ORAL | Status: DC | PRN

## 2016-04-28 MED ORDER — MORPHINE SULFATE ER 30 MG PO TBCR: 30.0000 mg | EXTENDED_RELEASE_TABLET | Freq: Two times a day (BID) | ORAL | Status: DC

## 2016-04-28 NOTE — Telephone Encounter (Signed)
Pt requests refill on MS Contin and MS IR.  States completely out of the "long acting morphine" and only has a few of the IR left.   He would like to pick them up tomorrow. Rxs left for Dr. Alvy Bimler to sign.

## 2016-05-02 ENCOUNTER — Telehealth: Payer: Self-pay | Admitting: *Deleted

## 2016-05-02 NOTE — Telephone Encounter (Signed)
Instructed pt to come over to [facility] after his MRI in the morning and ask for Dr. Calton Dach nurse.  I will look at his hand and report to Dr. Alvy Bimler in the morning for any further instructions/ orders.  Pt verbalized understanding.

## 2016-05-02 NOTE — Telephone Encounter (Signed)
Call with request for "return call from collaborative nurse.  I come in tomorrow for an MRI and would like one of the nurses to take a look at my left hand.  I showed it to them last week and tried a cream but my left thumb hurts from the nail down the thumb.  This is not a hang nail.  It hurts to touch, swollen and turning yellow."  MRI scheduled at 7:00 am tomorrow morning.

## 2016-05-03 ENCOUNTER — Encounter: Payer: Self-pay | Admitting: Hematology and Oncology

## 2016-05-03 ENCOUNTER — Ambulatory Visit (HOSPITAL_COMMUNITY)
Admission: RE | Admit: 2016-05-03 | Discharge: 2016-05-03 | Disposition: A | Discharge status: Home / Self Care | Payer: Medicaid Other | Source: Ambulatory Visit | Attending: Hematology and Oncology | Admitting: Hematology and Oncology

## 2016-05-03 DIAGNOSIS — C321 Malignant neoplasm of supraglottis: Secondary | ICD-10-CM | POA: Diagnosis not present

## 2016-05-03 DIAGNOSIS — R55 Syncope and collapse: Secondary | ICD-10-CM | POA: Insufficient documentation

## 2016-05-03 MED ORDER — GADOBENATE DIMEGLUMINE 529 MG/ML IV SOLN
15.0000 mL | Freq: Once | INTRAVENOUS | Status: AC | PRN
  Administered 2016-05-03: 13 mL via INTRAVENOUS

## 2016-05-03 NOTE — Progress Notes (Signed)
I reviewed the MRI report. MRI head is negative. I believe his recent syncopal episode could be likely due to dehydration. He is currently taking cefuroxime at home.

## 2016-05-03 NOTE — Telephone Encounter (Signed)
"  I called last week to get refills, did not pick them up yet.  Are they ready for pick up?"  Informed him yes  They were signed last week, ready for pick up.  "Will come by later today."

## 2016-05-03 NOTE — Progress Notes (Signed)
The patient presented with a sore thumb. It is affecting the tip of the finger and under the nailbed. Clinically, it looks that he has subungual infection. The patient has antibiotics to take home. I recommend dry dressing over it with conservative management. At this point, I do not believe he needs incision and drainage.

## 2016-05-12 MED FILL — MORPHINE SULFATE IR 30 MG T: 30 | 15 days supply | Qty: 90 | Fill #0

## 2016-05-13 ENCOUNTER — Encounter: Payer: Self-pay | Admitting: Hematology and Oncology

## 2016-05-13 NOTE — Progress Notes (Signed)
Faxed nctraks form for morphine prior auth

## 2016-05-16 ENCOUNTER — Ambulatory Visit: Payer: Medicaid Other | Admitting: Nutrition

## 2016-05-16 ENCOUNTER — Other Ambulatory Visit (HOSPITAL_BASED_OUTPATIENT_CLINIC_OR_DEPARTMENT_OTHER): Payer: Medicaid Other

## 2016-05-16 ENCOUNTER — Ambulatory Visit: Payer: Medicaid Other

## 2016-05-16 ENCOUNTER — Telehealth: Payer: Self-pay | Admitting: Hematology and Oncology

## 2016-05-16 ENCOUNTER — Ambulatory Visit (HOSPITAL_BASED_OUTPATIENT_CLINIC_OR_DEPARTMENT_OTHER): Payer: Medicaid Other | Admitting: Hematology and Oncology

## 2016-05-16 ENCOUNTER — Ambulatory Visit (HOSPITAL_BASED_OUTPATIENT_CLINIC_OR_DEPARTMENT_OTHER): Payer: Medicaid Other

## 2016-05-16 ENCOUNTER — Encounter: Payer: Self-pay | Admitting: Hematology and Oncology

## 2016-05-16 VITALS — BP 108/72 | HR 77 | Temp 98.1°F | Resp 17 | Ht 70.0 in | Wt 147.9 lb

## 2016-05-16 DIAGNOSIS — C770 Secondary and unspecified malignant neoplasm of lymph nodes of head, face and neck: Secondary | ICD-10-CM

## 2016-05-16 DIAGNOSIS — C321 Malignant neoplasm of supraglottis: Secondary | ICD-10-CM

## 2016-05-16 DIAGNOSIS — Z5112 Encounter for antineoplastic immunotherapy: Secondary | ICD-10-CM | POA: Diagnosis not present

## 2016-05-16 DIAGNOSIS — Z930 Tracheostomy status: Secondary | ICD-10-CM

## 2016-05-16 DIAGNOSIS — R05 Cough: Secondary | ICD-10-CM

## 2016-05-16 DIAGNOSIS — Z95828 Presence of other vascular implants and grafts: Secondary | ICD-10-CM

## 2016-05-16 DIAGNOSIS — F1010 Alcohol abuse, uncomplicated: Secondary | ICD-10-CM

## 2016-05-16 DIAGNOSIS — Z931 Gastrostomy status: Secondary | ICD-10-CM

## 2016-05-16 DIAGNOSIS — R5383 Other fatigue: Secondary | ICD-10-CM

## 2016-05-16 DIAGNOSIS — G893 Neoplasm related pain (acute) (chronic): Secondary | ICD-10-CM | POA: Diagnosis not present

## 2016-05-16 DIAGNOSIS — Z720 Tobacco use: Secondary | ICD-10-CM | POA: Diagnosis not present

## 2016-05-16 DIAGNOSIS — E43 Unspecified severe protein-calorie malnutrition: Secondary | ICD-10-CM

## 2016-05-16 DIAGNOSIS — Z79899 Other long term (current) drug therapy: Secondary | ICD-10-CM

## 2016-05-16 DIAGNOSIS — Z430 Encounter for attention to tracheostomy: Secondary | ICD-10-CM

## 2016-05-16 LAB — CBC WITH DIFFERENTIAL/PLATELET
BASO%: 0.2 % (ref 0.0–2.0)
BASOS ABS: 0 10*3/uL (ref 0.0–0.1)
EOS%: 0.5 % (ref 0.0–7.0)
Eosinophils Absolute: 0 10*3/uL (ref 0.0–0.5)
HEMATOCRIT: 34.4 % — AB (ref 38.4–49.9)
HGB: 11.5 g/dL — ABNORMAL LOW (ref 13.0–17.1)
LYMPH#: 0.5 10*3/uL — AB (ref 0.9–3.3)
LYMPH%: 7.2 % — AB (ref 14.0–49.0)
MCH: 35.4 pg — AB (ref 27.2–33.4)
MCHC: 33.5 g/dL (ref 32.0–36.0)
MCV: 105.8 fL — AB (ref 79.3–98.0)
MONO#: 0.6 10*3/uL (ref 0.1–0.9)
MONO%: 7.9 % (ref 0.0–14.0)
NEUT#: 6.3 10*3/uL (ref 1.5–6.5)
NEUT%: 84.2 % — AB (ref 39.0–75.0)
PLATELETS: 191 10*3/uL (ref 140–400)
RBC: 3.25 10*6/uL — AB (ref 4.20–5.82)
RDW: 14.1 % (ref 11.0–14.6)
WBC: 7.5 10*3/uL (ref 4.0–10.3)

## 2016-05-16 LAB — COMPREHENSIVE METABOLIC PANEL
ALT: 15 U/L (ref 0–55)
ANION GAP: 7 meq/L (ref 3–11)
AST: 10 U/L (ref 5–34)
Albumin: 3.3 g/dL — ABNORMAL LOW (ref 3.5–5.0)
Alkaline Phosphatase: 42 U/L (ref 40–150)
BUN: 14.9 mg/dL (ref 7.0–26.0)
CALCIUM: 9.4 mg/dL (ref 8.4–10.4)
CHLORIDE: 103 meq/L (ref 98–109)
CO2: 31 mEq/L — ABNORMAL HIGH (ref 22–29)
CREATININE: 0.7 mg/dL (ref 0.7–1.3)
Glucose: 84 mg/dl (ref 70–140)
POTASSIUM: 3.9 meq/L (ref 3.5–5.1)
Sodium: 140 mEq/L (ref 136–145)
Total Bilirubin: 0.3 mg/dL (ref 0.20–1.20)
Total Protein: 6.4 g/dL (ref 6.4–8.3)

## 2016-05-16 LAB — TSH: TSH: 3.964 m(IU)/L (ref 0.320–4.118)

## 2016-05-16 MED ORDER — SODIUM CHLORIDE 0.9 % IV SOLN
200.0000 mg | Freq: Once | INTRAVENOUS | Status: AC
  Administered 2016-05-16: 200 mg via INTRAVENOUS
  Filled 2016-05-16: qty 8

## 2016-05-16 MED ORDER — LORAZEPAM 1 MG PO TABS: 1.0000 mg | ORAL_TABLET | Freq: Two times a day (BID) | ORAL | Status: DC | PRN

## 2016-05-16 MED ORDER — SODIUM CHLORIDE 0.9% FLUSH
10.0000 mL | INTRAVENOUS | Status: DC | PRN
  Administered 2016-05-16: 10 mL
  Filled 2016-05-16: qty 10

## 2016-05-16 MED ORDER — SODIUM CHLORIDE 0.9% FLUSH
10.0000 mL | INTRAVENOUS | Status: DC | PRN
  Administered 2016-05-16: 10 mL via INTRAVENOUS
  Filled 2016-05-16: qty 10

## 2016-05-16 MED ORDER — HEPARIN SOD (PORK) LOCK FLUSH 100 UNIT/ML IV SOLN
500.0000 [IU] | Freq: Once | INTRAVENOUS | Status: AC | PRN
  Administered 2016-05-16: 500 [IU]
  Filled 2016-05-16: qty 5

## 2016-05-16 MED ORDER — SODIUM CHLORIDE 0.9 % IV SOLN
Freq: Once | INTRAVENOUS | Status: AC
  Administered 2016-05-16: 12:00:00 via INTRAVENOUS

## 2016-05-16 MED FILL — LORazepam 1 MG TABS: 1 | 15 days supply | Qty: 30 | Fill #0

## 2016-05-16 NOTE — Telephone Encounter (Signed)
s.w. pt and advised on 6.26 appt.Marland KitchenMarland KitchenMarland KitchenMarland Kitchenpt ok and aware

## 2016-05-16 NOTE — Patient Instructions (Signed)
[facility] Discharge Instructions for Patients Receiving Chemotherapy  Today you received the following chemotherapy agents: Keytruda   To help prevent nausea and vomiting after your treatment, we encourage you to take your nausea medication as directed    If you develop nausea and vomiting that is not controlled by your nausea medication, call the clinic.   BELOW ARE SYMPTOMS THAT SHOULD BE REPORTED IMMEDIATELY:  *FEVER GREATER THAN 100.5 F  *CHILLS WITH OR WITHOUT FEVER  NAUSEA AND VOMITING THAT IS NOT CONTROLLED WITH YOUR NAUSEA MEDICATION  *UNUSUAL SHORTNESS OF BREATH  *UNUSUAL BRUISING OR BLEEDING  TENDERNESS IN MOUTH AND THROAT WITH OR WITHOUT PRESENCE OF ULCERS  *URINARY PROBLEMS  *BOWEL PROBLEMS  UNUSUAL RASH Items with * indicate a potential emergency and should be followed up as soon as possible.  Feel free to call the clinic you have any questions or concerns. The clinic phone number is [phone].  Please show the CHEMO ALERT CARD at check-in to the Emergency Department and triage nurse.   

## 2016-05-16 NOTE — Assessment & Plan Note (Signed)
The feeding tube looks fine It was replaced recently and appears to be functioning well.

## 2016-05-16 NOTE — Assessment & Plan Note (Signed)
His pain is better controlled when prednisone was added along with his pain medicine. We will continue to same for now.

## 2016-05-16 NOTE — Progress Notes (Signed)
Nutrition follow-up completed with patient during chemotherapy for cancer of the supraglottis. Weight improved and was documented as 147.9 pounds, improved from 140.6 pounds May 15. Patient denies nausea and vomiting. Patient reports he can tolerate liquids by mouth. Reports no bowel movement for 4 days.  However, he denies constipation. Patient continuing to use tube feeding.  He cannot tell me how many cans he uses, although in the past., he has used fo4r cans Osmolite 1.2 and 4 cans Jevity 1.5.  Estimated nutrition needs: 2300-2500 calories, 95-110 grams protein, 2.3 L fluid.  4 cans of Osmolite 1.2+4 cans Jevity 1.5 provides 2560 cal, 113 g protein, 2220 mL free water.  Nutrition diagnosis: Unintended weight loss has improved.  Intervention:  Patient educated to continue tube feeding at goal rate to promote weight maintenance/weight gain. Encouraged increased fluid intake as tolerated. Reviewed strategies for avoiding constipation. Teach back method used.  Monitoring, evaluation, goals: Patient will continue to use tube feeding and oral intake as tolerated to promote weight maintenance/weight gain.  Next visit: To be scheduled as needed.  **Disclaimer: This note was dictated with voice recognition software. Similar sounding words can inadvertently be transcribed and this note may contain transcription errors which may not have been corrected upon publication of note.**

## 2016-05-16 NOTE — Assessment & Plan Note (Signed)
The patient continues to drink alcohol. Again I discussed the importance of remaining abstinent while on treatment

## 2016-05-16 NOTE — Progress Notes (Signed)
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[place])   03/15/2015 - 03/17/2015 Hospital Admission He was admitted to the hospital for evaluation of dysphagia, SOB, hemoptosis, hoarseness, 30-40 pound weight loss and worsening bilateral neck masses for 5 months   03/15/2015 Imaging Ct showed extensive circumferential malignancy in the hypopharyngeal/supraglottic region with regional LN metastases   03/16/2015 Procedure He underwent ULTRASOUND-GUIDED BIOPSY OF LEFT CERVICAL LYMPH NODES   03/16/2015 Pathology Results Accession: [id number] LN biopsy showed invasive squamous cell cancer   03/16/2015 Pathology Results Accession: [id number] showed atypical squamous cells   03/25/2015 - 04/07/2015 Hospital Admission He was admitted to the hospital and underwent tracheostomy placement, feeeding tube placement but subsequently left [facility]   03/26/2015 Surgery He had multiple extraction of tooth numbers 1, 2, 5, 6, 7, 8, 9, 10, 11, 12, 13, 18, 19, 21, 22, 23, 24, 25, 26, 27, 28, and 29. and 4 Quadrants of alveoloplasty   03/26/2015 Surgery He underwent tracheostomy   03/31/2015 Surgery He had open gastrostomy tube placement by Dr. Donne Hazel   04/16/2015 - 05/18/2015 Radiation Therapy Laryngopharynx and bilateral neck / 50 Gy in 20 fractions to gross disease, 45 Gy in 20 fractions to high risk nodal echelons  Beams/energy: Helical IMRT / 6 MV photons   04/16/2015 Procedure Fluoroscopic reposition of the 18 French gastrostomy confirmed back in the stomach,   07/03/2015 Procedure IR performed replacement of gastrostomy tube with a new 42 French balloon retention tube   10/01/2015 Imaging PEt  scan showed persistent hypermetabolism within the primary supraglottic laryngeal tumor and within right retropharyngeal, bilateral level II and left level IV cervical nodal metastases   10/28/2015 Procedure He had placement of PICC line. The IR was not able to place PORT due to suspected upper respiratory infection   11/13/2015 - 03/21/2016 Chemotherapy He received 5FU, carboplatin chemo with weekly Erbitux   11/19/2015 Surgery Gastrostomy tube replaced.   11/30/2015 Procedure Placement of right jugular port-a-cath.   11/30/2015 Procedure PICC removed.   12/30/2015 Imaging PET CT showed positive response to Rx   02/26/2016 Procedure He underwent direct laryngoscopy with biopsy. Esophageal dilatation.    02/26/2016 Pathology Results Repeat biopsy of supraglottis showed persistent disease   02/29/2016 Procedure Gastrostomy tube exchanged.   03/28/2016 PET scan Hypermetabolic tissue in the posterior RIGHT hypopharynx is similar in pattern to PET-CT of 12/30/2015 but increased in metabolic activity.2. Hypermetabolic tissue / lymph nodes in the LEFT supraclavicular nodal station are in a similar pattern    04/04/2016 -  Chemotherapy He started on palliative chemotherapy with pembrolizumab   05/03/2016 Imaging MRI head is negative    INTERVAL HISTORY: Please see below for problem oriented charting. He returns for further follow-up. He felt better since the last time I saw him. No nausea or vomiting. His pain is under good control with addition of prednisone. He has greater appetite and is gaining weight. Denies recent infection He felt that he tolerated new chemotherapy better compared to his last visit He continues to have productive cough but minimum sputum. The patient continues to smoke and drink  REVIEW OF SYSTEMS:   Constitutional: Denies fevers, chills or abnormal weight loss Eyes: Denies blurriness of vision  Respiratory: Denies cough, dyspnea or wheezes Cardiovascular: Denies palpitation,  chest discomfort or lower extremity swelling Gastrointestinal:  Denies nausea, heartburn or change in bowel habits Skin: Denies abnormal skin rashes Lymphatics: Denies new lymphadenopathy or easy bruising Neurological:Denies numbness, tingling or new weaknesses Behavioral/Psych: Mood is stable, no new changes  All other systems were reviewed with the patient and are negative.  I have reviewed the past medical history, past surgical history, social history and family history with the patient and they are unchanged from previous note.  ALLERGIES:  is allergic to aspirin.  MEDICATIONS:  Current Outpatient Prescriptions  Medication Sig Dispense Refill  . fluticasone (FLONASE) 50 MCG/ACT nasal spray Place 2 sprays into both nostrils daily. 16 g 2  . guaiFENesin (ROBITUSSIN) 100 MG/5ML SOLN Take 5 mLs (100 mg total) by mouth every 4 (four) hours as needed for cough or to loosen phlegm. 473 mL 3  . lansoprazole (PREVACID) 30 MG capsule Take 1 capsule (30 mg total) by mouth 2 (two) times daily before a meal. 60 capsule 6  . levothyroxine (LEVOTHROID) 25 MCG tablet Take 1 tablet (25 mcg total) by mouth daily before breakfast. 30 tablet 3  . lidocaine (XYLOCAINE) 2 % solution Use as directed 15 mLs in the mouth or throat every 8 (eight) hours as needed for mouth pain. 100 mL 0  . LORazepam (ATIVAN) 1 MG tablet Take 1 tablet (1 mg total) by mouth 2 (two) times daily as needed for anxiety (or nausea). 30 tablet 0  . metoCLOPramide (REGLAN) 5 MG/5ML solution Place 5 mLs (5 mg total) into feeding tube 4 (four) times daily -  before meals and at bedtime. 240 mL 1  . morphine (MS CONTIN) 30 MG 12 hr tablet Take 1 tablet (30 mg total) by mouth every 12 (twelve) hours. 60 tablet 0  . morphine (MSIR) 30 MG tablet Take 1 tablet (30 mg total) by mouth every 4 (four) hours as needed for severe pain. 90 tablet 0  . Nutritional Supplements (FEEDING SUPPLEMENT, JEVITY 1.5 CAL/FIBER,) LIQD Give 1 can Osmolite 1.2 + 1  can of Jevity 1.5 QID via PEG with 60 cc free water before and after bolus. Flush with 240 cc free water BID between feedings. 948 mL   . Nutritional Supplements (FEEDING SUPPLEMENT, OSMOLITE 1.2 CAL,) LIQD Give 1 can Osmolite 1.2 + 1 can of Jevity 1.5 QID via PEG with 60 cc free water before and after bolus. Flush with 240 cc free water BID between feedings. 948 mL 0  . omeprazole (PRILOSEC) 20 MG capsule Take 1 capsule (20 mg total) by mouth 2 (two) times daily. 60 capsule 0  . ondansetron (ZOFRAN) 8 MG tablet Take 1 tablet (8 mg total) by mouth every 8 (eight) hours as needed for nausea. 60 tablet 3  . polyethylene glycol (MIRALAX) packet Take 17 g by mouth daily. 14 each 0  . predniSONE (DELTASONE) 20 MG tablet Take 1 tablet (20 mg total) by mouth daily with breakfast. 90 tablet 0  . SODIUM CHLORIDE, EXTERNAL, 0.9 % SOLN Use to clean around Trach and perform Trach care once daily and PRN 1000 mL 11  . zolpidem (AMBIEN) 5 MG tablet Take 1 tablet (5 mg total) by mouth at bedtime as needed for sleep. 30 tablet 0   No current facility-administered medications for this visit.   Facility-Administered Medications Ordered in Other Visits  Medication Dose Route Frequency Provider Last Rate Last Dose  . sodium chloride flush (NS) 0.9 % injection 10  mL  10 mL Intracatheter PRN [name], MD   10 mL at 05/16/16 1314    PHYSICAL EXAMINATION: ECOG PERFORMANCE STATUS: 1 - Symptomatic but completely ambulatory  Filed Vitals:   05/16/16 1129  BP: 108/72  Pulse: 77  Temp: 98.1 F (36.7 C)  Resp: 17   Filed Weights   05/16/16 1129  Weight: 147 lb 14.4 oz (67.087 kg)    GENERAL:alert, no distress and comfortable SKIN: skin color, texture, turgor are normal, no rashes or significant lesions EYES: normal, Conjunctiva are pink and non-injected, sclera clear OROPHARYNX:no exudate, no erythema and lips, buccal mucosa, and tongue normal  NECK:tracheostomy in situ. His neck is thick and fibrosis from  prior treatment  LYMPH:  no palpable lymphadenopathy in the cervical, axillary or inguinal LUNGS: clear to auscultation and percussion with normal breathing effort HEART: regular rate & rhythm and no murmurs and no lower extremity edema ABDOMEN:abdomen soft, non-tender and normal bowel sounds. Feeding tube site looks okay Musculoskeletal:no cyanosis of digits and no clubbing  NEURO: alert & oriented x 3 with fluent speech, no focal motor/sensory deficits  LABORATORY DATA:  I have reviewed the data as listed    Component Value Date/Time   NA 140 05/16/2016 1044   NA 140 01/02/2016 1433   K 3.9 05/16/2016 1044   K 4.3 01/02/2016 1433   CL 99* 01/02/2016 1433   CO2 31* 05/16/2016 1044   CO2 29 01/02/2016 1422   GLUCOSE 84 05/16/2016 1044   GLUCOSE 117* 01/02/2016 1433   BUN 14.9 05/16/2016 1044   BUN 17 01/02/2016 1433   CREATININE 0.7 05/16/2016 1044   CREATININE 0.80 01/02/2016 1433   CALCIUM 9.4 05/16/2016 1044   CALCIUM 9.8 01/02/2016 1422   PROT 6.4 05/16/2016 1044   PROT 7.4 01/02/2016 1422   ALBUMIN 3.3* 05/16/2016 1044   ALBUMIN 3.9 01/02/2016 1422   AST 10 05/16/2016 1044   AST 28 01/02/2016 1422   ALT 15 05/16/2016 1044   ALT 15* 01/02/2016 1422   ALKPHOS 42 05/16/2016 1044   ALKPHOS 56 01/02/2016 1422   BILITOT 0.30 05/16/2016 1044   BILITOT 0.8 01/02/2016 1422   GFRNONAA >60 01/02/2016 1422   GFRAA >60 01/02/2016 1422    No results found for: SPEP, UPEP  Lab Results  Component Value Date   WBC 7.5 05/16/2016   NEUTROABS 6.3 05/16/2016   HGB 11.5* 05/16/2016   HCT 34.4* 05/16/2016   MCV 105.8* 05/16/2016   PLT 191 05/16/2016      Chemistry      Component Value Date/Time   NA 140 05/16/2016 1044   NA 140 01/02/2016 1433   K 3.9 05/16/2016 1044   K 4.3 01/02/2016 1433   CL 99* 01/02/2016 1433   CO2 31* 05/16/2016 1044   CO2 29 01/02/2016 1422   BUN 14.9 05/16/2016 1044   BUN 17 01/02/2016 1433   CREATININE 0.7 05/16/2016 1044   CREATININE 0.80  01/02/2016 1433      Component Value Date/Time   CALCIUM 9.4 05/16/2016 1044   CALCIUM 9.8 01/02/2016 1422   ALKPHOS 42 05/16/2016 1044   ALKPHOS 56 01/02/2016 1422   AST 10 05/16/2016 1044   AST 28 01/02/2016 1422   ALT 15 05/16/2016 1044   ALT 15* 01/02/2016 1422   BILITOT 0.30 05/16/2016 1044   BILITOT 0.8 01/02/2016 1422      ASSESSMENT & PLAN:  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[facility]) He tolerated treatment much better compared to his prior  chemotherapy. Pain control has improved. I suspect he may have good response to treatment. I will resume retreatment today without delay. I plan to repeat imaging study after 3 cycles of chemotherapy to assess response to treatment.  Tobacco abuse I continue to encourage him to quit smoking.     Alcohol abuse The patient continues to drink alcohol. Again I discussed the importance of remaining abstinent while on treatment  Protein-calorie malnutrition, severe ([place]) He has cancer cachexia. I have started him on low-dose prednisone to help with pain and also see if we can help with appetite. He is doing better and is gaining weight. I recommend he start weaning off by reducing prednisone to every other day and reducing the dose to 10 mg  We will get dietitian to follow closely. Continue to encourage him to take nutritional supplements on the regular basis  Tracheostomy care [facility]) I recommend close follow-up with ENT. It does not look infected    Gastrostomy tube in place [facility]) The feeding tube looks fine It was replaced recently and appears to be functioning well.    Cancer associated pain His pain is better controlled when prednisone was added along with his pain medicine. We will continue to same for now.    Orders Placed This Encounter  Procedures  . NM PET Image Restag (PS) Skull Base To Thigh    Standing Status: Future     Number of Occurrences:      Standing Expiration Date: 07/16/2017    Order Specific  Question:  Reason for Exam (SYMPTOM  OR DIAGNOSIS REQUIRED)    Answer:  staging scan to assess supraglotic ca, assess response to Rx    Order Specific Question:  Preferred imaging location?    Answer:  [facility]   All questions were answered. The patient knows to call the clinic with any problems, questions or concerns. No barriers to learning was detected. I spent 25 minutes counseling the patient face to face. The total time spent in the appointment was 40 minutes and more than 50% was on counseling and review of test results     [facility], [place], MD 05/16/2016 3:39 PM

## 2016-05-16 NOTE — Assessment & Plan Note (Signed)
He has cancer cachexia. I have started him on low-dose prednisone to help with pain and also see if we can help with appetite. He is doing better and is gaining weight. I recommend he start weaning off by reducing prednisone to every other day and reducing the dose to 10 mg  We will get dietitian to follow closely. Continue to encourage him to take nutritional supplements on the regular basis

## 2016-05-16 NOTE — Assessment & Plan Note (Signed)
He tolerated treatment much better compared to his prior chemotherapy. Pain control has improved. I suspect he may have good response to treatment. I will resume retreatment today without delay. I plan to repeat imaging study after 3 cycles of chemotherapy to assess response to treatment.

## 2016-05-16 NOTE — Assessment & Plan Note (Signed)
I recommend close follow-up with ENT. It does not look infected

## 2016-05-16 NOTE — Assessment & Plan Note (Signed)
I continue to encourage him to quit smoking.

## 2016-05-17 MED FILL — SODIUM CHLORIDE 0.9% IRRIG.: 0.9 | 30 days supply | Qty: 1000 | Fill #1

## 2016-05-17 MED FILL — OMEPRAZOLE DR 20 MG CAPSULE: 20 | 30 days supply | Qty: 60 | Fill #3

## 2016-05-17 MED FILL — LEVOTHYROXINE 25 MCG TABLET: 25 | 30 days supply | Qty: 30 | Fill #1

## 2016-05-26 MED FILL — MORPHINE SULF 30 MG TAB SA: 30 | 30 days supply | Qty: 60 | Fill #0

## 2016-06-01 ENCOUNTER — Other Ambulatory Visit: Payer: Self-pay | Admitting: Hematology and Oncology

## 2016-06-01 DIAGNOSIS — C321 Malignant neoplasm of supraglottis: Secondary | ICD-10-CM

## 2016-06-01 DIAGNOSIS — C770 Secondary and unspecified malignant neoplasm of lymph nodes of head, face and neck: Secondary | ICD-10-CM

## 2016-06-02 ENCOUNTER — Encounter (HOSPITAL_COMMUNITY): Payer: Self-pay

## 2016-06-02 ENCOUNTER — Ambulatory Visit (HOSPITAL_COMMUNITY)
Admission: RE | Admit: 2016-06-02 | Discharge: 2016-06-02 | Disposition: A | Discharge status: Home / Self Care | Payer: Medicaid Other | Source: Ambulatory Visit | Attending: Hematology and Oncology | Admitting: Hematology and Oncology

## 2016-06-02 DIAGNOSIS — C321 Malignant neoplasm of supraglottis: Secondary | ICD-10-CM | POA: Diagnosis present

## 2016-06-02 DIAGNOSIS — R590 Localized enlarged lymph nodes: Secondary | ICD-10-CM | POA: Diagnosis not present

## 2016-06-02 DIAGNOSIS — C770 Secondary and unspecified malignant neoplasm of lymph nodes of head, face and neck: Secondary | ICD-10-CM | POA: Diagnosis not present

## 2016-06-02 DIAGNOSIS — J384 Edema of larynx: Secondary | ICD-10-CM | POA: Diagnosis not present

## 2016-06-02 DIAGNOSIS — E0789 Other specified disorders of thyroid: Secondary | ICD-10-CM | POA: Diagnosis not present

## 2016-06-02 DIAGNOSIS — E041 Nontoxic single thyroid nodule: Secondary | ICD-10-CM | POA: Insufficient documentation

## 2016-06-02 MED ORDER — IOPAMIDOL (ISOVUE-300) INJECTION 61%
75.0000 mL | Freq: Once | INTRAVENOUS | Status: AC | PRN
  Administered 2016-06-02: 75 mL via INTRAVENOUS

## 2016-06-02 MED FILL — predniSONE 20 MG TABS: 20 | 34 days supply | Qty: 34 | Fill #1

## 2016-06-03 ENCOUNTER — Ambulatory Visit (HOSPITAL_COMMUNITY): Payer: Medicaid Other

## 2016-06-06 ENCOUNTER — Telehealth: Payer: Self-pay | Admitting: Hematology and Oncology

## 2016-06-06 ENCOUNTER — Other Ambulatory Visit (HOSPITAL_BASED_OUTPATIENT_CLINIC_OR_DEPARTMENT_OTHER): Payer: Medicaid Other

## 2016-06-06 ENCOUNTER — Ambulatory Visit: Payer: Medicaid Other

## 2016-06-06 ENCOUNTER — Encounter: Payer: Self-pay | Admitting: *Deleted

## 2016-06-06 ENCOUNTER — Ambulatory Visit (HOSPITAL_BASED_OUTPATIENT_CLINIC_OR_DEPARTMENT_OTHER): Payer: Medicaid Other | Admitting: Hematology and Oncology

## 2016-06-06 ENCOUNTER — Ambulatory Visit (HOSPITAL_BASED_OUTPATIENT_CLINIC_OR_DEPARTMENT_OTHER): Payer: Medicaid Other

## 2016-06-06 VITALS — BP 107/71 | HR 70 | Temp 99.2°F | Resp 18

## 2016-06-06 VITALS — BP 116/67 | HR 79 | Temp 98.2°F | Resp 19 | Ht 70.0 in | Wt 151.3 lb

## 2016-06-06 DIAGNOSIS — C770 Secondary and unspecified malignant neoplasm of lymph nodes of head, face and neck: Secondary | ICD-10-CM

## 2016-06-06 DIAGNOSIS — Z720 Tobacco use: Secondary | ICD-10-CM

## 2016-06-06 DIAGNOSIS — C321 Malignant neoplasm of supraglottis: Secondary | ICD-10-CM

## 2016-06-06 DIAGNOSIS — Z5112 Encounter for antineoplastic immunotherapy: Secondary | ICD-10-CM

## 2016-06-06 DIAGNOSIS — G893 Neoplasm related pain (acute) (chronic): Secondary | ICD-10-CM

## 2016-06-06 DIAGNOSIS — Z79899 Other long term (current) drug therapy: Secondary | ICD-10-CM | POA: Diagnosis not present

## 2016-06-06 DIAGNOSIS — R5383 Other fatigue: Secondary | ICD-10-CM

## 2016-06-06 DIAGNOSIS — Z930 Tracheostomy status: Secondary | ICD-10-CM | POA: Diagnosis not present

## 2016-06-06 DIAGNOSIS — T451X5A Adverse effect of antineoplastic and immunosuppressive drugs, initial encounter: Secondary | ICD-10-CM

## 2016-06-06 DIAGNOSIS — Z95828 Presence of other vascular implants and grafts: Secondary | ICD-10-CM

## 2016-06-06 DIAGNOSIS — D61810 Antineoplastic chemotherapy induced pancytopenia: Secondary | ICD-10-CM

## 2016-06-06 LAB — CBC WITH DIFFERENTIAL/PLATELET
BASO%: 0.1 % (ref 0.0–2.0)
BASOS ABS: 0 10*3/uL (ref 0.0–0.1)
EOS%: 0.3 % (ref 0.0–7.0)
Eosinophils Absolute: 0 10*3/uL (ref 0.0–0.5)
HEMATOCRIT: 35.1 % — AB (ref 38.4–49.9)
HGB: 11.7 g/dL — ABNORMAL LOW (ref 13.0–17.1)
LYMPH#: 0.4 10*3/uL — AB (ref 0.9–3.3)
LYMPH%: 3 % — ABNORMAL LOW (ref 14.0–49.0)
MCH: 34.8 pg — AB (ref 27.2–33.4)
MCHC: 33.3 g/dL (ref 32.0–36.0)
MCV: 104.5 fL — ABNORMAL HIGH (ref 79.3–98.0)
MONO#: 1 10*3/uL — AB (ref 0.1–0.9)
MONO%: 8 % (ref 0.0–14.0)
NEUT#: 10.6 10*3/uL — ABNORMAL HIGH (ref 1.5–6.5)
NEUT%: 88.6 % — AB (ref 39.0–75.0)
PLATELETS: 171 10*3/uL (ref 140–400)
RBC: 3.36 10*6/uL — ABNORMAL LOW (ref 4.20–5.82)
RDW: 15 % — ABNORMAL HIGH (ref 11.0–14.6)
WBC: 12 10*3/uL — ABNORMAL HIGH (ref 4.0–10.3)

## 2016-06-06 LAB — COMPREHENSIVE METABOLIC PANEL
ALT: 13 U/L (ref 0–55)
ANION GAP: 8 meq/L (ref 3–11)
AST: 10 U/L (ref 5–34)
Albumin: 3.6 g/dL (ref 3.5–5.0)
Alkaline Phosphatase: 38 U/L — ABNORMAL LOW (ref 40–150)
BUN: 15 mg/dL (ref 7.0–26.0)
CHLORIDE: 104 meq/L (ref 98–109)
CO2: 29 mEq/L (ref 22–29)
CREATININE: 0.7 mg/dL (ref 0.7–1.3)
Calcium: 9.5 mg/dL (ref 8.4–10.4)
EGFR: >90 mL/min/{1.73_m2} (ref >90)
Glucose: 115 mg/dl (ref 70–140)
POTASSIUM: 3.8 meq/L (ref 3.5–5.1)
Sodium: 140 mEq/L (ref 136–145)
Total Bilirubin: 0.54 mg/dL (ref 0.20–1.20)
Total Protein: 6.9 g/dL (ref 6.4–8.3)

## 2016-06-06 LAB — TSH: TSH: 2.84 m[IU]/L (ref 0.320–4.118)

## 2016-06-06 MED ORDER — SODIUM CHLORIDE 0.9% FLUSH
10.0000 mL | INTRAVENOUS | Status: DC | PRN
  Administered 2016-06-06: 10 mL
  Filled 2016-06-06: qty 10

## 2016-06-06 MED ORDER — SODIUM CHLORIDE 0.9 % IV SOLN
Freq: Once | INTRAVENOUS | Status: AC
  Administered 2016-06-06: 13:00:00 via INTRAVENOUS

## 2016-06-06 MED ORDER — SODIUM CHLORIDE 0.9 % IV SOLN
200.0000 mg | Freq: Once | INTRAVENOUS | Status: AC
  Administered 2016-06-06: 200 mg via INTRAVENOUS
  Filled 2016-06-06: qty 8

## 2016-06-06 MED ORDER — SODIUM CHLORIDE 0.9% FLUSH
10.0000 mL | INTRAVENOUS | Status: DC | PRN
  Administered 2016-06-06: 10 mL via INTRAVENOUS
  Filled 2016-06-06: qty 10

## 2016-06-06 MED ORDER — HEPARIN SOD (PORK) LOCK FLUSH 100 UNIT/ML IV SOLN
500.0000 [IU] | Freq: Once | INTRAVENOUS | Status: AC | PRN
  Administered 2016-06-06: 500 [IU]
  Filled 2016-06-06: qty 5

## 2016-06-06 NOTE — Patient Instructions (Signed)
[facility] Discharge Instructions for Patients Receiving Chemotherapy  Today you received the following chemotherapy agents: Keytruda   To help prevent nausea and vomiting after your treatment, we encourage you to take your nausea medication as directed    If you develop nausea and vomiting that is not controlled by your nausea medication, call the clinic.   BELOW ARE SYMPTOMS THAT SHOULD BE REPORTED IMMEDIATELY:  *FEVER GREATER THAN 100.5 F  *CHILLS WITH OR WITHOUT FEVER  NAUSEA AND VOMITING THAT IS NOT CONTROLLED WITH YOUR NAUSEA MEDICATION  *UNUSUAL SHORTNESS OF BREATH  *UNUSUAL BRUISING OR BLEEDING  TENDERNESS IN MOUTH AND THROAT WITH OR WITHOUT PRESENCE OF ULCERS  *URINARY PROBLEMS  *BOWEL PROBLEMS  UNUSUAL RASH Items with * indicate a potential emergency and should be followed up as soon as possible.  Feel free to call the clinic you have any questions or concerns. The clinic phone number is [phone].  Please show the CHEMO ALERT CARD at check-in to the Emergency Department and triage nurse.   

## 2016-06-06 NOTE — Patient Instructions (Signed)

## 2016-06-06 NOTE — Telephone Encounter (Signed)
Gave pt cal & avs °

## 2016-06-07 NOTE — Progress Notes (Signed)
  Oncology Nurse Navigator Documentation  Navigator Location: [facility] (06/06/16 1215) Navigator Encounter Type: Follow-up Appt (06/06/16 1215)           Patient Visit Type: Follow-up (06/06/16 1215) Treatment Phase: Treatment (06/06/16 1215)       To provide support, encouragement and care continuity, met with Mr. Kyle Alexander during Established Patient appt with Dr. Alvy Bimler. He voiced understanding of Dr. Calton Dach discussion of recent CT, plan to continue treatment for the next 3 months. He reported oral intake is limited to liquids, instilling 8 cans of nutritional supplement daily.  He has gained 3 pounds since his 6/5 visit. He continues to smoke, understands importance of discontinuing for optimal prognosis. Mr. Sites understands I can be contacted with needs/concerns.  Gayleen Orem, RN, BSN, [place] [facility] [place] [phone]                          Time Spent with Patient: 30 (06/06/16 1215)

## 2016-06-07 NOTE — Progress Notes (Signed)
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[place])   03/15/2015 - 03/17/2015 Hospital Admission He was admitted to the hospital for evaluation of dysphagia, SOB, hemoptosis, hoarseness, 30-40 pound weight loss and worsening bilateral neck masses for 5 months   03/15/2015 Imaging Ct showed extensive circumferential malignancy in the hypopharyngeal/supraglottic region with regional LN metastases   03/16/2015 Procedure He underwent ULTRASOUND-GUIDED BIOPSY OF LEFT CERVICAL LYMPH NODES   03/16/2015 Pathology Results Accession: [id number] LN biopsy showed invasive squamous cell cancer   03/16/2015 Pathology Results Accession: [id number] showed atypical squamous cells   03/25/2015 - 04/07/2015 Hospital Admission He was admitted to the hospital and underwent tracheostomy placement, feeeding tube placement but subsequently left [facility]   03/26/2015 Surgery He had multiple extraction of tooth numbers 1, 2, 5, 6, 7, 8, 9, 10, 11, 12, 13, 18, 19, 21, 22, 23, 24, 25, 26, 27, 28, and 29. and 4 Quadrants of alveoloplasty   03/26/2015 Surgery He underwent tracheostomy   03/31/2015 Surgery He had open gastrostomy tube placement by Dr. Donne Hazel   04/16/2015 - 05/18/2015 Radiation Therapy Laryngopharynx and bilateral neck / 50 Gy in 20 fractions to gross disease, 45 Gy in 20 fractions to high risk nodal echelons  Beams/energy: Helical IMRT / 6 MV photons   04/16/2015 Procedure Fluoroscopic reposition of the 18 French gastrostomy confirmed back in the stomach,   07/03/2015 Procedure IR performed replacement of gastrostomy tube with a new 62 French balloon retention tube   10/01/2015 Imaging PEt  scan showed persistent hypermetabolism within the primary supraglottic laryngeal tumor and within right retropharyngeal, bilateral level II and left level IV cervical nodal metastases   10/28/2015 Procedure He had placement of PICC line. The IR was not able to place PORT due to suspected upper respiratory infection   11/13/2015 - 03/21/2016 Chemotherapy He received 5FU, carboplatin chemo with weekly Erbitux   11/19/2015 Surgery Gastrostomy tube replaced.   11/30/2015 Procedure Placement of right jugular port-a-cath.   11/30/2015 Procedure PICC removed.   12/30/2015 Imaging PET CT showed positive response to Rx   02/26/2016 Procedure He underwent direct laryngoscopy with biopsy. Esophageal dilatation.    02/26/2016 Pathology Results Repeat biopsy of supraglottis showed persistent disease   02/29/2016 Procedure Gastrostomy tube exchanged.   03/28/2016 PET scan Hypermetabolic tissue in the posterior RIGHT hypopharynx is similar in pattern to PET-CT of 12/30/2015 but increased in metabolic activity.2. Hypermetabolic tissue / lymph nodes in the LEFT supraclavicular nodal station are in a similar pattern    04/04/2016 -  Chemotherapy He started on palliative chemotherapy with pembrolizumab   05/03/2016 Imaging MRI head is negative   06/02/2016 Imaging Mild improvement of diffuse pharyngeal and supraglottic edema.Increased asymmetry of right-sided hypopharyngeal/supraglottic softtissue compared to the prior neck CT with FDG uptake in this region on prior PET-CT. Residual/recurrent tumor is possible    INTERVAL HISTORY: Please see below for problem oriented charting. He feels well. He continues to have mild throat pain but the pain control has improved with recent prednisone therapy. He has excellent appetite and he is gaining weight. Denies recent infection. No recent nausea  REVIEW OF SYSTEMS:   Constitutional: Denies fevers, chills or abnormal weight loss Eyes: Denies blurriness  of vision Ears, nose,  mouth, throat, and face: Denies mucositis or sore throat Respiratory: Denies cough, dyspnea or wheezes Cardiovascular: Denies palpitation, chest discomfort or lower extremity swelling Gastrointestinal:  Denies nausea, heartburn or change in bowel habits Skin: Denies abnormal skin rashes Lymphatics: Denies new lymphadenopathy or easy bruising Neurological:Denies numbness, tingling or new weaknesses Behavioral/Psych: Mood is stable, no new changes  All other systems were reviewed with the patient and are negative.  I have reviewed the past medical history, past surgical history, social history and family history with the patient and they are unchanged from previous note.  ALLERGIES:  is allergic to aspirin.  MEDICATIONS:  Current Outpatient Prescriptions  Medication Sig Dispense Refill  . fluticasone (FLONASE) 50 MCG/ACT nasal spray Place 2 sprays into both nostrils daily. 16 g 2  . guaiFENesin (ROBITUSSIN) 100 MG/5ML SOLN Take 5 mLs (100 mg total) by mouth every 4 (four) hours as needed for cough or to loosen phlegm. 473 mL 3  . lansoprazole (PREVACID) 30 MG capsule Take 1 capsule (30 mg total) by mouth 2 (two) times daily before a meal. 60 capsule 6  . levothyroxine (LEVOTHROID) 25 MCG tablet Take 1 tablet (25 mcg total) by mouth daily before breakfast. 30 tablet 3  . lidocaine (XYLOCAINE) 2 % solution Use as directed 15 mLs in the mouth or throat every 8 (eight) hours as needed for mouth pain. 100 mL 0  . LORazepam (ATIVAN) 1 MG tablet Take 1 tablet (1 mg total) by mouth 2 (two) times daily as needed for anxiety (or nausea). 30 tablet 0  . metoCLOPramide (REGLAN) 5 MG/5ML solution Place 5 mLs (5 mg total) into feeding tube 4 (four) times daily -  before meals and at bedtime. 240 mL 1  . morphine (MS CONTIN) 30 MG 12 hr tablet Take 1 tablet (30 mg total) by mouth every 12 (twelve) hours. 60 tablet 0  . morphine (MSIR) 30 MG tablet Take 1 tablet (30 mg total) by mouth every 4 (four) hours as  needed for severe pain. 90 tablet 0  . Nutritional Supplements (FEEDING SUPPLEMENT, JEVITY 1.5 CAL/FIBER,) LIQD Give 1 can Osmolite 1.2 + 1 can of Jevity 1.5 QID via PEG with 60 cc free water before and after bolus. Flush with 240 cc free water BID between feedings. 948 mL   . Nutritional Supplements (FEEDING SUPPLEMENT, OSMOLITE 1.2 CAL,) LIQD Give 1 can Osmolite 1.2 + 1 can of Jevity 1.5 QID via PEG with 60 cc free water before and after bolus. Flush with 240 cc free water BID between feedings. 948 mL 0  . omeprazole (PRILOSEC) 20 MG capsule Take 1 capsule (20 mg total) by mouth 2 (two) times daily. 60 capsule 0  . ondansetron (ZOFRAN) 8 MG tablet Take 1 tablet (8 mg total) by mouth every 8 (eight) hours as needed for nausea. 60 tablet 3  . polyethylene glycol (MIRALAX) packet Take 17 g by mouth daily. 14 each 0  . predniSONE (DELTASONE) 20 MG tablet Take 1 tablet (20 mg total) by mouth daily with breakfast. 90 tablet 0  . SODIUM CHLORIDE, EXTERNAL, 0.9 % SOLN Use to clean around Trach and perform Trach care once daily and PRN 1000 mL 11  . zolpidem (AMBIEN) 5 MG tablet Take 1 tablet (5 mg total) by mouth at bedtime as needed for sleep. 30 tablet 0   No current facility-administered medications for this visit.    PHYSICAL EXAMINATION: ECOG PERFORMANCE STATUS: 1 - Symptomatic but completely ambulatory  Filed Vitals:   06/06/16 1205  BP: 116/67  Pulse: 79  Temp: 98.2 F (36.8 C)  Resp: 19   Filed Weights   06/06/16 1205  Weight: 151 lb 4.8 oz (68.629 kg)    GENERAL:alert, no distress and comfortable SKIN: skin color, texture, turgor are normal, no rashes or significant lesions EYES: normal, Conjunctiva are pink and non-injected, sclera clear OROPHARYNX:no exudate, no erythema and lips, buccal mucosa, and tongue normal  NECK: Tracheostomy site looks clean  LYMPH:  no palpable lymphadenopathy in the cervical, axillary or inguinal LUNGS: clear to auscultation and percussion with  normal breathing effort HEART: regular rate & rhythm and no murmurs and no lower extremity edema ABDOMEN:abdomen soft, non-tender and normal bowel sounds. Feeding tube site looks clean Musculoskeletal:no cyanosis of digits and no clubbing  NEURO: alert & oriented x 3 with fluent speech, no focal motor/sensory deficits  LABORATORY DATA:  I have reviewed the data as listed    Component Value Date/Time   NA 140 06/06/2016 1132   NA 140 01/02/2016 1433   K 3.8 06/06/2016 1132   K 4.3 01/02/2016 1433   CL 99* 01/02/2016 1433   CO2 29 06/06/2016 1132   CO2 29 01/02/2016 1422   GLUCOSE 115 06/06/2016 1132   GLUCOSE 117* 01/02/2016 1433   BUN 15.0 06/06/2016 1132   BUN 17 01/02/2016 1433   CREATININE 0.7 06/06/2016 1132   CREATININE 0.80 01/02/2016 1433   CALCIUM 9.5 06/06/2016 1132   CALCIUM 9.8 01/02/2016 1422   PROT 6.9 06/06/2016 1132   PROT 7.4 01/02/2016 1422   ALBUMIN 3.6 06/06/2016 1132   ALBUMIN 3.9 01/02/2016 1422   AST 10 06/06/2016 1132   AST 28 01/02/2016 1422   ALT 13 06/06/2016 1132   ALT 15* 01/02/2016 1422   ALKPHOS 38* 06/06/2016 1132   ALKPHOS 56 01/02/2016 1422   BILITOT 0.54 06/06/2016 1132   BILITOT 0.8 01/02/2016 1422   GFRNONAA >60 01/02/2016 1422   GFRAA >60 01/02/2016 1422    No results found for: SPEP, UPEP  Lab Results  Component Value Date   WBC 12.0* 06/06/2016   NEUTROABS 10.6* 06/06/2016   HGB 11.7* 06/06/2016   HCT 35.1* 06/06/2016   MCV 104.5* 06/06/2016   PLT 171 06/06/2016      Chemistry      Component Value Date/Time   NA 140 06/06/2016 1132   NA 140 01/02/2016 1433   K 3.8 06/06/2016 1132   K 4.3 01/02/2016 1433   CL 99* 01/02/2016 1433   CO2 29 06/06/2016 1132   CO2 29 01/02/2016 1422   BUN 15.0 06/06/2016 1132   BUN 17 01/02/2016 1433   CREATININE 0.7 06/06/2016 1132   CREATININE 0.80 01/02/2016 1433      Component Value Date/Time   CALCIUM 9.5 06/06/2016 1132   CALCIUM 9.8 01/02/2016 1422   ALKPHOS 38* 06/06/2016  1132   ALKPHOS 56 01/02/2016 1422   AST 10 06/06/2016 1132   AST 28 01/02/2016 1422   ALT 13 06/06/2016 1132   ALT 15* 01/02/2016 1422   BILITOT 0.54 06/06/2016 1132   BILITOT 0.8 01/02/2016 1422       RADIOGRAPHIC STUDIES: I reviewed CT imaging with him I have personally reviewed the radiological images as listed and agreed with the findings in the report.    ASSESSMENT & PLAN:  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[facility]) He tolerated treatment much better compared to his prior chemotherapy. Pain control has improved. CT scan is hard  to interpret but overall show persistent disease but no new metastatic disease I will resume retreatment today without delay. I plan to repeat imaging study after 3 more cycles of chemotherapy to assess response to treatment, next due around August 2017 I will get his case presented at the next ENT tumor board to review imaging study.  Tobacco abuse I continue to encourage him to quit smoking.     Tracheostomy status ([place]) I recommend close follow-up with ENT. It does not look infected    Pancytopenia due to antineoplastic chemotherapy [facility]) This is likely due to recent treatment. 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 He is not symptomatic. Observe for now.  Cancer associated pain This is overall improved. I recommend tapering him off prednisone therapy. He can continue to take morphine as needed for pain   No orders of the defined types were placed in this encounter.   All questions were answered. The patient knows to call the clinic with any problems, questions or concerns. No barriers to learning was detected. I spent 25 minutes counseling the patient face to face. The total time spent in the appointment was 30 minutes and more than 50% was on counseling and review of test results     [facility], [place], MD 06/07/2016 8:31 AM

## 2016-06-07 NOTE — Assessment & Plan Note (Signed)
I continue to encourage him to quit smoking.

## 2016-06-07 NOTE — Assessment & Plan Note (Signed)
This is likely due to recent treatment. No need transfusion needed Monitor carefully

## 2016-06-07 NOTE — Assessment & Plan Note (Signed)
This is overall improved. I recommend tapering him off prednisone therapy. He can continue to take morphine as needed for pain

## 2016-06-07 NOTE — Assessment & Plan Note (Signed)
According to CT imaging, he has persistent disease. He is not symptomatic. Observe for now.

## 2016-06-07 NOTE — Assessment & Plan Note (Signed)
He tolerated treatment much better compared to his prior chemotherapy. Pain control has improved. CT scan is hard to interpret but overall show persistent disease but no new metastatic disease I will resume retreatment today without delay. I plan to repeat imaging study after 3 more cycles of chemotherapy to assess response to treatment, next due around August 2017 I will get his case presented at the next ENT tumor board to review imaging study.

## 2016-06-07 NOTE — Assessment & Plan Note (Signed)
I recommend close follow-up with ENT. It does not look infected

## 2016-06-22 MED FILL — LEVOTHYROXINE 25 MCG TABLET: 25 | 30 days supply | Qty: 30 | Fill #2

## 2016-06-27 ENCOUNTER — Other Ambulatory Visit: Payer: Self-pay | Admitting: *Deleted

## 2016-06-27 ENCOUNTER — Encounter: Payer: Self-pay | Admitting: Hematology and Oncology

## 2016-06-27 ENCOUNTER — Ambulatory Visit (HOSPITAL_BASED_OUTPATIENT_CLINIC_OR_DEPARTMENT_OTHER): Payer: Medicaid Other | Admitting: Hematology and Oncology

## 2016-06-27 ENCOUNTER — Telehealth: Payer: Self-pay | Admitting: Hematology and Oncology

## 2016-06-27 ENCOUNTER — Other Ambulatory Visit (HOSPITAL_BASED_OUTPATIENT_CLINIC_OR_DEPARTMENT_OTHER): Payer: Medicaid Other

## 2016-06-27 ENCOUNTER — Ambulatory Visit (HOSPITAL_BASED_OUTPATIENT_CLINIC_OR_DEPARTMENT_OTHER): Payer: Medicaid Other

## 2016-06-27 ENCOUNTER — Ambulatory Visit: Payer: Medicaid Other

## 2016-06-27 VITALS — BP 127/77 | HR 74 | Temp 98.5°F | Resp 18 | Ht 70.0 in | Wt 156.2 lb

## 2016-06-27 DIAGNOSIS — C321 Malignant neoplasm of supraglottis: Secondary | ICD-10-CM

## 2016-06-27 DIAGNOSIS — Z720 Tobacco use: Secondary | ICD-10-CM

## 2016-06-27 DIAGNOSIS — Z79899 Other long term (current) drug therapy: Secondary | ICD-10-CM

## 2016-06-27 DIAGNOSIS — G893 Neoplasm related pain (acute) (chronic): Secondary | ICD-10-CM | POA: Diagnosis not present

## 2016-06-27 DIAGNOSIS — Z5111 Encounter for antineoplastic chemotherapy: Secondary | ICD-10-CM | POA: Diagnosis not present

## 2016-06-27 DIAGNOSIS — R5383 Other fatigue: Secondary | ICD-10-CM

## 2016-06-27 DIAGNOSIS — Z931 Gastrostomy status: Secondary | ICD-10-CM | POA: Diagnosis not present

## 2016-06-27 LAB — CBC WITH DIFFERENTIAL/PLATELET
BASO%: 0.1 % (ref 0.0–2.0)
BASOS ABS: 0 10*3/uL (ref 0.0–0.1)
EOS%: 0.2 % (ref 0.0–7.0)
Eosinophils Absolute: 0 10*3/uL (ref 0.0–0.5)
HCT: 35.5 % — ABNORMAL LOW (ref 38.4–49.9)
HGB: 12 g/dL — ABNORMAL LOW (ref 13.0–17.1)
LYMPH%: 4.3 % — AB (ref 14.0–49.0)
MCH: 35.1 pg — AB (ref 27.2–33.4)
MCHC: 33.9 g/dL (ref 32.0–36.0)
MCV: 103.8 fL — ABNORMAL HIGH (ref 79.3–98.0)
MONO#: 1 10*3/uL — ABNORMAL HIGH (ref 0.1–0.9)
MONO%: 7.6 % (ref 0.0–14.0)
NEUT#: 11.2 10*3/uL — ABNORMAL HIGH (ref 1.5–6.5)
NEUT%: 87.8 % — AB (ref 39.0–75.0)
Platelets: 177 10*3/uL (ref 140–400)
RBC: 3.42 10*6/uL — AB (ref 4.20–5.82)
RDW: 15.5 % — ABNORMAL HIGH (ref 11.0–14.6)
WBC: 12.7 10*3/uL — ABNORMAL HIGH (ref 4.0–10.3)
lymph#: 0.5 10*3/uL — ABNORMAL LOW (ref 0.9–3.3)

## 2016-06-27 LAB — TSH: TSH: 2.168 m[IU]/L (ref 0.320–4.118)

## 2016-06-27 LAB — COMPREHENSIVE METABOLIC PANEL
ALT: 16 U/L (ref 0–55)
AST: 14 U/L (ref 5–34)
Albumin: 3.6 g/dL (ref 3.5–5.0)
Alkaline Phosphatase: 44 U/L (ref 40–150)
Anion Gap: 9 mEq/L (ref 3–11)
BUN: 16.7 mg/dL (ref 7.0–26.0)
CALCIUM: 9.4 mg/dL (ref 8.4–10.4)
CHLORIDE: 100 meq/L (ref 98–109)
CO2: 30 meq/L — AB (ref 22–29)
Creatinine: 0.7 mg/dL (ref 0.7–1.3)
EGFR: >90 mL/min/{1.73_m2} (ref >90)
GLUCOSE: 101 mg/dL (ref 70–140)
POTASSIUM: 3.8 meq/L (ref 3.5–5.1)
SODIUM: 139 meq/L (ref 136–145)
Total Bilirubin: 0.3 mg/dL (ref 0.20–1.20)
Total Protein: 6.8 g/dL (ref 6.4–8.3)

## 2016-06-27 MED ORDER — SODIUM CHLORIDE 0.9% FLUSH
10.0000 mL | INTRAVENOUS | Status: DC | PRN
  Administered 2016-06-27: 10 mL
  Filled 2016-06-27: qty 10

## 2016-06-27 MED ORDER — FLUTICASONE PROPIONATE 50 MCG/ACT NA SUSP: 2.0000 | Freq: Every day | NASAL | Status: DC

## 2016-06-27 MED ORDER — HEPARIN SOD (PORK) LOCK FLUSH 100 UNIT/ML IV SOLN
500.0000 [IU] | Freq: Once | INTRAVENOUS | Status: AC | PRN
  Administered 2016-06-27: 500 [IU]
  Filled 2016-06-27: qty 5

## 2016-06-27 MED ORDER — SODIUM CHLORIDE 0.9 % IV SOLN
200.0000 mg | Freq: Once | INTRAVENOUS | Status: AC
  Administered 2016-06-27: 200 mg via INTRAVENOUS
  Filled 2016-06-27: qty 8

## 2016-06-27 MED ORDER — PREDNISONE 5 MG PO TABS: 5.0000 mg | ORAL_TABLET | Freq: Every day | ORAL | Status: DC

## 2016-06-27 MED ORDER — MORPHINE SULFATE 30 MG PO TABS: 30.0000 mg | ORAL_TABLET | ORAL | Status: DC | PRN

## 2016-06-27 MED ORDER — SODIUM CHLORIDE 0.9 % IV SOLN
Freq: Once | INTRAVENOUS | Status: AC
  Administered 2016-06-27: 11:00:00 via INTRAVENOUS

## 2016-06-27 MED ORDER — SODIUM CHLORIDE 0.9 % IJ SOLN
10.0000 mL | INTRAMUSCULAR | Status: DC | PRN
  Administered 2016-06-27: 10 mL
  Filled 2016-06-27: qty 10

## 2016-06-27 MED FILL — FLUTICASONE PROP 50 MCG SPR: 50 | 30 days supply | Qty: 16 | Fill #0

## 2016-06-27 MED FILL — MORPHINE SULFATE IR 30 MG T: 30 | 15 days supply | Qty: 90 | Fill #0

## 2016-06-27 MED FILL — predniSONE 5 MG TABS: 5 | 30 days supply | Qty: 30 | Fill #0

## 2016-06-27 NOTE — Patient Instructions (Signed)
[facility] Discharge Instructions for Patients Receiving Chemotherapy  Today you received the following chemotherapy agents: Keytruda   To help prevent nausea and vomiting after your treatment, we encourage you to take your nausea medication as directed    If you develop nausea and vomiting that is not controlled by your nausea medication, call the clinic.   BELOW ARE SYMPTOMS THAT SHOULD BE REPORTED IMMEDIATELY:  *FEVER GREATER THAN 100.5 F  *CHILLS WITH OR WITHOUT FEVER  NAUSEA AND VOMITING THAT IS NOT CONTROLLED WITH YOUR NAUSEA MEDICATION  *UNUSUAL SHORTNESS OF BREATH  *UNUSUAL BRUISING OR BLEEDING  TENDERNESS IN MOUTH AND THROAT WITH OR WITHOUT PRESENCE OF ULCERS  *URINARY PROBLEMS  *BOWEL PROBLEMS  UNUSUAL RASH Items with * indicate a potential emergency and should be followed up as soon as possible.  Feel free to call the clinic you have any questions or concerns. The clinic phone number is [phone].  Please show the CHEMO ALERT CARD at check-in to the Emergency Department and triage nurse.   

## 2016-06-27 NOTE — Progress Notes (Signed)
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[place])   03/15/2015 - 03/17/2015 Hospital Admission He was admitted to the hospital for evaluation of dysphagia, SOB, hemoptosis, hoarseness, 30-40 pound weight loss and worsening bilateral neck masses for 5 months   03/15/2015 Imaging Ct showed extensive circumferential malignancy in the hypopharyngeal/supraglottic region with regional LN metastases   03/16/2015 Procedure He underwent ULTRASOUND-GUIDED BIOPSY OF LEFT CERVICAL LYMPH NODES   03/16/2015 Pathology Results Accession: [id number] LN biopsy showed invasive squamous cell cancer   03/16/2015 Pathology Results Accession: [id number] showed atypical squamous cells   03/25/2015 - 04/07/2015 Hospital Admission He was admitted to the hospital and underwent tracheostomy placement, feeeding tube placement but subsequently left [facility]   03/26/2015 Surgery He had multiple extraction of tooth numbers 1, 2, 5, 6, 7, 8, 9, 10, 11, 12, 13, 18, 19, 21, 22, 23, 24, 25, 26, 27, 28, and 29. and 4 Quadrants of alveoloplasty   03/26/2015 Surgery He underwent tracheostomy   03/31/2015 Surgery He had open gastrostomy tube placement by Dr. Donne Hazel   04/16/2015 - 05/18/2015 Radiation Therapy Laryngopharynx and bilateral neck / 50 Gy in 20 fractions to gross disease, 45 Gy in 20 fractions to high risk nodal echelons  Beams/energy: Helical IMRT / 6 MV photons   04/16/2015 Procedure Fluoroscopic reposition of the 18 French gastrostomy confirmed back in the stomach,   07/03/2015 Procedure IR performed replacement of gastrostomy tube with a new 44 French balloon retention tube   10/01/2015 Imaging PEt  scan showed persistent hypermetabolism within the primary supraglottic laryngeal tumor and within right retropharyngeal, bilateral level II and left level IV cervical nodal metastases   10/28/2015 Procedure He had placement of PICC line. The IR was not able to place PORT due to suspected upper respiratory infection   11/13/2015 - 03/21/2016 Chemotherapy He received 5FU, carboplatin chemo with weekly Erbitux   11/19/2015 Surgery Gastrostomy tube replaced.   11/30/2015 Procedure Placement of right jugular port-a-cath.   11/30/2015 Procedure PICC removed.   12/30/2015 Imaging PET CT showed positive response to Rx   02/26/2016 Procedure He underwent direct laryngoscopy with biopsy. Esophageal dilatation.    02/26/2016 Pathology Results Repeat biopsy of supraglottis showed persistent disease   02/29/2016 Procedure Gastrostomy tube exchanged.   03/28/2016 PET scan Hypermetabolic tissue in the posterior RIGHT hypopharynx is similar in pattern to PET-CT of 12/30/2015 but increased in metabolic activity.2. Hypermetabolic tissue / lymph nodes in the LEFT supraclavicular nodal station are in a similar pattern    04/04/2016 -  Chemotherapy He started on palliative chemotherapy with pembrolizumab   05/03/2016 Imaging MRI head is negative   06/02/2016 Imaging Mild improvement of diffuse pharyngeal and supraglottic edema.Increased asymmetry of right-sided hypopharyngeal/supraglottic softtissue compared to the prior neck CT with FDG uptake in this region on prior PET-CT. Residual/recurrent tumor is possible    INTERVAL HISTORY: Please see below for problem oriented charting. He is seen prior to chemotherapy. He had recent intermittent ear aches. He continues to gain weight. He is not using MS Contin as much and his pain is under good control. Denies recent infection. No recent hemoptysis.  REVIEW OF SYSTEMS:   Constitutional: Denies fevers, chills or abnormal weight loss  Eyes: Denies blurriness of vision Ears,  nose, mouth, throat, and face: Denies mucositis or sore throat Respiratory: Denies cough, dyspnea or wheezes Cardiovascular: Denies palpitation, chest discomfort or lower extremity swelling Gastrointestinal:  Denies nausea, heartburn or change in bowel habits Skin: Denies abnormal skin rashes Lymphatics: Denies new lymphadenopathy or easy bruising Neurological:Denies numbness, tingling or new weaknesses Behavioral/Psych: Mood is stable, no new changes  All other systems were reviewed with the patient and are negative.  I have reviewed the past medical history, past surgical history, social history and family history with the patient and they are unchanged from previous note.  ALLERGIES:  is allergic to aspirin.  MEDICATIONS:  Current Outpatient Prescriptions  Medication Sig Dispense Refill  . lansoprazole (PREVACID) 30 MG capsule Take 1 capsule (30 mg total) by mouth 2 (two) times daily before a meal. 60 capsule 6  . levothyroxine (LEVOTHROID) 25 MCG tablet Take 1 tablet (25 mcg total) by mouth daily before breakfast. 30 tablet 3  . lidocaine (XYLOCAINE) 2 % solution Use as directed 15 mLs in the mouth or throat every 8 (eight) hours as needed for mouth pain. 100 mL 0  . morphine (MS CONTIN) 30 MG 12 hr tablet Take 1 tablet (30 mg total) by mouth every 12 (twelve) hours. 60 tablet 0  . morphine (MSIR) 30 MG tablet Take 1 tablet (30 mg total) by mouth every 4 (four) hours as needed for severe pain. 90 tablet 0  . Nutritional Supplements (FEEDING SUPPLEMENT, JEVITY 1.5 CAL/FIBER,) LIQD Give 1 can Osmolite 1.2 + 1 can of Jevity 1.5 QID via PEG with 60 cc free water before and after bolus. Flush with 240 cc free water BID between feedings. 948 mL   . predniSONE (DELTASONE) 20 MG tablet Take 1 tablet (20 mg total) by mouth daily with breakfast. 90 tablet 0  . SODIUM CHLORIDE, EXTERNAL, 0.9 % SOLN Use to clean around Trach and perform Trach care once daily and PRN 1000 mL 11  . fluticasone  (FLONASE) 50 MCG/ACT nasal spray Place 2 sprays into both nostrils daily. 16 g 2  . guaiFENesin (ROBITUSSIN) 100 MG/5ML SOLN Take 5 mLs (100 mg total) by mouth every 4 (four) hours as needed for cough or to loosen phlegm. (Patient not taking: Reported on 06/27/2016) 473 mL 3  . LORazepam (ATIVAN) 1 MG tablet Take 1 tablet (1 mg total) by mouth 2 (two) times daily as needed for anxiety (or nausea). (Patient not taking: Reported on 06/27/2016) 30 tablet 0  . metoCLOPramide (REGLAN) 5 MG/5ML solution Place 5 mLs (5 mg total) into feeding tube 4 (four) times daily -  before meals and at bedtime. (Patient not taking: Reported on 06/27/2016) 240 mL 1  . omeprazole (PRILOSEC) 20 MG capsule Take 1 capsule (20 mg total) by mouth 2 (two) times daily. (Patient not taking: Reported on 06/27/2016) 60 capsule 0  . ondansetron (ZOFRAN) 8 MG tablet Take 1 tablet (8 mg total) by mouth every 8 (eight) hours as needed for nausea. (Patient not taking: Reported on 06/27/2016) 60 tablet 3  . polyethylene glycol (MIRALAX) packet Take 17 g by mouth daily. (Patient not taking: Reported on 06/27/2016) 14 each 0  . predniSONE (DELTASONE) 5 MG tablet Take 1 tablet (5 mg total) by mouth daily with breakfast. 30 tablet 1  . zolpidem (AMBIEN) 5 MG tablet Take 1 tablet (5 mg total) by mouth at bedtime as needed for sleep. (Patient not taking: Reported on 06/27/2016) 30 tablet 0   No  current facility-administered medications for this visit.   Facility-Administered Medications Ordered in Other Visits  Medication Dose Route Frequency Provider Last Rate Last Dose  . heparin lock flush 100 unit/mL  500 Units Intracatheter Once PRN [name], MD      . pembrolizumab (KEYTRUDA) 200 mg in sodium chloride 0.9 % 50 mL chemo infusion  200 mg Intravenous Once [name], MD      . sodium chloride flush (NS) 0.9 % injection 10 mL  10 mL Intracatheter PRN [name], MD        PHYSICAL EXAMINATION: ECOG PERFORMANCE STATUS: 1 - Symptomatic but  completely ambulatory  Filed Vitals:   06/27/16 1042  BP: 127/77  Pulse: 74  Temp: 98.5 F (36.9 C)  Resp: 18   Filed Weights   06/27/16 1042  Weight: 156 lb 3.2 oz (70.852 kg)    GENERAL:alert, no distress and comfortable SKIN: skin color, texture, turgor are normal, no rashes or significant lesions EYES: normal, Conjunctiva are pink and non-injected, sclera clear OROPHARYNX:no exudate, no erythema and lips, buccal mucosa, and tongue normal  NECK: Tracheostomy in situ. Previous swelling around his neck has improved  LYMPH:  no palpable lymphadenopathy in the cervical, axillary or inguinal LUNGS: clear to auscultation and percussion with normal breathing effort HEART: regular rate & rhythm and no murmurs and no lower extremity edema ABDOMEN:abdomen soft, non-tender and normal bowel sounds. Feeding tube site looks okay Musculoskeletal:no cyanosis of digits and no clubbing  NEURO: alert & oriented x 3 with fluent speech, no focal motor/sensory deficits  LABORATORY DATA:  I have reviewed the data as listed    Component Value Date/Time   NA 139 06/27/2016 0943   NA 140 01/02/2016 1433   K 3.8 06/27/2016 0943   K 4.3 01/02/2016 1433   CL 99* 01/02/2016 1433   CO2 30* 06/27/2016 0943   CO2 29 01/02/2016 1422   GLUCOSE 101 06/27/2016 0943   GLUCOSE 117* 01/02/2016 1433   BUN 16.7 06/27/2016 0943   BUN 17 01/02/2016 1433   CREATININE 0.7 06/27/2016 0943   CREATININE 0.80 01/02/2016 1433   CALCIUM 9.4 06/27/2016 0943   CALCIUM 9.8 01/02/2016 1422   PROT 6.8 06/27/2016 0943   PROT 7.4 01/02/2016 1422   ALBUMIN 3.6 06/27/2016 0943   ALBUMIN 3.9 01/02/2016 1422   AST 14 06/27/2016 0943   AST 28 01/02/2016 1422   ALT 16 06/27/2016 0943   ALT 15* 01/02/2016 1422   ALKPHOS 44 06/27/2016 0943   ALKPHOS 56 01/02/2016 1422   BILITOT 0.30 06/27/2016 0943   BILITOT 0.8 01/02/2016 1422   GFRNONAA >60 01/02/2016 1422   GFRAA >60 01/02/2016 1422    No results found for: SPEP,  UPEP  Lab Results  Component Value Date   WBC 12.7* 06/27/2016   NEUTROABS 11.2* 06/27/2016   HGB 12.0* 06/27/2016   HCT 35.5* 06/27/2016   MCV 103.8* 06/27/2016   PLT 177 06/27/2016      Chemistry      Component Value Date/Time   NA 139 06/27/2016 0943   NA 140 01/02/2016 1433   K 3.8 06/27/2016 0943   K 4.3 01/02/2016 1433   CL 99* 01/02/2016 1433   CO2 30* 06/27/2016 0943   CO2 29 01/02/2016 1422   BUN 16.7 06/27/2016 0943   BUN 17 01/02/2016 1433   CREATININE 0.7 06/27/2016 0943   CREATININE 0.80 01/02/2016 1433      Component Value Date/Time   CALCIUM 9.4 06/27/2016 0943  CALCIUM 9.8 01/02/2016 1422   ALKPHOS 44 06/27/2016 0943   ALKPHOS 56 01/02/2016 1422   AST 14 06/27/2016 0943   AST 28 01/02/2016 1422   ALT 16 06/27/2016 0943   ALT 15* 01/02/2016 1422   BILITOT 0.30 06/27/2016 0943   BILITOT 0.8 01/02/2016 1422     ASSESSMENT & PLAN:  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[facility]) He tolerated treatment much better compared to his prior chemotherapy. Pain control has improved. Recent CT scan is hard to interpret but overall show persistent disease but no new metastatic disease I plan to repeat imaging study after 3 more cycles of chemotherapy to assess response to treatment, next due around August 2017  Cancer associated pain This is overall improved. I recommend tapering him off MS Contin and prednisone therapy. He can continue to take IR morphine as needed for pain    Gastrostomy tube in place [facility]) The feeding tube looks fine It was replaced recently and appears to be functioning well.    Tobacco abuse I continue to encourage him to quit smoking. He is making good effort and only smokes half a cigarette yesterday.     No orders of the defined types were placed in this encounter.   All questions were answered. The patient knows to call the clinic with any problems, questions or concerns. No barriers to learning was detected. I spent 20  minutes counseling the patient face to face. The total time spent in the appointment was 30 minutes and more than 50% was on counseling and review of test results     [facility], [place], MD 06/27/2016 11:14 AM

## 2016-06-27 NOTE — Patient Instructions (Signed)

## 2016-06-27 NOTE — Assessment & Plan Note (Signed)
This is overall improved. I recommend tapering him off MS Contin and prednisone therapy. He can continue to take IR morphine as needed for pain

## 2016-06-27 NOTE — Assessment & Plan Note (Signed)
The feeding tube looks fine It was replaced recently and appears to be functioning well.

## 2016-06-27 NOTE — Assessment & Plan Note (Signed)
He tolerated treatment much better compared to his prior chemotherapy. Pain control has improved. Recent CT scan is hard to interpret but overall show persistent disease but no new metastatic disease I plan to repeat imaging study after 3 more cycles of chemotherapy to assess response to treatment, next due around August 2017

## 2016-06-27 NOTE — Telephone Encounter (Signed)
Gave pt cal & avs °

## 2016-06-27 NOTE — Assessment & Plan Note (Signed)
I continue to encourage him to quit smoking. He is making good effort and only smokes half a cigarette yesterday.

## 2016-07-18 ENCOUNTER — Ambulatory Visit (HOSPITAL_BASED_OUTPATIENT_CLINIC_OR_DEPARTMENT_OTHER): Payer: Medicaid Other | Admitting: Hematology and Oncology

## 2016-07-18 ENCOUNTER — Encounter: Payer: Self-pay | Admitting: Hematology and Oncology

## 2016-07-18 ENCOUNTER — Ambulatory Visit (HOSPITAL_BASED_OUTPATIENT_CLINIC_OR_DEPARTMENT_OTHER): Payer: Medicaid Other

## 2016-07-18 ENCOUNTER — Ambulatory Visit: Payer: Medicaid Other | Admitting: Nutrition

## 2016-07-18 ENCOUNTER — Ambulatory Visit: Payer: Medicaid Other

## 2016-07-18 ENCOUNTER — Telehealth: Payer: Self-pay | Admitting: Hematology and Oncology

## 2016-07-18 ENCOUNTER — Other Ambulatory Visit (HOSPITAL_BASED_OUTPATIENT_CLINIC_OR_DEPARTMENT_OTHER): Payer: Medicaid Other

## 2016-07-18 DIAGNOSIS — E43 Unspecified severe protein-calorie malnutrition: Secondary | ICD-10-CM | POA: Diagnosis not present

## 2016-07-18 DIAGNOSIS — C321 Malignant neoplasm of supraglottis: Secondary | ICD-10-CM

## 2016-07-18 DIAGNOSIS — R5383 Other fatigue: Secondary | ICD-10-CM

## 2016-07-18 DIAGNOSIS — Z79899 Other long term (current) drug therapy: Secondary | ICD-10-CM

## 2016-07-18 DIAGNOSIS — Z931 Gastrostomy status: Secondary | ICD-10-CM

## 2016-07-18 DIAGNOSIS — Z430 Encounter for attention to tracheostomy: Secondary | ICD-10-CM | POA: Diagnosis not present

## 2016-07-18 DIAGNOSIS — G893 Neoplasm related pain (acute) (chronic): Secondary | ICD-10-CM

## 2016-07-18 DIAGNOSIS — Z5112 Encounter for antineoplastic immunotherapy: Secondary | ICD-10-CM | POA: Diagnosis present

## 2016-07-18 LAB — COMPREHENSIVE METABOLIC PANEL
ALBUMIN: 3.8 g/dL (ref 3.5–5.0)
ALK PHOS: 47 U/L (ref 40–150)
ALT: 21 U/L (ref 0–55)
ANION GAP: 10 meq/L (ref 3–11)
AST: 15 U/L (ref 5–34)
BILIRUBIN TOTAL: 0.58 mg/dL (ref 0.20–1.20)
BUN: 19.5 mg/dL (ref 7.0–26.0)
CALCIUM: 10.1 mg/dL (ref 8.4–10.4)
CO2: 27 mEq/L (ref 22–29)
Chloride: 103 mEq/L (ref 98–109)
Creatinine: 0.7 mg/dL (ref 0.7–1.3)
Glucose: 119 mg/dl (ref 70–140)
Potassium: 3.7 mEq/L (ref 3.5–5.1)
Sodium: 141 mEq/L (ref 136–145)
TOTAL PROTEIN: 7.4 g/dL (ref 6.4–8.3)

## 2016-07-18 LAB — CBC WITH DIFFERENTIAL/PLATELET
BASO%: 0.2 % (ref 0.0–2.0)
BASOS ABS: 0 10*3/uL (ref 0.0–0.1)
EOS ABS: 0 10*3/uL (ref 0.0–0.5)
EOS%: 0.4 % (ref 0.0–7.0)
HCT: 40.8 % (ref 38.4–49.9)
HEMOGLOBIN: 13.4 g/dL (ref 13.0–17.1)
LYMPH%: 4 % — AB (ref 14.0–49.0)
MCH: 34.1 pg — AB (ref 27.2–33.4)
MCHC: 32.8 g/dL (ref 32.0–36.0)
MCV: 104 fL — AB (ref 79.3–98.0)
MONO#: 1 10*3/uL — AB (ref 0.1–0.9)
MONO%: 8.1 % (ref 0.0–14.0)
NEUT%: 87.3 % — ABNORMAL HIGH (ref 39.0–75.0)
NEUTROS ABS: 10.3 10*3/uL — AB (ref 1.5–6.5)
PLATELETS: 184 10*3/uL (ref 140–400)
RBC: 3.92 10*6/uL — ABNORMAL LOW (ref 4.20–5.82)
RDW: 15.8 % — AB (ref 11.0–14.6)
WBC: 11.8 10*3/uL — AB (ref 4.0–10.3)
lymph#: 0.5 10*3/uL — ABNORMAL LOW (ref 0.9–3.3)

## 2016-07-18 LAB — TECHNOLOGIST REVIEW

## 2016-07-18 LAB — TSH: TSH: 2.978 m[IU]/L (ref 0.320–4.118)

## 2016-07-18 MED ORDER — SODIUM CHLORIDE 0.9 % IV SOLN
Freq: Once | INTRAVENOUS | Status: AC
  Administered 2016-07-18: 15:00:00 via INTRAVENOUS

## 2016-07-18 MED ORDER — SODIUM CHLORIDE 0.9 % IJ SOLN
10.0000 mL | INTRAMUSCULAR | Status: DC | PRN
  Administered 2016-07-18: 10 mL
  Filled 2016-07-18: qty 10

## 2016-07-18 MED ORDER — HEPARIN SOD (PORK) LOCK FLUSH 100 UNIT/ML IV SOLN
500.0000 [IU] | Freq: Once | INTRAVENOUS | Status: AC | PRN
  Administered 2016-07-18: 500 [IU]
  Filled 2016-07-18: qty 5

## 2016-07-18 MED ORDER — SODIUM CHLORIDE 0.9% FLUSH
10.0000 mL | INTRAVENOUS | Status: DC | PRN
  Administered 2016-07-18: 10 mL
  Filled 2016-07-18: qty 10

## 2016-07-18 MED ORDER — SODIUM CHLORIDE 0.9 % IV SOLN
200.0000 mg | Freq: Once | INTRAVENOUS | Status: AC
  Administered 2016-07-18: 200 mg via INTRAVENOUS
  Filled 2016-07-18: qty 8

## 2016-07-18 MED FILL — LEVOTHYROXINE 25 MCG TABLET: 25 | 30 days supply | Qty: 30 | Fill #3

## 2016-07-18 MED FILL — OMEPRAZOLE DR 20 MG CAPSULE: 20 | 30 days supply | Qty: 60 | Fill #4

## 2016-07-18 NOTE — Assessment & Plan Note (Signed)
He tolerated treatment much better compared to his prior chemotherapy. Pain control has improved. Recent CT scan is hard to interpret but overall show persistent disease but no new metastatic disease I plan to repeat imaging study after 3-4 more cycles of chemotherapy to assess response to treatment, next due around end of August 2017

## 2016-07-18 NOTE — Assessment & Plan Note (Signed)
He has cancer cachexia. I have started him on low-dose prednisone to help with pain and also see if we can help with appetite. He is doing better and is gaining weight. I recommend he start weaning off by reducing prednisone to 5 mg daily We will get dietitian to follow closely. Continue to encourage him to take nutritional supplements on the regular basis

## 2016-07-18 NOTE — Assessment & Plan Note (Signed)
This is overall improved. He can continue to take IR morphine as needed for pain

## 2016-07-18 NOTE — Progress Notes (Signed)
Nutrition follow-up completed with patient during infusion for cancer of the supraglottis. Weight improved documented as 155.4 pounds on August 7 increased from 147.9 pounds. Patient denies nutrition impact symptoms. Patient is using a combination Osmolite 1.2 and Jevity 1.5 providing 2560 cal, 113 g protein, 2220 mL free water.  This meets greater than 100% of estimated nutrition needs.  Nutrition diagnosis: Unintended weight loss resolved.  Intervention: Patient educated to continue strategies for adequate enteral nutrition.  Monitoring, evaluation, goals: Patient will continue to tolerate tube feeding to meet greater than 90% of estimated needs.  Next visit: To be scheduled as needed.

## 2016-07-18 NOTE — Patient Instructions (Signed)

## 2016-07-18 NOTE — Assessment & Plan Note (Signed)
I recommend close follow-up with ENT. It does not look infected but the patient thought it felt "clogged"

## 2016-07-18 NOTE — Telephone Encounter (Signed)
Made appt for 8/28. Pt will get appts in chemo.

## 2016-07-18 NOTE — Progress Notes (Signed)
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[place])   03/15/2015 - 03/17/2015 Hospital Admission    He was admitted to the hospital for evaluation of dysphagia, SOB, hemoptosis, hoarseness, 30-40 pound weight loss and worsening bilateral neck masses for 5 months     03/15/2015 Imaging    Ct showed extensive circumferential malignancy in the hypopharyngeal/supraglottic region with regional LN metastases     03/16/2015 Procedure    He underwent ULTRASOUND-GUIDED BIOPSY OF LEFT CERVICAL LYMPH NODES     03/16/2015 Pathology Results    Accession: [id number] LN biopsy showed invasive squamous cell cancer     03/16/2015 Pathology Results    Accession: [id number] showed atypical squamous cells     03/25/2015 - 04/07/2015 Hospital Admission    He was admitted to the hospital and underwent tracheostomy placement, feeeding tube placement but subsequently left [facility]     03/26/2015 Surgery    He had multiple extraction of tooth numbers 1, 2, 5, 6, 7, 8, 9, 10, 11, 12, 13, 18, 19, 21, 22, 23, 24, 25, 26, 27, 28, and 29. and 4 Quadrants of alveoloplasty     03/26/2015 Surgery    He underwent tracheostomy     03/31/2015 Surgery    He had open gastrostomy tube placement by Dr. Donne Hazel     04/16/2015 - 05/18/2015 Radiation Therapy    Laryngopharynx and bilateral neck / 50 Gy in 20 fractions to gross disease, 45 Gy in 20 fractions to high risk nodal echelons  Beams/energy: Helical IMRT / 6 MV photons     04/16/2015 Procedure    Fluoroscopic reposition of the 18 French gastrostomy confirmed back in the stomach,     07/03/2015 Procedure    IR performed replacement of  gastrostomy tube with a new 65 French balloon retention tube     10/01/2015 Imaging    PEt scan showed persistent hypermetabolism within the primary supraglottic laryngeal tumor and within right retropharyngeal, bilateral level II and left level IV cervical nodal metastases     10/28/2015 Procedure    He had placement of PICC line. The IR was not able to place PORT due to suspected upper respiratory infection     11/13/2015 - 03/21/2016 Chemotherapy    He received 5FU, carboplatin chemo with weekly Erbitux     11/19/2015 Surgery    Gastrostomy tube replaced.     11/30/2015 Procedure    Placement of right jugular port-a-cath.     11/30/2015 Procedure    PICC removed.     12/30/2015 Imaging    PET CT showed positive response to Rx     02/26/2016 Procedure    He underwent direct laryngoscopy with biopsy. Esophageal dilatation.      02/26/2016 Pathology Results    Repeat biopsy of supraglottis showed persistent disease     02/29/2016 Procedure    Gastrostomy tube exchanged.     03/28/2016 PET scan    Hypermetabolic tissue in the posterior RIGHT hypopharynx is similar in pattern to PET-CT of 12/30/2015 but increased in metabolic activity.2. Hypermetabolic tissue / lymph nodes in the LEFT supraclavicular nodal station are in a similar pattern      04/04/2016 -  Chemotherapy  He started on palliative chemotherapy with pembrolizumab     05/03/2016 Imaging    MRI head is negative     06/02/2016 Imaging    Mild improvement of diffuse pharyngeal and supraglottic edema.Increased asymmetry of right-sided hypopharyngeal/supraglottic softtissue compared to the prior neck CT with FDG uptake in this region on prior PET-CT. Residual/recurrent tumor is possible      INTERVAL HISTORY: Please see below for problem oriented charting. He feels well. Pain is not an issue. He felt that he has some congestion and difficulty clearing his secretions this past weekend. Denies fever or chills. His  appetite continues to improve while on prednisone therapy. He denies recent cough. He denies recent smoking  REVIEW OF SYSTEMS:   Constitutional: Denies fevers, chills or abnormal weight loss Eyes: Denies blurriness of vision Ears, nose, mouth, throat, and face: Denies mucositis or sore throat Respiratory: Denies cough, dyspnea or wheezes Cardiovascular: Denies palpitation, chest discomfort or lower extremity swelling Gastrointestinal:  Denies nausea, heartburn or change in bowel habits Skin: Denies abnormal skin rashes Lymphatics: Denies new lymphadenopathy or easy bruising Neurological:Denies numbness, tingling or new weaknesses Behavioral/Psych: Mood is stable, no new changes  All other systems were reviewed with the patient and are negative.  I have reviewed the past medical history, past surgical history, social history and family history with the patient and they are unchanged from previous note.  ALLERGIES:  is allergic to aspirin.  MEDICATIONS:  Current Outpatient Prescriptions  Medication Sig Dispense Refill  . fluticasone (FLONASE) 50 MCG/ACT nasal spray Place 2 sprays into both nostrils daily. 16 g 2  . guaiFENesin (ROBITUSSIN) 100 MG/5ML SOLN Take 5 mLs (100 mg total) by mouth every 4 (four) hours as needed for cough or to loosen phlegm. (Patient not taking: Reported on 06/27/2016) 473 mL 3  . lansoprazole (PREVACID) 30 MG capsule Take 1 capsule (30 mg total) by mouth 2 (two) times daily before a meal. 60 capsule 6  . levothyroxine (LEVOTHROID) 25 MCG tablet Take 1 tablet (25 mcg total) by mouth daily before breakfast. 30 tablet 3  . lidocaine (XYLOCAINE) 2 % solution Use as directed 15 mLs in the mouth or throat every 8 (eight) hours as needed for mouth pain. 100 mL 0  . LORazepam (ATIVAN) 1 MG tablet Take 1 tablet (1 mg total) by mouth 2 (two) times daily as needed for anxiety (or nausea). (Patient not taking: Reported on 06/27/2016) 30 tablet 0  . metoCLOPramide (REGLAN) 5  MG/5ML solution Place 5 mLs (5 mg total) into feeding tube 4 (four) times daily -  before meals and at bedtime. (Patient not taking: Reported on 06/27/2016) 240 mL 1  . morphine (MS CONTIN) 30 MG 12 hr tablet Take 1 tablet (30 mg total) by mouth every 12 (twelve) hours. 60 tablet 0  . morphine (MSIR) 30 MG tablet Take 1 tablet (30 mg total) by mouth every 4 (four) hours as needed for severe pain. 90 tablet 0  . Nutritional Supplements (FEEDING SUPPLEMENT, JEVITY 1.5 CAL/FIBER,) LIQD Give 1 can Osmolite 1.2 + 1 can of Jevity 1.5 QID via PEG with 60 cc free water before and after bolus. Flush with 240 cc free water BID between feedings. 948 mL   . omeprazole (PRILOSEC) 20 MG capsule Take 1 capsule (20 mg total) by mouth 2 (two) times daily. (Patient not taking: Reported on 06/27/2016) 60 capsule 0  . ondansetron (ZOFRAN) 8 MG tablet Take 1 tablet (8 mg total) by mouth every 8 (eight) hours  as needed for nausea. (Patient not taking: Reported on 06/27/2016) 60 tablet 3  . polyethylene glycol (MIRALAX) packet Take 17 g by mouth daily. (Patient not taking: Reported on 06/27/2016) 14 each 0  . predniSONE (DELTASONE) 20 MG tablet Take 1 tablet (20 mg total) by mouth daily with breakfast. 90 tablet 0  . predniSONE (DELTASONE) 5 MG tablet Take 1 tablet (5 mg total) by mouth daily with breakfast. 30 tablet 1  . SODIUM CHLORIDE, EXTERNAL, 0.9 % SOLN Use to clean around Trach and perform Trach care once daily and PRN 1000 mL 11  . zolpidem (AMBIEN) 5 MG tablet Take 1 tablet (5 mg total) by mouth at bedtime as needed for sleep. (Patient not taking: Reported on 06/27/2016) 30 tablet 0   No current facility-administered medications for this visit.     PHYSICAL EXAMINATION: ECOG PERFORMANCE STATUS: 1 - Symptomatic but completely ambulatory  Vitals:   07/18/16 1255  BP: 122/75  Pulse: 74  Resp: 18  Temp: 98.8 F (37.1 C)   Filed Weights   07/18/16 1255  Weight: 155 lb 6.4 oz (70.5 kg)    GENERAL:alert, no  distress and comfortable SKIN: skin color, texture, turgor are normal, no rashes or significant lesions EYES: normal, Conjunctiva are pink and non-injected, sclera clear OROPHARYNX:no exudate, no erythema and lips, buccal mucosa, and tongue normal  NECK: Tracheostomy site looks clean without signs of infection  LYMPH:  no palpable lymphadenopathy in the cervical, axillary or inguinal LUNGS: clear to auscultation and percussion with normal breathing effort HEART: regular rate & rhythm and no murmurs and no lower extremity edema ABDOMEN:abdomen soft, non-tender and normal bowel sounds. Feeding tube site looks okay Musculoskeletal:no cyanosis of digits and no clubbing  NEURO: alert & oriented x 3 with fluent speech, no focal motor/sensory deficits  LABORATORY DATA:  I have reviewed the data as listed    Component Value Date/Time   NA 141 07/18/2016 1226   K 3.7 07/18/2016 1226   CL 99 (L) 01/02/2016 1433   CO2 27 07/18/2016 1226   GLUCOSE 119 07/18/2016 1226   BUN 19.5 07/18/2016 1226   CREATININE 0.7 07/18/2016 1226   CALCIUM 10.1 07/18/2016 1226   PROT 7.4 07/18/2016 1226   ALBUMIN 3.8 07/18/2016 1226   AST 15 07/18/2016 1226   ALT 21 07/18/2016 1226   ALKPHOS 47 07/18/2016 1226   BILITOT 0.58 07/18/2016 1226   GFRNONAA >60 01/02/2016 1422   GFRAA >60 01/02/2016 1422    No results found for: SPEP, UPEP  Lab Results  Component Value Date   WBC 11.8 (H) 07/18/2016   NEUTROABS 10.3 (H) 07/18/2016   HGB 13.4 07/18/2016   HCT 40.8 07/18/2016   MCV 104.0 (H) 07/18/2016   PLT 184 07/18/2016      Chemistry      Component Value Date/Time   NA 141 07/18/2016 1226   K 3.7 07/18/2016 1226   CL 99 (L) 01/02/2016 1433   CO2 27 07/18/2016 1226   BUN 19.5 07/18/2016 1226   CREATININE 0.7 07/18/2016 1226      Component Value Date/Time   CALCIUM 10.1 07/18/2016 1226   ALKPHOS 47 07/18/2016 1226   AST 15 07/18/2016 1226   ALT 21 07/18/2016 1226   BILITOT 0.58 07/18/2016 1226       ASSESSMENT & PLAN:  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[facility]) He tolerated treatment much better compared to his prior chemotherapy. Pain control has improved. Recent CT scan is hard to interpret but  overall show persistent disease but no new metastatic disease I plan to repeat imaging study after 3-4 more cycles of chemotherapy to assess response to treatment, next due around end of August 2017  Tracheostomy care [facility]) I recommend close follow-up with ENT. It does not look infected but the patient thought it felt "clogged"    Cancer associated pain This is overall improved. He can continue to take IR morphine as needed for pain    Gastrostomy tube in place [facility]) The feeding tube looks fine It was replaced recently and appears to be functioning well.    Protein-calorie malnutrition, severe ([place]) He has cancer cachexia. I have started him on low-dose prednisone to help with pain and also see if we can help with appetite. He is doing better and is gaining weight. I recommend he start weaning off by reducing prednisone to 5 mg daily We will get dietitian to follow closely. Continue to encourage him to take nutritional supplements on the regular basis   No orders of the defined types were placed in this encounter.  All questions were answered. The patient knows to call the clinic with any problems, questions or concerns. No barriers to learning was detected. I spent 20 minutes counseling the patient face to face. The total time spent in the appointment was 25 minutes and more than 50% was on counseling and review of test results     [facility], [place], MD 07/18/2016 6:08 PM

## 2016-07-18 NOTE — Assessment & Plan Note (Signed)
The feeding tube looks fine It was replaced recently and appears to be functioning well.

## 2016-07-19 ENCOUNTER — Other Ambulatory Visit: Payer: Self-pay | Admitting: Hematology and Oncology

## 2016-07-19 ENCOUNTER — Ambulatory Visit (HOSPITAL_BASED_OUTPATIENT_CLINIC_OR_DEPARTMENT_OTHER): Payer: Medicaid Other

## 2016-07-19 ENCOUNTER — Telehealth: Payer: Self-pay | Admitting: *Deleted

## 2016-07-19 VITALS — BP 122/83 | HR 73 | Temp 98.7°F | Resp 18

## 2016-07-19 DIAGNOSIS — C321 Malignant neoplasm of supraglottis: Secondary | ICD-10-CM | POA: Diagnosis not present

## 2016-07-19 DIAGNOSIS — E43 Unspecified severe protein-calorie malnutrition: Secondary | ICD-10-CM | POA: Diagnosis not present

## 2016-07-19 MED ORDER — HEPARIN SOD (PORK) LOCK FLUSH 100 UNIT/ML IV SOLN
500.0000 [IU] | Freq: Once | INTRAVENOUS | Status: AC | PRN
  Administered 2016-07-19: 500 [IU]
  Filled 2016-07-19: qty 5

## 2016-07-19 MED ORDER — HYDROMORPHONE HCL 4 MG/ML IJ SOLN
INTRAMUSCULAR | Status: AC
  Filled 2016-07-19: qty 1

## 2016-07-19 MED ORDER — SODIUM CHLORIDE 0.9 % IV SOLN
Freq: Once | INTRAVENOUS | Status: AC
  Administered 2016-07-19: 15:00:00 via INTRAVENOUS
  Filled 2016-07-19: qty 4

## 2016-07-19 MED ORDER — ONDANSETRON HCL 8 MG PO TABS: 8.0000 mg | ORAL_TABLET | Freq: Three times a day (TID) | ORAL | 3 refills | Status: DC | PRN

## 2016-07-19 MED ORDER — SODIUM CHLORIDE 0.9 % IJ SOLN
10.0000 mL | INTRAMUSCULAR | Status: DC | PRN
  Administered 2016-07-19: 10 mL
  Filled 2016-07-19: qty 10

## 2016-07-19 MED ORDER — SODIUM CHLORIDE 0.9 % IV SOLN
Freq: Once | INTRAVENOUS | Status: AC
  Administered 2016-07-19: 15:00:00 via INTRAVENOUS

## 2016-07-19 MED ORDER — HYDROMORPHONE HCL 4 MG/ML IJ SOLN
2.0000 mg | INTRAMUSCULAR | Status: DC | PRN
  Administered 2016-07-19: 2 mg via INTRAVENOUS

## 2016-07-19 MED FILL — ONDANSETRON HCL 8 MG TABLET: 8 | 20 days supply | Qty: 60 | Fill #0

## 2016-07-19 NOTE — Telephone Encounter (Signed)
Pt left VM reports he is "sick as a dog" with nausea and vomiting since last night and all morning.  Called pt and he denies fever.  Says he can't keep any food/ feedings down.   He has taken zofran but it is not helping.  He says it is the same type of n/v he used to have w/ previous chemotherapy treatment.   Dr. Alvy Bimler ordered IVFs and antiemetics today.  Pt added onto infusion room schedule at 2:25 pm.   Pt aware and will come in for IVFs this afternoon.

## 2016-07-19 NOTE — Patient Instructions (Signed)
Dehydration, Adult Dehydration is a condition in which you do not have enough fluid or water in your body. It happens when you take in less fluid than you lose. Vital organs such as the kidneys, brain, and heart cannot function without a proper amount of fluids. Any loss of fluids from the body can cause dehydration.  Dehydration can range from mild to severe. This condition should be treated right away to help prevent it from becoming severe. CAUSES  This condition may be caused by:  Vomiting.  Diarrhea.  Excessive sweating, such as when exercising in hot or humid weather.  Not drinking enough fluid during strenuous exercise or during an illness.  Excessive urine output.  Fever.  Certain medicines. RISK FACTORS This condition is more likely to develop in:  People who are taking certain medicines that cause the body to lose excess fluid (diuretics).   People who have a chronic illness, such as diabetes, that may increase urination.  Older adults.   People who live at high altitudes.   People who participate in endurance sports.  SYMPTOMS  Mild Dehydration  Thirst.  Dry lips.  Slightly dry mouth.  Dry, warm skin. Moderate Dehydration  Very dry mouth.   Muscle cramps.   Dark urine and decreased urine production.   Decreased tear production.   Headache.   Light-headedness, especially when you stand up from a sitting position.  Severe Dehydration  Changes in skin.   Cold and clammy skin.   Skin does not spring back quickly when lightly pinched and released.   Changes in body fluids.   Extreme thirst.   No tears.   Not able to sweat when body temperature is high, such as in hot weather.   Minimal urine production.   Changes in vital signs.   Rapid, weak pulse (more than 100 beats per minute when you are sitting still).   Rapid breathing.   Low blood pressure.   Other changes.   Sunken eyes.   Cold hands and feet.    Confusion.  Lethargy and difficulty being awakened.  Fainting (syncope).   Short-term weight loss.   Unconsciousness. DIAGNOSIS  This condition may be diagnosed based on your symptoms. You may also have tests to determine how severe your dehydration is. These tests may include:   Urine tests.   Blood tests.  TREATMENT  Treatment for this condition depends on the severity. Mild or moderate dehydration can often be treated at home. Treatment should be started right away. Do not wait until dehydration becomes severe. Severe dehydration needs to be treated at the hospital. Treatment for Mild Dehydration  Drinking plenty of water to replace the fluid you have lost.   Replacing minerals in your blood (electrolytes) that you may have lost.  Treatment for Moderate Dehydration  Consuming oral rehydration solution (ORS). Treatment for Severe Dehydration  Receiving fluid through an IV tube.   Receiving electrolyte solution through a feeding tube that is passed through your nose and into your stomach (nasogastric tube or NG tube).  Correcting any abnormalities in electrolytes. HOME CARE INSTRUCTIONS   Drink enough fluid to keep your urine clear or pale yellow.   Drink water or fluid slowly by taking small sips. You can also try sucking on ice cubes.  Have food or beverages that contain electrolytes. Examples include bananas and sports drinks.  Take over-the-counter and prescription medicines only as told by your health care provider.   Prepare ORS according to the manufacturer's instructions. Take sips   of ORS every 5 minutes until your urine returns to normal.  If you have vomiting or diarrhea, continue to try to drink water, ORS, or both.   If you have diarrhea, avoid:   Beverages that contain caffeine.   Fruit juice.   Milk.   Carbonated soft drinks.  Do not take salt tablets. This can lead to the condition of having too much sodium in your body  (hypernatremia).  SEEK MEDICAL CARE IF:  You cannot eat or drink without vomiting.  You have had moderate diarrhea during a period of more than 24 hours.  You have a fever. SEEK IMMEDIATE MEDICAL CARE IF:   You have extreme thirst.  You have severe diarrhea.  You have not urinated in 6-8 hours, or you have urinated only a small amount of very dark urine.  You have shriveled skin.  You are dizzy, confused, or both.   This information is not intended to replace advice given to you by your health care provider. Make sure you discuss any questions you have with your health care provider.   Document Released: 11/28/2005 Document Revised: 08/19/2015 Document Reviewed: 04/15/2015 Elsevier Interactive Patient Education 2016 Elsevier Inc.    Nausea and Vomiting Nausea is a sick feeling that often comes before throwing up (vomiting). Vomiting is a reflex where stomach contents come out of your mouth. Vomiting can cause severe loss of body fluids (dehydration). Children and elderly adults can become dehydrated quickly, especially if they also have diarrhea. Nausea and vomiting are symptoms of a condition or disease. It is important to find the cause of your symptoms. CAUSES   Direct irritation of the stomach lining. This irritation can result from increased acid production (gastroesophageal reflux disease), infection, food poisoning, taking certain medicines (such as nonsteroidal anti-inflammatory drugs), alcohol use, or tobacco use.  Signals from the brain.These signals could be caused by a headache, heat exposure, an inner ear disturbance, increased pressure in the brain from injury, infection, a tumor, or a concussion, pain, emotional stimulus, or metabolic problems.  An obstruction in the gastrointestinal tract (bowel obstruction).  Illnesses such as diabetes, hepatitis, gallbladder problems, appendicitis, kidney problems, cancer, sepsis, atypical symptoms of a heart attack, or  eating disorders.  Medical treatments such as chemotherapy and radiation.  Receiving medicine that makes you sleep (general anesthetic) during surgery. DIAGNOSIS Your caregiver may ask for tests to be done if the problems do not improve after a few days. Tests may also be done if symptoms are severe or if the reason for the nausea and vomiting is not clear. Tests may include:  Urine tests.  Blood tests.  Stool tests.  Cultures (to look for evidence of infection).  X-rays or other imaging studies. Test results can help your caregiver make decisions about treatment or the need for additional tests. TREATMENT You need to stay well hydrated. Drink frequently but in small amounts.You may wish to drink water, sports drinks, clear broth, or eat frozen ice pops or gelatin dessert to help stay hydrated.When you eat, eating slowly may help prevent nausea.There are also some antinausea medicines that may help prevent nausea. HOME CARE INSTRUCTIONS   Take all medicine as directed by your caregiver.  If you do not have an appetite, do not force yourself to eat. However, you must continue to drink fluids.  If you have an appetite, eat a normal diet unless your caregiver tells you differently.  Eat a variety of complex carbohydrates (rice, wheat, potatoes, bread), lean meats, yogurt, fruits,   and vegetables.  Avoid high-fat foods because they are more difficult to digest.  Drink enough water and fluids to keep your urine clear or pale yellow.  If you are dehydrated, ask your caregiver for specific rehydration instructions. Signs of dehydration may include:  Severe thirst.  Dry lips and mouth.  Dizziness.  Dark urine.  Decreasing urine frequency and amount.  Confusion.  Rapid breathing or pulse. SEEK IMMEDIATE MEDICAL CARE IF:   You have blood or brown flecks (like coffee grounds) in your vomit.  You have black or bloody stools.  You have a severe headache or stiff  neck.  You are confused.  You have severe abdominal pain.  You have chest pain or trouble breathing.  You do not urinate at least once every 8 hours.  You develop cold or clammy skin.  You continue to vomit for longer than 24 to 48 hours.  You have a fever. MAKE SURE YOU:   Understand these instructions.  Will watch your condition.  Will get help right away if you are not doing well or get worse.   This information is not intended to replace advice given to you by your health care provider. Make sure you discuss any questions you have with your health care provider.   Document Released: 11/28/2005 Document Revised: 02/20/2012 Document Reviewed: 04/27/2011 Elsevier Interactive Patient Education 2016 Elsevier Inc.  

## 2016-08-05 ENCOUNTER — Telehealth: Payer: Self-pay | Admitting: *Deleted

## 2016-08-05 NOTE — Telephone Encounter (Signed)
  Oncology Nurse Navigator Documentation  Called Mr. Sisk in follow-up to call received from ENT Dr. Noreene Filbert office. Mr. Bladen indicated he is in need of a recliner for sleeping as he is having difficulty managing secretions in a more prone position.  He also noted his next appt with Dr. Shirlee Limerick will be in Nov/Dec, after which he will need another referral from Dr. Alvy Bimler for additional visits.  I indicated these items can be addressed when he sees Dr. Alvy Bimler on Monday.  He voiced agreement, expressed appreciation for my call.  Gayleen Orem, RN, BSN, [place] at [facility] [phone]   Navigator Location: [place] (08/05/16 1531) Navigator Encounter Type: Telephone (08/05/16 1531) Telephone: Outgoing Call (08/05/16 1531)           Treatment Phase: Treatment (08/05/16 1531)     Interventions: Coordination of Care (08/05/16 1531)                      Time Spent with Patient: 15 (08/05/16 1531)

## 2016-08-08 ENCOUNTER — Other Ambulatory Visit (HOSPITAL_BASED_OUTPATIENT_CLINIC_OR_DEPARTMENT_OTHER): Payer: Medicaid Other

## 2016-08-08 ENCOUNTER — Ambulatory Visit (HOSPITAL_BASED_OUTPATIENT_CLINIC_OR_DEPARTMENT_OTHER): Payer: Medicaid Other | Admitting: Hematology and Oncology

## 2016-08-08 ENCOUNTER — Other Ambulatory Visit: Payer: Self-pay | Admitting: Hematology and Oncology

## 2016-08-08 ENCOUNTER — Ambulatory Visit (HOSPITAL_BASED_OUTPATIENT_CLINIC_OR_DEPARTMENT_OTHER): Payer: Medicaid Other

## 2016-08-08 ENCOUNTER — Ambulatory Visit: Payer: Medicaid Other

## 2016-08-08 ENCOUNTER — Encounter: Payer: Self-pay | Admitting: *Deleted

## 2016-08-08 VITALS — BP 128/61 | HR 83 | Temp 98.6°F | Resp 18 | Wt 161.1 lb

## 2016-08-08 DIAGNOSIS — E43 Unspecified severe protein-calorie malnutrition: Secondary | ICD-10-CM | POA: Diagnosis not present

## 2016-08-08 DIAGNOSIS — R5383 Other fatigue: Secondary | ICD-10-CM

## 2016-08-08 DIAGNOSIS — E038 Other specified hypothyroidism: Secondary | ICD-10-CM

## 2016-08-08 DIAGNOSIS — L989 Disorder of the skin and subcutaneous tissue, unspecified: Secondary | ICD-10-CM

## 2016-08-08 DIAGNOSIS — F1010 Alcohol abuse, uncomplicated: Secondary | ICD-10-CM | POA: Diagnosis not present

## 2016-08-08 DIAGNOSIS — Z430 Encounter for attention to tracheostomy: Secondary | ICD-10-CM

## 2016-08-08 DIAGNOSIS — C321 Malignant neoplasm of supraglottis: Secondary | ICD-10-CM

## 2016-08-08 DIAGNOSIS — Z931 Gastrostomy status: Secondary | ICD-10-CM

## 2016-08-08 DIAGNOSIS — Z930 Tracheostomy status: Secondary | ICD-10-CM

## 2016-08-08 DIAGNOSIS — G893 Neoplasm related pain (acute) (chronic): Secondary | ICD-10-CM

## 2016-08-08 DIAGNOSIS — E039 Hypothyroidism, unspecified: Secondary | ICD-10-CM

## 2016-08-08 DIAGNOSIS — Z5112 Encounter for antineoplastic immunotherapy: Secondary | ICD-10-CM

## 2016-08-08 LAB — COMPREHENSIVE METABOLIC PANEL
ALBUMIN: 3.7 g/dL (ref 3.5–5.0)
ALK PHOS: 47 U/L (ref 40–150)
ALT: 12 U/L (ref 0–55)
ANION GAP: 10 meq/L (ref 3–11)
AST: 13 U/L (ref 5–34)
BILIRUBIN TOTAL: 0.44 mg/dL (ref 0.20–1.20)
BUN: 14.1 mg/dL (ref 7.0–26.0)
CALCIUM: 9.8 mg/dL (ref 8.4–10.4)
CO2: 29 mEq/L (ref 22–29)
Chloride: 102 mEq/L (ref 98–109)
Creatinine: 0.8 mg/dL (ref 0.7–1.3)
GLUCOSE: 127 mg/dL (ref 70–140)
Potassium: 3.5 mEq/L (ref 3.5–5.1)
Sodium: 141 mEq/L (ref 136–145)
TOTAL PROTEIN: 7.2 g/dL (ref 6.4–8.3)

## 2016-08-08 LAB — CBC WITH DIFFERENTIAL/PLATELET
BASO%: 0.2 % (ref 0.0–2.0)
BASOS ABS: 0 10*3/uL (ref 0.0–0.1)
EOS ABS: 0.1 10*3/uL (ref 0.0–0.5)
EOS%: 1.2 % (ref 0.0–7.0)
HEMATOCRIT: 38.4 % (ref 38.4–49.9)
HEMOGLOBIN: 12.8 g/dL — AB (ref 13.0–17.1)
LYMPH%: 5 % — ABNORMAL LOW (ref 14.0–49.0)
MCH: 34.1 pg — AB (ref 27.2–33.4)
MCHC: 33.3 g/dL (ref 32.0–36.0)
MCV: 102.4 fL — AB (ref 79.3–98.0)
MONO#: 1.1 10*3/uL — AB (ref 0.1–0.9)
MONO%: 11.4 % (ref 0.0–14.0)
NEUT%: 82.2 % — ABNORMAL HIGH (ref 39.0–75.0)
NEUTROS ABS: 7.8 10*3/uL — AB (ref 1.5–6.5)
PLATELETS: 179 10*3/uL (ref 140–400)
RBC: 3.75 10*6/uL — ABNORMAL LOW (ref 4.20–5.82)
RDW: 14.9 % — AB (ref 11.0–14.6)
WBC: 9.5 10*3/uL (ref 4.0–10.3)
lymph#: 0.5 10*3/uL — ABNORMAL LOW (ref 0.9–3.3)

## 2016-08-08 LAB — TSH: TSH: 4.941 m[IU]/L — AB (ref 0.320–4.118)

## 2016-08-08 MED ORDER — SODIUM CHLORIDE 0.9% FLUSH
10.0000 mL | INTRAVENOUS | Status: DC | PRN
  Administered 2016-08-08: 10 mL
  Filled 2016-08-08: qty 10

## 2016-08-08 MED ORDER — LEVOTHYROXINE SODIUM 50 MCG PO TABS: 50.0000 ug | ORAL_TABLET | Freq: Every day | ORAL | 9 refills | Status: DC

## 2016-08-08 MED ORDER — SODIUM CHLORIDE 0.9 % IV SOLN
Freq: Once | INTRAVENOUS | Status: AC
  Administered 2016-08-08: 14:00:00 via INTRAVENOUS

## 2016-08-08 MED ORDER — HYDROMORPHONE HCL 4 MG/ML IJ SOLN
INTRAMUSCULAR | Status: AC
  Filled 2016-08-08: qty 1

## 2016-08-08 MED ORDER — SODIUM CHLORIDE 0.9 % IV SOLN
200.0000 mg | Freq: Once | INTRAVENOUS | Status: AC
  Administered 2016-08-08: 200 mg via INTRAVENOUS
  Filled 2016-08-08: qty 8

## 2016-08-08 MED ORDER — SODIUM CHLORIDE 0.9 % IJ SOLN
10.0000 mL | INTRAMUSCULAR | Status: DC | PRN
  Administered 2016-08-08: 10 mL
  Filled 2016-08-08: qty 10

## 2016-08-08 MED ORDER — MORPHINE SULFATE 30 MG PO TABS: 30.0000 mg | ORAL_TABLET | ORAL | 0 refills | Status: DC | PRN

## 2016-08-08 MED ORDER — HEPARIN SOD (PORK) LOCK FLUSH 100 UNIT/ML IV SOLN
500.0000 [IU] | Freq: Once | INTRAVENOUS | Status: AC | PRN
  Administered 2016-08-08: 500 [IU]
  Filled 2016-08-08: qty 5

## 2016-08-08 MED ORDER — HYDROMORPHONE HCL 4 MG/ML IJ SOLN
2.0000 mg | INTRAMUSCULAR | Status: DC | PRN
Start: 2016-08-08 — End: 2016-08-08
  Administered 2016-08-08: 2 mg via INTRAVENOUS

## 2016-08-08 MED ORDER — PREDNISONE 10 MG PO TABS: 10.0000 mg | ORAL_TABLET | Freq: Every day | ORAL | 9 refills | Status: DC

## 2016-08-08 MED FILL — LEVOTHYROXINE 50 MCG TABLET: 50 | 30 days supply | Qty: 30 | Fill #0

## 2016-08-08 MED FILL — MORPHINE SULFATE IR 30 MG T: 30 | 15 days supply | Qty: 90 | Fill #0

## 2016-08-08 MED FILL — predniSONE 10 MG TABS: 10 | 30 days supply | Qty: 30 | Fill #0

## 2016-08-08 NOTE — Progress Notes (Signed)
  Oncology Nurse Navigator Documentation  Met with Kyle Alexander during follow-up appt with Dr. Alvy Bimler.  He reported instilling 6-8 cans supplement and 120 cc of water or more with each feeding.    He voiced understanding that CT Neck will be scheduled for the first/second week of September to check on treatment efficacy and new onset of R clavicular nodule.  He received Rx for recliner. He understands I can be contacted with needs/concerns.  Gayleen Orem, RN, BSN, [place] at [facility] [phone]   Navigator Location: [facility]-Med Onc (08/08/16 1300) Navigator Encounter Type: Clinic/MDC (08/08/16 1300)           Patient Visit Type: MedOnc;Follow-up (08/08/16 1300) Treatment Phase: Treatment (08/08/16 1300)                  Acuity: Level 2 (08/08/16 1300)   Acuity Level 2: Ongoing guidance and education throughout treatment as needed (08/08/16 1300)     Time Spent with Patient: 30 (08/08/16 1300)

## 2016-08-08 NOTE — Patient Instructions (Signed)
[facility] Discharge Instructions for Patients Receiving Chemotherapy  Today you received the following chemotherapy agents: Keytruda   To help prevent nausea and vomiting after your treatment, we encourage you to take your nausea medication as directed    If you develop nausea and vomiting that is not controlled by your nausea medication, call the clinic.   BELOW ARE SYMPTOMS THAT SHOULD BE REPORTED IMMEDIATELY:  *FEVER GREATER THAN 100.5 F  *CHILLS WITH OR WITHOUT FEVER  NAUSEA AND VOMITING THAT IS NOT CONTROLLED WITH YOUR NAUSEA MEDICATION  *UNUSUAL SHORTNESS OF BREATH  *UNUSUAL BRUISING OR BLEEDING  TENDERNESS IN MOUTH AND THROAT WITH OR WITHOUT PRESENCE OF ULCERS  *URINARY PROBLEMS  *BOWEL PROBLEMS  UNUSUAL RASH Items with * indicate a potential emergency and should be followed up as soon as possible.  Feel free to call the clinic you have any questions or concerns. The clinic phone number is [phone].  Please show the CHEMO ALERT CARD at check-in to the Emergency Department and triage nurse.   

## 2016-08-09 ENCOUNTER — Other Ambulatory Visit: Payer: Self-pay | Admitting: Hematology and Oncology

## 2016-08-09 ENCOUNTER — Encounter: Payer: Self-pay | Admitting: Hematology and Oncology

## 2016-08-09 ENCOUNTER — Ambulatory Visit (HOSPITAL_COMMUNITY)
Admission: RE | Admit: 2016-08-09 | Discharge: 2016-08-09 | Disposition: A | Discharge status: Home / Self Care | Payer: Medicaid Other | Source: Ambulatory Visit | Attending: Hematology and Oncology | Admitting: Hematology and Oncology

## 2016-08-09 DIAGNOSIS — K9423 Gastrostomy malfunction: Secondary | ICD-10-CM | POA: Diagnosis not present

## 2016-08-09 DIAGNOSIS — Y733 Surgical instruments, materials and gastroenterology and urology devices (including sutures) associated with adverse incidents: Secondary | ICD-10-CM | POA: Diagnosis not present

## 2016-08-09 DIAGNOSIS — Z431 Encounter for attention to gastrostomy: Secondary | ICD-10-CM | POA: Diagnosis present

## 2016-08-09 DIAGNOSIS — C321 Malignant neoplasm of supraglottis: Secondary | ICD-10-CM

## 2016-08-09 DIAGNOSIS — E039 Hypothyroidism, unspecified: Secondary | ICD-10-CM | POA: Insufficient documentation

## 2016-08-09 HISTORY — PX: IR GENERIC HISTORICAL: IMG1180011

## 2016-08-09 NOTE — Assessment & Plan Note (Signed)
The patient continues to drink alcohol. Again I discussed the importance of remaining abstinent while on treatment

## 2016-08-09 NOTE — Progress Notes (Signed)
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[place])   03/15/2015 - 03/17/2015 Hospital Admission    He was admitted to the hospital for evaluation of dysphagia, SOB, hemoptosis, hoarseness, 30-40 pound weight loss and worsening bilateral neck masses for 5 months      03/15/2015 Imaging    Ct showed extensive circumferential malignancy in the hypopharyngeal/supraglottic region with regional LN metastases      03/16/2015 Procedure    He underwent ULTRASOUND-GUIDED BIOPSY OF LEFT CERVICAL LYMPH NODES      03/16/2015 Pathology Results    Accession: [id number] LN biopsy showed invasive squamous cell cancer      03/16/2015 Pathology Results    Accession: [id number] showed atypical squamous cells      03/25/2015 - 04/07/2015 Hospital Admission    He was admitted to the hospital and underwent tracheostomy placement, feeeding tube placement but subsequently left [facility]      03/26/2015 Surgery    He had multiple extraction of tooth numbers 1, 2, 5, 6, 7, 8, 9, 10, 11, 12, 13, 18, 19, 21, 22, 23, 24, 25, 26, 27, 28, and 29. and 4 Quadrants of alveoloplasty      03/26/2015 Surgery    He underwent tracheostomy      03/31/2015 Surgery    He had open gastrostomy tube placement by Dr. Donne Hazel      04/16/2015 - 05/18/2015 Radiation Therapy    Laryngopharynx and bilateral neck / 50 Gy in 20 fractions to gross disease, 45 Gy in 20 fractions to high risk nodal echelons  Beams/energy: Helical IMRT / 6 MV photons      04/16/2015 Procedure    Fluoroscopic reposition of the 18 French gastrostomy confirmed back in the stomach,      07/03/2015 Procedure    IR performed  replacement of gastrostomy tube with a new 70 French balloon retention tube      10/01/2015 Imaging    PEt scan showed persistent hypermetabolism within the primary supraglottic laryngeal tumor and within right retropharyngeal, bilateral level II and left level IV cervical nodal metastases      10/28/2015 Procedure    He had placement of PICC line. The IR was not able to place PORT due to suspected upper respiratory infection      11/13/2015 - 03/21/2016 Chemotherapy    He received 5FU, carboplatin chemo with weekly Erbitux      11/19/2015 Surgery    Gastrostomy tube replaced.      11/30/2015 Procedure    Placement of right jugular port-a-cath.      11/30/2015 Procedure    PICC removed.      12/30/2015 Imaging    PET CT showed positive response to Rx      02/26/2016 Procedure    He underwent direct laryngoscopy with biopsy. Esophageal dilatation.       02/26/2016 Pathology Results    Repeat biopsy of supraglottis showed persistent disease      02/29/2016 Procedure    Gastrostomy tube exchanged.      03/28/2016 PET scan    Hypermetabolic tissue in the posterior RIGHT hypopharynx is similar in pattern to PET-CT of 12/30/2015 but increased in metabolic activity.2. Hypermetabolic tissue /  lymph nodes in the LEFT supraclavicular nodal station are in a similar pattern       04/04/2016 -  Chemotherapy    He started on palliative chemotherapy with pembrolizumab      05/03/2016 Imaging    MRI head is negative      06/02/2016 Imaging    Mild improvement of diffuse pharyngeal and supraglottic edema.Increased asymmetry of right-sided hypopharyngeal/supraglottic softtissue compared to the prior neck CT with FDG uptake in this region on prior PET-CT. Residual/recurrent tumor is possible       INTERVAL HISTORY: Please see below for problem oriented charting He was seen prior to his chemotherapy. He is gaining weight and is eating well with prednisone. His pain appears to be  well-controlled with current prescription pain medicine. Denies nausea or constipation. He denies recent smoking but has been drinking. He complained of a new palpable lump near his right scapula which is new  REVIEW OF SYSTEMS:   Constitutional: Denies fevers, chills or abnormal weight loss Eyes: Denies blurriness of vision Ears, nose, mouth, throat, and face: Denies mucositis or sore throat Respiratory: Denies cough, dyspnea or wheezes Cardiovascular: Denies palpitation, chest discomfort or lower extremity swelling Gastrointestinal:  Denies nausea, heartburn or change in bowel habits Skin: Denies abnormal skin rashes Lymphatics: Denies new lymphadenopathy or easy bruising Neurological:Denies numbness, tingling or new weaknesses Behavioral/Psych: Mood is stable, no new changes  All other systems were reviewed with the patient and are negative.  I have reviewed the past medical history, past surgical history, social history and family history with the patient and they are unchanged from previous note.  ALLERGIES:  is allergic to aspirin.  MEDICATIONS:  Current Outpatient Prescriptions  Medication Sig Dispense Refill  . fluticasone (FLONASE) 50 MCG/ACT nasal spray Place 2 sprays into both nostrils daily. 16 g 2  . guaiFENesin (ROBITUSSIN) 100 MG/5ML SOLN Take 5 mLs (100 mg total) by mouth every 4 (four) hours as needed for cough or to loosen phlegm. 473 mL 3  . lansoprazole (PREVACID) 30 MG capsule Take 1 capsule (30 mg total) by mouth 2 (two) times daily before a meal. 60 capsule 6  . lidocaine (XYLOCAINE) 2 % solution Use as directed 15 mLs in the mouth or throat every 8 (eight) hours as needed for mouth pain. 100 mL 0  . LORazepam (ATIVAN) 1 MG tablet Take 1 tablet (1 mg total) by mouth 2 (two) times daily as needed for anxiety (or nausea). 30 tablet 0  . metoCLOPramide (REGLAN) 5 MG/5ML solution Place 5 mLs (5 mg total) into feeding tube 4 (four) times daily -  before meals and at  bedtime. 240 mL 1  . morphine (MS CONTIN) 30 MG 12 hr tablet Take 1 tablet (30 mg total) by mouth every 12 (twelve) hours. 60 tablet 0  . morphine (MSIR) 30 MG tablet Take 1 tablet (30 mg total) by mouth every 4 (four) hours as needed for severe pain. 90 tablet 0  . Nutritional Supplements (FEEDING SUPPLEMENT, JEVITY 1.5 CAL/FIBER,) LIQD Give 1 can Osmolite 1.2 + 1 can of Jevity 1.5 QID via PEG with 60 cc free water before and after bolus. Flush with 240 cc free water BID between feedings. 948 mL   . omeprazole (PRILOSEC) 20 MG capsule Take 1 capsule (20 mg total) by mouth 2 (two) times daily. 60 capsule 0  . ondansetron (ZOFRAN) 8 MG tablet Take 1 tablet (8 mg total) by mouth every 8 (eight) hours as needed for nausea. 60 tablet  3  . polyethylene glycol (MIRALAX) packet Take 17 g by mouth daily. 14 each 0  . predniSONE (DELTASONE) 10 MG tablet Take 1 tablet (10 mg total) by mouth daily with breakfast. 30 tablet 9  . SODIUM CHLORIDE, EXTERNAL, 0.9 % SOLN Use to clean around Trach and perform Trach care once daily and PRN 1000 mL 11  . zolpidem (AMBIEN) 5 MG tablet Take 1 tablet (5 mg total) by mouth at bedtime as needed for sleep. 30 tablet 0  . levothyroxine (SYNTHROID, LEVOTHROID) 50 MCG tablet Take 1 tablet (50 mcg total) by mouth daily before breakfast. 30 tablet 9   No current facility-administered medications for this visit.     PHYSICAL EXAMINATION: ECOG PERFORMANCE STATUS: 1 - Symptomatic but completely ambulatory  Vitals:   08/08/16 1238  BP: 128/61  Pulse: 83  Resp: 18  Temp: 98.6 F (37 C)   Filed Weights   08/08/16 1238  Weight: 161 lb 1.6 oz (73.1 kg)    GENERAL:alert, no distress and comfortable SKIN: skin color, texture, turgor are normal, no rashes or significant lesions EYES: normal, Conjunctiva are pink and non-injected, sclera clear OROPHARYNX:no exudate, no erythema and lips, buccal mucosa, and tongue normal  NECK: Tracheostomy in situ LYMPH:  no palpable  lymphadenopathy in the cervical, axillary or inguinal LUNGS: clear to auscultation and percussion with normal breathing effort HEART: regular rate & rhythm and no murmurs and no lower extremity edema ABDOMEN:abdomen soft, non-tender and normal bowel sounds. Noted dysfunctional feeding tube with a broken cap Musculoskeletal:no cyanosis of digits and no clubbing  NEURO: alert & oriented x 3 with fluent speech, no focal motor/sensory deficits  LABORATORY DATA:  I have reviewed the data as listed    Component Value Date/Time   NA 141 08/08/2016 1214   K 3.5 08/08/2016 1214   CL 99 (L) 01/02/2016 1433   CO2 29 08/08/2016 1214   GLUCOSE 127 08/08/2016 1214   BUN 14.1 08/08/2016 1214   CREATININE 0.8 08/08/2016 1214   CALCIUM 9.8 08/08/2016 1214   PROT 7.2 08/08/2016 1214   ALBUMIN 3.7 08/08/2016 1214   AST 13 08/08/2016 1214   ALT 12 08/08/2016 1214   ALKPHOS 47 08/08/2016 1214   BILITOT 0.44 08/08/2016 1214   GFRNONAA >60 01/02/2016 1422   GFRAA >60 01/02/2016 1422    No results found for: SPEP, UPEP  Lab Results  Component Value Date   WBC 9.5 08/08/2016   NEUTROABS 7.8 (H) 08/08/2016   HGB 12.8 (L) 08/08/2016   HCT 38.4 08/08/2016   MCV 102.4 (H) 08/08/2016   PLT 179 08/08/2016      Chemistry      Component Value Date/Time   NA 141 08/08/2016 1214   K 3.5 08/08/2016 1214   CL 99 (L) 01/02/2016 1433   CO2 29 08/08/2016 1214   BUN 14.1 08/08/2016 1214   CREATININE 0.8 08/08/2016 1214      Component Value Date/Time   CALCIUM 9.8 08/08/2016 1214   ALKPHOS 47 08/08/2016 1214   AST 13 08/08/2016 1214   ALT 12 08/08/2016 1214   BILITOT 0.44 08/08/2016 1214      ASSESSMENT & PLAN:  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[facility]) He tolerated treatment well Pain control has improved. I am concerned about new palpable lump at the base of his right neck. It is certainly worrisome for cancer. I will order repeat imaging study with CT scan of the neck and the chest  before I see him back  next month  Alcohol abuse The patient continues to drink alcohol. Again I discussed the importance of remaining abstinent while on treatment  Tracheostomy care [facility]) I recommend close follow-up with ENT. It does not look infected   Gastrostomy tube in place [facility]) The feeding tube cap does not appear to be functioning well. I will consult IR to see if replacement is appropriate  Acquired hypothyroidism The patient has acquire hypothyroidism likely related to prior exposure to radiation treatment. He is gaining weight. I recommend increasing the dose of his thyroid replacement therapy  Cancer associated pain This is overall improved. He can continue to take IR morphine as needed for pain I refilled his prescriptions  Protein-calorie malnutrition, severe ([place]) He has cancer cachexia. He is doing better and is gaining weight on prednisone We will get dietitian to follow closely. Continue to encourage him to take nutritional supplements on the regular basis   Orders Placed This Encounter  Procedures  . IR Radiologist Eval & Mgmt    Standing Status:   Future    Standing Expiration Date:   10/08/2017    Order Specific Question:   Reason for Exam (SYMPTOM  OR DIAGNOSIS REQUIRED)    Answer:   PEG tube tip broke off and pt requests Tube exchanged.    Order Specific Question:   Preferred Imaging Location?    Answer:   [facility]  . CT CHEST W CONTRAST    Standing Status:   Future    Standing Expiration Date:   09/12/2017    Order Specific Question:   Reason for exam:    Answer:   supraglottic cancer, upper scapula mass, exclude disease progression    Order Specific Question:   Preferred imaging location?    Answer:   [facility]  . CT Soft Tissue Neck W Contrast    Standing Status:   Future    Standing Expiration Date:   08/08/2017    Order Specific Question:   If indicated for the ordered procedure, I authorize the administration of  contrast media per Radiology protocol    Answer:   Yes    Order Specific Question:   Reason for Exam (SYMPTOM  OR DIAGNOSIS REQUIRED)    Answer:   supraglottic cancer, upper scapula mass, exclude disease progression    Order Specific Question:   Preferred imaging location?    Answer:   [facility]   All questions were answered. The patient knows to call the clinic with any problems, questions or concerns. No barriers to learning was detected. I spent 25 minutes counseling the patient face to face. The total time spent in the appointment was 40 minutes and more than 50% was on counseling and review of test results     [facility], [place], MD 08/09/2016 7:33 AM

## 2016-08-09 NOTE — Assessment & Plan Note (Signed)
I recommend close follow-up with ENT. It does not look infected

## 2016-08-09 NOTE — Assessment & Plan Note (Signed)
He tolerated treatment well Pain control has improved. I am concerned about new palpable lump at the base of his right neck. It is certainly worrisome for cancer. I will order repeat imaging study with CT scan of the neck and the chest before I see him back next month

## 2016-08-09 NOTE — Assessment & Plan Note (Signed)
The patient has acquire hypothyroidism likely related to prior exposure to radiation treatment. He is gaining weight. I recommend increasing the dose of his thyroid replacement therapy

## 2016-08-09 NOTE — Assessment & Plan Note (Signed)
This is overall improved. He can continue to take IR morphine as needed for pain I refilled his prescriptions

## 2016-08-09 NOTE — Assessment & Plan Note (Signed)
The feeding tube cap does not appear to be functioning well. I will consult IR to see if replacement is appropriate

## 2016-08-09 NOTE — Assessment & Plan Note (Signed)
He has cancer cachexia. He is doing better and is gaining weight on prednisone We will get dietitian to follow closely. Continue to encourage him to take nutritional supplements on the regular basis

## 2016-08-23 ENCOUNTER — Telehealth: Payer: Self-pay | Admitting: Hematology and Oncology

## 2016-08-23 NOTE — Telephone Encounter (Signed)
Appts conf with patient per 08/08/16 los.

## 2016-08-24 ENCOUNTER — Telehealth: Payer: Self-pay | Admitting: *Deleted

## 2016-08-25 ENCOUNTER — Ambulatory Visit (HOSPITAL_COMMUNITY): Payer: Medicaid Other

## 2016-08-25 NOTE — Telephone Encounter (Signed)
Oncology Nurse Navigator Documentation  Returned Kyle Alexander call.  He indicated he needs financial assistance with rent.  I arranged for him to meet with [name] following tomorrow's CT; she is calling him today as well today; [name] will contact [place] for further assistance as needed.  He understands to to come to Radiation Waiting to meet with [facility].  Gayleen [place]rem, RN, BSN, [place] at [place] [phone]

## 2016-08-26 ENCOUNTER — Ambulatory Visit (HOSPITAL_COMMUNITY)
Admission: RE | Admit: 2016-08-26 | Discharge: 2016-08-26 | Disposition: A | Discharge status: Home / Self Care | Payer: Medicaid Other | Source: Ambulatory Visit | Attending: Hematology and Oncology | Admitting: Hematology and Oncology

## 2016-08-26 ENCOUNTER — Encounter (HOSPITAL_COMMUNITY): Payer: Self-pay

## 2016-08-26 DIAGNOSIS — Z930 Tracheostomy status: Secondary | ICD-10-CM | POA: Insufficient documentation

## 2016-08-26 DIAGNOSIS — C321 Malignant neoplasm of supraglottis: Secondary | ICD-10-CM | POA: Insufficient documentation

## 2016-08-26 DIAGNOSIS — R590 Localized enlarged lymph nodes: Secondary | ICD-10-CM | POA: Diagnosis not present

## 2016-08-26 DIAGNOSIS — L989 Disorder of the skin and subcutaneous tissue, unspecified: Secondary | ICD-10-CM | POA: Insufficient documentation

## 2016-08-26 DIAGNOSIS — E079 Disorder of thyroid, unspecified: Secondary | ICD-10-CM | POA: Insufficient documentation

## 2016-08-26 MED ORDER — IOPAMIDOL (ISOVUE-300) INJECTION 61%
75.0000 mL | Freq: Once | INTRAVENOUS | Status: AC | PRN
  Administered 2016-08-26: 75 mL via INTRAVENOUS

## 2016-08-29 ENCOUNTER — Other Ambulatory Visit (HOSPITAL_BASED_OUTPATIENT_CLINIC_OR_DEPARTMENT_OTHER): Payer: Medicaid Other

## 2016-08-29 ENCOUNTER — Ambulatory Visit (HOSPITAL_BASED_OUTPATIENT_CLINIC_OR_DEPARTMENT_OTHER): Payer: Medicaid Other

## 2016-08-29 ENCOUNTER — Encounter: Payer: Self-pay | Admitting: Hematology and Oncology

## 2016-08-29 ENCOUNTER — Telehealth: Payer: Self-pay | Admitting: *Deleted

## 2016-08-29 ENCOUNTER — Telehealth: Payer: Self-pay | Admitting: Hematology and Oncology

## 2016-08-29 ENCOUNTER — Ambulatory Visit: Payer: Medicaid Other

## 2016-08-29 ENCOUNTER — Ambulatory Visit: Payer: Medicaid Other | Admitting: Hematology and Oncology

## 2016-08-29 VITALS — BP 116/76 | HR 80 | Temp 98.7°F | Resp 18 | Ht 70.0 in | Wt 165.5 lb

## 2016-08-29 DIAGNOSIS — E039 Hypothyroidism, unspecified: Secondary | ICD-10-CM

## 2016-08-29 DIAGNOSIS — Z931 Gastrostomy status: Secondary | ICD-10-CM

## 2016-08-29 DIAGNOSIS — C321 Malignant neoplasm of supraglottis: Secondary | ICD-10-CM

## 2016-08-29 DIAGNOSIS — Z720 Tobacco use: Secondary | ICD-10-CM

## 2016-08-29 DIAGNOSIS — G893 Neoplasm related pain (acute) (chronic): Secondary | ICD-10-CM | POA: Diagnosis not present

## 2016-08-29 DIAGNOSIS — F1010 Alcohol abuse, uncomplicated: Secondary | ICD-10-CM

## 2016-08-29 DIAGNOSIS — R5383 Other fatigue: Secondary | ICD-10-CM

## 2016-08-29 DIAGNOSIS — Z23 Encounter for immunization: Secondary | ICD-10-CM

## 2016-08-29 DIAGNOSIS — Z5111 Encounter for antineoplastic chemotherapy: Secondary | ICD-10-CM | POA: Diagnosis not present

## 2016-08-29 LAB — CBC WITH DIFFERENTIAL/PLATELET
BASO%: 0.2 % (ref 0.0–2.0)
Basophils Absolute: 0 10*3/uL (ref 0.0–0.1)
EOS%: 0.8 % (ref 0.0–7.0)
Eosinophils Absolute: 0.1 10*3/uL (ref 0.0–0.5)
HEMATOCRIT: 37.9 % — AB (ref 38.4–49.9)
HEMOGLOBIN: 12.8 g/dL — AB (ref 13.0–17.1)
LYMPH%: 3.4 % — AB (ref 14.0–49.0)
MCH: 34 pg — AB (ref 27.2–33.4)
MCHC: 33.8 g/dL (ref 32.0–36.0)
MCV: 100.5 fL — ABNORMAL HIGH (ref 79.3–98.0)
MONO#: 1.2 10*3/uL — ABNORMAL HIGH (ref 0.1–0.9)
MONO%: 9.3 % (ref 0.0–14.0)
NEUT%: 86.3 % — ABNORMAL HIGH (ref 39.0–75.0)
NEUTROS ABS: 11 10*3/uL — AB (ref 1.5–6.5)
PLATELETS: 163 10*3/uL (ref 140–400)
RBC: 3.77 10*6/uL — ABNORMAL LOW (ref 4.20–5.82)
RDW: 14.6 % (ref 11.0–14.6)
WBC: 12.7 10*3/uL — AB (ref 4.0–10.3)
lymph#: 0.4 10*3/uL — ABNORMAL LOW (ref 0.9–3.3)
nRBC: 0 % (ref 0–0)

## 2016-08-29 LAB — COMPREHENSIVE METABOLIC PANEL
ALBUMIN: 3.7 g/dL (ref 3.5–5.0)
ALK PHOS: 51 U/L (ref 40–150)
ALT: 14 U/L (ref 0–55)
ANION GAP: 10 meq/L (ref 3–11)
AST: 14 U/L (ref 5–34)
BUN: 14.2 mg/dL (ref 7.0–26.0)
CALCIUM: 9.5 mg/dL (ref 8.4–10.4)
CHLORIDE: 102 meq/L (ref 98–109)
CO2: 29 mEq/L (ref 22–29)
CREATININE: 0.7 mg/dL (ref 0.7–1.3)
EGFR: >90 mL/min/{1.73_m2} (ref >90)
Glucose: 115 mg/dl (ref 70–140)
Potassium: 3.9 mEq/L (ref 3.5–5.1)
Sodium: 140 mEq/L (ref 136–145)
Total Bilirubin: 0.37 mg/dL (ref 0.20–1.20)
Total Protein: 7.4 g/dL (ref 6.4–8.3)

## 2016-08-29 LAB — TSH: TSH: 4.384 m[IU]/L — AB (ref 0.320–4.118)

## 2016-08-29 MED ORDER — OXYCODONE-ACETAMINOPHEN 5-325 MG PO TABS: 2.0000 | ORAL_TABLET | Freq: Once | ORAL | 0 refills | Status: DC

## 2016-08-29 MED ORDER — HEPARIN SOD (PORK) LOCK FLUSH 100 UNIT/ML IV SOLN
500.0000 [IU] | Freq: Once | INTRAVENOUS | Status: AC | PRN
  Administered 2016-08-29: 500 [IU]
  Filled 2016-08-29: qty 5

## 2016-08-29 MED ORDER — OXYCODONE-ACETAMINOPHEN 5-325 MG PO TABS
ORAL_TABLET | ORAL | Status: AC
  Filled 2016-08-29: qty 2

## 2016-08-29 MED ORDER — SODIUM CHLORIDE 0.9 % IJ SOLN
10.0000 mL | INTRAMUSCULAR | Status: DC | PRN
  Administered 2016-08-29: 10 mL
  Filled 2016-08-29: qty 10

## 2016-08-29 MED ORDER — INFLUENZA VAC SPLIT QUAD 0.5 ML IM SUSY
0.5000 mL | PREFILLED_SYRINGE | Freq: Once | INTRAMUSCULAR | Status: AC
  Administered 2016-08-29: 0.5 mL via INTRAMUSCULAR
  Filled 2016-08-29: qty 0.5

## 2016-08-29 MED ORDER — SODIUM CHLORIDE 0.9 % IV SOLN
Freq: Once | INTRAVENOUS | Status: AC
  Administered 2016-08-29: 13:00:00 via INTRAVENOUS

## 2016-08-29 MED ORDER — SODIUM CHLORIDE 0.9 % IV SOLN
200.0000 mg | Freq: Once | INTRAVENOUS | Status: AC
  Administered 2016-08-29: 200 mg via INTRAVENOUS
  Filled 2016-08-29: qty 8

## 2016-08-29 MED ORDER — OXYCODONE-ACETAMINOPHEN 5-325 MG PO TABS
2.0000 | ORAL_TABLET | Freq: Once | ORAL | Status: DC
Start: 2016-08-29 — End: 2016-08-29

## 2016-08-29 MED ORDER — SODIUM CHLORIDE 0.9% FLUSH
10.0000 mL | INTRAVENOUS | Status: DC | PRN
  Administered 2016-08-29: 10 mL
  Filled 2016-08-29: qty 10

## 2016-08-29 NOTE — Assessment & Plan Note (Signed)
The feeding tube looks ok without signs of infection

## 2016-08-29 NOTE — Assessment & Plan Note (Signed)
I continue to encourage him to quit smoking.

## 2016-08-29 NOTE — Telephone Encounter (Signed)
Per LOS I have scheduled appts and notified the scheduler 

## 2016-08-29 NOTE — Telephone Encounter (Signed)
Message sent to chemo scheduler to be added. Avs report and appointment schedule given to patient, per 08/29/16 los.

## 2016-08-29 NOTE — Patient Instructions (Signed)
[facility] Discharge Instructions for Patients Receiving Chemotherapy  Today you received the following chemotherapy agents: Keytruda   To help prevent nausea and vomiting after your treatment, we encourage you to take your nausea medication as directed    If you develop nausea and vomiting that is not controlled by your nausea medication, call the clinic.   BELOW ARE SYMPTOMS THAT SHOULD BE REPORTED IMMEDIATELY:  *FEVER GREATER THAN 100.5 F  *CHILLS WITH OR WITHOUT FEVER  NAUSEA AND VOMITING THAT IS NOT CONTROLLED WITH YOUR NAUSEA MEDICATION  *UNUSUAL SHORTNESS OF BREATH  *UNUSUAL BRUISING OR BLEEDING  TENDERNESS IN MOUTH AND THROAT WITH OR WITHOUT PRESENCE OF ULCERS  *URINARY PROBLEMS  *BOWEL PROBLEMS  UNUSUAL RASH Items with * indicate a potential emergency and should be followed up as soon as possible.  Feel free to call the clinic you have any questions or concerns. The clinic phone number is [phone].  Please show the CHEMO ALERT CARD at check-in to the Emergency Department and triage nurse.   

## 2016-08-29 NOTE — Patient Instructions (Signed)

## 2016-08-29 NOTE — Progress Notes (Signed)
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[place])   03/15/2015 - 03/17/2015 Hospital Admission    He was admitted to the hospital for evaluation of dysphagia, SOB, hemoptosis, hoarseness, 30-40 pound weight loss and worsening bilateral neck masses for 5 months      03/15/2015 Imaging    Ct showed extensive circumferential malignancy in the hypopharyngeal/supraglottic region with regional LN metastases      03/16/2015 Procedure    He underwent ULTRASOUND-GUIDED BIOPSY OF LEFT CERVICAL LYMPH NODES      03/16/2015 Pathology Results    Accession: [id number] LN biopsy showed invasive squamous cell cancer      03/16/2015 Pathology Results    Accession: [id number] showed atypical squamous cells      03/25/2015 - 04/07/2015 Hospital Admission    He was admitted to the hospital and underwent tracheostomy placement, feeeding tube placement but subsequently left [facility]      03/26/2015 Surgery    He had multiple extraction of tooth numbers 1, 2, 5, 6, 7, 8, 9, 10, 11, 12, 13, 18, 19, 21, 22, 23, 24, 25, 26, 27, 28, and 29. and 4 Quadrants of alveoloplasty      03/26/2015 Surgery    He underwent tracheostomy      03/31/2015 Surgery    He had open gastrostomy tube placement by Dr. Donne Hazel      04/16/2015 - 05/18/2015 Radiation Therapy    Laryngopharynx and bilateral neck / 50 Gy in 20 fractions to gross disease, 45 Gy in 20 fractions to high risk nodal echelons  Beams/energy: Helical IMRT / 6 MV photons      04/16/2015 Procedure    Fluoroscopic reposition of the 18 French gastrostomy confirmed back in the stomach,      07/03/2015 Procedure    IR performed  replacement of gastrostomy tube with a new 70 French balloon retention tube      10/01/2015 Imaging    PEt scan showed persistent hypermetabolism within the primary supraglottic laryngeal tumor and within right retropharyngeal, bilateral level II and left level IV cervical nodal metastases      10/28/2015 Procedure    He had placement of PICC line. The IR was not able to place PORT due to suspected upper respiratory infection      11/13/2015 - 03/21/2016 Chemotherapy    He received 5FU, carboplatin chemo with weekly Erbitux      11/19/2015 Surgery    Gastrostomy tube replaced.      11/30/2015 Procedure    Placement of right jugular port-a-cath.      11/30/2015 Procedure    PICC removed.      12/30/2015 Imaging    PET CT showed positive response to Rx      02/26/2016 Procedure    He underwent direct laryngoscopy with biopsy. Esophageal dilatation.       02/26/2016 Pathology Results    Repeat biopsy of supraglottis showed persistent disease      02/29/2016 Procedure    Gastrostomy tube exchanged.      03/28/2016 PET scan    Hypermetabolic tissue in the posterior RIGHT hypopharynx is similar in pattern to PET-CT of 12/30/2015 but increased in metabolic activity.2. Hypermetabolic tissue /  lymph nodes in the LEFT supraclavicular nodal station are in a similar pattern       04/04/2016 -  Chemotherapy    He started on palliative chemotherapy with pembrolizumab      05/03/2016 Imaging    MRI head is negative      06/02/2016 Imaging    Mild improvement of diffuse pharyngeal and supraglottic edema.Increased asymmetry of right-sided hypopharyngeal/supraglottic softtissue compared to the prior neck CT with FDG uptake in this region on prior PET-CT. Residual/recurrent tumor is possible      08/09/2016 Procedure    IR placed new 20 French percutaneous gastrostomy tube.      08/26/2016 Imaging    Ct neck showed unchanged diffuse pharyngeal and supraglottic edema as well as  asymmetric soft tissue thickening on the right. Slightly decreased size of some left level II lymph nodes. Unchanged lymphadenopathy elsewhere in the neck. Decreased size of thyroid mass. No evidence of metastatic cancer to the chest       INTERVAL HISTORY: Please see below for problem oriented charting. He is seen prior to cycle 8 of treatment. He feels well. Denies swallowing difficulties. He continued to smoke and drink sporadically. He denies worsening pain. No recent hemoptysis.  REVIEW OF SYSTEMS:   Constitutional: Denies fevers, chills or abnormal weight loss Eyes: Denies blurriness of vision Ears, nose, mouth, throat, and face: Denies mucositis or sore throat Respiratory: Denies cough, dyspnea or wheezes Cardiovascular: Denies palpitation, chest discomfort or lower extremity swelling Gastrointestinal:  Denies nausea, heartburn or change in bowel habits Skin: Denies abnormal skin rashes Lymphatics: Denies new lymphadenopathy or easy bruising Neurological:Denies numbness, tingling or new weaknesses Behavioral/Psych: Mood is stable, no new changes  All other systems were reviewed with the patient and are negative.  I have reviewed the past medical history, past surgical history, social history and family history with the patient and they are unchanged from previous note.  ALLERGIES:  is allergic to aspirin.  MEDICATIONS:  Current Outpatient Prescriptions  Medication Sig Dispense Refill  . fluticasone (FLONASE) 50 MCG/ACT nasal spray Place 2 sprays into both nostrils daily. 16 g 2  . guaiFENesin (ROBITUSSIN) 100 MG/5ML SOLN Take 5 mLs (100 mg total) by mouth every 4 (four) hours as needed for cough or to loosen phlegm. 473 mL 3  . lansoprazole (PREVACID) 30 MG capsule Take 1 capsule (30 mg total) by mouth 2 (two) times daily before a meal. 60 capsule 6  . levothyroxine (SYNTHROID, LEVOTHROID) 50 MCG tablet Take 1 tablet (50 mcg total) by mouth daily before breakfast. 30  tablet 9  . lidocaine (XYLOCAINE) 2 % solution Use as directed 15 mLs in the mouth or throat every 8 (eight) hours as needed for mouth pain. 100 mL 0  . LORazepam (ATIVAN) 1 MG tablet Take 1 tablet (1 mg total) by mouth 2 (two) times daily as needed for anxiety (or nausea). 30 tablet 0  . metoCLOPramide (REGLAN) 5 MG/5ML solution Place 5 mLs (5 mg total) into feeding tube 4 (four) times daily -  before meals and at bedtime. 240 mL 1  . morphine (MS CONTIN) 30 MG 12 hr tablet Take 1 tablet (30 mg total) by mouth every 12 (twelve) hours. 60 tablet 0  . morphine (MSIR) 30 MG tablet Take 1 tablet (30 mg total) by mouth every 4 (four) hours as needed for severe pain. 90 tablet 0  . Nutritional Supplements (FEEDING SUPPLEMENT, JEVITY 1.5 CAL/FIBER,) LIQD Give 1 can Osmolite 1.2 + 1 can of  Jevity 1.5 QID via PEG with 60 cc free water before and after bolus. Flush with 240 cc free water BID between feedings. 948 mL   . omeprazole (PRILOSEC) 20 MG capsule Take 1 capsule (20 mg total) by mouth 2 (two) times daily. 60 capsule 0  . polyethylene glycol (MIRALAX) packet Take 17 g by mouth daily. 14 each 0  . predniSONE (DELTASONE) 10 MG tablet Take 1 tablet (10 mg total) by mouth daily with breakfast. 30 tablet 9  . SODIUM CHLORIDE, EXTERNAL, 0.9 % SOLN Use to clean around Trach and perform Trach care once daily and PRN 1000 mL 11  . zolpidem (AMBIEN) 5 MG tablet Take 1 tablet (5 mg total) by mouth at bedtime as needed for sleep. 30 tablet 0  . ondansetron (ZOFRAN) 8 MG tablet Take 1 tablet (8 mg total) by mouth every 8 (eight) hours as needed for nausea. (Patient not taking: Reported on 08/29/2016) 60 tablet 3   No current facility-administered medications for this visit.    Facility-Administered Medications Ordered in Other Visits  Medication Dose Route Frequency Provider Last Rate Last Dose  . oxyCODONE-acetaminophen (PERCOCET/ROXICET) 5-325 MG per tablet 2 tablet  2 tablet Oral Once [name], MD         PHYSICAL EXAMINATION: ECOG PERFORMANCE STATUS: 1 - Symptomatic but completely ambulatory  Vitals:   08/29/16 1128  BP: 116/76  Pulse: 80  Resp: 18  Temp: 98.7 F (37.1 C)   Filed Weights   08/29/16 1128  Weight: 165 lb 8 oz (75.1 kg)    GENERAL:alert, no distress and comfortable SKIN: skin color, texture, turgor are normal, no rashes or significant lesions EYES: normal, Conjunctiva are pink and non-injected, sclera clear OROPHARYNX:no exudate, no erythema and lips, buccal mucosa, and tongue normal  NECK: Tracheostomy site looks okay  LYMPH:  no palpable lymphadenopathy in the cervical, axillary or inguinal LUNGS: clear to auscultation and percussion with normal breathing effort HEART: regular rate & rhythm and no murmurs and no lower extremity edema ABDOMEN:abdomen soft, non-tender and normal bowel sounds. Feeding tube site looks okay  Musculoskeletal:no cyanosis of digits and no clubbing  NEURO: alert & oriented x 3 with fluent speech, no focal motor/sensory deficits  LABORATORY DATA:  I have reviewed the data as listed    Component Value Date/Time   NA 140 08/29/2016 1027   K 3.9 08/29/2016 1027   CL 99 (L) 01/02/2016 1433   CO2 29 08/29/2016 1027   GLUCOSE 115 08/29/2016 1027   BUN 14.2 08/29/2016 1027   CREATININE 0.7 08/29/2016 1027   CALCIUM 9.5 08/29/2016 1027   PROT 7.4 08/29/2016 1027   ALBUMIN 3.7 08/29/2016 1027   AST 14 08/29/2016 1027   ALT 14 08/29/2016 1027   ALKPHOS 51 08/29/2016 1027   BILITOT 0.37 08/29/2016 1027   GFRNONAA >60 01/02/2016 1422   GFRAA >60 01/02/2016 1422    No results found for: SPEP, UPEP  Lab Results  Component Value Date   WBC 12.7 (H) 08/29/2016   NEUTROABS 11.0 (H) 08/29/2016   HGB 12.8 (L) 08/29/2016   HCT 37.9 (L) 08/29/2016   MCV 100.5 (H) 08/29/2016   PLT 163 08/29/2016      Chemistry      Component Value Date/Time   NA 140 08/29/2016 1027   K 3.9 08/29/2016 1027   CL 99 (L) 01/02/2016 1433   CO2 29  08/29/2016 1027   BUN 14.2 08/29/2016 1027   CREATININE 0.7 08/29/2016 1027  Component Value Date/Time   CALCIUM 9.5 08/29/2016 1027   ALKPHOS 51 08/29/2016 1027   AST 14 08/29/2016 1027   ALT 14 08/29/2016 1027   BILITOT 0.37 08/29/2016 1027       RADIOGRAPHIC STUDIES:I reviewed recent CT scan with him I have personally reviewed the radiological images as listed and agreed with the findings in the report.    ASSESSMENT & PLAN:  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[facility]) He tolerated treatment well Pain control has improved. Repeat CT scan show positive response to treatment. I plan to continue the same without dosage adjustment. His next CT scan can be done after Christmas. I recommend he reduces prednisone to every other day and I plan to discontinue prednisone when I see him back next month  Cancer associated pain This is overall improved. He can continue to take IR morphine as needed for pain  Gastrostomy tube in place [facility]) The feeding tube looks ok without signs of infection  Tobacco abuse I continue to encourage him to quit smoking.   Alcohol abuse The patient continues to drink alcohol. Again I discussed the importance of remaining abstinent while on treatment  Acquired hypothyroidism The patient has acquire hypothyroidism likely related to prior exposure to radiation treatment. He is gaining weight. I will follow on thyroid function tests closely in his next visit.   No orders of the defined types were placed in this encounter.  All questions were answered. The patient knows to call the clinic with any problems, questions or concerns. No barriers to learning was detected. I spent 25 minutes counseling the patient face to face. The total time spent in the appointment was 30 minutes and more than 50% was on counseling and review of test results     [facility], [place], MD 08/29/2016 12:11 PM

## 2016-08-29 NOTE — Telephone Encounter (Signed)
Per LOS I have scheduled appts  

## 2016-08-29 NOTE — Assessment & Plan Note (Signed)
He tolerated treatment well Pain control has improved. Repeat CT scan show positive response to treatment. I plan to continue the same without dosage adjustment. His next CT scan can be done after Christmas. I recommend he reduces prednisone to every other day and I plan to discontinue prednisone when I see him back next month

## 2016-08-29 NOTE — Assessment & Plan Note (Signed)
The patient has acquire hypothyroidism likely related to prior exposure to radiation treatment. He is gaining weight. I will follow on thyroid function tests closely in his next visit.

## 2016-08-29 NOTE — Assessment & Plan Note (Signed)
This is overall improved. He can continue to take IR morphine as needed for pain

## 2016-08-29 NOTE — Assessment & Plan Note (Signed)
The patient continues to drink alcohol. Again I discussed the importance of remaining abstinent while on treatment

## 2016-09-09 MED FILL — LEVOTHYROXINE 50 MCG TABLET: 50 | 30 days supply | Qty: 30 | Fill #1

## 2016-09-09 MED FILL — OMEPRAZOLE 20 MG CAPSULE DR: 20 | 30 days supply | Qty: 60 | Fill #5

## 2016-09-09 MED FILL — predniSONE 10 MG TABS: 10 | 30 days supply | Qty: 30 | Fill #1

## 2016-09-19 ENCOUNTER — Other Ambulatory Visit: Payer: Self-pay

## 2016-09-19 ENCOUNTER — Ambulatory Visit: Payer: Self-pay | Admitting: Hematology and Oncology

## 2016-09-19 ENCOUNTER — Other Ambulatory Visit (HOSPITAL_BASED_OUTPATIENT_CLINIC_OR_DEPARTMENT_OTHER): Payer: Medicaid Other

## 2016-09-19 ENCOUNTER — Ambulatory Visit: Payer: Medicaid Other

## 2016-09-19 ENCOUNTER — Ambulatory Visit (HOSPITAL_BASED_OUTPATIENT_CLINIC_OR_DEPARTMENT_OTHER): Payer: Medicaid Other

## 2016-09-19 VITALS — BP 117/77 | HR 72 | Temp 99.2°F | Resp 17

## 2016-09-19 DIAGNOSIS — Z5112 Encounter for antineoplastic immunotherapy: Secondary | ICD-10-CM

## 2016-09-19 DIAGNOSIS — Z79899 Other long term (current) drug therapy: Secondary | ICD-10-CM | POA: Diagnosis not present

## 2016-09-19 DIAGNOSIS — R5383 Other fatigue: Secondary | ICD-10-CM

## 2016-09-19 DIAGNOSIS — C321 Malignant neoplasm of supraglottis: Secondary | ICD-10-CM

## 2016-09-19 DIAGNOSIS — Z95828 Presence of other vascular implants and grafts: Secondary | ICD-10-CM

## 2016-09-19 LAB — COMPREHENSIVE METABOLIC PANEL
ALK PHOS: 46 U/L (ref 40–150)
ALT: 15 U/L (ref 0–55)
AST: 15 U/L (ref 5–34)
Albumin: 3.6 g/dL (ref 3.5–5.0)
Anion Gap: 10 mEq/L (ref 3–11)
BUN: 16.2 mg/dL (ref 7.0–26.0)
CO2: 27 meq/L (ref 22–29)
CREATININE: 0.8 mg/dL (ref 0.7–1.3)
Calcium: 9.4 mg/dL (ref 8.4–10.4)
Chloride: 103 mEq/L (ref 98–109)
GLUCOSE: 121 mg/dL (ref 70–140)
Potassium: 3.6 mEq/L (ref 3.5–5.1)
SODIUM: 140 meq/L (ref 136–145)
TOTAL PROTEIN: 7.2 g/dL (ref 6.4–8.3)
Total Bilirubin: 0.36 mg/dL (ref 0.20–1.20)

## 2016-09-19 LAB — TSH: TSH: 2.821 m[IU]/L (ref 0.320–4.118)

## 2016-09-19 LAB — CBC WITH DIFFERENTIAL/PLATELET
BASO%: 0.2 % (ref 0.0–2.0)
Basophils Absolute: 0 10*3/uL (ref 0.0–0.1)
EOS ABS: 0.1 10*3/uL (ref 0.0–0.5)
EOS%: 0.6 % (ref 0.0–7.0)
HCT: 38.1 % — ABNORMAL LOW (ref 38.4–49.9)
HGB: 12.8 g/dL — ABNORMAL LOW (ref 13.0–17.1)
LYMPH%: 5.7 % — AB (ref 14.0–49.0)
MCH: 34 pg — ABNORMAL HIGH (ref 27.2–33.4)
MCHC: 33.6 g/dL (ref 32.0–36.0)
MCV: 101.3 fL — AB (ref 79.3–98.0)
MONO#: 1 10*3/uL — ABNORMAL HIGH (ref 0.1–0.9)
MONO%: 10.7 % (ref 0.0–14.0)
NEUT%: 82.8 % — ABNORMAL HIGH (ref 39.0–75.0)
NEUTROS ABS: 8 10*3/uL — AB (ref 1.5–6.5)
Platelets: 154 10*3/uL (ref 140–400)
RBC: 3.76 10*6/uL — AB (ref 4.20–5.82)
RDW: 14.9 % — AB (ref 11.0–14.6)
WBC: 9.7 10*3/uL (ref 4.0–10.3)
lymph#: 0.6 10*3/uL — ABNORMAL LOW (ref 0.9–3.3)

## 2016-09-19 MED ORDER — HEPARIN SOD (PORK) LOCK FLUSH 100 UNIT/ML IV SOLN
500.0000 [IU] | Freq: Once | INTRAVENOUS | Status: AC | PRN
  Administered 2016-09-19: 500 [IU]
  Filled 2016-09-19: qty 5

## 2016-09-19 MED ORDER — SODIUM CHLORIDE 0.9 % IV SOLN
200.0000 mg | Freq: Once | INTRAVENOUS | Status: AC
  Administered 2016-09-19: 200 mg via INTRAVENOUS
  Filled 2016-09-19: qty 8

## 2016-09-19 MED ORDER — SODIUM CHLORIDE 0.9% FLUSH
10.0000 mL | INTRAVENOUS | Status: DC | PRN
  Administered 2016-09-19: 10 mL
  Filled 2016-09-19: qty 10

## 2016-09-19 MED ORDER — SODIUM CHLORIDE 0.9 % IV SOLN
Freq: Once | INTRAVENOUS | Status: AC
  Administered 2016-09-19: 13:00:00 via INTRAVENOUS

## 2016-09-19 MED ORDER — SODIUM CHLORIDE 0.9% FLUSH
10.0000 mL | INTRAVENOUS | Status: DC | PRN
  Administered 2016-09-19: 10 mL via INTRAVENOUS
  Filled 2016-09-19: qty 10

## 2016-09-19 NOTE — Patient Instructions (Signed)
[facility] Discharge Instructions for Patients Receiving Chemotherapy  Today you received the following chemotherapy agents: Keytruda   To help prevent nausea and vomiting after your treatment, we encourage you to take your nausea medication as directed    If you develop nausea and vomiting that is not controlled by your nausea medication, call the clinic.   BELOW ARE SYMPTOMS THAT SHOULD BE REPORTED IMMEDIATELY:  *FEVER GREATER THAN 100.5 F  *CHILLS WITH OR WITHOUT FEVER  NAUSEA AND VOMITING THAT IS NOT CONTROLLED WITH YOUR NAUSEA MEDICATION  *UNUSUAL SHORTNESS OF BREATH  *UNUSUAL BRUISING OR BLEEDING  TENDERNESS IN MOUTH AND THROAT WITH OR WITHOUT PRESENCE OF ULCERS  *URINARY PROBLEMS  *BOWEL PROBLEMS  UNUSUAL RASH Items with * indicate a potential emergency and should be followed up as soon as possible.  Feel free to call the clinic you have any questions or concerns. The clinic phone number is [phone].  Please show the CHEMO ALERT CARD at check-in to the Emergency Department and triage nurse.   

## 2016-09-26 ENCOUNTER — Other Ambulatory Visit: Payer: Self-pay | Admitting: *Deleted

## 2016-09-26 ENCOUNTER — Telehealth: Payer: Self-pay | Admitting: Medical Oncology

## 2016-09-26 MED ORDER — MORPHINE SULFATE 30 MG PO TABS: 30.0000 mg | ORAL_TABLET | ORAL | 0 refills | Status: DC | PRN

## 2016-09-26 NOTE — Telephone Encounter (Signed)
Informed pt of Rx ready to pick up.  He will be by this afternoon.

## 2016-09-26 NOTE — Telephone Encounter (Signed)
Requests refill MSIR-he has one left. Call cell when ready for pick up . [phone]

## 2016-09-27 MED FILL — MORPHINE SULFATE IR 30 MG T: 30 | 15 days supply | Qty: 90 | Fill #0

## 2016-10-10 ENCOUNTER — Ambulatory Visit (HOSPITAL_BASED_OUTPATIENT_CLINIC_OR_DEPARTMENT_OTHER): Payer: Medicaid Other | Admitting: Hematology and Oncology

## 2016-10-10 ENCOUNTER — Ambulatory Visit: Payer: Medicaid Other

## 2016-10-10 ENCOUNTER — Ambulatory Visit (HOSPITAL_BASED_OUTPATIENT_CLINIC_OR_DEPARTMENT_OTHER): Payer: Medicaid Other

## 2016-10-10 ENCOUNTER — Encounter: Payer: Self-pay | Admitting: Hematology and Oncology

## 2016-10-10 ENCOUNTER — Other Ambulatory Visit (HOSPITAL_BASED_OUTPATIENT_CLINIC_OR_DEPARTMENT_OTHER): Payer: Medicaid Other

## 2016-10-10 ENCOUNTER — Telehealth: Payer: Self-pay | Admitting: Hematology and Oncology

## 2016-10-10 ENCOUNTER — Encounter: Payer: Medicaid Other | Admitting: Nutrition

## 2016-10-10 VITALS — BP 121/71 | HR 85 | Temp 98.8°F | Resp 18 | Ht 70.0 in | Wt 171.2 lb

## 2016-10-10 DIAGNOSIS — Z5112 Encounter for antineoplastic immunotherapy: Secondary | ICD-10-CM | POA: Diagnosis not present

## 2016-10-10 DIAGNOSIS — E038 Other specified hypothyroidism: Secondary | ICD-10-CM

## 2016-10-10 DIAGNOSIS — Z930 Tracheostomy status: Secondary | ICD-10-CM

## 2016-10-10 DIAGNOSIS — C321 Malignant neoplasm of supraglottis: Secondary | ICD-10-CM

## 2016-10-10 DIAGNOSIS — E039 Hypothyroidism, unspecified: Secondary | ICD-10-CM

## 2016-10-10 DIAGNOSIS — G893 Neoplasm related pain (acute) (chronic): Secondary | ICD-10-CM | POA: Diagnosis not present

## 2016-10-10 DIAGNOSIS — G4700 Insomnia, unspecified: Secondary | ICD-10-CM

## 2016-10-10 DIAGNOSIS — R5383 Other fatigue: Secondary | ICD-10-CM

## 2016-10-10 DIAGNOSIS — Z931 Gastrostomy status: Secondary | ICD-10-CM

## 2016-10-10 LAB — COMPREHENSIVE METABOLIC PANEL
ALT: 17 U/L (ref 0–55)
AST: 16 U/L (ref 5–34)
Albumin: 3.7 g/dL (ref 3.5–5.0)
Alkaline Phosphatase: 52 U/L (ref 40–150)
Anion Gap: 8 mEq/L (ref 3–11)
BUN: 14.6 mg/dL (ref 7.0–26.0)
CALCIUM: 9.7 mg/dL (ref 8.4–10.4)
CHLORIDE: 103 meq/L (ref 98–109)
CO2: 29 meq/L (ref 22–29)
CREATININE: 0.7 mg/dL (ref 0.7–1.3)
EGFR: >90 mL/min/{1.73_m2} (ref >90)
GLUCOSE: 119 mg/dL (ref 70–140)
POTASSIUM: 3.6 meq/L (ref 3.5–5.1)
SODIUM: 140 meq/L (ref 136–145)
Total Bilirubin: 0.42 mg/dL (ref 0.20–1.20)
Total Protein: 7.4 g/dL (ref 6.4–8.3)

## 2016-10-10 LAB — CBC WITH DIFFERENTIAL/PLATELET
BASO%: 0.2 % (ref 0.0–2.0)
Basophils Absolute: 0 10*3/uL (ref 0.0–0.1)
EOS%: 0.8 % (ref 0.0–7.0)
Eosinophils Absolute: 0.1 10*3/uL (ref 0.0–0.5)
HEMATOCRIT: 39.7 % (ref 38.4–49.9)
HGB: 13.1 g/dL (ref 13.0–17.1)
LYMPH#: 0.4 10*3/uL — AB (ref 0.9–3.3)
LYMPH%: 3.9 % — ABNORMAL LOW (ref 14.0–49.0)
MCH: 33.4 pg (ref 27.2–33.4)
MCHC: 32.9 g/dL (ref 32.0–36.0)
MCV: 101.3 fL — ABNORMAL HIGH (ref 79.3–98.0)
MONO#: 0.8 10*3/uL (ref 0.1–0.9)
MONO%: 7.6 % (ref 0.0–14.0)
NEUT#: 9.7 10*3/uL — ABNORMAL HIGH (ref 1.5–6.5)
NEUT%: 87.5 % — AB (ref 39.0–75.0)
Platelets: 202 10*3/uL (ref 140–400)
RBC: 3.92 10*6/uL — ABNORMAL LOW (ref 4.20–5.82)
RDW: 14.9 % — AB (ref 11.0–14.6)
WBC: 11 10*3/uL — ABNORMAL HIGH (ref 4.0–10.3)

## 2016-10-10 LAB — TSH: TSH: 1.622 m(IU)/L (ref 0.320–4.118)

## 2016-10-10 MED ORDER — HYDROMORPHONE HCL 4 MG/ML IJ SOLN: 2.0000 mg | INTRAMUSCULAR | Status: DC | PRN

## 2016-10-10 MED ORDER — HEPARIN SOD (PORK) LOCK FLUSH 100 UNIT/ML IV SOLN
500.0000 [IU] | Freq: Once | INTRAVENOUS | Status: AC | PRN
  Administered 2016-10-10: 500 [IU]
  Filled 2016-10-10: qty 5

## 2016-10-10 MED ORDER — ZOLPIDEM TARTRATE 5 MG PO TABS: 5.0000 mg | ORAL_TABLET | Freq: Every evening | ORAL | 0 refills | Status: DC | PRN

## 2016-10-10 MED ORDER — SODIUM CHLORIDE 0.9 % IV SOLN
200.0000 mg | Freq: Once | INTRAVENOUS | Status: AC
  Administered 2016-10-10: 200 mg via INTRAVENOUS
  Filled 2016-10-10: qty 8

## 2016-10-10 MED ORDER — TRIAMCINOLONE ACETONIDE 55 MCG/ACT NA AERO: 2.0000 | INHALATION_SPRAY | Freq: Every day | NASAL | 12 refills | Status: DC

## 2016-10-10 MED ORDER — LEVOTHYROXINE SODIUM 50 MCG PO TABS: 50.0000 ug | ORAL_TABLET | Freq: Every day | ORAL | 9 refills | Status: DC

## 2016-10-10 MED ORDER — SODIUM CHLORIDE 0.9 % IV SOLN
Freq: Once | INTRAVENOUS | Status: AC
  Administered 2016-10-10: 14:00:00 via INTRAVENOUS

## 2016-10-10 MED ORDER — SODIUM CHLORIDE 0.9 % IJ SOLN
10.0000 mL | INTRAMUSCULAR | Status: DC | PRN
Start: 2016-10-10 — End: 2016-10-10
  Administered 2016-10-10: 10 mL
  Filled 2016-10-10: qty 10

## 2016-10-10 MED ORDER — SODIUM CHLORIDE 0.9% FLUSH
10.0000 mL | INTRAVENOUS | Status: DC | PRN
  Administered 2016-10-10: 10 mL
  Filled 2016-10-10: qty 10

## 2016-10-10 MED FILL — LEVOTHYROXINE 50 MCG TABLET: 50 | 30 days supply | Qty: 30 | Fill #2

## 2016-10-10 MED FILL — ZOLPIDEM TARTRATE 5 MG TAB: 5 | 30 days supply | Qty: 30 | Fill #0

## 2016-10-10 NOTE — Assessment & Plan Note (Signed)
The patient has acquire hypothyroidism likely related to prior exposure to radiation treatment. He is gaining weight. I will follow on thyroid function tests closely in his next visit.

## 2016-10-10 NOTE — Assessment & Plan Note (Signed)
The feeding tube looks ok without signs of infection

## 2016-10-10 NOTE — Assessment & Plan Note (Signed)
He tolerated treatment well Repeat CT scan In September 2017 showed positive response to treatment. I plan to continue the same without dosage adjustment. His next CT scan can be done after Christmas. I recommend he reduces prednisone to every other day and I plan to discontinue prednisone when I see him back next month

## 2016-10-10 NOTE — Assessment & Plan Note (Signed)
I recommend close follow-up with ENT. It does not look infected

## 2016-10-10 NOTE — Progress Notes (Signed)
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[place])   03/15/2015 - 03/17/2015 Hospital Admission    He was admitted to the hospital for evaluation of dysphagia, SOB, hemoptosis, hoarseness, 30-40 pound weight loss and worsening bilateral neck masses for 5 months      03/15/2015 Imaging    Ct showed extensive circumferential malignancy in the hypopharyngeal/supraglottic region with regional LN metastases      03/16/2015 Procedure    He underwent ULTRASOUND-GUIDED BIOPSY OF LEFT CERVICAL LYMPH NODES      03/16/2015 Pathology Results    Accession: [id number] LN biopsy showed invasive squamous cell cancer      03/16/2015 Pathology Results    Accession: [id number] showed atypical squamous cells      03/25/2015 - 04/07/2015 Hospital Admission    He was admitted to the hospital and underwent tracheostomy placement, feeeding tube placement but subsequently left [facility]      03/26/2015 Surgery    He had multiple extraction of tooth numbers 1, 2, 5, 6, 7, 8, 9, 10, 11, 12, 13, 18, 19, 21, 22, 23, 24, 25, 26, 27, 28, and 29. and 4 Quadrants of alveoloplasty      03/26/2015 Surgery    He underwent tracheostomy      03/31/2015 Surgery    He had open gastrostomy tube placement by Dr. Donne Hazel      04/16/2015 - 05/18/2015 Radiation Therapy    Laryngopharynx and bilateral neck / 50 Gy in 20 fractions to gross disease, 45 Gy in 20 fractions to high risk nodal echelons  Beams/energy: Helical IMRT / 6 MV photons      04/16/2015 Procedure    Fluoroscopic reposition of the 18 French gastrostomy confirmed back in the stomach,      07/03/2015 Procedure    IR performed  replacement of gastrostomy tube with a new 70 French balloon retention tube      10/01/2015 Imaging    PEt scan showed persistent hypermetabolism within the primary supraglottic laryngeal tumor and within right retropharyngeal, bilateral level II and left level IV cervical nodal metastases      10/28/2015 Procedure    He had placement of PICC line. The IR was not able to place PORT due to suspected upper respiratory infection      11/13/2015 - 03/21/2016 Chemotherapy    He received 5FU, carboplatin chemo with weekly Erbitux      11/19/2015 Surgery    Gastrostomy tube replaced.      11/30/2015 Procedure    Placement of right jugular port-a-cath.      11/30/2015 Procedure    PICC removed.      12/30/2015 Imaging    PET CT showed positive response to Rx      02/26/2016 Procedure    He underwent direct laryngoscopy with biopsy. Esophageal dilatation.       02/26/2016 Pathology Results    Repeat biopsy of supraglottis showed persistent disease      02/29/2016 Procedure    Gastrostomy tube exchanged.      03/28/2016 PET scan    Hypermetabolic tissue in the posterior RIGHT hypopharynx is similar in pattern to PET-CT of 12/30/2015 but increased in metabolic activity.2. Hypermetabolic tissue /  lymph nodes in the LEFT supraclavicular nodal station are in a similar pattern       04/04/2016 -  Chemotherapy    He started on palliative chemotherapy with pembrolizumab      05/03/2016 Imaging    MRI head is negative      06/02/2016 Imaging    Mild improvement of diffuse pharyngeal and supraglottic edema.Increased asymmetry of right-sided hypopharyngeal/supraglottic softtissue compared to the prior neck CT with FDG uptake in this region on prior PET-CT. Residual/recurrent tumor is possible      08/09/2016 Procedure    IR placed new 20 French percutaneous gastrostomy tube.      08/26/2016 Imaging    Ct neck showed unchanged diffuse pharyngeal and supraglottic edema as well as  asymmetric soft tissue thickening on the right. Slightly decreased size of some left level II lymph nodes. Unchanged lymphadenopathy elsewhere in the neck. Decreased size of thyroid mass. No evidence of metastatic cancer to the chest       INTERVAL HISTORY: Please see below for problem oriented charting. He returns for treatment. He has intermittent neck pain, stable on current prescription Denies nausea or constipation No new neck swelling or lymphadenopathy He feels well. He continues to gain weight He continues to smoke intermittently Denies recent infection  REVIEW OF SYSTEMS:   Constitutional: Denies fevers, chills or abnormal weight loss Eyes: Denies blurriness of vision Ears, nose, mouth, throat, and face: Denies mucositis or sore throat Respiratory: Denies cough, dyspnea or wheezes Cardiovascular: Denies palpitation, chest discomfort or lower extremity swelling Gastrointestinal:  Denies nausea, heartburn or change in bowel habits Skin: Denies abnormal skin rashes Lymphatics: Denies new lymphadenopathy or easy bruising Neurological:Denies numbness, tingling or new weaknesses Behavioral/Psych: Mood is stable, no new changes  All other systems were reviewed with the patient and are negative.  I have reviewed the past medical history, past surgical history, social history and family history with the patient and they are unchanged from previous note.  ALLERGIES:  is allergic to aspirin.  MEDICATIONS:  Current Outpatient Prescriptions  Medication Sig Dispense Refill  . fluticasone (FLONASE) 50 MCG/ACT nasal spray Place 2 sprays into both nostrils daily. 16 g 2  . guaiFENesin (ROBITUSSIN) 100 MG/5ML SOLN Take 5 mLs (100 mg total) by mouth every 4 (four) hours as needed for cough or to loosen phlegm. 473 mL 3  . lansoprazole (PREVACID) 30 MG capsule Take 1 capsule (30 mg total) by mouth 2 (two) times daily before a meal. 60 capsule 6  . levothyroxine (SYNTHROID, LEVOTHROID) 50  MCG tablet Take 1 tablet (50 mcg total) by mouth daily before breakfast. 30 tablet 9  . lidocaine (XYLOCAINE) 2 % solution Use as directed 15 mLs in the mouth or throat every 8 (eight) hours as needed for mouth pain. 100 mL 0  . LORazepam (ATIVAN) 1 MG tablet Take 1 tablet (1 mg total) by mouth 2 (two) times daily as needed for anxiety (or nausea). 30 tablet 0  . metoCLOPramide (REGLAN) 5 MG/5ML solution Place 5 mLs (5 mg total) into feeding tube 4 (four) times daily -  before meals and at bedtime. 240 mL 1  . morphine (MS CONTIN) 30 MG 12 hr tablet Take 1 tablet (30 mg total) by mouth every 12 (twelve) hours. 60 tablet 0  . morphine (MSIR) 30 MG tablet Take 1 tablet (30 mg total) by mouth every 4 (four) hours as needed for severe pain. 90 tablet 0  . Nutritional Supplements (FEEDING SUPPLEMENT, JEVITY 1.5 CAL/FIBER,)  LIQD Give 1 can Osmolite 1.2 + 1 can of Jevity 1.5 QID via PEG with 60 cc free water before and after bolus. Flush with 240 cc free water BID between feedings. 948 mL   . omeprazole (PRILOSEC) 20 MG capsule Take 1 capsule (20 mg total) by mouth 2 (two) times daily. 60 capsule 0  . ondansetron (ZOFRAN) 8 MG tablet Take 1 tablet (8 mg total) by mouth every 8 (eight) hours as needed for nausea. 60 tablet 3  . polyethylene glycol (MIRALAX) packet Take 17 g by mouth daily. 14 each 0  . predniSONE (DELTASONE) 10 MG tablet Take 1 tablet (10 mg total) by mouth daily with breakfast. (Patient taking differently: Take 5 mg by mouth daily with breakfast. Every other day) 30 tablet 9  . SODIUM CHLORIDE, EXTERNAL, 0.9 % SOLN Use to clean around Trach and perform Trach care once daily and PRN 1000 mL 11  . zolpidem (AMBIEN) 5 MG tablet Take 1 tablet (5 mg total) by mouth at bedtime as needed for sleep. 30 tablet 0  . triamcinolone (NASACORT AQ) 55 MCG/ACT AERO nasal inhaler Place 2 sprays into the nose daily. 1 Inhaler 12   No current facility-administered medications for this visit.     PHYSICAL  EXAMINATION: ECOG PERFORMANCE STATUS: 1 - Symptomatic but completely ambulatory  Vitals:   10/10/16 1249  BP: 121/71  Pulse: 85  Resp: 18  Temp: 98.8 F (37.1 C)   Filed Weights   10/10/16 1249  Weight: 171 lb 3.2 oz (77.7 kg)    GENERAL:alert, no distress and comfortable SKIN: skin color, texture, turgor are normal, no rashes or significant lesions EYES: normal, Conjunctiva are pink and non-injected, sclera clear OROPHARYNX:no exudate, no erythema and lips, buccal mucosa, and tongue normal  NECK: tracheostomy site looks ok LYMPH:  no palpable lymphadenopathy in the cervical, axillary or inguinal LUNGS: clear to auscultation and percussion with normal breathing effort HEART: regular rate & rhythm and no murmurs and no lower extremity edema ABDOMEN:abdomen soft, non-tender and normal bowel sounds. Feeding tube site looks ok Musculoskeletal:no cyanosis of digits and no clubbing  NEURO: alert & oriented x 3 with fluent speech, no focal motor/sensory deficits  LABORATORY DATA:  I have reviewed the data as listed    Component Value Date/Time   NA 140 10/10/2016 1230   K 3.6 10/10/2016 1230   CL 99 (L) 01/02/2016 1433   CO2 29 10/10/2016 1230   GLUCOSE 119 10/10/2016 1230   BUN 14.6 10/10/2016 1230   CREATININE 0.7 10/10/2016 1230   CALCIUM 9.7 10/10/2016 1230   PROT 7.4 10/10/2016 1230   ALBUMIN 3.7 10/10/2016 1230   AST 16 10/10/2016 1230   ALT 17 10/10/2016 1230   ALKPHOS 52 10/10/2016 1230   BILITOT 0.42 10/10/2016 1230   GFRNONAA >60 01/02/2016 1422   GFRAA >60 01/02/2016 1422    No results found for: SPEP, UPEP  Lab Results  Component Value Date   WBC 11.0 (H) 10/10/2016   NEUTROABS 9.7 (H) 10/10/2016   HGB 13.1 10/10/2016   HCT 39.7 10/10/2016   MCV 101.3 (H) 10/10/2016   PLT 202 10/10/2016      Chemistry      Component Value Date/Time   NA 140 10/10/2016 1230   K 3.6 10/10/2016 1230   CL 99 (L) 01/02/2016 1433   CO2 29 10/10/2016 1230   BUN 14.6  10/10/2016 1230   CREATININE 0.7 10/10/2016 1230      Component Value Date/Time  CALCIUM 9.7 10/10/2016 1230   ALKPHOS 52 10/10/2016 1230   AST 16 10/10/2016 1230   ALT 17 10/10/2016 1230   BILITOT 0.42 10/10/2016 1230      ASSESSMENT & PLAN:  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 I plan to continue the same without dosage adjustment. His next CT scan can be done after Christmas. I recommend he reduces prednisone to every other day and I plan to discontinue prednisone when I see him back next month  Acquired hypothyroidism The patient has acquire hypothyroidism likely related to prior exposure to radiation treatment. He is gaining weight. I will follow on thyroid function tests closely in his next visit.  Cancer associated pain This is overall improved. He can continue to take IR morphine as needed for pain  Gastrostomy tube in place [facility]) The feeding tube looks ok without signs of infection  Tracheostomy status ([place]) I recommend close follow-up with ENT. It does not look infected    No orders of the defined types were placed in this encounter.  All questions were answered. The patient knows to call the clinic with any problems, questions or concerns. No barriers to learning was detected. I spent 20 minutes counseling the patient face to face. The total time spent in the appointment was 25 minutes and more than 50% was on counseling and review of test results     [name], MD 10/10/2016 1:55 PM

## 2016-10-10 NOTE — Patient Instructions (Signed)
Pembrolizumab injection What is this medicine? PEMBROLIZUMAB (pem broe liz ue mab) is a monoclonal antibody. It is used to treat melanoma and non-small cell lung cancer. This medicine may be used for other purposes; ask your health care provider or pharmacist if you have questions. What should I tell my health care provider before I take this medicine? They need to know if you have any of these conditions: -diabetes -immune system problems -inflammatory bowel disease -liver disease -lung or breathing disease -lupus -an unusual or allergic reaction to pembrolizumab, other medicines, foods, dyes, or preservatives -pregnant or trying to get pregnant -breast-feeding How should I use this medicine? This medicine is for infusion into a vein. It is given by a health care professional in a hospital or clinic setting. A special MedGuide will be given to you before each treatment. Be sure to read this information carefully each time. Talk to your pediatrician regarding the use of this medicine in children. Special care may be needed. Overdosage: If you think you have taken too much of this medicine contact a poison control center or emergency room at once. NOTE: This medicine is only for you. Do not share this medicine with others. What if I miss a dose? It is important not to miss your dose. Call your doctor or health care professional if you are unable to keep an appointment. What may interact with this medicine? Interactions have not been studied. Give your health care provider a list of all the medicines, herbs, non-prescription drugs, or dietary supplements you use. Also tell them if you smoke, drink alcohol, or use illegal drugs. Some items may interact with your medicine. This list may not describe all possible interactions. Give your health care provider a list of all the medicines, herbs, non-prescription drugs, or dietary supplements you use. Also tell them if you smoke, drink alcohol, or  use illegal drugs. Some items may interact with your medicine. What should I watch for while using this medicine? Your condition will be monitored carefully while you are receiving this medicine. You may need blood work done while you are taking this medicine. Do not become pregnant while taking this medicine or for 4 months after stopping it. Women should inform their doctor if they wish to become pregnant or think they might be pregnant. There is a potential for serious side effects to an unborn child. Talk to your health care professional or pharmacist for more information. Do not breast-feed an infant while taking this medicine or for 4 months after the last dose. What side effects may I notice from receiving this medicine? Side effects that you should report to your doctor or health care professional as soon as possible: -allergic reactions like skin rash, itching or hives, swelling of the face, lips, or tongue -bloody or black, tarry stools -breathing problems -change in the amount of urine -changes in vision -chest pain -chills -dark urine -dizziness or feeling faint or lightheaded -fast or irregular heartbeat -fever -flushing -hair loss -muscle pain -muscle weakness -persistent headache -signs and symptoms of high blood sugar such as dizziness; dry mouth; dry skin; fruity breath; nausea; stomach pain; increased hunger or thirst; increased urination -signs and symptoms of liver injury like dark urine, light-colored stools, loss of appetite, nausea, right upper belly pain, yellowing of the eyes or skin -stomach pain -weight loss Side effects that usually do not require medical attention (Report these to your doctor or health care professional if they continue or are bothersome.):constipation -cough -diarrhea -joint pain -  tiredness This list may not describe all possible side effects. Call your doctor for medical advice about side effects. You may report side effects to FDA at  1-800-FDA-1088. Where should I keep my medicine? This drug is given in a hospital or clinic and will not be stored at home. NOTE: This sheet is a summary. It may not cover all possible information. If you have questions about this medicine, talk to your doctor, pharmacist, or health care provider.    2016, [employer]vier/Gold Standard. (2015-01-27 17:24:19)  

## 2016-10-10 NOTE — Progress Notes (Signed)
Nutrition Follow-up:  Follow-up visit completed with patient during infusion for cancer of the supraglottis. Weight documented as 171 pounds 3. 2 oz today, increased from 155 pounds 4 oz on August 7. Reports continued tolerance of combination of osmolite and jevity cans.  "I give 1 can of osmolite and 1 can of jevity 4 -5 times per day (total cans used 6-8 cans per day)". Questioned if pt gives 78ml of water before and after each can of formula and pt reports , "something like that." Pt reports that he does not take anything orally except water.  Does not know how much water he is drinking orally.  Reports that urine is light in color. Pt reports tube is working well and just ordered more tube feeding and should be here tomorrow.    Tube feeding regimen provides 2560 kcals, 113 g of protein and 2267ml of free water.    Labs and medications reviewed: reglan   Nutrition diagnosis: Unintentional weight loss resolved.  Intervention: Encouraged continue use of current tube feeding regimen to provide nutrition. Discussed signs and symptoms of dehydration.  Pt verbalized understanding.  Monitoring, evaluation and goals:  Patient will continue to tolerate tube feeding to meet greater than 90% of estimated energy needs.    Next visit: to be scheduled as needed.  Keishawn Darsey B. Zenia Resides, [place], [place] (pager)

## 2016-10-10 NOTE — Assessment & Plan Note (Signed)
This is overall improved. He can continue to take IR morphine as needed for pain

## 2016-10-10 NOTE — Telephone Encounter (Signed)
Appointments scheduled per 10/30 LOS. Patient given AVS report and calendars with future scheduled appointments. °

## 2016-10-11 ENCOUNTER — Telehealth: Payer: Self-pay | Admitting: *Deleted

## 2016-10-11 NOTE — Telephone Encounter (Signed)
Per the LOS I have scheduled appts and notified the schdeuler

## 2016-10-31 ENCOUNTER — Ambulatory Visit (HOSPITAL_BASED_OUTPATIENT_CLINIC_OR_DEPARTMENT_OTHER): Payer: Medicaid Other | Admitting: Hematology and Oncology

## 2016-10-31 ENCOUNTER — Other Ambulatory Visit: Payer: Self-pay | Admitting: *Deleted

## 2016-10-31 ENCOUNTER — Ambulatory Visit: Payer: Medicaid Other

## 2016-10-31 ENCOUNTER — Ambulatory Visit (HOSPITAL_BASED_OUTPATIENT_CLINIC_OR_DEPARTMENT_OTHER): Payer: Medicaid Other

## 2016-10-31 ENCOUNTER — Other Ambulatory Visit (HOSPITAL_BASED_OUTPATIENT_CLINIC_OR_DEPARTMENT_OTHER): Payer: Medicaid Other

## 2016-10-31 ENCOUNTER — Telehealth: Payer: Self-pay | Admitting: *Deleted

## 2016-10-31 ENCOUNTER — Encounter: Payer: Self-pay | Admitting: Hematology and Oncology

## 2016-10-31 DIAGNOSIS — C321 Malignant neoplasm of supraglottis: Secondary | ICD-10-CM

## 2016-10-31 DIAGNOSIS — G893 Neoplasm related pain (acute) (chronic): Secondary | ICD-10-CM

## 2016-10-31 DIAGNOSIS — Z720 Tobacco use: Secondary | ICD-10-CM | POA: Diagnosis not present

## 2016-10-31 DIAGNOSIS — R5383 Other fatigue: Secondary | ICD-10-CM

## 2016-10-31 DIAGNOSIS — Z430 Encounter for attention to tracheostomy: Secondary | ICD-10-CM | POA: Diagnosis not present

## 2016-10-31 DIAGNOSIS — Z931 Gastrostomy status: Secondary | ICD-10-CM

## 2016-10-31 DIAGNOSIS — Z5112 Encounter for antineoplastic immunotherapy: Secondary | ICD-10-CM | POA: Diagnosis present

## 2016-10-31 DIAGNOSIS — Z79899 Other long term (current) drug therapy: Secondary | ICD-10-CM

## 2016-10-31 LAB — CBC WITH DIFFERENTIAL/PLATELET
BASO%: 0.3 % (ref 0.0–2.0)
Basophils Absolute: 0 10*3/uL (ref 0.0–0.1)
EOS%: 0.5 % (ref 0.0–7.0)
Eosinophils Absolute: 0.1 10*3/uL (ref 0.0–0.5)
HCT: 39.6 % (ref 38.4–49.9)
HGB: 13.3 g/dL (ref 13.0–17.1)
LYMPH%: 4.2 % — AB (ref 14.0–49.0)
MCH: 33.7 pg — ABNORMAL HIGH (ref 27.2–33.4)
MCHC: 33.6 g/dL (ref 32.0–36.0)
MCV: 100.2 fL — ABNORMAL HIGH (ref 79.3–98.0)
MONO#: 1 10*3/uL — ABNORMAL HIGH (ref 0.1–0.9)
MONO%: 9.1 % (ref 0.0–14.0)
NEUT%: 85.9 % — ABNORMAL HIGH (ref 39.0–75.0)
NEUTROS ABS: 9 10*3/uL — AB (ref 1.5–6.5)
Platelets: 204 10*3/uL (ref 140–400)
RBC: 3.96 10*6/uL — AB (ref 4.20–5.82)
RDW: 14.3 % (ref 11.0–14.6)
WBC: 10.5 10*3/uL — AB (ref 4.0–10.3)
lymph#: 0.4 10*3/uL — ABNORMAL LOW (ref 0.9–3.3)

## 2016-10-31 LAB — COMPREHENSIVE METABOLIC PANEL
ALT: 15 U/L (ref 0–55)
AST: 16 U/L (ref 5–34)
Albumin: 3.8 g/dL (ref 3.5–5.0)
Alkaline Phosphatase: 56 U/L (ref 40–150)
Anion Gap: 9 mEq/L (ref 3–11)
BILIRUBIN TOTAL: 0.51 mg/dL (ref 0.20–1.20)
BUN: 15 mg/dL (ref 7.0–26.0)
CO2: 27 meq/L (ref 22–29)
Calcium: 10 mg/dL (ref 8.4–10.4)
Chloride: 104 mEq/L (ref 98–109)
Creatinine: 0.8 mg/dL (ref 0.7–1.3)
Glucose: 121 mg/dl (ref 70–140)
Potassium: 3.5 mEq/L (ref 3.5–5.1)
Sodium: 140 mEq/L (ref 136–145)
TOTAL PROTEIN: 7.7 g/dL (ref 6.4–8.3)

## 2016-10-31 LAB — TSH: TSH: 1.59 m[IU]/L (ref 0.320–4.118)

## 2016-10-31 MED ORDER — MORPHINE SULFATE ER 30 MG PO TBCR: 30.0000 mg | EXTENDED_RELEASE_TABLET | Freq: Two times a day (BID) | ORAL | 0 refills | Status: DC

## 2016-10-31 MED ORDER — SODIUM CHLORIDE 0.9 % IJ SOLN
10.0000 mL | INTRAMUSCULAR | Status: DC | PRN
  Administered 2016-10-31: 10 mL
  Filled 2016-10-31: qty 10

## 2016-10-31 MED ORDER — MORPHINE SULFATE 30 MG PO TABS: 30.0000 mg | ORAL_TABLET | ORAL | 0 refills | Status: DC | PRN

## 2016-10-31 MED ORDER — HEPARIN SOD (PORK) LOCK FLUSH 100 UNIT/ML IV SOLN
500.0000 [IU] | Freq: Once | INTRAVENOUS | Status: AC | PRN
  Administered 2016-10-31: 500 [IU]
  Filled 2016-10-31: qty 5

## 2016-10-31 MED ORDER — SODIUM CHLORIDE 0.9 % IV SOLN
Freq: Once | INTRAVENOUS | Status: AC
  Administered 2016-10-31: 15:00:00 via INTRAVENOUS

## 2016-10-31 MED ORDER — SODIUM CHLORIDE 0.9% FLUSH
10.0000 mL | INTRAVENOUS | Status: DC | PRN
  Administered 2016-10-31: 10 mL
  Filled 2016-10-31: qty 10

## 2016-10-31 MED ORDER — SODIUM CHLORIDE 0.9 % IV SOLN
200.0000 mg | Freq: Once | INTRAVENOUS | Status: AC
  Administered 2016-10-31: 200 mg via INTRAVENOUS
  Filled 2016-10-31: qty 8

## 2016-10-31 MED ORDER — HYDROMORPHONE HCL 4 MG/ML IJ SOLN
INTRAMUSCULAR | Status: AC
  Filled 2016-10-31: qty 1

## 2016-10-31 MED ORDER — FLUTICASONE PROPIONATE 50 MCG/ACT NA SUSP: 2.0000 | Freq: Every day | NASAL | 2 refills | Status: DC

## 2016-10-31 MED ORDER — HYDROMORPHONE HCL 4 MG/ML IJ SOLN
2.0000 mg | INTRAMUSCULAR | Status: DC | PRN
  Administered 2016-10-31: 2 mg via INTRAVENOUS

## 2016-10-31 NOTE — Assessment & Plan Note (Signed)
I continue to encourage him to quit smoking.

## 2016-10-31 NOTE — Assessment & Plan Note (Signed)
He tolerated treatment well Repeat CT scan In September 2017 showed positive response to treatment. I plan to continue the same without dosage adjustment. His next CT scan can be done after Christmas. I recommend he reduces prednisone to every other day and I plan to discontinue prednisone when I see him back next month

## 2016-10-31 NOTE — Progress Notes (Signed)
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[place])   03/15/2015 - 03/17/2015 Hospital Admission    He was admitted to the hospital for evaluation of dysphagia, SOB, hemoptosis, hoarseness, 30-40 pound weight loss and worsening bilateral neck masses for 5 months      03/15/2015 Imaging    Ct showed extensive circumferential malignancy in the hypopharyngeal/supraglottic region with regional LN metastases      03/16/2015 Procedure    He underwent ULTRASOUND-GUIDED BIOPSY OF LEFT CERVICAL LYMPH NODES      03/16/2015 Pathology Results    Accession: [id number] LN biopsy showed invasive squamous cell cancer      03/16/2015 Pathology Results    Accession: [id number] showed atypical squamous cells      03/25/2015 - 04/07/2015 Hospital Admission    He was admitted to the hospital and underwent tracheostomy placement, feeeding tube placement but subsequently left [facility]      03/26/2015 Surgery    He had multiple extraction of tooth numbers 1, 2, 5, 6, 7, 8, 9, 10, 11, 12, 13, 18, 19, 21, 22, 23, 24, 25, 26, 27, 28, and 29. and 4 Quadrants of alveoloplasty      03/26/2015 Surgery    He underwent tracheostomy      03/31/2015 Surgery    He had open gastrostomy tube placement by Dr. Donne Hazel      04/16/2015 - 05/18/2015 Radiation Therapy    Laryngopharynx and bilateral neck / 50 Gy in 20 fractions to gross disease, 45 Gy in 20 fractions to high risk nodal echelons  Beams/energy: Helical IMRT / 6 MV photons      04/16/2015 Procedure    Fluoroscopic reposition of the 18 French gastrostomy confirmed back in the stomach,      07/03/2015 Procedure    IR performed  replacement of gastrostomy tube with a new 70 French balloon retention tube      10/01/2015 Imaging    PEt scan showed persistent hypermetabolism within the primary supraglottic laryngeal tumor and within right retropharyngeal, bilateral level II and left level IV cervical nodal metastases      10/28/2015 Procedure    He had placement of PICC line. The IR was not able to place PORT due to suspected upper respiratory infection      11/13/2015 - 03/21/2016 Chemotherapy    He received 5FU, carboplatin chemo with weekly Erbitux      11/19/2015 Surgery    Gastrostomy tube replaced.      11/30/2015 Procedure    Placement of right jugular port-a-cath.      11/30/2015 Procedure    PICC removed.      12/30/2015 Imaging    PET CT showed positive response to Rx      02/26/2016 Procedure    He underwent direct laryngoscopy with biopsy. Esophageal dilatation.       02/26/2016 Pathology Results    Repeat biopsy of supraglottis showed persistent disease      02/29/2016 Procedure    Gastrostomy tube exchanged.      03/28/2016 PET scan    Hypermetabolic tissue in the posterior RIGHT hypopharynx is similar in pattern to PET-CT of 12/30/2015 but increased in metabolic activity.2. Hypermetabolic tissue /  lymph nodes in the LEFT supraclavicular nodal station are in a similar pattern       04/04/2016 -  Chemotherapy    He started on palliative chemotherapy with pembrolizumab      05/03/2016 Imaging    MRI head is negative      06/02/2016 Imaging    Mild improvement of diffuse pharyngeal and supraglottic edema.Increased asymmetry of right-sided hypopharyngeal/supraglottic softtissue compared to the prior neck CT with FDG uptake in this region on prior PET-CT. Residual/recurrent tumor is possible      08/09/2016 Procedure    IR placed new 20 French percutaneous gastrostomy tube.      08/26/2016 Imaging    Ct neck showed unchanged diffuse pharyngeal and supraglottic edema as well as  asymmetric soft tissue thickening on the right. Slightly decreased size of some left level II lymph nodes. Unchanged lymphadenopathy elsewhere in the neck. Decreased size of thyroid mass. No evidence of metastatic cancer to the chest       INTERVAL HISTORY: Please see below for problem oriented charting. He is seen today before treatment. His appetite is stable, without any recent weight change. He continues to have throat pain, well-controlled with current prescription pain medicine. No new lymphadenopathy. Denies recent nausea or vomiting. He continues to smoke, unable to quit  REVIEW OF SYSTEMS:   Constitutional: Denies fevers, chills or abnormal weight loss Eyes: Denies blurriness of vision Respiratory: Denies cough, dyspnea or wheezes Cardiovascular: Denies palpitation, chest discomfort or lower extremity swelling Gastrointestinal:  Denies nausea, heartburn or change in bowel habits Skin: Denies abnormal skin rashes Lymphatics: Denies new lymphadenopathy or easy bruising Neurological:Denies numbness, tingling or new weaknesses Behavioral/Psych: Mood is stable, no new changes  All other systems were reviewed with the patient and are negative.  I have reviewed the past medical history, past surgical history, social history and family history with the patient and they are unchanged from previous note.  ALLERGIES:  is allergic to aspirin.  MEDICATIONS:  Current Outpatient Prescriptions  Medication Sig Dispense Refill  . guaiFENesin (ROBITUSSIN) 100 MG/5ML SOLN Take 5 mLs (100 mg total) by mouth every 4 (four) hours as needed for cough or to loosen phlegm. 473 mL 3  . lansoprazole (PREVACID) 30 MG capsule Take 1 capsule (30 mg total) by mouth 2 (two) times daily before a meal. 60 capsule 6  . levothyroxine (SYNTHROID, LEVOTHROID) 50 MCG tablet Take 1 tablet (50 mcg total) by mouth daily before breakfast. 30 tablet 9  . lidocaine (XYLOCAINE) 2 % solution Use as directed 15 mLs in  the mouth or throat every 8 (eight) hours as needed for mouth pain. 100 mL 0  . LORazepam (ATIVAN) 1 MG tablet Take 1 tablet (1 mg total) by mouth 2 (two) times daily as needed for anxiety (or nausea). 30 tablet 0  . metoCLOPramide (REGLAN) 5 MG/5ML solution Place 5 mLs (5 mg total) into feeding tube 4 (four) times daily -  before meals and at bedtime. 240 mL 1  . morphine (MS CONTIN) 30 MG 12 hr tablet Take 1 tablet (30 mg total) by mouth every 12 (twelve) hours. 60 tablet 0  . morphine (MSIR) 30 MG tablet Take 1 tablet (30 mg total) by mouth every 4 (four) hours as needed for severe pain. 90 tablet 0  . Nutritional Supplements (FEEDING SUPPLEMENT, JEVITY 1.5 CAL/FIBER,) LIQD Give 1 can Osmolite 1.2 + 1 can of Jevity 1.5 QID via PEG with 60 cc free water before and after bolus. Flush with  240 cc free water BID between feedings. 948 mL   . omeprazole (PRILOSEC) 20 MG capsule Take 1 capsule (20 mg total) by mouth 2 (two) times daily. 60 capsule 0  . ondansetron (ZOFRAN) 8 MG tablet Take 1 tablet (8 mg total) by mouth every 8 (eight) hours as needed for nausea. 60 tablet 3  . polyethylene glycol (MIRALAX) packet Take 17 g by mouth daily. 14 each 0  . predniSONE (DELTASONE) 10 MG tablet Take 1 tablet (10 mg total) by mouth daily with breakfast. (Patient taking differently: Take 5 mg by mouth daily with breakfast. Every other day) 30 tablet 9  . SODIUM CHLORIDE, EXTERNAL, 0.9 % SOLN Use to clean around Trach and perform Trach care once daily and PRN 1000 mL 11  . triamcinolone (NASACORT AQ) 55 MCG/ACT AERO nasal inhaler Place 2 sprays into the nose daily. 1 Inhaler 12  . zolpidem (AMBIEN) 5 MG tablet Take 1 tablet (5 mg total) by mouth at bedtime as needed for sleep. 30 tablet 0  . fluticasone (FLONASE) 50 MCG/ACT nasal spray Place 2 sprays into both nostrils daily. (Patient not taking: Reported on 10/31/2016) 16 g 2   No current facility-administered medications for this visit.    Facility-Administered  Medications Ordered in Other Visits  Medication Dose Route Frequency Provider Last Rate Last Dose  . HYDROmorphone (DILAUDID) injection 2 mg  2 mg Intravenous Q2H PRN [name], MD   2 mg at 10/31/16 1439  . sodium chloride flush (NS) 0.9 % injection 10 mL  10 mL Intracatheter PRN [name], MD   10 mL at 10/31/16 1537    PHYSICAL EXAMINATION: ECOG PERFORMANCE STATUS: 1 - Symptomatic but completely ambulatory  Vitals:   10/31/16 1256  BP: 121/78  Pulse: 83  Resp: 16  Temp: 98.7 F (37.1 C)   Filed Weights   10/31/16 1256  Weight: 169 lb (76.7 kg)    GENERAL:alert, no distress and comfortable SKIN: skin color, texture, turgor are normal, no rashes or significant lesions EYES: normal, Conjunctiva are pink and non-injected, sclera clear OROPHARYNX:no exudate, no erythema and lips, buccal mucosa, and tongue normal  NECK: Tracheostomy site looks okay LYMPH:  no palpable lymphadenopathy in the cervical, axillary or inguinal LUNGS: clear to auscultation and percussion with normal breathing effort HEART: regular rate & rhythm and no murmurs and no lower extremity edema ABDOMEN:abdomen soft, non-tender and normal bowel sounds. Feeding tube site looks okay Musculoskeletal:no cyanosis of digits and no clubbing  NEURO: alert & oriented x 3 with fluent speech, no focal motor/sensory deficits  LABORATORY DATA:  I have reviewed the data as listed    Component Value Date/Time   NA 140 10/31/2016 1212   K 3.5 10/31/2016 1212   CL 99 (L) 01/02/2016 1433   CO2 27 10/31/2016 1212   GLUCOSE 121 10/31/2016 1212   BUN 15.0 10/31/2016 1212   CREATININE 0.8 10/31/2016 1212   CALCIUM 10.0 10/31/2016 1212   PROT 7.7 10/31/2016 1212   ALBUMIN 3.8 10/31/2016 1212   AST 16 10/31/2016 1212   ALT 15 10/31/2016 1212   ALKPHOS 56 10/31/2016 1212   BILITOT 0.51 10/31/2016 1212   GFRNONAA >60 01/02/2016 1422   GFRAA >60 01/02/2016 1422    No results found for: SPEP, UPEP  Lab Results   Component Value Date   WBC 10.5 (H) 10/31/2016   NEUTROABS 9.0 (H) 10/31/2016   HGB 13.3 10/31/2016   HCT 39.6 10/31/2016   MCV 100.2 (H) 10/31/2016  PLT 204 10/31/2016      Chemistry      Component Value Date/Time   NA 140 10/31/2016 1212   K 3.5 10/31/2016 1212   CL 99 (L) 01/02/2016 1433   CO2 27 10/31/2016 1212   BUN 15.0 10/31/2016 1212   CREATININE 0.8 10/31/2016 1212      Component Value Date/Time   CALCIUM 10.0 10/31/2016 1212   ALKPHOS 56 10/31/2016 1212   AST 16 10/31/2016 1212   ALT 15 10/31/2016 1212   BILITOT 0.51 10/31/2016 1212      ASSESSMENT & PLAN:  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 I plan to continue the same without dosage adjustment. His next CT scan can be done after Christmas. I recommend he reduces prednisone to every other day and I plan to discontinue prednisone when I see him back next month  Cancer associated pain This is overall improved. He can continue to take MS Contin and IR morphine as needed for pain I refilled his prescriptions today  Tobacco abuse I continue to encourage him to quit smoking.   Gastrostomy tube in place [facility]) The feeding tube looks ok without signs of infection  Tracheostomy care [facility]) I recommend close follow-up with ENT. It does not look infected    No orders of the defined types were placed in this encounter.  All questions were answered. The patient knows to call the clinic with any problems, questions or concerns. No barriers to learning was detected. I spent 25 minutes counseling the patient face to face. The total time spent in the appointment was 30 minutes and more than 50% was on counseling and review of test results     [name], MD 10/31/2016 4:06 PM

## 2016-10-31 NOTE — Assessment & Plan Note (Signed)
The feeding tube looks ok without signs of infection

## 2016-10-31 NOTE — Telephone Encounter (Signed)
Per LOS I have scheduled appts and notified the scheduler 

## 2016-10-31 NOTE — Assessment & Plan Note (Signed)
This is overall improved. He can continue to take MS Contin and IR morphine as needed for pain I refilled his prescriptions today

## 2016-10-31 NOTE — Patient Instructions (Signed)
[facility] Discharge Instructions for Patients Receiving Chemotherapy  Today you received the following chemotherapy agents: Keytruda   To help prevent nausea and vomiting after your treatment, we encourage you to take your nausea medication as directed    If you develop nausea and vomiting that is not controlled by your nausea medication, call the clinic.   BELOW ARE SYMPTOMS THAT SHOULD BE REPORTED IMMEDIATELY:  *FEVER GREATER THAN 100.5 F  *CHILLS WITH OR WITHOUT FEVER  NAUSEA AND VOMITING THAT IS NOT CONTROLLED WITH YOUR NAUSEA MEDICATION  *UNUSUAL SHORTNESS OF BREATH  *UNUSUAL BRUISING OR BLEEDING  TENDERNESS IN MOUTH AND THROAT WITH OR WITHOUT PRESENCE OF ULCERS  *URINARY PROBLEMS  *BOWEL PROBLEMS  UNUSUAL RASH Items with * indicate a potential emergency and should be followed up as soon as possible.  Feel free to call the clinic you have any questions or concerns. The clinic phone number is [phone].  Please show the CHEMO ALERT CARD at check-in to the Emergency Department and triage nurse.   

## 2016-10-31 NOTE — Assessment & Plan Note (Signed)
I recommend close follow-up with ENT. It does not look infected

## 2016-11-11 ENCOUNTER — Telehealth: Payer: Self-pay | Admitting: *Deleted

## 2016-11-11 ENCOUNTER — Other Ambulatory Visit: Payer: Self-pay | Admitting: Hematology and Oncology

## 2016-11-11 DIAGNOSIS — G4700 Insomnia, unspecified: Secondary | ICD-10-CM

## 2016-11-11 MED FILL — LEVOTHYROXINE 50 MCG TABLET: 50 | 30 days supply | Qty: 30 | Fill #3

## 2016-11-11 MED FILL — MORPHINE SULFATE IR 30 MG T: 30 | 15 days supply | Qty: 90 | Fill #0

## 2016-11-11 MED FILL — FLUTICASONE PROP 50 MCG SPR: 50 | 30 days supply | Qty: 16 | Fill #0

## 2016-11-11 MED FILL — predniSONE 10 MG TABS: 10 | 30 days supply | Qty: 30 | Fill #2

## 2016-11-11 MED FILL — MORPHINE SULF ER 30 MG TAB: 30 | 30 days supply | Qty: 60 | Fill #0

## 2016-11-11 NOTE — Telephone Encounter (Signed)
Notified pt of Rx for Ambien ready to pick up at our office.  He says he does not need refill Ambien yet.  He had contacted his pharmacy to refill his Levothyroxine and Prednisone but didn't request Ambien.   He will let us know when he needs refill but says "I have plenty of them."  He confirmed his appts for 12/11.

## 2016-11-14 MED FILL — OMEPRAZOLE 20 MG CAPSULE DR: 20 | 30 days supply | Qty: 60 | Fill #0

## 2016-11-21 ENCOUNTER — Ambulatory Visit: Payer: Self-pay

## 2016-11-21 ENCOUNTER — Telehealth: Payer: Self-pay | Admitting: *Deleted

## 2016-11-21 ENCOUNTER — Telehealth: Payer: Self-pay | Admitting: Hematology and Oncology

## 2016-11-21 ENCOUNTER — Ambulatory Visit: Payer: Self-pay | Admitting: Hematology and Oncology

## 2016-11-21 ENCOUNTER — Other Ambulatory Visit: Payer: Self-pay

## 2016-11-21 NOTE — Telephone Encounter (Signed)
sw pt to confirm r/s appt date/times per LOS °

## 2016-11-21 NOTE — Telephone Encounter (Signed)
Pt called to r/s his appts today to tomorrow. Informed pt no availability for tomorrow.  Dr. Alvy Bimler can see him next week on Tuesday 12/19 at 9 am with lab/flush prior and chemo afterwards.  Scheduling message sent.  Pt agreed/  Verbalized understanding.

## 2016-11-29 ENCOUNTER — Other Ambulatory Visit (HOSPITAL_BASED_OUTPATIENT_CLINIC_OR_DEPARTMENT_OTHER): Payer: Medicaid Other

## 2016-11-29 ENCOUNTER — Encounter: Payer: Self-pay | Admitting: Hematology and Oncology

## 2016-11-29 ENCOUNTER — Ambulatory Visit (HOSPITAL_BASED_OUTPATIENT_CLINIC_OR_DEPARTMENT_OTHER): Payer: Medicaid Other

## 2016-11-29 ENCOUNTER — Telehealth: Payer: Self-pay | Admitting: Hematology and Oncology

## 2016-11-29 ENCOUNTER — Ambulatory Visit: Payer: Medicaid Other

## 2016-11-29 ENCOUNTER — Ambulatory Visit (HOSPITAL_BASED_OUTPATIENT_CLINIC_OR_DEPARTMENT_OTHER): Payer: Medicaid Other | Admitting: Hematology and Oncology

## 2016-11-29 VITALS — BP 120/74 | HR 92 | Temp 98.3°F | Resp 18 | Wt 169.9 lb

## 2016-11-29 DIAGNOSIS — Z430 Encounter for attention to tracheostomy: Secondary | ICD-10-CM | POA: Diagnosis not present

## 2016-11-29 DIAGNOSIS — C321 Malignant neoplasm of supraglottis: Secondary | ICD-10-CM

## 2016-11-29 DIAGNOSIS — J209 Acute bronchitis, unspecified: Secondary | ICD-10-CM

## 2016-11-29 DIAGNOSIS — R5383 Other fatigue: Secondary | ICD-10-CM

## 2016-11-29 DIAGNOSIS — J440 Chronic obstructive pulmonary disease with acute lower respiratory infection: Secondary | ICD-10-CM

## 2016-11-29 DIAGNOSIS — Z720 Tobacco use: Secondary | ICD-10-CM | POA: Diagnosis not present

## 2016-11-29 DIAGNOSIS — Z5112 Encounter for antineoplastic immunotherapy: Secondary | ICD-10-CM

## 2016-11-29 DIAGNOSIS — Z79899 Other long term (current) drug therapy: Secondary | ICD-10-CM | POA: Diagnosis not present

## 2016-11-29 DIAGNOSIS — G893 Neoplasm related pain (acute) (chronic): Secondary | ICD-10-CM

## 2016-11-29 LAB — CBC WITH DIFFERENTIAL/PLATELET
BASO%: 0.5 % (ref 0.0–2.0)
BASOS ABS: 0.1 10*3/uL (ref 0.0–0.1)
EOS ABS: 0.1 10*3/uL (ref 0.0–0.5)
EOS%: 0.8 % (ref 0.0–7.0)
HEMATOCRIT: 40.3 % (ref 38.4–49.9)
HEMOGLOBIN: 13.4 g/dL (ref 13.0–17.1)
LYMPH#: 0.7 10*3/uL — AB (ref 0.9–3.3)
LYMPH%: 6.1 % — ABNORMAL LOW (ref 14.0–49.0)
MCH: 33.1 pg (ref 27.2–33.4)
MCHC: 33.2 g/dL (ref 32.0–36.0)
MCV: 99.6 fL — AB (ref 79.3–98.0)
MONO#: 1 10*3/uL — ABNORMAL HIGH (ref 0.1–0.9)
MONO%: 8.9 % (ref 0.0–14.0)
NEUT#: 9.2 10*3/uL — ABNORMAL HIGH (ref 1.5–6.5)
NEUT%: 83.7 % — AB (ref 39.0–75.0)
PLATELETS: 226 10*3/uL (ref 140–400)
RBC: 4.05 10*6/uL — ABNORMAL LOW (ref 4.20–5.82)
RDW: 14.6 % (ref 11.0–14.6)
WBC: 11 10*3/uL — ABNORMAL HIGH (ref 4.0–10.3)

## 2016-11-29 LAB — COMPREHENSIVE METABOLIC PANEL
ALT: 13 U/L (ref 0–55)
AST: 13 U/L (ref 5–34)
Albumin: 3.8 g/dL (ref 3.5–5.0)
Alkaline Phosphatase: 61 U/L (ref 40–150)
Anion Gap: 9 mEq/L (ref 3–11)
BUN: 16.1 mg/dL (ref 7.0–26.0)
CHLORIDE: 103 meq/L (ref 98–109)
CO2: 28 meq/L (ref 22–29)
CREATININE: 0.8 mg/dL (ref 0.7–1.3)
Calcium: 9.9 mg/dL (ref 8.4–10.4)
EGFR: >90 mL/min/{1.73_m2} (ref >90)
GLUCOSE: 113 mg/dL (ref 70–140)
POTASSIUM: 3.5 meq/L (ref 3.5–5.1)
SODIUM: 140 meq/L (ref 136–145)
Total Bilirubin: 0.53 mg/dL (ref 0.20–1.20)
Total Protein: 7.6 g/dL (ref 6.4–8.3)

## 2016-11-29 LAB — TSH: TSH: 2.929 m(IU)/L (ref 0.320–4.118)

## 2016-11-29 MED ORDER — SODIUM CHLORIDE 0.9 % IV SOLN: 10.0000 mg | Freq: Once | INTRAVENOUS | Status: DC

## 2016-11-29 MED ORDER — HEPARIN SOD (PORK) LOCK FLUSH 100 UNIT/ML IV SOLN
500.0000 [IU] | Freq: Once | INTRAVENOUS | Status: AC | PRN
  Administered 2016-11-29: 500 [IU]
  Filled 2016-11-29: qty 5

## 2016-11-29 MED ORDER — DEXAMETHASONE SODIUM PHOSPHATE 10 MG/ML IJ SOLN
10.0000 mg | Freq: Once | INTRAMUSCULAR | Status: AC
  Administered 2016-11-29: 10 mg via INTRAVENOUS

## 2016-11-29 MED ORDER — SODIUM CHLORIDE 0.9 % IJ SOLN
10.0000 mL | INTRAMUSCULAR | Status: DC | PRN
  Administered 2016-11-29: 10 mL
  Filled 2016-11-29: qty 10

## 2016-11-29 MED ORDER — AMOXICILLIN-POT CLAVULANATE 875-125 MG PO TABS: 1.0000 | ORAL_TABLET | Freq: Two times a day (BID) | ORAL | 0 refills | Status: DC

## 2016-11-29 MED ORDER — SODIUM CHLORIDE 0.9 % IV SOLN
Freq: Once | INTRAVENOUS | Status: AC
  Administered 2016-11-29: 09:00:00 via INTRAVENOUS

## 2016-11-29 MED ORDER — LORAZEPAM 1 MG PO TABS: 1.0000 mg | ORAL_TABLET | Freq: Two times a day (BID) | ORAL | 0 refills | Status: DC | PRN

## 2016-11-29 MED ORDER — PREDNISONE 50 MG PO TABS: 50.0000 mg | ORAL_TABLET | Freq: Every day | ORAL | 0 refills | Status: AC

## 2016-11-29 MED ORDER — HYDROMORPHONE HCL 4 MG/ML IJ SOLN
2.0000 mg | INTRAMUSCULAR | Status: DC | PRN
  Administered 2016-11-29: 2 mg via INTRAVENOUS

## 2016-11-29 MED ORDER — SODIUM CHLORIDE 0.9 % IV SOLN
200.0000 mg | Freq: Once | INTRAVENOUS | Status: AC
  Administered 2016-11-29: 200 mg via INTRAVENOUS
  Filled 2016-11-29: qty 8

## 2016-11-29 MED ORDER — SODIUM CHLORIDE 0.9% FLUSH
10.0000 mL | INTRAVENOUS | Status: DC | PRN
  Administered 2016-11-29: 10 mL
  Filled 2016-11-29: qty 10

## 2016-11-29 MED FILL — predniSONE 50 MG TABS: 50 | 7 days supply | Qty: 7 | Fill #0

## 2016-11-29 MED FILL — LORazepam 1 MG TABS: 1 | 5 days supply | Qty: 10 | Fill #0

## 2016-11-29 MED FILL — AMOX TR-K CLV 875-125 MG TA: 875-125 | 7 days supply | Qty: 14 | Fill #0

## 2016-11-29 NOTE — Telephone Encounter (Signed)
Appointments scheduled per 11/29/16 los. A copy of the appointment schedule and AVS report was given to the patient, per 11/29/16 los. °

## 2016-11-29 NOTE — Assessment & Plan Note (Signed)
He tolerated treatment well Repeat CT scan In September 2017 showed positive response to treatment. I plan to continue the same without dosage adjustment. His next CT scan can be done after the New Year

## 2016-11-29 NOTE — Assessment & Plan Note (Signed)
He has signs of acute bronchitis from COPD The patient continues to smoke. I recommend 7 days course of prednisone and Augmentin. I encouraged patient to quit smoking while on treatment

## 2016-11-29 NOTE — Assessment & Plan Note (Signed)
I continue to encourage him to quit smoking.

## 2016-11-29 NOTE — Assessment & Plan Note (Signed)
The patient had mild bleeding from the tracheostomy site. Clinically, he has signs of acute bronchitis. I encouraged tracheostomy care and antibiotic therapy

## 2016-11-29 NOTE — Assessment & Plan Note (Signed)
This is overall improved. He can continue to take MS Contin and IR morphine as needed for pain

## 2016-11-29 NOTE — Patient Instructions (Signed)
[facility] Discharge Instructions for Patients Receiving Chemotherapy  Today you received the following chemotherapy agents: Keytruda   To help prevent nausea and vomiting after your treatment, we encourage you to take your nausea medication as directed    If you develop nausea and vomiting that is not controlled by your nausea medication, call the clinic.   BELOW ARE SYMPTOMS THAT SHOULD BE REPORTED IMMEDIATELY:  *FEVER GREATER THAN 100.5 F  *CHILLS WITH OR WITHOUT FEVER  NAUSEA AND VOMITING THAT IS NOT CONTROLLED WITH YOUR NAUSEA MEDICATION  *UNUSUAL SHORTNESS OF BREATH  *UNUSUAL BRUISING OR BLEEDING  TENDERNESS IN MOUTH AND THROAT WITH OR WITHOUT PRESENCE OF ULCERS  *URINARY PROBLEMS  *BOWEL PROBLEMS  UNUSUAL RASH Items with * indicate a potential emergency and should be followed up as soon as possible.  Feel free to call the clinic you have any questions or concerns. The clinic phone number is [phone].  Please show the CHEMO ALERT CARD at check-in to the Emergency Department and triage nurse.   

## 2016-11-29 NOTE — Progress Notes (Signed)
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[place])   03/15/2015 - 03/17/2015 Hospital Admission    He was admitted to the hospital for evaluation of dysphagia, SOB, hemoptosis, hoarseness, 30-40 pound weight loss and worsening bilateral neck masses for 5 months      03/15/2015 Imaging    Ct showed extensive circumferential malignancy in the hypopharyngeal/supraglottic region with regional LN metastases      03/16/2015 Procedure    He underwent ULTRASOUND-GUIDED BIOPSY OF LEFT CERVICAL LYMPH NODES      03/16/2015 Pathology Results    Accession: [id number] LN biopsy showed invasive squamous cell cancer      03/16/2015 Pathology Results    Accession: [id number] showed atypical squamous cells      03/25/2015 - 04/07/2015 Hospital Admission    He was admitted to the hospital and underwent tracheostomy placement, feeeding tube placement but subsequently left [facility]      03/26/2015 Surgery    He had multiple extraction of tooth numbers 1, 2, 5, 6, 7, 8, 9, 10, 11, 12, 13, 18, 19, 21, 22, 23, 24, 25, 26, 27, 28, and 29. and 4 Quadrants of alveoloplasty      03/26/2015 Surgery    He underwent tracheostomy      03/31/2015 Surgery    He had open gastrostomy tube placement by Dr. Donne Hazel      04/16/2015 - 05/18/2015 Radiation Therapy    Laryngopharynx and bilateral neck / 50 Gy in 20 fractions to gross disease, 45 Gy in 20 fractions to high risk nodal echelons  Beams/energy: Helical IMRT / 6 MV photons      04/16/2015 Procedure    Fluoroscopic reposition of the 18 French gastrostomy confirmed back in the stomach,      07/03/2015 Procedure    IR performed  replacement of gastrostomy tube with a new 70 French balloon retention tube      10/01/2015 Imaging    PEt scan showed persistent hypermetabolism within the primary supraglottic laryngeal tumor and within right retropharyngeal, bilateral level II and left level IV cervical nodal metastases      10/28/2015 Procedure    He had placement of PICC line. The IR was not able to place PORT due to suspected upper respiratory infection      11/13/2015 - 03/21/2016 Chemotherapy    He received 5FU, carboplatin chemo with weekly Erbitux      11/19/2015 Surgery    Gastrostomy tube replaced.      11/30/2015 Procedure    Placement of right jugular port-a-cath.      11/30/2015 Procedure    PICC removed.      12/30/2015 Imaging    PET CT showed positive response to Rx      02/26/2016 Procedure    He underwent direct laryngoscopy with biopsy. Esophageal dilatation.       02/26/2016 Pathology Results    Repeat biopsy of supraglottis showed persistent disease      02/29/2016 Procedure    Gastrostomy tube exchanged.      03/28/2016 PET scan    Hypermetabolic tissue in the posterior RIGHT hypopharynx is similar in pattern to PET-CT of 12/30/2015 but increased in metabolic activity.2. Hypermetabolic tissue /  lymph nodes in the LEFT supraclavicular nodal station are in a similar pattern       04/04/2016 -  Chemotherapy    He started on palliative chemotherapy with pembrolizumab      05/03/2016 Imaging    MRI head is negative      06/02/2016 Imaging    Mild improvement of diffuse pharyngeal and supraglottic edema.Increased asymmetry of right-sided hypopharyngeal/supraglottic softtissue compared to the prior neck CT with FDG uptake in this region on prior PET-CT. Residual/recurrent tumor is possible      08/09/2016 Procedure    IR placed new 20 French percutaneous gastrostomy tube.      08/26/2016 Imaging    Ct neck showed unchanged diffuse pharyngeal and supraglottic edema as well as  asymmetric soft tissue thickening on the right. Slightly decreased size of some left level II lymph nodes. Unchanged lymphadenopathy elsewhere in the neck. Decreased size of thyroid mass. No evidence of metastatic cancer to the chest       INTERVAL HISTORY: Please see below for problem oriented charting. He returns for follow-up. He started to have some mild coughing, sinus drainage, mouth bleeding near the tracheostomy site but denies fever or chills. He is still smoking. Appetite is stable, denies recent weight loss. He has shortness of breath on moderate exertion He denies worsening pain  REVIEW OF SYSTEMS:   Constitutional: Denies fevers, chills or abnormal weight loss Eyes: Denies blurriness of vision Ears, nose, mouth, throat, and face: Denies mucositis or sore throat Cardiovascular: Denies palpitation, chest discomfort or lower extremity swelling Gastrointestinal:  Denies nausea, heartburn or change in bowel habits Skin: Denies abnormal skin rashes Lymphatics: Denies new lymphadenopathy or easy bruising Neurological:Denies numbness, tingling or new weaknesses Behavioral/Psych: Mood is stable, no new changes  All other systems were reviewed with the patient and are negative.  I have reviewed the past medical history, past surgical history, social history and family history with the patient and they are unchanged from previous note.  ALLERGIES:  is allergic to aspirin.  MEDICATIONS:  Current Outpatient Prescriptions  Medication Sig Dispense Refill  . fluticasone (FLONASE) 50 MCG/ACT nasal spray Place 2 sprays into both nostrils daily. 16 g 2  . guaiFENesin (ROBITUSSIN) 100 MG/5ML SOLN Take 5 mLs (100 mg total) by mouth every 4 (four) hours as needed for cough or to loosen phlegm. 473 mL 3  . lansoprazole (PREVACID) 30 MG capsule Take 1 capsule (30 mg total) by mouth 2 (two) times daily before a meal. 60 capsule 6  . levothyroxine (SYNTHROID, LEVOTHROID) 50 MCG tablet Take 1  tablet (50 mcg total) by mouth daily before breakfast. 30 tablet 9  . lidocaine (XYLOCAINE) 2 % solution Use as directed 15 mLs in the mouth or throat every 8 (eight) hours as needed for mouth pain. 100 mL 0  . LORazepam (ATIVAN) 1 MG tablet Take 1 tablet (1 mg total) by mouth 2 (two) times daily as needed for anxiety (or nausea). 10 tablet 0  . metoCLOPramide (REGLAN) 5 MG/5ML solution Place 5 mLs (5 mg total) into feeding tube 4 (four) times daily -  before meals and at bedtime. 240 mL 1  . morphine (MS CONTIN) 30 MG 12 hr tablet Take 1 tablet (30 mg total) by mouth every 12 (twelve) hours. 60 tablet 0  . morphine (MSIR) 30 MG tablet Take 1 tablet (30 mg total) by mouth every 4 (four) hours as needed for severe pain. 90 tablet 0  . Nutritional Supplements (FEEDING SUPPLEMENT, JEVITY  1.5 CAL/FIBER,) LIQD Give 1 can Osmolite 1.2 + 1 can of Jevity 1.5 QID via PEG with 60 cc free water before and after bolus. Flush with 240 cc free water BID between feedings. 948 mL   . omeprazole (PRILOSEC) 20 MG capsule Take 1 capsule (20 mg total) by mouth 2 (two) times daily. 60 capsule 0  . ondansetron (ZOFRAN) 8 MG tablet Take 1 tablet (8 mg total) by mouth every 8 (eight) hours as needed for nausea. 60 tablet 3  . polyethylene glycol (MIRALAX) packet Take 17 g by mouth daily. 14 each 0  . predniSONE (DELTASONE) 10 MG tablet Take 1 tablet (10 mg total) by mouth daily with breakfast. (Patient taking differently: Take 5 mg by mouth daily with breakfast. Every other day) 30 tablet 9  . SODIUM CHLORIDE, EXTERNAL, 0.9 % SOLN Use to clean around Trach and perform Trach care once daily and PRN 1000 mL 11  . triamcinolone (NASACORT AQ) 55 MCG/ACT AERO nasal inhaler Place 2 sprays into the nose daily. 1 Inhaler 12  . zolpidem (AMBIEN) 5 MG tablet TAKE 1 TABLET BY MOUTH AT BEDTIME AS NEEDED FOR SLEEP 30 tablet 0  . amoxicillin-clavulanate (AUGMENTIN) 875-125 MG tablet Take 1 tablet by mouth 2 (two) times daily. 14 tablet 0   . predniSONE (DELTASONE) 50 MG tablet Take 1 tablet (50 mg total) by mouth daily with breakfast. 7 tablet 0   No current facility-administered medications for this visit.    Facility-Administered Medications Ordered in Other Visits  Medication Dose Route Frequency Provider Last Rate Last Dose  . 0.9 %  sodium chloride infusion   Intravenous Once [name], MD      . heparin lock flush 100 unit/mL  500 Units Intracatheter Once PRN [name], MD      . HYDROmorphone (DILAUDID) injection 2 mg  2 mg Intravenous Q2H PRN [name], MD      . pembrolizumab (KEYTRUDA) 200 mg in sodium chloride 0.9 % 50 mL chemo infusion  200 mg Intravenous Once [name], MD      . sodium chloride flush (NS) 0.9 % injection 10 mL  10 mL Intracatheter PRN [name], MD        PHYSICAL EXAMINATION: ECOG PERFORMANCE STATUS: 1 - Symptomatic but completely ambulatory  Vitals:   11/29/16 0832  BP: 120/74  Pulse: 92  Resp: 18  Temp: 98.3 F (36.8 C)   Filed Weights   11/29/16 0832  Weight: 169 lb 14.4 oz (77.1 kg)    GENERAL:alert, no distress and comfortableHe has mild coughing SKIN: skin color, texture, turgor are normal, no rashes or significant lesions EYES: normal, Conjunctiva are pink and non-injected, sclera clear OROPHARYNX:no exudate, no erythema and lips, buccal mucosa, and tongue normal  NECK: There are signs of infection around the tracheostomy site LYMPH:  no palpable lymphadenopathy in the cervical, axillary or inguinal LUNGS: Mild increased breathing effort. Mild wheezing HEART: regular rate & rhythm and no murmurs and no lower extremity edema ABDOMEN:abdomen soft, non-tender and normal bowel sounds Musculoskeletal:no cyanosis of digits and no clubbing  NEURO: alert & oriented x 3 with fluent speech, no focal motor/sensory deficits  LABORATORY DATA:  I have reviewed the data as listed    Component Value Date/Time   NA 140 11/29/2016 0815   K 3.5 11/29/2016 0815   CL 99 (L)  01/02/2016 1433   CO2 28 11/29/2016 0815   GLUCOSE 113 11/29/2016 0815   BUN 16.1 11/29/2016 0815  CREATININE 0.8 11/29/2016 0815   CALCIUM 9.9 11/29/2016 0815   PROT 7.6 11/29/2016 0815   ALBUMIN 3.8 11/29/2016 0815   AST 13 11/29/2016 0815   ALT 13 11/29/2016 0815   ALKPHOS 61 11/29/2016 0815   BILITOT 0.53 11/29/2016 0815   GFRNONAA >60 01/02/2016 1422   GFRAA >60 01/02/2016 1422    No results found for: SPEP, UPEP  Lab Results  Component Value Date   WBC 11.0 (H) 11/29/2016   NEUTROABS 9.2 (H) 11/29/2016   HGB 13.4 11/29/2016   HCT 40.3 11/29/2016   MCV 99.6 (H) 11/29/2016   PLT 226 11/29/2016      Chemistry      Component Value Date/Time   NA 140 11/29/2016 0815   K 3.5 11/29/2016 0815   CL 99 (L) 01/02/2016 1433   CO2 28 11/29/2016 0815   BUN 16.1 11/29/2016 0815   CREATININE 0.8 11/29/2016 0815      Component Value Date/Time   CALCIUM 9.9 11/29/2016 0815   ALKPHOS 61 11/29/2016 0815   AST 13 11/29/2016 0815   ALT 13 11/29/2016 0815   BILITOT 0.53 11/29/2016 0815      ASSESSMENT & PLAN:  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 I plan to continue the same without dosage adjustment. His next CT scan can be done after the New Year  Acute bronchitis with chronic obstructive pulmonary disease (COPD) ([place]) He has signs of acute bronchitis from COPD The patient continues to smoke. I recommend 7 days course of prednisone and Augmentin. I encouraged patient to quit smoking while on treatment  Cancer associated pain This is overall improved. He can continue to take MS Contin and IR morphine as needed for pain  Tobacco abuse I continue to encourage him to quit smoking.   Tracheostomy care [facility]) The patient had mild bleeding from the tracheostomy site. Clinically, he has signs of acute bronchitis. I encouraged tracheostomy care and antibiotic  therapy   Orders Placed This Encounter  Procedures  . CT CHEST W CONTRAST    Standing Status:   Future    Standing Expiration Date:   01/03/2018    Order Specific Question:   Reason for exam:    Answer:   suprglottic ca, assess response to Rx    Order Specific Question:   Preferred imaging location?    Answer:   [facility]  . CT Soft Tissue Neck W Contrast    Standing Status:   Future    Standing Expiration Date:   11/29/2017    Order Specific Question:   If indicated for the ordered procedure, I authorize the administration of contrast media per Radiology protocol    Answer:   Yes    Order Specific Question:   Reason for Exam (SYMPTOM  OR DIAGNOSIS REQUIRED)    Answer:   suprglottic ca, assess response to Rx    Order Specific Question:   Preferred imaging location?    Answer:   [facility]   All questions were answered. The patient knows to call the clinic with any problems, questions or concerns. No barriers to learning was detected. I spent 30 minutes counseling the patient face to face. The total time spent in the appointment was 40 minutes and more than 50% was on counseling and review of test results     [name], MD 11/29/2016 9:15 AM

## 2016-12-13 ENCOUNTER — Ambulatory Visit (HOSPITAL_COMMUNITY)
Admission: RE | Admit: 2016-12-13 | Discharge: 2016-12-13 | Disposition: A | Discharge status: Home / Self Care | Payer: Medicaid Other | Source: Ambulatory Visit | Attending: Hematology and Oncology | Admitting: Hematology and Oncology

## 2016-12-13 DIAGNOSIS — C321 Malignant neoplasm of supraglottis: Secondary | ICD-10-CM | POA: Diagnosis not present

## 2016-12-13 DIAGNOSIS — Z4682 Encounter for fitting and adjustment of non-vascular catheter: Secondary | ICD-10-CM | POA: Insufficient documentation

## 2016-12-13 MED ORDER — IOPAMIDOL (ISOVUE-300) INJECTION 61%
INTRAVENOUS | Status: AC
  Administered 2016-12-13: 75 mL
  Filled 2016-12-13: qty 75

## 2016-12-15 ENCOUNTER — Encounter: Payer: Self-pay | Admitting: Hematology and Oncology

## 2016-12-15 ENCOUNTER — Encounter: Payer: Self-pay | Admitting: *Deleted

## 2016-12-15 ENCOUNTER — Ambulatory Visit (HOSPITAL_BASED_OUTPATIENT_CLINIC_OR_DEPARTMENT_OTHER): Payer: Medicaid Other | Admitting: Hematology and Oncology

## 2016-12-15 VITALS — BP 146/77 | HR 113 | Temp 99.2°F | Resp 18 | Ht 70.0 in | Wt 169.7 lb

## 2016-12-15 DIAGNOSIS — Z720 Tobacco use: Secondary | ICD-10-CM | POA: Diagnosis not present

## 2016-12-15 DIAGNOSIS — C321 Malignant neoplasm of supraglottis: Secondary | ICD-10-CM

## 2016-12-15 DIAGNOSIS — Z66 Do not resuscitate: Secondary | ICD-10-CM | POA: Diagnosis not present

## 2016-12-15 DIAGNOSIS — J440 Chronic obstructive pulmonary disease with acute lower respiratory infection: Secondary | ICD-10-CM

## 2016-12-15 DIAGNOSIS — G893 Neoplasm related pain (acute) (chronic): Secondary | ICD-10-CM | POA: Diagnosis not present

## 2016-12-15 DIAGNOSIS — Z930 Tracheostomy status: Secondary | ICD-10-CM | POA: Diagnosis not present

## 2016-12-15 DIAGNOSIS — J209 Acute bronchitis, unspecified: Secondary | ICD-10-CM

## 2016-12-15 MED ORDER — AMOXICILLIN 250 MG/5ML PO SUSR: 500.0000 mg | Freq: Two times a day (BID) | ORAL | 0 refills | Status: DC

## 2016-12-15 MED ORDER — LIDOCAINE VISCOUS 2 % MT SOLN: 15.0000 mL | Freq: Three times a day (TID) | OROMUCOSAL | 0 refills | Status: DC | PRN

## 2016-12-15 MED ORDER — MORPHINE SULFATE 30 MG PO TABS: 30.0000 mg | ORAL_TABLET | ORAL | 0 refills | Status: DC | PRN

## 2016-12-15 MED FILL — MORPHINE SULFATE IR 30 MG T: 30 | 15 days supply | Qty: 90 | Fill #0

## 2016-12-15 MED FILL — AMOXICILLIN 250 MG/5 ML SUS: 250 | 7 days supply | Qty: 200 | Fill #0

## 2016-12-15 MED FILL — LIDOCAINE 2% VISCOUS SOLN: 2 | 3 days supply | Qty: 100 | Fill #0

## 2016-12-15 NOTE — Assessment & Plan Note (Signed)
We discussed goals of care  The patient is aware he has stage IV disease and treatment is strictly palliative. We discussed importance of Advanced Directives and Living will. I will get assistance from our social worker to help him fill out some paperwork. He appointed his ex-girl friend as his MPOA We discussed CODE STATUS; the patient desires to be DNR. He does not want life sustaining measures such as artificial nutritional support or hemodialysis

## 2016-12-15 NOTE — Assessment & Plan Note (Signed)
He has recurrent COPD exacerbation He cannot swallow pills I will change his prescription to liquid preparation I recommend he continues prednisone 10 mg daily I advised him to stop smoking

## 2016-12-15 NOTE — Progress Notes (Signed)
Patient on plan of care prior to pathways. 

## 2016-12-15 NOTE — Assessment & Plan Note (Signed)
The patient had mild bleeding from the tracheostomy site. Clinically, he has signs of acute bronchitis. I encouraged tracheostomy care and antibiotic therapy

## 2016-12-15 NOTE — Assessment & Plan Note (Signed)
I continue to encourage him to quit smoking. He wants to quit on his own

## 2016-12-15 NOTE — Assessment & Plan Note (Signed)
Unfortunately, CT imaging showed disease progression He has recent hemoptysis We discussed treatment options and review of guidelines We discussed the role of chemotherapy. The intent is for palliative.  We discussed some of the risks, benefits, side-effects of Paclitaxel.   Some of the short term side-effects included, though not limited to, risk of severe allergic reaction, fatigue, weight loss, pancytopenia, life-threatening infections, need for transfusions of blood products, nausea, vomiting, change in bowel habits, loss of hair, admission to hospital for various reasons, and risks of death.   Long term side-effects are also discussed including risks of infertility, permanent damage to nerve function, chronic fatigue, and rare secondary malignancy including bone marrow disorders.   The patient is aware that the response rates discussed earlier is not guaranteed.    After a long discussion, patient made an informed decision to proceed with the prescribed plan of care  I will schedule Rx to start 1/15 and see him on week 2 for toxicity review

## 2016-12-15 NOTE — Progress Notes (Signed)
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[place])   03/15/2015 - 03/17/2015 Hospital Admission    He was admitted to the hospital for evaluation of dysphagia, SOB, hemoptosis, hoarseness, 30-40 pound weight loss and worsening bilateral neck masses for 5 months      03/15/2015 Imaging    Ct showed extensive circumferential malignancy in the hypopharyngeal/supraglottic region with regional LN metastases      03/16/2015 Procedure    He underwent ULTRASOUND-GUIDED BIOPSY OF LEFT CERVICAL LYMPH NODES      03/16/2015 Pathology Results    Accession: [id number] LN biopsy showed invasive squamous cell cancer      03/16/2015 Pathology Results    Accession: [id number] showed atypical squamous cells      03/25/2015 - 04/07/2015 Hospital Admission    He was admitted to the hospital and underwent tracheostomy placement, feeeding tube placement but subsequently left [facility]      03/26/2015 Surgery    He had multiple extraction of tooth numbers 1, 2, 5, 6, 7, 8, 9, 10, 11, 12, 13, 18, 19, 21, 22, 23, 24, 25, 26, 27, 28, and 29. and 4 Quadrants of alveoloplasty      03/26/2015 Surgery    He underwent tracheostomy      03/31/2015 Surgery    He had open gastrostomy tube placement by Dr. Donne Hazel      04/16/2015 - 05/18/2015 Radiation Therapy    Laryngopharynx and bilateral neck / 50 Gy in 20 fractions to gross disease, 45 Gy in 20 fractions to high risk nodal echelons  Beams/energy: Helical IMRT / 6 MV photons      04/16/2015 Procedure    Fluoroscopic reposition of the 18 French gastrostomy confirmed back in the stomach,      07/03/2015 Procedure    IR performed  replacement of gastrostomy tube with a new 70 French balloon retention tube      10/01/2015 Imaging    PEt scan showed persistent hypermetabolism within the primary supraglottic laryngeal tumor and within right retropharyngeal, bilateral level II and left level IV cervical nodal metastases      10/28/2015 Procedure    He had placement of PICC line. The IR was not able to place PORT due to suspected upper respiratory infection      11/13/2015 - 03/21/2016 Chemotherapy    He received 5FU, carboplatin chemo with weekly Erbitux      11/19/2015 Surgery    Gastrostomy tube replaced.      11/30/2015 Procedure    Placement of right jugular port-a-cath.      11/30/2015 Procedure    PICC removed.      12/30/2015 Imaging    PET CT showed positive response to Rx      02/26/2016 Procedure    He underwent direct laryngoscopy with biopsy. Esophageal dilatation.       02/26/2016 Pathology Results    Repeat biopsy of supraglottis showed persistent disease      02/29/2016 Procedure    Gastrostomy tube exchanged.      03/28/2016 PET scan    Hypermetabolic tissue in the posterior RIGHT hypopharynx is similar in pattern to PET-CT of 12/30/2015 but increased in metabolic activity.2. Hypermetabolic tissue /  lymph nodes in the LEFT supraclavicular nodal station are in a similar pattern       04/04/2016 - 11/29/2016 Chemotherapy    He started on palliative chemotherapy with pembrolizumab      05/03/2016 Imaging    MRI head is negative      06/02/2016 Imaging    Mild improvement of diffuse pharyngeal and supraglottic edema.Increased asymmetry of right-sided hypopharyngeal/supraglottic softtissue compared to the prior neck CT with FDG uptake in this region on prior PET-CT. Residual/recurrent tumor is possible      08/09/2016 Procedure    IR placed new 20 French percutaneous gastrostomy tube.      08/26/2016 Imaging    Ct neck showed unchanged diffuse pharyngeal and supraglottic edema as well  as asymmetric soft tissue thickening on the right. Slightly decreased size of some left level II lymph nodes. Unchanged lymphadenopathy elsewhere in the neck. Decreased size of thyroid mass. No evidence of metastatic cancer to the chest      12/13/2016 Imaging    Ct chest showed no evidence of thoracic metastatic disease or primary thoracic malignancy.      12/13/2016 Imaging    Ct neck showed progression of of RIGHT supraglottic mass compared with priors, approximate size 24 x 27 x 27 mm. Extension caudally along the RIGHT area epiglottic fold with increasing mass effect on the airway. Tracheostomy satisfactory position. Stable to slightly improved malignant adenopathy.       INTERVAL HISTORY: Please see below for problem oriented charting. He has recent COPD exacerbation with cough, productive sputum and recent hemoptysis He has worsening throat pain His appetite is stable No recent weight loss He continues to smoke Denies recent neuropathy  REVIEW OF SYSTEMS:   Constitutional: Denies fevers, chills or abnormal weight loss Eyes: Denies blurriness of vision Cardiovascular: Denies palpitation, chest discomfort or lower extremity swelling Gastrointestinal:  Denies nausea, heartburn or change in bowel habits Skin: Denies abnormal skin rashes Lymphatics: Denies new lymphadenopathy or easy bruising Neurological:Denies numbness, tingling or new weaknesses Behavioral/Psych: Mood is stable, no new changes  All other systems were reviewed with the patient and are negative.  I have reviewed the past medical history, past surgical history, social history and family history with the patient and they are unchanged from previous note.  ALLERGIES:  is allergic to aspirin.  MEDICATIONS:  Current Outpatient Prescriptions  Medication Sig Dispense Refill  . fluticasone (FLONASE) 50 MCG/ACT nasal spray Place 2 sprays into both nostrils daily. 16 g 2  . lansoprazole (PREVACID) 30 MG capsule Take 1  capsule (30 mg total) by mouth 2 (two) times daily before a meal. 60 capsule 6  . levothyroxine (SYNTHROID, LEVOTHROID) 50 MCG tablet Take 1 tablet (50 mcg total) by mouth daily before breakfast. 30 tablet 9  . morphine (MSIR) 30 MG tablet Take 1 tablet (30 mg total) by mouth every 4 (four) hours as needed for severe pain. 90 tablet 0  . Nutritional Supplements (FEEDING SUPPLEMENT, JEVITY 1.5 CAL/FIBER,) LIQD Give 1 can Osmolite 1.2 + 1 can of Jevity 1.5 QID via PEG with 60 cc free water before and after bolus. Flush with 240 cc free water BID between feedings. 948 mL   . SODIUM CHLORIDE, EXTERNAL, 0.9 % SOLN Use to clean around Trach and perform Trach care once daily and PRN 1000 mL 11  . zolpidem (AMBIEN) 5 MG tablet TAKE 1 TABLET BY MOUTH AT BEDTIME AS NEEDED FOR SLEEP 30 tablet 0  . amoxicillin (AMOXIL) 250 MG/5ML suspension Take  10 mLs (500 mg total) by mouth 2 (two) times daily. 150 mL 0  . amoxicillin-clavulanate (AUGMENTIN) 875-125 MG tablet Take 1 tablet by mouth 2 (two) times daily. (Patient not taking: Reported on 12/15/2016) 14 tablet 0  . guaiFENesin (ROBITUSSIN) 100 MG/5ML SOLN Take 5 mLs (100 mg total) by mouth every 4 (four) hours as needed for cough or to loosen phlegm. (Patient not taking: Reported on 12/15/2016) 473 mL 3  . lidocaine (XYLOCAINE) 2 % solution Use as directed 15 mLs in the mouth or throat every 8 (eight) hours as needed for mouth pain. 100 mL 0  . LORazepam (ATIVAN) 1 MG tablet Take 1 tablet (1 mg total) by mouth 2 (two) times daily as needed for anxiety (or nausea). (Patient not taking: Reported on 12/15/2016) 10 tablet 0  . metoCLOPramide (REGLAN) 5 MG/5ML solution Place 5 mLs (5 mg total) into feeding tube 4 (four) times daily -  before meals and at bedtime. (Patient not taking: Reported on 12/15/2016) 240 mL 1  . morphine (MS CONTIN) 30 MG 12 hr tablet Take 1 tablet (30 mg total) by mouth every 12 (twelve) hours. (Patient not taking: Reported on 12/15/2016) 60 tablet 0  .  omeprazole (PRILOSEC) 20 MG capsule Take 1 capsule (20 mg total) by mouth 2 (two) times daily. 60 capsule 0  . ondansetron (ZOFRAN) 8 MG tablet Take 1 tablet (8 mg total) by mouth every 8 (eight) hours as needed for nausea. (Patient not taking: Reported on 12/15/2016) 60 tablet 3  . polyethylene glycol (MIRALAX) packet Take 17 g by mouth daily. (Patient not taking: Reported on 12/15/2016) 14 each 0  . predniSONE (DELTASONE) 10 MG tablet Take 1 tablet (10 mg total) by mouth daily with breakfast. (Patient not taking: Reported on 12/15/2016) 30 tablet 9  . triamcinolone (NASACORT AQ) 55 MCG/ACT AERO nasal inhaler Place 2 sprays into the nose daily. (Patient not taking: Reported on 12/15/2016) 1 Inhaler 12   No current facility-administered medications for this visit.     PHYSICAL EXAMINATION: ECOG PERFORMANCE STATUS: 1 - Symptomatic but completely ambulatory  Vitals:   12/15/16 1522  BP: (!) 146/77  Pulse: (!) 113  Resp: 18  Temp: 99.2 F (37.3 C)   Filed Weights   12/15/16 1522  Weight: 169 lb 11.2 oz (77 kg)    GENERAL:alert, no distress and comfortable SKIN: skin color, texture, turgor are normal, no rashes or significant lesions EYES: normal, Conjunctiva are pink and non-injected, sclera clear OROPHARYNX:no exudate, no erythema and lips, buccal mucosa, and tongue normal  NECK: tracheostomy in situ LYMPH:  no palpable lymphadenopathy in the cervical, axillary or inguinal LUNGS: clear to auscultation and percussion with normal breathing effort HEART: regular rate & rhythm and no murmurs and no lower extremity edema ABDOMEN:abdomen soft, non-tender and normal bowel sounds Musculoskeletal:no cyanosis of digits and no clubbing  NEURO: alert & oriented x 3 with fluent speech, no focal motor/sensory deficits  LABORATORY DATA:  I have reviewed the data as listed    Component Value Date/Time   NA 140 11/29/2016 0815   K 3.5 11/29/2016 0815   CL 99 (L) 01/02/2016 1433   CO2 28 11/29/2016  0815   GLUCOSE 113 11/29/2016 0815   BUN 16.1 11/29/2016 0815   CREATININE 0.8 11/29/2016 0815   CALCIUM 9.9 11/29/2016 0815   PROT 7.6 11/29/2016 0815   ALBUMIN 3.8 11/29/2016 0815   AST 13 11/29/2016 0815   ALT 13 11/29/2016 0815   ALKPHOS 61 11/29/2016  0815   BILITOT 0.53 11/29/2016 0815   GFRNONAA >60 01/02/2016 1422   GFRAA >60 01/02/2016 1422    No results found for: SPEP, UPEP  Lab Results  Component Value Date   WBC 11.0 (H) 11/29/2016   NEUTROABS 9.2 (H) 11/29/2016   HGB 13.4 11/29/2016   HCT 40.3 11/29/2016   MCV 99.6 (H) 11/29/2016   PLT 226 11/29/2016      Chemistry      Component Value Date/Time   NA 140 11/29/2016 0815   K 3.5 11/29/2016 0815   CL 99 (L) 01/02/2016 1433   CO2 28 11/29/2016 0815   BUN 16.1 11/29/2016 0815   CREATININE 0.8 11/29/2016 0815      Component Value Date/Time   CALCIUM 9.9 11/29/2016 0815   ALKPHOS 61 11/29/2016 0815   AST 13 11/29/2016 0815   ALT 13 11/29/2016 0815   BILITOT 0.53 11/29/2016 0815       RADIOGRAPHIC STUDIES:I reviewed the imaging with the patient I have personally reviewed the radiological images as listed and agreed with the findings in the report. Ct Soft Tissue Neck W Contrast  Result Date: 12/13/2016 CLINICAL DATA:  Restaging supraglottic cancer. Status post radiation. Ongoing chemotherapy. Patient continues to smoke. EXAM: CT NECK WITH CONTRAST TECHNIQUE: Multidetector CT imaging of the neck was performed using the standard protocol following the bolus administration of intravenous contrast. CONTRAST:  41mL ISOVUE-300 IOPAMIDOL (ISOVUE-300) INJECTION 61% COMPARISON:  08/26/2016. FINDINGS: Pharynx and larynx: Diffuse pharyngeal and subglottic edema redemonstrated. Supraglottic mass approximately 24 x 27 x 27 mm shows increased mass effect on the airway compared previous study in September. Compare orthogonal axial image 50 series 20 with previous image 61 series 13. Increasing mass effect/tumor extends along  the RIGHT aryepiglottic fold to the level of the glottis, where mild increased narrowing of the airway is also observed when compared with priors. Unchanged in satisfactory tracheostomy tube placement. Salivary glands: No inflammation, mass, or stone. Post radiation changes. Thyroid: Continued involution/stability of previously noted LEFT thyroid mass, now 11 x 14 mm, compared 9 x 17 mm most recent Lymph nodes: RIGHT retropharyngeal malignant adenopathy, unchanged 13 mm short axis, reference image 25 series 20. Necrotic RIGHT level IIA lymph node, 11 mm short axis image 37 series 20, stable. Necrotic LEFT level IIB lymph node, image 51 series 20, 10 mm short axis unchanged. LEFT level IIA lymph nodes poorly visualized due to stranding, probably improved. Stable LEFT level IV lymph node or nodes, up to 8 mm short axis. Vascular: Atherosclerosis.  Grossly patent. Limited intracranial: Limited, negative Visualized orbits: Not visualized Mastoids and visualized paranasal sinuses: Grossly unremarkable. Skeleton: Spondylosis.  No destructive lesion.  Edentulous. Upper chest: Reported separately Other: None. IMPRESSION: Progression of of RIGHT supraglottic mass compared with priors, approximate size 24 x 27 x 27 mm. Extension caudally along the RIGHT area epiglottic fold with increasing mass effect on the airway. Tracheostomy satisfactory position. Stable to slightly improved malignant adenopathy. Electronically Signed   By: Staci Righter M.D.   On: 12/13/2016 10:04   Ct Chest W Contrast  Result Date: 12/13/2016 CLINICAL DATA:  Supraglottic carcinoma diagnosed in April 2016 with cervical metastatic lymphadenopathy. Restaging. Chemotherapy ongoing. EXAM: CT CHEST WITH CONTRAST TECHNIQUE: Multidetector CT imaging of the chest was performed during intravenous contrast administration. CONTRAST:  74mL ISOVUE-300 IOPAMIDOL (ISOVUE-300) INJECTION 61% COMPARISON:  CT 08/26/2016 and 06/02/2016. FINDINGS: This study was performed  in conjunction with CT of the neck, dictated separately. Cardiovascular: Right IJ Port-A-Cath tip  extends into the upper right atrium. There is mild aortic and great vessel atherosclerosis. No acute vascular findings are seen on noncontrast imaging. The heart size is normal. There is no pericardial effusion. Mediastinum/Nodes: There are no enlarged mediastinal, hilar or axillary lymph nodes.Hilar assessment is limited by the lack of intravenous contrast, although the hilar contours appear unchanged. Tracheostomy appears well positioned. Previously noted left thyroid nodule is not well visualized. Lungs/Pleura: There is no pleural effusion. Probable mild radiation changes at both lung apices. The lungs are otherwise clear. Upper abdomen: Probable small gallstones. The visualized upper abdomen otherwise appears unremarkable. Musculoskeletal/Chest wall: There is no chest wall mass or suspicious osseous finding. There are old left-sided rib fractures. IMPRESSION: 1. No evidence of thoracic metastatic disease or primary thoracic malignancy. 2. Neck CT findings dictated separately. Electronically Signed   By: Richardean Sale M.D.   On: 12/13/2016 09:48    ASSESSMENT & PLAN:  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[facility]) Unfortunately, CT imaging showed disease progression He has recent hemoptysis We discussed treatment options and review of guidelines We discussed the role of chemotherapy. The intent is for palliative.  We discussed some of the risks, benefits, side-effects of Paclitaxel.   Some of the short term side-effects included, though not limited to, risk of severe allergic reaction, fatigue, weight loss, pancytopenia, life-threatening infections, need for transfusions of blood products, nausea, vomiting, change in bowel habits, loss of hair, admission to hospital for various reasons, and risks of death.   Long term side-effects are also discussed including risks of infertility, permanent damage to  nerve function, chronic fatigue, and rare secondary malignancy including bone marrow disorders.   The patient is aware that the response rates discussed earlier is not guaranteed.    After a long discussion, patient made an informed decision to proceed with the prescribed plan of care  I will schedule Rx to start 1/15 and see him on week 2 for toxicity review  Cancer associated pain This is overall stable He complained MS Contin caued too much sedation He can continue to take MS Contin at night and IR morphine as needed for pain during day time I refilled his prescriptions today  Acute bronchitis with chronic obstructive pulmonary disease (COPD) ([place]) He has recurrent COPD exacerbation He cannot swallow pills I will change his prescription to liquid preparation I recommend he continues prednisone 10 mg daily I advised him to stop smoking  Tobacco abuse I continue to encourage him to quit smoking. He wants to quit on his own  [facility]ache[facility] status [facility] The patient had mild bleeding from the tracheostomy site. Clinically, he has signs of acute bronchitis. I encouraged tracheostomy care and antibiotic therapy  DNR (do not resuscitate) We discussed goals of care  The patient is aware he has stage IV disease and treatment is strictly palliative. We discussed importance of Advanced Directives and Living will. I will get assistance from our social worker to help him fill out some paperwork. He appointed his ex-girl friend as his MPOA We discussed CODE STATUS; the patient desires to be DNR. He does not want life sustaining measures such as artificial nutritional support or hemodialysis   Orders Placed This Encounter  Procedures  . CBC with Differential    Standing Status:   Standing    Number of Occurrences:   20    Standing Expiration Date:   12/16/2017  . Comprehensive metabolic panel    Standing Status:   Standing    Number of Occurrences:  20    Standing Expiration Date:    12/16/2017   All questions were answered. The patient knows to call the clinic with any problems, questions or concerns. No barriers to learning was detected. I spent 41 minutes counseling the patient face to face. The total time spent in the appointment was 72 minutes and more than 50% was on counseling and review of test results     [name], MD 12/15/2016 8:24 PM

## 2016-12-15 NOTE — Progress Notes (Signed)
START ON PATHWAY REGIMEN - Head and Neck  FC:[id number]: Paclitaxel 80 mg/m2 Weekly   Administer weekly:     Paclitaxel (Taxol(R)) 80 mg/m2 in 250 mL NS IV over 1 hour Dose Mod: None  **Always confirm dose/schedule in your pharmacy ordering system**    Patient Characteristics: Hypopharynx, Metastatic (Squamous Cell), Third Line Disease Classification: Hypopharynx AJCC Stage Grouping: IVC AJCC M Stage: X AJCC N Stage: X AJCC T Stage: X Current Disease Status: Distant Metastases Line of therapy: Third Line Would you be surprised if this patient died  in the next year? I would NOT be surprised if this patient died in the next year  Intent of Therapy: Non-Curative / Palliative Intent, Discussed with Patient

## 2016-12-15 NOTE — Assessment & Plan Note (Signed)
This is overall stable He complained MS Contin caued too much sedation He can continue to take MS Contin at night and IR morphine as needed for pain during day time I refilled his prescriptions today

## 2016-12-16 MED FILL — LEVOTHYROXINE 50 MCG TABLET: 50 | 30 days supply | Qty: 30 | Fill #4

## 2016-12-16 NOTE — Progress Notes (Signed)
Oncology Nurse Navigator Documentation  Met with Mr. Kyle Alexander during Established Patient appt with Dr. Alvy Bimler. He reported:  Instillation of 6-8 cans daily nutritional supplement daily via PEG .  R-sided neck pain.  "I know something's not right." He voiced understanding of Dr. Calton Dach discussion of:  1/2 CTs, evidence of recurrent supraglottic mass.  Chemotherapy options as 3rd line treatment, ca 20% chance of benefit.  He opted for weekly paclitaxel, understands to start week of 1/15. He denied any needs or concerns at this time, I encouraged him to contact me if that changes, he verbalized understanding.  Gayleen Orem, RN, BSN, [place] [facility] Oncology Nurse [place] [facility] [place] [phone]

## 2016-12-19 MED FILL — SODIUM CHLORIDE 0.9% IRRIG.: 0.9 | 30 days supply | Qty: 1000 | Fill #2

## 2016-12-20 ENCOUNTER — Telehealth: Payer: Self-pay | Admitting: Hematology and Oncology

## 2016-12-20 NOTE — Telephone Encounter (Signed)
I spoke with the patient and confirmed the 1/15 appointments

## 2016-12-26 ENCOUNTER — Other Ambulatory Visit (HOSPITAL_BASED_OUTPATIENT_CLINIC_OR_DEPARTMENT_OTHER): Payer: Medicaid Other

## 2016-12-26 ENCOUNTER — Ambulatory Visit (HOSPITAL_BASED_OUTPATIENT_CLINIC_OR_DEPARTMENT_OTHER): Payer: Medicaid Other

## 2016-12-26 ENCOUNTER — Ambulatory Visit: Payer: Medicaid Other

## 2016-12-26 ENCOUNTER — Other Ambulatory Visit: Payer: Self-pay | Admitting: Hematology and Oncology

## 2016-12-26 ENCOUNTER — Ambulatory Visit: Payer: Medicaid Other | Admitting: Nutrition

## 2016-12-26 VITALS — BP 122/82 | HR 75 | Temp 98.6°F | Resp 20

## 2016-12-26 DIAGNOSIS — C321 Malignant neoplasm of supraglottis: Secondary | ICD-10-CM | POA: Diagnosis present

## 2016-12-26 DIAGNOSIS — Z79899 Other long term (current) drug therapy: Secondary | ICD-10-CM

## 2016-12-26 DIAGNOSIS — R5383 Other fatigue: Secondary | ICD-10-CM

## 2016-12-26 DIAGNOSIS — Z5111 Encounter for antineoplastic chemotherapy: Secondary | ICD-10-CM | POA: Diagnosis not present

## 2016-12-26 LAB — COMPREHENSIVE METABOLIC PANEL
ALT: 17 U/L (ref 0–55)
ANION GAP: 9 meq/L (ref 3–11)
AST: 16 U/L (ref 5–34)
Albumin: 3.6 g/dL (ref 3.5–5.0)
Alkaline Phosphatase: 53 U/L (ref 40–150)
BILIRUBIN TOTAL: 0.34 mg/dL (ref 0.20–1.20)
BUN: 14.9 mg/dL (ref 7.0–26.0)
CO2: 30 meq/L — AB (ref 22–29)
CREATININE: 0.7 mg/dL (ref 0.7–1.3)
Calcium: 9.8 mg/dL (ref 8.4–10.4)
Chloride: 103 mEq/L (ref 98–109)
EGFR: >90 mL/min/{1.73_m2} (ref >90)
Glucose: 115 mg/dl (ref 70–140)
Potassium: 4.1 mEq/L (ref 3.5–5.1)
Sodium: 141 mEq/L (ref 136–145)
Total Protein: 7 g/dL (ref 6.4–8.3)

## 2016-12-26 LAB — CBC WITH DIFFERENTIAL/PLATELET
BASO%: 0.1 % (ref 0.0–2.0)
Basophils Absolute: 0 10*3/uL (ref 0.0–0.1)
EOS%: 0.5 % (ref 0.0–7.0)
Eosinophils Absolute: 0.1 10*3/uL (ref 0.0–0.5)
HCT: 38.1 % — ABNORMAL LOW (ref 38.4–49.9)
HGB: 12.9 g/dL — ABNORMAL LOW (ref 13.0–17.1)
LYMPH#: 0.4 10*3/uL — AB (ref 0.9–3.3)
LYMPH%: 4.2 % — AB (ref 14.0–49.0)
MCH: 33.9 pg — ABNORMAL HIGH (ref 27.2–33.4)
MCHC: 33.9 g/dL (ref 32.0–36.0)
MCV: 100.1 fL — ABNORMAL HIGH (ref 79.3–98.0)
MONO#: 0.8 10*3/uL (ref 0.1–0.9)
MONO%: 7.6 % (ref 0.0–14.0)
NEUT%: 87.6 % — ABNORMAL HIGH (ref 39.0–75.0)
NEUTROS ABS: 9 10*3/uL — AB (ref 1.5–6.5)
PLATELETS: 186 10*3/uL (ref 140–400)
RBC: 3.81 10*6/uL — AB (ref 4.20–5.82)
RDW: 15 % — ABNORMAL HIGH (ref 11.0–14.6)
WBC: 10.3 10*3/uL (ref 4.0–10.3)

## 2016-12-26 LAB — TSH: TSH: 2.973 m(IU)/L (ref 0.320–4.118)

## 2016-12-26 LAB — TECHNOLOGIST REVIEW

## 2016-12-26 MED ORDER — FAMOTIDINE IN NACL 20-0.9 MG/50ML-% IV SOLN
INTRAVENOUS | Status: AC
  Filled 2016-12-26: qty 50

## 2016-12-26 MED ORDER — PACLITAXEL CHEMO INJECTION 300 MG/50ML
80.0000 mg/m2 | Freq: Once | INTRAVENOUS | Status: AC
  Administered 2016-12-26: 156 mg via INTRAVENOUS
  Filled 2016-12-26: qty 26

## 2016-12-26 MED ORDER — PACLITAXEL CHEMO INJECTION 300 MG/50ML
80.0000 mg/m2 | Freq: Once | INTRAVENOUS | Status: DC
  Filled 2016-12-26: qty 26

## 2016-12-26 MED ORDER — DIPHENHYDRAMINE HCL 50 MG/ML IJ SOLN
50.0000 mg | Freq: Once | INTRAMUSCULAR | Status: AC
  Administered 2016-12-26: 50 mg via INTRAVENOUS

## 2016-12-26 MED ORDER — SODIUM CHLORIDE 0.9 % IV SOLN
20.0000 mg | Freq: Once | INTRAVENOUS | Status: AC
  Administered 2016-12-26: 20 mg via INTRAVENOUS
  Filled 2016-12-26: qty 2

## 2016-12-26 MED ORDER — FAMOTIDINE IN NACL 20-0.9 MG/50ML-% IV SOLN
20.0000 mg | Freq: Once | INTRAVENOUS | Status: AC
  Administered 2016-12-26: 20 mg via INTRAVENOUS

## 2016-12-26 MED ORDER — SODIUM CHLORIDE 0.9 % IV SOLN
Freq: Once | INTRAVENOUS | Status: AC
  Administered 2016-12-26: 14:00:00 via INTRAVENOUS

## 2016-12-26 MED ORDER — DIPHENHYDRAMINE HCL 50 MG/ML IJ SOLN
INTRAMUSCULAR | Status: AC
  Filled 2016-12-26: qty 1

## 2016-12-26 MED ORDER — HYDROMORPHONE HCL 4 MG/ML IJ SOLN
2.0000 mg | INTRAMUSCULAR | Status: DC | PRN
  Administered 2016-12-26: 2 mg via INTRAVENOUS

## 2016-12-26 MED ORDER — MORPHINE SULFATE ER 30 MG PO TBCR: 30.0000 mg | EXTENDED_RELEASE_TABLET | Freq: Two times a day (BID) | ORAL | 0 refills | Status: DC

## 2016-12-26 MED ORDER — HYDROMORPHONE HCL 4 MG/ML IJ SOLN
INTRAMUSCULAR | Status: AC
  Filled 2016-12-26: qty 1

## 2016-12-26 MED ORDER — SODIUM CHLORIDE 0.9% FLUSH
10.0000 mL | INTRAVENOUS | Status: DC | PRN
Start: 2016-12-26 — End: 2018-01-03
  Administered 2016-12-26: 10 mL
  Filled 2016-12-26: qty 10

## 2016-12-26 MED ORDER — HEPARIN SOD (PORK) LOCK FLUSH 100 UNIT/ML IV SOLN
500.0000 [IU] | Freq: Once | INTRAVENOUS | Status: AC | PRN
  Administered 2016-12-26: 500 [IU]
  Filled 2016-12-26: qty 5

## 2016-12-26 MED ORDER — DEXAMETHASONE SODIUM PHOSPHATE 10 MG/ML IJ SOLN
INTRAMUSCULAR | Status: AC
  Filled 2016-12-26: qty 2

## 2016-12-26 NOTE — Patient Instructions (Signed)
[facility] Discharge Instructions for Patients Receiving Chemotherapy  Today you received the following chemotherapy agents:  Taxol  To help prevent nausea and vomiting after your treatment, we encourage you to take your nausea medication as prescribed.   If you develop nausea and vomiting that is not controlled by your nausea medication, call the clinic.   BELOW ARE SYMPTOMS THAT SHOULD BE REPORTED IMMEDIATELY:  *FEVER GREATER THAN 100.5 F  *CHILLS WITH OR WITHOUT FEVER  NAUSEA AND VOMITING THAT IS NOT CONTROLLED WITH YOUR NAUSEA MEDICATION  *UNUSUAL SHORTNESS OF BREATH  *UNUSUAL BRUISING OR BLEEDING  TENDERNESS IN MOUTH AND THROAT WITH OR WITHOUT PRESENCE OF ULCERS  *URINARY PROBLEMS  *BOWEL PROBLEMS  UNUSUAL RASH Items with * indicate a potential emergency and should be followed up as soon as possible.  Feel free to call the clinic you have any questions or concerns. The clinic phone number is [phone].  Please show the CHEMO ALERT CARD at check-in to the Emergency Department and triage nurse.   

## 2016-12-26 NOTE — Progress Notes (Signed)
Nutrition follow-up completed with patient during infusion for cancer of the supraglottis. Weight is relatively stable and was documented as 169 pounds. Patient is tolerating between 6 and 8 cans of Osmolite 1.5 and Jevity 1.5 daily. Reports he is expecting to have nausea with this chemotherapy and took nausea medication. Patient has no nutrition needs and expects to receive nutrition formula next week from his homecare agency.  Tube feeding providing 2560 cal, 113 g protein, 2220 mL free water.  Nutrition diagnosis: Unintentional weight loss.  Intervention: Patient will continue present tube feeding regimen for weight maintenance.  I will follow up with patient as needed.  **Disclaimer: This note was dictated with voice recognition software. Similar sounding words can inadvertently be transcribed and this note may contain transcription errors which may not have been corrected upon publication of note.**

## 2016-12-27 ENCOUNTER — Other Ambulatory Visit: Payer: Self-pay | Admitting: Hematology and Oncology

## 2016-12-27 ENCOUNTER — Emergency Department (HOSPITAL_COMMUNITY)
Admission: EM | Admit: 2016-12-27 | Discharge: 2016-12-27 | Disposition: A | Discharge status: Home / Self Care | Payer: Medicaid Other | Attending: Emergency Medicine | Admitting: Emergency Medicine

## 2016-12-27 ENCOUNTER — Telehealth: Payer: Self-pay | Admitting: *Deleted

## 2016-12-27 ENCOUNTER — Encounter (HOSPITAL_COMMUNITY): Payer: Self-pay

## 2016-12-27 DIAGNOSIS — E039 Hypothyroidism, unspecified: Secondary | ICD-10-CM | POA: Diagnosis not present

## 2016-12-27 DIAGNOSIS — R112 Nausea with vomiting, unspecified: Secondary | ICD-10-CM | POA: Insufficient documentation

## 2016-12-27 DIAGNOSIS — I10 Essential (primary) hypertension: Secondary | ICD-10-CM | POA: Diagnosis not present

## 2016-12-27 DIAGNOSIS — E860 Dehydration: Secondary | ICD-10-CM | POA: Insufficient documentation

## 2016-12-27 DIAGNOSIS — Z79899 Other long term (current) drug therapy: Secondary | ICD-10-CM | POA: Insufficient documentation

## 2016-12-27 DIAGNOSIS — F17210 Nicotine dependence, cigarettes, uncomplicated: Secondary | ICD-10-CM | POA: Insufficient documentation

## 2016-12-27 DIAGNOSIS — Z8589 Personal history of malignant neoplasm of other organs and systems: Secondary | ICD-10-CM | POA: Insufficient documentation

## 2016-12-27 LAB — CBC
HEMATOCRIT: 40.3 % (ref 39.0–52.0)
Hemoglobin: 13.8 g/dL (ref 13.0–17.0)
MCH: 33.5 pg (ref 26.0–34.0)
MCHC: 34.2 g/dL (ref 30.0–36.0)
MCV: 97.8 fL (ref 78.0–100.0)
Platelets: 199 10*3/uL (ref 150–400)
RBC: 4.12 MIL/uL — AB (ref 4.22–5.81)
RDW: 15 % (ref 11.5–15.5)
WBC: 22.1 10*3/uL — AB (ref 4.0–10.5)

## 2016-12-27 LAB — I-STAT CG4 LACTIC ACID, ED: Lactic Acid, Venous: 2.2 mmol/L (ref 0.5–1.9)

## 2016-12-27 LAB — LIPASE, BLOOD: Lipase: 17 U/L (ref 11–51)

## 2016-12-27 LAB — COMPREHENSIVE METABOLIC PANEL
ALT: 19 U/L (ref 17–63)
AST: 22 U/L (ref 15–41)
Albumin: 4.3 g/dL (ref 3.5–5.0)
Alkaline Phosphatase: 49 U/L (ref 38–126)
Anion gap: 11 (ref 5–15)
BUN: 19 mg/dL (ref 6–20)
CO2: 29 mmol/L (ref 22–32)
Calcium: 9.8 mg/dL (ref 8.9–10.3)
Chloride: 99 mmol/L — ABNORMAL LOW (ref 101–111)
Creatinine, Ser: 0.72 mg/dL (ref 0.61–1.24)
Glucose, Bld: 118 mg/dL — ABNORMAL HIGH (ref 65–99)
POTASSIUM: 3.6 mmol/L (ref 3.5–5.1)
Sodium: 139 mmol/L (ref 135–145)
Total Bilirubin: 0.4 mg/dL (ref 0.3–1.2)
Total Protein: 7.8 g/dL (ref 6.5–8.1)

## 2016-12-27 MED ORDER — ONDANSETRON HCL 4 MG/2ML IJ SOLN
4.0000 mg | Freq: Once | INTRAMUSCULAR | Status: AC | PRN
  Administered 2016-12-27: 4 mg via INTRAVENOUS
  Filled 2016-12-27: qty 2

## 2016-12-27 MED ORDER — LORAZEPAM 2 MG/ML IJ SOLN
1.0000 mg | Freq: Once | INTRAMUSCULAR | Status: AC
  Administered 2016-12-27: 1 mg via INTRAVENOUS
  Filled 2016-12-27: qty 1

## 2016-12-27 MED ORDER — SODIUM CHLORIDE 0.9 % IV BOLUS (SEPSIS)
1000.0000 mL | Freq: Once | INTRAVENOUS | Status: AC
  Administered 2016-12-27: 1000 mL via INTRAVENOUS

## 2016-12-27 MED ORDER — HEPARIN SOD (PORK) LOCK FLUSH 100 UNIT/ML IV SOLN
500.0000 [IU] | Freq: Once | INTRAVENOUS | Status: AC
  Administered 2016-12-27: 500 [IU]
  Filled 2016-12-27: qty 5

## 2016-12-27 MED FILL — MORPHINE SULF ER 30 MG TAB: 30 | 30 days supply | Qty: 60 | Fill #0

## 2016-12-27 NOTE — Telephone Encounter (Signed)
"  I'm getting ready to go to the ER.  Which one should I go to?  Why can't I just come to Triage?" Advised this office will close within the hour and the level of care he needs is beyond what we can do in the office.  No further questions.

## 2016-12-27 NOTE — ED Provider Notes (Signed)
[facility] DEPT Provider Note   CSN: [id number] Arrival date & time: 12/27/16  1644     History   Chief Complaint Chief Complaint  Patient presents with  . Emesis    HPI Kyle Alexander is a 56 y.o. male.  Patient is a 56 year old male with a history of laryngeal cancer status post laryngectomy who is currently on chemotherapy. Patient started a new chemotherapy regime yesterday through infusion. All day today he has had nausea and vomiting. He denies fever, upper respiratory symptoms or significant abdominal pain. He has tried taking Zofran and Ativan at home but has vomited them up.  He denies any shortness of breath.   The history is provided by the patient.  Emesis   This is a new problem. The current episode started 6 to 12 hours ago. The problem occurs continuously. The problem has not changed since onset.The emesis has an appearance of stomach contents (brown material). There has been no fever. Pertinent negatives include no abdominal pain, no chills, no cough, no diarrhea, no fever and no URI. Risk factors: started a new chemo infusion yesterday.    Past Medical History:  Diagnosis Date  . Cancer ([place]) 03/16/15   left cervical lymph node / throat (2016)  . Fatigue 03/28/2016  . GERD (gastroesophageal reflux disease)   . Heartburn symptom 10/19/2015  . Hypertension   . Insomnia 01/22/2016  . Nausea & vomiting 12/15/2015  . S/P radiation therapy 04/16/2015-05/18/2015   Laryngopharynx and bilateral neck / 50 Gy in 20 fractions to gross disease, 45 Gy in 20 fractions to high risk nodal echelons   . Seizures ([place])   . Shortness of breath dyspnea   . Syncopal episodes 04/25/2016    Patient Active Problem List   Diagnosis Date Noted  . DNR (do not resuscitate) 12/15/2016  . Acute bronchitis with chronic obstructive pulmonary disease (COPD) ([place]) 11/29/2016  . Acquired hypothyroidism 08/09/2016  . Back skin lesion 08/08/2016  . Syncopal episodes 04/25/2016  . Fatigue  03/28/2016  . 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 Cancer associated pain 02/19/2016  . Abscess of face 02/01/2016  . Insomnia 01/22/2016  . Pancytopenia due to antineoplastic chemotherapy ([place]) 01/22/2016  . Esophagitis, unspecified 01/22/2016  . Hemoptysis 01/22/2016  . Chemotherapy-induced nausea 01/01/2016  . Nausea & vomiting 12/15/2015  . Anemia due to antineoplastic chemotherapy 12/11/2015  . Acneiform rash 12/11/2015  . Nasal drainage 10/30/2015  . Gastrostomy tube in place ([place]) 10/19/2015  . Heartburn symptom 10/19/2015  . Tracheostomy status ([place])   . Hypomagnesemia   . Tracheostomy care ([place])   . 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 Vitamin D deficiency 03/17/2015  . Folate deficiency 03/16/2015  . Protein-calorie malnutrition, severe ([place]) 03/16/2015  . HTN (hypertension) 03/15/2015  . Tobacco abuse 03/15/2015  . Alcohol abuse 03/15/2015  . Alcohol withdrawal seizure ([place]) 03/15/2015  . Prolonged Q-T interval on ECG 03/15/2015  . Aortic dilatation ([place]) 03/15/2015  . Thyroid nodule 03/15/2015  . Lymphadenopathy 03/15/2015  . Hypokalemia 03/15/2015  . B12 deficiency 03/15/2015  . Marijuana abuse 03/15/2015    Past Surgical History:  Procedure Laterality Date  . DIRECT LARYNGOSCOPY WITH BOTOX INJECTION N/A 02/26/2016   Procedure: DIRECT LARYNGOSCOPY ESOPHAGOSCOPY DILATION  AND TRACH REPLACMENT;  Surgeon: [name], MD;  Location: [facility];  Service: ENT;  Laterality: N/A;  . ESOPHAGOSCOPY WITH DILITATION N/A 02/26/2016   Procedure: ESOPHAGOSCOPY WITH DILITATION, PHARNGEAL BIOPSY;  Surgeon:  [name], MD;  Location: [facility];  Service: ENT;  Laterality: N/A;  . GASTROSTOMY N/A 03/31/2015   Procedure: OPEN GASTROSTOMY TUBE PLACEMENT;  Surgeon: Rolm Bookbinder, MD;  Location: [facility];  Service: General;  Laterality: N/A;  . HERNIA REPAIR     right groin    . IR GENERIC HISTORICAL  08/09/2016   IR GASTRIC TUBE PERC CHG W/O IMG GUIDE 08/09/2016 Jacqulynn Cadet, MD [facility]-INTERV RAD  . [place]   Left  . LYMPH NODE BIOPSY Left 03/16/15   left cervical, consistent with squamous cell carcinoma  . MULTIPLE EXTRACTIONS WITH ALVEOLOPLASTY N/A 03/26/2015   Procedure: EXTRACTION OF TOOTH #'S 1,2,5,6,7,8,9,10,11,12,13,18,19,21,22,23,24,25,26,27,28,29  WITH AVELOPLASTY;  Surgeon: Lenn Cal, DDS;  Location: [facility];  Service: Oral Surgery;  Laterality: N/A;  . TONSILLECTOMY    . TONSILLECTOMY AND ADENOIDECTOMY    . TOOTH EXTRACTION  03/26/2015   Procedure: [place];  Surgeon: [name], MD;  Location: [facility];  Service: ENT;;  . TRACHEOSTOMY TUBE PLACEMENT N/A 03/26/2015   Procedure: TRACHEOSTOMY ;  Surgeon: [name], MD;  Location: [facility];  Service: ENT;  Laterality: N/A;       Home Medications    Prior to Admission medications   Medication Sig Start Date End Date Taking? Authorizing Provider  levothyroxine (SYNTHROID, LEVOTHROID) 50 MCG tablet Take 1 tablet (50 mcg total) by mouth daily before breakfast. 10/10/16  Yes [name], MD  LORazepam (ATIVAN) 1 MG tablet Take 1 tablet (1 mg total) by mouth 2 (two) times daily as needed for anxiety (or nausea). 11/29/16  Yes [name], MD  morphine (MS CONTIN) 30 MG 12 hr tablet Take 1 tablet (30 mg total) by mouth every 12 (twelve) hours. 12/26/16  Yes [name], MD  morphine (MSIR) 30 MG tablet Take 1 tablet (30 mg total) by mouth every 4 (four) hours as needed for severe pain. 12/15/16  Yes [name], MD  omeprazole (PRILOSEC) 20 MG capsule Take 1 capsule (20 mg total) by mouth 2 (two) times daily. 04/12/16  Yes [name], MD  ondansetron (ZOFRAN) 8 MG tablet Take 1 tablet (8 mg total) by mouth every 8 (eight) hours as needed for nausea. 07/19/16  Yes [name], MD  amoxicillin (AMOXIL) 250 MG/5ML suspension Take 10 mLs (500 mg total) by mouth 2 (two) times daily. Patient not taking: Reported on  12/27/2016 12/15/16   [name], MD  amoxicillin-clavulanate (AUGMENTIN) 875-125 MG tablet Take 1 tablet by mouth 2 (two) times daily. Patient not taking: Reported on 12/27/2016 11/29/16   [name], MD  fluticasone (FLONASE) 50 MCG/ACT nasal spray Place 2 sprays into both nostrils daily. 10/31/16   [name], MD  guaiFENesin (ROBITUSSIN) 100 MG/5ML SOLN Take 5 mLs (100 mg total) by mouth every 4 (four) hours as needed for cough or to loosen phlegm. Patient not taking: Reported on 12/27/2016 03/07/16   [name], MD  lansoprazole (PREVACID) 30 MG capsule Take 1 capsule (30 mg total) by mouth 2 (two) times daily before a meal. 04/13/16   [name], MD  lidocaine (XYLOCAINE) 2 % solution Use as directed 15 mLs in the mouth or throat every 8 (eight) hours as needed for mouth pain. 12/15/16   [name], MD  metoCLOPramide (REGLAN) 5 MG/5ML solution Place 5 mLs (5 mg total) into feeding tube 4 (four) times daily -  before meals and at bedtime. Patient not taking: Reported on 12/27/2016 03/07/16   [name], MD  Nutritional Supplements (FEEDING SUPPLEMENT, JEVITY 1.5 CAL/FIBER,) LIQD Give 1 can Osmolite 1.2 + 1 can of Jevity 1.5 QID via PEG with 60 cc free water before and after bolus. Flush with 240 cc free water BID between feedings. 03/21/16   [name], MD  polyethylene glycol [facility]) packet Take 17 g by mouth daily. Patient not taking: Reported on 12/27/2016 10/19/15   [name], MD  predniSONE (DELTASONE) 10 MG tablet Take 1 tablet (10 mg total) by mouth daily with breakfast. Patient not taking: Reported on 12/27/2016 08/08/16   [name], MD  SODIUM CHLORIDE, EXTERNAL, 0.9 % SOLN Use to clean around Trach and perform Trach care once daily and PRN 01/22/16   [name], MD  triamcinolone (NASACORT AQ) 55 MCG/ACT AERO nasal inhaler Place 2 sprays into the nose daily. Patient not taking: Reported on 12/27/2016 10/10/16   [name], MD  zolpidem (AMBIEN) 5 MG tablet TAKE 1 TABLET BY MOUTH AT BEDTIME AS  NEEDED FOR SLEEP 11/11/16   [name], MD    Family History Family History  Problem Relation Age of Onset  . Cancer Mother   . Diabetes Mother   . Diabetes Father   . Hypertension Father     Social History Social History  Substance Use Topics  . Smoking status: Current Every Day Smoker    Packs/day: 0.00    Years: 35.00    Types: Cigarettes  . Smokeless tobacco: Former Systems developer    Types: Chew     Comment: 2-4 cig per day  . Alcohol use 18.0 oz/week    30 Standard drinks or equivalent per week     Comment: occasional     Allergies   Aspirin   Review of Systems Review of Systems  Constitutional: Negative for chills and fever.  Respiratory: Negative for cough.   Gastrointestinal: Positive for vomiting. Negative for abdominal pain and diarrhea.  All other systems reviewed and are negative.    Physical Exam Updated Vital Signs BP 139/88 (BP Location: Left Arm)   Pulse 87   Temp 99 F (37.2 C) (Oral)   Resp 20   Ht 5\' 10"  (1.778 m)   Wt 169 lb (76.7 kg)   SpO2 100%   BMI 24.25 kg/m   Physical Exam  Constitutional: He is oriented to person, place, and time. He appears well-developed and well-nourished. No distress.  Persistently vomiting on exam  HENT:  Head: Normocephalic and atraumatic.  Mouth/Throat: Oropharynx is clear and moist.  Eyes: Conjunctivae and EOM are normal. Pupils are equal, round, and reactive to light.  Neck: Normal range of motion. Neck supple.  Trach present with Passy-Muir valve.  Area surrounding valve clean without erythema  Cardiovascular: Normal rate, regular rhythm and intact distal pulses.   No murmur heard. Pulmonary/Chest: Effort normal and breath sounds normal. No respiratory distress. He has no wheezes. He has no rales.  Port in right upper chest wall.    Abdominal: Soft. He exhibits no distension. There is no tenderness. There is no rebound and no guarding.  No reproducible abd pain.  Peg present in left abd  Musculoskeletal:  Normal range of motion. He exhibits no edema or tenderness.  Neurological: He is alert and oriented to person, place, and time.  Skin: Skin is warm and dry. No rash noted. No erythema.  Psychiatric: He has a normal mood and affect. His behavior is normal.  Nursing note and vitals reviewed.    ED Treatments / Results  Labs (all labs  ordered are listed, but only abnormal results are displayed) Labs Reviewed  COMPREHENSIVE METABOLIC PANEL - Abnormal; Notable for the following:       Result Value   Chloride 99 (*)    Glucose, Bld 118 (*)    All other components within normal limits  CBC - Abnormal; Notable for the following:    WBC 22.1 (*)    RBC 4.12 (*)    All other components within normal limits  I-STAT CG4 LACTIC ACID, ED - Abnormal; Notable for the following:    Lactic Acid, Venous 2.20 (*)    All other components within normal limits  LIPASE, BLOOD  URINALYSIS, ROUTINE W REFLEX MICROSCOPIC    EKG  EKG Interpretation None       Radiology No results found.  Procedures Procedures (including critical care time)  Medications Ordered in ED Medications  ondansetron (ZOFRAN) injection 4 mg (not administered)  sodium chloride 0.9 % bolus 1,000 mL (not administered)  LORazepam (ATIVAN) injection 1 mg (not administered)     Initial Impression / Assessment and Plan / ED Course  I have reviewed the triage vital signs and the nursing notes.  Pertinent labs & imaging results that were available during my care of the patient were reviewed by me and considered in my medical decision making (see chart for details).  Clinical Course    Patient is a 56 year old male with a history of laryngeal cancer who is currently on chemotherapy. He started a new chemotherapy regime yesterday via IV infusion. All day today he has had nausea and vomiting. He has attempted to take anti-medics at home but continues to vomit them up. He denies any fever or significant localized pain. His  abdomen is just sore from vomiting. He denies any shortness of breath or upper respiratory complaints. Low suspicion that this is related to infection, acute abdominal pathology or cardiac pathology. Feel most likely this is reaction to chemotherapy.  6:28 PM Labs significant for a mildly elevated lactate of 2.2 and a leukocytosis of 22,000 however that may be related to the recent chemotherapy. CMP and lipase within normal limits. Patient is feeling much better after IV fluids, Ativan and Zofran. We'll continue to hydrate.  Vomiting improved.  Pt requesting to go home Final Clinical Impressions(s) / ED Diagnoses   Final diagnoses:  Intractable vomiting with nausea, unspecified vomiting type  Dehydration    New Prescriptions New Prescriptions   No medications on file     Blanchie Dessert, MD 12/28/16 [phone]

## 2016-12-27 NOTE — Telephone Encounter (Signed)
Pt reports he has thrown up 7-8 times today. Cannot keep water down. Emesis is brown and red- "a lot of blood". ( Pt is difficult to understand on phone as he has a trach) Pt cannot come in until after 5:00 today, so I instructed him to go to the ED to be evaluated. He states he will go to ED.

## 2016-12-27 NOTE — ED Notes (Signed)
RT is going to assess and suction pt's trach

## 2016-12-27 NOTE — ED Triage Notes (Signed)
Patient reports that he had chemo for stage 4 throat cancer and began vomiting today.

## 2016-12-27 NOTE — ED Notes (Signed)
RT at bedside.

## 2017-01-02 ENCOUNTER — Other Ambulatory Visit (HOSPITAL_BASED_OUTPATIENT_CLINIC_OR_DEPARTMENT_OTHER): Payer: Medicaid Other

## 2017-01-02 ENCOUNTER — Ambulatory Visit: Payer: Medicaid Other

## 2017-01-02 ENCOUNTER — Ambulatory Visit (HOSPITAL_BASED_OUTPATIENT_CLINIC_OR_DEPARTMENT_OTHER): Payer: Medicaid Other | Admitting: Hematology and Oncology

## 2017-01-02 ENCOUNTER — Ambulatory Visit: Payer: Medicaid Other | Admitting: Nutrition

## 2017-01-02 ENCOUNTER — Encounter: Payer: Self-pay | Admitting: Hematology and Oncology

## 2017-01-02 ENCOUNTER — Ambulatory Visit (HOSPITAL_BASED_OUTPATIENT_CLINIC_OR_DEPARTMENT_OTHER): Payer: Medicaid Other

## 2017-01-02 VITALS — BP 133/87 | HR 81 | Temp 98.7°F | Resp 18

## 2017-01-02 DIAGNOSIS — C770 Secondary and unspecified malignant neoplasm of lymph nodes of head, face and neck: Secondary | ICD-10-CM | POA: Diagnosis not present

## 2017-01-02 DIAGNOSIS — Z430 Encounter for attention to tracheostomy: Secondary | ICD-10-CM | POA: Diagnosis not present

## 2017-01-02 DIAGNOSIS — J440 Chronic obstructive pulmonary disease with acute lower respiratory infection: Secondary | ICD-10-CM | POA: Diagnosis not present

## 2017-01-02 DIAGNOSIS — R5383 Other fatigue: Secondary | ICD-10-CM

## 2017-01-02 DIAGNOSIS — G893 Neoplasm related pain (acute) (chronic): Secondary | ICD-10-CM | POA: Diagnosis not present

## 2017-01-02 DIAGNOSIS — Z79899 Other long term (current) drug therapy: Secondary | ICD-10-CM | POA: Diagnosis not present

## 2017-01-02 DIAGNOSIS — J209 Acute bronchitis, unspecified: Secondary | ICD-10-CM

## 2017-01-02 DIAGNOSIS — C321 Malignant neoplasm of supraglottis: Secondary | ICD-10-CM

## 2017-01-02 DIAGNOSIS — R110 Nausea: Secondary | ICD-10-CM | POA: Diagnosis not present

## 2017-01-02 DIAGNOSIS — Z5111 Encounter for antineoplastic chemotherapy: Secondary | ICD-10-CM

## 2017-01-02 DIAGNOSIS — T451X5A Adverse effect of antineoplastic and immunosuppressive drugs, initial encounter: Secondary | ICD-10-CM

## 2017-01-02 LAB — COMPREHENSIVE METABOLIC PANEL
ALT: 12 U/L (ref 0–55)
AST: 12 U/L (ref 5–34)
Albumin: 3.8 g/dL (ref 3.5–5.0)
Alkaline Phosphatase: 54 U/L (ref 40–150)
Anion Gap: 9 mEq/L (ref 3–11)
BILIRUBIN TOTAL: 0.67 mg/dL (ref 0.20–1.20)
BUN: 17.7 mg/dL (ref 7.0–26.0)
CO2: 28 meq/L (ref 22–29)
Calcium: 9.8 mg/dL (ref 8.4–10.4)
Chloride: 103 mEq/L (ref 98–109)
Creatinine: 0.8 mg/dL (ref 0.7–1.3)
GLUCOSE: 104 mg/dL (ref 70–140)
Potassium: 3.7 mEq/L (ref 3.5–5.1)
SODIUM: 139 meq/L (ref 136–145)
TOTAL PROTEIN: 7.2 g/dL (ref 6.4–8.3)

## 2017-01-02 LAB — CBC WITH DIFFERENTIAL/PLATELET
BASO%: 0.4 % (ref 0.0–2.0)
Basophils Absolute: 0 10*3/uL (ref 0.0–0.1)
EOS%: 0.6 % (ref 0.0–7.0)
Eosinophils Absolute: 0.1 10*3/uL (ref 0.0–0.5)
HCT: 38.3 % — ABNORMAL LOW (ref 38.4–49.9)
HGB: 13 g/dL (ref 13.0–17.1)
LYMPH%: 4.8 % — AB (ref 14.0–49.0)
MCH: 33.9 pg — ABNORMAL HIGH (ref 27.2–33.4)
MCHC: 33.9 g/dL (ref 32.0–36.0)
MCV: 100 fL — AB (ref 79.3–98.0)
MONO#: 0.5 10*3/uL (ref 0.1–0.9)
MONO%: 5.1 % (ref 0.0–14.0)
NEUT%: 89.1 % — ABNORMAL HIGH (ref 39.0–75.0)
NEUTROS ABS: 8.1 10*3/uL — AB (ref 1.5–6.5)
Platelets: 213 10*3/uL (ref 140–400)
RBC: 3.83 10*6/uL — AB (ref 4.20–5.82)
RDW: 14.8 % — ABNORMAL HIGH (ref 11.0–14.6)
WBC: 9.1 10*3/uL (ref 4.0–10.3)
lymph#: 0.4 10*3/uL — ABNORMAL LOW (ref 0.9–3.3)

## 2017-01-02 LAB — TECHNOLOGIST REVIEW

## 2017-01-02 LAB — TSH: TSH: 2.449 m[IU]/L (ref 0.320–4.118)

## 2017-01-02 MED ORDER — DIPHENHYDRAMINE HCL 50 MG/ML IJ SOLN
INTRAMUSCULAR | Status: AC
  Filled 2017-01-02: qty 1

## 2017-01-02 MED ORDER — HYDROMORPHONE HCL 4 MG/ML IJ SOLN
2.0000 mg | INTRAMUSCULAR | Status: DC | PRN
  Administered 2017-01-02: 2 mg via INTRAVENOUS

## 2017-01-02 MED ORDER — SODIUM CHLORIDE 0.9 % IV SOLN
20.0000 mg | Freq: Once | INTRAVENOUS | Status: AC
  Administered 2017-01-02: 20 mg via INTRAVENOUS
  Filled 2017-01-02: qty 2

## 2017-01-02 MED ORDER — DIPHENHYDRAMINE HCL 50 MG/ML IJ SOLN
50.0000 mg | Freq: Once | INTRAMUSCULAR | Status: AC
  Administered 2017-01-02: 50 mg via INTRAVENOUS

## 2017-01-02 MED ORDER — HYDROMORPHONE HCL 4 MG/ML IJ SOLN
INTRAMUSCULAR | Status: AC
  Filled 2017-01-02: qty 1

## 2017-01-02 MED ORDER — SODIUM CHLORIDE 0.9% FLUSH
10.0000 mL | INTRAVENOUS | Status: DC | PRN
  Administered 2017-01-02: 10 mL
  Filled 2017-01-02: qty 10

## 2017-01-02 MED ORDER — FAMOTIDINE IN NACL 20-0.9 MG/50ML-% IV SOLN
20.0000 mg | Freq: Once | INTRAVENOUS | Status: AC
  Administered 2017-01-02: 20 mg via INTRAVENOUS

## 2017-01-02 MED ORDER — SODIUM CHLORIDE 0.9 % IJ SOLN
10.0000 mL | INTRAMUSCULAR | Status: DC | PRN
  Administered 2017-01-02: 10 mL
  Filled 2017-01-02: qty 10

## 2017-01-02 MED ORDER — FAMOTIDINE IN NACL 20-0.9 MG/50ML-% IV SOLN
INTRAVENOUS | Status: AC
Start: 2017-01-02 — End: 2017-01-02
  Filled 2017-01-02: qty 50

## 2017-01-02 MED ORDER — PALONOSETRON HCL INJECTION 0.25 MG/5ML
0.2500 mg | Freq: Once | INTRAVENOUS | Status: AC
  Administered 2017-01-02: 0.25 mg via INTRAVENOUS

## 2017-01-02 MED ORDER — PACLITAXEL CHEMO INJECTION 300 MG/50ML
80.0000 mg/m2 | Freq: Once | INTRAVENOUS | Status: AC
  Administered 2017-01-02: 156 mg via INTRAVENOUS
  Filled 2017-01-02: qty 26

## 2017-01-02 MED ORDER — SODIUM CHLORIDE 0.9 % IV SOLN
Freq: Once | INTRAVENOUS | Status: AC
  Administered 2017-01-02: 12:00:00 via INTRAVENOUS

## 2017-01-02 MED ORDER — PALONOSETRON HCL INJECTION 0.25 MG/5ML
INTRAVENOUS | Status: AC
  Filled 2017-01-02: qty 5

## 2017-01-02 MED ORDER — SODIUM CHLORIDE 0.9 % IV SOLN: Freq: Once | INTRAVENOUS | Status: DC

## 2017-01-02 MED ORDER — HEPARIN SOD (PORK) LOCK FLUSH 100 UNIT/ML IV SOLN
500.0000 [IU] | Freq: Once | INTRAVENOUS | Status: AC | PRN
  Administered 2017-01-02: 500 [IU]
  Filled 2017-01-02: qty 5

## 2017-01-02 MED FILL — OMEPRAZOLE 20 MG CAPSULE DR: 20 | 30 days supply | Qty: 60 | Fill #1

## 2017-01-02 NOTE — Assessment & Plan Note (Signed)
Chest examination is stable He will continue low-dose prednisone The patient has finally quit smoking

## 2017-01-02 NOTE — Progress Notes (Signed)
Patient requesting follow-up nutrition appointment.  I was able to go see patient today during infusion for cancer of the supraglottis.   Patient was going to request a bolus TF but now declines because he is leaving within the hour and will give himself a feeding at home.  Weight decreased and documented as 166.4 pounds January 22 decreased from 169 pounds January 16. Patient was in the emergency department for nausea and vomiting on the 16th. M.D. added Aloxi to chemotherapy regimen to improve N/V.Marland Kitchen He will receive IV fluids tomorrow. I will continue to follow patient as needed.  **Disclaimer: This note was dictated with voice recognition software. Similar sounding words can inadvertently be transcribed and this note may contain transcription errors which may not have been corrected upon publication of note.**

## 2017-01-02 NOTE — Progress Notes (Signed)
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[place])   03/15/2015 - 03/17/2015 Hospital Admission    He was admitted to the hospital for evaluation of dysphagia, SOB, hemoptosis, hoarseness, 30-40 pound weight loss and worsening bilateral neck masses for 5 months      03/15/2015 Imaging    Ct showed extensive circumferential malignancy in the hypopharyngeal/supraglottic region with regional LN metastases      03/16/2015 Procedure    He underwent ULTRASOUND-GUIDED BIOPSY OF LEFT CERVICAL LYMPH NODES      03/16/2015 Pathology Results    Accession: [id number] LN biopsy showed invasive squamous cell cancer      03/16/2015 Pathology Results    Accession: [id number] showed atypical squamous cells      03/25/2015 - 04/07/2015 Hospital Admission    He was admitted to the hospital and underwent tracheostomy placement, feeeding tube placement but subsequently left [facility]      03/26/2015 Surgery    He had multiple extraction of tooth numbers 1, 2, 5, 6, 7, 8, 9, 10, 11, 12, 13, 18, 19, 21, 22, 23, 24, 25, 26, 27, 28, and 29. and 4 Quadrants of alveoloplasty      03/26/2015 Surgery    He underwent tracheostomy      03/31/2015 Surgery    He had open gastrostomy tube placement by Dr. Donne Hazel      04/16/2015 - 05/18/2015 Radiation Therapy    Laryngopharynx and bilateral neck / 50 Gy in 20 fractions to gross disease, 45 Gy in 20 fractions to high risk nodal echelons  Beams/energy: Helical IMRT / 6 MV photons      04/16/2015 Procedure    Fluoroscopic reposition of the 18 French gastrostomy confirmed back in the stomach,      07/03/2015 Procedure    IR performed  replacement of gastrostomy tube with a new 70 French balloon retention tube      10/01/2015 Imaging    PEt scan showed persistent hypermetabolism within the primary supraglottic laryngeal tumor and within right retropharyngeal, bilateral level II and left level IV cervical nodal metastases      10/28/2015 Procedure    He had placement of PICC line. The IR was not able to place PORT due to suspected upper respiratory infection      11/13/2015 - 03/21/2016 Chemotherapy    He received 5FU, carboplatin chemo with weekly Erbitux      11/19/2015 Surgery    Gastrostomy tube replaced.      11/30/2015 Procedure    Placement of right jugular port-a-cath.      11/30/2015 Procedure    PICC removed.      12/30/2015 Imaging    PET CT showed positive response to Rx      02/26/2016 Procedure    He underwent direct laryngoscopy with biopsy. Esophageal dilatation.       02/26/2016 Pathology Results    Repeat biopsy of supraglottis showed persistent disease      02/29/2016 Procedure    Gastrostomy tube exchanged.      03/28/2016 PET scan    Hypermetabolic tissue in the posterior RIGHT hypopharynx is similar in pattern to PET-CT of 12/30/2015 but increased in metabolic activity.2. Hypermetabolic tissue /  lymph nodes in the LEFT supraclavicular nodal station are in a similar pattern       04/04/2016 - 11/29/2016 Chemotherapy    He started on palliative chemotherapy with pembrolizumab      05/03/2016 Imaging    MRI head is negative      06/02/2016 Imaging    Mild improvement of diffuse pharyngeal and supraglottic edema.Increased asymmetry of right-sided hypopharyngeal/supraglottic softtissue compared to the prior neck CT with FDG uptake in this region on prior PET-CT. Residual/recurrent tumor is possible      08/09/2016 Procedure    IR placed new 20 French percutaneous gastrostomy tube.      08/26/2016 Imaging    Ct neck showed unchanged diffuse pharyngeal and supraglottic edema as well  as asymmetric soft tissue thickening on the right. Slightly decreased size of some left level II lymph nodes. Unchanged lymphadenopathy elsewhere in the neck. Decreased size of thyroid mass. No evidence of metastatic cancer to the chest      12/13/2016 Imaging    Ct chest showed no evidence of thoracic metastatic disease or primary thoracic malignancy.      12/13/2016 Imaging    Ct neck showed progression of of RIGHT supraglottic mass compared with priors, approximate size 24 x 27 x 27 mm. Extension caudally along the RIGHT area epiglottic fold with increasing mass effect on the airway. Tracheostomy satisfactory position. Stable to slightly improved malignant adenopathy.      12/26/2016 -  Chemotherapy    He received chemotherapy with weekly Taxol        INTERVAL HISTORY: Please see below for problem oriented charting. He is seen prior to cycle 2 of chemotherapy He had recent profound nausea and vomiting after cycle 1 of treatment He went to the emergency department due to bleeding from the tracheostomy site, resolved He expressed concerned that he may need IV fluid support tomorrow He denies recent diarrhea Denies peripheral neuropathy He has significant pain near the tracheostomy site, well controlled with current prescription pain medicine He denies recent constipation  REVIEW OF SYSTEMS:   Constitutional: Denies fevers, chills or abnormal weight loss Eyes: Denies blurriness of vision Ears, nose, mouth, throat, and face: Denies mucositis  Respiratory: Denies cough, dyspnea or wheezes Cardiovascular: Denies palpitation, chest discomfort or lower extremity swelling Skin: Denies abnormal skin rashes Lymphatics: Denies new lymphadenopathy or easy bruising Neurological:Denies numbness, tingling or new weaknesses Behavioral/Psych: Mood is stable, no new changes  All other systems were reviewed with the patient and are negative.  I have reviewed the past medical history, past surgical  history, social history and family history with the patient and they are unchanged from previous note.  ALLERGIES:  is allergic to aspirin.  MEDICATIONS:  Current Outpatient Prescriptions  Medication Sig Dispense Refill  . fluticasone (FLONASE) 50 MCG/ACT nasal spray Place 2 sprays into both nostrils daily. 16 g 2  . guaiFENesin (ROBITUSSIN) 100 MG/5ML SOLN Take 5 mLs (100 mg total) by mouth every 4 (four) hours as needed for cough or to loosen phlegm. 473 mL 3  . levothyroxine (SYNTHROID, LEVOTHROID) 50 MCG tablet Take 1 tablet (50 mcg total) by mouth daily before breakfast. 30 tablet 9  . lidocaine (XYLOCAINE) 2 % solution Use as directed 15 mLs in the mouth or throat every 8 (eight) hours as needed for mouth pain. 100 mL 0  . LORazepam (ATIVAN) 1 MG tablet Take 1 tablet (1 mg total) by mouth 2 (two) times daily as needed for anxiety (or nausea). 10 tablet  0  . morphine (MS CONTIN) 30 MG 12 hr tablet Take 1 tablet (30 mg total) by mouth every 12 (twelve) hours. 60 tablet 0  . morphine (MSIR) 30 MG tablet Take 1 tablet (30 mg total) by mouth every 4 (four) hours as needed for severe pain. 90 tablet 0  . Nutritional Supplements (FEEDING SUPPLEMENT, JEVITY 1.5 CAL/FIBER,) LIQD Give 1 can Osmolite 1.2 + 1 can of Jevity 1.5 QID via PEG with 60 cc free water before and after bolus. Flush with 240 cc free water BID between feedings. 948 mL   . omeprazole (PRILOSEC) 20 MG capsule Take 1 capsule (20 mg total) by mouth 2 (two) times daily. 60 capsule 0  . ondansetron (ZOFRAN) 8 MG tablet Take 1 tablet (8 mg total) by mouth every 8 (eight) hours as needed for nausea. 60 tablet 3  . polyethylene glycol (MIRALAX) packet Take 17 g by mouth daily. 14 each 0  . SODIUM CHLORIDE, EXTERNAL, 0.9 % SOLN Use to clean around Trach and perform Trach care once daily and PRN 1000 mL 11  . triamcinolone (NASACORT AQ) 55 MCG/ACT AERO nasal inhaler Place 2 sprays into the nose daily. 1 Inhaler 12   No current  facility-administered medications for this visit.    Facility-Administered Medications Ordered in Other Visits  Medication Dose Route Frequency Provider Last Rate Last Dose  . HYDROmorphone (DILAUDID) injection 2 mg  2 mg Intravenous Q2H PRN [name], MD   2 mg at 12/26/16 1440  . sodium chloride 0.9 % injection 10 mL  10 mL Intracatheter PRN [name], MD   10 mL at 01/02/17 1107  . sodium chloride flush (NS) 0.9 % injection 10 mL  10 mL Intracatheter PRN [name], MD   10 mL at 12/26/16 1708    PHYSICAL EXAMINATION: ECOG PERFORMANCE STATUS: 1 - Symptomatic but completely ambulatory  Vitals:   01/02/17 1128  BP: 110/85  Pulse: 80  Resp: 18  Temp: 99.5 F (37.5 C)   Filed Weights   01/02/17 1128  Weight: 166 lb 6.4 oz (75.5 kg)    GENERAL:alert, no distress and comfortable SKIN: skin color, texture, turgor are normal, no rashes or significant lesions EYES: normal, Conjunctiva are pink and non-injected, sclera clear OROPHARYNX:no exudate, no erythema and lips, buccal mucosa, and tongue normal  NECK: His tracheostomy site looks okay with no evidence of bleeding or infection LYMPH:  no palpable lymphadenopathy in the cervical, axillary or inguinal LUNGS: clear to auscultation and percussion with normal breathing effort HEART: regular rate & rhythm and no murmurs and no lower extremity edema ABDOMEN:abdomen soft, non-tender and normal bowel sounds Musculoskeletal:no cyanosis of digits and no clubbing  NEURO: alert & oriented x 3 with fluent speech, no focal motor/sensory deficits  LABORATORY DATA:  I have reviewed the data as listed    Component Value Date/Time   NA 139 01/02/2017 1033   K 3.7 01/02/2017 1033   CL 99 (L) 12/27/2016 1748   CO2 28 01/02/2017 1033   GLUCOSE 104 01/02/2017 1033   BUN 17.7 01/02/2017 1033   CREATININE 0.8 01/02/2017 1033   CALCIUM 9.8 01/02/2017 1033   PROT 7.2 01/02/2017 1033   ALBUMIN 3.8 01/02/2017 1033   AST 12 01/02/2017 1033    ALT 12 01/02/2017 1033   ALKPHOS 54 01/02/2017 1033   BILITOT 0.67 01/02/2017 1033   GFRNONAA >60 12/27/2016 1748   GFRAA >60 12/27/2016 1748    No results found for: SPEP, UPEP  Lab  Results  Component Value Date   WBC 9.1 01/02/2017   NEUTROABS 8.1 (H) 01/02/2017   HGB 13.0 01/02/2017   HCT 38.3 (L) 01/02/2017   MCV 100.0 (H) 01/02/2017   PLT 213 01/02/2017      Chemistry      Component Value Date/Time   NA 139 01/02/2017 1033   K 3.7 01/02/2017 1033   CL 99 (L) 12/27/2016 1748   CO2 28 01/02/2017 1033   BUN 17.7 01/02/2017 1033   CREATININE 0.8 01/02/2017 1033      Component Value Date/Time   CALCIUM 9.8 01/02/2017 1033   ALKPHOS 54 01/02/2017 1033   AST 12 01/02/2017 1033   ALT 12 01/02/2017 1033   BILITOT 0.67 01/02/2017 1033       RADIOGRAPHIC STUDIES: I have personally reviewed the radiological images as listed and agreed with the findings in the report. Ct Soft Tissue Neck W Contrast  Result Date: 12/13/2016 CLINICAL DATA:  Restaging supraglottic cancer. Status post radiation. Ongoing chemotherapy. Patient continues to smoke. EXAM: CT NECK WITH CONTRAST TECHNIQUE: Multidetector CT imaging of the neck was performed using the standard protocol following the bolus administration of intravenous contrast. CONTRAST:  42mL ISOVUE-300 IOPAMIDOL (ISOVUE-300) INJECTION 61% COMPARISON:  08/26/2016. FINDINGS: Pharynx and larynx: Diffuse pharyngeal and subglottic edema redemonstrated. Supraglottic mass approximately 24 x 27 x 27 mm shows increased mass effect on the airway compared previous study in September. Compare orthogonal axial image 50 series 20 with previous image 61 series 13. Increasing mass effect/tumor extends along the RIGHT aryepiglottic fold to the level of the glottis, where mild increased narrowing of the airway is also observed when compared with priors. Unchanged in satisfactory tracheostomy tube placement. Salivary glands: No inflammation, mass, or stone. Post  radiation changes. Thyroid: Continued involution/stability of previously noted LEFT thyroid mass, now 11 x 14 mm, compared 9 x 17 mm most recent Lymph nodes: RIGHT retropharyngeal malignant adenopathy, unchanged 13 mm short axis, reference image 25 series 20. Necrotic RIGHT level IIA lymph node, 11 mm short axis image 37 series 20, stable. Necrotic LEFT level IIB lymph node, image 51 series 20, 10 mm short axis unchanged. LEFT level IIA lymph nodes poorly visualized due to stranding, probably improved. Stable LEFT level IV lymph node or nodes, up to 8 mm short axis. Vascular: Atherosclerosis.  Grossly patent. Limited intracranial: Limited, negative Visualized orbits: Not visualized Mastoids and visualized paranasal sinuses: Grossly unremarkable. Skeleton: Spondylosis.  No destructive lesion.  Edentulous. Upper chest: Reported separately Other: None. IMPRESSION: Progression of of RIGHT supraglottic mass compared with priors, approximate size 24 x 27 x 27 mm. Extension caudally along the RIGHT area epiglottic fold with increasing mass effect on the airway. Tracheostomy satisfactory position. Stable to slightly improved malignant adenopathy. Electronically Signed   By: Staci Righter M.D.   On: 12/13/2016 10:04   Ct Chest W Contrast  Result Date: 12/13/2016 CLINICAL DATA:  Supraglottic carcinoma diagnosed in April 2016 with cervical metastatic lymphadenopathy. Restaging. Chemotherapy ongoing. EXAM: CT CHEST WITH CONTRAST TECHNIQUE: Multidetector CT imaging of the chest was performed during intravenous contrast administration. CONTRAST:  98mL ISOVUE-300 IOPAMIDOL (ISOVUE-300) INJECTION 61% COMPARISON:  CT 08/26/2016 and 06/02/2016. FINDINGS: This study was performed in conjunction with CT of the neck, dictated separately. Cardiovascular: Right IJ Port-A-Cath tip extends into the upper right atrium. There is mild aortic and great vessel atherosclerosis. No acute vascular findings are seen on noncontrast imaging. The  heart size is normal. There is no pericardial effusion. Mediastinum/Nodes: There are  no enlarged mediastinal, hilar or axillary lymph nodes.Hilar assessment is limited by the lack of intravenous contrast, although the hilar contours appear unchanged. Tracheostomy appears well positioned. Previously noted left thyroid nodule is not well visualized. Lungs/Pleura: There is no pleural effusion. Probable mild radiation changes at both lung apices. The lungs are otherwise clear. Upper abdomen: Probable small gallstones. The visualized upper abdomen otherwise appears unremarkable. Musculoskeletal/Chest wall: There is no chest wall mass or suspicious osseous finding. There are old left-sided rib fractures. IMPRESSION: 1. No evidence of thoracic metastatic disease or primary thoracic malignancy. 2. Neck CT findings dictated separately. Electronically Signed   By: Richardean Sale M.D.   On: 12/13/2016 09:48    ASSESSMENT & PLAN:  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[facility]) He tolerated cycle 1 of treatment poorly due to uncontrolled nausea and vomiting I will modify his anti-emetics by adding Aloxi We will also schedule IV fluids tomorrow for supportive care I plan to see him back again next week for further assessment  Cancer associated pain This is overall stable He complained MS Contin caued too much sedation He can continue to take MS Contin at night and IR morphine as needed for pain during day time His pain is stable  Acute bronchitis with chronic obstructive pulmonary disease (COPD) ([place]) Chest examination is stable He will continue low-dose prednisone The patient has finally quit smoking  Chemotherapy-induced nausea He had recent severe chemotherapy-induced nausea and vomiting I plan to modify his premedication by adding Aloxi  Tracheostomy care [facility]) The patient had mild, intermittent bleeding from the tracheostomy site. I encouraged tracheostomy care and continue conservative  management He had recently completed antibiotic therapy   No orders of the defined types were placed in this encounter.  All questions were answered. The patient knows to call the clinic with any problems, questions or concerns. No barriers to learning was detected. I spent 25 minutes counseling the patient face to face. The total time spent in the appointment was 40 minutes and more than 50% was on counseling and review of test results     [name], MD 01/02/2017 12:14 PM

## 2017-01-02 NOTE — Assessment & Plan Note (Signed)
He had recent severe chemotherapy-induced nausea and vomiting I plan to modify his premedication by adding Aloxi

## 2017-01-02 NOTE — Assessment & Plan Note (Signed)
The patient had mild, intermittent bleeding from the tracheostomy site. I encouraged tracheostomy care and continue conservative management He had recently completed antibiotic therapy

## 2017-01-02 NOTE — Assessment & Plan Note (Signed)
This is overall stable He complained MS Contin caued too much sedation He can continue to take MS Contin at night and IR morphine as needed for pain during day time His pain is stable

## 2017-01-02 NOTE — Patient Instructions (Signed)
[facility] Discharge Instructions for Patients Receiving Chemotherapy  Today you received the following chemotherapy agents  Taxol   To help prevent nausea and vomiting after your treatment, we encourage you to take your nausea medication as directed. No Zofran for 3 days. Take Compazine instead.    If you develop nausea and vomiting that is not controlled by your nausea medication, call the clinic.   BELOW ARE SYMPTOMS THAT SHOULD BE REPORTED IMMEDIATELY:  *FEVER GREATER THAN 100.5 F  *CHILLS WITH OR WITHOUT FEVER  NAUSEA AND VOMITING THAT IS NOT CONTROLLED WITH YOUR NAUSEA MEDICATION  *UNUSUAL SHORTNESS OF BREATH  *UNUSUAL BRUISING OR BLEEDING  TENDERNESS IN MOUTH AND THROAT WITH OR WITHOUT PRESENCE OF ULCERS  *URINARY PROBLEMS  *BOWEL PROBLEMS  UNUSUAL RASH Items with * indicate a potential emergency and should be followed up as soon as possible.  Feel free to call the clinic you have any questions or concerns. The clinic phone number is [phone].  Please show the CHEMO ALERT CARD at check-in to the Emergency Department and triage nurse.   

## 2017-01-02 NOTE — Assessment & Plan Note (Signed)
He tolerated cycle 1 of treatment poorly due to uncontrolled nausea and vomiting I will modify his anti-emetics by adding Aloxi We will also schedule IV fluids tomorrow for supportive care I plan to see him back again next week for further assessment

## 2017-01-03 ENCOUNTER — Other Ambulatory Visit (HOSPITAL_COMMUNITY): Payer: Self-pay | Admitting: Interventional Radiology

## 2017-01-03 ENCOUNTER — Ambulatory Visit (HOSPITAL_BASED_OUTPATIENT_CLINIC_OR_DEPARTMENT_OTHER): Payer: Medicaid Other | Admitting: Nurse Practitioner

## 2017-01-03 ENCOUNTER — Telehealth: Payer: Self-pay | Admitting: Nurse Practitioner

## 2017-01-03 VITALS — BP 129/79 | HR 98 | Temp 99.3°F | Resp 14 | Ht 70.0 in | Wt 170.5 lb

## 2017-01-03 DIAGNOSIS — C321 Malignant neoplasm of supraglottis: Secondary | ICD-10-CM | POA: Diagnosis not present

## 2017-01-03 DIAGNOSIS — R110 Nausea: Secondary | ICD-10-CM | POA: Diagnosis not present

## 2017-01-03 DIAGNOSIS — R633 Feeding difficulties, unspecified: Secondary | ICD-10-CM

## 2017-01-03 MED ORDER — SODIUM CHLORIDE 0.9 % IJ SOLN
10.0000 mL | INTRAMUSCULAR | Status: DC | PRN
  Administered 2017-01-03: 10 mL
  Filled 2017-01-03: qty 10

## 2017-01-03 MED ORDER — HEPARIN SOD (PORK) LOCK FLUSH 100 UNIT/ML IV SOLN
500.0000 [IU] | Freq: Once | INTRAVENOUS | Status: AC | PRN
  Administered 2017-01-03: 500 [IU]
  Filled 2017-01-03: qty 5

## 2017-01-03 MED ORDER — HYDROMORPHONE HCL 4 MG/ML IJ SOLN
2.0000 mg | INTRAMUSCULAR | Status: DC | PRN
Start: 2017-01-03 — End: 2017-01-03
  Administered 2017-01-03: 2 mg via INTRAVENOUS

## 2017-01-03 MED ORDER — ONDANSETRON HCL 4 MG/2ML IJ SOLN
8.0000 mg | Freq: Once | INTRAMUSCULAR | Status: AC
  Administered 2017-01-03: 8 mg via INTRAVENOUS

## 2017-01-03 MED ORDER — FAMOTIDINE IN NACL 20-0.9 MG/50ML-% IV SOLN
20.0000 mg | Freq: Once | INTRAVENOUS | Status: AC
  Administered 2017-01-03: 20 mg via INTRAVENOUS

## 2017-01-03 MED ORDER — SODIUM CHLORIDE 0.9 % IV SOLN
Freq: Once | INTRAVENOUS | Status: AC
  Administered 2017-01-03: 11:00:00 via INTRAVENOUS

## 2017-01-03 MED ORDER — ONDANSETRON HCL 4 MG/2ML IJ SOLN
INTRAMUSCULAR | Status: AC
  Filled 2017-01-03: qty 4

## 2017-01-03 MED ORDER — FAMOTIDINE IN NACL 20-0.9 MG/50ML-% IV SOLN
INTRAVENOUS | Status: AC
  Filled 2017-01-03: qty 50

## 2017-01-03 MED ORDER — HYDROMORPHONE HCL 4 MG/ML IJ SOLN
INTRAMUSCULAR | Status: AC
  Filled 2017-01-03: qty 1

## 2017-01-03 MED ORDER — SODIUM CHLORIDE 0.9 % IV SOLN: Freq: Once | INTRAVENOUS | Status: DC

## 2017-01-03 NOTE — Telephone Encounter (Signed)
Called to confirm [facility] appt tomorrow at 10 am per LOS

## 2017-01-03 NOTE — Progress Notes (Signed)
Pt reports nausea and vomiting started last night.  He vomited twice last night and also twice this morning.  States unable to keep food/ fluids down.  He also reports  "heartburn" and says this is his most distressing symptom today.  Notified Dr. Alvy Bimler and she ordered IV Pepcid today.  She also instructed to add IVFs tomorrow and the rest of this week thru Saturday.  Discussed w/ pt and he says he wants to come tomorrow but wants to wait to see how he feels tomorrow before scheduling the rest of this week.  He is hoping he will feel better and be able to take fluids at home after tomorrow.  Will reassess tomorrow.

## 2017-01-04 ENCOUNTER — Ambulatory Visit (HOSPITAL_COMMUNITY)
Admission: RE | Admit: 2017-01-04 | Discharge: 2017-01-04 | Disposition: A | Discharge status: Home / Self Care | Payer: Medicaid Other | Source: Ambulatory Visit | Attending: Interventional Radiology | Admitting: Interventional Radiology

## 2017-01-04 ENCOUNTER — Encounter (HOSPITAL_COMMUNITY): Payer: Self-pay

## 2017-01-04 ENCOUNTER — Encounter: Payer: Self-pay | Admitting: Nurse Practitioner

## 2017-01-04 ENCOUNTER — Ambulatory Visit (HOSPITAL_BASED_OUTPATIENT_CLINIC_OR_DEPARTMENT_OTHER): Payer: Medicaid Other | Admitting: Nurse Practitioner

## 2017-01-04 VITALS — BP 122/67 | HR 84 | Temp 97.6°F | Resp 17 | Ht 70.0 in | Wt 171.3 lb

## 2017-01-04 DIAGNOSIS — R633 Feeding difficulties, unspecified: Secondary | ICD-10-CM

## 2017-01-04 DIAGNOSIS — C321 Malignant neoplasm of supraglottis: Secondary | ICD-10-CM

## 2017-01-04 DIAGNOSIS — R110 Nausea: Secondary | ICD-10-CM

## 2017-01-04 MED ORDER — FAMOTIDINE IN NACL 20-0.9 MG/50ML-% IV SOLN
20.0000 mg | Freq: Once | INTRAVENOUS | Status: AC
  Administered 2017-01-04: 20 mg via INTRAVENOUS

## 2017-01-04 MED ORDER — HYDROMORPHONE HCL 4 MG/ML IJ SOLN
2.0000 mg | INTRAMUSCULAR | Status: DC | PRN
  Administered 2017-01-04: 2 mg via INTRAVENOUS

## 2017-01-04 MED ORDER — SODIUM CHLORIDE 0.9 % IV SOLN: Freq: Once | INTRAVENOUS | Status: DC

## 2017-01-04 MED ORDER — HYDROMORPHONE HCL 4 MG/ML IJ SOLN
INTRAMUSCULAR | Status: AC
  Filled 2017-01-04: qty 1

## 2017-01-04 MED ORDER — ONDANSETRON HCL 4 MG/2ML IJ SOLN
8.0000 mg | Freq: Once | INTRAMUSCULAR | Status: AC
  Administered 2017-01-04: 8 mg via INTRAVENOUS

## 2017-01-04 MED ORDER — SODIUM CHLORIDE 0.9 % IV SOLN
Freq: Once | INTRAVENOUS | Status: AC
  Administered 2017-01-04: 10:00:00 via INTRAVENOUS

## 2017-01-04 MED ORDER — FAMOTIDINE IN NACL 20-0.9 MG/50ML-% IV SOLN
INTRAVENOUS | Status: AC
  Filled 2017-01-04: qty 50

## 2017-01-04 MED ORDER — ONDANSETRON HCL 4 MG/2ML IJ SOLN
INTRAMUSCULAR | Status: AC
  Filled 2017-01-04: qty 4

## 2017-01-04 MED ORDER — HEPARIN SOD (PORK) LOCK FLUSH 100 UNIT/ML IV SOLN
500.0000 [IU] | Freq: Once | INTRAVENOUS | Status: AC | PRN
  Administered 2017-01-04: 500 [IU]
  Filled 2017-01-04: qty 5

## 2017-01-04 MED ORDER — SODIUM CHLORIDE 0.9 % IJ SOLN
10.0000 mL | INTRAMUSCULAR | Status: DC | PRN
  Administered 2017-01-04: 10 mL
  Filled 2017-01-04: qty 10

## 2017-01-04 NOTE — Patient Instructions (Signed)
Dehydration, Adult Dehydration is a condition in which there is not enough fluid or water in the body. This happens when you lose more fluids than you take in. Important organs, such as the kidneys, brain, and heart, cannot function without a proper amount of fluids. Any loss of fluids from the body can lead to dehydration. Dehydration can range from mild to severe. This condition should be treated right away to prevent it from becoming severe. What are the causes? This condition may be caused by:  Vomiting.  Diarrhea.  Excessive sweating, such as from heat exposure or exercise.  Not drinking enough fluid, especially:  When ill.  While doing activity that requires a lot of energy.  Excessive urination.  Fever.  Infection.  Certain medicines, such as medicines that cause the body to lose excess fluid (diuretics).  Inability to access safe drinking water.  Reduced physical ability to get adequate water and food. What increases the risk? This condition is more likely to develop in people:  Who have a poorly controlled long-term (chronic) illness, such as diabetes, heart disease, or kidney disease.  Who are age 65 or older.  Who are disabled.  Who live in a place with high altitude.  Who play endurance sports. What are the signs or symptoms? Symptoms of mild dehydration may include:   Thirst.  Dry lips.  Slightly dry mouth.  Dry, warm skin.  Dizziness. Symptoms of moderate dehydration may include:   Very dry mouth.  Muscle cramps.  Dark urine. Urine may be the color of tea.  Decreased urine production.  Decreased tear production.  Heartbeat that is irregular or faster than normal (palpitations).  Headache.  Light-headedness, especially when you stand up from a sitting position.  Fainting (syncope). Symptoms of severe dehydration may include:   Changes in skin, such as:  Cold and clammy skin.  Blotchy (mottled) or pale skin.  Skin that does  not quickly return to normal after being lightly pinched and released (poor skin turgor).  Changes in body fluids, such as:  Extreme thirst.  No tear production.  Inability to sweat when body temperature is high, such as in hot weather.  Very little urine production.  Changes in vital signs, such as:  Weak pulse.  Pulse that is more than 100 beats a minute when sitting still.  Rapid breathing.  Low blood pressure.  Other changes, such as:  Sunken eyes.  Cold hands and feet.  Confusion.  Lack of energy (lethargy).  Difficulty waking up from sleep.  Short-term weight loss.  Unconsciousness. How is this diagnosed? This condition is diagnosed based on your symptoms and a physical exam. Blood and urine tests may be done to help confirm the diagnosis. How is this treated? Treatment for this condition depends on the severity. Mild or moderate dehydration can often be treated at home. Treatment should be started right away. Do not wait until dehydration becomes severe. Severe dehydration is an emergency and it needs to be treated in a hospital. Treatment for mild dehydration may include:   Drinking more fluids.  Replacing salts and minerals in your blood (electrolytes) that you may have lost. Treatment for moderate dehydration may include:   Drinking an oral rehydration solution (ORS). This is a drink that helps you replace fluids and electrolytes (rehydrate). It can be found at pharmacies and retail stores. Treatment for severe dehydration may include:   Receiving fluids through an IV tube.  Receiving an electrolyte solution through a feeding tube that is   passed through your nose and into your stomach (nasogastric tube, or NG tube).  Correcting any abnormalities in electrolytes.  Treating the underlying cause of dehydration. Follow these instructions at home:  If directed by your health care provider, drink an ORS:  Make an ORS by following instructions on the  package.  Start by drinking small amounts, about  cup (120 mL) every 5-10 minutes.  Slowly increase how much you drink until you have taken the amount recommended by your health care provider.  Drink enough clear fluid to keep your urine clear or pale yellow. If you were told to drink an ORS, finish the ORS first, then start slowly drinking other clear fluids. Drink fluids such as:  Water. Do not drink only water. Doing that can lead to having too little salt (sodium) in the body (hyponatremia).  Ice chips.  Fruit juice that you have added water to (diluted fruit juice).  Low-calorie sports drinks.  Avoid:  Alcohol.  Drinks that contain a lot of sugar. These include high-calorie sports drinks, fruit juice that is not diluted, and soda.  Caffeine.  Foods that are greasy or contain a lot of fat or sugar.  Take over-the-counter and prescription medicines only as told by your health care provider.  Do not take sodium tablets. This can lead to having too much sodium in the body (hypernatremia).  Eat foods that contain a healthy balance of electrolytes, such as bananas, oranges, potatoes, tomatoes, and spinach.  Keep all follow-up visits as told by your health care provider. This is important. Contact a health care provider if:  You have abdominal pain that:  Gets worse.  Stays in one area (localizes).  You have a rash.  You have a stiff neck.  You are more irritable than usual.  You are sleepier or more difficult to wake up than usual.  You feel weak or dizzy.  You feel very thirsty.  You have urinated only a small amount of very dark urine over 6-8 hours. Get help right away if:  You have symptoms of severe dehydration.  You cannot drink fluids without vomiting.  Your symptoms get worse with treatment.  You have a fever.  You have a severe headache.  You have vomiting or diarrhea that:  Gets worse.  Does not go away.  You have blood or green matter  (bile) in your vomit.  You have blood in your stool. This may cause stool to look black and tarry.  You have not urinated in 6-8 hours.  You faint.  Your heart rate while sitting still is over 100 beats a minute.  You have trouble breathing. This information is not intended to replace advice given to you by your health care provider. Make sure you discuss any questions you have with your health care provider. Document Released: 11/28/2005 Document Revised: 06/24/2016 Document Reviewed: 01/22/2016 Elsevier Interactive Patient Education  2017 [employer]  

## 2017-01-04 NOTE — Progress Notes (Signed)
RN visit only. 

## 2017-01-06 ENCOUNTER — Other Ambulatory Visit (HOSPITAL_COMMUNITY): Payer: Self-pay

## 2017-01-09 ENCOUNTER — Ambulatory Visit (HOSPITAL_BASED_OUTPATIENT_CLINIC_OR_DEPARTMENT_OTHER): Payer: Medicaid Other | Admitting: Hematology and Oncology

## 2017-01-09 ENCOUNTER — Encounter: Payer: Self-pay | Admitting: Hematology and Oncology

## 2017-01-09 ENCOUNTER — Telehealth: Payer: Self-pay | Admitting: *Deleted

## 2017-01-09 ENCOUNTER — Ambulatory Visit (HOSPITAL_BASED_OUTPATIENT_CLINIC_OR_DEPARTMENT_OTHER): Payer: Medicaid Other

## 2017-01-09 ENCOUNTER — Ambulatory Visit: Payer: Medicaid Other

## 2017-01-09 ENCOUNTER — Other Ambulatory Visit (HOSPITAL_BASED_OUTPATIENT_CLINIC_OR_DEPARTMENT_OTHER): Payer: Medicaid Other

## 2017-01-09 ENCOUNTER — Telehealth: Payer: Self-pay | Admitting: Hematology and Oncology

## 2017-01-09 DIAGNOSIS — D6481 Anemia due to antineoplastic chemotherapy: Secondary | ICD-10-CM | POA: Diagnosis not present

## 2017-01-09 DIAGNOSIS — R5383 Other fatigue: Secondary | ICD-10-CM

## 2017-01-09 DIAGNOSIS — C770 Secondary and unspecified malignant neoplasm of lymph nodes of head, face and neck: Secondary | ICD-10-CM

## 2017-01-09 DIAGNOSIS — C321 Malignant neoplasm of supraglottis: Secondary | ICD-10-CM | POA: Diagnosis not present

## 2017-01-09 DIAGNOSIS — G620 Drug-induced polyneuropathy: Secondary | ICD-10-CM

## 2017-01-09 DIAGNOSIS — R112 Nausea with vomiting, unspecified: Secondary | ICD-10-CM | POA: Diagnosis not present

## 2017-01-09 DIAGNOSIS — G893 Neoplasm related pain (acute) (chronic): Secondary | ICD-10-CM

## 2017-01-09 DIAGNOSIS — R110 Nausea: Secondary | ICD-10-CM

## 2017-01-09 DIAGNOSIS — Z5111 Encounter for antineoplastic chemotherapy: Secondary | ICD-10-CM

## 2017-01-09 DIAGNOSIS — T451X5A Adverse effect of antineoplastic and immunosuppressive drugs, initial encounter: Secondary | ICD-10-CM

## 2017-01-09 LAB — COMPREHENSIVE METABOLIC PANEL
ALT: 14 U/L (ref 0–55)
ANION GAP: 11 meq/L (ref 3–11)
AST: 11 U/L (ref 5–34)
Albumin: 3.8 g/dL (ref 3.5–5.0)
Alkaline Phosphatase: 52 U/L (ref 40–150)
BUN: 10.9 mg/dL (ref 7.0–26.0)
CHLORIDE: 103 meq/L (ref 98–109)
CO2: 27 meq/L (ref 22–29)
Calcium: 9.8 mg/dL (ref 8.4–10.4)
Creatinine: 0.7 mg/dL (ref 0.7–1.3)
EGFR: >90 mL/min/{1.73_m2} (ref >90)
GLUCOSE: 110 mg/dL (ref 70–140)
Potassium: 3.8 mEq/L (ref 3.5–5.1)
SODIUM: 140 meq/L (ref 136–145)
Total Bilirubin: 0.38 mg/dL (ref 0.20–1.20)
Total Protein: 7 g/dL (ref 6.4–8.3)

## 2017-01-09 LAB — CBC WITH DIFFERENTIAL/PLATELET
BASO%: 0.2 % (ref 0.0–2.0)
Basophils Absolute: 0 10*3/uL (ref 0.0–0.1)
EOS%: 0.5 % (ref 0.0–7.0)
Eosinophils Absolute: 0 10*3/uL (ref 0.0–0.5)
HCT: 35.6 % — ABNORMAL LOW (ref 38.4–49.9)
HGB: 12 g/dL — ABNORMAL LOW (ref 13.0–17.1)
LYMPH%: 6.1 % — AB (ref 14.0–49.0)
MCH: 33.3 pg (ref 27.2–33.4)
MCHC: 33.7 g/dL (ref 32.0–36.0)
MCV: 98.9 fL — AB (ref 79.3–98.0)
MONO#: 0.5 10*3/uL (ref 0.1–0.9)
MONO%: 9 % (ref 0.0–14.0)
NEUT#: 4.7 10*3/uL (ref 1.5–6.5)
NEUT%: 84.2 % — AB (ref 39.0–75.0)
PLATELETS: 188 10*3/uL (ref 140–400)
RBC: 3.6 10*6/uL — AB (ref 4.20–5.82)
RDW: 14.5 % (ref 11.0–14.6)
WBC: 5.5 10*3/uL (ref 4.0–10.3)
lymph#: 0.3 10*3/uL — ABNORMAL LOW (ref 0.9–3.3)

## 2017-01-09 LAB — TSH: TSH: 1.959 m(IU)/L (ref 0.320–4.118)

## 2017-01-09 MED ORDER — SODIUM CHLORIDE 0.9 % IV SOLN
Freq: Once | INTRAVENOUS | Status: AC
  Administered 2017-01-09: 14:00:00 via INTRAVENOUS

## 2017-01-09 MED ORDER — PACLITAXEL CHEMO INJECTION 300 MG/50ML
80.0000 mg/m2 | Freq: Once | INTRAVENOUS | Status: AC
  Administered 2017-01-09: 156 mg via INTRAVENOUS
  Filled 2017-01-09: qty 26

## 2017-01-09 MED ORDER — FAMOTIDINE IN NACL 20-0.9 MG/50ML-% IV SOLN
20.0000 mg | Freq: Once | INTRAVENOUS | Status: AC
  Administered 2017-01-09: 20 mg via INTRAVENOUS

## 2017-01-09 MED ORDER — PALONOSETRON HCL INJECTION 0.25 MG/5ML
INTRAVENOUS | Status: AC
  Filled 2017-01-09: qty 5

## 2017-01-09 MED ORDER — DIPHENHYDRAMINE HCL 50 MG/ML IJ SOLN
50.0000 mg | Freq: Once | INTRAMUSCULAR | Status: AC
  Administered 2017-01-09: 50 mg via INTRAVENOUS

## 2017-01-09 MED ORDER — DIPHENHYDRAMINE HCL 50 MG/ML IJ SOLN
INTRAMUSCULAR | Status: AC
  Filled 2017-01-09: qty 1

## 2017-01-09 MED ORDER — HEPARIN SOD (PORK) LOCK FLUSH 100 UNIT/ML IV SOLN
500.0000 [IU] | Freq: Once | INTRAVENOUS | Status: AC | PRN
  Administered 2017-01-09: 500 [IU]
  Filled 2017-01-09: qty 5

## 2017-01-09 MED ORDER — FAMOTIDINE IN NACL 20-0.9 MG/50ML-% IV SOLN
INTRAVENOUS | Status: AC
  Filled 2017-01-09: qty 50

## 2017-01-09 MED ORDER — HYDROMORPHONE HCL 4 MG/ML IJ SOLN
2.0000 mg | INTRAMUSCULAR | Status: DC | PRN
  Administered 2017-01-09: 2 mg via INTRAVENOUS

## 2017-01-09 MED ORDER — GUAIFENESIN-DM 100-10 MG/5ML PO SYRP
10.0000 mL | ORAL_SOLUTION | ORAL | Status: DC | PRN
  Administered 2017-01-09: 10 mL via ORAL
  Filled 2017-01-09: qty 10

## 2017-01-09 MED ORDER — SODIUM CHLORIDE 0.9 % IV SOLN
20.0000 mg | Freq: Once | INTRAVENOUS | Status: AC
  Administered 2017-01-09: 20 mg via INTRAVENOUS
  Filled 2017-01-09: qty 2

## 2017-01-09 MED ORDER — FLUTICASONE PROPIONATE 50 MCG/ACT NA SUSP: 2.0000 | Freq: Every day | NASAL | 2 refills | Status: DC

## 2017-01-09 MED ORDER — SODIUM CHLORIDE 0.9% FLUSH
10.0000 mL | INTRAVENOUS | Status: DC | PRN
  Administered 2017-01-09: 10 mL
  Filled 2017-01-09: qty 10

## 2017-01-09 MED ORDER — SODIUM CHLORIDE 0.9 % IJ SOLN
10.0000 mL | INTRAMUSCULAR | Status: DC | PRN
Start: 2017-01-09 — End: 2017-01-09
  Administered 2017-01-09: 10 mL
  Filled 2017-01-09: qty 10

## 2017-01-09 MED ORDER — HYDROMORPHONE HCL 4 MG/ML IJ SOLN
INTRAMUSCULAR | Status: AC
  Filled 2017-01-09: qty 1

## 2017-01-09 MED ORDER — PALONOSETRON HCL INJECTION 0.25 MG/5ML
0.2500 mg | Freq: Once | INTRAVENOUS | Status: AC
  Administered 2017-01-09: 0.25 mg via INTRAVENOUS

## 2017-01-09 MED FILL — FLUTICASONE PROP 50 MCG SPR: 50 | 30 days supply | Qty: 16 | Fill #0

## 2017-01-09 NOTE — Progress Notes (Signed)
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[place])   03/15/2015 - 03/17/2015 Hospital Admission    He was admitted to the hospital for evaluation of dysphagia, SOB, hemoptosis, hoarseness, 30-40 pound weight loss and worsening bilateral neck masses for 5 months      03/15/2015 Imaging    Ct showed extensive circumferential malignancy in the hypopharyngeal/supraglottic region with regional LN metastases      03/16/2015 Procedure    He underwent ULTRASOUND-GUIDED BIOPSY OF LEFT CERVICAL LYMPH NODES      03/16/2015 Pathology Results    Accession: [id number] LN biopsy showed invasive squamous cell cancer      03/16/2015 Pathology Results    Accession: [id number] showed atypical squamous cells      03/25/2015 - 04/07/2015 Hospital Admission    He was admitted to the hospital and underwent tracheostomy placement, feeeding tube placement but subsequently left [facility]      03/26/2015 Surgery    He had multiple extraction of tooth numbers 1, 2, 5, 6, 7, 8, 9, 10, 11, 12, 13, 18, 19, 21, 22, 23, 24, 25, 26, 27, 28, and 29. and 4 Quadrants of alveoloplasty      03/26/2015 Surgery    He underwent tracheostomy      03/31/2015 Surgery    He had open gastrostomy tube placement by Dr. Donne Hazel      04/16/2015 - 05/18/2015 Radiation Therapy    Laryngopharynx and bilateral neck / 50 Gy in 20 fractions to gross disease, 45 Gy in 20 fractions to high risk nodal echelons  Beams/energy: Helical IMRT / 6 MV photons      04/16/2015 Procedure    Fluoroscopic reposition of the 18 French gastrostomy confirmed back in the stomach,      07/03/2015 Procedure    IR performed  replacement of gastrostomy tube with a new 70 French balloon retention tube      10/01/2015 Imaging    PEt scan showed persistent hypermetabolism within the primary supraglottic laryngeal tumor and within right retropharyngeal, bilateral level II and left level IV cervical nodal metastases      10/28/2015 Procedure    He had placement of PICC line. The IR was not able to place PORT due to suspected upper respiratory infection      11/13/2015 - 03/21/2016 Chemotherapy    He received 5FU, carboplatin chemo with weekly Erbitux      11/19/2015 Surgery    Gastrostomy tube replaced.      11/30/2015 Procedure    Placement of right jugular port-a-cath.      11/30/2015 Procedure    PICC removed.      12/30/2015 Imaging    PET CT showed positive response to Rx      02/26/2016 Procedure    He underwent direct laryngoscopy with biopsy. Esophageal dilatation.       02/26/2016 Pathology Results    Repeat biopsy of supraglottis showed persistent disease      02/29/2016 Procedure    Gastrostomy tube exchanged.      03/28/2016 PET scan    Hypermetabolic tissue in the posterior RIGHT hypopharynx is similar in pattern to PET-CT of 12/30/2015 but increased in metabolic activity.2. Hypermetabolic tissue /  lymph nodes in the LEFT supraclavicular nodal station are in a similar pattern       04/04/2016 - 11/29/2016 Chemotherapy    He started on palliative chemotherapy with pembrolizumab      05/03/2016 Imaging    MRI head is negative      06/02/2016 Imaging    Mild improvement of diffuse pharyngeal and supraglottic edema.Increased asymmetry of right-sided hypopharyngeal/supraglottic softtissue compared to the prior neck CT with FDG uptake in this region on prior PET-CT. Residual/recurrent tumor is possible      08/09/2016 Procedure    IR placed new 20 French percutaneous gastrostomy tube.      08/26/2016 Imaging    Ct neck showed unchanged diffuse pharyngeal and supraglottic edema as well  as asymmetric soft tissue thickening on the right. Slightly decreased size of some left level II lymph nodes. Unchanged lymphadenopathy elsewhere in the neck. Decreased size of thyroid mass. No evidence of metastatic cancer to the chest      12/13/2016 Imaging    Ct chest showed no evidence of thoracic metastatic disease or primary thoracic malignancy.      12/13/2016 Imaging    Ct neck showed progression of of RIGHT supraglottic mass compared with priors, approximate size 24 x 27 x 27 mm. Extension caudally along the RIGHT area epiglottic fold with increasing mass effect on the airway. Tracheostomy satisfactory position. Stable to slightly improved malignant adenopathy.      12/26/2016 -  Chemotherapy    He received chemotherapy with weekly Taxol        INTERVAL HISTORY: Please see below for problem oriented charting. He returns today for week 3 of treatment. He has significant signs and symptoms of nausea & vomiting last week. He felt that additional days of IV fluid support was helpful. He has intermittent bleeding from the tracheostomy site, self limiting, only when he coughs. He felt that his throat pain is stable. He denies recent constipation. 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  I have reviewed the past medical history, past surgical history, social history and family history with the patient and they are unchanged from previous note.  ALLERGIES:   is allergic to aspirin.  MEDICATIONS:  Current Outpatient Prescriptions  Medication Sig Dispense Refill  . fluticasone (FLONASE) 50 MCG/ACT nasal spray Place 2 sprays into both nostrils daily. 16 g 2  . guaiFENesin (ROBITUSSIN) 100 MG/5ML SOLN Take 5 mLs (100 mg total) by mouth every 4 (four) hours as needed for cough or to loosen phlegm. 473 mL 3  . levothyroxine (SYNTHROID, LEVOTHROID) 50 MCG tablet Take 1 tablet (50 mcg total) by mouth daily before breakfast. 30 tablet 9  . LORazepam (ATIVAN) 1 MG tablet Take 1 tablet (1 mg total) by mouth 2 (two) times daily as needed for anxiety (or nausea). 10 tablet 0  . morphine (MS CONTIN) 30 MG 12 hr tablet Take 1 tablet (30 mg total) by mouth every 12 (twelve) hours. 60 tablet 0  . morphine (MSIR) 30 MG tablet Take 1 tablet (30 mg total) by mouth  every 4 (four) hours as needed for severe pain. 90 tablet 0  . Nutritional Supplements (FEEDING SUPPLEMENT, JEVITY 1.5 CAL/FIBER,) LIQD Give 1 can Osmolite 1.2 + 1 can of Jevity 1.5 QID via PEG with 60 cc free water before and after bolus. Flush with 240 cc free water BID between feedings. 948 mL   . omeprazole (PRILOSEC) 20 MG capsule Take 1 capsule (20 mg total) by mouth 2 (two) times daily. 60 capsule 0  . ondansetron (ZOFRAN) 8 MG tablet Take 1 tablet (8 mg total) by mouth every 8 (eight) hours as needed for nausea. 60 tablet 3  . SODIUM CHLORIDE, EXTERNAL, 0.9 % SOLN Use to clean around Trach and perform Trach care once daily and PRN 1000 mL 11  . lidocaine (XYLOCAINE) 2 % solution Use as directed 15 mLs in the mouth or throat every 8 (eight) hours as needed for mouth pain. (Patient not taking: Reported on 01/09/2017) 100 mL 0  . polyethylene glycol (MIRALAX) packet Take 17 g by mouth daily. (Patient not taking: Reported on 01/09/2017) 14 each 0  . triamcinolone (NASACORT AQ) 55 MCG/ACT AERO nasal inhaler Place 2 sprays into the nose daily. (Patient not taking: Reported on 01/09/2017) 1 Inhaler 12   No  current facility-administered medications for this visit.    Facility-Administered Medications Ordered in Other Visits  Medication Dose Route Frequency Provider Last Rate Last Dose  . HYDROmorphone (DILAUDID) injection 2 mg  2 mg Intravenous Q2H PRN [name], MD   2 mg at 12/26/16 1440  . sodium chloride 0.9 % injection 10 mL  10 mL Intracatheter PRN [name], MD   10 mL at 01/02/17 1107  . sodium chloride 0.9 % injection 10 mL  10 mL Intracatheter PRN [name], MD   10 mL at 01/09/17 1200  . sodium chloride flush (NS) 0.9 % injection 10 mL  10 mL Intracatheter PRN [name], MD   10 mL at 12/26/16 1708    PHYSICAL EXAMINATION: ECOG PERFORMANCE STATUS: 1 - Symptomatic but completely ambulatory  Vitals:   01/09/17 1153  BP: 128/72  Pulse: (!) 101  Resp: 20  Temp: 99.4 F (37.4 C)   Filed Weights   01/09/17 1153  Weight: 168 lb 11.2 oz (76.5 kg)    GENERAL:alert, no distress and comfortable SKIN: skin color, texture, turgor are normal, no rashes or significant lesions EYES: normal, Conjunctiva are pink and non-injected, sclera clear OROPHARYNX:no exudate, no erythema and lips, buccal mucosa, and tongue normal  NECK: supple, thyroid normal size, non-tender, without nodularity LYMPH:  Tracheostomy site looks clean without signs of bleeding or infection LUNGS: clear to auscultation and percussion with normal breathing effort HEART: regular rate & rhythm and no murmurs and no lower extremity edema ABDOMEN:abdomen soft, non-tender and normal bowel sounds Musculoskeletal:no cyanosis of digits and no clubbing  NEURO: alert & oriented x 3 with fluent speech, no focal motor/sensory deficits  LABORATORY DATA:  I have reviewed the data as listed    Component Value Date/Time   NA 139 01/02/2017 1033   K 3.7 01/02/2017 1033   CL 99 (L) 12/27/2016 1748   CO2 28 01/02/2017 1033   GLUCOSE 104 01/02/2017 1033   BUN 17.7 01/02/2017 1033   CREATININE 0.8 01/02/2017 1033   CALCIUM  9.8 01/02/2017 1033   PROT 7.2 01/02/2017 1033   ALBUMIN 3.8 01/02/2017 1033   AST 12 01/02/2017 1033   ALT 12 01/02/2017 1033   ALKPHOS 54 01/02/2017 1033  BILITOT 0.67 01/02/2017 1033   GFRNONAA >60 12/27/2016 1748   GFRAA >60 12/27/2016 1748    No results found for: SPEP, UPEP  Lab Results  Component Value Date   WBC 5.5 01/09/2017   NEUTROABS 4.7 01/09/2017   HGB 12.0 (L) 01/09/2017   HCT 35.6 (L) 01/09/2017   MCV 98.9 (H) 01/09/2017   PLT 188 01/09/2017      Chemistry      Component Value Date/Time   NA 139 01/02/2017 1033   K 3.7 01/02/2017 1033   CL 99 (L) 12/27/2016 1748   CO2 28 01/02/2017 1033   BUN 17.7 01/02/2017 1033   CREATININE 0.8 01/02/2017 1033      Component Value Date/Time   CALCIUM 9.8 01/02/2017 1033   ALKPHOS 54 01/02/2017 1033   AST 12 01/02/2017 1033   ALT 12 01/02/2017 1033   BILITOT 0.67 01/02/2017 1033       RADIOGRAPHIC STUDIES: I have personally reviewed the radiological images as listed and agreed with the findings in the report. Ct Soft Tissue Neck W Contrast  Result Date: 12/13/2016 CLINICAL DATA:  Restaging supraglottic cancer. Status post radiation. Ongoing chemotherapy. Patient continues to smoke. EXAM: CT NECK WITH CONTRAST TECHNIQUE: Multidetector CT imaging of the neck was performed using the standard protocol following the bolus administration of intravenous contrast. CONTRAST:  50mL ISOVUE-300 IOPAMIDOL (ISOVUE-300) INJECTION 61% COMPARISON:  08/26/2016. FINDINGS: Pharynx and larynx: Diffuse pharyngeal and subglottic edema redemonstrated. Supraglottic mass approximately 24 x 27 x 27 mm shows increased mass effect on the airway compared previous study in September. Compare orthogonal axial image 50 series 20 with previous image 61 series 13. Increasing mass effect/tumor extends along the RIGHT aryepiglottic fold to the level of the glottis, where mild increased narrowing of the airway is also observed when compared with priors.  Unchanged in satisfactory tracheostomy tube placement. Salivary glands: No inflammation, mass, or stone. Post radiation changes. Thyroid: Continued involution/stability of previously noted LEFT thyroid mass, now 11 x 14 mm, compared 9 x 17 mm most recent Lymph nodes: RIGHT retropharyngeal malignant adenopathy, unchanged 13 mm short axis, reference image 25 series 20. Necrotic RIGHT level IIA lymph node, 11 mm short axis image 37 series 20, stable. Necrotic LEFT level IIB lymph node, image 51 series 20, 10 mm short axis unchanged. LEFT level IIA lymph nodes poorly visualized due to stranding, probably improved. Stable LEFT level IV lymph node or nodes, up to 8 mm short axis. Vascular: Atherosclerosis.  Grossly patent. Limited intracranial: Limited, negative Visualized orbits: Not visualized Mastoids and visualized paranasal sinuses: Grossly unremarkable. Skeleton: Spondylosis.  No destructive lesion.  Edentulous. Upper chest: Reported separately Other: None. IMPRESSION: Progression of of RIGHT supraglottic mass compared with priors, approximate size 24 x 27 x 27 mm. Extension caudally along the RIGHT area epiglottic fold with increasing mass effect on the airway. Tracheostomy satisfactory position. Stable to slightly improved malignant adenopathy. Electronically Signed   By: Staci Righter M.D.   On: 12/13/2016 10:04   Ct Chest W Contrast  Result Date: 12/13/2016 CLINICAL DATA:  Supraglottic carcinoma diagnosed in April 2016 with cervical metastatic lymphadenopathy. Restaging. Chemotherapy ongoing. EXAM: CT CHEST WITH CONTRAST TECHNIQUE: Multidetector CT imaging of the chest was performed during intravenous contrast administration. CONTRAST:  96mL ISOVUE-300 IOPAMIDOL (ISOVUE-300) INJECTION 61% COMPARISON:  CT 08/26/2016 and 06/02/2016. FINDINGS: This study was performed in conjunction with CT of the neck, dictated separately. Cardiovascular: Right IJ Port-A-Cath tip extends into the upper right atrium. There is  mild  aortic and great vessel atherosclerosis. No acute vascular findings are seen on noncontrast imaging. The heart size is normal. There is no pericardial effusion. Mediastinum/Nodes: There are no enlarged mediastinal, hilar or axillary lymph nodes.Hilar assessment is limited by the lack of intravenous contrast, although the hilar contours appear unchanged. Tracheostomy appears well positioned. Previously noted left thyroid nodule is not well visualized. Lungs/Pleura: There is no pleural effusion. Probable mild radiation changes at both lung apices. The lungs are otherwise clear. Upper abdomen: Probable small gallstones. The visualized upper abdomen otherwise appears unremarkable. Musculoskeletal/Chest wall: There is no chest wall mass or suspicious osseous finding. There are old left-sided rib fractures. IMPRESSION: 1. No evidence of thoracic metastatic disease or primary thoracic malignancy. 2. Neck CT findings dictated separately. Electronically Signed   By: Richardean Sale M.D.   On: 12/13/2016 09:48    ASSESSMENT & PLAN:  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 I will at 2 days of IV fluids and anti-emetics support therapy for him. I will modify his chemotherapy by giving him a week break next week. Hence, his chemotherapy schedule would be on 3 weeks on 1 week off schedule. I plan to give him at least 2 cycles of treatment before repeat imaging study to assess response to treatment.   Chemotherapy-induced nausea He has significant nausea and vomiting, out of proportion, likely a component of anticipatory nausea We will continue IV fluid support. I plan to modify his future chemotherapy by adding Emend starting next month  Cancer associated pain This is overall stable He complained MS Contin caued too much sedation He can continue to take MS Contin at night and IR morphine as needed for pain during day  time His pain is stable  Anemia due to antineoplastic chemotherapy This is likely due to recent treatment. The patient denies recent history of bleeding such as epistaxis, hematuria or hematochezia. He is asymptomatic from the anemia. I will observe for now.  He does not require transfusion now. I will continue the chemotherapy at current dose without dosage adjustment.  If the anemia gets progressive worse in the future, I might have to delay his treatment or adjust the chemotherapy dose.   Peripheral neuropathy due to chemotherapy [facility]) he has mild peripheral neuropathy, likely related to side effects of treatment. It is only mild, not bothering the patient. I will observe for now If it gets worse in the future, I will consider modifying the dose of the treatment    No orders of the defined types were placed in this encounter.  All questions were answered. The patient knows to call the clinic with any problems, questions or concerns. No barriers to learning was detected. I spent 20 minutes counseling the patient face to face. The total time spent in the appointment was 30 minutes and more than 50% was on counseling and review of test results     [name], MD 01/09/2017 12:39 PM

## 2017-01-09 NOTE — Assessment & Plan Note (Addendum)
He has significant nausea and vomiting, out of proportion, likely a component of anticipatory nausea We will continue IV fluid support. I plan to modify his future chemotherapy by adding Emend starting next month

## 2017-01-09 NOTE — Assessment & Plan Note (Signed)
He tolerated chemotherapy very poorly with significant nausea and vomiting. I will at 2 days of IV fluids and anti-emetics support therapy for him. I will modify his chemotherapy by giving him a week break next week. Hence, his chemotherapy schedule would be on 3 weeks on 1 week off schedule. I plan to give him at least 2 cycles of treatment before repeat imaging study to assess response to treatment.

## 2017-01-09 NOTE — Telephone Encounter (Signed)
Per 1/29 LOS and staff message I have scheduled appts and gave calendar. Noitifed the scheduler

## 2017-01-09 NOTE — Assessment & Plan Note (Signed)
he has mild peripheral neuropathy, likely related to side effects of treatment. It is only mild, not bothering the patient. I will observe for now If it gets worse in the future, I will consider modifying the dose of the treatment  

## 2017-01-09 NOTE — Patient Instructions (Signed)

## 2017-01-09 NOTE — Telephone Encounter (Signed)
Appointments scheduled per 1/29 LOS. Patient given AVS report and calendars with future scheduled appointments. °

## 2017-01-09 NOTE — Progress Notes (Signed)
Submitted request for rent to be paid with statement to Altha Harm for [employer].

## 2017-01-09 NOTE — Progress Notes (Signed)
Visit with patient requested by Anda Latina. for patient with financial concerns.   Went to treatment area to visit patient who states he needs assistance with his rent. Patient lives in a motel and has a weekly rental amount. Asked patient about his source of income and he states he receives SSI and the amount received each month. Based on the information he provided, patient qualifies for the [place]. Patient signed for the grant and was set up in [place]. Patient has a copy of the grant as well as the expense sheet.  Spoke with Altha Harm whom is over the grant for being able to submit payment to his housing provider. Called to request needed information in order to have payment submitted and to confirm patient will be able to enter his room this afternoon when he leaves treatment pending payment. [email or web address]  advised me that he will. Will submit to be paid once all requested information is provided.   Advised patient the amount that will be submitted and he is responsible for the remaining balance. Patient has my card for any additional financial questions or concerns and states he is to meet with Lauren in [place] when he is done with treatment.

## 2017-01-09 NOTE — Assessment & Plan Note (Signed)
This is overall stable He complained MS Contin caued too much sedation He can continue to take MS Contin at night and IR morphine as needed for pain during day time His pain is stable

## 2017-01-09 NOTE — Assessment & Plan Note (Signed)
This is likely due to recent treatment. The patient denies recent history of bleeding such as epistaxis, hematuria or hematochezia. He is asymptomatic from the anemia. I will observe for now.  He does not require transfusion now. I will continue the chemotherapy at current dose without dosage adjustment.  If the anemia gets progressive worse in the future, I might have to delay his treatment or adjust the chemotherapy dose.  

## 2017-01-09 NOTE — Patient Instructions (Signed)
Cancer Center Discharge Instructions for Patients Receiving Chemotherapy  Today you received the following chemotherapy agents Taxol   To help prevent nausea and vomiting after your treatment, we encourage you to take your nausea medication as directed.   If you develop nausea and vomiting that is not controlled by your nausea medication, call the clinic.   BELOW ARE SYMPTOMS THAT SHOULD BE REPORTED IMMEDIATELY:  *FEVER GREATER THAN 100.5 F  *CHILLS WITH OR WITHOUT FEVER  NAUSEA AND VOMITING THAT IS NOT CONTROLLED WITH YOUR NAUSEA MEDICATION  *UNUSUAL SHORTNESS OF BREATH  *UNUSUAL BRUISING OR BLEEDING  TENDERNESS IN MOUTH AND THROAT WITH OR WITHOUT PRESENCE OF ULCERS  *URINARY PROBLEMS  *BOWEL PROBLEMS  UNUSUAL RASH Items with * indicate a potential emergency and should be followed up as soon as possible.  Feel free to call the clinic you have any questions or concerns. The clinic phone number is [phone].  Please show the CHEMO ALERT CARD at check-in to the Emergency Department and triage nurse.   

## 2017-01-10 ENCOUNTER — Ambulatory Visit (HOSPITAL_BASED_OUTPATIENT_CLINIC_OR_DEPARTMENT_OTHER): Payer: Medicaid Other | Admitting: Nurse Practitioner

## 2017-01-10 ENCOUNTER — Other Ambulatory Visit: Payer: Self-pay | Admitting: *Deleted

## 2017-01-10 ENCOUNTER — Encounter: Payer: Self-pay | Admitting: Nurse Practitioner

## 2017-01-10 VITALS — BP 120/68 | HR 102 | Temp 99.6°F | Resp 18 | Ht 70.0 in | Wt 170.2 lb

## 2017-01-10 DIAGNOSIS — C321 Malignant neoplasm of supraglottis: Secondary | ICD-10-CM

## 2017-01-10 DIAGNOSIS — R112 Nausea with vomiting, unspecified: Secondary | ICD-10-CM | POA: Diagnosis not present

## 2017-01-10 MED ORDER — HYDROMORPHONE HCL 4 MG/ML IJ SOLN
INTRAMUSCULAR | Status: AC
  Filled 2017-01-10: qty 1

## 2017-01-10 MED ORDER — FAMOTIDINE IN NACL 20-0.9 MG/50ML-% IV SOLN
20.0000 mg | Freq: Once | INTRAVENOUS | Status: AC
  Administered 2017-01-10: 20 mg via INTRAVENOUS

## 2017-01-10 MED ORDER — HEPARIN SOD (PORK) LOCK FLUSH 100 UNIT/ML IV SOLN
500.0000 [IU] | Freq: Once | INTRAVENOUS | Status: AC | PRN
  Administered 2017-01-10: 500 [IU]
  Filled 2017-01-10: qty 5

## 2017-01-10 MED ORDER — SODIUM CHLORIDE 0.9 % IV SOLN
Freq: Once | INTRAVENOUS | Status: AC
  Administered 2017-01-10: 11:00:00 via INTRAVENOUS

## 2017-01-10 MED ORDER — HYDROMORPHONE HCL 4 MG/ML IJ SOLN
2.0000 mg | INTRAMUSCULAR | Status: DC | PRN
Start: 2017-01-10 — End: 2017-01-10
  Administered 2017-01-10: 2 mg via INTRAVENOUS

## 2017-01-10 MED ORDER — FAMOTIDINE IN NACL 20-0.9 MG/50ML-% IV SOLN
INTRAVENOUS | Status: AC
  Filled 2017-01-10: qty 50

## 2017-01-10 MED ORDER — LACTULOSE 10 GM/15ML PO SOLN: 10.0000 g | Freq: Three times a day (TID) | ORAL | 2 refills | Status: DC

## 2017-01-10 MED ORDER — GUAIFENESIN 100 MG/5ML PO SOLN: 5.0000 mL | ORAL | 3 refills | Status: DC | PRN

## 2017-01-10 MED ORDER — SODIUM CHLORIDE 0.9 % IJ SOLN
10.0000 mL | INTRAMUSCULAR | Status: DC | PRN
  Administered 2017-01-10: 10 mL
  Filled 2017-01-10: qty 10

## 2017-01-10 MED FILL — LACTULOSE 10 GM/15 ML SOLN: 10 | 10 days supply | Qty: 473 | Fill #0

## 2017-01-10 NOTE — Progress Notes (Signed)
RN visit only. 

## 2017-01-10 NOTE — Progress Notes (Signed)
C/o heartburn this morning. Given Pepcid IV  Also c/o pain in throat/neck. Rated at '4' . Given IV dilaudid 2mg . Denies nausea today.  Heartburn relived but had 1 episode of vomiting-small amount.  Declined nausea medicine. Pain relieved with dilaudid.  Asked for liquid stool softner-prescription sent in for lactulose per Dr. Alvy Bimler order.

## 2017-01-10 NOTE — Patient Instructions (Signed)
Dehydration, Adult Dehydration is a condition in which there is not enough fluid or water in the body. This happens when you lose more fluids than you take in. Important organs, such as the kidneys, brain, and heart, cannot function without a proper amount of fluids. Any loss of fluids from the body can lead to dehydration. Dehydration can range from mild to severe. This condition should be treated right away to prevent it from becoming severe. What are the causes? This condition may be caused by:  Vomiting.  Diarrhea.  Excessive sweating, such as from heat exposure or exercise.  Not drinking enough fluid, especially:  When ill.  While doing activity that requires a lot of energy.  Excessive urination.  Fever.  Infection.  Certain medicines, such as medicines that cause the body to lose excess fluid (diuretics).  Inability to access safe drinking water.  Reduced physical ability to get adequate water and food. What increases the risk? This condition is more likely to develop in people:  Who have a poorly controlled long-term (chronic) illness, such as diabetes, heart disease, or kidney disease.  Who are age 65 or older.  Who are disabled.  Who live in a place with high altitude.  Who play endurance sports. What are the signs or symptoms? Symptoms of mild dehydration may include:   Thirst.  Dry lips.  Slightly dry mouth.  Dry, warm skin.  Dizziness. Symptoms of moderate dehydration may include:   Very dry mouth.  Muscle cramps.  Dark urine. Urine may be the color of tea.  Decreased urine production.  Decreased tear production.  Heartbeat that is irregular or faster than normal (palpitations).  Headache.  Light-headedness, especially when you stand up from a sitting position.  Fainting (syncope). Symptoms of severe dehydration may include:   Changes in skin, such as:  Cold and clammy skin.  Blotchy (mottled) or pale skin.  Skin that does  not quickly return to normal after being lightly pinched and released (poor skin turgor).  Changes in body fluids, such as:  Extreme thirst.  No tear production.  Inability to sweat when body temperature is high, such as in hot weather.  Very little urine production.  Changes in vital signs, such as:  Weak pulse.  Pulse that is more than 100 beats a minute when sitting still.  Rapid breathing.  Low blood pressure.  Other changes, such as:  Sunken eyes.  Cold hands and feet.  Confusion.  Lack of energy (lethargy).  Difficulty waking up from sleep.  Short-term weight loss.  Unconsciousness. How is this diagnosed? This condition is diagnosed based on your symptoms and a physical exam. Blood and urine tests may be done to help confirm the diagnosis. How is this treated? Treatment for this condition depends on the severity. Mild or moderate dehydration can often be treated at home. Treatment should be started right away. Do not wait until dehydration becomes severe. Severe dehydration is an emergency and it needs to be treated in a hospital. Treatment for mild dehydration may include:   Drinking more fluids.  Replacing salts and minerals in your blood (electrolytes) that you may have lost. Treatment for moderate dehydration may include:   Drinking an oral rehydration solution (ORS). This is a drink that helps you replace fluids and electrolytes (rehydrate). It can be found at pharmacies and retail stores. Treatment for severe dehydration may include:   Receiving fluids through an IV tube.  Receiving an electrolyte solution through a feeding tube that is   passed through your nose and into your stomach (nasogastric tube, or NG tube).  Correcting any abnormalities in electrolytes.  Treating the underlying cause of dehydration. Follow these instructions at home:  If directed by your health care provider, drink an ORS:  Make an ORS by following instructions on the  package.  Start by drinking small amounts, about  cup (120 mL) every 5-10 minutes.  Slowly increase how much you drink until you have taken the amount recommended by your health care provider.  Drink enough clear fluid to keep your urine clear or pale yellow. If you were told to drink an ORS, finish the ORS first, then start slowly drinking other clear fluids. Drink fluids such as:  Water. Do not drink only water. Doing that can lead to having too little salt (sodium) in the body (hyponatremia).  Ice chips.  Fruit juice that you have added water to (diluted fruit juice).  Low-calorie sports drinks.  Avoid:  Alcohol.  Drinks that contain a lot of sugar. These include high-calorie sports drinks, fruit juice that is not diluted, and soda.  Caffeine.  Foods that are greasy or contain a lot of fat or sugar.  Take over-the-counter and prescription medicines only as told by your health care provider.  Do not take sodium tablets. This can lead to having too much sodium in the body (hypernatremia).  Eat foods that contain a healthy balance of electrolytes, such as bananas, oranges, potatoes, tomatoes, and spinach.  Keep all follow-up visits as told by your health care provider. This is important. Contact a health care provider if:  You have abdominal pain that:  Gets worse.  Stays in one area (localizes).  You have a rash.  You have a stiff neck.  You are more irritable than usual.  You are sleepier or more difficult to wake up than usual.  You feel weak or dizzy.  You feel very thirsty.  You have urinated only a small amount of very dark urine over 6-8 hours. Get help right away if:  You have symptoms of severe dehydration.  You cannot drink fluids without vomiting.  Your symptoms get worse with treatment.  You have a fever.  You have a severe headache.  You have vomiting or diarrhea that:  Gets worse.  Does not go away.  You have blood or green matter  (bile) in your vomit.  You have blood in your stool. This may cause stool to look black and tarry.  You have not urinated in 6-8 hours.  You faint.  Your heart rate while sitting still is over 100 beats a minute.  You have trouble breathing. This information is not intended to replace advice given to you by your health care provider. Make sure you discuss any questions you have with your health care provider. Document Released: 11/28/2005 Document Revised: 06/24/2016 Document Reviewed: 01/22/2016 Elsevier Interactive Patient Education  2017 [employer]  

## 2017-01-11 ENCOUNTER — Ambulatory Visit (HOSPITAL_BASED_OUTPATIENT_CLINIC_OR_DEPARTMENT_OTHER): Payer: Medicaid Other | Admitting: Nurse Practitioner

## 2017-01-11 ENCOUNTER — Encounter: Payer: Self-pay | Admitting: Nurse Practitioner

## 2017-01-11 VITALS — BP 131/69 | HR 75 | Temp 98.5°F | Resp 17 | Ht 70.0 in | Wt 171.1 lb

## 2017-01-11 DIAGNOSIS — R110 Nausea: Secondary | ICD-10-CM | POA: Diagnosis not present

## 2017-01-11 DIAGNOSIS — C321 Malignant neoplasm of supraglottis: Secondary | ICD-10-CM | POA: Diagnosis not present

## 2017-01-11 MED ORDER — HYDROMORPHONE HCL 4 MG/ML IJ SOLN
INTRAMUSCULAR | Status: AC
  Filled 2017-01-11: qty 1

## 2017-01-11 MED ORDER — ONDANSETRON HCL 4 MG/2ML IJ SOLN
INTRAMUSCULAR | Status: AC
  Filled 2017-01-11: qty 4

## 2017-01-11 MED ORDER — SODIUM CHLORIDE 0.9 % IV SOLN: Freq: Once | INTRAVENOUS | Status: DC

## 2017-01-11 MED ORDER — SODIUM CHLORIDE 0.9 % IJ SOLN
10.0000 mL | INTRAMUSCULAR | Status: DC | PRN
  Administered 2017-01-11: 10 mL
  Filled 2017-01-11: qty 10

## 2017-01-11 MED ORDER — ONDANSETRON HCL 4 MG/2ML IJ SOLN
8.0000 mg | Freq: Once | INTRAMUSCULAR | Status: AC
  Administered 2017-01-11: 8 mg via INTRAVENOUS

## 2017-01-11 MED ORDER — ALUM & MAG HYDROXIDE-SIMETH 200-200-20 MG/5ML PO SUSP
30.0000 mL | Freq: Once | ORAL | Status: AC
  Administered 2017-01-11: 30 mL via ORAL
  Filled 2017-01-11: qty 30

## 2017-01-11 MED ORDER — FAMOTIDINE IN NACL 20-0.9 MG/50ML-% IV SOLN
20.0000 mg | Freq: Once | INTRAVENOUS | Status: AC
  Administered 2017-01-11: 20 mg via INTRAVENOUS

## 2017-01-11 MED ORDER — HYDROMORPHONE HCL 4 MG/ML IJ SOLN
2.0000 mg | INTRAMUSCULAR | Status: DC | PRN
  Administered 2017-01-11: 2 mg via INTRAVENOUS

## 2017-01-11 MED ORDER — SODIUM CHLORIDE 0.9 % IV SOLN
Freq: Once | INTRAVENOUS | Status: AC
  Administered 2017-01-11: 10:00:00 via INTRAVENOUS

## 2017-01-11 MED ORDER — FAMOTIDINE IN NACL 20-0.9 MG/50ML-% IV SOLN
INTRAVENOUS | Status: AC
  Filled 2017-01-11: qty 50

## 2017-01-11 MED ORDER — ONDANSETRON HCL 8 MG PO TABS
ORAL_TABLET | ORAL | Status: AC
  Filled 2017-01-11: qty 1

## 2017-01-11 MED ORDER — DEXAMETHASONE SODIUM PHOSPHATE 10 MG/ML IJ SOLN
INTRAMUSCULAR | Status: AC
  Filled 2017-01-11: qty 1

## 2017-01-11 MED ORDER — HEPARIN SOD (PORK) LOCK FLUSH 100 UNIT/ML IV SOLN
500.0000 [IU] | Freq: Once | INTRAVENOUS | Status: AC | PRN
  Administered 2017-01-11: 500 [IU]
  Filled 2017-01-11: qty 5

## 2017-01-11 NOTE — Progress Notes (Signed)
RN visit only. 

## 2017-01-11 NOTE — Patient Instructions (Signed)
Dehydration, Adult Dehydration is a condition in which there is not enough fluid or water in the body. This happens when you lose more fluids than you take in. Important organs, such as the kidneys, brain, and heart, cannot function without a proper amount of fluids. Any loss of fluids from the body can lead to dehydration. Dehydration can range from mild to severe. This condition should be treated right away to prevent it from becoming severe. What are the causes? This condition may be caused by:  Vomiting.  Diarrhea.  Excessive sweating, such as from heat exposure or exercise.  Not drinking enough fluid, especially:  When ill.  While doing activity that requires a lot of energy.  Excessive urination.  Fever.  Infection.  Certain medicines, such as medicines that cause the body to lose excess fluid (diuretics).  Inability to access safe drinking water.  Reduced physical ability to get adequate water and food. What increases the risk? This condition is more likely to develop in people:  Who have a poorly controlled long-term (chronic) illness, such as diabetes, heart disease, or kidney disease.  Who are age 65 or older.  Who are disabled.  Who live in a place with high altitude.  Who play endurance sports. What are the signs or symptoms? Symptoms of mild dehydration may include:   Thirst.  Dry lips.  Slightly dry mouth.  Dry, warm skin.  Dizziness. Symptoms of moderate dehydration may include:   Very dry mouth.  Muscle cramps.  Dark urine. Urine may be the color of tea.  Decreased urine production.  Decreased tear production.  Heartbeat that is irregular or faster than normal (palpitations).  Headache.  Light-headedness, especially when you stand up from a sitting position.  Fainting (syncope). Symptoms of severe dehydration may include:   Changes in skin, such as:  Cold and clammy skin.  Blotchy (mottled) or pale skin.  Skin that does  not quickly return to normal after being lightly pinched and released (poor skin turgor).  Changes in body fluids, such as:  Extreme thirst.  No tear production.  Inability to sweat when body temperature is high, such as in hot weather.  Very little urine production.  Changes in vital signs, such as:  Weak pulse.  Pulse that is more than 100 beats a minute when sitting still.  Rapid breathing.  Low blood pressure.  Other changes, such as:  Sunken eyes.  Cold hands and feet.  Confusion.  Lack of energy (lethargy).  Difficulty waking up from sleep.  Short-term weight loss.  Unconsciousness. How is this diagnosed? This condition is diagnosed based on your symptoms and a physical exam. Blood and urine tests may be done to help confirm the diagnosis. How is this treated? Treatment for this condition depends on the severity. Mild or moderate dehydration can often be treated at home. Treatment should be started right away. Do not wait until dehydration becomes severe. Severe dehydration is an emergency and it needs to be treated in a hospital. Treatment for mild dehydration may include:   Drinking more fluids.  Replacing salts and minerals in your blood (electrolytes) that you may have lost. Treatment for moderate dehydration may include:   Drinking an oral rehydration solution (ORS). This is a drink that helps you replace fluids and electrolytes (rehydrate). It can be found at pharmacies and retail stores. Treatment for severe dehydration may include:   Receiving fluids through an IV tube.  Receiving an electrolyte solution through a feeding tube that is   passed through your nose and into your stomach (nasogastric tube, or NG tube).  Correcting any abnormalities in electrolytes.  Treating the underlying cause of dehydration. Follow these instructions at home:  If directed by your health care provider, drink an ORS:  Make an ORS by following instructions on the  package.  Start by drinking small amounts, about  cup (120 mL) every 5-10 minutes.  Slowly increase how much you drink until you have taken the amount recommended by your health care provider.  Drink enough clear fluid to keep your urine clear or pale yellow. If you were told to drink an ORS, finish the ORS first, then start slowly drinking other clear fluids. Drink fluids such as:  Water. Do not drink only water. Doing that can lead to having too little salt (sodium) in the body (hyponatremia).  Ice chips.  Fruit juice that you have added water to (diluted fruit juice).  Low-calorie sports drinks.  Avoid:  Alcohol.  Drinks that contain a lot of sugar. These include high-calorie sports drinks, fruit juice that is not diluted, and soda.  Caffeine.  Foods that are greasy or contain a lot of fat or sugar.  Take over-the-counter and prescription medicines only as told by your health care provider.  Do not take sodium tablets. This can lead to having too much sodium in the body (hypernatremia).  Eat foods that contain a healthy balance of electrolytes, such as bananas, oranges, potatoes, tomatoes, and spinach.  Keep all follow-up visits as told by your health care provider. This is important. Contact a health care provider if:  You have abdominal pain that:  Gets worse.  Stays in one area (localizes).  You have a rash.  You have a stiff neck.  You are more irritable than usual.  You are sleepier or more difficult to wake up than usual.  You feel weak or dizzy.  You feel very thirsty.  You have urinated only a small amount of very dark urine over 6-8 hours. Get help right away if:  You have symptoms of severe dehydration.  You cannot drink fluids without vomiting.  Your symptoms get worse with treatment.  You have a fever.  You have a severe headache.  You have vomiting or diarrhea that:  Gets worse.  Does not go away.  You have blood or green matter  (bile) in your vomit.  You have blood in your stool. This may cause stool to look black and tarry.  You have not urinated in 6-8 hours.  You faint.  Your heart rate while sitting still is over 100 beats a minute.  You have trouble breathing. This information is not intended to replace advice given to you by your health care provider. Make sure you discuss any questions you have with your health care provider. Document Released: 11/28/2005 Document Revised: 06/24/2016 Document Reviewed: 01/22/2016 Elsevier Interactive Patient Education  2017 [employer]  

## 2017-01-16 ENCOUNTER — Ambulatory Visit: Payer: Self-pay

## 2017-01-16 ENCOUNTER — Other Ambulatory Visit: Payer: Self-pay

## 2017-01-17 MED FILL — LEVOTHYROXINE 50 MCG TABLET: 50 | 30 days supply | Qty: 30 | Fill #5

## 2017-01-23 ENCOUNTER — Ambulatory Visit: Payer: Medicaid Other

## 2017-01-23 ENCOUNTER — Other Ambulatory Visit (HOSPITAL_BASED_OUTPATIENT_CLINIC_OR_DEPARTMENT_OTHER): Payer: Medicaid Other

## 2017-01-23 ENCOUNTER — Ambulatory Visit (HOSPITAL_BASED_OUTPATIENT_CLINIC_OR_DEPARTMENT_OTHER): Payer: Medicaid Other

## 2017-01-23 VITALS — BP 123/88 | HR 97 | Temp 100.1°F | Resp 18

## 2017-01-23 DIAGNOSIS — C321 Malignant neoplasm of supraglottis: Secondary | ICD-10-CM | POA: Diagnosis present

## 2017-01-23 DIAGNOSIS — Z79899 Other long term (current) drug therapy: Secondary | ICD-10-CM | POA: Diagnosis not present

## 2017-01-23 DIAGNOSIS — R5383 Other fatigue: Secondary | ICD-10-CM

## 2017-01-23 DIAGNOSIS — Z5111 Encounter for antineoplastic chemotherapy: Secondary | ICD-10-CM

## 2017-01-23 LAB — COMPREHENSIVE METABOLIC PANEL
ALBUMIN: 3.6 g/dL (ref 3.5–5.0)
ALK PHOS: 58 U/L (ref 40–150)
ALT: 10 U/L (ref 0–55)
ANION GAP: 9 meq/L (ref 3–11)
AST: 12 U/L (ref 5–34)
BILIRUBIN TOTAL: 0.31 mg/dL (ref 0.20–1.20)
BUN: 11.4 mg/dL (ref 7.0–26.0)
CO2: 30 mEq/L — ABNORMAL HIGH (ref 22–29)
Calcium: 9.8 mg/dL (ref 8.4–10.4)
Chloride: 101 mEq/L (ref 98–109)
Creatinine: 0.8 mg/dL (ref 0.7–1.3)
GLUCOSE: 141 mg/dL — AB (ref 70–140)
POTASSIUM: 3.9 meq/L (ref 3.5–5.1)
SODIUM: 140 meq/L (ref 136–145)
TOTAL PROTEIN: 6.7 g/dL (ref 6.4–8.3)

## 2017-01-23 LAB — CBC WITH DIFFERENTIAL/PLATELET
BASO%: 0.2 % (ref 0.0–2.0)
Basophils Absolute: 0 10*3/uL (ref 0.0–0.1)
EOS%: 0.5 % (ref 0.0–7.0)
Eosinophils Absolute: 0 10*3/uL (ref 0.0–0.5)
HCT: 35.1 % — ABNORMAL LOW (ref 38.4–49.9)
HGB: 12.1 g/dL — ABNORMAL LOW (ref 13.0–17.1)
LYMPH%: 6 % — AB (ref 14.0–49.0)
MCH: 34.3 pg — ABNORMAL HIGH (ref 27.2–33.4)
MCHC: 34.4 g/dL (ref 32.0–36.0)
MCV: 99.5 fL — ABNORMAL HIGH (ref 79.3–98.0)
MONO#: 0.8 10*3/uL (ref 0.1–0.9)
MONO%: 10.1 % (ref 0.0–14.0)
NEUT%: 83.2 % — AB (ref 39.0–75.0)
NEUTROS ABS: 6.5 10*3/uL (ref 1.5–6.5)
Platelets: 214 10*3/uL (ref 140–400)
RBC: 3.52 10*6/uL — AB (ref 4.20–5.82)
RDW: 15.6 % — ABNORMAL HIGH (ref 11.0–14.6)
WBC: 7.8 10*3/uL (ref 4.0–10.3)
lymph#: 0.5 10*3/uL — ABNORMAL LOW (ref 0.9–3.3)

## 2017-01-23 LAB — TECHNOLOGIST REVIEW: Technologist Review: 2

## 2017-01-23 LAB — TSH: TSH: 3.424 m[IU]/L (ref 0.320–4.118)

## 2017-01-23 MED ORDER — DIPHENHYDRAMINE HCL 50 MG/ML IJ SOLN
INTRAMUSCULAR | Status: AC
  Filled 2017-01-23: qty 1

## 2017-01-23 MED ORDER — FAMOTIDINE IN NACL 20-0.9 MG/50ML-% IV SOLN
20.0000 mg | Freq: Once | INTRAVENOUS | Status: AC
  Administered 2017-01-23: 20 mg via INTRAVENOUS

## 2017-01-23 MED ORDER — SODIUM CHLORIDE 0.9 % IV SOLN
Freq: Once | INTRAVENOUS | Status: AC
  Administered 2017-01-23: 10:00:00 via INTRAVENOUS
  Filled 2017-01-23: qty 5

## 2017-01-23 MED ORDER — HEPARIN SOD (PORK) LOCK FLUSH 100 UNIT/ML IV SOLN
500.0000 [IU] | Freq: Once | INTRAVENOUS | Status: AC | PRN
  Administered 2017-01-23: 500 [IU]
  Filled 2017-01-23: qty 5

## 2017-01-23 MED ORDER — PACLITAXEL CHEMO INJECTION 300 MG/50ML
80.0000 mg/m2 | Freq: Once | INTRAVENOUS | Status: AC
  Administered 2017-01-23: 156 mg via INTRAVENOUS
  Filled 2017-01-23: qty 26

## 2017-01-23 MED ORDER — SODIUM CHLORIDE 0.9 % IV SOLN
Freq: Once | INTRAVENOUS | Status: AC
  Administered 2017-01-23: 09:00:00 via INTRAVENOUS

## 2017-01-23 MED ORDER — FAMOTIDINE IN NACL 20-0.9 MG/50ML-% IV SOLN
INTRAVENOUS | Status: AC
  Filled 2017-01-23: qty 50

## 2017-01-23 MED ORDER — PALONOSETRON HCL INJECTION 0.25 MG/5ML
0.2500 mg | Freq: Once | INTRAVENOUS | Status: AC
Start: 2017-01-23 — End: 2017-01-23
  Administered 2017-01-23: 0.25 mg via INTRAVENOUS

## 2017-01-23 MED ORDER — PALONOSETRON HCL INJECTION 0.25 MG/5ML
INTRAVENOUS | Status: AC
  Filled 2017-01-23: qty 5

## 2017-01-23 MED ORDER — DIPHENHYDRAMINE HCL 50 MG/ML IJ SOLN
50.0000 mg | Freq: Once | INTRAMUSCULAR | Status: AC
  Administered 2017-01-23: 50 mg via INTRAVENOUS

## 2017-01-23 MED ORDER — SODIUM CHLORIDE 0.9 % IV SOLN: 20.0000 mg | Freq: Once | INTRAVENOUS | Status: DC

## 2017-01-23 MED ORDER — SODIUM CHLORIDE 0.9 % IJ SOLN
10.0000 mL | INTRAMUSCULAR | Status: DC | PRN
  Administered 2017-01-23: 10 mL
  Filled 2017-01-23: qty 10

## 2017-01-23 MED ORDER — SODIUM CHLORIDE 0.9% FLUSH
10.0000 mL | INTRAVENOUS | Status: DC | PRN
  Administered 2017-01-23: 10 mL
  Filled 2017-01-23: qty 10

## 2017-01-23 NOTE — Patient Instructions (Signed)
[facility] Discharge Instructions for Patients Receiving Chemotherapy  Today you received the following chemotherapy agents Taxol   To help prevent nausea and vomiting after your treatment, we encourage you to take your nausea medication as directed.   If you develop nausea and vomiting that is not controlled by your nausea medication, call the clinic.   BELOW ARE SYMPTOMS THAT SHOULD BE REPORTED IMMEDIATELY:  *FEVER GREATER THAN 100.5 F  *CHILLS WITH OR WITHOUT FEVER  NAUSEA AND VOMITING THAT IS NOT CONTROLLED WITH YOUR NAUSEA MEDICATION  *UNUSUAL SHORTNESS OF BREATH  *UNUSUAL BRUISING OR BLEEDING  TENDERNESS IN MOUTH AND THROAT WITH OR WITHOUT PRESENCE OF ULCERS  *URINARY PROBLEMS  *BOWEL PROBLEMS  UNUSUAL RASH Items with * indicate a potential emergency and should be followed up as soon as possible.  Feel free to call the clinic you have any questions or concerns. The clinic phone number is [phone].  Please show the CHEMO ALERT CARD at check-in to the Emergency Department and triage nurse.   

## 2017-01-23 NOTE — Progress Notes (Signed)
Dr Alvy Bimler aware of 100.1 temperature this AM. ok to treat. No new orders.

## 2017-01-24 ENCOUNTER — Ambulatory Visit (HOSPITAL_BASED_OUTPATIENT_CLINIC_OR_DEPARTMENT_OTHER): Payer: Medicaid Other | Admitting: Nurse Practitioner

## 2017-01-24 ENCOUNTER — Ambulatory Visit: Payer: Medicaid Other | Admitting: Nutrition

## 2017-01-24 VITALS — BP 118/72 | HR 87 | Temp 98.5°F | Resp 18 | Ht 70.0 in | Wt 170.4 lb

## 2017-01-24 DIAGNOSIS — R634 Abnormal weight loss: Secondary | ICD-10-CM

## 2017-01-24 DIAGNOSIS — C321 Malignant neoplasm of supraglottis: Secondary | ICD-10-CM

## 2017-01-24 MED ORDER — HYDROMORPHONE HCL 4 MG/ML IJ SOLN
INTRAMUSCULAR | Status: AC
  Filled 2017-01-24: qty 1

## 2017-01-24 MED ORDER — SODIUM CHLORIDE 0.9 % IV SOLN
Freq: Once | INTRAVENOUS | Status: AC
  Administered 2017-01-24: 12:00:00 via INTRAVENOUS

## 2017-01-24 MED ORDER — SODIUM CHLORIDE 0.9 % IJ SOLN
10.0000 mL | INTRAMUSCULAR | Status: DC | PRN
  Administered 2017-01-24: 10 mL
  Filled 2017-01-24: qty 10

## 2017-01-24 MED ORDER — HYDROMORPHONE HCL 4 MG/ML IJ SOLN
2.0000 mg | Freq: Once | INTRAMUSCULAR | Status: AC
  Administered 2017-01-24: 2 mg via INTRAVENOUS

## 2017-01-24 MED ORDER — HEPARIN SOD (PORK) LOCK FLUSH 100 UNIT/ML IV SOLN
500.0000 [IU] | Freq: Once | INTRAVENOUS | Status: AC | PRN
  Administered 2017-01-24: 500 [IU]
  Filled 2017-01-24: qty 5

## 2017-01-24 NOTE — Patient Instructions (Signed)
Dehydration, Adult Dehydration is a condition in which there is not enough fluid or water in the body. This happens when you lose more fluids than you take in. Important organs, such as the kidneys, brain, and heart, cannot function without a proper amount of fluids. Any loss of fluids from the body can lead to dehydration. Dehydration can range from mild to severe. This condition should be treated right away to prevent it from becoming severe. What are the causes? This condition may be caused by:  Vomiting.  Diarrhea.  Excessive sweating, such as from heat exposure or exercise.  Not drinking enough fluid, especially:  When ill.  While doing activity that requires a lot of energy.  Excessive urination.  Fever.  Infection.  Certain medicines, such as medicines that cause the body to lose excess fluid (diuretics).  Inability to access safe drinking water.  Reduced physical ability to get adequate water and food. What increases the risk? This condition is more likely to develop in people:  Who have a poorly controlled long-term (chronic) illness, such as diabetes, heart disease, or kidney disease.  Who are age 65 or older.  Who are disabled.  Who live in a place with high altitude.  Who play endurance sports. What are the signs or symptoms? Symptoms of mild dehydration may include:   Thirst.  Dry lips.  Slightly dry mouth.  Dry, warm skin.  Dizziness. Symptoms of moderate dehydration may include:   Very dry mouth.  Muscle cramps.  Dark urine. Urine may be the color of tea.  Decreased urine production.  Decreased tear production.  Heartbeat that is irregular or faster than normal (palpitations).  Headache.  Light-headedness, especially when you stand up from a sitting position.  Fainting (syncope). Symptoms of severe dehydration may include:   Changes in skin, such as:  Cold and clammy skin.  Blotchy (mottled) or pale skin.  Skin that does  not quickly return to normal after being lightly pinched and released (poor skin turgor).  Changes in body fluids, such as:  Extreme thirst.  No tear production.  Inability to sweat when body temperature is high, such as in hot weather.  Very little urine production.  Changes in vital signs, such as:  Weak pulse.  Pulse that is more than 100 beats a minute when sitting still.  Rapid breathing.  Low blood pressure.  Other changes, such as:  Sunken eyes.  Cold hands and feet.  Confusion.  Lack of energy (lethargy).  Difficulty waking up from sleep.  Short-term weight loss.  Unconsciousness. How is this diagnosed? This condition is diagnosed based on your symptoms and a physical exam. Blood and urine tests may be done to help confirm the diagnosis. How is this treated? Treatment for this condition depends on the severity. Mild or moderate dehydration can often be treated at home. Treatment should be started right away. Do not wait until dehydration becomes severe. Severe dehydration is an emergency and it needs to be treated in a hospital. Treatment for mild dehydration may include:   Drinking more fluids.  Replacing salts and minerals in your blood (electrolytes) that you may have lost. Treatment for moderate dehydration may include:   Drinking an oral rehydration solution (ORS). This is a drink that helps you replace fluids and electrolytes (rehydrate). It can be found at pharmacies and retail stores. Treatment for severe dehydration may include:   Receiving fluids through an IV tube.  Receiving an electrolyte solution through a feeding tube that is   passed through your nose and into your stomach (nasogastric tube, or NG tube).  Correcting any abnormalities in electrolytes.  Treating the underlying cause of dehydration. Follow these instructions at home:  If directed by your health care provider, drink an ORS:  Make an ORS by following instructions on the  package.  Start by drinking small amounts, about  cup (120 mL) every 5-10 minutes.  Slowly increase how much you drink until you have taken the amount recommended by your health care provider.  Drink enough clear fluid to keep your urine clear or pale yellow. If you were told to drink an ORS, finish the ORS first, then start slowly drinking other clear fluids. Drink fluids such as:  Water. Do not drink only water. Doing that can lead to having too little salt (sodium) in the body (hyponatremia).  Ice chips.  Fruit juice that you have added water to (diluted fruit juice).  Low-calorie sports drinks.  Avoid:  Alcohol.  Drinks that contain a lot of sugar. These include high-calorie sports drinks, fruit juice that is not diluted, and soda.  Caffeine.  Foods that are greasy or contain a lot of fat or sugar.  Take over-the-counter and prescription medicines only as told by your health care provider.  Do not take sodium tablets. This can lead to having too much sodium in the body (hypernatremia).  Eat foods that contain a healthy balance of electrolytes, such as bananas, oranges, potatoes, tomatoes, and spinach.  Keep all follow-up visits as told by your health care provider. This is important. Contact a health care provider if:  You have abdominal pain that:  Gets worse.  Stays in one area (localizes).  You have a rash.  You have a stiff neck.  You are more irritable than usual.  You are sleepier or more difficult to wake up than usual.  You feel weak or dizzy.  You feel very thirsty.  You have urinated only a small amount of very dark urine over 6-8 hours. Get help right away if:  You have symptoms of severe dehydration.  You cannot drink fluids without vomiting.  Your symptoms get worse with treatment.  You have a fever.  You have a severe headache.  You have vomiting or diarrhea that:  Gets worse.  Does not go away.  You have blood or green matter  (bile) in your vomit.  You have blood in your stool. This may cause stool to look black and tarry.  You have not urinated in 6-8 hours.  You faint.  Your heart rate while sitting still is over 100 beats a minute.  You have trouble breathing. This information is not intended to replace advice given to you by your health care provider. Make sure you discuss any questions you have with your health care provider. Document Released: 11/28/2005 Document Revised: 06/24/2016 Document Reviewed: 01/22/2016 Elsevier Interactive Patient Education  2017 [employer]  

## 2017-01-24 NOTE — Progress Notes (Signed)
RN visit only for IV fluids. 

## 2017-01-24 NOTE — Progress Notes (Signed)
Nutrition follow-up completed with patient during IV fluids. Weight has improved and was documented as 170.4 pounds on February 13 increased from 166.4 pounds January 22. Noted glucose 141. Patient has no nutrition complaints today. Patient is tolerating between 6 and 8 cans of Osmolite 1.5 and Jevity 1.5 daily. 8 cans of Tube feeding providing 2560 cal, 113 g protein, 2220 L free water  Nutrition diagnosis: Unintentional weight loss improved.  Intervention:  Educated patient to continue tube feeding to promote weight gain./Weight maintenance. Teach back method used.  Monitoring, evaluation, goals:  Patient will tolerate tube feeding for weight maintenance.  Next visit: I will follow-up with patient as needed.  **Disclaimer: This note was dictated with voice recognition software. Similar sounding words can inadvertently be transcribed and this note may contain transcription errors which may not have been corrected upon publication of note.**

## 2017-01-25 ENCOUNTER — Ambulatory Visit (HOSPITAL_BASED_OUTPATIENT_CLINIC_OR_DEPARTMENT_OTHER): Payer: Medicaid Other | Admitting: Nurse Practitioner

## 2017-01-25 VITALS — BP 125/77 | HR 69 | Temp 99.1°F | Resp 18 | Ht 70.0 in | Wt 172.0 lb

## 2017-01-25 DIAGNOSIS — C321 Malignant neoplasm of supraglottis: Secondary | ICD-10-CM

## 2017-01-25 DIAGNOSIS — R634 Abnormal weight loss: Secondary | ICD-10-CM

## 2017-01-25 MED ORDER — FAMOTIDINE IN NACL 20-0.9 MG/50ML-% IV SOLN
20.0000 mg | Freq: Once | INTRAVENOUS | Status: AC
  Administered 2017-01-25: 20 mg via INTRAVENOUS

## 2017-01-25 MED ORDER — SODIUM CHLORIDE 0.9 % IV SOLN
Freq: Once | INTRAVENOUS | Status: AC
  Administered 2017-01-25: 12:00:00 via INTRAVENOUS

## 2017-01-25 MED ORDER — FAMOTIDINE IN NACL 20-0.9 MG/50ML-% IV SOLN
INTRAVENOUS | Status: AC
  Filled 2017-01-25: qty 50

## 2017-01-25 MED ORDER — SODIUM CHLORIDE 0.9 % IJ SOLN
10.0000 mL | INTRAMUSCULAR | Status: DC | PRN
  Administered 2017-01-25: 10 mL
  Filled 2017-01-25: qty 10

## 2017-01-25 MED ORDER — HEPARIN SOD (PORK) LOCK FLUSH 100 UNIT/ML IV SOLN
500.0000 [IU] | Freq: Once | INTRAVENOUS | Status: AC | PRN
  Administered 2017-01-25: 500 [IU]
  Filled 2017-01-25: qty 5

## 2017-01-25 MED ORDER — HYDROMORPHONE HCL 4 MG/ML IJ SOLN
2.0000 mg | INTRAMUSCULAR | Status: DC | PRN
  Administered 2017-01-25: 2 mg via INTRAVENOUS

## 2017-01-25 MED ORDER — HYDROMORPHONE HCL 4 MG/ML IJ SOLN
INTRAMUSCULAR | Status: AC
  Filled 2017-01-25: qty 1

## 2017-01-25 NOTE — Progress Notes (Signed)
RN visit only for IV fluids.  Pt c/o periodic low grade temp and thicker secretions-yellowish in color.  Encouraged to add extra water to tube feedings and to take Robitussin every 4 hours as needed to help thin secretions.  Pt states he has used that before and that it helped.  Discussed the above with Dr. Alvy Bimler. No antibiotics at this time.  Will have pt continue to monitor temperature at home.

## 2017-01-25 NOTE — Patient Instructions (Signed)
Dehydration, Adult Dehydration is a condition in which there is not enough fluid or water in the body. This happens when you lose more fluids than you take in. Important organs, such as the kidneys, brain, and heart, cannot function without a proper amount of fluids. Any loss of fluids from the body can lead to dehydration. Dehydration can range from mild to severe. This condition should be treated right away to prevent it from becoming severe. What are the causes? This condition may be caused by:  Vomiting.  Diarrhea.  Excessive sweating, such as from heat exposure or exercise.  Not drinking enough fluid, especially:  When ill.  While doing activity that requires a lot of energy.  Excessive urination.  Fever.  Infection.  Certain medicines, such as medicines that cause the body to lose excess fluid (diuretics).  Inability to access safe drinking water.  Reduced physical ability to get adequate water and food. What increases the risk? This condition is more likely to develop in people:  Who have a poorly controlled long-term (chronic) illness, such as diabetes, heart disease, or kidney disease.  Who are age 65 or older.  Who are disabled.  Who live in a place with high altitude.  Who play endurance sports. What are the signs or symptoms? Symptoms of mild dehydration may include:   Thirst.  Dry lips.  Slightly dry mouth.  Dry, warm skin.  Dizziness. Symptoms of moderate dehydration may include:   Very dry mouth.  Muscle cramps.  Dark urine. Urine may be the color of tea.  Decreased urine production.  Decreased tear production.  Heartbeat that is irregular or faster than normal (palpitations).  Headache.  Light-headedness, especially when you stand up from a sitting position.  Fainting (syncope). Symptoms of severe dehydration may include:   Changes in skin, such as:  Cold and clammy skin.  Blotchy (mottled) or pale skin.  Skin that does  not quickly return to normal after being lightly pinched and released (poor skin turgor).  Changes in body fluids, such as:  Extreme thirst.  No tear production.  Inability to sweat when body temperature is high, such as in hot weather.  Very little urine production.  Changes in vital signs, such as:  Weak pulse.  Pulse that is more than 100 beats a minute when sitting still.  Rapid breathing.  Low blood pressure.  Other changes, such as:  Sunken eyes.  Cold hands and feet.  Confusion.  Lack of energy (lethargy).  Difficulty waking up from sleep.  Short-term weight loss.  Unconsciousness. How is this diagnosed? This condition is diagnosed based on your symptoms and a physical exam. Blood and urine tests may be done to help confirm the diagnosis. How is this treated? Treatment for this condition depends on the severity. Mild or moderate dehydration can often be treated at home. Treatment should be started right away. Do not wait until dehydration becomes severe. Severe dehydration is an emergency and it needs to be treated in a hospital. Treatment for mild dehydration may include:   Drinking more fluids.  Replacing salts and minerals in your blood (electrolytes) that you may have lost. Treatment for moderate dehydration may include:   Drinking an oral rehydration solution (ORS). This is a drink that helps you replace fluids and electrolytes (rehydrate). It can be found at pharmacies and retail stores. Treatment for severe dehydration may include:   Receiving fluids through an IV tube.  Receiving an electrolyte solution through a feeding tube that is   passed through your nose and into your stomach (nasogastric tube, or NG tube).  Correcting any abnormalities in electrolytes.  Treating the underlying cause of dehydration. Follow these instructions at home:  If directed by your health care provider, drink an ORS:  Make an ORS by following instructions on the  package.  Start by drinking small amounts, about  cup (120 mL) every 5-10 minutes.  Slowly increase how much you drink until you have taken the amount recommended by your health care provider.  Drink enough clear fluid to keep your urine clear or pale yellow. If you were told to drink an ORS, finish the ORS first, then start slowly drinking other clear fluids. Drink fluids such as:  Water. Do not drink only water. Doing that can lead to having too little salt (sodium) in the body (hyponatremia).  Ice chips.  Fruit juice that you have added water to (diluted fruit juice).  Low-calorie sports drinks.  Avoid:  Alcohol.  Drinks that contain a lot of sugar. These include high-calorie sports drinks, fruit juice that is not diluted, and soda.  Caffeine.  Foods that are greasy or contain a lot of fat or sugar.  Take over-the-counter and prescription medicines only as told by your health care provider.  Do not take sodium tablets. This can lead to having too much sodium in the body (hypernatremia).  Eat foods that contain a healthy balance of electrolytes, such as bananas, oranges, potatoes, tomatoes, and spinach.  Keep all follow-up visits as told by your health care provider. This is important. Contact a health care provider if:  You have abdominal pain that:  Gets worse.  Stays in one area (localizes).  You have a rash.  You have a stiff neck.  You are more irritable than usual.  You are sleepier or more difficult to wake up than usual.  You feel weak or dizzy.  You feel very thirsty.  You have urinated only a small amount of very dark urine over 6-8 hours. Get help right away if:  You have symptoms of severe dehydration.  You cannot drink fluids without vomiting.  Your symptoms get worse with treatment.  You have a fever.  You have a severe headache.  You have vomiting or diarrhea that:  Gets worse.  Does not go away.  You have blood or green matter  (bile) in your vomit.  You have blood in your stool. This may cause stool to look black and tarry.  You have not urinated in 6-8 hours.  You faint.  Your heart rate while sitting still is over 100 beats a minute.  You have trouble breathing. This information is not intended to replace advice given to you by your health care provider. Make sure you discuss any questions you have with your health care provider. Document Released: 11/28/2005 Document Revised: 06/24/2016 Document Reviewed: 01/22/2016 Elsevier Interactive Patient Education  2017 [employer]  

## 2017-01-30 ENCOUNTER — Ambulatory Visit (HOSPITAL_BASED_OUTPATIENT_CLINIC_OR_DEPARTMENT_OTHER): Payer: Medicaid Other

## 2017-01-30 ENCOUNTER — Telehealth: Payer: Self-pay | Admitting: *Deleted

## 2017-01-30 ENCOUNTER — Other Ambulatory Visit (HOSPITAL_BASED_OUTPATIENT_CLINIC_OR_DEPARTMENT_OTHER): Payer: Medicaid Other

## 2017-01-30 ENCOUNTER — Ambulatory Visit (HOSPITAL_BASED_OUTPATIENT_CLINIC_OR_DEPARTMENT_OTHER): Payer: Medicaid Other | Admitting: Hematology and Oncology

## 2017-01-30 VITALS — BP 121/83 | HR 79 | Temp 98.9°F

## 2017-01-30 VITALS — BP 115/61 | HR 104 | Temp 99.4°F | Resp 18 | Ht 70.0 in | Wt 164.1 lb

## 2017-01-30 DIAGNOSIS — D649 Anemia, unspecified: Secondary | ICD-10-CM

## 2017-01-30 DIAGNOSIS — R110 Nausea: Secondary | ICD-10-CM

## 2017-01-30 DIAGNOSIS — E039 Hypothyroidism, unspecified: Secondary | ICD-10-CM | POA: Diagnosis not present

## 2017-01-30 DIAGNOSIS — G893 Neoplasm related pain (acute) (chronic): Secondary | ICD-10-CM | POA: Diagnosis not present

## 2017-01-30 DIAGNOSIS — C321 Malignant neoplasm of supraglottis: Secondary | ICD-10-CM

## 2017-01-30 DIAGNOSIS — R109 Unspecified abdominal pain: Secondary | ICD-10-CM | POA: Insufficient documentation

## 2017-01-30 DIAGNOSIS — R1033 Periumbilical pain: Secondary | ICD-10-CM

## 2017-01-30 DIAGNOSIS — T451X5A Adverse effect of antineoplastic and immunosuppressive drugs, initial encounter: Secondary | ICD-10-CM

## 2017-01-30 DIAGNOSIS — D6481 Anemia due to antineoplastic chemotherapy: Secondary | ICD-10-CM

## 2017-01-30 DIAGNOSIS — Z95828 Presence of other vascular implants and grafts: Secondary | ICD-10-CM

## 2017-01-30 DIAGNOSIS — Z5111 Encounter for antineoplastic chemotherapy: Secondary | ICD-10-CM

## 2017-01-30 DIAGNOSIS — R5383 Other fatigue: Secondary | ICD-10-CM

## 2017-01-30 DIAGNOSIS — Z430 Encounter for attention to tracheostomy: Secondary | ICD-10-CM

## 2017-01-30 DIAGNOSIS — R634 Abnormal weight loss: Secondary | ICD-10-CM | POA: Insufficient documentation

## 2017-01-30 LAB — COMPREHENSIVE METABOLIC PANEL
ALBUMIN: 3.9 g/dL (ref 3.5–5.0)
ALK PHOS: 118 U/L (ref 40–150)
ALT: 41 U/L (ref 0–55)
ANION GAP: 11 meq/L (ref 3–11)
AST: 17 U/L (ref 5–34)
BILIRUBIN TOTAL: 0.36 mg/dL (ref 0.20–1.20)
BUN: 16.7 mg/dL (ref 7.0–26.0)
CALCIUM: 10.3 mg/dL (ref 8.4–10.4)
CO2: 26 mEq/L (ref 22–29)
Chloride: 102 mEq/L (ref 98–109)
Creatinine: 0.8 mg/dL (ref 0.7–1.3)
Glucose: 104 mg/dl (ref 70–140)
POTASSIUM: 3.9 meq/L (ref 3.5–5.1)
Sodium: 138 mEq/L (ref 136–145)
TOTAL PROTEIN: 7.3 g/dL (ref 6.4–8.3)

## 2017-01-30 LAB — CBC WITH DIFFERENTIAL/PLATELET
BASO%: 0.2 % (ref 0.0–2.0)
BASOS ABS: 0 10*3/uL (ref 0.0–0.1)
EOS ABS: 0.1 10*3/uL (ref 0.0–0.5)
EOS%: 1.1 % (ref 0.0–7.0)
HEMATOCRIT: 35.6 % — AB (ref 38.4–49.9)
HEMOGLOBIN: 12.2 g/dL — AB (ref 13.0–17.1)
LYMPH%: 6.8 % — ABNORMAL LOW (ref 14.0–49.0)
MCH: 33.6 pg — AB (ref 27.2–33.4)
MCHC: 34.3 g/dL (ref 32.0–36.0)
MCV: 98.1 fL — AB (ref 79.3–98.0)
MONO#: 0.8 10*3/uL (ref 0.1–0.9)
MONO%: 7.7 % (ref 0.0–14.0)
NEUT%: 84.2 % — ABNORMAL HIGH (ref 39.0–75.0)
NEUTROS ABS: 8.8 10*3/uL — AB (ref 1.5–6.5)
PLATELETS: 208 10*3/uL (ref 140–400)
RBC: 3.63 10*6/uL — ABNORMAL LOW (ref 4.20–5.82)
RDW: 15.2 % — AB (ref 11.0–14.6)
WBC: 10.4 10*3/uL — AB (ref 4.0–10.3)
lymph#: 0.7 10*3/uL — ABNORMAL LOW (ref 0.9–3.3)

## 2017-01-30 LAB — TECHNOLOGIST REVIEW

## 2017-01-30 LAB — TSH: TSH: 4.401 m[IU]/L — AB (ref 0.320–4.118)

## 2017-01-30 MED ORDER — PACLITAXEL CHEMO INJECTION 300 MG/50ML
80.0000 mg/m2 | Freq: Once | INTRAVENOUS | Status: AC
  Administered 2017-01-30: 156 mg via INTRAVENOUS
  Filled 2017-01-30: qty 26

## 2017-01-30 MED ORDER — DIPHENHYDRAMINE HCL 50 MG/ML IJ SOLN
INTRAMUSCULAR | Status: AC
  Filled 2017-01-30: qty 1

## 2017-01-30 MED ORDER — SODIUM CHLORIDE 0.9 % IV SOLN
Freq: Once | INTRAVENOUS | Status: AC
  Administered 2017-01-30: 15:00:00 via INTRAVENOUS
  Filled 2017-01-30: qty 5

## 2017-01-30 MED ORDER — PALONOSETRON HCL INJECTION 0.25 MG/5ML
INTRAVENOUS | Status: AC
  Filled 2017-01-30: qty 5

## 2017-01-30 MED ORDER — HEPARIN SOD (PORK) LOCK FLUSH 100 UNIT/ML IV SOLN
500.0000 [IU] | Freq: Once | INTRAVENOUS | Status: AC | PRN
Start: 2017-01-30 — End: 2017-01-30
  Administered 2017-01-30: 500 [IU]
  Filled 2017-01-30: qty 5

## 2017-01-30 MED ORDER — SODIUM CHLORIDE 0.9% FLUSH
10.0000 mL | INTRAVENOUS | Status: DC | PRN
  Administered 2017-01-30: 10 mL via INTRAVENOUS
  Filled 2017-01-30: qty 10

## 2017-01-30 MED ORDER — ONDANSETRON HCL 4 MG/2ML IJ SOLN
INTRAMUSCULAR | Status: AC
  Filled 2017-01-30: qty 4

## 2017-01-30 MED ORDER — DIPHENHYDRAMINE HCL 50 MG/ML IJ SOLN
50.0000 mg | Freq: Once | INTRAMUSCULAR | Status: AC
  Administered 2017-01-30: 50 mg via INTRAVENOUS

## 2017-01-30 MED ORDER — HYDROMORPHONE HCL 4 MG/ML IJ SOLN
INTRAMUSCULAR | Status: AC
  Filled 2017-01-30: qty 1

## 2017-01-30 MED ORDER — HYDROMORPHONE HCL 4 MG/ML IJ SOLN
2.0000 mg | Freq: Once | INTRAMUSCULAR | Status: AC
  Administered 2017-01-30: 2 mg via INTRAVENOUS

## 2017-01-30 MED ORDER — SODIUM CHLORIDE 0.9 % IV SOLN: Freq: Once | INTRAVENOUS | Status: DC

## 2017-01-30 MED ORDER — SODIUM CHLORIDE 0.9% FLUSH
10.0000 mL | INTRAVENOUS | Status: DC | PRN
  Administered 2017-01-30: 10 mL
  Filled 2017-01-30: qty 10

## 2017-01-30 MED ORDER — MORPHINE SULFATE 30 MG PO TABS: 60.0000 mg | ORAL_TABLET | ORAL | 0 refills | Status: DC | PRN

## 2017-01-30 MED ORDER — SODIUM CHLORIDE 0.9 % IV SOLN
Freq: Once | INTRAVENOUS | Status: AC
  Administered 2017-01-30: 14:00:00 via INTRAVENOUS

## 2017-01-30 MED ORDER — FAMOTIDINE IN NACL 20-0.9 MG/50ML-% IV SOLN
20.0000 mg | Freq: Once | INTRAVENOUS | Status: AC
  Administered 2017-01-30: 20 mg via INTRAVENOUS

## 2017-01-30 MED ORDER — FAMOTIDINE IN NACL 20-0.9 MG/50ML-% IV SOLN
INTRAVENOUS | Status: AC
  Filled 2017-01-30: qty 50

## 2017-01-30 MED ORDER — PALONOSETRON HCL INJECTION 0.25 MG/5ML
0.2500 mg | Freq: Once | INTRAVENOUS | Status: AC
  Administered 2017-01-30: 0.25 mg via INTRAVENOUS

## 2017-01-30 MED ORDER — HEPARIN SOD (PORK) LOCK FLUSH 100 UNIT/ML IV SOLN
500.0000 [IU] | Freq: Once | INTRAVENOUS | Status: AC
  Administered 2017-01-30: 500 [IU] via INTRAVENOUS
  Filled 2017-01-30: qty 5

## 2017-01-30 MED FILL — MORPHINE SULFATE IR 30 MG T: 30 | 8 days supply | Qty: 90 | Fill #0

## 2017-01-30 NOTE — Telephone Encounter (Signed)
Oncology Nurse Navigator Documentation  Per Dr. Alvy Bimler, called [facility] to arrange appointment.  Spoke with Janett Billow, indicated Mr. Prajapati needs appt with Dr. [name] regarding his trach.  She voiced understanding, indicated she would call him to arrange.  Gayleen Orem, RN, BSN, [place] [facility] Nurse [place] [facility] [place] [phone]

## 2017-01-30 NOTE — Patient Instructions (Signed)
[facility] Discharge Instructions for Patients Receiving Chemotherapy  Today you received the following chemotherapy agents Paclitaxel.   To help prevent nausea and vomiting after your treatment, we encourage you to take your nausea medication as directed.    If you develop nausea and vomiting that is not controlled by your nausea medication, call the clinic.   BELOW ARE SYMPTOMS THAT SHOULD BE REPORTED IMMEDIATELY:  *FEVER GREATER THAN 100.5 F  *CHILLS WITH OR WITHOUT FEVER  NAUSEA AND VOMITING THAT IS NOT CONTROLLED WITH YOUR NAUSEA MEDICATION  *UNUSUAL SHORTNESS OF BREATH  *UNUSUAL BRUISING OR BLEEDING  TENDERNESS IN MOUTH AND THROAT WITH OR WITHOUT PRESENCE OF ULCERS  *URINARY PROBLEMS  *BOWEL PROBLEMS  UNUSUAL RASH Items with * indicate a potential emergency and should be followed up as soon as possible.  Feel free to call the clinic you have any questions or concerns. The clinic phone number is [phone].  Please show the CHEMO ALERT CARD at check-in to the Emergency Department and triage nurse.   

## 2017-01-31 ENCOUNTER — Encounter: Payer: Self-pay | Admitting: Hematology and Oncology

## 2017-01-31 ENCOUNTER — Ambulatory Visit (HOSPITAL_BASED_OUTPATIENT_CLINIC_OR_DEPARTMENT_OTHER): Payer: Medicaid Other | Admitting: Nurse Practitioner

## 2017-01-31 VITALS — BP 121/64 | HR 91 | Temp 98.4°F | Resp 18 | Ht 70.0 in | Wt 166.9 lb

## 2017-01-31 DIAGNOSIS — R112 Nausea with vomiting, unspecified: Secondary | ICD-10-CM | POA: Diagnosis present

## 2017-01-31 DIAGNOSIS — C321 Malignant neoplasm of supraglottis: Secondary | ICD-10-CM

## 2017-01-31 MED ORDER — HYDROMORPHONE HCL 4 MG/ML IJ SOLN
INTRAMUSCULAR | Status: AC
  Filled 2017-01-31: qty 1

## 2017-01-31 MED ORDER — SODIUM CHLORIDE 0.9 % IV SOLN
Freq: Once | INTRAVENOUS | Status: AC
  Administered 2017-01-31: 12:00:00 via INTRAVENOUS

## 2017-01-31 MED ORDER — HYDROMORPHONE HCL 4 MG/ML IJ SOLN
2.0000 mg | INTRAMUSCULAR | Status: DC | PRN
  Administered 2017-01-31: 2 mg via INTRAVENOUS

## 2017-01-31 MED ORDER — HEPARIN SOD (PORK) LOCK FLUSH 100 UNIT/ML IV SOLN
500.0000 [IU] | Freq: Once | INTRAVENOUS | Status: AC | PRN
  Administered 2017-01-31: 500 [IU]
  Filled 2017-01-31: qty 5

## 2017-01-31 MED ORDER — SODIUM CHLORIDE 0.9 % IJ SOLN
10.0000 mL | INTRAMUSCULAR | Status: DC | PRN
  Administered 2017-01-31: 10 mL
  Filled 2017-01-31: qty 10

## 2017-01-31 NOTE — Assessment & Plan Note (Signed)
He has severe, uncontrolled nausea and vomiting after each dose of chemotherapy, out of proportion. He also complained of stomach pain. I will provide IV fluid support. We have changed his anti-emetics several times. As discussed, I plan to order a CT imaging of the abdomen to exclude abdominal metastasis as a cause of his nausea and vomiting

## 2017-01-31 NOTE — Assessment & Plan Note (Signed)
He complained of intermittent bleeding from the tracheostomy site. It could be due to persistent disease. I will get him to go back to see his ENT doctor for tracheostomy site care

## 2017-01-31 NOTE — Progress Notes (Signed)
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[place])   03/15/2015 - 03/17/2015 Hospital Admission    He was admitted to the hospital for evaluation of dysphagia, SOB, hemoptosis, hoarseness, 30-40 pound weight loss and worsening bilateral neck masses for 5 months      03/15/2015 Imaging    Ct showed extensive circumferential malignancy in the hypopharyngeal/supraglottic region with regional LN metastases      03/16/2015 Procedure    He underwent ULTRASOUND-GUIDED BIOPSY OF LEFT CERVICAL LYMPH NODES      03/16/2015 Pathology Results    Accession: [id number] LN biopsy showed invasive squamous cell cancer      03/16/2015 Pathology Results    Accession: [id number] showed atypical squamous cells      03/25/2015 - 04/07/2015 Hospital Admission    He was admitted to the hospital and underwent tracheostomy placement, feeeding tube placement but subsequently left [facility]      03/26/2015 Surgery    He had multiple extraction of tooth numbers 1, 2, 5, 6, 7, 8, 9, 10, 11, 12, 13, 18, 19, 21, 22, 23, 24, 25, 26, 27, 28, and 29. and 4 Quadrants of alveoloplasty      03/26/2015 Surgery    He underwent tracheostomy      03/31/2015 Surgery    He had open gastrostomy tube placement by Dr. Donne Hazel      04/16/2015 - 05/18/2015 Radiation Therapy    Laryngopharynx and bilateral neck / 50 Gy in 20 fractions to gross disease, 45 Gy in 20 fractions to high risk nodal echelons  Beams/energy: Helical IMRT / 6 MV photons      04/16/2015 Procedure    Fluoroscopic reposition of the 18 French gastrostomy confirmed back in the stomach,      07/03/2015 Procedure    IR performed  replacement of gastrostomy tube with a new 70 French balloon retention tube      10/01/2015 Imaging    PEt scan showed persistent hypermetabolism within the primary supraglottic laryngeal tumor and within right retropharyngeal, bilateral level II and left level IV cervical nodal metastases      10/28/2015 Procedure    He had placement of PICC line. The IR was not able to place PORT due to suspected upper respiratory infection      11/13/2015 - 03/21/2016 Chemotherapy    He received 5FU, carboplatin chemo with weekly Erbitux      11/19/2015 Surgery    Gastrostomy tube replaced.      11/30/2015 Procedure    Placement of right jugular port-a-cath.      11/30/2015 Procedure    PICC removed.      12/30/2015 Imaging    PET CT showed positive response to Rx      02/26/2016 Procedure    He underwent direct laryngoscopy with biopsy. Esophageal dilatation.       02/26/2016 Pathology Results    Repeat biopsy of supraglottis showed persistent disease      02/29/2016 Procedure    Gastrostomy tube exchanged.      03/28/2016 PET scan    Hypermetabolic tissue in the posterior RIGHT hypopharynx is similar in pattern to PET-CT of 12/30/2015 but increased in metabolic activity.2. Hypermetabolic tissue /  lymph nodes in the LEFT supraclavicular nodal station are in a similar pattern       04/04/2016 - 11/29/2016 Chemotherapy    He started on palliative chemotherapy with pembrolizumab      05/03/2016 Imaging    MRI head is negative      06/02/2016 Imaging    Mild improvement of diffuse pharyngeal and supraglottic edema.Increased asymmetry of right-sided hypopharyngeal/supraglottic softtissue compared to the prior neck CT with FDG uptake in this region on prior PET-CT. Residual/recurrent tumor is possible      08/09/2016 Procedure    IR placed new 20 French percutaneous gastrostomy tube.      08/26/2016 Imaging    Ct neck showed unchanged diffuse pharyngeal and supraglottic edema as well  as asymmetric soft tissue thickening on the right. Slightly decreased size of some left level II lymph nodes. Unchanged lymphadenopathy elsewhere in the neck. Decreased size of thyroid mass. No evidence of metastatic cancer to the chest      12/13/2016 Imaging    Ct chest showed no evidence of thoracic metastatic disease or primary thoracic malignancy.      12/13/2016 Imaging    Ct neck showed progression of of RIGHT supraglottic mass compared with priors, approximate size 24 x 27 x 27 mm. Extension caudally along the RIGHT area epiglottic fold with increasing mass effect on the airway. Tracheostomy satisfactory position. Stable to slightly improved malignant adenopathy.      12/26/2016 -  Chemotherapy    He received chemotherapy with weekly Taxol        INTERVAL HISTORY: Please see below for problem oriented charting. He is seen for further evaluation prior to chemotherapy. He continues to struggle with dehydration, profound nausea and vomiting after each dose of treatment. He has lost significant amount of weight. He also felt the pain is worse especially in his throat. He has noted occasional bleeding. He also noted some severe epigastric pain especially when he tries to eat. He denies constipation.  REVIEW OF SYSTEMS:   Constitutional: Denies fevers, chills  Eyes: Denies blurriness of vision Ears, nose, mouth, throat, and face: Denies mucositis or sore throat Respiratory: Denies cough, dyspnea or wheezes Cardiovascular: Denies palpitation, chest discomfort or lower extremity swelling Skin: Denies abnormal skin rashes Lymphatics: Denies new lymphadenopathy or easy bruising Neurological:Denies numbness, tingling or new weaknesses Behavioral/Psych: Mood is stable, no new changes  All other systems were reviewed with the patient and are negative.  I have reviewed the past medical history, past surgical history, social history and family history with the patient and they are  unchanged from previous note.  ALLERGIES:  is allergic to aspirin.  MEDICATIONS:  Current Outpatient Prescriptions  Medication Sig Dispense Refill  . fluticasone (FLONASE) 50 MCG/ACT nasal spray Place 2 sprays into both nostrils daily. 16 g 2  . guaiFENesin (ROBITUSSIN) 100 MG/5ML SOLN Take 5 mLs (100 mg total) by mouth every 4 (four) hours as needed for cough or to loosen phlegm. 473 mL 3  . lactulose (CHRONULAC) 10 GM/15ML solution Take 15 mLs (10 g total) by mouth 3 (three) times daily. 473 mL 2  . levothyroxine (SYNTHROID, LEVOTHROID) 50 MCG tablet Take 1 tablet (50 mcg total) by mouth daily before breakfast. 30 tablet 9  . lidocaine (XYLOCAINE) 2 % solution Use as directed 15 mLs in the mouth or throat every 8 (eight) hours as needed for mouth pain. (Patient not taking: Reported on 01/09/2017) 100 mL 0  . LORazepam (ATIVAN) 1 MG tablet Take 1  tablet (1 mg total) by mouth 2 (two) times daily as needed for anxiety (or nausea). 10 tablet 0  . morphine (MS CONTIN) 30 MG 12 hr tablet Take 1 tablet (30 mg total) by mouth every 12 (twelve) hours. 60 tablet 0  . morphine (MSIR) 30 MG tablet Take 2 tablets (60 mg total) by mouth every 4 (four) hours as needed for severe pain. 90 tablet 0  . Nutritional Supplements (FEEDING SUPPLEMENT, JEVITY 1.5 CAL/FIBER,) LIQD Give 1 can Osmolite 1.2 + 1 can of Jevity 1.5 QID via PEG with 60 cc free water before and after bolus. Flush with 240 cc free water BID between feedings. 948 mL   . omeprazole (PRILOSEC) 20 MG capsule Take 1 capsule (20 mg total) by mouth 2 (two) times daily. 60 capsule 0  . ondansetron (ZOFRAN) 8 MG tablet Take 1 tablet (8 mg total) by mouth every 8 (eight) hours as needed for nausea. 60 tablet 3  . polyethylene glycol (MIRALAX) packet Take 17 g by mouth daily. (Patient not taking: Reported on 01/09/2017) 14 each 0  . SODIUM CHLORIDE, EXTERNAL, 0.9 % SOLN Use to clean around Trach and perform Trach care once daily and PRN 1000 mL 11  .  triamcinolone (NASACORT AQ) 55 MCG/ACT AERO nasal inhaler Place 2 sprays into the nose daily. (Patient not taking: Reported on 01/09/2017) 1 Inhaler 12   Current Facility-Administered Medications  Medication Dose Route Frequency Provider Last Rate Last Dose  . guaiFENesin-dextromethorphan (ROBITUSSIN DM) 100-10 MG/5ML syrup 10 mL  10 mL Oral Q4H PRN [name], MD   10 mL at 01/09/17 1427  . sodium chloride flush (NS) 0.9 % injection 10 mL  10 mL Intravenous PRN [name], MD   10 mL at 01/30/17 1326   Facility-Administered Medications Ordered in Other Visits  Medication Dose Route Frequency Provider Last Rate Last Dose  . heparin lock flush 100 unit/mL  500 Units Intracatheter Once PRN [name], MD      . HYDROmorphone (DILAUDID) injection 2 mg  2 mg Intravenous Q2H PRN [name], MD   2 mg at 12/26/16 1440  . HYDROmorphone (DILAUDID) injection 2 mg  2 mg Intravenous Q2H PRN [name], MD   2 mg at 01/31/17 1231  . sodium chloride 0.9 % injection 10 mL  10 mL Intracatheter PRN [name], MD   10 mL at 01/02/17 1107  . sodium chloride 0.9 % injection 10 mL  10 mL Intracatheter PRN [name], MD      . sodium chloride flush (NS) 0.9 % injection 10 mL  10 mL Intracatheter PRN [name], MD   10 mL at 12/26/16 1708    PHYSICAL EXAMINATION: ECOG PERFORMANCE STATUS: 2 - Symptomatic, <50% confined to bed  Vitals:   01/30/17 1326  BP: 115/61  Pulse: (!) 104  Resp: 18  Temp: 99.4 F (37.4 C)   Filed Weights   01/30/17 1326  Weight: 164 lb 1.6 oz (74.4 kg)    GENERAL:alert, no distress and comfortable SKIN: skin color, texture, turgor are normal, no rashes or significant lesions EYES: normal, Conjunctiva are pink and non-injected, sclera clear OROPHARYNX:no exudate, no erythema and lips, buccal mucosa, and tongue normal  NECK: Tracheostomy in situ.  No obvious bleeding LYMPH:  no palpable lymphadenopathy in the cervical, axillary or inguinal LUNGS: clear to auscultation and  percussion with normal breathing effort HEART: regular rate & rhythm and no murmurs and no lower extremity edema ABDOMEN:abdomen soft, non-tender and normal  bowel sounds Musculoskeletal:no cyanosis of digits and no clubbing  NEURO: alert & oriented x 3 with fluent speech, no focal motor/sensory deficits  LABORATORY DATA:  I have reviewed the data as listed    Component Value Date/Time   NA 138 01/30/2017 1307   K 3.9 01/30/2017 1307   CL 99 (L) 12/27/2016 1748   CO2 26 01/30/2017 1307   GLUCOSE 104 01/30/2017 1307   BUN 16.7 01/30/2017 1307   CREATININE 0.8 01/30/2017 1307   CALCIUM 10.3 01/30/2017 1307   PROT 7.3 01/30/2017 1307   ALBUMIN 3.9 01/30/2017 1307   AST 17 01/30/2017 1307   ALT 41 01/30/2017 1307   ALKPHOS 118 01/30/2017 1307   BILITOT 0.36 01/30/2017 1307   GFRNONAA >60 12/27/2016 1748   GFRAA >60 12/27/2016 1748    No results found for: SPEP, UPEP  Lab Results  Component Value Date   WBC 10.4 (H) 01/30/2017   NEUTROABS 8.8 (H) 01/30/2017   HGB 12.2 (L) 01/30/2017   HCT 35.6 (L) 01/30/2017   MCV 98.1 (H) 01/30/2017   PLT 208 01/30/2017      Chemistry      Component Value Date/Time   NA 138 01/30/2017 1307   K 3.9 01/30/2017 1307   CL 99 (L) 12/27/2016 1748   CO2 26 01/30/2017 1307   BUN 16.7 01/30/2017 1307   CREATININE 0.8 01/30/2017 1307      Component Value Date/Time   CALCIUM 10.3 01/30/2017 1307   ALKPHOS 118 01/30/2017 1307   AST 17 01/30/2017 1307   ALT 41 01/30/2017 1307   BILITOT 0.36 01/30/2017 1307      ASSESSMENT & PLAN:  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 I will add 2 days of IV fluids and anti-emetics support therapy for him. His side effects is disproportionate. I am wondering whether he could have disease progression especially symptoms of abdominal discomfort. He has progressive weight loss. I plan to repeat imaging studies before I see  him back to assess response to treatment.  Tracheostomy care [facility]) He complained of intermittent bleeding from the tracheostomy site. It could be due to persistent disease. I will get him to go back to see his ENT doctor for tracheostomy site care  Anemia due to antineoplastic chemotherapy This is likely due to recent treatment. The patient denies recent history of bleeding such as epistaxis, hematuria or hematochezia. He is asymptomatic from the anemia. I will observe for now.  He does not require transfusion now. I will continue the chemotherapy at current dose without dosage adjustment.  If the anemia gets progressive worse in the future, I might have to delay his treatment or adjust the chemotherapy dose.   Chemotherapy-induced nausea He has severe, uncontrolled nausea and vomiting after each dose of chemotherapy, out of proportion. He also complained of stomach pain. I will provide IV fluid support. We have changed his anti-emetics several times. As discussed, I plan to order a CT imaging of the abdomen to exclude abdominal metastasis as a cause of his nausea and vomiting  Acquired hypothyroidism He has acquired hypothyroidism after prior radiation treatment. His recent TSH is mildly elevated. I recommend close observation for now.  Cancer associated pain This is overall stable He complained MS Contin caued too much sedation He can continue to take MS Contin at night and IR morphine as needed for pain during day time I refilled his prescription pain medicine today. I will order  imaging study to evaluate for disease progression due to uncontrolled pain. I also gave him permission to increase the IR morphine to 45 mg or 60 mg as needed for severe pain   Orders Placed This Encounter  Procedures  . CT ABDOMEN PELVIS W CONTRAST    Standing Status:   Future    Standing Expiration Date:   03/06/2018    Order Specific Question:   Reason for exam:    Answer:   severe abdominal pain,  weight loss, exclude metastasis    Order Specific Question:   Preferred imaging location?    Answer:   [facility]  . CT Soft Tissue Neck W Contrast    Standing Status:   Future    Standing Expiration Date:   01/30/2018    Order Specific Question:   If indicated for the ordered procedure, I authorize the administration of contrast media per Radiology protocol    Answer:   Yes    Order Specific Question:   Reason for Exam (SYMPTOM  OR DIAGNOSIS REQUIRED)    Answer:   neck pain, supraglottic cancer, assess response to Rx    Order Specific Question:   Preferred imaging location?    Answer:   [facility]   All questions were answered. The patient knows to call the clinic with any problems, questions or concerns. No barriers to learning was detected. I spent 30 minutes counseling the patient face to face. The total time spent in the appointment was 40 minutes and more than 50% was on counseling and review of test results     [name], MD 01/31/2017 1:34 PM

## 2017-01-31 NOTE — Progress Notes (Signed)
RN visit only for IV fluids. 

## 2017-01-31 NOTE — Assessment & Plan Note (Signed)
This is overall stable He complained MS Contin caued too much sedation He can continue to take MS Contin at night and IR morphine as needed for pain during day time I refilled his prescription pain medicine today. I will order imaging study to evaluate for disease progression due to uncontrolled pain. I also gave him permission to increase the IR morphine to 45 mg or 60 mg as needed for severe pain

## 2017-01-31 NOTE — Assessment & Plan Note (Signed)
He tolerated chemotherapy very poorly with significant nausea and vomiting. I will add 2 days of IV fluids and anti-emetics support therapy for him. His side effects is disproportionate. I am wondering whether he could have disease progression especially symptoms of abdominal discomfort. He has progressive weight loss. I plan to repeat imaging studies before I see him back to assess response to treatment.

## 2017-01-31 NOTE — Patient Instructions (Signed)
Dehydration, Adult Dehydration is a condition in which there is not enough fluid or water in the body. This happens when you lose more fluids than you take in. Important organs, such as the kidneys, brain, and heart, cannot function without a proper amount of fluids. Any loss of fluids from the body can lead to dehydration. Dehydration can range from mild to severe. This condition should be treated right away to prevent it from becoming severe. What are the causes? This condition may be caused by:  Vomiting.  Diarrhea.  Excessive sweating, such as from heat exposure or exercise.  Not drinking enough fluid, especially:  When ill.  While doing activity that requires a lot of energy.  Excessive urination.  Fever.  Infection.  Certain medicines, such as medicines that cause the body to lose excess fluid (diuretics).  Inability to access safe drinking water.  Reduced physical ability to get adequate water and food. What increases the risk? This condition is more likely to develop in people:  Who have a poorly controlled long-term (chronic) illness, such as diabetes, heart disease, or kidney disease.  Who are age 65 or older.  Who are disabled.  Who live in a place with high altitude.  Who play endurance sports. What are the signs or symptoms? Symptoms of mild dehydration may include:   Thirst.  Dry lips.  Slightly dry mouth.  Dry, warm skin.  Dizziness. Symptoms of moderate dehydration may include:   Very dry mouth.  Muscle cramps.  Dark urine. Urine may be the color of tea.  Decreased urine production.  Decreased tear production.  Heartbeat that is irregular or faster than normal (palpitations).  Headache.  Light-headedness, especially when you stand up from a sitting position.  Fainting (syncope). Symptoms of severe dehydration may include:   Changes in skin, such as:  Cold and clammy skin.  Blotchy (mottled) or pale skin.  Skin that does  not quickly return to normal after being lightly pinched and released (poor skin turgor).  Changes in body fluids, such as:  Extreme thirst.  No tear production.  Inability to sweat when body temperature is high, such as in hot weather.  Very little urine production.  Changes in vital signs, such as:  Weak pulse.  Pulse that is more than 100 beats a minute when sitting still.  Rapid breathing.  Low blood pressure.  Other changes, such as:  Sunken eyes.  Cold hands and feet.  Confusion.  Lack of energy (lethargy).  Difficulty waking up from sleep.  Short-term weight loss.  Unconsciousness. How is this diagnosed? This condition is diagnosed based on your symptoms and a physical exam. Blood and urine tests may be done to help confirm the diagnosis. How is this treated? Treatment for this condition depends on the severity. Mild or moderate dehydration can often be treated at home. Treatment should be started right away. Do not wait until dehydration becomes severe. Severe dehydration is an emergency and it needs to be treated in a hospital. Treatment for mild dehydration may include:   Drinking more fluids.  Replacing salts and minerals in your blood (electrolytes) that you may have lost. Treatment for moderate dehydration may include:   Drinking an oral rehydration solution (ORS). This is a drink that helps you replace fluids and electrolytes (rehydrate). It can be found at pharmacies and retail stores. Treatment for severe dehydration may include:   Receiving fluids through an IV tube.  Receiving an electrolyte solution through a feeding tube that is   passed through your nose and into your stomach (nasogastric tube, or NG tube).  Correcting any abnormalities in electrolytes.  Treating the underlying cause of dehydration. Follow these instructions at home:  If directed by your health care provider, drink an ORS:  Make an ORS by following instructions on the  package.  Start by drinking small amounts, about  cup (120 mL) every 5-10 minutes.  Slowly increase how much you drink until you have taken the amount recommended by your health care provider.  Drink enough clear fluid to keep your urine clear or pale yellow. If you were told to drink an ORS, finish the ORS first, then start slowly drinking other clear fluids. Drink fluids such as:  Water. Do not drink only water. Doing that can lead to having too little salt (sodium) in the body (hyponatremia).  Ice chips.  Fruit juice that you have added water to (diluted fruit juice).  Low-calorie sports drinks.  Avoid:  Alcohol.  Drinks that contain a lot of sugar. These include high-calorie sports drinks, fruit juice that is not diluted, and soda.  Caffeine.  Foods that are greasy or contain a lot of fat or sugar.  Take over-the-counter and prescription medicines only as told by your health care provider.  Do not take sodium tablets. This can lead to having too much sodium in the body (hypernatremia).  Eat foods that contain a healthy balance of electrolytes, such as bananas, oranges, potatoes, tomatoes, and spinach.  Keep all follow-up visits as told by your health care provider. This is important. Contact a health care provider if:  You have abdominal pain that:  Gets worse.  Stays in one area (localizes).  You have a rash.  You have a stiff neck.  You are more irritable than usual.  You are sleepier or more difficult to wake up than usual.  You feel weak or dizzy.  You feel very thirsty.  You have urinated only a small amount of very dark urine over 6-8 hours. Get help right away if:  You have symptoms of severe dehydration.  You cannot drink fluids without vomiting.  Your symptoms get worse with treatment.  You have a fever.  You have a severe headache.  You have vomiting or diarrhea that:  Gets worse.  Does not go away.  You have blood or green matter  (bile) in your vomit.  You have blood in your stool. This may cause stool to look black and tarry.  You have not urinated in 6-8 hours.  You faint.  Your heart rate while sitting still is over 100 beats a minute.  You have trouble breathing. This information is not intended to replace advice given to you by your health care provider. Make sure you discuss any questions you have with your health care provider. Document Released: 11/28/2005 Document Revised: 06/24/2016 Document Reviewed: 01/22/2016 Elsevier Interactive Patient Education  2017 [employer]  

## 2017-01-31 NOTE — Assessment & Plan Note (Signed)
This is likely due to recent treatment. The patient denies recent history of bleeding such as epistaxis, hematuria or hematochezia. He is asymptomatic from the anemia. I will observe for now.  He does not require transfusion now. I will continue the chemotherapy at current dose without dosage adjustment.  If the anemia gets progressive worse in the future, I might have to delay his treatment or adjust the chemotherapy dose.  

## 2017-01-31 NOTE — Assessment & Plan Note (Signed)
He has acquired hypothyroidism after prior radiation treatment. His recent TSH is mildly elevated. I recommend close observation for now.

## 2017-02-01 ENCOUNTER — Telehealth: Payer: Self-pay | Admitting: *Deleted

## 2017-02-01 ENCOUNTER — Ambulatory Visit: Payer: Self-pay | Admitting: Nurse Practitioner

## 2017-02-01 NOTE — Telephone Encounter (Signed)
"  I was to come in today for IVF.  I can't come today.  Can I come in tomorrow?"

## 2017-02-01 NOTE — Telephone Encounter (Signed)
Per patient request I have moved appt from today to tomorrow. Patient aware

## 2017-02-02 ENCOUNTER — Ambulatory Visit (HOSPITAL_BASED_OUTPATIENT_CLINIC_OR_DEPARTMENT_OTHER): Payer: Medicaid Other | Admitting: Nurse Practitioner

## 2017-02-02 VITALS — BP 126/71 | HR 96 | Temp 98.9°F | Resp 18 | Ht 70.0 in | Wt 170.6 lb

## 2017-02-02 DIAGNOSIS — R112 Nausea with vomiting, unspecified: Secondary | ICD-10-CM | POA: Diagnosis not present

## 2017-02-02 DIAGNOSIS — C321 Malignant neoplasm of supraglottis: Secondary | ICD-10-CM | POA: Diagnosis not present

## 2017-02-02 MED ORDER — PROCHLORPERAZINE EDISYLATE 5 MG/ML IJ SOLN
10.0000 mg | Freq: Once | INTRAMUSCULAR | Status: AC
  Administered 2017-02-02: 10 mg via INTRAVENOUS

## 2017-02-02 MED ORDER — SODIUM CHLORIDE 0.9 % IV SOLN
1000.0000 mL | Freq: Once | INTRAVENOUS | Status: AC
  Administered 2017-02-02: 1000 mL via INTRAVENOUS

## 2017-02-02 MED ORDER — PROCHLORPERAZINE EDISYLATE 5 MG/ML IJ SOLN
INTRAMUSCULAR | Status: AC
  Filled 2017-02-02: qty 2

## 2017-02-02 MED ORDER — HYDROMORPHONE HCL 4 MG/ML IJ SOLN
2.0000 mg | Freq: Once | INTRAMUSCULAR | Status: AC
Start: 2017-02-02 — End: 2017-02-02
  Administered 2017-02-02: 2 mg via INTRAVENOUS

## 2017-02-02 MED ORDER — HEPARIN SOD (PORK) LOCK FLUSH 100 UNIT/ML IV SOLN
500.0000 [IU] | Freq: Once | INTRAVENOUS | Status: AC | PRN
Start: 2017-02-02 — End: 2017-02-02
  Administered 2017-02-02: 500 [IU]
  Filled 2017-02-02: qty 5

## 2017-02-02 MED ORDER — SODIUM CHLORIDE 0.9 % IJ SOLN
10.0000 mL | INTRAMUSCULAR | Status: DC | PRN
  Administered 2017-02-02: 10 mL
  Filled 2017-02-02: qty 10

## 2017-02-02 MED ORDER — HYDROMORPHONE HCL 4 MG/ML IJ SOLN
INTRAMUSCULAR | Status: AC
  Filled 2017-02-02: qty 1

## 2017-02-02 NOTE — Progress Notes (Signed)
RN visit for IVF

## 2017-02-02 NOTE — Patient Instructions (Signed)
Dehydration, Adult Dehydration is a condition in which there is not enough fluid or water in the body. This happens when you lose more fluids than you take in. Important organs, such as the kidneys, brain, and heart, cannot function without a proper amount of fluids. Any loss of fluids from the body can lead to dehydration. Dehydration can range from mild to severe. This condition should be treated right away to prevent it from becoming severe. What are the causes? This condition may be caused by:  Vomiting.  Diarrhea.  Excessive sweating, such as from heat exposure or exercise.  Not drinking enough fluid, especially:  When ill.  While doing activity that requires a lot of energy.  Excessive urination.  Fever.  Infection.  Certain medicines, such as medicines that cause the body to lose excess fluid (diuretics).  Inability to access safe drinking water.  Reduced physical ability to get adequate water and food. What increases the risk? This condition is more likely to develop in people:  Who have a poorly controlled long-term (chronic) illness, such as diabetes, heart disease, or kidney disease.  Who are age 65 or older.  Who are disabled.  Who live in a place with high altitude.  Who play endurance sports. What are the signs or symptoms? Symptoms of mild dehydration may include:   Thirst.  Dry lips.  Slightly dry mouth.  Dry, warm skin.  Dizziness. Symptoms of moderate dehydration may include:   Very dry mouth.  Muscle cramps.  Dark urine. Urine may be the color of tea.  Decreased urine production.  Decreased tear production.  Heartbeat that is irregular or faster than normal (palpitations).  Headache.  Light-headedness, especially when you stand up from a sitting position.  Fainting (syncope). Symptoms of severe dehydration may include:   Changes in skin, such as:  Cold and clammy skin.  Blotchy (mottled) or pale skin.  Skin that does  not quickly return to normal after being lightly pinched and released (poor skin turgor).  Changes in body fluids, such as:  Extreme thirst.  No tear production.  Inability to sweat when body temperature is high, such as in hot weather.  Very little urine production.  Changes in vital signs, such as:  Weak pulse.  Pulse that is more than 100 beats a minute when sitting still.  Rapid breathing.  Low blood pressure.  Other changes, such as:  Sunken eyes.  Cold hands and feet.  Confusion.  Lack of energy (lethargy).  Difficulty waking up from sleep.  Short-term weight loss.  Unconsciousness. How is this diagnosed? This condition is diagnosed based on your symptoms and a physical exam. Blood and urine tests may be done to help confirm the diagnosis. How is this treated? Treatment for this condition depends on the severity. Mild or moderate dehydration can often be treated at home. Treatment should be started right away. Do not wait until dehydration becomes severe. Severe dehydration is an emergency and it needs to be treated in a hospital. Treatment for mild dehydration may include:   Drinking more fluids.  Replacing salts and minerals in your blood (electrolytes) that you may have lost. Treatment for moderate dehydration may include:   Drinking an oral rehydration solution (ORS). This is a drink that helps you replace fluids and electrolytes (rehydrate). It can be found at pharmacies and retail stores. Treatment for severe dehydration may include:   Receiving fluids through an IV tube.  Receiving an electrolyte solution through a feeding tube that is   passed through your nose and into your stomach (nasogastric tube, or NG tube).  Correcting any abnormalities in electrolytes.  Treating the underlying cause of dehydration. Follow these instructions at home:  If directed by your health care provider, drink an ORS:  Make an ORS by following instructions on the  package.  Start by drinking small amounts, about  cup (120 mL) every 5-10 minutes.  Slowly increase how much you drink until you have taken the amount recommended by your health care provider.  Drink enough clear fluid to keep your urine clear or pale yellow. If you were told to drink an ORS, finish the ORS first, then start slowly drinking other clear fluids. Drink fluids such as:  Water. Do not drink only water. Doing that can lead to having too little salt (sodium) in the body (hyponatremia).  Ice chips.  Fruit juice that you have added water to (diluted fruit juice).  Low-calorie sports drinks.  Avoid:  Alcohol.  Drinks that contain a lot of sugar. These include high-calorie sports drinks, fruit juice that is not diluted, and soda.  Caffeine.  Foods that are greasy or contain a lot of fat or sugar.  Take over-the-counter and prescription medicines only as told by your health care provider.  Do not take sodium tablets. This can lead to having too much sodium in the body (hypernatremia).  Eat foods that contain a healthy balance of electrolytes, such as bananas, oranges, potatoes, tomatoes, and spinach.  Keep all follow-up visits as told by your health care provider. This is important. Contact a health care provider if:  You have abdominal pain that:  Gets worse.  Stays in one area (localizes).  You have a rash.  You have a stiff neck.  You are more irritable than usual.  You are sleepier or more difficult to wake up than usual.  You feel weak or dizzy.  You feel very thirsty.  You have urinated only a small amount of very dark urine over 6-8 hours. Get help right away if:  You have symptoms of severe dehydration.  You cannot drink fluids without vomiting.  Your symptoms get worse with treatment.  You have a fever.  You have a severe headache.  You have vomiting or diarrhea that:  Gets worse.  Does not go away.  You have blood or green matter  (bile) in your vomit.  You have blood in your stool. This may cause stool to look black and tarry.  You have not urinated in 6-8 hours.  You faint.  Your heart rate while sitting still is over 100 beats a minute.  You have trouble breathing. This information is not intended to replace advice given to you by your health care provider. Make sure you discuss any questions you have with your health care provider. Document Released: 11/28/2005 Document Revised: 06/24/2016 Document Reviewed: 01/22/2016 Elsevier Interactive Patient Education  2017 [employer]  

## 2017-02-06 ENCOUNTER — Telehealth: Payer: Self-pay | Admitting: Hematology and Oncology

## 2017-02-06 NOTE — Telephone Encounter (Signed)
Confirmed 3/5 appt at 1230 per LOS

## 2017-02-07 ENCOUNTER — Telehealth: Payer: Self-pay

## 2017-02-07 NOTE — Telephone Encounter (Signed)
Called patient back, he left message that he needed to talk with someone.  Called patient back and he wanted to know if he could drink and put contrast through tube feeding. Instructed patient that it would be okay either way.

## 2017-02-10 ENCOUNTER — Ambulatory Visit (HOSPITAL_COMMUNITY)
Admission: RE | Admit: 2017-02-10 | Discharge: 2017-02-10 | Disposition: A | Discharge status: Home / Self Care | Payer: Medicaid Other | Source: Ambulatory Visit | Attending: Interventional Radiology | Admitting: Interventional Radiology

## 2017-02-10 ENCOUNTER — Other Ambulatory Visit (HOSPITAL_COMMUNITY): Payer: Self-pay | Admitting: Interventional Radiology

## 2017-02-10 ENCOUNTER — Encounter (HOSPITAL_COMMUNITY): Payer: Self-pay

## 2017-02-10 ENCOUNTER — Ambulatory Visit (HOSPITAL_COMMUNITY)
Admission: RE | Admit: 2017-02-10 | Discharge: 2017-02-10 | Disposition: A | Discharge status: Home / Self Care | Payer: Medicaid Other | Source: Ambulatory Visit | Attending: Hematology and Oncology | Admitting: Hematology and Oncology

## 2017-02-10 DIAGNOSIS — C321 Malignant neoplasm of supraglottis: Secondary | ICD-10-CM | POA: Diagnosis not present

## 2017-02-10 DIAGNOSIS — R634 Abnormal weight loss: Secondary | ICD-10-CM

## 2017-02-10 DIAGNOSIS — R1033 Periumbilical pain: Secondary | ICD-10-CM | POA: Diagnosis not present

## 2017-02-10 DIAGNOSIS — K8020 Calculus of gallbladder without cholecystitis without obstruction: Secondary | ICD-10-CM | POA: Insufficient documentation

## 2017-02-10 DIAGNOSIS — Z9889 Other specified postprocedural states: Secondary | ICD-10-CM | POA: Insufficient documentation

## 2017-02-10 DIAGNOSIS — R633 Feeding difficulties, unspecified: Secondary | ICD-10-CM

## 2017-02-10 HISTORY — PX: IR GENERIC HISTORICAL: IMG1180011

## 2017-02-10 MED ORDER — IOPAMIDOL (ISOVUE-300) INJECTION 61%
7.0000 mL | Freq: Once | INTRAVENOUS | Status: AC | PRN
  Administered 2017-02-10: 7 mL via INTRAVENOUS

## 2017-02-10 MED ORDER — IOPAMIDOL (ISOVUE-300) INJECTION 61%
INTRAVENOUS | Status: AC
  Filled 2017-02-10: qty 50

## 2017-02-10 MED ORDER — IOPAMIDOL (ISOVUE-300) INJECTION 61%
INTRAVENOUS | Status: AC
  Filled 2017-02-10: qty 100

## 2017-02-10 MED ORDER — IOPAMIDOL (ISOVUE-300) INJECTION 61%
100.0000 mL | Freq: Once | INTRAVENOUS | Status: AC | PRN
  Administered 2017-02-10: 100 mL via INTRAVENOUS

## 2017-02-10 NOTE — Procedures (Signed)
Successful fluoroscopic guided replacement of 20 Fr gastrostomy tube.  No immediate post procedural complications.  The feeding tube is ready for immediate use.  Ronny Bacon, MD Pager #: [phone]

## 2017-02-13 ENCOUNTER — Encounter: Payer: Self-pay | Admitting: Hematology and Oncology

## 2017-02-13 ENCOUNTER — Ambulatory Visit (HOSPITAL_BASED_OUTPATIENT_CLINIC_OR_DEPARTMENT_OTHER): Payer: Medicaid Other | Admitting: Hematology and Oncology

## 2017-02-13 DIAGNOSIS — G893 Neoplasm related pain (acute) (chronic): Secondary | ICD-10-CM

## 2017-02-13 DIAGNOSIS — R110 Nausea: Secondary | ICD-10-CM | POA: Diagnosis not present

## 2017-02-13 DIAGNOSIS — C321 Malignant neoplasm of supraglottis: Secondary | ICD-10-CM | POA: Diagnosis present

## 2017-02-13 DIAGNOSIS — T451X5A Adverse effect of antineoplastic and immunosuppressive drugs, initial encounter: Secondary | ICD-10-CM

## 2017-02-13 NOTE — Assessment & Plan Note (Signed)
Recent CT scan shows stable disease. He tolerated chemo well except for profound nausea and vomiting, relieved by stronger anti-emetics and 2 days of IV fluids following every treatment. We will resume treatment next week. His treatment cycle is day 1 and day 8, rest day 15 for a cycle of 21 days. I plan to see him back again in 4 weeks

## 2017-02-13 NOTE — Progress Notes (Signed)
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[place])   03/15/2015 - 03/17/2015 Hospital Admission    He was admitted to the hospital for evaluation of dysphagia, SOB, hemoptosis, hoarseness, 30-40 pound weight loss and worsening bilateral neck masses for 5 months      03/15/2015 Imaging    Ct showed extensive circumferential malignancy in the hypopharyngeal/supraglottic region with regional LN metastases      03/16/2015 Procedure    He underwent ULTRASOUND-GUIDED BIOPSY OF LEFT CERVICAL LYMPH NODES      03/16/2015 Pathology Results    Accession: [id number] LN biopsy showed invasive squamous cell cancer      03/16/2015 Pathology Results    Accession: [id number] showed atypical squamous cells      03/25/2015 - 04/07/2015 Hospital Admission    He was admitted to the hospital and underwent tracheostomy placement, feeeding tube placement but subsequently left [facility]      03/26/2015 Surgery    He had multiple extraction of tooth numbers 1, 2, 5, 6, 7, 8, 9, 10, 11, 12, 13, 18, 19, 21, 22, 23, 24, 25, 26, 27, 28, and 29. and 4 Quadrants of alveoloplasty      03/26/2015 Surgery    He underwent tracheostomy      03/31/2015 Surgery    He had open gastrostomy tube placement by Dr. Donne Hazel      04/16/2015 - 05/18/2015 Radiation Therapy    Laryngopharynx and bilateral neck / 50 Gy in 20 fractions to gross disease, 45 Gy in 20 fractions to high risk nodal echelons  Beams/energy: Helical IMRT / 6 MV photons      04/16/2015 Procedure    Fluoroscopic reposition of the 18 French gastrostomy confirmed back in the stomach,      07/03/2015 Procedure    IR performed  replacement of gastrostomy tube with a new 70 French balloon retention tube      10/01/2015 Imaging    PEt scan showed persistent hypermetabolism within the primary supraglottic laryngeal tumor and within right retropharyngeal, bilateral level II and left level IV cervical nodal metastases      10/28/2015 Procedure    He had placement of PICC line. The IR was not able to place PORT due to suspected upper respiratory infection      11/13/2015 - 03/21/2016 Chemotherapy    He received 5FU, carboplatin chemo with weekly Erbitux      11/19/2015 Surgery    Gastrostomy tube replaced.      11/30/2015 Procedure    Placement of right jugular port-a-cath.      11/30/2015 Procedure    PICC removed.      12/30/2015 Imaging    PET CT showed positive response to Rx      02/26/2016 Procedure    He underwent direct laryngoscopy with biopsy. Esophageal dilatation.       02/26/2016 Pathology Results    Repeat biopsy of supraglottis showed persistent disease      02/29/2016 Procedure    Gastrostomy tube exchanged.      03/28/2016 PET scan    Hypermetabolic tissue in the posterior RIGHT hypopharynx is similar in pattern to PET-CT of 12/30/2015 but increased in metabolic activity.2. Hypermetabolic tissue /  lymph nodes in the LEFT supraclavicular nodal station are in a similar pattern       04/04/2016 - 11/29/2016 Chemotherapy    He started on palliative chemotherapy with pembrolizumab      05/03/2016 Imaging    MRI head is negative      06/02/2016 Imaging    Mild improvement of diffuse pharyngeal and supraglottic edema.Increased asymmetry of right-sided hypopharyngeal/supraglottic softtissue compared to the prior neck CT with FDG uptake in this region on prior PET-CT. Residual/recurrent tumor is possible      08/09/2016 Procedure    IR placed new 20 French percutaneous gastrostomy tube.      08/26/2016 Imaging    Ct neck showed unchanged diffuse pharyngeal and supraglottic edema as well  as asymmetric soft tissue thickening on the right. Slightly decreased size of some left level II lymph nodes. Unchanged lymphadenopathy elsewhere in the neck. Decreased size of thyroid mass. No evidence of metastatic cancer to the chest      12/13/2016 Imaging    Ct chest showed no evidence of thoracic metastatic disease or primary thoracic malignancy.      12/13/2016 Imaging    Ct neck showed progression of of RIGHT supraglottic mass compared with priors, approximate size 24 x 27 x 27 mm. Extension caudally along the RIGHT area epiglottic fold with increasing mass effect on the airway. Tracheostomy satisfactory position. Stable to slightly improved malignant adenopathy.      12/26/2016 -  Chemotherapy    He received chemotherapy with weekly Taxol       02/10/2017 Imaging    Very similar appearance to the study of January. Right sided supraglottic to glottic mass is similar, perhaps a few mm smaller. Bilateral enlarged nodes appear the same. No progressive finding      02/10/2017 Imaging    No evidence of metastatic disease in the abdomen/pelvis. Percutaneous gastrostomy in satisfactory position. Cholelithiasis, without associated inflammatory changes.       INTERVAL HISTORY: Please see below for problem oriented charting. He returns today to review test results. He denies recent hemoptysis. His abdominal discomfort had resolved. He has lost a bit of weight but overall he feels well today. Pain is not worse.  REVIEW OF SYSTEMS:   Constitutional: Denies fevers, chills Eyes: Denies blurriness of vision Ears, nose, mouth, throat, and face: Denies mucositis or sore throat Respiratory: Denies cough, dyspnea or wheezes Cardiovascular: Denies palpitation, chest discomfort or lower extremity swelling Gastrointestinal:  Denies nausea, heartburn or change in bowel habits Skin: Denies abnormal skin rashes Lymphatics: Denies new lymphadenopathy or easy bruising Neurological:Denies numbness,  tingling or new weaknesses Behavioral/Psych: Mood is stable, no new changes  All other systems were reviewed with the patient and are negative.  I have reviewed the past medical history, past surgical history, social history and family history with the patient and they are unchanged from previous note.  ALLERGIES:  is allergic to aspirin.  MEDICATIONS:  Current Outpatient Prescriptions  Medication Sig Dispense Refill  . fluticasone (FLONASE) 50 MCG/ACT nasal spray Place 2 sprays into both nostrils daily. 16 g 2  . guaiFENesin (ROBITUSSIN) 100 MG/5ML SOLN Take 5 mLs (100 mg total) by mouth every 4 (four) hours as needed for cough or to loosen phlegm. 473 mL 3  . levothyroxine (SYNTHROID, LEVOTHROID) 50 MCG tablet Take 1 tablet (50 mcg total) by mouth daily before breakfast. 30 tablet 9  . morphine (MS CONTIN) 30 MG 12 hr tablet Take 1 tablet (30 mg total) by mouth every  12 (twelve) hours. 60 tablet 0  . morphine (MSIR) 30 MG tablet Take 2 tablets (60 mg total) by mouth every 4 (four) hours as needed for severe pain. 90 tablet 0  . Nutritional Supplements (FEEDING SUPPLEMENT, JEVITY 1.5 CAL/FIBER,) LIQD Give 1 can Osmolite 1.2 + 1 can of Jevity 1.5 QID via PEG with 60 cc free water before and after bolus. Flush with 240 cc free water BID between feedings. 948 mL   . omeprazole (PRILOSEC) 20 MG capsule Take 1 capsule (20 mg total) by mouth 2 (two) times daily. 60 capsule 0  . SODIUM CHLORIDE, EXTERNAL, 0.9 % SOLN Use to clean around Trach and perform Trach care once daily and PRN 1000 mL 11  . triamcinolone (NASACORT AQ) 55 MCG/ACT AERO nasal inhaler Place 2 sprays into the nose daily. 1 Inhaler 12  . lactulose (CHRONULAC) 10 GM/15ML solution Take 15 mLs (10 g total) by mouth 3 (three) times daily. (Patient not taking: Reported on 02/13/2017) 473 mL 2  . lidocaine (XYLOCAINE) 2 % solution Use as directed 15 mLs in the mouth or throat every 8 (eight) hours as needed for mouth pain. (Patient not  taking: Reported on 01/09/2017) 100 mL 0  . LORazepam (ATIVAN) 1 MG tablet Take 1 tablet (1 mg total) by mouth 2 (two) times daily as needed for anxiety (or nausea). (Patient not taking: Reported on 02/13/2017) 10 tablet 0  . ondansetron (ZOFRAN) 8 MG tablet Take 1 tablet (8 mg total) by mouth every 8 (eight) hours as needed for nausea. (Patient not taking: Reported on 02/13/2017) 60 tablet 3  . polyethylene glycol (MIRALAX) packet Take 17 g by mouth daily. (Patient not taking: Reported on 01/09/2017) 14 each 0   Current Facility-Administered Medications  Medication Dose Route Frequency Provider Last Rate Last Dose  . guaiFENesin-dextromethorphan (ROBITUSSIN DM) 100-10 MG/5ML syrup 10 mL  10 mL Oral Q4H PRN [name], MD   10 mL at 01/09/17 1427   Facility-Administered Medications Ordered in Other Visits  Medication Dose Route Frequency Provider Last Rate Last Dose  . HYDROmorphone (DILAUDID) injection 2 mg  2 mg Intravenous Q2H PRN [name], MD   2 mg at 12/26/16 1440  . sodium chloride 0.9 % injection 10 mL  10 mL Intracatheter PRN [name], MD   10 mL at 01/02/17 1107  . sodium chloride flush (NS) 0.9 % injection 10 mL  10 mL Intracatheter PRN [name], MD   10 mL at 12/26/16 1708    PHYSICAL EXAMINATION: ECOG PERFORMANCE STATUS: 1 - Symptomatic but completely ambulatory  Vitals:   02/13/17 1228  BP: 127/76  Pulse: 89  Resp: 18  Temp: (!) 100.5 F (38.1 C)   Filed Weights   02/13/17 1228  Weight: 168 lb 1.6 oz (76.2 kg)    GENERAL:alert, no distress and comfortable SKIN: skin color, texture, turgor are normal, no rashes or significant lesions EYES: normal, Conjunctiva are pink and non-injected, sclera clear OROPHARYNX:no exudate, no erythema and lips, buccal mucosa, and tongue normal  NECK: Tracheostomy in situ LYMPH:  no palpable lymphadenopathy in the cervical, axillary or inguinal LUNGS: clear to auscultation and percussion with normal breathing effort HEART: regular  rate & rhythm and no murmurs and no lower extremity edema ABDOMEN:abdomen soft, non-tender and normal bowel sounds Musculoskeletal:no cyanosis of digits and no clubbing  NEURO: alert & oriented x 3 with fluent speech, no focal motor/sensory deficits  LABORATORY DATA:  I have reviewed the data as listed  Component Value Date/Time   NA 138 01/30/2017 1307   K 3.9 01/30/2017 1307   CL 99 (L) 12/27/2016 1748   CO2 26 01/30/2017 1307   GLUCOSE 104 01/30/2017 1307   BUN 16.7 01/30/2017 1307   CREATININE 0.8 01/30/2017 1307   CALCIUM 10.3 01/30/2017 1307   PROT 7.3 01/30/2017 1307   ALBUMIN 3.9 01/30/2017 1307   AST 17 01/30/2017 1307   ALT 41 01/30/2017 1307   ALKPHOS 118 01/30/2017 1307   BILITOT 0.36 01/30/2017 1307   GFRNONAA >60 12/27/2016 1748   GFRAA >60 12/27/2016 1748    No results found for: SPEP, UPEP  Lab Results  Component Value Date   WBC 10.4 (H) 01/30/2017   NEUTROABS 8.8 (H) 01/30/2017   HGB 12.2 (L) 01/30/2017   HCT 35.6 (L) 01/30/2017   MCV 98.1 (H) 01/30/2017   PLT 208 01/30/2017      Chemistry      Component Value Date/Time   NA 138 01/30/2017 1307   K 3.9 01/30/2017 1307   CL 99 (L) 12/27/2016 1748   CO2 26 01/30/2017 1307   BUN 16.7 01/30/2017 1307   CREATININE 0.8 01/30/2017 1307      Component Value Date/Time   CALCIUM 10.3 01/30/2017 1307   ALKPHOS 118 01/30/2017 1307   AST 17 01/30/2017 1307   ALT 41 01/30/2017 1307   BILITOT 0.36 01/30/2017 1307       RADIOGRAPHIC STUDIES: I have personally reviewed the radiological images as listed and agreed with the findings in the report. Ct Soft Tissue Neck W Contrast  Result Date: 02/10/2017 CLINICAL DATA:  Supraglottic cancer being treated with chemotherapy and radiation. EXAM: CT NECK WITH CONTRAST TECHNIQUE: Multidetector CT imaging of the neck was performed using the standard protocol following the bolus administration of intravenous contrast. CONTRAST:  132mL ISOVUE-300 IOPAMIDOL  (ISOVUE-300) INJECTION 61% COMPARISON:  12/13/2016 FINDINGS: Pharynx and larynx: Re- demonstration of a right supraglottic to glottic mass, perhaps a few mm smaller than on the previous study. Overall measurements today are 2.6 x 1.8 x 3 cm. Tumor extension along the right area epiglottic fold again demonstrated. No cartilage destruction seen. No new mucosal lesion. Tracheostomy continues to have a good appearance. Salivary glands: Parotid glands are normal. Submandibular glands are normal. Thyroid: Small gland with no focal abnormality on the right. No functioning thyroid tissue seen on the left. No evidence of mass in that region presently. Lymph nodes: No significant change since previous study. Right level 2 node 9 mm. Left level 2 node 10 mm. No other definable nodes. No new or progressive nodes. Vascular: Atherosclerotic change of the bifurcations but no stenosis. Jugular veins patent. Limited intracranial: Negative Visualized orbits: Not included Mastoids and visualized paranasal sinuses: Clear as visualized. Skeleton: No evidence of metastatic bone disease. Upper chest: Extensive pleural and parenchymal scarring with some emphysema. No mass or nodule. Other: None significant IMPRESSION: Very similar appearance to the study of January. Right sided supraglottic to glottic mass is similar, perhaps a few mm smaller. Bilateral enlarged nodes appear the same. No progressive finding. Electronically Signed   By: Nelson Chimes M.D.   On: 02/10/2017 10:48   Ct Abdomen Pelvis W Contrast  Result Date: 02/10/2017 CLINICAL DATA:  Supraglottic cancer, chemotherapy in progress, XRT complete. Periumbilical abdominal pain, weight loss. EXAM: CT ABDOMEN AND PELVIS WITH CONTRAST TECHNIQUE: Multidetector CT imaging of the abdomen and pelvis was performed using the standard protocol following bolus administration of intravenous contrast. CONTRAST:  155mL ISOVUE-300  IOPAMIDOL (ISOVUE-300) INJECTION 61% COMPARISON:  PET-CT dated  03/28/2016. CT chest dated 12/13/2016. CT abdomen/pelvis dated 01/02/2016. FINDINGS: Lower chest: Lung bases are clear. Hepatobiliary: Liver is within normal limits. No suspicious/enhancing hepatic lesions. Layering subcentimeter gallstones (series 2/ image 34), without associated inflammatory changes. No intrahepatic or extrahepatic ductal dilatation. Pancreas: Within normal limits. Spleen: Within normal limits. Adrenals/Urinary Tract: Adrenal glands are within normal limits. Kidneys are within normal limits.  No hydronephrosis. Bladder is within normal limits. Stomach/Bowel: Stomach is notable for a percutaneous gastrostomy in satisfactory position. No evidence of bowel obstruction. Normal appendix (series 2/image 70). Left colon is decompressed. Vascular/Lymphatic: No evidence of abdominal aortic aneurysm. Atherosclerotic calcifications of the abdominal aorta and branch vessels. No suspicious abdominopelvic lymphadenopathy. Reproductive: Prostate is unremarkable, noting dystrophic calcifications. Other: No suspicious abdominopelvic lymphadenopathy. Musculoskeletal: Visualized osseous structures are within normal limits. IMPRESSION: No evidence of metastatic disease in the abdomen/pelvis. Percutaneous gastrostomy in satisfactory position. Cholelithiasis, without associated inflammatory changes. Electronically Signed   By: Julian Hy M.D.   On: 02/10/2017 11:06   Ir Replc Gastro/colonic Tube Percut W/fluoro  Result Date: 02/10/2017 INDICATION: Routine gastrostomy exchange EXAM: FLUOROSCOPIC GUIDED REPLACEMENT OF GASTROSTOMY TUBE COMPARISON:  None. MEDICATIONS: None. CONTRAST:  7 mL ISOVUE-300 IOPAMIDOL (ISOVUE-300) INJECTION 61% administered into the gastric lumen FLUOROSCOPY TIME:  6 seconds (0.9 mGy) COMPLICATIONS: None immediate. PROCEDURE: The existing gastrostomy tube was removed and replaced with a new 20-French MIC balloon inflatable gastrostomy tube. The balloon was inflated and disc was cinched.  Contrast was injected and a spot fluoroscopic images was obtained in confirming appropriate intraluminal positioning. A dressing was placed. The patient tolerated the procedure well without immediate postprocedural complication. IMPRESSION: Successful fluoroscopic guided replacement of a new 20-French gastrostomy tube. The new gastrostomy tube is ready for immediate use. Electronically Signed   By: Sandi Mariscal M.D.   On: 02/10/2017 09:41    ASSESSMENT & PLAN:  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[facility]) Recent CT scan shows stable disease. He tolerated chemo well except for profound nausea and vomiting, relieved by stronger anti-emetics and 2 days of IV fluids following every treatment. We will resume treatment next week. His treatment cycle is day 1 and day 8, rest day 15 for a cycle of 21 days. I plan to see him back again in 4 weeks   Cancer associated pain This is overall stable He complained MS Contin caued too much sedation He can continue to take MS Contin at night and IR morphine as needed for pain during day time  Chemotherapy-induced nausea He has severe, uncontrolled nausea and vomiting after each dose of chemotherapy I will provide IV fluid support. We have changed his anti-emetics several times. CT scan of the abdomen showed no evidence of disease causing nausea. I suspect some of his nausea is from anticipatory nausea and possibly irritation from local feeding tube.   No orders of the defined types were placed in this encounter.  All questions were answered. The patient knows to call the clinic with any problems, questions or concerns. No barriers to learning was detected. I spent 15 minutes counseling the patient face to face. The total time spent in the appointment was 20 minutes and more than 50% was on counseling and review of test results     [name], MD 02/13/2017 1:10 PM

## 2017-02-13 NOTE — Assessment & Plan Note (Signed)
He has severe, uncontrolled nausea and vomiting after each dose of chemotherapy I will provide IV fluid support. We have changed his anti-emetics several times. CT scan of the abdomen showed no evidence of disease causing nausea. I suspect some of his nausea is from anticipatory nausea and possibly irritation from local feeding tube.

## 2017-02-13 NOTE — Assessment & Plan Note (Signed)
This is overall stable He complained MS Contin caued too much sedation He can continue to take MS Contin at night and IR morphine as needed for pain during day time 

## 2017-02-15 MED FILL — ONDANSETRON HCL 8 MG TABLET: 8 | 20 days supply | Qty: 60 | Fill #1

## 2017-02-15 MED FILL — LEVOTHYROXINE 50 MCG TABLET: 50 | 30 days supply | Qty: 30 | Fill #6

## 2017-02-15 MED FILL — OMEPRAZOLE 20 MG CAPSULE DR: 20 | 30 days supply | Qty: 60 | Fill #2

## 2017-02-17 ENCOUNTER — Telehealth: Payer: Self-pay | Admitting: Hematology and Oncology

## 2017-02-17 NOTE — Telephone Encounter (Signed)
This is my LOS instruction: 3/12: labs, flush and chemo 3/13 & 3/14: 2 hours IVF (he prefers [facility] if possible) 3/19: labs, flush and chemo 3/20 & 3/21: 2 hours IVF (he prefers [facility] if possible) 4/2: labs, flush see me at 1115 am, 30 mins and same day chemo  4/3 & 4/4: 2 hours IVF (he prefers [facility] if possible) 4/9: labs, flush and chemo 4/10 & 4/11: 2 hours IVF (he prefers [facility] if possible)

## 2017-02-17 NOTE — Telephone Encounter (Signed)
sw pt to confirm 3/12 appt at 0800 per LOS

## 2017-02-17 NOTE — Telephone Encounter (Signed)
Patient called and needs to schedule an appointment fut he is not sure what all he needs to see do (lab dr infusion ect)

## 2017-02-20 ENCOUNTER — Other Ambulatory Visit: Payer: Self-pay

## 2017-02-20 ENCOUNTER — Telehealth: Payer: Self-pay

## 2017-02-20 ENCOUNTER — Ambulatory Visit (HOSPITAL_BASED_OUTPATIENT_CLINIC_OR_DEPARTMENT_OTHER): Payer: Medicaid Other

## 2017-02-20 ENCOUNTER — Other Ambulatory Visit: Payer: Self-pay | Admitting: *Deleted

## 2017-02-20 ENCOUNTER — Other Ambulatory Visit: Payer: Self-pay | Admitting: Hematology and Oncology

## 2017-02-20 ENCOUNTER — Ambulatory Visit: Payer: Medicaid Other

## 2017-02-20 ENCOUNTER — Other Ambulatory Visit (HOSPITAL_BASED_OUTPATIENT_CLINIC_OR_DEPARTMENT_OTHER): Payer: Medicaid Other

## 2017-02-20 VITALS — BP 139/75 | HR 84 | Temp 99.5°F | Resp 18

## 2017-02-20 DIAGNOSIS — E039 Hypothyroidism, unspecified: Secondary | ICD-10-CM

## 2017-02-20 DIAGNOSIS — C321 Malignant neoplasm of supraglottis: Secondary | ICD-10-CM

## 2017-02-20 DIAGNOSIS — Z5111 Encounter for antineoplastic chemotherapy: Secondary | ICD-10-CM

## 2017-02-20 DIAGNOSIS — R5383 Other fatigue: Secondary | ICD-10-CM

## 2017-02-20 DIAGNOSIS — E038 Other specified hypothyroidism: Secondary | ICD-10-CM

## 2017-02-20 LAB — COMPREHENSIVE METABOLIC PANEL
ALBUMIN: 3.7 g/dL (ref 3.5–5.0)
ALK PHOS: 63 U/L (ref 40–150)
ALT: 8 U/L (ref 0–55)
AST: 12 U/L (ref 5–34)
Anion Gap: 9 mEq/L (ref 3–11)
BUN: 10.4 mg/dL (ref 7.0–26.0)
CALCIUM: 9.9 mg/dL (ref 8.4–10.4)
CO2: 29 mEq/L (ref 22–29)
CREATININE: 0.8 mg/dL (ref 0.7–1.3)
Chloride: 104 mEq/L (ref 98–109)
EGFR: >90 mL/min/{1.73_m2} (ref >90)
GLUCOSE: 107 mg/dL (ref 70–140)
Potassium: 4 mEq/L (ref 3.5–5.1)
SODIUM: 141 meq/L (ref 136–145)
TOTAL PROTEIN: 6.8 g/dL (ref 6.4–8.3)
Total Bilirubin: 0.29 mg/dL (ref 0.20–1.20)

## 2017-02-20 LAB — CBC WITH DIFFERENTIAL/PLATELET
BASO%: 0.5 % (ref 0.0–2.0)
BASOS ABS: 0 10*3/uL (ref 0.0–0.1)
EOS%: 1.4 % (ref 0.0–7.0)
Eosinophils Absolute: 0.1 10*3/uL (ref 0.0–0.5)
HCT: 36.5 % — ABNORMAL LOW (ref 38.4–49.9)
HEMOGLOBIN: 12.6 g/dL — AB (ref 13.0–17.1)
LYMPH#: 0.6 10*3/uL — AB (ref 0.9–3.3)
LYMPH%: 6.4 % — ABNORMAL LOW (ref 14.0–49.0)
MCH: 34.7 pg — ABNORMAL HIGH (ref 27.2–33.4)
MCHC: 34.6 g/dL (ref 32.0–36.0)
MCV: 100.3 fL — ABNORMAL HIGH (ref 79.3–98.0)
MONO#: 1 10*3/uL — ABNORMAL HIGH (ref 0.1–0.9)
MONO%: 10.5 % (ref 0.0–14.0)
NEUT%: 81.2 % — ABNORMAL HIGH (ref 39.0–75.0)
NEUTROS ABS: 7.8 10*3/uL — AB (ref 1.5–6.5)
Platelets: 207 10*3/uL (ref 140–400)
RBC: 3.64 10*6/uL — ABNORMAL LOW (ref 4.20–5.82)
RDW: 16.6 % — AB (ref 11.0–14.6)
WBC: 9.6 10*3/uL (ref 4.0–10.3)

## 2017-02-20 LAB — TSH: TSH: 5.757 m[IU]/L — AB (ref 0.320–4.118)

## 2017-02-20 MED ORDER — HYDROMORPHONE HCL 4 MG/ML IJ SOLN
INTRAMUSCULAR | Status: AC
  Filled 2017-02-20: qty 1

## 2017-02-20 MED ORDER — HEPARIN SOD (PORK) LOCK FLUSH 100 UNIT/ML IV SOLN
500.0000 [IU] | Freq: Once | INTRAVENOUS | Status: AC | PRN
  Administered 2017-02-20: 500 [IU]
  Filled 2017-02-20: qty 5

## 2017-02-20 MED ORDER — HYDROMORPHONE HCL 4 MG/ML IJ SOLN
2.0000 mg | INTRAMUSCULAR | Status: DC | PRN
  Administered 2017-02-20: 2 mg via INTRAVENOUS

## 2017-02-20 MED ORDER — SODIUM CHLORIDE 0.9 % IJ SOLN
10.0000 mL | INTRAMUSCULAR | Status: DC | PRN
  Administered 2017-02-20: 10 mL
  Filled 2017-02-20: qty 10

## 2017-02-20 MED ORDER — FAMOTIDINE IN NACL 20-0.9 MG/50ML-% IV SOLN
INTRAVENOUS | Status: AC
  Filled 2017-02-20: qty 50

## 2017-02-20 MED ORDER — PALONOSETRON HCL INJECTION 0.25 MG/5ML
INTRAVENOUS | Status: AC
  Filled 2017-02-20: qty 5

## 2017-02-20 MED ORDER — DIPHENHYDRAMINE HCL 50 MG/ML IJ SOLN
50.0000 mg | Freq: Once | INTRAMUSCULAR | Status: AC
  Administered 2017-02-20: 50 mg via INTRAVENOUS

## 2017-02-20 MED ORDER — SODIUM CHLORIDE 0.9 % IV SOLN
Freq: Once | INTRAVENOUS | Status: AC
  Administered 2017-02-20: 09:00:00 via INTRAVENOUS

## 2017-02-20 MED ORDER — SODIUM CHLORIDE 0.9 % IV SOLN
Freq: Once | INTRAVENOUS | Status: AC
  Administered 2017-02-20: 10:00:00 via INTRAVENOUS
  Filled 2017-02-20: qty 5

## 2017-02-20 MED ORDER — DIPHENHYDRAMINE HCL 50 MG/ML IJ SOLN
INTRAMUSCULAR | Status: AC
  Filled 2017-02-20: qty 1

## 2017-02-20 MED ORDER — LEVOTHYROXINE SODIUM 50 MCG PO TABS: 75.0000 ug | ORAL_TABLET | Freq: Every day | ORAL | 3 refills | Status: DC

## 2017-02-20 MED ORDER — FAMOTIDINE IN NACL 20-0.9 MG/50ML-% IV SOLN
20.0000 mg | Freq: Once | INTRAVENOUS | Status: AC
  Administered 2017-02-20: 20 mg via INTRAVENOUS

## 2017-02-20 MED ORDER — PALONOSETRON HCL INJECTION 0.25 MG/5ML
0.2500 mg | Freq: Once | INTRAVENOUS | Status: AC
  Administered 2017-02-20: 0.25 mg via INTRAVENOUS

## 2017-02-20 MED ORDER — MORPHINE SULFATE 30 MG PO TABS: 60.0000 mg | ORAL_TABLET | ORAL | 0 refills | Status: DC | PRN

## 2017-02-20 MED ORDER — MORPHINE SULFATE ER 60 MG PO TBCR: 60.0000 mg | EXTENDED_RELEASE_TABLET | Freq: Two times a day (BID) | ORAL | 0 refills | Status: DC

## 2017-02-20 MED ORDER — DEXTROSE 5 % IV SOLN
80.0000 mg/m2 | Freq: Once | INTRAVENOUS | Status: AC
  Administered 2017-02-20: 156 mg via INTRAVENOUS
  Filled 2017-02-20: qty 26

## 2017-02-20 MED ORDER — SODIUM CHLORIDE 0.9% FLUSH
10.0000 mL | INTRAVENOUS | Status: DC | PRN
  Administered 2017-02-20: 10 mL
  Filled 2017-02-20: qty 10

## 2017-02-20 MED FILL — LEVOTHYROXINE 50 MCG TABLET: 50 | 30 days supply | Qty: 45 | Fill #0

## 2017-02-20 NOTE — Telephone Encounter (Signed)
Called with below message. Patient states that he takes his medication consistently. Will call in Rx to his pharmacy.

## 2017-02-20 NOTE — Telephone Encounter (Signed)
Called patient, let him know hat his refill scripts for ms contin and msir  are ready to be picked up tomorrow at his appt.

## 2017-02-20 NOTE — Patient Instructions (Signed)
[facility] Discharge Instructions for Patients Receiving Chemotherapy  Today you received the following chemotherapy agents Taxol   To help prevent nausea and vomiting after your treatment, we encourage you to take your nausea medication as directed.   If you develop nausea and vomiting that is not controlled by your nausea medication, call the clinic.   BELOW ARE SYMPTOMS THAT SHOULD BE REPORTED IMMEDIATELY:  *FEVER GREATER THAN 100.5 F  *CHILLS WITH OR WITHOUT FEVER  NAUSEA AND VOMITING THAT IS NOT CONTROLLED WITH YOUR NAUSEA MEDICATION  *UNUSUAL SHORTNESS OF BREATH  *UNUSUAL BRUISING OR BLEEDING  TENDERNESS IN MOUTH AND THROAT WITH OR WITHOUT PRESENCE OF ULCERS  *URINARY PROBLEMS  *BOWEL PROBLEMS  UNUSUAL RASH Items with * indicate a potential emergency and should be followed up as soon as possible.  Feel free to call the clinic you have any questions or concerns. The clinic phone number is [phone].  Please show the CHEMO ALERT CARD at check-in to the Emergency Department and triage nurse.   

## 2017-02-20 NOTE — Progress Notes (Signed)
Pt reports diarrhea with every tube feeding for 3 days.  Reports approximately 4 times per day.  States he has not taken any medication for it.  Denies blood in stool.  Dr. Alvy Bimler notified okay to proceed with treatment today.  MD wants nutrition consult and educate patient on taking imodium if needed.  Message left with Ernestene Kiel, RD to f/u with patient regarding tube feeds and diarrhea.

## 2017-02-20 NOTE — Patient Instructions (Signed)
Implanted Port Home Guide An implanted port is a type of central line that is placed under the skin. Central lines are used to provide IV access when treatment or nutrition needs to be given through a person's veins. Implanted ports are used for long-term IV access. An implanted port may be placed because:  You need IV medicine that would be irritating to the small veins in your hands or arms.  You need long-term IV medicines, such as antibiotics.  You need IV nutrition for a long period.  You need frequent blood draws for lab tests.  You need dialysis.  Implanted ports are usually placed in the chest area, but they can also be placed in the upper arm, the abdomen, or the leg. An implanted port has two main parts:  Reservoir. The reservoir is round and will appear as a small, raised area under your skin. The reservoir is the part where a needle is inserted to give medicines or draw blood.  Catheter. The catheter is a thin, flexible tube that extends from the reservoir. The catheter is placed into a large vein. Medicine that is inserted into the reservoir goes into the catheter and then into the vein.  How will I care for my incision site? Do not get the incision site wet. Bathe or shower as directed by your health care provider. How is my port accessed? Special steps must be taken to access the port:  Before the port is accessed, a numbing cream can be placed on the skin. This helps numb the skin over the port site.  Your health care provider uses a sterile technique to access the port. ? Your health care provider must put on a mask and sterile gloves. ? The skin over your port is cleaned carefully with an antiseptic and allowed to dry. ? The port is gently pinched between sterile gloves, and a needle is inserted into the port.  Only "non-coring" port needles should be used to access the port. Once the port is accessed, a blood return should be checked. This helps ensure that the port  is in the vein and is not clogged.  If your port needs to remain accessed for a constant infusion, a clear (transparent) bandage will be placed over the needle site. The bandage and needle will need to be changed every week, or as directed by your health care provider.  Keep the bandage covering the needle clean and dry. Do not get it wet. Follow your health care provider's instructions on how to take a shower or bath while the port is accessed.  If your port does not need to stay accessed, no bandage is needed over the port.  What is flushing? Flushing helps keep the port from getting clogged. Follow your health care provider's instructions on how and when to flush the port. Ports are usually flushed with saline solution or a medicine called heparin. The need for flushing will depend on how the port is used.  If the port is used for intermittent medicines or blood draws, the port will need to be flushed: ? After medicines have been given. ? After blood has been drawn. ? As part of routine maintenance.  If a constant infusion is running, the port may not need to be flushed.  How long will my port stay implanted? The port can stay in for as long as your health care provider thinks it is needed. When it is time for the port to come out, surgery will be   done to remove it. The procedure is similar to the one performed when the port was put in. When should I seek immediate medical care? When you have an implanted port, you should seek immediate medical care if:  You notice a bad smell coming from the incision site.  You have swelling, redness, or drainage at the incision site.  You have more swelling or pain at the port site or the surrounding area.  You have a fever that is not controlled with medicine.  This information is not intended to replace advice given to you by your health care provider. Make sure you discuss any questions you have with your health care provider. Document  Released: 11/28/2005 Document Revised: 05/05/2016 Document Reviewed: 08/05/2013 Elsevier Interactive Patient Education  2017 [employer]  

## 2017-02-20 NOTE — Telephone Encounter (Signed)
-----   Message from [name], MD sent at 02/20/2017 11:01 AM EDT ----- Regarding: TSH TSH is high; his thyroid medication needs to be adjusted (unless he has not been taking it consistently) If he has been taking his medications, please increase to 75 mcg daily (please call in 30 days supply, 3 refills) ----- Message ----- From: Interface, [facility] Sent: 02/20/2017   8:42 AM To: [name], MD

## 2017-02-21 ENCOUNTER — Ambulatory Visit: Payer: Medicaid Other

## 2017-02-21 DIAGNOSIS — C321 Malignant neoplasm of supraglottis: Secondary | ICD-10-CM

## 2017-02-21 MED ORDER — SODIUM CHLORIDE 0.9 % IV SOLN: Freq: Once | INTRAVENOUS | Status: DC

## 2017-02-21 MED ORDER — ALUM & MAG HYDROXIDE-SIMETH 200-200-20 MG/5ML PO SUSP
30.0000 mL | Freq: Once | ORAL | Status: AC
  Administered 2017-02-21: 30 mL via ORAL
  Filled 2017-02-21: qty 30

## 2017-02-21 MED ORDER — SODIUM CHLORIDE 0.9 % IV SOLN
Freq: Once | INTRAVENOUS | Status: AC
  Administered 2017-02-21: 14:00:00 via INTRAVENOUS

## 2017-02-21 MED ORDER — ONDANSETRON HCL 4 MG/2ML IJ SOLN
INTRAMUSCULAR | Status: AC
  Filled 2017-02-21: qty 2

## 2017-02-21 MED ORDER — SODIUM CHLORIDE 0.9 % IJ SOLN
10.0000 mL | INTRAMUSCULAR | Status: DC | PRN
  Administered 2017-02-21: 10 mL
  Filled 2017-02-21: qty 10

## 2017-02-21 MED ORDER — HYDROMORPHONE HCL 4 MG/ML IJ SOLN
INTRAMUSCULAR | Status: AC
  Filled 2017-02-21: qty 1

## 2017-02-21 MED ORDER — HYDROMORPHONE HCL 4 MG/ML IJ SOLN
2.0000 mg | INTRAMUSCULAR | Status: DC | PRN
  Administered 2017-02-21: 2 mg via INTRAVENOUS

## 2017-02-21 MED ORDER — ONDANSETRON HCL 4 MG/2ML IJ SOLN
8.0000 mg | Freq: Once | INTRAMUSCULAR | Status: AC
  Administered 2017-02-21: 8 mg via INTRAVENOUS

## 2017-02-21 MED ORDER — HEPARIN SOD (PORK) LOCK FLUSH 100 UNIT/ML IV SOLN
500.0000 [IU] | Freq: Once | INTRAVENOUS | Status: AC | PRN
  Administered 2017-02-21: 500 [IU]
  Filled 2017-02-21: qty 5

## 2017-02-21 MED FILL — MORPHINE SULFATE IR 30 MG T: 30 | 7 days supply | Qty: 90 | Fill #0

## 2017-02-21 MED FILL — MORPHINE SULF 60 MG TAB SA: 60 | 30 days supply | Qty: 60 | Fill #0

## 2017-02-21 NOTE — Progress Notes (Signed)
Nutrition Follow-up:  Patient seen in infusion during IV fluids.  Patient reports diarrhea for the past 3 days everytime after taking tube feeding formula.  Technique for giving tube feeding has not changed per patient and tube feeding formula has not changed.  Reports that he is giving 4-6 cans of osmolite 1.5 and Jevity 1.5.  Reports that he is giving water flush 3 syringe fulls at time of feeding and gives water between feedings as well.  Reports that he is able to drink water and soda orally but does not eat any food.  Reports that he has only given couple cans of tube feeding since Friday due to diarrhea within 10-15 minutes of taking formula. Patient reports low grade fever as well,MD is aware.  Noted on 3/2 20 french gastrostomy tube was replaced and patient took CT contrast   Medications: reviewed  Labs: reviewed  Anthropometrics:   Noted weight of 168 pounds 1.6 oz on 3/5 decreased from 170 lb on 2/20. Patient reports his weight has been fluctuating between 168 and 172 pounds.     NUTRITION DIAGNOSIS: Unintentional weight loss continues    INTERVENTION:  Tube feeding likely not causing diarrhea at this time as patient has been on this regimen since April of 2017.  ?? If contrast may cause of diarrhea, fever??   Do not recommend any change in tube feeding regimen at this time.  Encouraged patient to take at least 6 cans of tube feeding to provide additional calories and protein.  Reports that he was not able to tolerate previous 6-8 can regimen. Discussed fluid needs and increase needs with fever.  Patient verbalized understanding.    MONITORING, EVALUATION, GOAL: Patient will tolerate tube feeding for weight maintenance   NEXT VISIT: March 20th  Kyle Alexander, [place], [place] Registered Dietitian [phone] (pager)

## 2017-02-22 ENCOUNTER — Ambulatory Visit (HOSPITAL_BASED_OUTPATIENT_CLINIC_OR_DEPARTMENT_OTHER): Payer: Medicaid Other | Admitting: Nurse Practitioner

## 2017-02-22 VITALS — BP 137/81 | HR 90 | Temp 98.7°F | Resp 18 | Wt 170.1 lb

## 2017-02-22 DIAGNOSIS — E860 Dehydration: Secondary | ICD-10-CM | POA: Diagnosis present

## 2017-02-22 DIAGNOSIS — C321 Malignant neoplasm of supraglottis: Secondary | ICD-10-CM

## 2017-02-22 MED ORDER — HYDROMORPHONE HCL 4 MG/ML IJ SOLN
2.0000 mg | INTRAMUSCULAR | Status: DC | PRN
  Administered 2017-02-22: 2 mg via INTRAVENOUS

## 2017-02-22 MED ORDER — ONDANSETRON HCL 4 MG/2ML IJ SOLN
8.0000 mg | Freq: Once | INTRAMUSCULAR | Status: AC
  Administered 2017-02-22: 8 mg via INTRAVENOUS

## 2017-02-22 MED ORDER — SODIUM CHLORIDE 0.9 % IJ SOLN
10.0000 mL | INTRAMUSCULAR | Status: DC | PRN
  Administered 2017-02-22: 10 mL
  Filled 2017-02-22: qty 10

## 2017-02-22 MED ORDER — HEPARIN SOD (PORK) LOCK FLUSH 100 UNIT/ML IV SOLN
500.0000 [IU] | Freq: Once | INTRAVENOUS | Status: AC | PRN
  Administered 2017-02-22: 500 [IU]
  Filled 2017-02-22: qty 5

## 2017-02-22 MED ORDER — SODIUM CHLORIDE 0.9 % IV SOLN: Freq: Once | INTRAVENOUS | Status: DC

## 2017-02-22 MED ORDER — ONDANSETRON HCL 4 MG/2ML IJ SOLN
INTRAMUSCULAR | Status: AC
  Filled 2017-02-22: qty 4

## 2017-02-22 MED ORDER — HYDROMORPHONE HCL 4 MG/ML IJ SOLN
INTRAMUSCULAR | Status: AC
  Filled 2017-02-22: qty 1

## 2017-02-22 MED ORDER — ALUM & MAG HYDROXIDE-SIMETH 200-200-20 MG/5ML PO SUSP
30.0000 mL | Freq: Once | ORAL | Status: AC
  Administered 2017-02-22: 30 mL via ORAL
  Filled 2017-02-22: qty 30

## 2017-02-22 MED ORDER — SODIUM CHLORIDE 0.9 % IV SOLN
Freq: Once | INTRAVENOUS | Status: AC
  Administered 2017-02-22: 13:00:00 via INTRAVENOUS

## 2017-02-22 NOTE — Progress Notes (Signed)
RN visit for IV Fluids

## 2017-02-22 NOTE — Patient Instructions (Signed)
Dehydration, Adult Dehydration is a condition in which there is not enough fluid or water in the body. This happens when you lose more fluids than you take in. Important organs, such as the kidneys, brain, and heart, cannot function without a proper amount of fluids. Any loss of fluids from the body can lead to dehydration. Dehydration can range from mild to severe. This condition should be treated right away to prevent it from becoming severe. What are the causes? This condition may be caused by:  Vomiting.  Diarrhea.  Excessive sweating, such as from heat exposure or exercise.  Not drinking enough fluid, especially:  When ill.  While doing activity that requires a lot of energy.  Excessive urination.  Fever.  Infection.  Certain medicines, such as medicines that cause the body to lose excess fluid (diuretics).  Inability to access safe drinking water.  Reduced physical ability to get adequate water and food. What increases the risk? This condition is more likely to develop in people:  Who have a poorly controlled long-term (chronic) illness, such as diabetes, heart disease, or kidney disease.  Who are age 65 or older.  Who are disabled.  Who live in a place with high altitude.  Who play endurance sports. What are the signs or symptoms? Symptoms of mild dehydration may include:   Thirst.  Dry lips.  Slightly dry mouth.  Dry, warm skin.  Dizziness. Symptoms of moderate dehydration may include:   Very dry mouth.  Muscle cramps.  Dark urine. Urine may be the color of tea.  Decreased urine production.  Decreased tear production.  Heartbeat that is irregular or faster than normal (palpitations).  Headache.  Light-headedness, especially when you stand up from a sitting position.  Fainting (syncope). Symptoms of severe dehydration may include:   Changes in skin, such as:  Cold and clammy skin.  Blotchy (mottled) or pale skin.  Skin that does  not quickly return to normal after being lightly pinched and released (poor skin turgor).  Changes in body fluids, such as:  Extreme thirst.  No tear production.  Inability to sweat when body temperature is high, such as in hot weather.  Very little urine production.  Changes in vital signs, such as:  Weak pulse.  Pulse that is more than 100 beats a minute when sitting still.  Rapid breathing.  Low blood pressure.  Other changes, such as:  Sunken eyes.  Cold hands and feet.  Confusion.  Lack of energy (lethargy).  Difficulty waking up from sleep.  Short-term weight loss.  Unconsciousness. How is this diagnosed? This condition is diagnosed based on your symptoms and a physical exam. Blood and urine tests may be done to help confirm the diagnosis. How is this treated? Treatment for this condition depends on the severity. Mild or moderate dehydration can often be treated at home. Treatment should be started right away. Do not wait until dehydration becomes severe. Severe dehydration is an emergency and it needs to be treated in a hospital. Treatment for mild dehydration may include:   Drinking more fluids.  Replacing salts and minerals in your blood (electrolytes) that you may have lost. Treatment for moderate dehydration may include:   Drinking an oral rehydration solution (ORS). This is a drink that helps you replace fluids and electrolytes (rehydrate). It can be found at pharmacies and retail stores. Treatment for severe dehydration may include:   Receiving fluids through an IV tube.  Receiving an electrolyte solution through a feeding tube that is   passed through your nose and into your stomach (nasogastric tube, or NG tube).  Correcting any abnormalities in electrolytes.  Treating the underlying cause of dehydration. Follow these instructions at home:  If directed by your health care provider, drink an ORS:  Make an ORS by following instructions on the  package.  Start by drinking small amounts, about  cup (120 mL) every 5-10 minutes.  Slowly increase how much you drink until you have taken the amount recommended by your health care provider.  Drink enough clear fluid to keep your urine clear or pale yellow. If you were told to drink an ORS, finish the ORS first, then start slowly drinking other clear fluids. Drink fluids such as:  Water. Do not drink only water. Doing that can lead to having too little salt (sodium) in the body (hyponatremia).  Ice chips.  Fruit juice that you have added water to (diluted fruit juice).  Low-calorie sports drinks.  Avoid:  Alcohol.  Drinks that contain a lot of sugar. These include high-calorie sports drinks, fruit juice that is not diluted, and soda.  Caffeine.  Foods that are greasy or contain a lot of fat or sugar.  Take over-the-counter and prescription medicines only as told by your health care provider.  Do not take sodium tablets. This can lead to having too much sodium in the body (hypernatremia).  Eat foods that contain a healthy balance of electrolytes, such as bananas, oranges, potatoes, tomatoes, and spinach.  Keep all follow-up visits as told by your health care provider. This is important. Contact a health care provider if:  You have abdominal pain that:  Gets worse.  Stays in one area (localizes).  You have a rash.  You have a stiff neck.  You are more irritable than usual.  You are sleepier or more difficult to wake up than usual.  You feel weak or dizzy.  You feel very thirsty.  You have urinated only a small amount of very dark urine over 6-8 hours. Get help right away if:  You have symptoms of severe dehydration.  You cannot drink fluids without vomiting.  Your symptoms get worse with treatment.  You have a fever.  You have a severe headache.  You have vomiting or diarrhea that:  Gets worse.  Does not go away.  You have blood or green matter  (bile) in your vomit.  You have blood in your stool. This may cause stool to look black and tarry.  You have not urinated in 6-8 hours.  You faint.  Your heart rate while sitting still is over 100 beats a minute.  You have trouble breathing. This information is not intended to replace advice given to you by your health care provider. Make sure you discuss any questions you have with your health care provider. Document Released: 11/28/2005 Document Revised: 06/24/2016 Document Reviewed: 01/22/2016 Elsevier Interactive Patient Education  2017 [employer]  

## 2017-02-27 ENCOUNTER — Ambulatory Visit: Payer: Medicaid Other

## 2017-02-27 ENCOUNTER — Other Ambulatory Visit: Payer: Self-pay | Admitting: *Deleted

## 2017-02-27 ENCOUNTER — Other Ambulatory Visit (HOSPITAL_BASED_OUTPATIENT_CLINIC_OR_DEPARTMENT_OTHER): Payer: Medicaid Other

## 2017-02-27 ENCOUNTER — Ambulatory Visit (HOSPITAL_BASED_OUTPATIENT_CLINIC_OR_DEPARTMENT_OTHER): Payer: Medicaid Other

## 2017-02-27 VITALS — BP 134/87 | HR 78 | Temp 98.5°F | Resp 18

## 2017-02-27 DIAGNOSIS — E039 Hypothyroidism, unspecified: Secondary | ICD-10-CM

## 2017-02-27 DIAGNOSIS — C321 Malignant neoplasm of supraglottis: Secondary | ICD-10-CM | POA: Diagnosis not present

## 2017-02-27 DIAGNOSIS — Z5111 Encounter for antineoplastic chemotherapy: Secondary | ICD-10-CM

## 2017-02-27 DIAGNOSIS — R5383 Other fatigue: Secondary | ICD-10-CM

## 2017-02-27 LAB — CBC WITH DIFFERENTIAL/PLATELET
BASO%: 0.3 % (ref 0.0–2.0)
BASOS ABS: 0 10*3/uL (ref 0.0–0.1)
EOS%: 2.3 % (ref 0.0–7.0)
Eosinophils Absolute: 0.2 10*3/uL (ref 0.0–0.5)
HEMATOCRIT: 35.3 % — AB (ref 38.4–49.9)
HGB: 11.8 g/dL — ABNORMAL LOW (ref 13.0–17.1)
LYMPH#: 0.7 10*3/uL — AB (ref 0.9–3.3)
LYMPH%: 6.3 % — AB (ref 14.0–49.0)
MCH: 33.3 pg (ref 27.2–33.4)
MCHC: 33.4 g/dL (ref 32.0–36.0)
MCV: 99.7 fL — ABNORMAL HIGH (ref 79.3–98.0)
MONO#: 0.5 10*3/uL (ref 0.1–0.9)
MONO%: 4.6 % (ref 0.0–14.0)
NEUT#: 9.1 10*3/uL — ABNORMAL HIGH (ref 1.5–6.5)
NEUT%: 86.5 % — AB (ref 39.0–75.0)
PLATELETS: 182 10*3/uL (ref 140–400)
RBC: 3.54 10*6/uL — AB (ref 4.20–5.82)
RDW: 15.6 % — AB (ref 11.0–14.6)
WBC: 10.5 10*3/uL — ABNORMAL HIGH (ref 4.0–10.3)

## 2017-02-27 LAB — COMPREHENSIVE METABOLIC PANEL
ALBUMIN: 3.9 g/dL (ref 3.5–5.0)
ALK PHOS: 54 U/L (ref 40–150)
ALT: 14 U/L (ref 0–55)
AST: 10 U/L (ref 5–34)
Anion Gap: 10 mEq/L (ref 3–11)
BILIRUBIN TOTAL: 0.23 mg/dL (ref 0.20–1.20)
BUN: 12.2 mg/dL (ref 7.0–26.0)
CO2: 27 meq/L (ref 22–29)
Calcium: 9.7 mg/dL (ref 8.4–10.4)
Chloride: 102 mEq/L (ref 98–109)
Creatinine: 0.7 mg/dL (ref 0.7–1.3)
GLUCOSE: 106 mg/dL (ref 70–140)
Potassium: 4.2 mEq/L (ref 3.5–5.1)
SODIUM: 139 meq/L (ref 136–145)
TOTAL PROTEIN: 6.9 g/dL (ref 6.4–8.3)

## 2017-02-27 LAB — TSH: TSH: 6.887 m(IU)/L — ABNORMAL HIGH (ref 0.320–4.118)

## 2017-02-27 MED ORDER — PALONOSETRON HCL INJECTION 0.25 MG/5ML
0.2500 mg | Freq: Once | INTRAVENOUS | Status: AC
  Administered 2017-02-27: 0.25 mg via INTRAVENOUS

## 2017-02-27 MED ORDER — HEPARIN SOD (PORK) LOCK FLUSH 100 UNIT/ML IV SOLN
500.0000 [IU] | Freq: Once | INTRAVENOUS | Status: AC | PRN
  Administered 2017-02-27: 500 [IU]
  Filled 2017-02-27: qty 5

## 2017-02-27 MED ORDER — HYDROMORPHONE HCL 4 MG/ML IJ SOLN
2.0000 mg | Freq: Once | INTRAMUSCULAR | Status: AC
  Administered 2017-02-27: 2 mg via INTRAVENOUS

## 2017-02-27 MED ORDER — SODIUM CHLORIDE 0.9% FLUSH
10.0000 mL | INTRAVENOUS | Status: DC | PRN
  Administered 2017-02-27: 10 mL
  Filled 2017-02-27: qty 10

## 2017-02-27 MED ORDER — PACLITAXEL CHEMO INJECTION 300 MG/50ML
80.0000 mg/m2 | Freq: Once | INTRAVENOUS | Status: AC
  Administered 2017-02-27: 156 mg via INTRAVENOUS
  Filled 2017-02-27: qty 26

## 2017-02-27 MED ORDER — SODIUM CHLORIDE 0.9 % IV SOLN
Freq: Once | INTRAVENOUS | Status: AC
  Administered 2017-02-27: 11:00:00 via INTRAVENOUS
  Filled 2017-02-27: qty 5

## 2017-02-27 MED ORDER — PALONOSETRON HCL INJECTION 0.25 MG/5ML
INTRAVENOUS | Status: AC
  Filled 2017-02-27: qty 5

## 2017-02-27 MED ORDER — HYDROMORPHONE HCL 4 MG/ML IJ SOLN
INTRAMUSCULAR | Status: AC
  Filled 2017-02-27: qty 1

## 2017-02-27 MED ORDER — SODIUM CHLORIDE 0.9 % IJ SOLN
10.0000 mL | Freq: Once | INTRAMUSCULAR | Status: AC
  Administered 2017-02-27: 10 mL
  Filled 2017-02-27: qty 10

## 2017-02-27 MED ORDER — FAMOTIDINE IN NACL 20-0.9 MG/50ML-% IV SOLN
20.0000 mg | Freq: Once | INTRAVENOUS | Status: AC
  Administered 2017-02-27: 20 mg via INTRAVENOUS

## 2017-02-27 MED ORDER — DIPHENHYDRAMINE HCL 50 MG/ML IJ SOLN
50.0000 mg | Freq: Once | INTRAMUSCULAR | Status: AC
  Administered 2017-02-27: 50 mg via INTRAVENOUS

## 2017-02-27 MED ORDER — LEVOTHYROXINE SODIUM 100 MCG PO TABS: 100.0000 ug | ORAL_TABLET | Freq: Every day | ORAL | 9 refills | Status: DC

## 2017-02-27 MED ORDER — DIPHENHYDRAMINE HCL 50 MG/ML IJ SOLN
INTRAMUSCULAR | Status: AC
  Filled 2017-02-27: qty 1

## 2017-02-27 MED ORDER — FAMOTIDINE IN NACL 20-0.9 MG/50ML-% IV SOLN
INTRAVENOUS | Status: AC
  Filled 2017-02-27: qty 50

## 2017-02-27 MED ORDER — SODIUM CHLORIDE 0.9 % IV SOLN
Freq: Once | INTRAVENOUS | Status: AC
  Administered 2017-02-27: 10:00:00 via INTRAVENOUS

## 2017-02-27 MED FILL — LEVOTHYROXINE 100 MCG TAB: 100 | 30 days supply | Qty: 30 | Fill #0

## 2017-02-27 NOTE — Patient Instructions (Signed)
[facility] Discharge Instructions for Patients Receiving Chemotherapy  Today you received the following chemotherapy agents Taxol   To help prevent nausea and vomiting after your treatment, we encourage you to take your nausea medication as directed.   If you develop nausea and vomiting that is not controlled by your nausea medication, call the clinic.   BELOW ARE SYMPTOMS THAT SHOULD BE REPORTED IMMEDIATELY:  *FEVER GREATER THAN 100.5 F  *CHILLS WITH OR WITHOUT FEVER  NAUSEA AND VOMITING THAT IS NOT CONTROLLED WITH YOUR NAUSEA MEDICATION  *UNUSUAL SHORTNESS OF BREATH  *UNUSUAL BRUISING OR BLEEDING  TENDERNESS IN MOUTH AND THROAT WITH OR WITHOUT PRESENCE OF ULCERS  *URINARY PROBLEMS  *BOWEL PROBLEMS  UNUSUAL RASH Items with * indicate a potential emergency and should be followed up as soon as possible.  Feel free to call the clinic you have any questions or concerns. The clinic phone number is [phone].  Please show the CHEMO ALERT CARD at check-in to the Emergency Department and triage nurse.   

## 2017-02-27 NOTE — Telephone Encounter (Signed)
Pt states he has been taking thyroid medicine. New prescription for Synthroid 100 mcg sent to [place]. Pt will pick it up when he leaves [facility]

## 2017-02-28 ENCOUNTER — Ambulatory Visit: Payer: Medicaid Other | Admitting: Nutrition

## 2017-02-28 ENCOUNTER — Telehealth: Payer: Self-pay | Admitting: *Deleted

## 2017-02-28 ENCOUNTER — Ambulatory Visit (HOSPITAL_BASED_OUTPATIENT_CLINIC_OR_DEPARTMENT_OTHER): Payer: Medicaid Other | Admitting: Nurse Practitioner

## 2017-02-28 VITALS — BP 122/71 | HR 104 | Temp 99.2°F | Resp 17 | Ht 70.0 in | Wt 169.8 lb

## 2017-02-28 DIAGNOSIS — C321 Malignant neoplasm of supraglottis: Secondary | ICD-10-CM

## 2017-02-28 DIAGNOSIS — R197 Diarrhea, unspecified: Secondary | ICD-10-CM

## 2017-02-28 DIAGNOSIS — R12 Heartburn: Secondary | ICD-10-CM

## 2017-02-28 MED ORDER — HYDROMORPHONE HCL 4 MG/ML IJ SOLN
INTRAMUSCULAR | Status: AC
  Filled 2017-02-28: qty 1

## 2017-02-28 MED ORDER — SODIUM CHLORIDE 0.9 % IV SOLN: Freq: Once | INTRAVENOUS | Status: DC

## 2017-02-28 MED ORDER — HEPARIN SOD (PORK) LOCK FLUSH 100 UNIT/ML IV SOLN
500.0000 [IU] | Freq: Once | INTRAVENOUS | Status: AC
  Administered 2017-02-28: 500 [IU]
  Filled 2017-02-28: qty 5

## 2017-02-28 MED ORDER — HYDROMORPHONE HCL 4 MG/ML IJ SOLN
2.0000 mg | Freq: Once | INTRAMUSCULAR | Status: AC
  Administered 2017-02-28: 2 mg via INTRAVENOUS

## 2017-02-28 MED ORDER — ONDANSETRON HCL 4 MG/2ML IJ SOLN
8.0000 mg | Freq: Once | INTRAMUSCULAR | Status: DC
  Administered 2017-02-28: 8 mg via INTRAVENOUS

## 2017-02-28 MED ORDER — LOPERAMIDE HCL 2 MG PO CAPS: 2.0000 mg | ORAL_CAPSULE | ORAL | 1 refills | Status: DC | PRN

## 2017-02-28 MED ORDER — ONDANSETRON HCL 4 MG/2ML IJ SOLN
INTRAMUSCULAR | Status: AC
  Filled 2017-02-28: qty 4

## 2017-02-28 MED ORDER — SODIUM CHLORIDE 0.9% FLUSH
10.0000 mL | Freq: Once | INTRAVENOUS | Status: AC
  Administered 2017-02-28: 10 mL
  Filled 2017-02-28: qty 10

## 2017-02-28 MED ORDER — SODIUM CHLORIDE 0.9 % IV SOLN
1000.0000 mL | Freq: Once | INTRAVENOUS | Status: AC
  Administered 2017-02-28: 1000 mL via INTRAVENOUS

## 2017-02-28 MED ORDER — ALUM & MAG HYDROXIDE-SIMETH 200-200-20 MG/5ML PO SUSP
30.0000 mL | Freq: Once | ORAL | Status: AC
  Administered 2017-02-28: 30 mL via ORAL
  Filled 2017-02-28: qty 30

## 2017-02-28 NOTE — Progress Notes (Signed)
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 Patient is receiving taxol. Weight is stable and documented as 169 pounds. Patient reports he continues to have one loose stool after each feeding. Reports he continues to use 3 cans Jevity 1.5 and 3 cans Osmolite 1.5. Reports free water flushes with tube feeding. Still is not eating food by mouth.  Nutrition diagnosis: Unintentional weight loss improved.  Intervention: Enforced importance of proper administration of current tube feeding. Encouraged patient to continue free water flushes and fluids by mouth as tolerated. Spoke with M.D. who reports patient should take Imodium after each loose stool. RN will be sure patient has written information on proper administration.  Monitoring, evaluation, goals: Patient will continue to tolerate tube feeding for weight maintenance/weight gain.  Next visit: Tuesday, March 27, during infusion of IV fluids.  **Disclaimer: This note was dictated with voice recognition software. Similar sounding words can inadvertently be transcribed and this note may contain transcription errors which may not have been corrected upon publication of note.**

## 2017-02-28 NOTE — Patient Instructions (Signed)
Dehydration, Adult Dehydration is a condition in which there is not enough fluid or water in the body. This happens when you lose more fluids than you take in. Important organs, such as the kidneys, brain, and heart, cannot function without a proper amount of fluids. Any loss of fluids from the body can lead to dehydration. Dehydration can range from mild to severe. This condition should be treated right away to prevent it from becoming severe. What are the causes? This condition may be caused by:  Vomiting.  Diarrhea.  Excessive sweating, such as from heat exposure or exercise.  Not drinking enough fluid, especially:  When ill.  While doing activity that requires a lot of energy.  Excessive urination.  Fever.  Infection.  Certain medicines, such as medicines that cause the body to lose excess fluid (diuretics).  Inability to access safe drinking water.  Reduced physical ability to get adequate water and food. What increases the risk? This condition is more likely to develop in people:  Who have a poorly controlled long-term (chronic) illness, such as diabetes, heart disease, or kidney disease.  Who are age 65 or older.  Who are disabled.  Who live in a place with high altitude.  Who play endurance sports. What are the signs or symptoms? Symptoms of mild dehydration may include:   Thirst.  Dry lips.  Slightly dry mouth.  Dry, warm skin.  Dizziness. Symptoms of moderate dehydration may include:   Very dry mouth.  Muscle cramps.  Dark urine. Urine may be the color of tea.  Decreased urine production.  Decreased tear production.  Heartbeat that is irregular or faster than normal (palpitations).  Headache.  Light-headedness, especially when you stand up from a sitting position.  Fainting (syncope). Symptoms of severe dehydration may include:   Changes in skin, such as:  Cold and clammy skin.  Blotchy (mottled) or pale skin.  Skin that does  not quickly return to normal after being lightly pinched and released (poor skin turgor).  Changes in body fluids, such as:  Extreme thirst.  No tear production.  Inability to sweat when body temperature is high, such as in hot weather.  Very little urine production.  Changes in vital signs, such as:  Weak pulse.  Pulse that is more than 100 beats a minute when sitting still.  Rapid breathing.  Low blood pressure.  Other changes, such as:  Sunken eyes.  Cold hands and feet.  Confusion.  Lack of energy (lethargy).  Difficulty waking up from sleep.  Short-term weight loss.  Unconsciousness. How is this diagnosed? This condition is diagnosed based on your symptoms and a physical exam. Blood and urine tests may be done to help confirm the diagnosis. How is this treated? Treatment for this condition depends on the severity. Mild or moderate dehydration can often be treated at home. Treatment should be started right away. Do not wait until dehydration becomes severe. Severe dehydration is an emergency and it needs to be treated in a hospital. Treatment for mild dehydration may include:   Drinking more fluids.  Replacing salts and minerals in your blood (electrolytes) that you may have lost. Treatment for moderate dehydration may include:   Drinking an oral rehydration solution (ORS). This is a drink that helps you replace fluids and electrolytes (rehydrate). It can be found at pharmacies and retail stores. Treatment for severe dehydration may include:   Receiving fluids through an IV tube.  Receiving an electrolyte solution through a feeding tube that is   passed through your nose and into your stomach (nasogastric tube, or NG tube).  Correcting any abnormalities in electrolytes.  Treating the underlying cause of dehydration. Follow these instructions at home:  If directed by your health care provider, drink an ORS:  Make an ORS by following instructions on the  package.  Start by drinking small amounts, about  cup (120 mL) every 5-10 minutes.  Slowly increase how much you drink until you have taken the amount recommended by your health care provider.  Drink enough clear fluid to keep your urine clear or pale yellow. If you were told to drink an ORS, finish the ORS first, then start slowly drinking other clear fluids. Drink fluids such as:  Water. Do not drink only water. Doing that can lead to having too little salt (sodium) in the body (hyponatremia).  Ice chips.  Fruit juice that you have added water to (diluted fruit juice).  Low-calorie sports drinks.  Avoid:  Alcohol.  Drinks that contain a lot of sugar. These include high-calorie sports drinks, fruit juice that is not diluted, and soda.  Caffeine.  Foods that are greasy or contain a lot of fat or sugar.  Take over-the-counter and prescription medicines only as told by your health care provider.  Do not take sodium tablets. This can lead to having too much sodium in the body (hypernatremia).  Eat foods that contain a healthy balance of electrolytes, such as bananas, oranges, potatoes, tomatoes, and spinach.  Keep all follow-up visits as told by your health care provider. This is important. Contact a health care provider if:  You have abdominal pain that:  Gets worse.  Stays in one area (localizes).  You have a rash.  You have a stiff neck.  You are more irritable than usual.  You are sleepier or more difficult to wake up than usual.  You feel weak or dizzy.  You feel very thirsty.  You have urinated only a small amount of very dark urine over 6-8 hours. Get help right away if:  You have symptoms of severe dehydration.  You cannot drink fluids without vomiting.  Your symptoms get worse with treatment.  You have a fever.  You have a severe headache.  You have vomiting or diarrhea that:  Gets worse.  Does not go away.  You have blood or green matter  (bile) in your vomit.  You have blood in your stool. This may cause stool to look black and tarry.  You have not urinated in 6-8 hours.  You faint.  Your heart rate while sitting still is over 100 beats a minute.  You have trouble breathing. This information is not intended to replace advice given to you by your health care provider. Make sure you discuss any questions you have with your health care provider. Document Released: 11/28/2005 Document Revised: 06/24/2016 Document Reviewed: 01/22/2016 Elsevier Interactive Patient Education  2017 [employer]  

## 2017-02-28 NOTE — Progress Notes (Signed)
RN visit for IV fluids. c/o some diarrhea within 10 minutes of tube feeds. Dietician , Dory Peru in to see pt. Overall, pt states he feels well-best he has felt in awhile.

## 2017-02-28 NOTE — Telephone Encounter (Signed)
[facility] nurse reports that patient needs trach revised and is wondering if heeds to have a barium swallow. States trach hole seems larger.  Pt has attempted to call Dr Warren Danes but has not gotten a return call.  Desk RN called Dr Erven Colla office- LM for nurse to call patient regarding trach.

## 2017-03-01 ENCOUNTER — Ambulatory Visit (HOSPITAL_BASED_OUTPATIENT_CLINIC_OR_DEPARTMENT_OTHER): Payer: Medicaid Other | Admitting: Nurse Practitioner

## 2017-03-01 VITALS — BP 126/63 | HR 92 | Temp 98.9°F | Resp 19 | Ht 70.0 in | Wt 172.7 lb

## 2017-03-01 DIAGNOSIS — Z5189 Encounter for other specified aftercare: Secondary | ICD-10-CM | POA: Diagnosis not present

## 2017-03-01 DIAGNOSIS — C321 Malignant neoplasm of supraglottis: Secondary | ICD-10-CM | POA: Diagnosis present

## 2017-03-01 MED ORDER — HYDROMORPHONE HCL 4 MG/ML IJ SOLN
INTRAMUSCULAR | Status: AC
  Filled 2017-03-01: qty 1

## 2017-03-01 MED ORDER — SODIUM CHLORIDE 0.9 % IV SOLN
Freq: Once | INTRAVENOUS | Status: AC
  Administered 2017-03-01: 13:00:00 via INTRAVENOUS

## 2017-03-01 MED ORDER — LORAZEPAM 2 MG/ML IJ SOLN
INTRAMUSCULAR | Status: AC
  Filled 2017-03-01: qty 1

## 2017-03-01 MED ORDER — LORAZEPAM 2 MG/ML IJ SOLN
0.5000 mg | Freq: Once | INTRAMUSCULAR | Status: AC | PRN
  Administered 2017-03-01: 0.5 mg via INTRAVENOUS

## 2017-03-01 MED ORDER — ALUM & MAG HYDROXIDE-SIMETH 200-200-20 MG/5ML PO SUSP
30.0000 mL | Freq: Once | ORAL | Status: AC
  Administered 2017-03-01: 30 mL via ORAL

## 2017-03-01 MED ORDER — HYDROMORPHONE HCL 4 MG/ML IJ SOLN
2.0000 mg | Freq: Once | INTRAMUSCULAR | Status: AC
  Administered 2017-03-01: 2 mg via INTRAVENOUS

## 2017-03-01 MED ORDER — HEPARIN SOD (PORK) LOCK FLUSH 100 UNIT/ML IV SOLN
500.0000 [IU] | Freq: Once | INTRAVENOUS | Status: AC | PRN
  Administered 2017-03-01: 500 [IU]
  Filled 2017-03-01: qty 5

## 2017-03-01 MED ORDER — SODIUM CHLORIDE 0.9 % IJ SOLN
10.0000 mL | INTRAMUSCULAR | Status: DC | PRN
  Administered 2017-03-01: 10 mL
  Filled 2017-03-01: qty 10

## 2017-03-01 NOTE — Progress Notes (Signed)
RN visit for IV fluids. 

## 2017-03-01 NOTE — Patient Instructions (Signed)
Dehydration, Adult Dehydration is a condition in which there is not enough fluid or water in the body. This happens when you lose more fluids than you take in. Important organs, such as the kidneys, brain, and heart, cannot function without a proper amount of fluids. Any loss of fluids from the body can lead to dehydration. Dehydration can range from mild to severe. This condition should be treated right away to prevent it from becoming severe. What are the causes? This condition may be caused by:  Vomiting.  Diarrhea.  Excessive sweating, such as from heat exposure or exercise.  Not drinking enough fluid, especially:  When ill.  While doing activity that requires a lot of energy.  Excessive urination.  Fever.  Infection.  Certain medicines, such as medicines that cause the body to lose excess fluid (diuretics).  Inability to access safe drinking water.  Reduced physical ability to get adequate water and food. What increases the risk? This condition is more likely to develop in people:  Who have a poorly controlled long-term (chronic) illness, such as diabetes, heart disease, or kidney disease.  Who are age 65 or older.  Who are disabled.  Who live in a place with high altitude.  Who play endurance sports. What are the signs or symptoms? Symptoms of mild dehydration may include:   Thirst.  Dry lips.  Slightly dry mouth.  Dry, warm skin.  Dizziness. Symptoms of moderate dehydration may include:   Very dry mouth.  Muscle cramps.  Dark urine. Urine may be the color of tea.  Decreased urine production.  Decreased tear production.  Heartbeat that is irregular or faster than normal (palpitations).  Headache.  Light-headedness, especially when you stand up from a sitting position.  Fainting (syncope). Symptoms of severe dehydration may include:   Changes in skin, such as:  Cold and clammy skin.  Blotchy (mottled) or pale skin.  Skin that does  not quickly return to normal after being lightly pinched and released (poor skin turgor).  Changes in body fluids, such as:  Extreme thirst.  No tear production.  Inability to sweat when body temperature is high, such as in hot weather.  Very little urine production.  Changes in vital signs, such as:  Weak pulse.  Pulse that is more than 100 beats a minute when sitting still.  Rapid breathing.  Low blood pressure.  Other changes, such as:  Sunken eyes.  Cold hands and feet.  Confusion.  Lack of energy (lethargy).  Difficulty waking up from sleep.  Short-term weight loss.  Unconsciousness. How is this diagnosed? This condition is diagnosed based on your symptoms and a physical exam. Blood and urine tests may be done to help confirm the diagnosis. How is this treated? Treatment for this condition depends on the severity. Mild or moderate dehydration can often be treated at home. Treatment should be started right away. Do not wait until dehydration becomes severe. Severe dehydration is an emergency and it needs to be treated in a hospital. Treatment for mild dehydration may include:   Drinking more fluids.  Replacing salts and minerals in your blood (electrolytes) that you may have lost. Treatment for moderate dehydration may include:   Drinking an oral rehydration solution (ORS). This is a drink that helps you replace fluids and electrolytes (rehydrate). It can be found at pharmacies and retail stores. Treatment for severe dehydration may include:   Receiving fluids through an IV tube.  Receiving an electrolyte solution through a feeding tube that is   passed through your nose and into your stomach (nasogastric tube, or NG tube).  Correcting any abnormalities in electrolytes.  Treating the underlying cause of dehydration. Follow these instructions at home:  If directed by your health care provider, drink an ORS:  Make an ORS by following instructions on the  package.  Start by drinking small amounts, about  cup (120 mL) every 5-10 minutes.  Slowly increase how much you drink until you have taken the amount recommended by your health care provider.  Drink enough clear fluid to keep your urine clear or pale yellow. If you were told to drink an ORS, finish the ORS first, then start slowly drinking other clear fluids. Drink fluids such as:  Water. Do not drink only water. Doing that can lead to having too little salt (sodium) in the body (hyponatremia).  Ice chips.  Fruit juice that you have added water to (diluted fruit juice).  Low-calorie sports drinks.  Avoid:  Alcohol.  Drinks that contain a lot of sugar. These include high-calorie sports drinks, fruit juice that is not diluted, and soda.  Caffeine.  Foods that are greasy or contain a lot of fat or sugar.  Take over-the-counter and prescription medicines only as told by your health care provider.  Do not take sodium tablets. This can lead to having too much sodium in the body (hypernatremia).  Eat foods that contain a healthy balance of electrolytes, such as bananas, oranges, potatoes, tomatoes, and spinach.  Keep all follow-up visits as told by your health care provider. This is important. Contact a health care provider if:  You have abdominal pain that:  Gets worse.  Stays in one area (localizes).  You have a rash.  You have a stiff neck.  You are more irritable than usual.  You are sleepier or more difficult to wake up than usual.  You feel weak or dizzy.  You feel very thirsty.  You have urinated only a small amount of very dark urine over 6-8 hours. Get help right away if:  You have symptoms of severe dehydration.  You cannot drink fluids without vomiting.  Your symptoms get worse with treatment.  You have a fever.  You have a severe headache.  You have vomiting or diarrhea that:  Gets worse.  Does not go away.  You have blood or green matter  (bile) in your vomit.  You have blood in your stool. This may cause stool to look black and tarry.  You have not urinated in 6-8 hours.  You faint.  Your heart rate while sitting still is over 100 beats a minute.  You have trouble breathing. This information is not intended to replace advice given to you by your health care provider. Make sure you discuss any questions you have with your health care provider. Document Released: 11/28/2005 Document Revised: 06/24/2016 Document Reviewed: 01/22/2016 Elsevier Interactive Patient Education  2017 [employer]  

## 2017-03-03 ENCOUNTER — Telehealth: Payer: Self-pay

## 2017-03-03 NOTE — Telephone Encounter (Signed)
Patient called and left message that he still has not heard from Dr. Noreene Filbert and he is trying to make appointment .  Called Dr. Noreene Filbert and talked with scheduler. Patient stating that trach hole seems larger and he is having more trouble with GERD reported to scheduler.  Patient given appt for Thursday 03-09-17 at 6:70 with Dr. [name], called patient and given appt information. Verbalized understanding.

## 2017-03-07 ENCOUNTER — Ambulatory Visit: Payer: Medicaid Other | Admitting: Nutrition

## 2017-03-07 NOTE — Progress Notes (Signed)
Patient's IV fluids were canceled.  Patient wanted to discuss nutrition follow-up over the telephone. Patient reports diarrhea has improved. Weight is improved and documented as 172 pounds. He has some concerns with his feeding tube.  Says he will have the doctor check this out. No other issues.  Encouraged patient to continue strategies for adequate calories and protein via tube feeding. Recommended oral nutrition intake by mouth as permitted. Questions answered.  Teach back method used.  Follow-up as needed.

## 2017-03-09 ENCOUNTER — Encounter: Payer: Self-pay | Admitting: Nurse Practitioner

## 2017-03-09 ENCOUNTER — Other Ambulatory Visit (HOSPITAL_BASED_OUTPATIENT_CLINIC_OR_DEPARTMENT_OTHER): Payer: Medicaid Other | Admitting: *Deleted

## 2017-03-09 ENCOUNTER — Other Ambulatory Visit: Payer: Self-pay | Admitting: *Deleted

## 2017-03-09 ENCOUNTER — Telehealth: Payer: Self-pay

## 2017-03-09 ENCOUNTER — Ambulatory Visit (HOSPITAL_BASED_OUTPATIENT_CLINIC_OR_DEPARTMENT_OTHER): Payer: Medicaid Other | Admitting: Nurse Practitioner

## 2017-03-09 ENCOUNTER — Other Ambulatory Visit: Payer: Medicaid Other

## 2017-03-09 VITALS — BP 112/68 | HR 93 | Temp 98.9°F | Resp 17 | Wt 168.4 lb

## 2017-03-09 DIAGNOSIS — C321 Malignant neoplasm of supraglottis: Secondary | ICD-10-CM

## 2017-03-09 DIAGNOSIS — R112 Nausea with vomiting, unspecified: Secondary | ICD-10-CM

## 2017-03-09 DIAGNOSIS — R5383 Other fatigue: Secondary | ICD-10-CM

## 2017-03-09 DIAGNOSIS — E860 Dehydration: Secondary | ICD-10-CM | POA: Insufficient documentation

## 2017-03-09 LAB — COMPREHENSIVE METABOLIC PANEL
ALBUMIN: 4 g/dL (ref 3.5–5.0)
ALK PHOS: 64 U/L (ref 40–150)
ALT: 16 U/L (ref 0–55)
ANION GAP: 11 meq/L (ref 3–11)
AST: 14 U/L (ref 5–34)
BILIRUBIN TOTAL: 0.26 mg/dL (ref 0.20–1.20)
BUN: 11.4 mg/dL (ref 7.0–26.0)
CO2: 27 mEq/L (ref 22–29)
Calcium: 10.1 mg/dL (ref 8.4–10.4)
Chloride: 102 mEq/L (ref 98–109)
Creatinine: 0.8 mg/dL (ref 0.7–1.3)
Glucose: 100 mg/dl (ref 70–140)
POTASSIUM: 3.6 meq/L (ref 3.5–5.1)
Sodium: 140 mEq/L (ref 136–145)
TOTAL PROTEIN: 7.2 g/dL (ref 6.4–8.3)

## 2017-03-09 LAB — CBC WITH DIFFERENTIAL/PLATELET
BASO%: 0.3 % (ref 0.0–2.0)
BASOS ABS: 0 10*3/uL (ref 0.0–0.1)
EOS ABS: 0.1 10*3/uL (ref 0.0–0.5)
EOS%: 0.9 % (ref 0.0–7.0)
HCT: 36.3 % — ABNORMAL LOW (ref 38.4–49.9)
HEMOGLOBIN: 12.3 g/dL — AB (ref 13.0–17.1)
LYMPH#: 1 10*3/uL (ref 0.9–3.3)
LYMPH%: 13.2 % — ABNORMAL LOW (ref 14.0–49.0)
MCH: 33.8 pg — AB (ref 27.2–33.4)
MCHC: 33.9 g/dL (ref 32.0–36.0)
MCV: 99.7 fL — AB (ref 79.3–98.0)
MONO#: 0.7 10*3/uL (ref 0.1–0.9)
MONO%: 9 % (ref 0.0–14.0)
NEUT#: 5.7 10*3/uL (ref 1.5–6.5)
NEUT%: 76.6 % — AB (ref 39.0–75.0)
NRBC: 0 % (ref 0–0)
PLATELETS: 223 10*3/uL (ref 140–400)
RBC: 3.64 10*6/uL — ABNORMAL LOW (ref 4.20–5.82)
RDW: 16.3 % — AB (ref 11.0–14.6)
WBC: 7.4 10*3/uL (ref 4.0–10.3)

## 2017-03-09 MED ORDER — ONDANSETRON HCL 4 MG/2ML IJ SOLN
8.0000 mg | Freq: Once | INTRAMUSCULAR | Status: AC
  Administered 2017-03-09: 8 mg via INTRAVENOUS

## 2017-03-09 MED ORDER — HEPARIN SOD (PORK) LOCK FLUSH 100 UNIT/ML IV SOLN
500.0000 [IU] | Freq: Once | INTRAVENOUS | Status: AC | PRN
  Administered 2017-03-09: 500 [IU]
  Filled 2017-03-09: qty 5

## 2017-03-09 MED ORDER — SODIUM CHLORIDE 0.9 % IJ SOLN
10.0000 mL | INTRAMUSCULAR | Status: DC | PRN
  Administered 2017-03-09: 10 mL
  Filled 2017-03-09: qty 10

## 2017-03-09 MED ORDER — ONDANSETRON HCL 4 MG/2ML IJ SOLN
INTRAMUSCULAR | Status: AC
  Filled 2017-03-09: qty 4

## 2017-03-09 MED ORDER — SODIUM CHLORIDE 0.9 % IV SOLN
Freq: Once | INTRAVENOUS | Status: AC
  Administered 2017-03-09: 14:00:00 via INTRAVENOUS

## 2017-03-09 NOTE — Progress Notes (Signed)
SYMPTOM MANAGEMENT CLINIC    Chief Complaint: Nausea, vomiting, dehydration  HPI:  Kyle Alexander 56 y.o. male diagnosed with supra glottis carcinoma.  Currently undergoing weekly Taxol chemotherapy regimen.     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[place])   03/15/2015 - 03/17/2015 Hospital Admission    He was admitted to the hospital for evaluation of dysphagia, SOB, hemoptosis, hoarseness, 30-40 pound weight loss and worsening bilateral neck masses for 5 months      03/15/2015 Imaging    Ct showed extensive circumferential malignancy in the hypopharyngeal/supraglottic region with regional LN metastases      03/16/2015 Procedure    He underwent ULTRASOUND-GUIDED BIOPSY OF LEFT CERVICAL LYMPH NODES      03/16/2015 Pathology Results    Accession: [id number] LN biopsy showed invasive squamous cell cancer      03/16/2015 Pathology Results    Accession: [id number] showed atypical squamous cells      03/25/2015 - 04/07/2015 Hospital Admission    He was admitted to the hospital and underwent tracheostomy placement, feeeding tube placement but subsequently left [facility]      03/26/2015 Surgery    He had multiple extraction of tooth numbers 1, 2, 5, 6, 7, 8, 9, 10, 11, 12, 13, 18, 19, 21, 22, 23, 24, 25, 26, 27, 28, and 29. and 4 Quadrants of alveoloplasty      03/26/2015 Surgery    He underwent tracheostomy      03/31/2015 Surgery    He had open gastrostomy tube placement by Dr. Donne Hazel      04/16/2015 - 05/18/2015 Radiation Therapy    Laryngopharynx and bilateral neck / 50 Gy in 20 fractions to gross disease, 45 Gy in 20 fractions to high risk nodal echelons  Beams/energy: Helical IMRT / 6 MV photons      04/16/2015 Procedure    Fluoroscopic reposition of the 18 French gastrostomy confirmed back in the stomach,      07/03/2015 Procedure    IR performed replacement of gastrostomy tube with a new 1 French balloon retention tube      10/01/2015 Imaging    PEt scan showed persistent  hypermetabolism within the primary supraglottic laryngeal tumor and within right retropharyngeal, bilateral level II and left level IV cervical nodal metastases      10/28/2015 Procedure    He had placement of PICC line. The IR was not able to place PORT due to suspected upper respiratory infection      11/13/2015 - 03/21/2016 Chemotherapy    He received 5FU, carboplatin chemo with weekly Erbitux      11/19/2015 Surgery    Gastrostomy tube replaced.      11/30/2015 Procedure    Placement of right jugular port-a-cath.      11/30/2015 Procedure    PICC removed.      12/30/2015 Imaging    PET CT showed positive response to Rx      02/26/2016 Procedure    He underwent direct laryngoscopy with biopsy. Esophageal dilatation.       02/26/2016 Pathology Results    Repeat biopsy of supraglottis showed persistent disease      02/29/2016 Procedure    Gastrostomy tube exchanged.      03/28/2016 PET scan    Hypermetabolic tissue in the posterior RIGHT hypopharynx is similar in pattern to PET-CT of 12/30/2015 but increased in metabolic activity.2. Hypermetabolic tissue / lymph nodes in the LEFT supraclavicular nodal station are in a similar pattern  04/04/2016 - 11/29/2016 Chemotherapy    He started on palliative chemotherapy with pembrolizumab      05/03/2016 Imaging    MRI head is negative      06/02/2016 Imaging    Mild improvement of diffuse pharyngeal and supraglottic edema.Increased asymmetry of right-sided hypopharyngeal/supraglottic softtissue compared to the prior neck CT with FDG uptake in this region on prior PET-CT. Residual/recurrent tumor is possible      08/09/2016 Procedure    IR placed new 20 French percutaneous gastrostomy tube.      08/26/2016 Imaging    Ct neck showed unchanged diffuse pharyngeal and supraglottic edema as well as asymmetric soft tissue thickening on the right. Slightly decreased size of some left level II lymph nodes. Unchanged  lymphadenopathy elsewhere in the neck. Decreased size of thyroid mass. No evidence of metastatic cancer to the chest      12/13/2016 Imaging    Ct chest showed no evidence of thoracic metastatic disease or primary thoracic malignancy.      12/13/2016 Imaging    Ct neck showed progression of of RIGHT supraglottic mass compared with priors, approximate size 24 x 27 x 27 mm. Extension caudally along the RIGHT area epiglottic fold with increasing mass effect on the airway. Tracheostomy satisfactory position. Stable to slightly improved malignant adenopathy.      12/26/2016 -  Chemotherapy    He received chemotherapy with weekly Taxol       02/10/2017 Imaging    Very similar appearance to the study of January. Right sided supraglottic to glottic mass is similar, perhaps a few mm smaller. Bilateral enlarged nodes appear the same. No progressive finding      02/10/2017 Imaging    No evidence of metastatic disease in the abdomen/pelvis. Percutaneous gastrostomy in satisfactory position. Cholelithiasis, without associated inflammatory changes.       Review of Systems  Constitutional: Positive for malaise/fatigue.  Gastrointestinal: Positive for nausea and vomiting.  All other systems reviewed and are negative.   Past Medical History:  Diagnosis Date  . Cancer ([place]) 03/16/15   left cervical lymph node / throat (2016)  . Fatigue 03/28/2016  . GERD (gastroesophageal reflux disease)   . Heartburn symptom 10/19/2015  . Hypertension   . Insomnia 01/22/2016  . Nausea & vomiting 12/15/2015  . S/P radiation therapy 04/16/2015-05/18/2015   Laryngopharynx and bilateral neck / 50 Gy in 20 fractions to gross disease, 45 Gy in 20 fractions to high risk nodal echelons   . Seizures ([place])   . Shortness of breath dyspnea   . Syncopal episodes 04/25/2016    Past Surgical History:  Procedure Laterality Date  . DIRECT LARYNGOSCOPY WITH BOTOX INJECTION N/A 02/26/2016   Procedure: DIRECT LARYNGOSCOPY ESOPHAGOSCOPY  DILATION  AND TRACH REPLACMENT;  Surgeon: [name], MD;  Location: [place];  Service: ENT;  Laterality: N/A;  . ESOPHAGOSCOPY WITH DILITATION N/A 02/26/2016   Procedure: ESOPHAGOSCOPY WITH DILITATION, PHARNGEAL BIOPSY;  Surgeon: [name], MD;  Location: [place];  Service: ENT;  Laterality: N/A;  . GASTROSTOMY N/A 03/31/2015   Procedure: OPEN GASTROSTOMY TUBE PLACEMENT;  Surgeon: Rolm Bookbinder, MD;  Location: [facility];  Service: General;  Laterality: N/A;  . HERNIA REPAIR     right groin  . IR GENERIC HISTORICAL  08/09/2016   IR GASTRIC TUBE PERC CHG W/O IMG GUIDE 08/09/2016 Jacqulynn Cadet, MD [facility]INTER[facility] RAD  . IR GENERIC HISTORICAL  02/10/2017   IR [place] TUBE PERCUT W/FLUORO 02/10/2017 Sandi Mariscal, MD  [facility]INTER[facility]  . KNEE SURGERY  1998   Left  . LYMPH NODE BIOPSY Left 03/16/15   left cervical, consistent with squamous cell carcinoma  . MULTIPLE EXTRACTIONS WITH ALVEOLOPLASTY N/A 03/26/2015   Procedure: EXTRACTION OF TOOTH #'S 1,2,5,6,7,8,9,10,11,12,13,18,19,21,22,23,24,25,26,27,28,29  WITH AVELOPLASTY;  Surgeon: Lenn Cal, DDS;  Location: [place];  Service: Oral Surgery;  Laterality: N/A;  . TONSILLECTOMY    . TONSILLECTOMY AND ADENOIDECTOMY    . TOOTH EXTRACTION  03/26/2015   Procedure: [place];  Surgeon: [name], MD;  Location: [place];  Service: ENT;;  . 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[place]); Hypomagnesemia; Tracheostomy care [facility]); Tracheostomy status ([place]); Gastrostomy  tube in place [facility]); Heartburn symptom; Nasal drainage; Anemia due to antineoplastic chemotherapy; Acneiform rash; Nausea & vomiting; Chemotherapy-induced nausea; Insomnia; Pancytopenia due to antineoplastic chemotherapy ([place]); Esophagitis, unspecified; Hemoptysis; Abscess of face; Metastasis to lymph nodes ([place]); Cancer associated pain; Dysphagia, cricopharyngeal; Fatigue; Syncopal episodes; Back skin lesion; Acquired hypothyroidism; Acute bronchitis with chronic obstructive pulmonary disease (COPD) ([place]); DNR (do not resuscitate); Peripheral neuropathy due to chemotherapy ([place]); Abdominal pain; Weight loss; and Dehydration on his problem list.    is allergic to aspirin.  Allergies as of 03/09/2017      Reactions   Aspirin    Makes me bleed, says this happened when he was a child      Medication List       Accurate as of 03/09/17  5:22 PM. Always use your most recent med list.          feeding supplement (JEVITY 1.5 CAL/FIBER) Liqd Give 1 can Osmolite 1.2 + 1 can of Jevity 1.5 QID via PEG with 60 cc free water before and after bolus. Flush with 240 cc free water BID between feedings.   fluticasone 50 MCG/ACT nasal spray Commonly known as:  FLONASE Place 2 sprays into both nostrils daily.   guaiFENesin 100 MG/5ML Soln Commonly known as:  ROBITUSSIN Take 5 mLs (100 mg total) by mouth every 4 (four) hours as needed for cough or to loosen phlegm.   lactulose 10 GM/15ML solution Commonly known as:  CHRONULAC Take 15 mLs (10 g total) by mouth 3 (three) times daily.   levothyroxine 100 MCG tablet Commonly known as:  SYNTHROID Take 1 tablet (100 mcg total) by mouth daily before breakfast.   lidocaine 2 % solution Commonly known as:  XYLOCAINE Use as directed 15 mLs in the mouth or throat every 8 (eight) hours as needed for mouth pain.   loperamide 2 MG capsule Commonly known as:  IMODIUM Take 1 capsule (2 mg total) by mouth as needed for diarrhea or loose stools.   LORazepam 1 MG  tablet Commonly known as:  ATIVAN Take 1 tablet (1 mg total) by mouth 2 (two) times daily as needed for anxiety (or nausea).   morphine 30 MG 12 hr tablet Commonly known as:  MS CONTIN Take 1 tablet (30 mg total) by mouth every 12 (twelve) hours.   morphine 30 MG tablet Commonly known as:  MSIR Take 2 tablets (60 mg total) by mouth every 4 (four) hours as needed for  severe pain.   morphine 60 MG 12 hr tablet Commonly known as:  MS CONTIN Take 1 tablet (60 mg total) by mouth every 12 (twelve) hours.   omeprazole 20 MG capsule Commonly known as:  PRILOSEC Take 1 capsule (20 mg total) by mouth 2 (two) times daily.   ondansetron 8 MG tablet Commonly known as:  ZOFRAN Take 1 tablet (8 mg total) by mouth every 8 (eight) hours as needed for nausea.   polyethylene glycol packet Commonly known as:  MIRALAX Take 17 g by mouth daily.   SODIUM CHLORIDE (EXTERNAL) 0.9 % Soln Use to clean around Trach and perform Trach care once daily and PRN   triamcinolone 55 MCG/ACT Aero nasal inhaler Commonly known as:  NASACORT AQ Place 2 sprays into the nose daily.        PHYSICAL EXAMINATION  Oncology Vitals 03/09/2017 03/01/2017  Height - 178 cm  Weight 76.403 kg 78.336 kg  Weight (lbs) 168 lbs 7 oz 172 lbs 11 oz  BMI (kg/m2) 24.17 kg/m2 24.78 kg/m2  Temp 98.9 98.9  Pulse 93 92  Resp 17 19  SpO2 100 100  BSA (m2) 1.94 m2 1.97 m2   BP Readings from Last 2 Encounters:  03/09/17 112/68  03/01/17 126/63    Physical Exam  Constitutional: He is oriented to person, place, and time. He appears dehydrated. He appears unhealthy.  HENT:  Head: Normocephalic and atraumatic.  Mouth/Throat: Oropharynx is clear and moist.  Trach intact.  Eyes: Conjunctivae and EOM are normal. Pupils are equal, round, and reactive to light. Right eye exhibits no discharge. Left eye exhibits no discharge. No scleral icterus.  Neck: Normal range of motion. Neck supple. No JVD present. No tracheal deviation  present. No thyromegaly present.  Cardiovascular: Normal rate, regular rhythm, normal heart sounds and intact distal pulses.   Pulmonary/Chest: Effort normal and breath sounds normal. No respiratory distress. He has no wheezes. He has no rales. He exhibits no tenderness.  Abdominal: Soft. Bowel sounds are normal. He exhibits no distension and no mass. There is no tenderness. There is no rebound and no guarding.  Left abdominal PEG intact.  Musculoskeletal: Normal range of motion. He exhibits no edema, tenderness or deformity.  Lymphadenopathy:    He has no cervical adenopathy.  Neurological: He is alert and oriented to person, place, and time. Gait normal.  Skin: Skin is warm and dry. No rash noted. No erythema. No pallor.  Psychiatric: Affect normal.  Nursing note and vitals reviewed.   LABORATORY DATA:. Orders Only on 03/09/2017  Component Date Value Ref Range Status  . WBC 03/09/2017 7.4  4.0 - 10.3 10e3/uL Final  . NEUT# 03/09/2017 5.7  1.5 - 6.5 10e3/uL Final  . HGB 03/09/2017 12.3* 13.0 - 17.1 g/dL Final  . HCT 03/09/2017 36.3* 38.4 - 49.9 % Final  . Platelets 03/09/2017 223  140 - 400 10e3/uL Final  . MCV 03/09/2017 99.7* 79.3 - 98.0 fL Final  . MCH 03/09/2017 33.8* 27.2 - 33.4 pg Final  . MCHC 03/09/2017 33.9  32.0 - 36.0 g/dL Final  . RBC 03/09/2017 3.64* 4.20 - 5.82 10e6/uL Final  . RDW 03/09/2017 16.3* 11.0 - 14.6 % Final  . lymph# 03/09/2017 1.0  0.9 - 3.3 10e3/uL Final  . MONO# 03/09/2017 0.7  0.1 - 0.9 10e3/uL Final  . Eosinophils Absolute 03/09/2017 0.1  0.0 - 0.5 10e3/uL Final  . Basophils Absolute 03/09/2017 0.0  0.0 - 0.1 10e3/uL Final  . NEUT% 03/09/2017 76.6* 39.0 -  75.0 % Final  . LYMPH% 03/09/2017 13.2* 14.0 - 49.0 % Final  . MONO% 03/09/2017 9.0  0.0 - 14.0 % Final  . EOS% 03/09/2017 0.9  0.0 - 7.0 % Final  . BASO% 03/09/2017 0.3  0.0 - 2.0 % Final  . nRBC 03/09/2017 0  0 - 0 % Final  . Sodium 03/09/2017 140  136 - 145 mEq/L Final  . Potassium 03/09/2017  3.6  3.5 - 5.1 mEq/L Final  . Chloride 03/09/2017 102  98 - 109 mEq/L Final  . CO2 03/09/2017 27  22 - 29 mEq/L Final  . Glucose 03/09/2017 100  70 - 140 mg/dl Final  . BUN 03/09/2017 11.4  7.0 - 26.0 mg/dL Final  . Creatinine 03/09/2017 0.8  0.7 - 1.3 mg/dL Final  . Total Bilirubin 03/09/2017 0.26  0.20 - 1.20 mg/dL Final  . Alkaline Phosphatase 03/09/2017 64  40 - 150 U/L Final  . AST 03/09/2017 14  5 - 34 U/L Final  . ALT 03/09/2017 16  0 - 55 U/L Final  . Total Protein 03/09/2017 7.2  6.4 - 8.3 g/dL Final  . Albumin 03/09/2017 4.0  3.5 - 5.0 g/dL Final  . Calcium 03/09/2017 10.1  8.4 - 10.4 mg/dL Final  . Anion Gap 03/09/2017 11  3 - 11 mEq/L Final  . EGFR 03/09/2017 >90  >90 ml/min/1.73 m2 Final    RADIOGRAPHIC STUDIES: No results found.  ASSESSMENT/PLAN:    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[facility]) Patient received his last weekly  Taxol on 02/27/2017.  He is scheduled to return on 03/13/2017 for labs, flush, visit, and his next cycle of chemotherapy.  Also, patient will return tomorrow to receive additional IV fluid rehydration.    Nausea & vomiting Patient states that he awoke this morning with multiple bouts of nausea and vomiting.  He states that the vomitus was gray and had some black in it as well.  He does not take any oral intake by mouth; receives all of his nutrition via a PEG tube.  He states that he continues to feed himself with the tube feeds 3-4 times per day and does not have any nausea or vomiting.  Directly following the administration of the tube feeds.  He denies any diarrhea; but states that he suffers with chronic constipation.  He states he typically has a bowel movement once every 5 days.  He does not use any stool softeners or laxatives to assist with the chronic constipation issues.  Patient denies any abdominal discomfort whatsoever.  He denies any recent fevers or chills.  On exam today.  Abdomen is soft and nontender.  PEG tube is intact.  Patient  appears well and states that he feels much better since he last vomited a few hours ago.  He states that he is hungry and will give himself some to fetus and is returned home.  Reviewed all findings with Dr. Alvy Bimler; and she recommended giving patient IV fluid rehydration today and carefully monitoring the patient.  Patient also plans to return tomorrow to receive additional IV fluid rehydration.  Patient was advised to go directly to the emergency department overnight.  If he develops any worsening symptoms whatsoever.  Dehydration Patient states that he awoke this morning with multiple bouts of nausea and vomiting.  He states that the vomitus was gray and had some black in it as well.  He does not take any oral intake by mouth; receives all of his nutrition via a PEG tube.  He states that he continues to feed himself with the tube feeds 3-4 times per day and does not have any nausea or vomiting.  Directly following the administration of the tube feeds.  He denies any diarrhea; but states that he suffers with chronic constipation.  He states he typically has a bowel movement once every 5 days.  He does not use any stool softeners or laxatives to assist with the chronic constipation issues.  Patient denies any abdominal discomfort whatsoever.  He denies any recent fevers or chills.  On exam today.  Abdomen is soft and nontender.  PEG tube is intact.  Patient appears well and states that he feels much better since he last vomited a few hours ago.  He states that he is hungry and will give himself some to fetus and is returned home.  Reviewed all findings with Dr. Alvy Bimler; and she recommended giving patient IV fluid rehydration today and carefully monitoring the patient.  Patient also plans to return tomorrow to receive additional IV fluid rehydration.  Patient was advised to go directly to the emergency department overnight.  If he develops any worsening symptoms whatsoever.   Patient stated  understanding of all instructions; and was in agreement with this plan of care. The patient knows to call the clinic with any problems, questions or concerns.   Total time spent with patient was 25 minutes;  with greater than 75 percent of that time spent in face to face counseling regarding patient's symptoms,  and coordination of care and follow up.  Disclaimer:This dictation was prepared with Dragon/digital dictation along with [employer]. Any transcriptional errors that result from this process are unintentional.  Drue Second, NP 03/09/2017

## 2017-03-09 NOTE — Telephone Encounter (Signed)
Patient called to say he was sick and coming into the cancer center. Ask by patient to call Dr. Noreene Filbert office and cancel appt for today at 2:20.  Called Dr. Noreene Filbert office and canceled appt for today, the scheduler will call patient to reschedule.

## 2017-03-09 NOTE — Assessment & Plan Note (Signed)
Patient received his last weekly  Taxol on 02/27/2017.  He is scheduled to return on 03/13/2017 for labs, flush, visit, and his next cycle of chemotherapy.  Also, patient will return tomorrow to receive additional IV fluid rehydration.

## 2017-03-09 NOTE — Patient Instructions (Signed)
Dehydration, Adult Dehydration is a condition in which there is not enough fluid or water in the body. This happens when you lose more fluids than you take in. Important organs, such as the kidneys, brain, and heart, cannot function without a proper amount of fluids. Any loss of fluids from the body can lead to dehydration. Dehydration can range from mild to severe. This condition should be treated right away to prevent it from becoming severe. What are the causes? This condition may be caused by:  Vomiting.  Diarrhea.  Excessive sweating, such as from heat exposure or exercise.  Not drinking enough fluid, especially:  When ill.  While doing activity that requires a lot of energy.  Excessive urination.  Fever.  Infection.  Certain medicines, such as medicines that cause the body to lose excess fluid (diuretics).  Inability to access safe drinking water.  Reduced physical ability to get adequate water and food. What increases the risk? This condition is more likely to develop in people:  Who have a poorly controlled long-term (chronic) illness, such as diabetes, heart disease, or kidney disease.  Who are age 65 or older.  Who are disabled.  Who live in a place with high altitude.  Who play endurance sports. What are the signs or symptoms? Symptoms of mild dehydration may include:   Thirst.  Dry lips.  Slightly dry mouth.  Dry, warm skin.  Dizziness. Symptoms of moderate dehydration may include:   Very dry mouth.  Muscle cramps.  Dark urine. Urine may be the color of tea.  Decreased urine production.  Decreased tear production.  Heartbeat that is irregular or faster than normal (palpitations).  Headache.  Light-headedness, especially when you stand up from a sitting position.  Fainting (syncope). Symptoms of severe dehydration may include:   Changes in skin, such as:  Cold and clammy skin.  Blotchy (mottled) or pale skin.  Skin that does  not quickly return to normal after being lightly pinched and released (poor skin turgor).  Changes in body fluids, such as:  Extreme thirst.  No tear production.  Inability to sweat when body temperature is high, such as in hot weather.  Very little urine production.  Changes in vital signs, such as:  Weak pulse.  Pulse that is more than 100 beats a minute when sitting still.  Rapid breathing.  Low blood pressure.  Other changes, such as:  Sunken eyes.  Cold hands and feet.  Confusion.  Lack of energy (lethargy).  Difficulty waking up from sleep.  Short-term weight loss.  Unconsciousness. How is this diagnosed? This condition is diagnosed based on your symptoms and a physical exam. Blood and urine tests may be done to help confirm the diagnosis. How is this treated? Treatment for this condition depends on the severity. Mild or moderate dehydration can often be treated at home. Treatment should be started right away. Do not wait until dehydration becomes severe. Severe dehydration is an emergency and it needs to be treated in a hospital. Treatment for mild dehydration may include:   Drinking more fluids.  Replacing salts and minerals in your blood (electrolytes) that you may have lost. Treatment for moderate dehydration may include:   Drinking an oral rehydration solution (ORS). This is a drink that helps you replace fluids and electrolytes (rehydrate). It can be found at pharmacies and retail stores. Treatment for severe dehydration may include:   Receiving fluids through an IV tube.  Receiving an electrolyte solution through a feeding tube that is   passed through your nose and into your stomach (nasogastric tube, or NG tube).  Correcting any abnormalities in electrolytes.  Treating the underlying cause of dehydration. Follow these instructions at home:  If directed by your health care provider, drink an ORS:  Make an ORS by following instructions on the  package.  Start by drinking small amounts, about  cup (120 mL) every 5-10 minutes.  Slowly increase how much you drink until you have taken the amount recommended by your health care provider.  Drink enough clear fluid to keep your urine clear or pale yellow. If you were told to drink an ORS, finish the ORS first, then start slowly drinking other clear fluids. Drink fluids such as:  Water. Do not drink only water. Doing that can lead to having too little salt (sodium) in the body (hyponatremia).  Ice chips.  Fruit juice that you have added water to (diluted fruit juice).  Low-calorie sports drinks.  Avoid:  Alcohol.  Drinks that contain a lot of sugar. These include high-calorie sports drinks, fruit juice that is not diluted, and soda.  Caffeine.  Foods that are greasy or contain a lot of fat or sugar.  Take over-the-counter and prescription medicines only as told by your health care provider.  Do not take sodium tablets. This can lead to having too much sodium in the body (hypernatremia).  Eat foods that contain a healthy balance of electrolytes, such as bananas, oranges, potatoes, tomatoes, and spinach.  Keep all follow-up visits as told by your health care provider. This is important. Contact a health care provider if:  You have abdominal pain that:  Gets worse.  Stays in one area (localizes).  You have a rash.  You have a stiff neck.  You are more irritable than usual.  You are sleepier or more difficult to wake up than usual.  You feel weak or dizzy.  You feel very thirsty.  You have urinated only a small amount of very dark urine over 6-8 hours. Get help right away if:  You have symptoms of severe dehydration.  You cannot drink fluids without vomiting.  Your symptoms get worse with treatment.  You have a fever.  You have a severe headache.  You have vomiting or diarrhea that:  Gets worse.  Does not go away.  You have blood or green matter  (bile) in your vomit.  You have blood in your stool. This may cause stool to look black and tarry.  You have not urinated in 6-8 hours.  You faint.  Your heart rate while sitting still is over 100 beats a minute.  You have trouble breathing. This information is not intended to replace advice given to you by your health care provider. Make sure you discuss any questions you have with your health care provider. Document Released: 11/28/2005 Document Revised: 06/24/2016 Document Reviewed: 01/22/2016 Elsevier Interactive Patient Education  2017 [employer]  

## 2017-03-09 NOTE — Assessment & Plan Note (Signed)
Patient states that he awoke this morning with multiple bouts of nausea and vomiting.  He states that the vomitus was gray and had some black in it as well.  He does not take any oral intake by mouth; receives all of his nutrition via a PEG tube.  He states that he continues to feed himself with the tube feeds 3-4 times per day and does not have any nausea or vomiting.  Directly following the administration of the tube feeds.  He denies any diarrhea; but states that he suffers with chronic constipation.  He states he typically has a bowel movement once every 5 days.  He does not use any stool softeners or laxatives to assist with the chronic constipation issues.  Patient denies any abdominal discomfort whatsoever.  He denies any recent fevers or chills.  On exam today.  Abdomen is soft and nontender.  PEG tube is intact.  Patient appears well and states that he feels much better since he last vomited a few hours ago.  He states that he is hungry and will give himself some to fetus and is returned home.  Reviewed all findings with Dr. Alvy Bimler; and she recommended giving patient IV fluid rehydration today and carefully monitoring the patient.  Patient also plans to return tomorrow to receive additional IV fluid rehydration.  Patient was advised to go directly to the emergency department overnight.  If he develops any worsening symptoms whatsoever.

## 2017-03-10 ENCOUNTER — Ambulatory Visit (HOSPITAL_BASED_OUTPATIENT_CLINIC_OR_DEPARTMENT_OTHER): Payer: Medicaid Other | Admitting: Nurse Practitioner

## 2017-03-10 VITALS — BP 124/73 | HR 76 | Temp 99.2°F | Resp 18 | Ht 70.0 in | Wt 168.4 lb

## 2017-03-10 DIAGNOSIS — R112 Nausea with vomiting, unspecified: Secondary | ICD-10-CM | POA: Diagnosis present

## 2017-03-10 DIAGNOSIS — C321 Malignant neoplasm of supraglottis: Secondary | ICD-10-CM | POA: Diagnosis not present

## 2017-03-10 MED ORDER — ONDANSETRON HCL 4 MG/2ML IJ SOLN
INTRAMUSCULAR | Status: AC
  Filled 2017-03-10: qty 4

## 2017-03-10 MED ORDER — SODIUM CHLORIDE 0.9 % IJ SOLN
10.0000 mL | INTRAMUSCULAR | Status: DC | PRN
  Administered 2017-03-10: 10 mL
  Filled 2017-03-10: qty 10

## 2017-03-10 MED ORDER — HEPARIN SOD (PORK) LOCK FLUSH 100 UNIT/ML IV SOLN
500.0000 [IU] | Freq: Once | INTRAVENOUS | Status: AC | PRN
  Administered 2017-03-10: 500 [IU]
  Filled 2017-03-10: qty 5

## 2017-03-10 MED ORDER — ONDANSETRON HCL 4 MG/2ML IJ SOLN
8.0000 mg | Freq: Once | INTRAMUSCULAR | Status: AC
  Administered 2017-03-10: 8 mg via INTRAVENOUS

## 2017-03-10 MED ORDER — ALUM & MAG HYDROXIDE-SIMETH 200-200-20 MG/5ML PO SUSP
30.0000 mL | Freq: Once | ORAL | Status: AC
  Administered 2017-03-10: 30 mL via ORAL
  Filled 2017-03-10: qty 30

## 2017-03-10 MED ORDER — SODIUM CHLORIDE 0.9 % IV SOLN
Freq: Once | INTRAVENOUS | Status: AC
  Administered 2017-03-10: 12:00:00 via INTRAVENOUS

## 2017-03-10 NOTE — Progress Notes (Signed)
RN visit only for IV fluids. 

## 2017-03-10 NOTE — Patient Instructions (Signed)
Dehydration, Adult Dehydration is a condition in which there is not enough fluid or water in the body. This happens when you lose more fluids than you take in. Important organs, such as the kidneys, brain, and heart, cannot function without a proper amount of fluids. Any loss of fluids from the body can lead to dehydration. Dehydration can range from mild to severe. This condition should be treated right away to prevent it from becoming severe. What are the causes? This condition may be caused by:  Vomiting.  Diarrhea.  Excessive sweating, such as from heat exposure or exercise.  Not drinking enough fluid, especially:  When ill.  While doing activity that requires a lot of energy.  Excessive urination.  Fever.  Infection.  Certain medicines, such as medicines that cause the body to lose excess fluid (diuretics).  Inability to access safe drinking water.  Reduced physical ability to get adequate water and food. What increases the risk? This condition is more likely to develop in people:  Who have a poorly controlled long-term (chronic) illness, such as diabetes, heart disease, or kidney disease.  Who are age 65 or older.  Who are disabled.  Who live in a place with high altitude.  Who play endurance sports. What are the signs or symptoms? Symptoms of mild dehydration may include:   Thirst.  Dry lips.  Slightly dry mouth.  Dry, warm skin.  Dizziness. Symptoms of moderate dehydration may include:   Very dry mouth.  Muscle cramps.  Dark urine. Urine may be the color of tea.  Decreased urine production.  Decreased tear production.  Heartbeat that is irregular or faster than normal (palpitations).  Headache.  Light-headedness, especially when you stand up from a sitting position.  Fainting (syncope). Symptoms of severe dehydration may include:   Changes in skin, such as:  Cold and clammy skin.  Blotchy (mottled) or pale skin.  Skin that does  not quickly return to normal after being lightly pinched and released (poor skin turgor).  Changes in body fluids, such as:  Extreme thirst.  No tear production.  Inability to sweat when body temperature is high, such as in hot weather.  Very little urine production.  Changes in vital signs, such as:  Weak pulse.  Pulse that is more than 100 beats a minute when sitting still.  Rapid breathing.  Low blood pressure.  Other changes, such as:  Sunken eyes.  Cold hands and feet.  Confusion.  Lack of energy (lethargy).  Difficulty waking up from sleep.  Short-term weight loss.  Unconsciousness. How is this diagnosed? This condition is diagnosed based on your symptoms and a physical exam. Blood and urine tests may be done to help confirm the diagnosis. How is this treated? Treatment for this condition depends on the severity. Mild or moderate dehydration can often be treated at home. Treatment should be started right away. Do not wait until dehydration becomes severe. Severe dehydration is an emergency and it needs to be treated in a hospital. Treatment for mild dehydration may include:   Drinking more fluids.  Replacing salts and minerals in your blood (electrolytes) that you may have lost. Treatment for moderate dehydration may include:   Drinking an oral rehydration solution (ORS). This is a drink that helps you replace fluids and electrolytes (rehydrate). It can be found at pharmacies and retail stores. Treatment for severe dehydration may include:   Receiving fluids through an IV tube.  Receiving an electrolyte solution through a feeding tube that is   passed through your nose and into your stomach (nasogastric tube, or NG tube).  Correcting any abnormalities in electrolytes.  Treating the underlying cause of dehydration. Follow these instructions at home:  If directed by your health care provider, drink an ORS:  Make an ORS by following instructions on the  package.  Start by drinking small amounts, about  cup (120 mL) every 5-10 minutes.  Slowly increase how much you drink until you have taken the amount recommended by your health care provider.  Drink enough clear fluid to keep your urine clear or pale yellow. If you were told to drink an ORS, finish the ORS first, then start slowly drinking other clear fluids. Drink fluids such as:  Water. Do not drink only water. Doing that can lead to having too little salt (sodium) in the body (hyponatremia).  Ice chips.  Fruit juice that you have added water to (diluted fruit juice).  Low-calorie sports drinks.  Avoid:  Alcohol.  Drinks that contain a lot of sugar. These include high-calorie sports drinks, fruit juice that is not diluted, and soda.  Caffeine.  Foods that are greasy or contain a lot of fat or sugar.  Take over-the-counter and prescription medicines only as told by your health care provider.  Do not take sodium tablets. This can lead to having too much sodium in the body (hypernatremia).  Eat foods that contain a healthy balance of electrolytes, such as bananas, oranges, potatoes, tomatoes, and spinach.  Keep all follow-up visits as told by your health care provider. This is important. Contact a health care provider if:  You have abdominal pain that:  Gets worse.  Stays in one area (localizes).  You have a rash.  You have a stiff neck.  You are more irritable than usual.  You are sleepier or more difficult to wake up than usual.  You feel weak or dizzy.  You feel very thirsty.  You have urinated only a small amount of very dark urine over 6-8 hours. Get help right away if:  You have symptoms of severe dehydration.  You cannot drink fluids without vomiting.  Your symptoms get worse with treatment.  You have a fever.  You have a severe headache.  You have vomiting or diarrhea that:  Gets worse.  Does not go away.  You have blood or green matter  (bile) in your vomit.  You have blood in your stool. This may cause stool to look black and tarry.  You have not urinated in 6-8 hours.  You faint.  Your heart rate while sitting still is over 100 beats a minute.  You have trouble breathing. This information is not intended to replace advice given to you by your health care provider. Make sure you discuss any questions you have with your health care provider. Document Released: 11/28/2005 Document Revised: 06/24/2016 Document Reviewed: 01/22/2016 Elsevier Interactive Patient Education  2017 [employer]  

## 2017-03-13 ENCOUNTER — Other Ambulatory Visit (HOSPITAL_BASED_OUTPATIENT_CLINIC_OR_DEPARTMENT_OTHER): Payer: Medicaid Other

## 2017-03-13 ENCOUNTER — Encounter: Payer: Self-pay | Admitting: Hematology and Oncology

## 2017-03-13 ENCOUNTER — Ambulatory Visit (HOSPITAL_BASED_OUTPATIENT_CLINIC_OR_DEPARTMENT_OTHER): Payer: Medicaid Other | Admitting: Hematology and Oncology

## 2017-03-13 ENCOUNTER — Other Ambulatory Visit: Payer: Self-pay | Admitting: Hematology and Oncology

## 2017-03-13 ENCOUNTER — Ambulatory Visit (HOSPITAL_BASED_OUTPATIENT_CLINIC_OR_DEPARTMENT_OTHER): Payer: Medicaid Other

## 2017-03-13 ENCOUNTER — Ambulatory Visit: Payer: Medicaid Other

## 2017-03-13 DIAGNOSIS — R5383 Other fatigue: Secondary | ICD-10-CM | POA: Diagnosis not present

## 2017-03-13 DIAGNOSIS — D6481 Anemia due to antineoplastic chemotherapy: Secondary | ICD-10-CM | POA: Diagnosis not present

## 2017-03-13 DIAGNOSIS — C321 Malignant neoplasm of supraglottis: Secondary | ICD-10-CM

## 2017-03-13 DIAGNOSIS — Z5111 Encounter for antineoplastic chemotherapy: Secondary | ICD-10-CM | POA: Diagnosis present

## 2017-03-13 DIAGNOSIS — C770 Secondary and unspecified malignant neoplasm of lymph nodes of head, face and neck: Secondary | ICD-10-CM

## 2017-03-13 DIAGNOSIS — T451X5A Adverse effect of antineoplastic and immunosuppressive drugs, initial encounter: Secondary | ICD-10-CM

## 2017-03-13 DIAGNOSIS — Z430 Encounter for attention to tracheostomy: Secondary | ICD-10-CM

## 2017-03-13 LAB — CBC WITH DIFFERENTIAL/PLATELET
BASO%: 0.7 % (ref 0.0–2.0)
Basophils Absolute: 0 10*3/uL (ref 0.0–0.1)
EOS ABS: 0.1 10*3/uL (ref 0.0–0.5)
EOS%: 1.2 % (ref 0.0–7.0)
HCT: 36.3 % — ABNORMAL LOW (ref 38.4–49.9)
HGB: 12.3 g/dL — ABNORMAL LOW (ref 13.0–17.1)
LYMPH%: 6.2 % — AB (ref 14.0–49.0)
MCH: 34.2 pg — ABNORMAL HIGH (ref 27.2–33.4)
MCHC: 33.9 g/dL (ref 32.0–36.0)
MCV: 100.9 fL — AB (ref 79.3–98.0)
MONO#: 0.8 10*3/uL (ref 0.1–0.9)
MONO%: 10.3 % (ref 0.0–14.0)
NEUT%: 81.6 % — AB (ref 39.0–75.0)
NEUTROS ABS: 6 10*3/uL (ref 1.5–6.5)
Platelets: 200 10*3/uL (ref 140–400)
RBC: 3.6 10*6/uL — AB (ref 4.20–5.82)
RDW: 16.5 % — ABNORMAL HIGH (ref 11.0–14.6)
WBC: 7.4 10*3/uL (ref 4.0–10.3)
lymph#: 0.5 10*3/uL — ABNORMAL LOW (ref 0.9–3.3)

## 2017-03-13 LAB — COMPREHENSIVE METABOLIC PANEL
ALT: 13 U/L (ref 0–55)
ANION GAP: 10 meq/L (ref 3–11)
AST: 13 U/L (ref 5–34)
Albumin: 4 g/dL (ref 3.5–5.0)
Alkaline Phosphatase: 58 U/L (ref 40–150)
BUN: 11.5 mg/dL (ref 7.0–26.0)
CALCIUM: 9.8 mg/dL (ref 8.4–10.4)
CHLORIDE: 104 meq/L (ref 98–109)
CO2: 27 mEq/L (ref 22–29)
CREATININE: 0.7 mg/dL (ref 0.7–1.3)
EGFR: >90 mL/min/{1.73_m2} (ref >90)
Glucose: 104 mg/dl (ref 70–140)
Potassium: 4.1 mEq/L (ref 3.5–5.1)
SODIUM: 141 meq/L (ref 136–145)
TOTAL PROTEIN: 6.8 g/dL (ref 6.4–8.3)
Total Bilirubin: 0.29 mg/dL (ref 0.20–1.20)

## 2017-03-13 LAB — TSH: TSH: 1.265 m(IU)/L (ref 0.320–4.118)

## 2017-03-13 MED ORDER — DIPHENHYDRAMINE HCL 50 MG/ML IJ SOLN
INTRAMUSCULAR | Status: AC
  Filled 2017-03-13: qty 1

## 2017-03-13 MED ORDER — DEXAMETHASONE SODIUM PHOSPHATE 100 MG/10ML IJ SOLN: 20.0000 mg | Freq: Once | INTRAMUSCULAR | Status: DC

## 2017-03-13 MED ORDER — PALONOSETRON HCL INJECTION 0.25 MG/5ML
0.2500 mg | Freq: Once | INTRAVENOUS | Status: AC
  Administered 2017-03-13: 0.25 mg via INTRAVENOUS

## 2017-03-13 MED ORDER — SODIUM CHLORIDE 0.9% FLUSH
10.0000 mL | Freq: Once | INTRAVENOUS | Status: AC
  Administered 2017-03-13: 10 mL
  Filled 2017-03-13: qty 10

## 2017-03-13 MED ORDER — FAMOTIDINE IN NACL 20-0.9 MG/50ML-% IV SOLN
20.0000 mg | Freq: Once | INTRAVENOUS | Status: AC
  Administered 2017-03-13: 20 mg via INTRAVENOUS

## 2017-03-13 MED ORDER — DIPHENHYDRAMINE HCL 50 MG/ML IJ SOLN
50.0000 mg | Freq: Once | INTRAMUSCULAR | Status: AC
  Administered 2017-03-13: 50 mg via INTRAVENOUS

## 2017-03-13 MED ORDER — SODIUM CHLORIDE 0.9 % IV SOLN: Freq: Once | INTRAVENOUS | Status: DC

## 2017-03-13 MED ORDER — SODIUM CHLORIDE 0.9% FLUSH
10.0000 mL | INTRAVENOUS | Status: DC | PRN
  Administered 2017-03-13: 10 mL
  Filled 2017-03-13: qty 10

## 2017-03-13 MED ORDER — SODIUM CHLORIDE 0.9 % IV SOLN
Freq: Once | INTRAVENOUS | Status: AC
  Administered 2017-03-13: 11:00:00 via INTRAVENOUS
  Filled 2017-03-13: qty 5

## 2017-03-13 MED ORDER — HEPARIN SOD (PORK) LOCK FLUSH 100 UNIT/ML IV SOLN
500.0000 [IU] | Freq: Once | INTRAVENOUS | Status: AC | PRN
  Administered 2017-03-13: 500 [IU]
  Filled 2017-03-13: qty 5

## 2017-03-13 MED ORDER — FAMOTIDINE IN NACL 20-0.9 MG/50ML-% IV SOLN
INTRAVENOUS | Status: AC
  Filled 2017-03-13: qty 50

## 2017-03-13 MED ORDER — PACLITAXEL CHEMO INJECTION 300 MG/50ML
80.0000 mg/m2 | Freq: Once | INTRAVENOUS | Status: AC
  Administered 2017-03-13: 156 mg via INTRAVENOUS
  Filled 2017-03-13: qty 26

## 2017-03-13 MED ORDER — PALONOSETRON HCL INJECTION 0.25 MG/5ML
INTRAVENOUS | Status: AC
  Filled 2017-03-13: qty 5

## 2017-03-13 NOTE — Patient Instructions (Signed)
[facility] Discharge Instructions for Patients Receiving Chemotherapy  Today you received the following chemotherapy agents Taxol   To help prevent nausea and vomiting after your treatment, we encourage you to take your nausea medication as directed.   If you develop nausea and vomiting that is not controlled by your nausea medication, call the clinic.   BELOW ARE SYMPTOMS THAT SHOULD BE REPORTED IMMEDIATELY:  *FEVER GREATER THAN 100.5 F  *CHILLS WITH OR WITHOUT FEVER  NAUSEA AND VOMITING THAT IS NOT CONTROLLED WITH YOUR NAUSEA MEDICATION  *UNUSUAL SHORTNESS OF BREATH  *UNUSUAL BRUISING OR BLEEDING  TENDERNESS IN MOUTH AND THROAT WITH OR WITHOUT PRESENCE OF ULCERS  *URINARY PROBLEMS  *BOWEL PROBLEMS  UNUSUAL RASH Items with * indicate a potential emergency and should be followed up as soon as possible.  Feel free to call the clinic you have any questions or concerns. The clinic phone number is [phone].  Please show the CHEMO ALERT CARD at check-in to the Emergency Department and triage nurse.   

## 2017-03-13 NOTE — Assessment & Plan Note (Signed)
This is likely due to recent treatment. The patient denies recent history of bleeding such as epistaxis, hematuria or hematochezia. He is asymptomatic from the anemia. I will observe for now.  He does not require transfusion now. I will continue the chemotherapy at current dose without dosage adjustment.  If the anemia gets progressive worse in the future, I might have to delay his treatment or adjust the chemotherapy dose.  

## 2017-03-13 NOTE — Assessment & Plan Note (Signed)
Examination is stable Continue close observation

## 2017-03-13 NOTE — Assessment & Plan Note (Signed)
He denies recent bleeding from the tracheostomy site. I recommend he continue close follow-up to see his ENT doctor for tracheostomy site care

## 2017-03-13 NOTE — Assessment & Plan Note (Signed)
He tolerated treatment well except for nausea We will proceed with treatment without dose adjustment, on days 1 and 8 and rest day 15, for cycle of every 21 days I will see him on day 1 of every cycle He will continue treatment without dose adjustment His next imaging study would be due in June

## 2017-03-13 NOTE — Progress Notes (Signed)
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[place])   03/15/2015 - 03/17/2015 Hospital Admission    He was admitted to the hospital for evaluation of dysphagia, SOB, hemoptosis, hoarseness, 30-40 pound weight loss and worsening bilateral neck masses for 5 months      03/15/2015 Imaging    Ct showed extensive circumferential malignancy in the hypopharyngeal/supraglottic region with regional LN metastases      03/16/2015 Procedure    He underwent ULTRASOUND-GUIDED BIOPSY OF LEFT CERVICAL LYMPH NODES      03/16/2015 Pathology Results    Accession: [id number] LN biopsy showed invasive squamous cell cancer      03/16/2015 Pathology Results    Accession: [id number] showed atypical squamous cells      03/25/2015 - 04/07/2015 Hospital Admission    He was admitted to the hospital and underwent tracheostomy placement, feeeding tube placement but subsequently left [facility]      03/26/2015 Surgery    He had multiple extraction of tooth numbers 1, 2, 5, 6, 7, 8, 9, 10, 11, 12, 13, 18, 19, 21, 22, 23, 24, 25, 26, 27, 28, and 29. and 4 Quadrants of alveoloplasty      03/26/2015 Surgery    He underwent tracheostomy      03/31/2015 Surgery    He had open gastrostomy tube placement by Dr. Donne Hazel      04/16/2015 - 05/18/2015 Radiation Therapy    Laryngopharynx and bilateral neck / 50 Gy in 20 fractions to gross disease, 45 Gy in 20 fractions to high risk nodal echelons  Beams/energy: Helical IMRT / 6 MV photons      04/16/2015 Procedure    Fluoroscopic reposition of the 18 French gastrostomy confirmed back in the stomach,      07/03/2015 Procedure    IR performed  replacement of gastrostomy tube with a new 70 French balloon retention tube      10/01/2015 Imaging    PEt scan showed persistent hypermetabolism within the primary supraglottic laryngeal tumor and within right retropharyngeal, bilateral level II and left level IV cervical nodal metastases      10/28/2015 Procedure    He had placement of PICC line. The IR was not able to place PORT due to suspected upper respiratory infection      11/13/2015 - 03/21/2016 Chemotherapy    He received 5FU, carboplatin chemo with weekly Erbitux      11/19/2015 Surgery    Gastrostomy tube replaced.      11/30/2015 Procedure    Placement of right jugular port-a-cath.      11/30/2015 Procedure    PICC removed.      12/30/2015 Imaging    PET CT showed positive response to Rx      02/26/2016 Procedure    He underwent direct laryngoscopy with biopsy. Esophageal dilatation.       02/26/2016 Pathology Results    Repeat biopsy of supraglottis showed persistent disease      02/29/2016 Procedure    Gastrostomy tube exchanged.      03/28/2016 PET scan    Hypermetabolic tissue in the posterior RIGHT hypopharynx is similar in pattern to PET-CT of 12/30/2015 but increased in metabolic activity.2. Hypermetabolic tissue /  lymph nodes in the LEFT supraclavicular nodal station are in a similar pattern       04/04/2016 - 11/29/2016 Chemotherapy    He started on palliative chemotherapy with pembrolizumab      05/03/2016 Imaging    MRI head is negative      06/02/2016 Imaging    Mild improvement of diffuse pharyngeal and supraglottic edema.Increased asymmetry of right-sided hypopharyngeal/supraglottic softtissue compared to the prior neck CT with FDG uptake in this region on prior PET-CT. Residual/recurrent tumor is possible      08/09/2016 Procedure    IR placed new 20 French percutaneous gastrostomy tube.      08/26/2016 Imaging    Ct neck showed unchanged diffuse pharyngeal and supraglottic edema as well  as asymmetric soft tissue thickening on the right. Slightly decreased size of some left level II lymph nodes. Unchanged lymphadenopathy elsewhere in the neck. Decreased size of thyroid mass. No evidence of metastatic cancer to the chest      12/13/2016 Imaging    Ct chest showed no evidence of thoracic metastatic disease or primary thoracic malignancy.      12/13/2016 Imaging    Ct neck showed progression of of RIGHT supraglottic mass compared with priors, approximate size 24 x 27 x 27 mm. Extension caudally along the RIGHT area epiglottic fold with increasing mass effect on the airway. Tracheostomy satisfactory position. Stable to slightly improved malignant adenopathy.      12/26/2016 -  Chemotherapy    He received chemotherapy with weekly Taxol       02/10/2017 Imaging    Very similar appearance to the study of January. Right sided supraglottic to glottic mass is similar, perhaps a few mm smaller. Bilateral enlarged nodes appear the same. No progressive finding      02/10/2017 Imaging    No evidence of metastatic disease in the abdomen/pelvis. Percutaneous gastrostomy in satisfactory position. Cholelithiasis, without associated inflammatory changes.       INTERVAL HISTORY: Please see below for problem oriented charting. He is seen in the infusion room He feels well Denies recent nausea or vomiting Denies pain No recent hemoptysis or bleeding from his tracheostomy site  REVIEW OF SYSTEMS:   Constitutional: Denies fevers, chills or abnormal weight loss Eyes: Denies blurriness of vision Ears, nose, mouth, throat, and face: Denies mucositis or sore throat Respiratory: Denies cough, dyspnea or wheezes Cardiovascular: Denies palpitation, chest discomfort or lower extremity swelling Gastrointestinal:  Denies nausea, heartburn or change in bowel habits Skin: Denies abnormal skin rashes Lymphatics: Denies new lymphadenopathy or easy bruising Neurological:Denies numbness, tingling or new  weaknesses Behavioral/Psych: Mood is stable, no new changes  All other systems were reviewed with the patient and are negative.  I have reviewed the past medical history, past surgical history, social history and family history with the patient and they are unchanged from previous note.  ALLERGIES:  is allergic to aspirin.  MEDICATIONS:  Current Outpatient Prescriptions  Medication Sig Dispense Refill  . fluticasone (FLONASE) 50 MCG/ACT nasal spray Place 2 sprays into both nostrils daily. 16 g 2  . guaiFENesin (ROBITUSSIN) 100 MG/5ML SOLN Take 5 mLs (100 mg total) by mouth every 4 (four) hours as needed for cough or to loosen phlegm. 473 mL 3  . lactulose (CHRONULAC) 10 GM/15ML solution Take 15 mLs (10 g total) by mouth 3 (three) times daily. (Patient not taking: Reported on 02/13/2017) 473 mL 2  . levothyroxine (SYNTHROID) 100 MCG tablet Take 1 tablet (100 mcg total) by mouth daily  before breakfast. 30 tablet 9  . lidocaine (XYLOCAINE) 2 % solution Use as directed 15 mLs in the mouth or throat every 8 (eight) hours as needed for mouth pain. (Patient not taking: Reported on 01/09/2017) 100 mL 0  . loperamide (IMODIUM) 2 MG capsule Take 1 capsule (2 mg total) by mouth as needed for diarrhea or loose stools. 30 capsule 1  . LORazepam (ATIVAN) 1 MG tablet Take 1 tablet (1 mg total) by mouth 2 (two) times daily as needed for anxiety (or nausea). (Patient not taking: Reported on 02/13/2017) 10 tablet 0  . morphine (MS CONTIN) 30 MG 12 hr tablet Take 1 tablet (30 mg total) by mouth every 12 (twelve) hours. 60 tablet 0  . morphine (MS CONTIN) 60 MG 12 hr tablet Take 1 tablet (60 mg total) by mouth every 12 (twelve) hours. 60 tablet 0  . morphine (MSIR) 30 MG tablet Take 2 tablets (60 mg total) by mouth every 4 (four) hours as needed for severe pain. 90 tablet 0  . Nutritional Supplements (FEEDING SUPPLEMENT, JEVITY 1.5 CAL/FIBER,) LIQD Give 1 can Osmolite 1.2 + 1 can of Jevity 1.5 QID via PEG with 60 cc  free water before and after bolus. Flush with 240 cc free water BID between feedings. 948 mL   . omeprazole (PRILOSEC) 20 MG capsule Take 1 capsule (20 mg total) by mouth 2 (two) times daily. 60 capsule 0  . ondansetron (ZOFRAN) 8 MG tablet Take 1 tablet (8 mg total) by mouth every 8 (eight) hours as needed for nausea. (Patient not taking: Reported on 02/13/2017) 60 tablet 3  . polyethylene glycol (MIRALAX) packet Take 17 g by mouth daily. (Patient not taking: Reported on 01/09/2017) 14 each 0  . SODIUM CHLORIDE, EXTERNAL, 0.9 % SOLN Use to clean around Trach and perform Trach care once daily and PRN 1000 mL 11  . triamcinolone (NASACORT AQ) 55 MCG/ACT AERO nasal inhaler Place 2 sprays into the nose daily. 1 Inhaler 12   Current Facility-Administered Medications  Medication Dose Route Frequency Provider Last Rate Last Dose  . guaiFENesin-dextromethorphan (ROBITUSSIN DM) 100-10 MG/5ML syrup 10 mL  10 mL Oral Q4H PRN [name], MD   10 mL at 01/09/17 1427   Facility-Administered Medications Ordered in Other Visits  Medication Dose Route Frequency Provider Last Rate Last Dose  . 0.9 %  sodium chloride infusion   Intravenous Once [name], MD      . famotidine (PEPCID) IVPB 20 mg premix  20 mg Intravenous Once [name], MD   20 mg at 03/13/17 1022  . fosaprepitant (EMEND) 150 mg, dexamethasone (DECADRON) 12 mg in sodium chloride 0.9 % 145 mL IVPB   Intravenous Once [name], MD      . heparin lock flush 100 unit/mL  500 Units Intracatheter Once PRN [name], MD      . HYDROmorphone (DILAUDID) injection 2 mg  2 mg Intravenous Q2H PRN [name], MD   2 mg at 12/26/16 1440  . PACLitaxel (TAXOL) 156 mg in dextrose 5 % 250 mL chemo infusion (> 80mg /m2)  80 mg/m2 (Treatment Plan Recorded) Intravenous Once [name], MD      . sodium chloride 0.9 % injection 10 mL  10 mL Intracatheter PRN [name], MD   10 mL at 01/02/17 1107  . sodium chloride flush (NS) 0.9 % injection 10 mL  10 mL Intracatheter  PRN [name], MD   10 mL at 12/26/16 1708  . sodium chloride  flush (NS) 0.9 % injection 10 mL  10 mL Intracatheter PRN [name], MD        PHYSICAL EXAMINATION: ECOG PERFORMANCE STATUS: 1 - Symptomatic but completely ambulatory GENERAL:alert, no distress and comfortable SKIN: skin color, texture, turgor are normal, no rashes or significant lesions EYES: normal, Conjunctiva are pink and non-injected, sclera clear OROPHARYNX:no exudate, no erythema and lips, buccal mucosa, and tongue normal  NECK: Tracheostomy site looks clean without signs of infection or bleeding  LYMPH:  no palpable lymphadenopathy in the cervical, axillary or inguinal LUNGS: clear to auscultation and percussion with normal breathing effort HEART: regular rate & rhythm and no murmurs and no lower extremity edema ABDOMEN:abdomen soft, non-tender and normal bowel sounds Musculoskeletal:no cyanosis of digits and no clubbing  NEURO: alert & oriented x 3 with fluent speech, no focal motor/sensory deficits  LABORATORY DATA:  I have reviewed the data as listed    Component Value Date/Time   NA 141 03/13/2017 0916   K 4.1 03/13/2017 0916   CL 99 (L) 12/27/2016 1748   CO2 27 03/13/2017 0916   GLUCOSE 104 03/13/2017 0916   BUN 11.5 03/13/2017 0916   CREATININE 0.7 03/13/2017 0916   CALCIUM 9.8 03/13/2017 0916   PROT 6.8 03/13/2017 0916   ALBUMIN 4.0 03/13/2017 0916   AST 13 03/13/2017 0916   ALT 13 03/13/2017 0916   ALKPHOS 58 03/13/2017 0916   BILITOT 0.29 03/13/2017 0916   GFRNONAA >60 12/27/2016 1748   GFRAA >60 12/27/2016 1748    No results found for: SPEP, UPEP  Lab Results  Component Value Date   WBC 7.4 03/13/2017   NEUTROABS 6.0 03/13/2017   HGB 12.3 (L) 03/13/2017   HCT 36.3 (L) 03/13/2017   MCV 100.9 (H) 03/13/2017   PLT 200 03/13/2017      Chemistry      Component Value Date/Time   NA 141 03/13/2017 0916   K 4.1 03/13/2017 0916   CL 99 (L) 12/27/2016 1748   CO2 27 03/13/2017 0916   BUN  11.5 03/13/2017 0916   CREATININE 0.7 03/13/2017 0916      Component Value Date/Time   CALCIUM 9.8 03/13/2017 0916   ALKPHOS 58 03/13/2017 0916   AST 13 03/13/2017 0916   ALT 13 03/13/2017 0916   BILITOT 0.29 03/13/2017 0916      ASSESSMENT & PLAN:  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[facility]) He tolerated treatment well except for nausea We will proceed with treatment without dose adjustment, on days 1 and 8 and rest day 15, for cycle of every 21 days I will see him on day 1 of every cycle He will continue treatment without dose adjustment His next imaging study would be due in June  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 The patient denies recent history of bleeding such as epistaxis, hematuria or hematochezia. He is asymptomatic from the anemia. I will observe for now.  He does not require transfusion now. I will continue the chemotherapy at current dose without dosage adjustment.  If the anemia gets progressive worse in the future, I might have to delay his treatment or adjust the chemotherapy dose.   Tracheostomy care [facility]) He denies recent bleeding from the tracheostomy site. I recommend he continue close follow-up to see his ENT doctor for tracheostomy site care   No orders of the defined types were placed in this encounter.  All questions were answered. The patient knows to call the clinic with any problems, questions or concerns. No barriers to learning was detected. I spent 15 minutes counseling the patient face to face. The total time spent in the appointment was 20 minutes and more than 50% was on counseling and review of test results     [name], MD 03/13/2017 10:31 AM

## 2017-03-14 ENCOUNTER — Ambulatory Visit (HOSPITAL_BASED_OUTPATIENT_CLINIC_OR_DEPARTMENT_OTHER): Payer: Medicaid Other | Admitting: Nurse Practitioner

## 2017-03-14 VITALS — BP 125/87 | HR 72 | Temp 98.8°F | Resp 16 | Wt 170.3 lb

## 2017-03-14 DIAGNOSIS — C770 Secondary and unspecified malignant neoplasm of lymph nodes of head, face and neck: Secondary | ICD-10-CM

## 2017-03-14 DIAGNOSIS — C321 Malignant neoplasm of supraglottis: Secondary | ICD-10-CM

## 2017-03-14 MED ORDER — HEPARIN SOD (PORK) LOCK FLUSH 100 UNIT/ML IV SOLN
500.0000 [IU] | Freq: Once | INTRAVENOUS | Status: DC | PRN
  Filled 2017-03-14: qty 5

## 2017-03-14 MED ORDER — ALUM & MAG HYDROXIDE-SIMETH 200-200-20 MG/5ML PO SUSP
30.0000 mL | Freq: Once | ORAL | Status: AC
  Administered 2017-03-14: 30 mL via ORAL
  Filled 2017-03-14: qty 30

## 2017-03-14 MED ORDER — ONDANSETRON HCL 4 MG/2ML IJ SOLN
INTRAMUSCULAR | Status: AC
  Filled 2017-03-14: qty 4

## 2017-03-14 MED ORDER — SODIUM CHLORIDE 0.9 % IV SOLN
Freq: Once | INTRAVENOUS | Status: AC
  Administered 2017-03-14: 14:00:00 via INTRAVENOUS

## 2017-03-14 MED ORDER — SODIUM CHLORIDE 0.9 % IJ SOLN
10.0000 mL | INTRAMUSCULAR | Status: DC | PRN
  Filled 2017-03-14: qty 10

## 2017-03-14 MED ORDER — ONDANSETRON HCL 4 MG/2ML IJ SOLN
8.0000 mg | Freq: Once | INTRAMUSCULAR | Status: AC
  Administered 2017-03-14: 8 mg via INTRAVENOUS

## 2017-03-14 MED ORDER — SODIUM CHLORIDE 0.9 % IJ SOLN
10.0000 mL | Freq: Once | INTRAMUSCULAR | Status: AC
  Administered 2017-03-14: 10 mL
  Filled 2017-03-14: qty 10

## 2017-03-14 MED ORDER — HEPARIN SOD (PORK) LOCK FLUSH 100 UNIT/ML IV SOLN
500.0000 [IU] | Freq: Once | INTRAVENOUS | Status: AC
  Administered 2017-03-14: 500 [IU]
  Filled 2017-03-14: qty 5

## 2017-03-14 NOTE — Patient Instructions (Signed)
Dehydration, Adult Dehydration is a condition in which there is not enough fluid or water in the body. This happens when you lose more fluids than you take in. Important organs, such as the kidneys, brain, and heart, cannot function without a proper amount of fluids. Any loss of fluids from the body can lead to dehydration. Dehydration can range from mild to severe. This condition should be treated right away to prevent it from becoming severe. What are the causes? This condition may be caused by:  Vomiting.  Diarrhea.  Excessive sweating, such as from heat exposure or exercise.  Not drinking enough fluid, especially:  When ill.  While doing activity that requires a lot of energy.  Excessive urination.  Fever.  Infection.  Certain medicines, such as medicines that cause the body to lose excess fluid (diuretics).  Inability to access safe drinking water.  Reduced physical ability to get adequate water and food. What increases the risk? This condition is more likely to develop in people:  Who have a poorly controlled long-term (chronic) illness, such as diabetes, heart disease, or kidney disease.  Who are age 65 or older.  Who are disabled.  Who live in a place with high altitude.  Who play endurance sports. What are the signs or symptoms? Symptoms of mild dehydration may include:   Thirst.  Dry lips.  Slightly dry mouth.  Dry, warm skin.  Dizziness. Symptoms of moderate dehydration may include:   Very dry mouth.  Muscle cramps.  Dark urine. Urine may be the color of tea.  Decreased urine production.  Decreased tear production.  Heartbeat that is irregular or faster than normal (palpitations).  Headache.  Light-headedness, especially when you stand up from a sitting position.  Fainting (syncope). Symptoms of severe dehydration may include:   Changes in skin, such as:  Cold and clammy skin.  Blotchy (mottled) or pale skin.  Skin that does  not quickly return to normal after being lightly pinched and released (poor skin turgor).  Changes in body fluids, such as:  Extreme thirst.  No tear production.  Inability to sweat when body temperature is high, such as in hot weather.  Very little urine production.  Changes in vital signs, such as:  Weak pulse.  Pulse that is more than 100 beats a minute when sitting still.  Rapid breathing.  Low blood pressure.  Other changes, such as:  Sunken eyes.  Cold hands and feet.  Confusion.  Lack of energy (lethargy).  Difficulty waking up from sleep.  Short-term weight loss.  Unconsciousness. How is this diagnosed? This condition is diagnosed based on your symptoms and a physical exam. Blood and urine tests may be done to help confirm the diagnosis. How is this treated? Treatment for this condition depends on the severity. Mild or moderate dehydration can often be treated at home. Treatment should be started right away. Do not wait until dehydration becomes severe. Severe dehydration is an emergency and it needs to be treated in a hospital. Treatment for mild dehydration may include:   Drinking more fluids.  Replacing salts and minerals in your blood (electrolytes) that you may have lost. Treatment for moderate dehydration may include:   Drinking an oral rehydration solution (ORS). This is a drink that helps you replace fluids and electrolytes (rehydrate). It can be found at pharmacies and retail stores. Treatment for severe dehydration may include:   Receiving fluids through an IV tube.  Receiving an electrolyte solution through a feeding tube that is   passed through your nose and into your stomach (nasogastric tube, or NG tube).  Correcting any abnormalities in electrolytes.  Treating the underlying cause of dehydration. Follow these instructions at home:  If directed by your health care provider, drink an ORS:  Make an ORS by following instructions on the  package.  Start by drinking small amounts, about  cup (120 mL) every 5-10 minutes.  Slowly increase how much you drink until you have taken the amount recommended by your health care provider.  Drink enough clear fluid to keep your urine clear or pale yellow. If you were told to drink an ORS, finish the ORS first, then start slowly drinking other clear fluids. Drink fluids such as:  Water. Do not drink only water. Doing that can lead to having too little salt (sodium) in the body (hyponatremia).  Ice chips.  Fruit juice that you have added water to (diluted fruit juice).  Low-calorie sports drinks.  Avoid:  Alcohol.  Drinks that contain a lot of sugar. These include high-calorie sports drinks, fruit juice that is not diluted, and soda.  Caffeine.  Foods that are greasy or contain a lot of fat or sugar.  Take over-the-counter and prescription medicines only as told by your health care provider.  Do not take sodium tablets. This can lead to having too much sodium in the body (hypernatremia).  Eat foods that contain a healthy balance of electrolytes, such as bananas, oranges, potatoes, tomatoes, and spinach.  Keep all follow-up visits as told by your health care provider. This is important. Contact a health care provider if:  You have abdominal pain that:  Gets worse.  Stays in one area (localizes).  You have a rash.  You have a stiff neck.  You are more irritable than usual.  You are sleepier or more difficult to wake up than usual.  You feel weak or dizzy.  You feel very thirsty.  You have urinated only a small amount of very dark urine over 6-8 hours. Get help right away if:  You have symptoms of severe dehydration.  You cannot drink fluids without vomiting.  Your symptoms get worse with treatment.  You have a fever.  You have a severe headache.  You have vomiting or diarrhea that:  Gets worse.  Does not go away.  You have blood or green matter  (bile) in your vomit.  You have blood in your stool. This may cause stool to look black and tarry.  You have not urinated in 6-8 hours.  You faint.  Your heart rate while sitting still is over 100 beats a minute.  You have trouble breathing. This information is not intended to replace advice given to you by your health care provider. Make sure you discuss any questions you have with your health care provider. Document Released: 11/28/2005 Document Revised: 06/24/2016 Document Reviewed: 01/22/2016 Elsevier Interactive Patient Education  2017 [employer]  

## 2017-03-14 NOTE — Progress Notes (Signed)
RN visit for IV fluids. 

## 2017-03-15 ENCOUNTER — Ambulatory Visit (HOSPITAL_BASED_OUTPATIENT_CLINIC_OR_DEPARTMENT_OTHER): Payer: Medicaid Other | Admitting: Nurse Practitioner

## 2017-03-15 VITALS — BP 122/79 | HR 89 | Temp 98.9°F | Resp 19 | Ht 70.0 in | Wt 171.8 lb

## 2017-03-15 DIAGNOSIS — R112 Nausea with vomiting, unspecified: Secondary | ICD-10-CM

## 2017-03-15 DIAGNOSIS — C321 Malignant neoplasm of supraglottis: Secondary | ICD-10-CM

## 2017-03-15 MED ORDER — ALUM & MAG HYDROXIDE-SIMETH 200-200-20 MG/5ML PO SUSP
30.0000 mL | Freq: Once | ORAL | Status: AC
  Administered 2017-03-15: 30 mL via ORAL
  Filled 2017-03-15: qty 30

## 2017-03-15 MED ORDER — SODIUM CHLORIDE 0.9 % IV SOLN
Freq: Once | INTRAVENOUS | Status: AC
  Administered 2017-03-15: 14:00:00 via INTRAVENOUS

## 2017-03-15 MED ORDER — HEPARIN SOD (PORK) LOCK FLUSH 100 UNIT/ML IV SOLN
500.0000 [IU] | Freq: Once | INTRAVENOUS | Status: AC | PRN
  Administered 2017-03-15: 500 [IU]
  Filled 2017-03-15: qty 5

## 2017-03-15 MED ORDER — SODIUM CHLORIDE 0.9 % IJ SOLN
10.0000 mL | INTRAMUSCULAR | Status: DC | PRN
  Administered 2017-03-15: 10 mL
  Filled 2017-03-15: qty 10

## 2017-03-15 NOTE — Progress Notes (Signed)
RN visit for IV fluids.  Call made to Dr. Noreene Filbert office for f/u appt for Kyle Alexander.  Appt set for tomorrow, 03/16/17 @ 3:40 pm.  Pt aware and will be there.

## 2017-03-15 NOTE — Patient Instructions (Signed)
Dehydration, Adult Dehydration is a condition in which there is not enough fluid or water in the body. This happens when you lose more fluids than you take in. Important organs, such as the kidneys, brain, and heart, cannot function without a proper amount of fluids. Any loss of fluids from the body can lead to dehydration. Dehydration can range from mild to severe. This condition should be treated right away to prevent it from becoming severe. What are the causes? This condition may be caused by:  Vomiting.  Diarrhea.  Excessive sweating, such as from heat exposure or exercise.  Not drinking enough fluid, especially:  When ill.  While doing activity that requires a lot of energy.  Excessive urination.  Fever.  Infection.  Certain medicines, such as medicines that cause the body to lose excess fluid (diuretics).  Inability to access safe drinking water.  Reduced physical ability to get adequate water and food. What increases the risk? This condition is more likely to develop in people:  Who have a poorly controlled long-term (chronic) illness, such as diabetes, heart disease, or kidney disease.  Who are age 65 or older.  Who are disabled.  Who live in a place with high altitude.  Who play endurance sports. What are the signs or symptoms? Symptoms of mild dehydration may include:   Thirst.  Dry lips.  Slightly dry mouth.  Dry, warm skin.  Dizziness. Symptoms of moderate dehydration may include:   Very dry mouth.  Muscle cramps.  Dark urine. Urine may be the color of tea.  Decreased urine production.  Decreased tear production.  Heartbeat that is irregular or faster than normal (palpitations).  Headache.  Light-headedness, especially when you stand up from a sitting position.  Fainting (syncope). Symptoms of severe dehydration may include:   Changes in skin, such as:  Cold and clammy skin.  Blotchy (mottled) or pale skin.  Skin that does  not quickly return to normal after being lightly pinched and released (poor skin turgor).  Changes in body fluids, such as:  Extreme thirst.  No tear production.  Inability to sweat when body temperature is high, such as in hot weather.  Very little urine production.  Changes in vital signs, such as:  Weak pulse.  Pulse that is more than 100 beats a minute when sitting still.  Rapid breathing.  Low blood pressure.  Other changes, such as:  Sunken eyes.  Cold hands and feet.  Confusion.  Lack of energy (lethargy).  Difficulty waking up from sleep.  Short-term weight loss.  Unconsciousness. How is this diagnosed? This condition is diagnosed based on your symptoms and a physical exam. Blood and urine tests may be done to help confirm the diagnosis. How is this treated? Treatment for this condition depends on the severity. Mild or moderate dehydration can often be treated at home. Treatment should be started right away. Do not wait until dehydration becomes severe. Severe dehydration is an emergency and it needs to be treated in a hospital. Treatment for mild dehydration may include:   Drinking more fluids.  Replacing salts and minerals in your blood (electrolytes) that you may have lost. Treatment for moderate dehydration may include:   Drinking an oral rehydration solution (ORS). This is a drink that helps you replace fluids and electrolytes (rehydrate). It can be found at pharmacies and retail stores. Treatment for severe dehydration may include:   Receiving fluids through an IV tube.  Receiving an electrolyte solution through a feeding tube that is   passed through your nose and into your stomach (nasogastric tube, or NG tube).  Correcting any abnormalities in electrolytes.  Treating the underlying cause of dehydration. Follow these instructions at home:  If directed by your health care provider, drink an ORS:  Make an ORS by following instructions on the  package.  Start by drinking small amounts, about  cup (120 mL) every 5-10 minutes.  Slowly increase how much you drink until you have taken the amount recommended by your health care provider.  Drink enough clear fluid to keep your urine clear or pale yellow. If you were told to drink an ORS, finish the ORS first, then start slowly drinking other clear fluids. Drink fluids such as:  Water. Do not drink only water. Doing that can lead to having too little salt (sodium) in the body (hyponatremia).  Ice chips.  Fruit juice that you have added water to (diluted fruit juice).  Low-calorie sports drinks.  Avoid:  Alcohol.  Drinks that contain a lot of sugar. These include high-calorie sports drinks, fruit juice that is not diluted, and soda.  Caffeine.  Foods that are greasy or contain a lot of fat or sugar.  Take over-the-counter and prescription medicines only as told by your health care provider.  Do not take sodium tablets. This can lead to having too much sodium in the body (hypernatremia).  Eat foods that contain a healthy balance of electrolytes, such as bananas, oranges, potatoes, tomatoes, and spinach.  Keep all follow-up visits as told by your health care provider. This is important. Contact a health care provider if:  You have abdominal pain that:  Gets worse.  Stays in one area (localizes).  You have a rash.  You have a stiff neck.  You are more irritable than usual.  You are sleepier or more difficult to wake up than usual.  You feel weak or dizzy.  You feel very thirsty.  You have urinated only a small amount of very dark urine over 6-8 hours. Get help right away if:  You have symptoms of severe dehydration.  You cannot drink fluids without vomiting.  Your symptoms get worse with treatment.  You have a fever.  You have a severe headache.  You have vomiting or diarrhea that:  Gets worse.  Does not go away.  You have blood or green matter  (bile) in your vomit.  You have blood in your stool. This may cause stool to look black and tarry.  You have not urinated in 6-8 hours.  You faint.  Your heart rate while sitting still is over 100 beats a minute.  You have trouble breathing. This information is not intended to replace advice given to you by your health care provider. Make sure you discuss any questions you have with your health care provider. Document Released: 11/28/2005 Document Revised: 06/24/2016 Document Reviewed: 01/22/2016 Elsevier Interactive Patient Education  2017 [employer]  

## 2017-03-18 ENCOUNTER — Telehealth: Payer: Self-pay | Admitting: Hematology and Oncology

## 2017-03-18 NOTE — Telephone Encounter (Signed)
Appointments scheduled per in-basket message.   Patient will pick up next scheduled appointments on next visit, 4.9.18.

## 2017-03-20 ENCOUNTER — Ambulatory Visit (HOSPITAL_BASED_OUTPATIENT_CLINIC_OR_DEPARTMENT_OTHER): Payer: Medicaid Other

## 2017-03-20 ENCOUNTER — Other Ambulatory Visit (HOSPITAL_BASED_OUTPATIENT_CLINIC_OR_DEPARTMENT_OTHER): Payer: Medicaid Other

## 2017-03-20 ENCOUNTER — Other Ambulatory Visit: Payer: Self-pay | Admitting: Hematology and Oncology

## 2017-03-20 VITALS — BP 140/79 | HR 79 | Temp 99.1°F | Resp 19

## 2017-03-20 DIAGNOSIS — C321 Malignant neoplasm of supraglottis: Secondary | ICD-10-CM

## 2017-03-20 DIAGNOSIS — C771 Secondary and unspecified malignant neoplasm of intrathoracic lymph nodes: Secondary | ICD-10-CM

## 2017-03-20 DIAGNOSIS — Z5111 Encounter for antineoplastic chemotherapy: Secondary | ICD-10-CM

## 2017-03-20 DIAGNOSIS — Z79899 Other long term (current) drug therapy: Secondary | ICD-10-CM | POA: Diagnosis not present

## 2017-03-20 DIAGNOSIS — R5383 Other fatigue: Secondary | ICD-10-CM

## 2017-03-20 LAB — CBC WITH DIFFERENTIAL/PLATELET
BASO%: 0.3 % (ref 0.0–2.0)
Basophils Absolute: 0 10*3/uL (ref 0.0–0.1)
EOS%: 1.4 % (ref 0.0–7.0)
Eosinophils Absolute: 0.1 10*3/uL (ref 0.0–0.5)
HCT: 37 % — ABNORMAL LOW (ref 38.4–49.9)
HGB: 12.2 g/dL — ABNORMAL LOW (ref 13.0–17.1)
LYMPH%: 5.9 % — ABNORMAL LOW (ref 14.0–49.0)
MCH: 33.6 pg — ABNORMAL HIGH (ref 27.2–33.4)
MCHC: 33 g/dL (ref 32.0–36.0)
MCV: 101.9 fL — ABNORMAL HIGH (ref 79.3–98.0)
MONO#: 0.6 10*3/uL (ref 0.1–0.9)
MONO%: 6.9 % (ref 0.0–14.0)
NEUT#: 7.8 10*3/uL — ABNORMAL HIGH (ref 1.5–6.5)
NEUT%: 85.5 % — ABNORMAL HIGH (ref 39.0–75.0)
Platelets: 184 10*3/uL (ref 140–400)
RBC: 3.63 10*6/uL — ABNORMAL LOW (ref 4.20–5.82)
RDW: 15.2 % — ABNORMAL HIGH (ref 11.0–14.6)
WBC: 9.1 10*3/uL (ref 4.0–10.3)
lymph#: 0.5 10*3/uL — ABNORMAL LOW (ref 0.9–3.3)

## 2017-03-20 LAB — TSH: TSH: 1.684 m[IU]/L (ref 0.320–4.118)

## 2017-03-20 LAB — COMPREHENSIVE METABOLIC PANEL
ALBUMIN: 4 g/dL (ref 3.5–5.0)
ALK PHOS: 57 U/L (ref 40–150)
ALT: 16 U/L (ref 0–55)
AST: 13 U/L (ref 5–34)
Anion Gap: 10 mEq/L (ref 3–11)
BILIRUBIN TOTAL: 0.24 mg/dL (ref 0.20–1.20)
BUN: 14.9 mg/dL (ref 7.0–26.0)
CALCIUM: 10 mg/dL (ref 8.4–10.4)
CHLORIDE: 101 meq/L (ref 98–109)
CO2: 29 mEq/L (ref 22–29)
CREATININE: 0.8 mg/dL (ref 0.7–1.3)
EGFR: >90 mL/min/{1.73_m2} (ref >90)
Glucose: 111 mg/dl (ref 70–140)
Potassium: 4.5 mEq/L (ref 3.5–5.1)
Sodium: 140 mEq/L (ref 136–145)
Total Protein: 7 g/dL (ref 6.4–8.3)

## 2017-03-20 LAB — TECHNOLOGIST REVIEW

## 2017-03-20 MED ORDER — FAMOTIDINE IN NACL 20-0.9 MG/50ML-% IV SOLN
INTRAVENOUS | Status: AC
  Filled 2017-03-20: qty 50

## 2017-03-20 MED ORDER — HYDROMORPHONE HCL 4 MG/ML IJ SOLN
INTRAMUSCULAR | Status: AC
  Filled 2017-03-20: qty 1

## 2017-03-20 MED ORDER — HYDROMORPHONE HCL 4 MG/ML IJ SOLN
2.0000 mg | Freq: Once | INTRAMUSCULAR | Status: AC
  Administered 2017-03-20: 2 mg via INTRAVENOUS

## 2017-03-20 MED ORDER — PALONOSETRON HCL INJECTION 0.25 MG/5ML
INTRAVENOUS | Status: AC
  Filled 2017-03-20: qty 5

## 2017-03-20 MED ORDER — DIPHENHYDRAMINE HCL 50 MG/ML IJ SOLN
50.0000 mg | Freq: Once | INTRAMUSCULAR | Status: AC
  Administered 2017-03-20: 50 mg via INTRAVENOUS

## 2017-03-20 MED ORDER — HEPARIN SOD (PORK) LOCK FLUSH 100 UNIT/ML IV SOLN
500.0000 [IU] | Freq: Once | INTRAVENOUS | Status: AC | PRN
  Administered 2017-03-20: 500 [IU]
  Filled 2017-03-20: qty 5

## 2017-03-20 MED ORDER — FAMOTIDINE IN NACL 20-0.9 MG/50ML-% IV SOLN
20.0000 mg | Freq: Once | INTRAVENOUS | Status: AC
  Administered 2017-03-20: 20 mg via INTRAVENOUS

## 2017-03-20 MED ORDER — SODIUM CHLORIDE 0.9 % IV SOLN
Freq: Once | INTRAVENOUS | Status: AC
  Administered 2017-03-20: 12:00:00 via INTRAVENOUS
  Filled 2017-03-20: qty 5

## 2017-03-20 MED ORDER — SODIUM CHLORIDE 0.9 % IV SOLN
Freq: Once | INTRAVENOUS | Status: AC
  Administered 2017-03-20: 10:00:00 via INTRAVENOUS

## 2017-03-20 MED ORDER — SODIUM CHLORIDE 0.9% FLUSH
10.0000 mL | INTRAVENOUS | Status: DC | PRN
  Administered 2017-03-20: 10 mL
  Filled 2017-03-20: qty 10

## 2017-03-20 MED ORDER — PALONOSETRON HCL INJECTION 0.25 MG/5ML
0.2500 mg | Freq: Once | INTRAVENOUS | Status: AC
  Administered 2017-03-20: 0.25 mg via INTRAVENOUS

## 2017-03-20 MED ORDER — DEXTROSE 5 % IV SOLN
80.0000 mg/m2 | Freq: Once | INTRAVENOUS | Status: AC
  Administered 2017-03-20: 156 mg via INTRAVENOUS
  Filled 2017-03-20: qty 26

## 2017-03-20 MED ORDER — DIPHENHYDRAMINE HCL 50 MG/ML IJ SOLN
INTRAMUSCULAR | Status: AC
  Filled 2017-03-20: qty 1

## 2017-03-20 NOTE — Patient Instructions (Signed)
[facility] Discharge Instructions for Patients Receiving Chemotherapy  Today you received the following chemotherapy agents Taxol   To help prevent nausea and vomiting after your treatment, we encourage you to take your nausea medication as directed.   If you develop nausea and vomiting that is not controlled by your nausea medication, call the clinic.   BELOW ARE SYMPTOMS THAT SHOULD BE REPORTED IMMEDIATELY:  *FEVER GREATER THAN 100.5 F  *CHILLS WITH OR WITHOUT FEVER  NAUSEA AND VOMITING THAT IS NOT CONTROLLED WITH YOUR NAUSEA MEDICATION  *UNUSUAL SHORTNESS OF BREATH  *UNUSUAL BRUISING OR BLEEDING  TENDERNESS IN MOUTH AND THROAT WITH OR WITHOUT PRESENCE OF ULCERS  *URINARY PROBLEMS  *BOWEL PROBLEMS  UNUSUAL RASH Items with * indicate a potential emergency and should be followed up as soon as possible.  Feel free to call the clinic you have any questions or concerns. The clinic phone number is [phone].  Please show the CHEMO ALERT CARD at check-in to the Emergency Department and triage nurse.   

## 2017-03-21 ENCOUNTER — Ambulatory Visit (HOSPITAL_BASED_OUTPATIENT_CLINIC_OR_DEPARTMENT_OTHER): Payer: Medicaid Other | Admitting: Nurse Practitioner

## 2017-03-21 ENCOUNTER — Encounter: Payer: Self-pay | Admitting: Nutrition

## 2017-03-21 VITALS — BP 123/65 | HR 103 | Temp 98.9°F | Resp 18 | Ht 70.0 in | Wt 167.6 lb

## 2017-03-21 DIAGNOSIS — C321 Malignant neoplasm of supraglottis: Secondary | ICD-10-CM

## 2017-03-21 DIAGNOSIS — C770 Secondary and unspecified malignant neoplasm of lymph nodes of head, face and neck: Secondary | ICD-10-CM | POA: Diagnosis not present

## 2017-03-21 MED ORDER — HEPARIN SOD (PORK) LOCK FLUSH 100 UNIT/ML IV SOLN
500.0000 [IU] | Freq: Once | INTRAVENOUS | Status: AC | PRN
  Administered 2017-03-21: 500 [IU]
  Filled 2017-03-21: qty 5

## 2017-03-21 MED ORDER — SODIUM CHLORIDE 0.9 % IJ SOLN
10.0000 mL | INTRAMUSCULAR | Status: DC | PRN
  Administered 2017-03-21: 10 mL
  Filled 2017-03-21: qty 10

## 2017-03-21 MED ORDER — ALUM & MAG HYDROXIDE-SIMETH 200-200-20 MG/5ML PO SUSP
30.0000 mL | Freq: Once | ORAL | Status: AC
  Administered 2017-03-21: 30 mL via ORAL
  Filled 2017-03-21: qty 30

## 2017-03-21 MED ORDER — SODIUM CHLORIDE 0.9 % IV SOLN
Freq: Once | INTRAVENOUS | Status: AC
Start: 2017-03-21 — End: 2017-03-21
  Administered 2017-03-21: 13:00:00 via INTRAVENOUS

## 2017-03-21 NOTE — Progress Notes (Signed)
RN visit for IV fluids. 

## 2017-03-21 NOTE — Patient Instructions (Signed)
Dehydration, Adult Dehydration is a condition in which there is not enough fluid or water in the body. This happens when you lose more fluids than you take in. Important organs, such as the kidneys, brain, and heart, cannot function without a proper amount of fluids. Any loss of fluids from the body can lead to dehydration. Dehydration can range from mild to severe. This condition should be treated right away to prevent it from becoming severe. What are the causes? This condition may be caused by:  Vomiting.  Diarrhea.  Excessive sweating, such as from heat exposure or exercise.  Not drinking enough fluid, especially:  When ill.  While doing activity that requires a lot of energy.  Excessive urination.  Fever.  Infection.  Certain medicines, such as medicines that cause the body to lose excess fluid (diuretics).  Inability to access safe drinking water.  Reduced physical ability to get adequate water and food. What increases the risk? This condition is more likely to develop in people:  Who have a poorly controlled long-term (chronic) illness, such as diabetes, heart disease, or kidney disease.  Who are age 65 or older.  Who are disabled.  Who live in a place with high altitude.  Who play endurance sports. What are the signs or symptoms? Symptoms of mild dehydration may include:   Thirst.  Dry lips.  Slightly dry mouth.  Dry, warm skin.  Dizziness. Symptoms of moderate dehydration may include:   Very dry mouth.  Muscle cramps.  Dark urine. Urine may be the color of tea.  Decreased urine production.  Decreased tear production.  Heartbeat that is irregular or faster than normal (palpitations).  Headache.  Light-headedness, especially when you stand up from a sitting position.  Fainting (syncope). Symptoms of severe dehydration may include:   Changes in skin, such as:  Cold and clammy skin.  Blotchy (mottled) or pale skin.  Skin that does  not quickly return to normal after being lightly pinched and released (poor skin turgor).  Changes in body fluids, such as:  Extreme thirst.  No tear production.  Inability to sweat when body temperature is high, such as in hot weather.  Very little urine production.  Changes in vital signs, such as:  Weak pulse.  Pulse that is more than 100 beats a minute when sitting still.  Rapid breathing.  Low blood pressure.  Other changes, such as:  Sunken eyes.  Cold hands and feet.  Confusion.  Lack of energy (lethargy).  Difficulty waking up from sleep.  Short-term weight loss.  Unconsciousness. How is this diagnosed? This condition is diagnosed based on your symptoms and a physical exam. Blood and urine tests may be done to help confirm the diagnosis. How is this treated? Treatment for this condition depends on the severity. Mild or moderate dehydration can often be treated at home. Treatment should be started right away. Do not wait until dehydration becomes severe. Severe dehydration is an emergency and it needs to be treated in a hospital. Treatment for mild dehydration may include:   Drinking more fluids.  Replacing salts and minerals in your blood (electrolytes) that you may have lost. Treatment for moderate dehydration may include:   Drinking an oral rehydration solution (ORS). This is a drink that helps you replace fluids and electrolytes (rehydrate). It can be found at pharmacies and retail stores. Treatment for severe dehydration may include:   Receiving fluids through an IV tube.  Receiving an electrolyte solution through a feeding tube that is   passed through your nose and into your stomach (nasogastric tube, or NG tube).  Correcting any abnormalities in electrolytes.  Treating the underlying cause of dehydration. Follow these instructions at home:  If directed by your health care provider, drink an ORS:  Make an ORS by following instructions on the  package.  Start by drinking small amounts, about  cup (120 mL) every 5-10 minutes.  Slowly increase how much you drink until you have taken the amount recommended by your health care provider.  Drink enough clear fluid to keep your urine clear or pale yellow. If you were told to drink an ORS, finish the ORS first, then start slowly drinking other clear fluids. Drink fluids such as:  Water. Do not drink only water. Doing that can lead to having too little salt (sodium) in the body (hyponatremia).  Ice chips.  Fruit juice that you have added water to (diluted fruit juice).  Low-calorie sports drinks.  Avoid:  Alcohol.  Drinks that contain a lot of sugar. These include high-calorie sports drinks, fruit juice that is not diluted, and soda.  Caffeine.  Foods that are greasy or contain a lot of fat or sugar.  Take over-the-counter and prescription medicines only as told by your health care provider.  Do not take sodium tablets. This can lead to having too much sodium in the body (hypernatremia).  Eat foods that contain a healthy balance of electrolytes, such as bananas, oranges, potatoes, tomatoes, and spinach.  Keep all follow-up visits as told by your health care provider. This is important. Contact a health care provider if:  You have abdominal pain that:  Gets worse.  Stays in one area (localizes).  You have a rash.  You have a stiff neck.  You are more irritable than usual.  You are sleepier or more difficult to wake up than usual.  You feel weak or dizzy.  You feel very thirsty.  You have urinated only a small amount of very dark urine over 6-8 hours. Get help right away if:  You have symptoms of severe dehydration.  You cannot drink fluids without vomiting.  Your symptoms get worse with treatment.  You have a fever.  You have a severe headache.  You have vomiting or diarrhea that:  Gets worse.  Does not go away.  You have blood or green matter  (bile) in your vomit.  You have blood in your stool. This may cause stool to look black and tarry.  You have not urinated in 6-8 hours.  You faint.  Your heart rate while sitting still is over 100 beats a minute.  You have trouble breathing. This information is not intended to replace advice given to you by your health care provider. Make sure you discuss any questions you have with your health care provider. Document Released: 11/28/2005 Document Revised: 06/24/2016 Document Reviewed: 01/22/2016 Elsevier Interactive Patient Education  2017 [employer]  

## 2017-03-22 ENCOUNTER — Ambulatory Visit: Payer: Self-pay | Admitting: Nurse Practitioner

## 2017-03-23 ENCOUNTER — Ambulatory Visit (HOSPITAL_BASED_OUTPATIENT_CLINIC_OR_DEPARTMENT_OTHER): Payer: Medicaid Other | Admitting: Nurse Practitioner

## 2017-03-23 VITALS — BP 138/59 | HR 88 | Temp 98.9°F | Resp 18 | Ht 70.0 in | Wt 170.1 lb

## 2017-03-23 DIAGNOSIS — C770 Secondary and unspecified malignant neoplasm of lymph nodes of head, face and neck: Secondary | ICD-10-CM | POA: Diagnosis not present

## 2017-03-23 DIAGNOSIS — C321 Malignant neoplasm of supraglottis: Secondary | ICD-10-CM | POA: Diagnosis present

## 2017-03-23 MED ORDER — SODIUM CHLORIDE 0.9 % IJ SOLN
10.0000 mL | INTRAMUSCULAR | Status: DC | PRN
  Administered 2017-03-23: 10 mL
  Filled 2017-03-23: qty 10

## 2017-03-23 MED ORDER — ONDANSETRON HCL 4 MG/2ML IJ SOLN: 8.0000 mg | Freq: Once | INTRAMUSCULAR | Status: DC

## 2017-03-23 MED ORDER — ALUM & MAG HYDROXIDE-SIMETH 200-200-20 MG/5ML PO SUSP
30.0000 mL | Freq: Once | ORAL | Status: AC
  Administered 2017-03-23: 30 mL via ORAL
  Filled 2017-03-23: qty 30

## 2017-03-23 MED ORDER — SODIUM CHLORIDE 0.9 % IV SOLN
Freq: Once | INTRAVENOUS | Status: AC
  Administered 2017-03-23: 12:00:00 via INTRAVENOUS

## 2017-03-23 MED ORDER — HEPARIN SOD (PORK) LOCK FLUSH 100 UNIT/ML IV SOLN
500.0000 [IU] | Freq: Once | INTRAVENOUS | Status: AC | PRN
  Administered 2017-03-23: 500 [IU]
  Filled 2017-03-23: qty 5

## 2017-03-23 NOTE — Patient Instructions (Signed)
Dehydration, Adult Dehydration is a condition in which there is not enough fluid or water in the body. This happens when you lose more fluids than you take in. Important organs, such as the kidneys, brain, and heart, cannot function without a proper amount of fluids. Any loss of fluids from the body can lead to dehydration. Dehydration can range from mild to severe. This condition should be treated right away to prevent it from becoming severe. What are the causes? This condition may be caused by:  Vomiting.  Diarrhea.  Excessive sweating, such as from heat exposure or exercise.  Not drinking enough fluid, especially:  When ill.  While doing activity that requires a lot of energy.  Excessive urination.  Fever.  Infection.  Certain medicines, such as medicines that cause the body to lose excess fluid (diuretics).  Inability to access safe drinking water.  Reduced physical ability to get adequate water and food. What increases the risk? This condition is more likely to develop in people:  Who have a poorly controlled long-term (chronic) illness, such as diabetes, heart disease, or kidney disease.  Who are age 65 or older.  Who are disabled.  Who live in a place with high altitude.  Who play endurance sports. What are the signs or symptoms? Symptoms of mild dehydration may include:   Thirst.  Dry lips.  Slightly dry mouth.  Dry, warm skin.  Dizziness. Symptoms of moderate dehydration may include:   Very dry mouth.  Muscle cramps.  Dark urine. Urine may be the color of tea.  Decreased urine production.  Decreased tear production.  Heartbeat that is irregular or faster than normal (palpitations).  Headache.  Light-headedness, especially when you stand up from a sitting position.  Fainting (syncope). Symptoms of severe dehydration may include:   Changes in skin, such as:  Cold and clammy skin.  Blotchy (mottled) or pale skin.  Skin that does  not quickly return to normal after being lightly pinched and released (poor skin turgor).  Changes in body fluids, such as:  Extreme thirst.  No tear production.  Inability to sweat when body temperature is high, such as in hot weather.  Very little urine production.  Changes in vital signs, such as:  Weak pulse.  Pulse that is more than 100 beats a minute when sitting still.  Rapid breathing.  Low blood pressure.  Other changes, such as:  Sunken eyes.  Cold hands and feet.  Confusion.  Lack of energy (lethargy).  Difficulty waking up from sleep.  Short-term weight loss.  Unconsciousness. How is this diagnosed? This condition is diagnosed based on your symptoms and a physical exam. Blood and urine tests may be done to help confirm the diagnosis. How is this treated? Treatment for this condition depends on the severity. Mild or moderate dehydration can often be treated at home. Treatment should be started right away. Do not wait until dehydration becomes severe. Severe dehydration is an emergency and it needs to be treated in a hospital. Treatment for mild dehydration may include:   Drinking more fluids.  Replacing salts and minerals in your blood (electrolytes) that you may have lost. Treatment for moderate dehydration may include:   Drinking an oral rehydration solution (ORS). This is a drink that helps you replace fluids and electrolytes (rehydrate). It can be found at pharmacies and retail stores. Treatment for severe dehydration may include:   Receiving fluids through an IV tube.  Receiving an electrolyte solution through a feeding tube that is   passed through your nose and into your stomach (nasogastric tube, or NG tube).  Correcting any abnormalities in electrolytes.  Treating the underlying cause of dehydration. Follow these instructions at home:  If directed by your health care provider, drink an ORS:  Make an ORS by following instructions on the  package.  Start by drinking small amounts, about  cup (120 mL) every 5-10 minutes.  Slowly increase how much you drink until you have taken the amount recommended by your health care provider.  Drink enough clear fluid to keep your urine clear or pale yellow. If you were told to drink an ORS, finish the ORS first, then start slowly drinking other clear fluids. Drink fluids such as:  Water. Do not drink only water. Doing that can lead to having too little salt (sodium) in the body (hyponatremia).  Ice chips.  Fruit juice that you have added water to (diluted fruit juice).  Low-calorie sports drinks.  Avoid:  Alcohol.  Drinks that contain a lot of sugar. These include high-calorie sports drinks, fruit juice that is not diluted, and soda.  Caffeine.  Foods that are greasy or contain a lot of fat or sugar.  Take over-the-counter and prescription medicines only as told by your health care provider.  Do not take sodium tablets. This can lead to having too much sodium in the body (hypernatremia).  Eat foods that contain a healthy balance of electrolytes, such as bananas, oranges, potatoes, tomatoes, and spinach.  Keep all follow-up visits as told by your health care provider. This is important. Contact a health care provider if:  You have abdominal pain that:  Gets worse.  Stays in one area (localizes).  You have a rash.  You have a stiff neck.  You are more irritable than usual.  You are sleepier or more difficult to wake up than usual.  You feel weak or dizzy.  You feel very thirsty.  You have urinated only a small amount of very dark urine over 6-8 hours. Get help right away if:  You have symptoms of severe dehydration.  You cannot drink fluids without vomiting.  Your symptoms get worse with treatment.  You have a fever.  You have a severe headache.  You have vomiting or diarrhea that:  Gets worse.  Does not go away.  You have blood or green matter  (bile) in your vomit.  You have blood in your stool. This may cause stool to look black and tarry.  You have not urinated in 6-8 hours.  You faint.  Your heart rate while sitting still is over 100 beats a minute.  You have trouble breathing. This information is not intended to replace advice given to you by your health care provider. Make sure you discuss any questions you have with your health care provider. Document Released: 11/28/2005 Document Revised: 06/24/2016 Document Reviewed: 01/22/2016 Elsevier Interactive Patient Education  2017 [employer]  

## 2017-03-23 NOTE — Progress Notes (Signed)
Pt saw Dr. [name] on 07/13/22. Had laryngoscopy with trach change. Trach changed from #4 to #6. Pt said it all went well and that he feels more comfortable with new trach. Speech has even improved a bit.

## 2017-03-29 MED FILL — LEVOTHYROXINE 100 MCG TAB: 100 | 30 days supply | Qty: 30 | Fill #1

## 2017-04-03 ENCOUNTER — Ambulatory Visit (HOSPITAL_BASED_OUTPATIENT_CLINIC_OR_DEPARTMENT_OTHER): Payer: Medicaid Other

## 2017-04-03 ENCOUNTER — Ambulatory Visit: Payer: Medicaid Other

## 2017-04-03 ENCOUNTER — Ambulatory Visit (HOSPITAL_BASED_OUTPATIENT_CLINIC_OR_DEPARTMENT_OTHER): Payer: Medicaid Other | Admitting: Hematology and Oncology

## 2017-04-03 ENCOUNTER — Other Ambulatory Visit (HOSPITAL_BASED_OUTPATIENT_CLINIC_OR_DEPARTMENT_OTHER): Payer: Medicaid Other

## 2017-04-03 ENCOUNTER — Encounter: Payer: Self-pay | Admitting: Hematology and Oncology

## 2017-04-03 VITALS — BP 122/86 | HR 86 | Temp 99.0°F | Resp 18 | Ht 70.0 in | Wt 170.9 lb

## 2017-04-03 DIAGNOSIS — E039 Hypothyroidism, unspecified: Secondary | ICD-10-CM

## 2017-04-03 DIAGNOSIS — G893 Neoplasm related pain (acute) (chronic): Secondary | ICD-10-CM

## 2017-04-03 DIAGNOSIS — Z930 Tracheostomy status: Secondary | ICD-10-CM

## 2017-04-03 DIAGNOSIS — C321 Malignant neoplasm of supraglottis: Secondary | ICD-10-CM

## 2017-04-03 DIAGNOSIS — Z5111 Encounter for antineoplastic chemotherapy: Secondary | ICD-10-CM

## 2017-04-03 DIAGNOSIS — T451X5A Adverse effect of antineoplastic and immunosuppressive drugs, initial encounter: Secondary | ICD-10-CM

## 2017-04-03 DIAGNOSIS — D6481 Anemia due to antineoplastic chemotherapy: Secondary | ICD-10-CM | POA: Diagnosis not present

## 2017-04-03 LAB — COMPREHENSIVE METABOLIC PANEL
ALBUMIN: 3.7 g/dL (ref 3.5–5.0)
ALK PHOS: 53 U/L (ref 40–150)
ALT: 10 U/L (ref 0–55)
AST: 13 U/L (ref 5–34)
Anion Gap: 9 mEq/L (ref 3–11)
BUN: 11.7 mg/dL (ref 7.0–26.0)
CALCIUM: 9.5 mg/dL (ref 8.4–10.4)
CO2: 27 mEq/L (ref 22–29)
CREATININE: 0.7 mg/dL (ref 0.7–1.3)
Chloride: 103 mEq/L (ref 98–109)
EGFR: >90 mL/min/{1.73_m2} (ref >90)
GLUCOSE: 114 mg/dL (ref 70–140)
POTASSIUM: 4 meq/L (ref 3.5–5.1)
SODIUM: 139 meq/L (ref 136–145)
Total Protein: 6.6 g/dL (ref 6.4–8.3)

## 2017-04-03 LAB — CBC WITH DIFFERENTIAL/PLATELET
BASO%: 0.5 % (ref 0.0–2.0)
Basophils Absolute: 0 10*3/uL (ref 0.0–0.1)
EOS%: 0.4 % (ref 0.0–7.0)
Eosinophils Absolute: 0 10*3/uL (ref 0.0–0.5)
HEMATOCRIT: 36.5 % — AB (ref 38.4–49.9)
HGB: 12.3 g/dL — ABNORMAL LOW (ref 13.0–17.1)
LYMPH%: 6.3 % — AB (ref 14.0–49.0)
MCH: 34 pg — AB (ref 27.2–33.4)
MCHC: 33.6 g/dL (ref 32.0–36.0)
MCV: 101.3 fL — ABNORMAL HIGH (ref 79.3–98.0)
MONO#: 0.9 10*3/uL (ref 0.1–0.9)
MONO%: 10.9 % (ref 0.0–14.0)
NEUT%: 81.9 % — AB (ref 39.0–75.0)
NEUTROS ABS: 6.6 10*3/uL — AB (ref 1.5–6.5)
Platelets: 220 10*3/uL (ref 140–400)
RBC: 3.6 10*6/uL — ABNORMAL LOW (ref 4.20–5.82)
RDW: 16 % — ABNORMAL HIGH (ref 11.0–14.6)
WBC: 8 10*3/uL (ref 4.0–10.3)
lymph#: 0.5 10*3/uL — ABNORMAL LOW (ref 0.9–3.3)

## 2017-04-03 MED ORDER — PALONOSETRON HCL INJECTION 0.25 MG/5ML
0.2500 mg | Freq: Once | INTRAVENOUS | Status: AC
Start: 2017-04-03 — End: 2017-04-03
  Administered 2017-04-03: 0.25 mg via INTRAVENOUS

## 2017-04-03 MED ORDER — FAMOTIDINE IN NACL 20-0.9 MG/50ML-% IV SOLN
20.0000 mg | Freq: Once | INTRAVENOUS | Status: AC
  Administered 2017-04-03: 20 mg via INTRAVENOUS

## 2017-04-03 MED ORDER — PALONOSETRON HCL INJECTION 0.25 MG/5ML
INTRAVENOUS | Status: AC
  Filled 2017-04-03: qty 5

## 2017-04-03 MED ORDER — FAMOTIDINE IN NACL 20-0.9 MG/50ML-% IV SOLN
INTRAVENOUS | Status: AC
  Filled 2017-04-03: qty 50

## 2017-04-03 MED ORDER — SODIUM CHLORIDE 0.9% FLUSH
10.0000 mL | Freq: Once | INTRAVENOUS | Status: AC
  Administered 2017-04-03: 10 mL
  Filled 2017-04-03: qty 10

## 2017-04-03 MED ORDER — SODIUM CHLORIDE 0.9 % IV SOLN
Freq: Once | INTRAVENOUS | Status: AC
  Administered 2017-04-03: 13:00:00 via INTRAVENOUS
  Filled 2017-04-03: qty 5

## 2017-04-03 MED ORDER — MORPHINE SULFATE ER 60 MG PO TBCR: 60.0000 mg | EXTENDED_RELEASE_TABLET | Freq: Two times a day (BID) | ORAL | 0 refills | Status: DC

## 2017-04-03 MED ORDER — HEPARIN SOD (PORK) LOCK FLUSH 100 UNIT/ML IV SOLN
500.0000 [IU] | Freq: Once | INTRAVENOUS | Status: DC
  Filled 2017-04-03: qty 5

## 2017-04-03 MED ORDER — SODIUM CHLORIDE 0.9 % IV SOLN
Freq: Once | INTRAVENOUS | Status: AC
  Administered 2017-04-03: 13:00:00 via INTRAVENOUS

## 2017-04-03 MED ORDER — DIPHENHYDRAMINE HCL 50 MG/ML IJ SOLN
50.0000 mg | Freq: Once | INTRAMUSCULAR | Status: AC
  Administered 2017-04-03: 50 mg via INTRAVENOUS

## 2017-04-03 MED ORDER — HEPARIN SOD (PORK) LOCK FLUSH 100 UNIT/ML IV SOLN
500.0000 [IU] | Freq: Once | INTRAVENOUS | Status: AC | PRN
  Administered 2017-04-03: 500 [IU]
  Filled 2017-04-03: qty 5

## 2017-04-03 MED ORDER — PACLITAXEL CHEMO INJECTION 300 MG/50ML
80.0000 mg/m2 | Freq: Once | INTRAVENOUS | Status: AC
  Administered 2017-04-03: 156 mg via INTRAVENOUS
  Filled 2017-04-03: qty 26

## 2017-04-03 MED ORDER — MORPHINE SULFATE 30 MG PO TABS: 60.0000 mg | ORAL_TABLET | ORAL | 0 refills | Status: DC | PRN

## 2017-04-03 MED ORDER — SODIUM CHLORIDE 0.9% FLUSH
10.0000 mL | INTRAVENOUS | Status: DC | PRN
  Administered 2017-04-03: 10 mL
  Filled 2017-04-03: qty 10

## 2017-04-03 MED ORDER — DIPHENHYDRAMINE HCL 50 MG/ML IJ SOLN
INTRAMUSCULAR | Status: AC
  Filled 2017-04-03: qty 1

## 2017-04-03 MED FILL — MORPHINE SULFATE IR 30 MG T: 30 | 15 days supply | Qty: 90 | Fill #0

## 2017-04-03 MED FILL — MORPHINE SULF 60 MG TAB SA: 60 | 30 days supply | Qty: 60 | Fill #0

## 2017-04-03 NOTE — Assessment & Plan Note (Signed)
He denies recent bleeding from the tracheostomy site. I recommend he continue close follow-up to see his ENT doctor for tracheostomy site care

## 2017-04-03 NOTE — Assessment & Plan Note (Signed)
This is overall stable He complained MS Contin caued too much sedation He can continue to take MS Contin at night and IR morphine as needed for pain during day time

## 2017-04-03 NOTE — Progress Notes (Signed)
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[place])   03/15/2015 - 03/17/2015 Hospital Admission    He was admitted to the hospital for evaluation of dysphagia, SOB, hemoptosis, hoarseness, 30-40 pound weight loss and worsening bilateral neck masses for 5 months      03/15/2015 Imaging    Ct showed extensive circumferential malignancy in the hypopharyngeal/supraglottic region with regional LN metastases      03/16/2015 Procedure    He underwent ULTRASOUND-GUIDED BIOPSY OF LEFT CERVICAL LYMPH NODES      03/16/2015 Pathology Results    Accession: [id number] LN biopsy showed invasive squamous cell cancer      03/16/2015 Pathology Results    Accession: [id number] showed atypical squamous cells      03/25/2015 - 04/07/2015 Hospital Admission    He was admitted to the hospital and underwent tracheostomy placement, feeeding tube placement but subsequently left [facility]      03/26/2015 Surgery    He had multiple extraction of tooth numbers 1, 2, 5, 6, 7, 8, 9, 10, 11, 12, 13, 18, 19, 21, 22, 23, 24, 25, 26, 27, 28, and 29. and 4 Quadrants of alveoloplasty      03/26/2015 Surgery    He underwent tracheostomy      03/31/2015 Surgery    He had open gastrostomy tube placement by Dr. Donne Hazel      04/16/2015 - 05/18/2015 Radiation Therapy    Laryngopharynx and bilateral neck / 50 Gy in 20 fractions to gross disease, 45 Gy in 20 fractions to high risk nodal echelons  Beams/energy: Helical IMRT / 6 MV photons      04/16/2015 Procedure    Fluoroscopic reposition of the 18 French gastrostomy confirmed back in the stomach,      07/03/2015 Procedure    IR performed  replacement of gastrostomy tube with a new 66 French balloon retention tube      10/01/2015 Imaging    PEt scan showed persistent hypermetabolism within the primary supraglottic laryngeal tumor and within right retropharyngeal, bilateral level II and left level IV cervical nodal metastases      10/28/2015 Procedure    He had placement of PICC line. The IR was not able to place PORT due to suspected upper respiratory infection      11/13/2015 - 03/21/2016 Chemotherapy    He received 5FU, carboplatin chemo with weekly Erbitux      11/19/2015 Surgery    Gastrostomy tube replaced.      11/30/2015 Procedure    Placement of right jugular port-a-cath.      11/30/2015 Procedure    PICC removed.      12/30/2015 Imaging    PET CT showed positive response to Rx      02/26/2016 Procedure    He underwent direct laryngoscopy with biopsy. Esophageal dilatation.       02/26/2016 Pathology Results    Repeat biopsy of supraglottis showed persistent disease      02/29/2016 Procedure    Gastrostomy tube exchanged.      03/28/2016 PET scan    Hypermetabolic tissue in the posterior RIGHT hypopharynx is similar in pattern to PET-CT of 12/30/2015 but increased in metabolic activity.2. Hypermetabolic tissue /  lymph nodes in the LEFT supraclavicular nodal station are in a similar pattern       04/04/2016 - 11/29/2016 Chemotherapy    He started on palliative chemotherapy with pembrolizumab      05/03/2016 Imaging    MRI head is negative      06/02/2016 Imaging    Mild improvement of diffuse pharyngeal and supraglottic edema.Increased asymmetry of right-sided hypopharyngeal/supraglottic softtissue compared to the prior neck CT with FDG uptake in this region on prior PET-CT. Residual/recurrent tumor is possible      08/09/2016 Procedure    IR placed new 20 French percutaneous gastrostomy tube.      08/26/2016 Imaging    Ct neck showed unchanged diffuse pharyngeal and supraglottic edema as well  as asymmetric soft tissue thickening on the right. Slightly decreased size of some left level II lymph nodes. Unchanged lymphadenopathy elsewhere in the neck. Decreased size of thyroid mass. No evidence of metastatic cancer to the chest      12/13/2016 Imaging    Ct chest showed no evidence of thoracic metastatic disease or primary thoracic malignancy.      12/13/2016 Imaging    Ct neck showed progression of of RIGHT supraglottic mass compared with priors, approximate size 24 x 27 x 27 mm. Extension caudally along the RIGHT area epiglottic fold with increasing mass effect on the airway. Tracheostomy satisfactory position. Stable to slightly improved malignant adenopathy.      12/26/2016 -  Chemotherapy    He received chemotherapy with weekly Taxol       02/10/2017 Imaging    Very similar appearance to the study of January. Right sided supraglottic to glottic mass is similar, perhaps a few mm smaller. Bilateral enlarged nodes appear the same. No progressive finding      02/10/2017 Imaging    No evidence of metastatic disease in the abdomen/pelvis. Percutaneous gastrostomy in satisfactory position. Cholelithiasis, without associated inflammatory changes.       INTERVAL HISTORY: Please see below for problem oriented charting. He is seen prior to chemotherapy today He denies worsening peripheral neuropathy He saw ENT physician recently.  The tracheostomy was exchanged and he felt better No significant recent nausea vomiting His appetite is stable, no recent weight loss He denies recent smoking Pain control is stable with current prescription pain medicine  REVIEW OF SYSTEMS:   Constitutional: Denies fevers, chills or abnormal weight loss Eyes: Denies blurriness of vision Ears, nose, mouth, throat, and face: Denies mucositis or sore throat Respiratory: Denies cough, dyspnea or wheezes Cardiovascular: Denies palpitation, chest discomfort or lower extremity swelling Skin: Denies abnormal  skin rashes Lymphatics: Denies new lymphadenopathy or easy bruising Neurological:Denies numbness, tingling or new weaknesses Behavioral/Psych: Mood is stable, no new changes  All other systems were reviewed with the patient and are negative.  I have reviewed the past medical history, past surgical history, social history and family history with the patient and they are unchanged from previous note.  ALLERGIES:  is allergic to aspirin.  MEDICATIONS:  Current Outpatient Prescriptions  Medication Sig Dispense Refill  . fluticasone (FLONASE) 50 MCG/ACT nasal spray Place 2 sprays into both nostrils daily. 16 g 2  . guaiFENesin (ROBITUSSIN) 100 MG/5ML SOLN Take 5 mLs (100 mg total) by mouth every 4 (four) hours as needed for cough or to loosen phlegm. 473 mL 3  . levothyroxine (SYNTHROID) 100 MCG tablet Take 1 tablet (100 mcg total) by mouth daily before breakfast. 30 tablet 9  . lidocaine (XYLOCAINE) 2 % solution  Use as directed 15 mLs in the mouth or throat every 8 (eight) hours as needed for mouth pain. 100 mL 0  . morphine (MS CONTIN) 60 MG 12 hr tablet Take 1 tablet (60 mg total) by mouth every 12 (twelve) hours. 60 tablet 0  . morphine (MSIR) 30 MG tablet Take 2 tablets (60 mg total) by mouth every 4 (four) hours as needed for severe pain. 90 tablet 0  . Nutritional Supplements (FEEDING SUPPLEMENT, JEVITY 1.5 CAL/FIBER,) LIQD Give 1 can Osmolite 1.2 + 1 can of Jevity 1.5 QID via PEG with 60 cc free water before and after bolus. Flush with 240 cc free water BID between feedings. 948 mL   . omeprazole (PRILOSEC) 20 MG capsule Take 1 capsule (20 mg total) by mouth 2 (two) times daily. 60 capsule 0  . ondansetron (ZOFRAN) 8 MG tablet Take 1 tablet (8 mg total) by mouth every 8 (eight) hours as needed for nausea. 60 tablet 3  . SODIUM CHLORIDE, EXTERNAL, 0.9 % SOLN Use to clean around Trach and perform Trach care once daily and PRN 1000 mL 11  . lactulose (CHRONULAC) 10 GM/15ML solution Take 15 mLs  (10 g total) by mouth 3 (three) times daily. (Patient not taking: Reported on 02/13/2017) 473 mL 2  . loperamide (IMODIUM) 2 MG capsule Take 1 capsule (2 mg total) by mouth as needed for diarrhea or loose stools. (Patient not taking: Reported on 03/14/2017) 30 capsule 1  . LORazepam (ATIVAN) 1 MG tablet Take 1 tablet (1 mg total) by mouth 2 (two) times daily as needed for anxiety (or nausea). (Patient not taking: Reported on 02/13/2017) 10 tablet 0  . polyethylene glycol (MIRALAX) packet Take 17 g by mouth daily. (Patient not taking: Reported on 01/09/2017) 14 each 0  . triamcinolone (NASACORT AQ) 55 MCG/ACT AERO nasal inhaler Place 2 sprays into the nose daily. (Patient not taking: Reported on 04/03/2017) 1 Inhaler 12   Current Facility-Administered Medications  Medication Dose Route Frequency Provider Last Rate Last Dose  . guaiFENesin-dextromethorphan (ROBITUSSIN DM) 100-10 MG/5ML syrup 10 mL  10 mL Oral Q4H PRN [name], MD   10 mL at 01/09/17 1427   Facility-Administered Medications Ordered in Other Visits  Medication Dose Route Frequency Provider Last Rate Last Dose  . heparin lock flush 100 unit/mL  500 Units Intracatheter Once PRN [name], MD      . HYDROmorphone (DILAUDID) injection 2 mg  2 mg Intravenous Q2H PRN [name], MD   2 mg at 12/26/16 1440  . PACLitaxel (TAXOL) 156 mg in dextrose 5 % 250 mL chemo infusion (> 80mg /m2)  80 mg/m2 (Treatment Plan Recorded) Intravenous Once [name], MD      . sodium chloride 0.9 % injection 10 mL  10 mL Intracatheter PRN [name], MD   10 mL at 01/02/17 1107  . sodium chloride flush (NS) 0.9 % injection 10 mL  10 mL Intracatheter PRN [name], MD   10 mL at 12/26/16 1708  . sodium chloride flush (NS) 0.9 % injection 10 mL  10 mL Intracatheter PRN [name], MD        PHYSICAL EXAMINATION: ECOG PERFORMANCE STATUS: 1 - Symptomatic but completely ambulatory  Vitals:   04/03/17 1213  BP: 122/86  Pulse: 86  Resp: 18  Temp: 99 F (37.2 C)    Filed Weights   04/03/17 1213  Weight: 170 lb 14.4 oz (77.5 kg)    GENERAL:alert, no distress and comfortable SKIN: skin  color, texture, turgor are normal, no rashes or significant lesions EYES: normal, Conjunctiva are pink and non-injected, sclera clear OROPHARYNX:no exudate, no erythema and lips, buccal mucosa, and tongue normal  NECK: Tracheostomy site looks okay  LYMPH:  no palpable lymphadenopathy in the cervical, axillary or inguinal LUNGS: clear to auscultation and percussion with normal breathing effort HEART: regular rate & rhythm and no murmurs and no lower extremity edema ABDOMEN:abdomen soft, non-tender and normal bowel sounds.  Feeding tube site looks okay Musculoskeletal:no cyanosis of digits and no clubbing  NEURO: alert & oriented x 3 with fluent speech, no focal motor/sensory deficits  LABORATORY DATA:  I have reviewed the data as listed    Component Value Date/Time   NA 139 04/03/2017 1139   K 4.0 04/03/2017 1139   CL 99 (L) 12/27/2016 1748   CO2 27 04/03/2017 1139   GLUCOSE 114 04/03/2017 1139   BUN 11.7 04/03/2017 1139   CREATININE 0.7 04/03/2017 1139   CALCIUM 9.5 04/03/2017 1139   PROT 6.6 04/03/2017 1139   ALBUMIN 3.7 04/03/2017 1139   AST 13 04/03/2017 1139   ALT 10 04/03/2017 1139   ALKPHOS 53 04/03/2017 1139   BILITOT <0.22 04/03/2017 1139   GFRNONAA >60 12/27/2016 1748   GFRAA >60 12/27/2016 1748    No results found for: SPEP, UPEP  Lab Results  Component Value Date   WBC 8.0 04/03/2017   NEUTROABS 6.6 (H) 04/03/2017   HGB 12.3 (L) 04/03/2017   HCT 36.5 (L) 04/03/2017   MCV 101.3 (H) 04/03/2017   PLT 220 04/03/2017      Chemistry      Component Value Date/Time   NA 139 04/03/2017 1139   K 4.0 04/03/2017 1139   CL 99 (L) 12/27/2016 1748   CO2 27 04/03/2017 1139   BUN 11.7 04/03/2017 1139   CREATININE 0.7 04/03/2017 1139      Component Value Date/Time   CALCIUM 9.5 04/03/2017 1139   ALKPHOS 53 04/03/2017 1139   AST 13  04/03/2017 1139   ALT 10 04/03/2017 1139   BILITOT <0.22 04/03/2017 1139       ASSESSMENT & PLAN:  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[facility]) He tolerated treatment well except for nausea We will proceed with treatment without dose adjustment, on days 1 and 8 and rest day 15, for cycle of every 21 days I will see him on day 1 of every cycle He will continue treatment without dose adjustment His next imaging study would be due in June  Anemia due to antineoplastic chemotherapy This is likely due to recent treatment. The patient denies recent history of bleeding such as epistaxis, hematuria or hematochezia. He is asymptomatic from the anemia. I will observe for now.  He does not require transfusion now. I will continue the chemotherapy at current dose without dosage adjustment.  If the anemia gets progressive worse in the future, I might have to delay his treatment or adjust the chemotherapy dose.   Tracheostomy status ([place]) He denies recent bleeding from the tracheostomy site. I recommend he continue close follow-up to see his ENT doctor for tracheostomy site care  Cancer associated pain This is overall stable He complained MS Contin caued too much sedation He can continue to take MS Contin at night and IR morphine as needed for pain during day time  Acquired hypothyroidism He has acquired hypothyroidism after prior radiation treatment. His recent TSH is stable I recommend close observation for now with plan to recheck in July  Orders Placed This Encounter  Procedures  . TSH    Standing Status:   Future    Standing Expiration Date:   05/08/2018   All questions were answered. The patient knows to call the clinic with any problems, questions or concerns. No barriers to learning was detected. I spent 20 minutes counseling the patient face to face. The total time spent in the appointment was 30 minutes and more than 50% was on counseling and review of test results     [name], MD 04/03/2017 1:42 PM

## 2017-04-03 NOTE — Patient Instructions (Signed)

## 2017-04-03 NOTE — Patient Instructions (Signed)
[facility] Discharge Instructions for Patients Receiving Chemotherapy  Today you received the following chemotherapy agents Taxol   To help prevent nausea and vomiting after your treatment, we encourage you to take your nausea medication as directed.   If you develop nausea and vomiting that is not controlled by your nausea medication, call the clinic.   BELOW ARE SYMPTOMS THAT SHOULD BE REPORTED IMMEDIATELY:  *FEVER GREATER THAN 100.5 F  *CHILLS WITH OR WITHOUT FEVER  NAUSEA AND VOMITING THAT IS NOT CONTROLLED WITH YOUR NAUSEA MEDICATION  *UNUSUAL SHORTNESS OF BREATH  *UNUSUAL BRUISING OR BLEEDING  TENDERNESS IN MOUTH AND THROAT WITH OR WITHOUT PRESENCE OF ULCERS  *URINARY PROBLEMS  *BOWEL PROBLEMS  UNUSUAL RASH Items with * indicate a potential emergency and should be followed up as soon as possible.  Feel free to call the clinic you have any questions or concerns. The clinic phone number is [phone].  Please show the CHEMO ALERT CARD at check-in to the Emergency Department and triage nurse.   

## 2017-04-03 NOTE — Assessment & Plan Note (Signed)
He tolerated treatment well except for nausea We will proceed with treatment without dose adjustment, on days 1 and 8 and rest day 15, for cycle of every 21 days I will see him on day 1 of every cycle He will continue treatment without dose adjustment His next imaging study would be due in June

## 2017-04-03 NOTE — Assessment & Plan Note (Signed)
He has acquired hypothyroidism after prior radiation treatment. His recent TSH is stable I recommend close observation for now with plan to recheck in July

## 2017-04-03 NOTE — Assessment & Plan Note (Signed)
This is likely due to recent treatment. The patient denies recent history of bleeding such as epistaxis, hematuria or hematochezia. He is asymptomatic from the anemia. I will observe for now.  He does not require transfusion now. I will continue the chemotherapy at current dose without dosage adjustment.  If the anemia gets progressive worse in the future, I might have to delay his treatment or adjust the chemotherapy dose.  

## 2017-04-04 ENCOUNTER — Ambulatory Visit (HOSPITAL_BASED_OUTPATIENT_CLINIC_OR_DEPARTMENT_OTHER): Payer: Medicaid Other

## 2017-04-04 ENCOUNTER — Other Ambulatory Visit: Payer: Self-pay | Admitting: Hematology and Oncology

## 2017-04-04 VITALS — BP 128/88 | HR 80 | Temp 98.6°F | Resp 18 | Ht 70.0 in | Wt 172.0 lb

## 2017-04-04 DIAGNOSIS — C321 Malignant neoplasm of supraglottis: Secondary | ICD-10-CM | POA: Diagnosis present

## 2017-04-04 DIAGNOSIS — C770 Secondary and unspecified malignant neoplasm of lymph nodes of head, face and neck: Secondary | ICD-10-CM

## 2017-04-04 MED ORDER — HYDROMORPHONE HCL 4 MG/ML IJ SOLN
2.0000 mg | Freq: Once | INTRAMUSCULAR | Status: AC
  Administered 2017-04-04: 2 mg via INTRAVENOUS

## 2017-04-04 MED ORDER — ALUM & MAG HYDROXIDE-SIMETH 200-200-20 MG/5ML PO SUSP
30.0000 mL | Freq: Once | ORAL | Status: AC
  Administered 2017-04-04: 30 mL via ORAL
  Filled 2017-04-04: qty 30

## 2017-04-04 MED ORDER — HEPARIN SOD (PORK) LOCK FLUSH 100 UNIT/ML IV SOLN
500.0000 [IU] | Freq: Once | INTRAVENOUS | Status: AC | PRN
  Administered 2017-04-04: 500 [IU]
  Filled 2017-04-04: qty 5

## 2017-04-04 MED ORDER — SODIUM CHLORIDE 0.9 % IV SOLN
Freq: Once | INTRAVENOUS | Status: AC
  Administered 2017-04-04: 15:00:00 via INTRAVENOUS

## 2017-04-04 MED ORDER — HYDROMORPHONE HCL 4 MG/ML IJ SOLN
INTRAMUSCULAR | Status: AC
  Filled 2017-04-04: qty 1

## 2017-04-04 MED ORDER — SODIUM CHLORIDE 0.9 % IJ SOLN
10.0000 mL | INTRAMUSCULAR | Status: DC | PRN
  Administered 2017-04-04: 10 mL
  Filled 2017-04-04: qty 10

## 2017-04-04 MED FILL — SODIUM CHLORIDE 0.9% IRRIG.: 0.9 | 30 days supply | Qty: 1000 | Fill #0

## 2017-04-04 NOTE — Progress Notes (Signed)
RN visit for IV fluids. 

## 2017-04-04 NOTE — Patient Instructions (Signed)
Dehydration, Adult Dehydration is a condition in which there is not enough fluid or water in the body. This happens when you lose more fluids than you take in. Important organs, such as the kidneys, brain, and heart, cannot function without a proper amount of fluids. Any loss of fluids from the body can lead to dehydration. Dehydration can range from mild to severe. This condition should be treated right away to prevent it from becoming severe. What are the causes? This condition may be caused by:  Vomiting.  Diarrhea.  Excessive sweating, such as from heat exposure or exercise.  Not drinking enough fluid, especially:  When ill.  While doing activity that requires a lot of energy.  Excessive urination.  Fever.  Infection.  Certain medicines, such as medicines that cause the body to lose excess fluid (diuretics).  Inability to access safe drinking water.  Reduced physical ability to get adequate water and food. What increases the risk? This condition is more likely to develop in people:  Who have a poorly controlled long-term (chronic) illness, such as diabetes, heart disease, or kidney disease.  Who are age 65 or older.  Who are disabled.  Who live in a place with high altitude.  Who play endurance sports. What are the signs or symptoms? Symptoms of mild dehydration may include:   Thirst.  Dry lips.  Slightly dry mouth.  Dry, warm skin.  Dizziness. Symptoms of moderate dehydration may include:   Very dry mouth.  Muscle cramps.  Dark urine. Urine may be the color of tea.  Decreased urine production.  Decreased tear production.  Heartbeat that is irregular or faster than normal (palpitations).  Headache.  Light-headedness, especially when you stand up from a sitting position.  Fainting (syncope). Symptoms of severe dehydration may include:   Changes in skin, such as:  Cold and clammy skin.  Blotchy (mottled) or pale skin.  Skin that does  not quickly return to normal after being lightly pinched and released (poor skin turgor).  Changes in body fluids, such as:  Extreme thirst.  No tear production.  Inability to sweat when body temperature is high, such as in hot weather.  Very little urine production.  Changes in vital signs, such as:  Weak pulse.  Pulse that is more than 100 beats a minute when sitting still.  Rapid breathing.  Low blood pressure.  Other changes, such as:  Sunken eyes.  Cold hands and feet.  Confusion.  Lack of energy (lethargy).  Difficulty waking up from sleep.  Short-term weight loss.  Unconsciousness. How is this diagnosed? This condition is diagnosed based on your symptoms and a physical exam. Blood and urine tests may be done to help confirm the diagnosis. How is this treated? Treatment for this condition depends on the severity. Mild or moderate dehydration can often be treated at home. Treatment should be started right away. Do not wait until dehydration becomes severe. Severe dehydration is an emergency and it needs to be treated in a hospital. Treatment for mild dehydration may include:   Drinking more fluids.  Replacing salts and minerals in your blood (electrolytes) that you may have lost. Treatment for moderate dehydration may include:   Drinking an oral rehydration solution (ORS). This is a drink that helps you replace fluids and electrolytes (rehydrate). It can be found at pharmacies and retail stores. Treatment for severe dehydration may include:   Receiving fluids through an IV tube.  Receiving an electrolyte solution through a feeding tube that is   passed through your nose and into your stomach (nasogastric tube, or NG tube).  Correcting any abnormalities in electrolytes.  Treating the underlying cause of dehydration. Follow these instructions at home:  If directed by your health care provider, drink an ORS:  Make an ORS by following instructions on the  package.  Start by drinking small amounts, about  cup (120 mL) every 5-10 minutes.  Slowly increase how much you drink until you have taken the amount recommended by your health care provider.  Drink enough clear fluid to keep your urine clear or pale yellow. If you were told to drink an ORS, finish the ORS first, then start slowly drinking other clear fluids. Drink fluids such as:  Water. Do not drink only water. Doing that can lead to having too little salt (sodium) in the body (hyponatremia).  Ice chips.  Fruit juice that you have added water to (diluted fruit juice).  Low-calorie sports drinks.  Avoid:  Alcohol.  Drinks that contain a lot of sugar. These include high-calorie sports drinks, fruit juice that is not diluted, and soda.  Caffeine.  Foods that are greasy or contain a lot of fat or sugar.  Take over-the-counter and prescription medicines only as told by your health care provider.  Do not take sodium tablets. This can lead to having too much sodium in the body (hypernatremia).  Eat foods that contain a healthy balance of electrolytes, such as bananas, oranges, potatoes, tomatoes, and spinach.  Keep all follow-up visits as told by your health care provider. This is important. Contact a health care provider if:  You have abdominal pain that:  Gets worse.  Stays in one area (localizes).  You have a rash.  You have a stiff neck.  You are more irritable than usual.  You are sleepier or more difficult to wake up than usual.  You feel weak or dizzy.  You feel very thirsty.  You have urinated only a small amount of very dark urine over 6-8 hours. Get help right away if:  You have symptoms of severe dehydration.  You cannot drink fluids without vomiting.  Your symptoms get worse with treatment.  You have a fever.  You have a severe headache.  You have vomiting or diarrhea that:  Gets worse.  Does not go away.  You have blood or green matter  (bile) in your vomit.  You have blood in your stool. This may cause stool to look black and tarry.  You have not urinated in 6-8 hours.  You faint.  Your heart rate while sitting still is over 100 beats a minute.  You have trouble breathing. This information is not intended to replace advice given to you by your health care provider. Make sure you discuss any questions you have with your health care provider. Document Released: 11/28/2005 Document Revised: 06/24/2016 Document Reviewed: 01/22/2016 Elsevier Interactive Patient Education  2017 [employer]  

## 2017-04-05 ENCOUNTER — Ambulatory Visit (HOSPITAL_BASED_OUTPATIENT_CLINIC_OR_DEPARTMENT_OTHER): Payer: Medicaid Other

## 2017-04-05 VITALS — BP 122/58 | HR 86 | Temp 99.6°F | Resp 18 | Ht 70.0 in | Wt 178.6 lb

## 2017-04-05 DIAGNOSIS — C770 Secondary and unspecified malignant neoplasm of lymph nodes of head, face and neck: Secondary | ICD-10-CM | POA: Diagnosis not present

## 2017-04-05 DIAGNOSIS — C321 Malignant neoplasm of supraglottis: Secondary | ICD-10-CM

## 2017-04-05 MED ORDER — ALUM & MAG HYDROXIDE-SIMETH 200-200-20 MG/5ML PO SUSP
30.0000 mL | Freq: Once | ORAL | Status: AC
  Administered 2017-04-05: 30 mL via ORAL
  Filled 2017-04-05: qty 30

## 2017-04-05 MED ORDER — SODIUM CHLORIDE 0.9 % IV SOLN
Freq: Once | INTRAVENOUS | Status: AC
  Administered 2017-04-05: 13:00:00 via INTRAVENOUS

## 2017-04-05 MED ORDER — SODIUM CHLORIDE 0.9 % IJ SOLN
10.0000 mL | INTRAMUSCULAR | Status: DC | PRN
  Administered 2017-04-05: 10 mL
  Filled 2017-04-05: qty 10

## 2017-04-05 MED ORDER — ONDANSETRON HCL 4 MG/2ML IJ SOLN: 8.0000 mg | Freq: Once | INTRAMUSCULAR | Status: DC

## 2017-04-05 MED ORDER — HEPARIN SOD (PORK) LOCK FLUSH 100 UNIT/ML IV SOLN
500.0000 [IU] | Freq: Once | INTRAVENOUS | Status: AC | PRN
Start: 2017-04-05 — End: 2017-04-05
  Administered 2017-04-05: 500 [IU]
  Filled 2017-04-05: qty 5

## 2017-04-05 NOTE — Progress Notes (Signed)
RN visit for IV fluids. 

## 2017-04-05 NOTE — Patient Instructions (Signed)
Dehydration, Adult Dehydration is a condition in which there is not enough fluid or water in the body. This happens when you lose more fluids than you take in. Important organs, such as the kidneys, brain, and heart, cannot function without a proper amount of fluids. Any loss of fluids from the body can lead to dehydration. Dehydration can range from mild to severe. This condition should be treated right away to prevent it from becoming severe. What are the causes? This condition may be caused by:  Vomiting.  Diarrhea.  Excessive sweating, such as from heat exposure or exercise.  Not drinking enough fluid, especially:  When ill.  While doing activity that requires a lot of energy.  Excessive urination.  Fever.  Infection.  Certain medicines, such as medicines that cause the body to lose excess fluid (diuretics).  Inability to access safe drinking water.  Reduced physical ability to get adequate water and food. What increases the risk? This condition is more likely to develop in people:  Who have a poorly controlled long-term (chronic) illness, such as diabetes, heart disease, or kidney disease.  Who are age 65 or older.  Who are disabled.  Who live in a place with high altitude.  Who play endurance sports. What are the signs or symptoms? Symptoms of mild dehydration may include:   Thirst.  Dry lips.  Slightly dry mouth.  Dry, warm skin.  Dizziness. Symptoms of moderate dehydration may include:   Very dry mouth.  Muscle cramps.  Dark urine. Urine may be the color of tea.  Decreased urine production.  Decreased tear production.  Heartbeat that is irregular or faster than normal (palpitations).  Headache.  Light-headedness, especially when you stand up from a sitting position.  Fainting (syncope). Symptoms of severe dehydration may include:   Changes in skin, such as:  Cold and clammy skin.  Blotchy (mottled) or pale skin.  Skin that does  not quickly return to normal after being lightly pinched and released (poor skin turgor).  Changes in body fluids, such as:  Extreme thirst.  No tear production.  Inability to sweat when body temperature is high, such as in hot weather.  Very little urine production.  Changes in vital signs, such as:  Weak pulse.  Pulse that is more than 100 beats a minute when sitting still.  Rapid breathing.  Low blood pressure.  Other changes, such as:  Sunken eyes.  Cold hands and feet.  Confusion.  Lack of energy (lethargy).  Difficulty waking up from sleep.  Short-term weight loss.  Unconsciousness. How is this diagnosed? This condition is diagnosed based on your symptoms and a physical exam. Blood and urine tests may be done to help confirm the diagnosis. How is this treated? Treatment for this condition depends on the severity. Mild or moderate dehydration can often be treated at home. Treatment should be started right away. Do not wait until dehydration becomes severe. Severe dehydration is an emergency and it needs to be treated in a hospital. Treatment for mild dehydration may include:   Drinking more fluids.  Replacing salts and minerals in your blood (electrolytes) that you may have lost. Treatment for moderate dehydration may include:   Drinking an oral rehydration solution (ORS). This is a drink that helps you replace fluids and electrolytes (rehydrate). It can be found at pharmacies and retail stores. Treatment for severe dehydration may include:   Receiving fluids through an IV tube.  Receiving an electrolyte solution through a feeding tube that is   passed through your nose and into your stomach (nasogastric tube, or NG tube).  Correcting any abnormalities in electrolytes.  Treating the underlying cause of dehydration. Follow these instructions at home:  If directed by your health care provider, drink an ORS:  Make an ORS by following instructions on the  package.  Start by drinking small amounts, about  cup (120 mL) every 5-10 minutes.  Slowly increase how much you drink until you have taken the amount recommended by your health care provider.  Drink enough clear fluid to keep your urine clear or pale yellow. If you were told to drink an ORS, finish the ORS first, then start slowly drinking other clear fluids. Drink fluids such as:  Water. Do not drink only water. Doing that can lead to having too little salt (sodium) in the body (hyponatremia).  Ice chips.  Fruit juice that you have added water to (diluted fruit juice).  Low-calorie sports drinks.  Avoid:  Alcohol.  Drinks that contain a lot of sugar. These include high-calorie sports drinks, fruit juice that is not diluted, and soda.  Caffeine.  Foods that are greasy or contain a lot of fat or sugar.  Take over-the-counter and prescription medicines only as told by your health care provider.  Do not take sodium tablets. This can lead to having too much sodium in the body (hypernatremia).  Eat foods that contain a healthy balance of electrolytes, such as bananas, oranges, potatoes, tomatoes, and spinach.  Keep all follow-up visits as told by your health care provider. This is important. Contact a health care provider if:  You have abdominal pain that:  Gets worse.  Stays in one area (localizes).  You have a rash.  You have a stiff neck.  You are more irritable than usual.  You are sleepier or more difficult to wake up than usual.  You feel weak or dizzy.  You feel very thirsty.  You have urinated only a small amount of very dark urine over 6-8 hours. Get help right away if:  You have symptoms of severe dehydration.  You cannot drink fluids without vomiting.  Your symptoms get worse with treatment.  You have a fever.  You have a severe headache.  You have vomiting or diarrhea that:  Gets worse.  Does not go away.  You have blood or green matter  (bile) in your vomit.  You have blood in your stool. This may cause stool to look black and tarry.  You have not urinated in 6-8 hours.  You faint.  Your heart rate while sitting still is over 100 beats a minute.  You have trouble breathing. This information is not intended to replace advice given to you by your health care provider. Make sure you discuss any questions you have with your health care provider. Document Released: 11/28/2005 Document Revised: 06/24/2016 Document Reviewed: 01/22/2016 Elsevier Interactive Patient Education  2017 [employer]  

## 2017-04-06 ENCOUNTER — Other Ambulatory Visit: Payer: Self-pay | Admitting: Hematology and Oncology

## 2017-04-06 ENCOUNTER — Encounter: Payer: Self-pay | Admitting: Gastroenterology

## 2017-04-06 DIAGNOSIS — K209 Esophagitis, unspecified without bleeding: Secondary | ICD-10-CM

## 2017-04-06 DIAGNOSIS — R1033 Periumbilical pain: Secondary | ICD-10-CM

## 2017-04-10 ENCOUNTER — Ambulatory Visit: Payer: Medicaid Other

## 2017-04-10 ENCOUNTER — Telehealth: Payer: Self-pay | Admitting: Hematology and Oncology

## 2017-04-10 ENCOUNTER — Other Ambulatory Visit (HOSPITAL_BASED_OUTPATIENT_CLINIC_OR_DEPARTMENT_OTHER): Payer: Medicaid Other

## 2017-04-10 ENCOUNTER — Ambulatory Visit (HOSPITAL_BASED_OUTPATIENT_CLINIC_OR_DEPARTMENT_OTHER): Payer: Medicaid Other

## 2017-04-10 VITALS — BP 124/74 | HR 81 | Temp 98.5°F

## 2017-04-10 DIAGNOSIS — C321 Malignant neoplasm of supraglottis: Secondary | ICD-10-CM

## 2017-04-10 DIAGNOSIS — Z5111 Encounter for antineoplastic chemotherapy: Secondary | ICD-10-CM | POA: Diagnosis not present

## 2017-04-10 LAB — COMPREHENSIVE METABOLIC PANEL
ALBUMIN: 3.8 g/dL (ref 3.5–5.0)
ALK PHOS: 51 U/L (ref 40–150)
ALT: 11 U/L (ref 0–55)
ANION GAP: 9 meq/L (ref 3–11)
AST: 11 U/L (ref 5–34)
BUN: 12.9 mg/dL (ref 7.0–26.0)
CALCIUM: 9.6 mg/dL (ref 8.4–10.4)
CHLORIDE: 103 meq/L (ref 98–109)
CO2: 27 mEq/L (ref 22–29)
Creatinine: 0.7 mg/dL (ref 0.7–1.3)
Glucose: 104 mg/dl (ref 70–140)
POTASSIUM: 4.3 meq/L (ref 3.5–5.1)
Sodium: 139 mEq/L (ref 136–145)
Total Bilirubin: 0.23 mg/dL (ref 0.20–1.20)
Total Protein: 6.8 g/dL (ref 6.4–8.3)

## 2017-04-10 LAB — CBC WITH DIFFERENTIAL/PLATELET
BASO%: 0.2 % (ref 0.0–2.0)
BASOS ABS: 0 10*3/uL (ref 0.0–0.1)
EOS ABS: 0.1 10*3/uL (ref 0.0–0.5)
EOS%: 0.7 % (ref 0.0–7.0)
HEMATOCRIT: 34.5 % — AB (ref 38.4–49.9)
HEMOGLOBIN: 11.7 g/dL — AB (ref 13.0–17.1)
LYMPH#: 0.7 10*3/uL — AB (ref 0.9–3.3)
LYMPH%: 6.8 % — ABNORMAL LOW (ref 14.0–49.0)
MCH: 34.1 pg — ABNORMAL HIGH (ref 27.2–33.4)
MCHC: 33.9 g/dL (ref 32.0–36.0)
MCV: 100.6 fL — AB (ref 79.3–98.0)
MONO#: 0.7 10*3/uL (ref 0.1–0.9)
MONO%: 6.8 % (ref 0.0–14.0)
NEUT#: 9.2 10*3/uL — ABNORMAL HIGH (ref 1.5–6.5)
NEUT%: 85.5 % — AB (ref 39.0–75.0)
Platelets: 187 10*3/uL (ref 140–400)
RBC: 3.43 10*6/uL — ABNORMAL LOW (ref 4.20–5.82)
RDW: 15.7 % — AB (ref 11.0–14.6)
WBC: 10.7 10*3/uL — ABNORMAL HIGH (ref 4.0–10.3)

## 2017-04-10 MED ORDER — HEPARIN SOD (PORK) LOCK FLUSH 100 UNIT/ML IV SOLN
500.0000 [IU] | Freq: Once | INTRAVENOUS | Status: AC | PRN
  Administered 2017-04-10: 500 [IU]
  Filled 2017-04-10: qty 5

## 2017-04-10 MED ORDER — FAMOTIDINE IN NACL 20-0.9 MG/50ML-% IV SOLN
20.0000 mg | Freq: Once | INTRAVENOUS | Status: AC
  Administered 2017-04-10: 20 mg via INTRAVENOUS

## 2017-04-10 MED ORDER — PACLITAXEL CHEMO INJECTION 300 MG/50ML
80.0000 mg/m2 | Freq: Once | INTRAVENOUS | Status: AC
  Administered 2017-04-10: 156 mg via INTRAVENOUS
  Filled 2017-04-10: qty 26

## 2017-04-10 MED ORDER — DIPHENHYDRAMINE HCL 50 MG/ML IJ SOLN
50.0000 mg | Freq: Once | INTRAMUSCULAR | Status: AC
  Administered 2017-04-10: 50 mg via INTRAVENOUS

## 2017-04-10 MED ORDER — DIPHENHYDRAMINE HCL 50 MG/ML IJ SOLN
INTRAMUSCULAR | Status: AC
  Filled 2017-04-10: qty 1

## 2017-04-10 MED ORDER — FAMOTIDINE IN NACL 20-0.9 MG/50ML-% IV SOLN
INTRAVENOUS | Status: AC
  Filled 2017-04-10: qty 50

## 2017-04-10 MED ORDER — SODIUM CHLORIDE 0.9 % IV SOLN
Freq: Once | INTRAVENOUS | Status: AC
  Administered 2017-04-10: 12:00:00 via INTRAVENOUS
  Filled 2017-04-10: qty 5

## 2017-04-10 MED ORDER — SODIUM CHLORIDE 0.9% FLUSH
10.0000 mL | Freq: Once | INTRAVENOUS | Status: AC
  Administered 2017-04-10: 10 mL
  Filled 2017-04-10: qty 10

## 2017-04-10 MED ORDER — HYDROMORPHONE HCL 4 MG/ML IJ SOLN
INTRAMUSCULAR | Status: AC
  Filled 2017-04-10: qty 1

## 2017-04-10 MED ORDER — HYDROMORPHONE HCL 4 MG/ML IJ SOLN
2.0000 mg | Freq: Once | INTRAMUSCULAR | Status: AC
Start: 2017-04-10 — End: 2017-04-10
  Administered 2017-04-10: 2 mg via INTRAVENOUS

## 2017-04-10 MED ORDER — SODIUM CHLORIDE 0.9 % IV SOLN
Freq: Once | INTRAVENOUS | Status: AC
  Administered 2017-04-10: 11:00:00 via INTRAVENOUS

## 2017-04-10 MED ORDER — SODIUM CHLORIDE 0.9% FLUSH
10.0000 mL | INTRAVENOUS | Status: DC | PRN
  Administered 2017-04-10: 10 mL
  Filled 2017-04-10: qty 10

## 2017-04-10 MED ORDER — PALONOSETRON HCL INJECTION 0.25 MG/5ML
0.2500 mg | Freq: Once | INTRAVENOUS | Status: AC
  Administered 2017-04-10: 0.25 mg via INTRAVENOUS

## 2017-04-10 MED ORDER — PALONOSETRON HCL INJECTION 0.25 MG/5ML
INTRAVENOUS | Status: AC
  Filled 2017-04-10: qty 5

## 2017-04-10 NOTE — Progress Notes (Signed)
Pt reports PEG tube has been "backing up" during gravity feeds for past several days. He also reports stabbing pain at site. No drainage or erythema noted at insertion site. Abdomen is soft. Pt denies constipation or nausea. Has reflux at night despite sleeping with head elevated. Pt stated he will contact IR to have PEG evaluated. Next appt 5/1 for IV fluids in [facility].

## 2017-04-10 NOTE — Telephone Encounter (Signed)
Added additional appointments for May per 4/23 los. Spoke with patient and he will get new schedule tomorrow.

## 2017-04-11 ENCOUNTER — Ambulatory Visit: Payer: Medicaid Other

## 2017-04-11 ENCOUNTER — Ambulatory Visit (HOSPITAL_BASED_OUTPATIENT_CLINIC_OR_DEPARTMENT_OTHER): Payer: Medicaid Other

## 2017-04-11 VITALS — BP 118/87 | HR 96 | Temp 99.0°F | Resp 19 | Ht 70.0 in | Wt 168.8 lb

## 2017-04-11 DIAGNOSIS — C321 Malignant neoplasm of supraglottis: Secondary | ICD-10-CM | POA: Diagnosis present

## 2017-04-11 DIAGNOSIS — C770 Secondary and unspecified malignant neoplasm of lymph nodes of head, face and neck: Secondary | ICD-10-CM | POA: Diagnosis not present

## 2017-04-11 MED ORDER — HEPARIN SOD (PORK) LOCK FLUSH 100 UNIT/ML IV SOLN
500.0000 [IU] | Freq: Once | INTRAVENOUS | Status: AC | PRN
  Administered 2017-04-11: 500 [IU]
  Filled 2017-04-11: qty 5

## 2017-04-11 MED ORDER — SODIUM CHLORIDE 0.9 % IV SOLN
Freq: Once | INTRAVENOUS | Status: AC
  Administered 2017-04-11: 13:00:00 via INTRAVENOUS

## 2017-04-11 MED ORDER — ALUM & MAG HYDROXIDE-SIMETH 200-200-20 MG/5ML PO SUSP
30.0000 mL | Freq: Once | ORAL | Status: AC
  Administered 2017-04-11: 30 mL via ORAL
  Filled 2017-04-11: qty 30

## 2017-04-11 MED ORDER — SODIUM CHLORIDE 0.9 % IJ SOLN
10.0000 mL | INTRAMUSCULAR | Status: DC | PRN
  Administered 2017-04-11: 10 mL
  Filled 2017-04-11: qty 10

## 2017-04-11 NOTE — Progress Notes (Signed)
Nutrition Follow-up:  Nutrition follow-up completed in symptom management clinic while patient is receiving IV fluids. Patient being treated for supraglottis cancer and receiving chemotherapy.  Patient reports that he is taking 3 feedings of 2 cans every day (1 osmolite and 1 jevity) for total of 6 cans per day.  Patient reports increase in heartburn for the past 2 weeks. Reports formula getting backed up in the tube and coming out yesterday.  Reports food taking longer to drip in than before (20 minutes vs 10 minutes).  Also reports that stomach is sore around the insertion site.  Patient reports for 3-4 days will have a normal bowel movement but then will not go for 3-4 days then have bowel movement.  Patient reports this pattern is normal for him.  No more diarrhea per patient.   Patient reports that he continues to flush tube with water (1 syringe full before feeding and 2 syringe full after feeding). Also reports that he drinks water and gives additional flush via tube of water.    Patient reports he has appointment on 5/24 to see GI MD  Medications: adding mylanta per patient. Reports he is taking prilosec  Labs: reviewed  Anthropometrics:   Weight today 168 lb 12.8 oz decreased from weight of 171.8 on 4/4.  Overall stable weight 168-172 lb   NUTRITION DIAGNOSIS: Unintentional weight loss stable   INTERVENTION:   Reviewed proper feeding technique including sitting up during feeding and at least 45 minutes to 1 hour after feeding. Patient to follow-up with IR regarding feeding tube. Continue with current tube feeding regimen at this time    MONITORING, EVALUATION, GOAL: Patient will continue to tolerate tube feeding for weight maintenance/weight gain   NEXT VISIT: May 15 during infusion  Tennis Mckinnon B. Zenia Resides, [place], [place] Registered Dietitian [phone] (pager)

## 2017-04-11 NOTE — Patient Instructions (Signed)
Dehydration, Adult Dehydration is a condition in which there is not enough fluid or water in the body. This happens when you lose more fluids than you take in. Important organs, such as the kidneys, brain, and heart, cannot function without a proper amount of fluids. Any loss of fluids from the body can lead to dehydration. Dehydration can range from mild to severe. This condition should be treated right away to prevent it from becoming severe. What are the causes? This condition may be caused by:  Vomiting.  Diarrhea.  Excessive sweating, such as from heat exposure or exercise.  Not drinking enough fluid, especially:  When ill.  While doing activity that requires a lot of energy.  Excessive urination.  Fever.  Infection.  Certain medicines, such as medicines that cause the body to lose excess fluid (diuretics).  Inability to access safe drinking water.  Reduced physical ability to get adequate water and food. What increases the risk? This condition is more likely to develop in people:  Who have a poorly controlled long-term (chronic) illness, such as diabetes, heart disease, or kidney disease.  Who are age 65 or older.  Who are disabled.  Who live in a place with high altitude.  Who play endurance sports. What are the signs or symptoms? Symptoms of mild dehydration may include:   Thirst.  Dry lips.  Slightly dry mouth.  Dry, warm skin.  Dizziness. Symptoms of moderate dehydration may include:   Very dry mouth.  Muscle cramps.  Dark urine. Urine may be the color of tea.  Decreased urine production.  Decreased tear production.  Heartbeat that is irregular or faster than normal (palpitations).  Headache.  Light-headedness, especially when you stand up from a sitting position.  Fainting (syncope). Symptoms of severe dehydration may include:   Changes in skin, such as:  Cold and clammy skin.  Blotchy (mottled) or pale skin.  Skin that does  not quickly return to normal after being lightly pinched and released (poor skin turgor).  Changes in body fluids, such as:  Extreme thirst.  No tear production.  Inability to sweat when body temperature is high, such as in hot weather.  Very little urine production.  Changes in vital signs, such as:  Weak pulse.  Pulse that is more than 100 beats a minute when sitting still.  Rapid breathing.  Low blood pressure.  Other changes, such as:  Sunken eyes.  Cold hands and feet.  Confusion.  Lack of energy (lethargy).  Difficulty waking up from sleep.  Short-term weight loss.  Unconsciousness. How is this diagnosed? This condition is diagnosed based on your symptoms and a physical exam. Blood and urine tests may be done to help confirm the diagnosis. How is this treated? Treatment for this condition depends on the severity. Mild or moderate dehydration can often be treated at home. Treatment should be started right away. Do not wait until dehydration becomes severe. Severe dehydration is an emergency and it needs to be treated in a hospital. Treatment for mild dehydration may include:   Drinking more fluids.  Replacing salts and minerals in your blood (electrolytes) that you may have lost. Treatment for moderate dehydration may include:   Drinking an oral rehydration solution (ORS). This is a drink that helps you replace fluids and electrolytes (rehydrate). It can be found at pharmacies and retail stores. Treatment for severe dehydration may include:   Receiving fluids through an IV tube.  Receiving an electrolyte solution through a feeding tube that is   passed through your nose and into your stomach (nasogastric tube, or NG tube).  Correcting any abnormalities in electrolytes.  Treating the underlying cause of dehydration. Follow these instructions at home:  If directed by your health care provider, drink an ORS:  Make an ORS by following instructions on the  package.  Start by drinking small amounts, about  cup (120 mL) every 5-10 minutes.  Slowly increase how much you drink until you have taken the amount recommended by your health care provider.  Drink enough clear fluid to keep your urine clear or pale yellow. If you were told to drink an ORS, finish the ORS first, then start slowly drinking other clear fluids. Drink fluids such as:  Water. Do not drink only water. Doing that can lead to having too little salt (sodium) in the body (hyponatremia).  Ice chips.  Fruit juice that you have added water to (diluted fruit juice).  Low-calorie sports drinks.  Avoid:  Alcohol.  Drinks that contain a lot of sugar. These include high-calorie sports drinks, fruit juice that is not diluted, and soda.  Caffeine.  Foods that are greasy or contain a lot of fat or sugar.  Take over-the-counter and prescription medicines only as told by your health care provider.  Do not take sodium tablets. This can lead to having too much sodium in the body (hypernatremia).  Eat foods that contain a healthy balance of electrolytes, such as bananas, oranges, potatoes, tomatoes, and spinach.  Keep all follow-up visits as told by your health care provider. This is important. Contact a health care provider if:  You have abdominal pain that:  Gets worse.  Stays in one area (localizes).  You have a rash.  You have a stiff neck.  You are more irritable than usual.  You are sleepier or more difficult to wake up than usual.  You feel weak or dizzy.  You feel very thirsty.  You have urinated only a small amount of very dark urine over 6-8 hours. Get help right away if:  You have symptoms of severe dehydration.  You cannot drink fluids without vomiting.  Your symptoms get worse with treatment.  You have a fever.  You have a severe headache.  You have vomiting or diarrhea that:  Gets worse.  Does not go away.  You have blood or green matter  (bile) in your vomit.  You have blood in your stool. This may cause stool to look black and tarry.  You have not urinated in 6-8 hours.  You faint.  Your heart rate while sitting still is over 100 beats a minute.  You have trouble breathing. This information is not intended to replace advice given to you by your health care provider. Make sure you discuss any questions you have with your health care provider. Document Released: 11/28/2005 Document Revised: 06/24/2016 Document Reviewed: 01/22/2016 Elsevier Interactive Patient Education  2017 [employer]  

## 2017-04-12 ENCOUNTER — Ambulatory Visit (HOSPITAL_COMMUNITY)
Admission: RE | Admit: 2017-04-12 | Discharge: 2017-04-12 | Disposition: A | Discharge status: Home / Self Care | Payer: Medicaid Other | Source: Ambulatory Visit | Attending: Interventional Radiology | Admitting: Interventional Radiology

## 2017-04-12 ENCOUNTER — Ambulatory Visit (HOSPITAL_BASED_OUTPATIENT_CLINIC_OR_DEPARTMENT_OTHER): Payer: Medicaid Other

## 2017-04-12 ENCOUNTER — Other Ambulatory Visit (HOSPITAL_COMMUNITY): Payer: Self-pay | Admitting: Interventional Radiology

## 2017-04-12 VITALS — BP 123/78 | HR 80 | Temp 99.1°F | Resp 17 | Ht 70.0 in | Wt 168.4 lb

## 2017-04-12 DIAGNOSIS — R633 Feeding difficulties, unspecified: Secondary | ICD-10-CM

## 2017-04-12 DIAGNOSIS — C770 Secondary and unspecified malignant neoplasm of lymph nodes of head, face and neck: Secondary | ICD-10-CM | POA: Diagnosis not present

## 2017-04-12 DIAGNOSIS — C321 Malignant neoplasm of supraglottis: Secondary | ICD-10-CM

## 2017-04-12 HISTORY — PX: IR PATIENT EVAL TECH 0-60 MINS: IMG5564

## 2017-04-12 MED ORDER — SODIUM CHLORIDE 0.9 % IJ SOLN
10.0000 mL | INTRAMUSCULAR | Status: DC | PRN
  Administered 2017-04-12: 10 mL
  Filled 2017-04-12: qty 10

## 2017-04-12 MED ORDER — ALUM & MAG HYDROXIDE-SIMETH 200-200-20 MG/5ML PO SUSP
30.0000 mL | Freq: Once | ORAL | Status: AC
  Administered 2017-04-12: 30 mL via ORAL
  Filled 2017-04-12: qty 30

## 2017-04-12 MED ORDER — SODIUM CHLORIDE 0.9 % IV SOLN
Freq: Once | INTRAVENOUS | Status: AC
  Administered 2017-04-12: 13:00:00 via INTRAVENOUS

## 2017-04-12 MED ORDER — HEPARIN SOD (PORK) LOCK FLUSH 100 UNIT/ML IV SOLN
500.0000 [IU] | Freq: Once | INTRAVENOUS | Status: AC | PRN
  Administered 2017-04-12: 500 [IU]
  Filled 2017-04-12: qty 5

## 2017-04-12 NOTE — Progress Notes (Signed)
3:15 Notified IR that patient would be done within the next 15 minutes. Pt to have PEG tube evaluated-having increased residuals/discomfort

## 2017-04-12 NOTE — Procedures (Signed)
Patient came in today with complaint of pain around insertion site.  He felt the balloon may have  been pulled back in the track.  The site was evaluated with no issues.  The external disc was loosened and the catheter was pushed in and out with no pain.  The disc was repositioned at the previous position.  The patient was happy with the result.  He was advised to call us with any other problems.

## 2017-04-12 NOTE — Patient Instructions (Signed)
Dehydration, Adult Dehydration is a condition in which there is not enough fluid or water in the body. This happens when you lose more fluids than you take in. Important organs, such as the kidneys, brain, and heart, cannot function without a proper amount of fluids. Any loss of fluids from the body can lead to dehydration. Dehydration can range from mild to severe. This condition should be treated right away to prevent it from becoming severe. What are the causes? This condition may be caused by:  Vomiting.  Diarrhea.  Excessive sweating, such as from heat exposure or exercise.  Not drinking enough fluid, especially:  When ill.  While doing activity that requires a lot of energy.  Excessive urination.  Fever.  Infection.  Certain medicines, such as medicines that cause the body to lose excess fluid (diuretics).  Inability to access safe drinking water.  Reduced physical ability to get adequate water and food. What increases the risk? This condition is more likely to develop in people:  Who have a poorly controlled long-term (chronic) illness, such as diabetes, heart disease, or kidney disease.  Who are age 65 or older.  Who are disabled.  Who live in a place with high altitude.  Who play endurance sports. What are the signs or symptoms? Symptoms of mild dehydration may include:   Thirst.  Dry lips.  Slightly dry mouth.  Dry, warm skin.  Dizziness. Symptoms of moderate dehydration may include:   Very dry mouth.  Muscle cramps.  Dark urine. Urine may be the color of tea.  Decreased urine production.  Decreased tear production.  Heartbeat that is irregular or faster than normal (palpitations).  Headache.  Light-headedness, especially when you stand up from a sitting position.  Fainting (syncope). Symptoms of severe dehydration may include:   Changes in skin, such as:  Cold and clammy skin.  Blotchy (mottled) or pale skin.  Skin that does  not quickly return to normal after being lightly pinched and released (poor skin turgor).  Changes in body fluids, such as:  Extreme thirst.  No tear production.  Inability to sweat when body temperature is high, such as in hot weather.  Very little urine production.  Changes in vital signs, such as:  Weak pulse.  Pulse that is more than 100 beats a minute when sitting still.  Rapid breathing.  Low blood pressure.  Other changes, such as:  Sunken eyes.  Cold hands and feet.  Confusion.  Lack of energy (lethargy).  Difficulty waking up from sleep.  Short-term weight loss.  Unconsciousness. How is this diagnosed? This condition is diagnosed based on your symptoms and a physical exam. Blood and urine tests may be done to help confirm the diagnosis. How is this treated? Treatment for this condition depends on the severity. Mild or moderate dehydration can often be treated at home. Treatment should be started right away. Do not wait until dehydration becomes severe. Severe dehydration is an emergency and it needs to be treated in a hospital. Treatment for mild dehydration may include:   Drinking more fluids.  Replacing salts and minerals in your blood (electrolytes) that you may have lost. Treatment for moderate dehydration may include:   Drinking an oral rehydration solution (ORS). This is a drink that helps you replace fluids and electrolytes (rehydrate). It can be found at pharmacies and retail stores. Treatment for severe dehydration may include:   Receiving fluids through an IV tube.  Receiving an electrolyte solution through a feeding tube that is   passed through your nose and into your stomach (nasogastric tube, or NG tube).  Correcting any abnormalities in electrolytes.  Treating the underlying cause of dehydration. Follow these instructions at home:  If directed by your health care provider, drink an ORS:  Make an ORS by following instructions on the  package.  Start by drinking small amounts, about  cup (120 mL) every 5-10 minutes.  Slowly increase how much you drink until you have taken the amount recommended by your health care provider.  Drink enough clear fluid to keep your urine clear or pale yellow. If you were told to drink an ORS, finish the ORS first, then start slowly drinking other clear fluids. Drink fluids such as:  Water. Do not drink only water. Doing that can lead to having too little salt (sodium) in the body (hyponatremia).  Ice chips.  Fruit juice that you have added water to (diluted fruit juice).  Low-calorie sports drinks.  Avoid:  Alcohol.  Drinks that contain a lot of sugar. These include high-calorie sports drinks, fruit juice that is not diluted, and soda.  Caffeine.  Foods that are greasy or contain a lot of fat or sugar.  Take over-the-counter and prescription medicines only as told by your health care provider.  Do not take sodium tablets. This can lead to having too much sodium in the body (hypernatremia).  Eat foods that contain a healthy balance of electrolytes, such as bananas, oranges, potatoes, tomatoes, and spinach.  Keep all follow-up visits as told by your health care provider. This is important. Contact a health care provider if:  You have abdominal pain that:  Gets worse.  Stays in one area (localizes).  You have a rash.  You have a stiff neck.  You are more irritable than usual.  You are sleepier or more difficult to wake up than usual.  You feel weak or dizzy.  You feel very thirsty.  You have urinated only a small amount of very dark urine over 6-8 hours. Get help right away if:  You have symptoms of severe dehydration.  You cannot drink fluids without vomiting.  Your symptoms get worse with treatment.  You have a fever.  You have a severe headache.  You have vomiting or diarrhea that:  Gets worse.  Does not go away.  You have blood or green matter  (bile) in your vomit.  You have blood in your stool. This may cause stool to look black and tarry.  You have not urinated in 6-8 hours.  You faint.  Your heart rate while sitting still is over 100 beats a minute.  You have trouble breathing. This information is not intended to replace advice given to you by your health care provider. Make sure you discuss any questions you have with your health care provider. Document Released: 11/28/2005 Document Revised: 06/24/2016 Document Reviewed: 01/22/2016 Elsevier Interactive Patient Education  2017 [employer]  

## 2017-04-14 ENCOUNTER — Telehealth: Payer: Self-pay | Admitting: *Deleted

## 2017-04-14 MED ORDER — FUROSEMIDE 20 MG PO TABS: 20.0000 mg | ORAL_TABLET | Freq: Every day | ORAL | 0 refills | Status: DC

## 2017-04-14 MED FILL — FUROSEMIDE 20 MG TABLET: 20 | 5 days supply | Qty: 5 | Fill #0

## 2017-04-14 NOTE — Telephone Encounter (Signed)
It's probably from all the IVF he has received Recommend leg elevation and low salt intake Call in 20 mg PO lasix; take daily for 5 days Take his pain medications as directed for pain

## 2017-04-14 NOTE — Telephone Encounter (Signed)
Notified of message below. Verbalized understanding 

## 2017-04-14 NOTE — Telephone Encounter (Signed)
Kyle Alexander reports that his feet are swollen up to his ankles, numb, cannot get shoes on, pain ~ 8. States he is not worried about it, but this has not happened before. Going out of town this weekend.

## 2017-04-18 ENCOUNTER — Encounter (HOSPITAL_COMMUNITY): Payer: Self-pay | Admitting: Radiology

## 2017-04-24 ENCOUNTER — Ambulatory Visit: Payer: Medicaid Other

## 2017-04-24 ENCOUNTER — Telehealth: Payer: Self-pay

## 2017-04-24 ENCOUNTER — Telehealth: Payer: Self-pay | Admitting: Hematology and Oncology

## 2017-04-24 ENCOUNTER — Other Ambulatory Visit (HOSPITAL_BASED_OUTPATIENT_CLINIC_OR_DEPARTMENT_OTHER): Payer: Medicaid Other

## 2017-04-24 ENCOUNTER — Ambulatory Visit (HOSPITAL_BASED_OUTPATIENT_CLINIC_OR_DEPARTMENT_OTHER): Payer: Medicaid Other

## 2017-04-24 ENCOUNTER — Ambulatory Visit (HOSPITAL_BASED_OUTPATIENT_CLINIC_OR_DEPARTMENT_OTHER): Payer: Medicaid Other | Admitting: Hematology and Oncology

## 2017-04-24 DIAGNOSIS — C321 Malignant neoplasm of supraglottis: Secondary | ICD-10-CM

## 2017-04-24 DIAGNOSIS — T451X5A Adverse effect of antineoplastic and immunosuppressive drugs, initial encounter: Secondary | ICD-10-CM | POA: Diagnosis not present

## 2017-04-24 DIAGNOSIS — R600 Localized edema: Secondary | ICD-10-CM | POA: Diagnosis not present

## 2017-04-24 DIAGNOSIS — G620 Drug-induced polyneuropathy: Secondary | ICD-10-CM

## 2017-04-24 DIAGNOSIS — E43 Unspecified severe protein-calorie malnutrition: Secondary | ICD-10-CM | POA: Diagnosis not present

## 2017-04-24 DIAGNOSIS — Z5111 Encounter for antineoplastic chemotherapy: Secondary | ICD-10-CM | POA: Diagnosis present

## 2017-04-24 DIAGNOSIS — L0202 Furuncle of face: Secondary | ICD-10-CM | POA: Diagnosis not present

## 2017-04-24 DIAGNOSIS — D6481 Anemia due to antineoplastic chemotherapy: Secondary | ICD-10-CM | POA: Diagnosis not present

## 2017-04-24 LAB — COMPREHENSIVE METABOLIC PANEL
ALBUMIN: 3.8 g/dL (ref 3.5–5.0)
ALK PHOS: 54 U/L (ref 40–150)
ALT: 14 U/L (ref 0–55)
ANION GAP: 9 meq/L (ref 3–11)
AST: 14 U/L (ref 5–34)
BILIRUBIN TOTAL: 0.36 mg/dL (ref 0.20–1.20)
BUN: 12.8 mg/dL (ref 7.0–26.0)
CO2: 28 mEq/L (ref 22–29)
CREATININE: 0.8 mg/dL (ref 0.7–1.3)
Calcium: 9.8 mg/dL (ref 8.4–10.4)
Chloride: 104 mEq/L (ref 98–109)
GLUCOSE: 121 mg/dL (ref 70–140)
Potassium: 3.8 mEq/L (ref 3.5–5.1)
Sodium: 141 mEq/L (ref 136–145)
TOTAL PROTEIN: 6.9 g/dL (ref 6.4–8.3)

## 2017-04-24 LAB — CBC WITH DIFFERENTIAL/PLATELET
BASO%: 0.1 % (ref 0.0–2.0)
BASOS ABS: 0 10*3/uL (ref 0.0–0.1)
EOS ABS: 0.1 10*3/uL (ref 0.0–0.5)
EOS%: 0.9 % (ref 0.0–7.0)
HCT: 36.5 % — ABNORMAL LOW (ref 38.4–49.9)
HEMOGLOBIN: 12.1 g/dL — AB (ref 13.0–17.1)
LYMPH#: 0.7 10*3/uL — AB (ref 0.9–3.3)
LYMPH%: 8.6 % — ABNORMAL LOW (ref 14.0–49.0)
MCH: 33.6 pg — ABNORMAL HIGH (ref 27.2–33.4)
MCHC: 33.2 g/dL (ref 32.0–36.0)
MCV: 101.4 fL — AB (ref 79.3–98.0)
MONO#: 0.7 10*3/uL (ref 0.1–0.9)
MONO%: 7.6 % (ref 0.0–14.0)
NEUT%: 82.8 % — ABNORMAL HIGH (ref 39.0–75.0)
NEUTROS ABS: 7.2 10*3/uL — AB (ref 1.5–6.5)
NRBC: 0 % (ref 0–0)
PLATELETS: 198 10*3/uL (ref 140–400)
RBC: 3.6 10*6/uL — ABNORMAL LOW (ref 4.20–5.82)
RDW: 15.8 % — ABNORMAL HIGH (ref 11.0–14.6)
WBC: 8.6 10*3/uL (ref 4.0–10.3)

## 2017-04-24 MED ORDER — SODIUM CHLORIDE 0.9 % IV SOLN
Freq: Once | INTRAVENOUS | Status: AC
  Administered 2017-04-24: 15:00:00 via INTRAVENOUS
  Filled 2017-04-24: qty 5

## 2017-04-24 MED ORDER — HYDROMORPHONE HCL 4 MG/ML IJ SOLN
INTRAMUSCULAR | Status: AC
  Filled 2017-04-24: qty 1

## 2017-04-24 MED ORDER — DEXTROSE 5 % IV SOLN
80.0000 mg/m2 | Freq: Once | INTRAVENOUS | Status: AC
  Administered 2017-04-24: 156 mg via INTRAVENOUS
  Filled 2017-04-24: qty 26

## 2017-04-24 MED ORDER — SODIUM CHLORIDE 0.9% FLUSH
10.0000 mL | INTRAVENOUS | Status: DC | PRN
  Administered 2017-04-24: 10 mL
  Filled 2017-04-24: qty 10

## 2017-04-24 MED ORDER — HEPARIN SOD (PORK) LOCK FLUSH 100 UNIT/ML IV SOLN
500.0000 [IU] | Freq: Once | INTRAVENOUS | Status: AC | PRN
  Administered 2017-04-24: 500 [IU]
  Filled 2017-04-24: qty 5

## 2017-04-24 MED ORDER — SODIUM CHLORIDE 0.9 % IV SOLN
Freq: Once | INTRAVENOUS | Status: AC
  Administered 2017-04-24: 15:00:00 via INTRAVENOUS

## 2017-04-24 MED ORDER — HYDROMORPHONE HCL 4 MG/ML IJ SOLN
2.0000 mg | Freq: Once | INTRAMUSCULAR | Status: AC
  Administered 2017-04-24: 2 mg via INTRAVENOUS

## 2017-04-24 MED ORDER — DIPHENHYDRAMINE HCL 50 MG/ML IJ SOLN
INTRAMUSCULAR | Status: AC
  Filled 2017-04-24: qty 1

## 2017-04-24 MED ORDER — DIPHENHYDRAMINE HCL 50 MG/ML IJ SOLN
50.0000 mg | Freq: Once | INTRAMUSCULAR | Status: AC
  Administered 2017-04-24: 50 mg via INTRAVENOUS

## 2017-04-24 MED ORDER — FAMOTIDINE IN NACL 20-0.9 MG/50ML-% IV SOLN
20.0000 mg | Freq: Once | INTRAVENOUS | Status: AC
  Administered 2017-04-24: 20 mg via INTRAVENOUS

## 2017-04-24 MED ORDER — FAMOTIDINE IN NACL 20-0.9 MG/50ML-% IV SOLN
INTRAVENOUS | Status: AC
  Filled 2017-04-24: qty 50

## 2017-04-24 MED ORDER — PALONOSETRON HCL INJECTION 0.25 MG/5ML
0.2500 mg | Freq: Once | INTRAVENOUS | Status: AC
  Administered 2017-04-24: 0.25 mg via INTRAVENOUS

## 2017-04-24 MED ORDER — PALONOSETRON HCL INJECTION 0.25 MG/5ML
INTRAVENOUS | Status: AC
Start: 2017-04-24 — End: 2017-04-24
  Filled 2017-04-24: qty 5

## 2017-04-24 NOTE — Telephone Encounter (Signed)
Gave patient AVS and calender per 5/14 los  

## 2017-04-24 NOTE — Telephone Encounter (Signed)
Called appt scheduler at Dr. Noreene Filbert office. Patient needs appt to see Dr. [name].  Per patient spitting more, feels like the trach is gagging him and the trach feels like it is choking him. They will call patient and schedule appt.

## 2017-04-24 NOTE — Patient Instructions (Signed)
[facility] Discharge Instructions for Patients Receiving Chemotherapy  Today you received the following chemotherapy agents taxol  To help prevent nausea and vomiting after your treatment, we encourage you to take your nausea medication as directed   If you develop nausea and vomiting that is not controlled by your nausea medication, call the clinic.   BELOW ARE SYMPTOMS THAT SHOULD BE REPORTED IMMEDIATELY:  *FEVER GREATER THAN 100.5 F  *CHILLS WITH OR WITHOUT FEVER  NAUSEA AND VOMITING THAT IS NOT CONTROLLED WITH YOUR NAUSEA MEDICATION  *UNUSUAL SHORTNESS OF BREATH  *UNUSUAL BRUISING OR BLEEDING  TENDERNESS IN MOUTH AND THROAT WITH OR WITHOUT PRESENCE OF ULCERS  *URINARY PROBLEMS  *BOWEL PROBLEMS  UNUSUAL RASH Items with * indicate a potential emergency and should be followed up as soon as possible.  Feel free to call the clinic you have any questions or concerns. The clinic phone number is [phone].  

## 2017-04-24 NOTE — Patient Instructions (Signed)

## 2017-04-25 ENCOUNTER — Other Ambulatory Visit: Payer: Self-pay | Admitting: Hematology and Oncology

## 2017-04-25 ENCOUNTER — Ambulatory Visit (HOSPITAL_BASED_OUTPATIENT_CLINIC_OR_DEPARTMENT_OTHER): Payer: Medicaid Other

## 2017-04-25 ENCOUNTER — Ambulatory Visit: Payer: Medicaid Other

## 2017-04-25 ENCOUNTER — Telehealth: Payer: Self-pay | Admitting: Hematology and Oncology

## 2017-04-25 ENCOUNTER — Encounter: Payer: Self-pay | Admitting: Hematology and Oncology

## 2017-04-25 VITALS — BP 116/71 | HR 71 | Temp 98.2°F | Resp 18 | Ht 70.0 in

## 2017-04-25 DIAGNOSIS — R600 Localized edema: Secondary | ICD-10-CM | POA: Insufficient documentation

## 2017-04-25 DIAGNOSIS — C321 Malignant neoplasm of supraglottis: Secondary | ICD-10-CM

## 2017-04-25 DIAGNOSIS — R112 Nausea with vomiting, unspecified: Secondary | ICD-10-CM

## 2017-04-25 DIAGNOSIS — L0202 Furuncle of face: Secondary | ICD-10-CM | POA: Insufficient documentation

## 2017-04-25 MED ORDER — ALUM & MAG HYDROXIDE-SIMETH 200-200-20 MG/5ML PO SUSP
30.0000 mL | Freq: Once | ORAL | Status: AC
Start: 2017-04-25 — End: 2017-04-25
  Administered 2017-04-25: 30 mL via ORAL
  Filled 2017-04-25: qty 30

## 2017-04-25 MED ORDER — HEPARIN SOD (PORK) LOCK FLUSH 100 UNIT/ML IV SOLN
500.0000 [IU] | Freq: Once | INTRAVENOUS | Status: AC | PRN
  Administered 2017-04-25: 500 [IU]
  Filled 2017-04-25: qty 5

## 2017-04-25 MED ORDER — SODIUM CHLORIDE 0.9 % IJ SOLN
10.0000 mL | INTRAMUSCULAR | Status: DC | PRN
  Administered 2017-04-25: 10 mL
  Filled 2017-04-25: qty 10

## 2017-04-25 MED ORDER — SODIUM CHLORIDE 0.9 % IV SOLN
Freq: Once | INTRAVENOUS | Status: AC
  Administered 2017-04-25: 14:00:00 via INTRAVENOUS

## 2017-04-25 NOTE — Assessment & Plan Note (Signed)
He has bilateral leg edema due to fluid retention. We discussed discontinuation of IV fluids but the patient declined

## 2017-04-25 NOTE — Assessment & Plan Note (Signed)
The patient continues to have severe nausea and vomiting. With aggressive IV fluid hydration, his nausea is under control He was referred to see GI for further evaluation and in the meantime he will continue Maalox as needed

## 2017-04-25 NOTE — Assessment & Plan Note (Signed)
He has a boil on his face. With the patient request, I have expressed a significant amount of pus.  At the end of the procedure, the area of face swelling has dramatically reduced I do not recommend antibiotic treatment.  Recommend dry dressing/Band-Aid only

## 2017-04-25 NOTE — Progress Notes (Signed)
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[place])   03/15/2015 - 03/17/2015 Hospital Admission    He was admitted to the hospital for evaluation of dysphagia, SOB, hemoptosis, hoarseness, 30-40 pound weight loss and worsening bilateral neck masses for 5 months      03/15/2015 Imaging    Ct showed extensive circumferential malignancy in the hypopharyngeal/supraglottic region with regional LN metastases      03/16/2015 Procedure    He underwent ULTRASOUND-GUIDED BIOPSY OF LEFT CERVICAL LYMPH NODES      03/16/2015 Pathology Results    Accession: [id number] LN biopsy showed invasive squamous cell cancer      03/16/2015 Pathology Results    Accession: [id number] showed atypical squamous cells      03/25/2015 - 04/07/2015 Hospital Admission    He was admitted to the hospital and underwent tracheostomy placement, feeeding tube placement but subsequently left [facility]      03/26/2015 Surgery    He had multiple extraction of tooth numbers 1, 2, 5, 6, 7, 8, 9, 10, 11, 12, 13, 18, 19, 21, 22, 23, 24, 25, 26, 27, 28, and 29. and 4 Quadrants of alveoloplasty      03/26/2015 Surgery    He underwent tracheostomy      03/31/2015 Surgery    He had open gastrostomy tube placement by Dr. Donne Hazel      04/16/2015 - 05/18/2015 Radiation Therapy    Laryngopharynx and bilateral neck / 50 Gy in 20 fractions to gross disease, 45 Gy in 20 fractions to high risk nodal echelons  Beams/energy: Helical IMRT / 6 MV photons      04/16/2015 Procedure    Fluoroscopic reposition of the 18 French gastrostomy confirmed back in the stomach,      07/03/2015 Procedure    IR  performed replacement of gastrostomy tube with a new 64 French balloon retention tube      10/01/2015 Imaging    PEt scan showed persistent hypermetabolism within the primary supraglottic laryngeal tumor and within right retropharyngeal, bilateral level II and left level IV cervical nodal metastases      10/28/2015 Procedure    He had placement of PICC line. The IR was not able to place PORT due to suspected upper respiratory infection      11/13/2015 - 03/21/2016 Chemotherapy    He received 5FU, carboplatin chemo with weekly Erbitux      11/19/2015 Surgery    Gastrostomy tube replaced.      11/30/2015 Procedure    Placement of right jugular port-a-cath.      11/30/2015 Procedure    PICC removed.      12/30/2015 Imaging    PET CT showed positive response to Rx      02/26/2016 Procedure    He underwent direct laryngoscopy with biopsy. Esophageal dilatation.       02/26/2016 Pathology Results    Repeat biopsy of supraglottis showed persistent disease      02/29/2016 Procedure    Gastrostomy tube exchanged.      03/28/2016 PET scan    Hypermetabolic tissue in the posterior RIGHT hypopharynx is similar in pattern to PET-CT of 12/30/2015 but increased in metabolic activity.2. Hypermetabolic tissue /  lymph nodes in the LEFT supraclavicular nodal station are in a similar pattern       04/04/2016 - 11/29/2016 Chemotherapy    He started on palliative chemotherapy with pembrolizumab      05/03/2016 Imaging    MRI head is negative      06/02/2016 Imaging    Mild improvement of diffuse pharyngeal and supraglottic edema.Increased asymmetry of right-sided hypopharyngeal/supraglottic softtissue compared to the prior neck CT with FDG uptake in this region on prior PET-CT. Residual/recurrent tumor is possible      08/09/2016 Procedure    IR placed new 20 French percutaneous gastrostomy tube.      08/26/2016 Imaging    Ct neck showed unchanged diffuse pharyngeal and supraglottic  edema as well as asymmetric soft tissue thickening on the right. Slightly decreased size of some left level II lymph nodes. Unchanged lymphadenopathy elsewhere in the neck. Decreased size of thyroid mass. No evidence of metastatic cancer to the chest      12/13/2016 Imaging    Ct chest showed no evidence of thoracic metastatic disease or primary thoracic malignancy.      12/13/2016 Imaging    Ct neck showed progression of of RIGHT supraglottic mass compared with priors, approximate size 24 x 27 x 27 mm. Extension caudally along the RIGHT area epiglottic fold with increasing mass effect on the airway. Tracheostomy satisfactory position. Stable to slightly improved malignant adenopathy.      12/26/2016 -  Chemotherapy    He received chemotherapy with weekly Taxol       02/10/2017 Imaging    Very similar appearance to the study of January. Right sided supraglottic to glottic mass is similar, perhaps a few mm smaller. Bilateral enlarged nodes appear the same. No progressive finding      02/10/2017 Imaging    No evidence of metastatic disease in the abdomen/pelvis. Percutaneous gastrostomy in satisfactory position. Cholelithiasis, without associated inflammatory changes.       INTERVAL HISTORY: Please see below for problem oriented charting. He returns for further chemotherapy He complained of discharge/pus from the face boil He complained of leg edema.  On his week of treatment, the leg swelling has improved His pain appears to be well controlled He continued to complain of nausea and reflux symptoms GI service consult is pending He denies worsening peripheral neuropathy  REVIEW OF SYSTEMS:   Constitutional: Denies fevers, chills or abnormal weight loss Eyes: Denies blurriness of vision Ears, nose, mouth, throat, and face: Denies mucositis or sore throat Respiratory: Denies cough, dyspnea or wheezes Cardiovascular: Denies palpitation, chest discomfort  Lymphatics: Denies new  lymphadenopathy or easy bruising Neurological:Denies numbness, tingling or new weaknesses Behavioral/Psych: Mood is stable, no new changes  All other systems were reviewed with the patient and are negative.  I have reviewed the past medical history, past surgical history, social history and family history with the patient and they are unchanged from previous note.  ALLERGIES:  is allergic to aspirin.  MEDICATIONS:  Current Outpatient Prescriptions  Medication Sig Dispense Refill  . fluticasone (FLONASE) 50 MCG/ACT nasal spray Place 2 sprays into both nostrils daily. 16 g 2  . guaiFENesin (ROBITUSSIN) 100 MG/5ML SOLN Take 5 mLs (100 mg total) by mouth every 4 (four) hours as needed for cough or to loosen phlegm. 473 mL 3  . levothyroxine (SYNTHROID) 100 MCG tablet Take 1 tablet (100 mcg total) by mouth daily before breakfast. 30 tablet 9  . lidocaine (XYLOCAINE) 2 % solution Use as directed 15 mLs  in the mouth or throat every 8 (eight) hours as needed for mouth pain. 100 mL 0  . morphine (MS CONTIN) 60 MG 12 hr tablet Take 1 tablet (60 mg total) by mouth every 12 (twelve) hours. 60 tablet 0  . morphine (MSIR) 30 MG tablet Take 2 tablets (60 mg total) by mouth every 4 (four) hours as needed for severe pain. 90 tablet 0  . Nutritional Supplements (FEEDING SUPPLEMENT, JEVITY 1.5 CAL/FIBER,) LIQD Give 1 can Osmolite 1.2 + 1 can of Jevity 1.5 QID via PEG with 60 cc free water before and after bolus. Flush with 240 cc free water BID between feedings. 948 mL   . omeprazole (PRILOSEC) 20 MG capsule Take 1 capsule (20 mg total) by mouth 2 (two) times daily. 60 capsule 0  . ondansetron (ZOFRAN) 8 MG tablet Take 1 tablet (8 mg total) by mouth every 8 (eight) hours as needed for nausea. 60 tablet 3  . sodium chloride irrigation 0.9 % irrigation USE TO CLEAN AROUND TRACH AND PERFORM TRACH CARE ONCE DAILY AND AS NEEDED 1000 mL 11  . triamcinolone (NASACORT AQ) 55 MCG/ACT AERO nasal inhaler Place 2 sprays into  the nose daily. 1 Inhaler 12  . lactulose (CHRONULAC) 10 GM/15ML solution Take 15 mLs (10 g total) by mouth 3 (three) times daily. (Patient not taking: Reported on 02/13/2017) 473 mL 2  . loperamide (IMODIUM) 2 MG capsule Take 1 capsule (2 mg total) by mouth as needed for diarrhea or loose stools. (Patient not taking: Reported on 03/14/2017) 30 capsule 1  . LORazepam (ATIVAN) 1 MG tablet Take 1 tablet (1 mg total) by mouth 2 (two) times daily as needed for anxiety (or nausea). (Patient not taking: Reported on 02/13/2017) 10 tablet 0  . polyethylene glycol (MIRALAX) packet Take 17 g by mouth daily. (Patient not taking: Reported on 01/09/2017) 14 each 0   Current Facility-Administered Medications  Medication Dose Route Frequency Provider Last Rate Last Dose  . guaiFENesin-dextromethorphan (ROBITUSSIN DM) 100-10 MG/5ML syrup 10 mL  10 mL Oral Q4H PRN Alvy Bimler, Naoma Boxell, MD   10 mL at 01/09/17 1427   Facility-Administered Medications Ordered in Other Visits  Medication Dose Route Frequency Provider Last Rate Last Dose  . heparin lock flush 100 unit/mL  500 Units Intracatheter Once PRN Alvy Bimler, Gladis Soley, MD      . HYDROmorphone (DILAUDID) injection 2 mg  2 mg Intravenous Q2H PRN Alvy Bimler, Kenyatte Gruber, MD   2 mg at 12/26/16 1440  . sodium chloride 0.9 % injection 10 mL  10 mL Intracatheter PRN [name], MD   10 mL at 01/02/17 1107  . sodium chloride 0.9 % injection 10 mL  10 mL Intracatheter PRN Alvy Bimler, Kerissa Coia, MD      . sodium chloride flush (NS) 0.9 % injection 10 mL  10 mL Intracatheter PRN Alvy Bimler, Reily Ilic, MD   10 mL at 12/26/16 1708    PHYSICAL EXAMINATION: ECOG PERFORMANCE STATUS: 1 - Symptomatic but completely ambulatory  Vitals:   04/24/17 1328  BP: 111/72  Pulse: 75  Resp: 18  Temp: 99.1 F (37.3 C)   Filed Weights   04/24/17 1328  Weight: 171 lb 1.6 oz (77.6 kg)    GENERAL:alert, no distress and comfortable SKIN: He has infected boil on the right side of his face.  Significant amount of pus is expressed and a  clean bandage is placed EYES: normal, Conjunctiva are pink and non-injected, sclera clear OROPHARYNX:no exudate, no erythema and lips, buccal mucosa, and tongue normal  NECK: Tracheostomy site looks okay  LYMPH:  no palpable lymphadenopathy in the cervical, axillary or inguinal LUNGS: clear to auscultation and percussion with normal breathing effort HEART: regular rate & rhythm and no murmurs and no lower extremity edema ABDOMEN:abdomen soft, non-tender and normal bowel sounds Musculoskeletal:no cyanosis of digits and no clubbing  NEURO: alert & oriented x 3 with fluent speech, no focal motor/sensory deficits  LABORATORY DATA:  I have reviewed the data as listed    Component Value Date/Time   NA 141 04/24/2017 1213   K 3.8 04/24/2017 1213   CL 99 (L) 12/27/2016 1748   CO2 28 04/24/2017 1213   GLUCOSE 121 04/24/2017 1213   BUN 12.8 04/24/2017 1213   CREATININE 0.8 04/24/2017 1213   CALCIUM 9.8 04/24/2017 1213   PROT 6.9 04/24/2017 1213   ALBUMIN 3.8 04/24/2017 1213   AST 14 04/24/2017 1213   ALT 14 04/24/2017 1213   ALKPHOS 54 04/24/2017 1213   BILITOT 0.36 04/24/2017 1213   GFRNONAA >60 12/27/2016 1748   GFRAA >60 12/27/2016 1748    No results found for: SPEP, UPEP  Lab Results  Component Value Date   WBC 8.6 04/24/2017   NEUTROABS 7.2 (H) 04/24/2017   HGB 12.1 (L) 04/24/2017   HCT 36.5 (L) 04/24/2017   MCV 101.4 (H) 04/24/2017   PLT 198 04/24/2017      Chemistry      Component Value Date/Time   NA 141 04/24/2017 1213   K 3.8 04/24/2017 1213   CL 99 (L) 12/27/2016 1748   CO2 28 04/24/2017 1213   BUN 12.8 04/24/2017 1213   CREATININE 0.8 04/24/2017 1213      Component Value Date/Time   CALCIUM 9.8 04/24/2017 1213   ALKPHOS 54 04/24/2017 1213   AST 14 04/24/2017 1213   ALT 14 04/24/2017 1213   BILITOT 0.36 04/24/2017 1213       ASSESSMENT & PLAN:  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 The patient denies recent history of bleeding such as epistaxis, hematuria or hematochezia. He is asymptomatic from the anemia. I will observe for now.  He does not require transfusion now. I will continue the chemotherapy at current dose without dosage adjustment.  If the anemia gets progressive worse in the future, I might have to delay his treatment or adjust the chemotherapy dose.   Peripheral neuropathy due to chemotherapy [facility]) he has mild peripheral neuropathy, likely related to side effects of treatment. It is only mild, not bothering the patient. I will observe for now If it gets worse in the future, I will consider modifying the dose of the treatment   Protein-calorie malnutrition, severe ([place]) The patient continues to have severe nausea and vomiting. With aggressive IV fluid hydration, his nausea is under control He was referred to see GI for further evaluation and in the meantime he will continue Maalox as needed   Bilateral leg edema He has bilateral leg edema due to fluid retention. We discussed discontinuation of IV fluids but the patient declined  Boil, face He has a boil on his face. With the  patient request, I have expressed a significant amount of pus.  At the end of the procedure, the area of face swelling has dramatically reduced I do not recommend antibiotic treatment.  Recommend dry dressing/Band-Aid only   No orders of the defined types were placed in this encounter.  All questions were answered. The patient knows to call the clinic with any problems, questions or concerns. No barriers to learning was detected. I spent 25 minutes  counseling the patient face to face. The total time spent in the appointment was 40 minutes and more than 50% was on counseling and review of test results     [name], MD 04/25/2017 2:10 PM

## 2017-04-25 NOTE — Assessment & Plan Note (Signed)
He tolerated treatment well except for nausea We will proceed with treatment without dose adjustment, on days 1 and 8 and rest day 15, for cycle of every 21 days I will see him on day 1 of every cycle He will continue treatment without dose adjustment His next imaging study would be due in June

## 2017-04-25 NOTE — Assessment & Plan Note (Signed)
This is likely due to recent treatment. The patient denies recent history of bleeding such as epistaxis, hematuria or hematochezia. He is asymptomatic from the anemia. I will observe for now.  He does not require transfusion now. I will continue the chemotherapy at current dose without dosage adjustment.  If the anemia gets progressive worse in the future, I might have to delay his treatment or adjust the chemotherapy dose.  

## 2017-04-25 NOTE — Patient Instructions (Signed)
Dehydration, Adult Dehydration is when there is not enough fluid or water in your body. This happens when you lose more fluids than you take in. Dehydration can range from mild to very bad. It should be treated right away to keep it from getting very bad. Symptoms of mild dehydration may include:   Thirst.  Dry lips.  Slightly dry mouth.  Dry, warm skin.  Dizziness. Symptoms of moderate dehydration may include:   Very dry mouth.  Muscle cramps.  Dark pee (urine). Pee may be the color of tea.  Your body making less pee.  Your eyes making fewer tears.  Heartbeat that is uneven or faster than normal (palpitations).  Headache.  Light-headedness, especially when you stand up from sitting.  Fainting (syncope). Symptoms of very bad dehydration may include:   Changes in skin, such as:  Cold and clammy skin.  Blotchy (mottled) or pale skin.  Skin that does not quickly return to normal after being lightly pinched and let go (poor skin turgor).  Changes in body fluids, such as:  Feeling very thirsty.  Your eyes making fewer tears.  Not sweating when body temperature is high, such as in hot weather.  Your body making very little pee.  Changes in vital signs, such as:  Weak pulse.  Pulse that is more than 100 beats a minute when you are sitting still.  Fast breathing.  Low blood pressure.  Other changes, such as:  Sunken eyes.  Cold hands and feet.  Confusion.  Lack of energy (lethargy).  Trouble waking up from sleep.  Short-term weight loss.  Unconsciousness. Follow these instructions at home:  If told by your doctor, drink an ORS:  Make an ORS by using instructions on the package.  Start by drinking small amounts, about  cup (120 mL) every 5-10 minutes.  Slowly drink more until you have had the amount that your doctor said to have.  Drink enough clear fluid to keep your pee clear or pale yellow. If you were told to drink an ORS, finish the  ORS first, then start slowly drinking clear fluids. Drink fluids such as:  Water. Do not drink only water by itself. Doing that can make the salt (sodium) level in your body get too low (hyponatremia).  Ice chips.  Fruit juice that you have added water to (diluted).  Low-calorie sports drinks.  Avoid:  Alcohol.  Drinks that have a lot of sugar. These include high-calorie sports drinks, fruit juice that does not have water added, and soda.  Caffeine.  Foods that are greasy or have a lot of fat or sugar.  Take over-the-counter and prescription medicines only as told by your doctor.  Do not take salt tablets. Doing that can make the salt level in your body get too high (hypernatremia).  Eat foods that have minerals (electrolytes). Examples include bananas, oranges, potatoes, tomatoes, and spinach.  Keep all follow-up visits as told by your doctor. This is important. Contact a doctor if:  You have belly (abdominal) pain that:  Gets worse.  Stays in one area (localizes).  You have a rash.  You have a stiff neck.  You get angry or annoyed more easily than normal (irritability).  You are more sleepy than normal.  You have a harder time waking up than normal.  You feel:  Weak.  Dizzy.  Very thirsty.  You have peed (urinated) only a small amount of very dark pee during 6-8 hours. Get help right away if:  You   have symptoms of very bad dehydration.  You cannot drink fluids without throwing up (vomiting).  Your symptoms get worse with treatment.  You have a fever.  You have a very bad headache.  You are throwing up or having watery poop (diarrhea) and it:  Gets worse.  Does not go away.  You have blood or something green (bile) in your throw-up.  You have blood in your poop (stool). This may cause poop to look black and tarry.  You have not peed in 6-8 hours.  You pass out (faint).  Your heart rate when you are sitting still is more than 100 beats a  minute.  You have trouble breathing. This information is not intended to replace advice given to you by your health care provider. Make sure you discuss any questions you have with your health care provider. Document Released: 09/24/2009 Document Revised: 06/17/2016 Document Reviewed: 01/22/2016 Elsevier Interactive Patient Education  2017 [employer]vier Inc.  

## 2017-04-25 NOTE — Telephone Encounter (Signed)
Moved patient to Symptom manage per Bertis Ruddy  For 5/16. For availability in the treatment area.

## 2017-04-25 NOTE — Progress Notes (Signed)
Nutrition Follow-up:  Nutrition follow-up completed in infusion suite.  Patient to receive IV fluids.  Patient being treated for supraglottis cancer and receiving chemotherapy.  Patient reports that he had very small amount of emesis this am about 10am and nausea but resolved.  Patient continues to complain of heartburn, to see GI on 5/24.   Noted IR evaluated feeding tube on 5/2.    Patient continues with tube feeding regimen of 3 feedings of 2 cans every day (osmolite and jevity 1.5) for total of 6 cans per day.    No other nutrition related symptoms reported today.     Medications: reviewed  Labs: reviewed  Anthropometrics:   Noted weight of 171 lb 1.6 oz on 5/14, increased from 168 lb 12.8 oz on 5/1.    NUTRITION DIAGNOSIS: Unintentional weight loss stable    INTERVENTION:   Continue with current tube feeding regimen at this time.     MONITORING, EVALUATION, GOAL: Patient will continue to tolerate tube feeding for weight maintenance/weight gain.   NEXT VISIT: June 4th during infusion  Rossie Scarfone B. Zenia Resides, [place], [place] Registered Dietitian [phone] (pager)

## 2017-04-25 NOTE — Assessment & Plan Note (Signed)
he has mild peripheral neuropathy, likely related to side effects of treatment. It is only mild, not bothering the patient. I will observe for now If it gets worse in the future, I will consider modifying the dose of the treatment  

## 2017-04-26 ENCOUNTER — Other Ambulatory Visit: Payer: Self-pay | Admitting: *Deleted

## 2017-04-26 ENCOUNTER — Other Ambulatory Visit: Payer: Self-pay | Admitting: Hematology and Oncology

## 2017-04-26 ENCOUNTER — Ambulatory Visit (HOSPITAL_BASED_OUTPATIENT_CLINIC_OR_DEPARTMENT_OTHER): Payer: Medicaid Other

## 2017-04-26 VITALS — BP 135/85 | HR 83 | Temp 98.0°F | Resp 18 | Ht 70.0 in | Wt 172.4 lb

## 2017-04-26 DIAGNOSIS — R112 Nausea with vomiting, unspecified: Secondary | ICD-10-CM

## 2017-04-26 DIAGNOSIS — G893 Neoplasm related pain (acute) (chronic): Secondary | ICD-10-CM

## 2017-04-26 DIAGNOSIS — C321 Malignant neoplasm of supraglottis: Secondary | ICD-10-CM | POA: Diagnosis not present

## 2017-04-26 MED ORDER — ALUM & MAG HYDROXIDE-SIMETH 200-200-20 MG/5ML PO SUSP
15.0000 mL | Freq: Four times a day (QID) | ORAL | Status: DC | PRN
Start: 2017-04-26 — End: 2017-04-26
  Administered 2017-04-26: 15 mL via ORAL
  Filled 2017-04-26: qty 30

## 2017-04-26 MED ORDER — HEPARIN SOD (PORK) LOCK FLUSH 100 UNIT/ML IV SOLN
500.0000 [IU] | Freq: Once | INTRAVENOUS | Status: AC | PRN
  Administered 2017-04-26: 500 [IU]
  Filled 2017-04-26: qty 5

## 2017-04-26 MED ORDER — ONDANSETRON HCL 4 MG/2ML IJ SOLN
INTRAMUSCULAR | Status: AC
  Filled 2017-04-26: qty 4

## 2017-04-26 MED ORDER — ONDANSETRON HCL 4 MG/2ML IJ SOLN
8.0000 mg | Freq: Once | INTRAMUSCULAR | Status: AC
  Administered 2017-04-26: 8 mg via INTRAVENOUS

## 2017-04-26 MED ORDER — SODIUM CHLORIDE 0.9 % IV SOLN
Freq: Once | INTRAVENOUS | Status: AC
  Administered 2017-04-26: 14:00:00 via INTRAVENOUS

## 2017-04-26 MED ORDER — MORPHINE SULFATE 30 MG PO TABS: 60.0000 mg | ORAL_TABLET | ORAL | 0 refills | Status: DC | PRN

## 2017-04-26 MED ORDER — SODIUM CHLORIDE 0.9 % IJ SOLN
10.0000 mL | INTRAMUSCULAR | Status: DC | PRN
  Administered 2017-04-26: 10 mL
  Filled 2017-04-26: qty 10

## 2017-04-26 MED FILL — ONDANSETRON HCL 8 MG TABLET: 8 | 20 days supply | Qty: 60 | Fill #2

## 2017-04-26 MED FILL — MORPHINE SULFATE IR 30 MG T: 30 | 7 days supply | Qty: 90 | Fill #0

## 2017-04-26 MED FILL — OMEPRAZOLE 20 MG CAPSULE DR: 20 | 30 days supply | Qty: 60 | Fill #3

## 2017-04-26 MED FILL — FLUTICASONE PROP 50 MCG SPR: 50 | 30 days supply | Qty: 16 | Fill #1

## 2017-04-26 MED FILL — LEVOTHYROXINE 100 MCG TAB: 100 | 30 days supply | Qty: 30 | Fill #2

## 2017-04-26 NOTE — Patient Instructions (Signed)
Dehydration, Adult Dehydration is a condition in which there is not enough fluid or water in the body. This happens when you lose more fluids than you take in. Important organs, such as the kidneys, brain, and heart, cannot function without a proper amount of fluids. Any loss of fluids from the body can lead to dehydration. Dehydration can range from mild to severe. This condition should be treated right away to prevent it from becoming severe. What are the causes? This condition may be caused by:  Vomiting.  Diarrhea.  Excessive sweating, such as from heat exposure or exercise.  Not drinking enough fluid, especially:  When ill.  While doing activity that requires a lot of energy.  Excessive urination.  Fever.  Infection.  Certain medicines, such as medicines that cause the body to lose excess fluid (diuretics).  Inability to access safe drinking water.  Reduced physical ability to get adequate water and food. What increases the risk? This condition is more likely to develop in people:  Who have a poorly controlled long-term (chronic) illness, such as diabetes, heart disease, or kidney disease.  Who are age 65 or older.  Who are disabled.  Who live in a place with high altitude.  Who play endurance sports. What are the signs or symptoms? Symptoms of mild dehydration may include:   Thirst.  Dry lips.  Slightly dry mouth.  Dry, warm skin.  Dizziness. Symptoms of moderate dehydration may include:   Very dry mouth.  Muscle cramps.  Dark urine. Urine may be the color of tea.  Decreased urine production.  Decreased tear production.  Heartbeat that is irregular or faster than normal (palpitations).  Headache.  Light-headedness, especially when you stand up from a sitting position.  Fainting (syncope). Symptoms of severe dehydration may include:   Changes in skin, such as:  Cold and clammy skin.  Blotchy (mottled) or pale skin.  Skin that does  not quickly return to normal after being lightly pinched and released (poor skin turgor).  Changes in body fluids, such as:  Extreme thirst.  No tear production.  Inability to sweat when body temperature is high, such as in hot weather.  Very little urine production.  Changes in vital signs, such as:  Weak pulse.  Pulse that is more than 100 beats a minute when sitting still.  Rapid breathing.  Low blood pressure.  Other changes, such as:  Sunken eyes.  Cold hands and feet.  Confusion.  Lack of energy (lethargy).  Difficulty waking up from sleep.  Short-term weight loss.  Unconsciousness. How is this diagnosed? This condition is diagnosed based on your symptoms and a physical exam. Blood and urine tests may be done to help confirm the diagnosis. How is this treated? Treatment for this condition depends on the severity. Mild or moderate dehydration can often be treated at home. Treatment should be started right away. Do not wait until dehydration becomes severe. Severe dehydration is an emergency and it needs to be treated in a hospital. Treatment for mild dehydration may include:   Drinking more fluids.  Replacing salts and minerals in your blood (electrolytes) that you may have lost. Treatment for moderate dehydration may include:   Drinking an oral rehydration solution (ORS). This is a drink that helps you replace fluids and electrolytes (rehydrate). It can be found at pharmacies and retail stores. Treatment for severe dehydration may include:   Receiving fluids through an IV tube.  Receiving an electrolyte solution through a feeding tube that is   passed through your nose and into your stomach (nasogastric tube, or NG tube).  Correcting any abnormalities in electrolytes.  Treating the underlying cause of dehydration. Follow these instructions at home:  If directed by your health care provider, drink an ORS:  Make an ORS by following instructions on the  package.  Start by drinking small amounts, about  cup (120 mL) every 5-10 minutes.  Slowly increase how much you drink until you have taken the amount recommended by your health care provider.  Drink enough clear fluid to keep your urine clear or pale yellow. If you were told to drink an ORS, finish the ORS first, then start slowly drinking other clear fluids. Drink fluids such as:  Water. Do not drink only water. Doing that can lead to having too little salt (sodium) in the body (hyponatremia).  Ice chips.  Fruit juice that you have added water to (diluted fruit juice).  Low-calorie sports drinks.  Avoid:  Alcohol.  Drinks that contain a lot of sugar. These include high-calorie sports drinks, fruit juice that is not diluted, and soda.  Caffeine.  Foods that are greasy or contain a lot of fat or sugar.  Take over-the-counter and prescription medicines only as told by your health care provider.  Do not take sodium tablets. This can lead to having too much sodium in the body (hypernatremia).  Eat foods that contain a healthy balance of electrolytes, such as bananas, oranges, potatoes, tomatoes, and spinach.  Keep all follow-up visits as told by your health care provider. This is important. Contact a health care provider if:  You have abdominal pain that:  Gets worse.  Stays in one area (localizes).  You have a rash.  You have a stiff neck.  You are more irritable than usual.  You are sleepier or more difficult to wake up than usual.  You feel weak or dizzy.  You feel very thirsty.  You have urinated only a small amount of very dark urine over 6-8 hours. Get help right away if:  You have symptoms of severe dehydration.  You cannot drink fluids without vomiting.  Your symptoms get worse with treatment.  You have a fever.  You have a severe headache.  You have vomiting or diarrhea that:  Gets worse.  Does not go away.  You have blood or green matter  (bile) in your vomit.  You have blood in your stool. This may cause stool to look black and tarry.  You have not urinated in 6-8 hours.  You faint.  Your heart rate while sitting still is over 100 beats a minute.  You have trouble breathing. This information is not intended to replace advice given to you by your health care provider. Make sure you discuss any questions you have with your health care provider. Document Released: 11/28/2005 Document Revised: 06/24/2016 Document Reviewed: 01/22/2016 Elsevier Interactive Patient Education  2017 [employer]  

## 2017-04-26 NOTE — Progress Notes (Signed)
Pt states he feels real bad today-very different than yesterday. He has had persistent nausea with vomiting since 5am.. He has not been able to take his medicines or his pills at all today.  He states some of what he vomits is blood tinged and/or dark in color,  He is not sure if it is his trach that is irritating his throat or it is his just his stomach..  Discussed with Dr. Alvy Bimler.  She recommends holding his chemo for next week-just get IV fluids and keep appt with GI doctor on 05/04/17. Pt is to contact Dr. Noreene Filbert office as well for concerns related to trach.  Discussed with pt and he is ok with the above plan.  In basket message sent to Dr. Calton Dach nurse to help facilitate appt with Dr. [name].  Pt requesting additional fluids tomorrow. Urgent LOS sent for 04/27/17 @ 12:30 pm in [facility]

## 2017-04-27 ENCOUNTER — Ambulatory Visit: Payer: Self-pay

## 2017-04-27 ENCOUNTER — Telehealth: Payer: Self-pay | Admitting: *Deleted

## 2017-04-27 ENCOUNTER — Telehealth: Payer: Self-pay

## 2017-04-27 NOTE — Telephone Encounter (Signed)
-----   Message from Otila Kluver, RN sent at 04/26/2017  5:27 PM EDT ----- Regarding: appt with Dr. [name] Can you help facilitate an appt with Dr. [name] for Mr. Kistler. His trach  Is irritating his throat, causing a lot of discomfort and aggravating his gag reflex.  Needs appt as soon as possible.  thanks

## 2017-04-27 NOTE — Telephone Encounter (Signed)
Called patient @ 1pm as he had not shown up for his 12:30 appt for IV fluids.  Pt answered the phone. He states he is still in bed and had been sleeping-did not wake up in time.  Advised pt to get up and move about and get fluids in him via his peg tube or orally. He said he would. Advised pt that he has an appt with Dr. [name] on 2/33/61 @ 2244 am. He voiced understanding.  Also advised patient that if he felt he needed IV fluids he needed to call so it can be scheduled. Again he voiced understanding.  Cancelled today's IV fluid appt.

## 2017-04-27 NOTE — Telephone Encounter (Signed)
Called nurse at Dr. [name]' s office, given below message. Patient has appt with Dr. [name] on May 16XW at 1050, appt info given to [facility], RN to give to patient.

## 2017-05-01 ENCOUNTER — Other Ambulatory Visit: Payer: Self-pay

## 2017-05-01 ENCOUNTER — Ambulatory Visit: Payer: Self-pay

## 2017-05-02 ENCOUNTER — Ambulatory Visit: Payer: Self-pay

## 2017-05-03 ENCOUNTER — Ambulatory Visit (HOSPITAL_BASED_OUTPATIENT_CLINIC_OR_DEPARTMENT_OTHER): Payer: Medicaid Other

## 2017-05-03 VITALS — BP 105/71 | HR 77 | Temp 98.7°F | Resp 18

## 2017-05-03 DIAGNOSIS — C321 Malignant neoplasm of supraglottis: Secondary | ICD-10-CM | POA: Diagnosis not present

## 2017-05-03 MED ORDER — SODIUM CHLORIDE 0.9 % IV SOLN
Freq: Once | INTRAVENOUS | Status: AC
  Administered 2017-05-03: 13:00:00 via INTRAVENOUS

## 2017-05-03 MED ORDER — HYDROMORPHONE HCL 4 MG/ML IJ SOLN
INTRAMUSCULAR | Status: AC
  Filled 2017-05-03: qty 1

## 2017-05-03 MED ORDER — HYDROMORPHONE HCL 4 MG/ML IJ SOLN
2.0000 mg | Freq: Once | INTRAMUSCULAR | Status: AC
  Administered 2017-05-03: 2 mg via INTRAVENOUS

## 2017-05-03 MED ORDER — HEPARIN SOD (PORK) LOCK FLUSH 100 UNIT/ML IV SOLN
500.0000 [IU] | Freq: Once | INTRAVENOUS | Status: AC | PRN
  Administered 2017-05-03: 500 [IU]
  Filled 2017-05-03: qty 5

## 2017-05-03 MED ORDER — SODIUM CHLORIDE 0.9 % IJ SOLN
10.0000 mL | INTRAMUSCULAR | Status: DC | PRN
  Administered 2017-05-03: 10 mL
  Filled 2017-05-03: qty 10

## 2017-05-03 NOTE — Patient Instructions (Signed)
Dehydration, Adult Dehydration is a condition in which there is not enough fluid or water in the body. This happens when you lose more fluids than you take in. Important organs, such as the kidneys, brain, and heart, cannot function without a proper amount of fluids. Any loss of fluids from the body can lead to dehydration. Dehydration can range from mild to severe. This condition should be treated right away to prevent it from becoming severe. What are the causes? This condition may be caused by:  Vomiting.  Diarrhea.  Excessive sweating, such as from heat exposure or exercise.  Not drinking enough fluid, especially:  When ill.  While doing activity that requires a lot of energy.  Excessive urination.  Fever.  Infection.  Certain medicines, such as medicines that cause the body to lose excess fluid (diuretics).  Inability to access safe drinking water.  Reduced physical ability to get adequate water and food. What increases the risk? This condition is more likely to develop in people:  Who have a poorly controlled long-term (chronic) illness, such as diabetes, heart disease, or kidney disease.  Who are age 65 or older.  Who are disabled.  Who live in a place with high altitude.  Who play endurance sports. What are the signs or symptoms? Symptoms of mild dehydration may include:   Thirst.  Dry lips.  Slightly dry mouth.  Dry, warm skin.  Dizziness. Symptoms of moderate dehydration may include:   Very dry mouth.  Muscle cramps.  Dark urine. Urine may be the color of tea.  Decreased urine production.  Decreased tear production.  Heartbeat that is irregular or faster than normal (palpitations).  Headache.  Light-headedness, especially when you stand up from a sitting position.  Fainting (syncope). Symptoms of severe dehydration may include:   Changes in skin, such as:  Cold and clammy skin.  Blotchy (mottled) or pale skin.  Skin that does  not quickly return to normal after being lightly pinched and released (poor skin turgor).  Changes in body fluids, such as:  Extreme thirst.  No tear production.  Inability to sweat when body temperature is high, such as in hot weather.  Very little urine production.  Changes in vital signs, such as:  Weak pulse.  Pulse that is more than 100 beats a minute when sitting still.  Rapid breathing.  Low blood pressure.  Other changes, such as:  Sunken eyes.  Cold hands and feet.  Confusion.  Lack of energy (lethargy).  Difficulty waking up from sleep.  Short-term weight loss.  Unconsciousness. How is this diagnosed? This condition is diagnosed based on your symptoms and a physical exam. Blood and urine tests may be done to help confirm the diagnosis. How is this treated? Treatment for this condition depends on the severity. Mild or moderate dehydration can often be treated at home. Treatment should be started right away. Do not wait until dehydration becomes severe. Severe dehydration is an emergency and it needs to be treated in a hospital. Treatment for mild dehydration may include:   Drinking more fluids.  Replacing salts and minerals in your blood (electrolytes) that you may have lost. Treatment for moderate dehydration may include:   Drinking an oral rehydration solution (ORS). This is a drink that helps you replace fluids and electrolytes (rehydrate). It can be found at pharmacies and retail stores. Treatment for severe dehydration may include:   Receiving fluids through an IV tube.  Receiving an electrolyte solution through a feeding tube that is   passed through your nose and into your stomach (nasogastric tube, or NG tube).  Correcting any abnormalities in electrolytes.  Treating the underlying cause of dehydration. Follow these instructions at home:  If directed by your health care provider, drink an ORS:  Make an ORS by following instructions on the  package.  Start by drinking small amounts, about  cup (120 mL) every 5-10 minutes.  Slowly increase how much you drink until you have taken the amount recommended by your health care provider.  Drink enough clear fluid to keep your urine clear or pale yellow. If you were told to drink an ORS, finish the ORS first, then start slowly drinking other clear fluids. Drink fluids such as:  Water. Do not drink only water. Doing that can lead to having too little salt (sodium) in the body (hyponatremia).  Ice chips.  Fruit juice that you have added water to (diluted fruit juice).  Low-calorie sports drinks.  Avoid:  Alcohol.  Drinks that contain a lot of sugar. These include high-calorie sports drinks, fruit juice that is not diluted, and soda.  Caffeine.  Foods that are greasy or contain a lot of fat or sugar.  Take over-the-counter and prescription medicines only as told by your health care provider.  Do not take sodium tablets. This can lead to having too much sodium in the body (hypernatremia).  Eat foods that contain a healthy balance of electrolytes, such as bananas, oranges, potatoes, tomatoes, and spinach.  Keep all follow-up visits as told by your health care provider. This is important. Contact a health care provider if:  You have abdominal pain that:  Gets worse.  Stays in one area (localizes).  You have a rash.  You have a stiff neck.  You are more irritable than usual.  You are sleepier or more difficult to wake up than usual.  You feel weak or dizzy.  You feel very thirsty.  You have urinated only a small amount of very dark urine over 6-8 hours. Get help right away if:  You have symptoms of severe dehydration.  You cannot drink fluids without vomiting.  Your symptoms get worse with treatment.  You have a fever.  You have a severe headache.  You have vomiting or diarrhea that:  Gets worse.  Does not go away.  You have blood or green matter  (bile) in your vomit.  You have blood in your stool. This may cause stool to look black and tarry.  You have not urinated in 6-8 hours.  You faint.  Your heart rate while sitting still is over 100 beats a minute.  You have trouble breathing. This information is not intended to replace advice given to you by your health care provider. Make sure you discuss any questions you have with your health care provider. Document Released: 11/28/2005 Document Revised: 06/24/2016 Document Reviewed: 01/22/2016 Elsevier Interactive Patient Education  2017 [employer]  

## 2017-05-04 ENCOUNTER — Encounter: Payer: Self-pay | Admitting: Gastroenterology

## 2017-05-04 ENCOUNTER — Ambulatory Visit (INDEPENDENT_AMBULATORY_CARE_PROVIDER_SITE_OTHER): Payer: Medicaid Other | Admitting: Gastroenterology

## 2017-05-04 VITALS — BP 110/70 | HR 88 | Ht 68.5 in | Wt 170.2 lb

## 2017-05-04 DIAGNOSIS — R1313 Dysphagia, pharyngeal phase: Secondary | ICD-10-CM | POA: Diagnosis not present

## 2017-05-04 DIAGNOSIS — K219 Gastro-esophageal reflux disease without esophagitis: Secondary | ICD-10-CM

## 2017-05-04 MED ORDER — OMEPRAZOLE 40 MG PO CPDR: 40.0000 mg | DELAYED_RELEASE_CAPSULE | Freq: Every day | ORAL | 3 refills | Status: AC

## 2017-05-04 NOTE — Patient Instructions (Signed)
If you are age 54 or older, your body mass index should be between 23-30. Your Body mass index is 25.51 kg/m. If this is out of the aforementioned range listed, please consider follow up with your Primary Care Provider.  If you are age 59 or younger, your body mass index should be between 19-25. Your Body mass index is 25.51 kg/m. If this is out of the aformentioned range listed, please consider follow up with your Primary Care Provider.   You have been scheduled for an endoscopy. Please follow written instructions given to you at your visit today. If you use inhalers (even only as needed), please bring them with you on the day of your procedure. Your physician has requested that you go to www.startemmi.com and enter the access code given to you at your visit today. This web site gives a general overview about your procedure. However, you should still follow specific instructions given to you by our office regarding your preparation for the procedure.  Thank you for choosing [facility]  Dr Wilfrid Lund III

## 2017-05-04 NOTE — Progress Notes (Addendum)
[facility] Consult Note:  History: Kyle Alexander 05/04/2017  Referring physician: [name], MD (Oncology)  Reason for consult/chief complaint: Heartburn; Gastroesophageal Reflux (things are coming back up feeding tube); and Abdominal Pain (around feeding tube)   Subjective  HPI:  This is a 56 year old man referred by Dr. Alvy Alexander of oncology for heartburn.  He is a limited historian, and I have gathered much of the history from a chart review. It appears that he was diagnosed with squamous cell laryngeal cancer about 2 years ago and underwent chemotherapy and radiation. He then had placement of a chronic tracheostomy which was apparently just evaluated by Kyle Alexander of ENT and office visit yesterday. Kyle Alexander states that Kyle Alexander replaced his tracheostomy, it is unknown if any fiberoptic exam of the larynx was undertaken. Kyle Alexander had placement of a surgical open gastrostomy by Kyle Alexander in April 2016. It seems as if he goes to interventional radiology periodically to have it changed out there.  He has dysphagia to solids and gets all of his nutrition through his gastrostomy tube. This is related to the previous treatments and tracheostomy. He is able to swallow some liquids if he pinches his nose to keep the fluid from regurgitating. He is describing frequent and persistent heartburn for about the last year despite once daily omeprazole 20 mg. Fortunately, he continues to smoke several cigarettes a day despite his cancer diagnosis. Lastly, he reports some chronic discomfort around his G-tube site and reflux of stomach contents into the G-tube.  ROS:  Review of Systems  Constitutional: Positive for fatigue. Negative for appetite change and unexpected weight change.  HENT: Negative for mouth sores and voice change.   Eyes: Negative for pain and redness.  Respiratory: Positive for cough and shortness of breath.   Cardiovascular: Negative for chest pain and  palpitations.  Genitourinary: Negative for dysuria and hematuria.  Musculoskeletal: Negative for arthralgias and myalgias.  Skin: Negative for pallor and rash.  Neurological: Negative for weakness and headaches.  Hematological: Negative for adenopathy.   He also has chronic brownish sputum production and keeps a plastic cup with him, bringing up some sputum during the course of the visit.  He has chronic pain for which she is on morphine  Past Medical History: Past Medical History:  Diagnosis Date  . Fatigue 03/28/2016  . GERD (gastroesophageal reflux disease)   . Heartburn symptom 10/19/2015  . Hypertension   . Insomnia 01/22/2016  . Nausea & vomiting 12/15/2015  . S/P radiation therapy 04/16/2015-05/18/2015   Laryngopharynx and bilateral neck / 50 Gy in 20 fractions to gross disease, 45 Gy in 20 fractions to high risk nodal echelons   . Seizures (Kyle Alexander)   . Shortness of breath dyspnea   . Syncopal episodes 04/25/2016  . Throat cancer (Kyle Alexander) 03/16/2015   left cervical lymph node / throat (2016)     Past Surgical History: Past Surgical History:  Procedure Laterality Date  . DIRECT LARYNGOSCOPY WITH BOTOX INJECTION N/A 02/26/2016   Procedure: DIRECT LARYNGOSCOPY ESOPHAGOSCOPY DILATION  AND TRACH REPLACMENT;  Surgeon: Kyle Marble, MD;  Location: [facility];  Service: ENT;  Laterality: N/A;  . ESOPHAGOSCOPY WITH DILITATION N/A 02/26/2016   Procedure: ESOPHAGOSCOPY WITH DILITATION, PHARNGEAL BIOPSY;  Surgeon: Kyle Marble, MD;  Location: [facility];  Service: ENT;  Laterality: N/A;  . GASTROSTOMY N/A 03/31/2015   Procedure: OPEN GASTROSTOMY TUBE PLACEMENT;  Surgeon: Kyle Bookbinder, MD;  Location: [facility];  Service: General;  Laterality:  N/A;  . HERNIA REPAIR     right groin  . IR GENERIC HISTORICAL  08/09/2016   IR GASTRIC TUBE PERC CHG W/O IMG GUIDE 08/09/2016 Kyle Cadet, MD [facility]-INTERV RAD  . IR GENERIC HISTORICAL  02/10/2017   IR [place]  TUBE PERCUT W/FLUORO 02/10/2017 Kyle Mariscal, MD [facility]INTER[facility]  . IR PATIENT EVAL TECH 0-60 MINS  04/12/2017  . KNEE SURGERY  1998   Left  . LYMPH NODE BIOPSY Left 03/16/15   left cervical, consistent with squamous cell carcinoma  . MULTIPLE EXTRACTIONS WITH ALVEOLOPLASTY N/A 03/26/2015   Procedure: EXTRACTION OF TOOTH #'S 1,2,5,6,7,8,9,10,11,12,13,18,19,21,22,23,24,25,26,27,28,29  WITH AVELOPLASTY;  Surgeon: Kyle Alexander, Kyle Alexander;  Location: [place];  Service: Oral Surgery;  Laterality: N/A;  . TONSILLECTOMY    . TONSILLECTOMY AND ADENOIDECTOMY    . TOOTH EXTRACTION  03/26/2015   Procedure: [place];  Surgeon: Kyle Marble, MD;  Location: [facility];  Service: ENT;;  . TRACHEOSTOMY TUBE PLACEMENT N/A 03/26/2015   Procedure: TRACHEOSTOMY ;  Surgeon: Kyle Marble, MD;  Location: [facility] OR;  Service: ENT;  Laterality: N/A;     Family History: Family History  Problem Relation Age of Onset  . Heart disease Mother   . Diabetes Father   . Hypertension Father     Social History: Social History   Social History  . Marital status: Single    Spouse name: N/A  . Number of children: 0  . Years of education: N/A   Social History Main Topics  . Smoking status: Current Every Day Smoker    Packs/day: 0.00    Years: 35.00    Types: Cigarettes  . Smokeless tobacco: Former Systems developer    Types: Chew     Comment: 2-4 cig per day  . Alcohol use 18.0 oz/week    30 Standard drinks or equivalent per week     Comment: occasional  . Drug use: Yes    Types: Marijuana     Comment: occasional   . Sexual activity: Not Currently     Comment: significant other Kyle Alexander   Other Topics Concern  . None   Social History Narrative   Lives alone, unemployed. Previously worked in Architect.   Still smoking a few cigarettes a day  Allergies: Allergies  Allergen Reactions  . Aspirin     Makes me bleed, says this happened when he was a child    Outpatient Meds: Current Outpatient Prescriptions  Medication Sig  Dispense Refill  . fluticasone (FLONASE) 50 MCG/ACT nasal spray Place 2 sprays into both nostrils daily. 16 g 2  . levothyroxine (SYNTHROID) 100 MCG tablet Take 1 tablet (100 mcg total) by mouth daily before breakfast. 30 tablet 9  . morphine (MS CONTIN) 60 MG 12 hr tablet Take 1 tablet (60 mg total) by mouth every 12 (twelve) hours. 60 tablet 0  . morphine (MSIR) 30 MG tablet Take 2 tablets (60 mg total) by mouth every 4 (four) hours as needed for severe pain. 90 tablet 0  . Nutritional Supplements (FEEDING SUPPLEMENT, JEVITY 1.5 Alexander/FIBER,) LIQD Give 1 can Osmolite 1.2 + 1 can of Jevity 1.5 QID via PEG with 60 cc free water before and after bolus. Flush with 240 cc free water BID between feedings. 948 mL   . omeprazole (PRILOSEC) 20 MG capsule Take 1 capsule (20 mg total) by mouth 2 (two) times daily. 60 capsule 0  . ondansetron (ZOFRAN) 8 MG tablet Take 1 tablet (8 mg total) by mouth every 8 (eight)  hours as needed for nausea. 60 tablet 3  . sodium chloride irrigation 0.9 % irrigation USE TO CLEAN AROUND TRACH AND PERFORM TRACH CARE ONCE DAILY AND AS NEEDED 1000 mL 11  . triamcinolone (NASACORT AQ) 55 MCG/ACT AERO nasal inhaler Place 2 sprays into the nose daily. 1 Inhaler 12  . guaiFENesin (ROBITUSSIN) 100 MG/5ML SOLN Take 5 mLs (100 mg total) by mouth every 4 (four) hours as needed for cough or to loosen phlegm. (Patient not taking: Reported on 05/04/2017) 473 mL 3  . lactulose (CHRONULAC) 10 GM/15ML solution Take 15 mLs (10 g total) by mouth 3 (three) times daily. (Patient not taking: Reported on 02/13/2017) 473 mL 2  . lidocaine (XYLOCAINE) 2 % solution Use as directed 15 mLs in the mouth or throat every 8 (eight) hours as needed for mouth pain. (Patient not taking: Reported on 05/04/2017) 100 mL 0  . loperamide (IMODIUM) 2 MG capsule Take 1 capsule (2 mg total) by mouth as needed for diarrhea or loose stools. (Patient not taking: Reported on 03/14/2017) 30 capsule 1  . LORazepam (ATIVAN) 1 MG tablet  Take 1 tablet (1 mg total) by mouth 2 (two) times daily as needed for anxiety (or nausea). (Patient not taking: Reported on 02/13/2017) 10 tablet 0  . omeprazole (PRILOSEC) 40 MG capsule Take 1 capsule (40 mg total) by mouth daily. 90 capsule 3  . polyethylene glycol (MIRALAX) packet Take 17 g by mouth daily. (Patient not taking: Reported on 01/09/2017) 14 each 0   Current Facility-Administered Medications  Medication Dose Route Frequency Provider Last Rate Last Dose  . guaiFENesin-dextromethorphan (ROBITUSSIN DM) 100-10 MG/5ML syrup 10 mL  10 mL Oral Q4H PRN Kyle Alexander, Ni, MD   10 mL at 01/09/17 1427   Facility-Administered Medications Ordered in Other Visits  Medication Dose Route Frequency Provider Last Rate Last Dose  . HYDROmorphone (DILAUDID) injection 2 mg  2 mg Intravenous Q2H PRN Kyle Lark, MD   2 mg at 12/26/16 1440  . sodium chloride 0.9 % injection 10 mL  10 mL Intracatheter PRN Kyle Lark, MD   10 mL at 01/02/17 1107  . sodium chloride flush (NS) 0.9 % injection 10 mL  10 mL Intracatheter PRN Kyle Alexander, Ni, MD   10 mL at 12/26/16 1708      ___________________________________________________________________ Objective   Exam:  BP 110/70 (BP Location: Left Arm, Patient Position: Sitting, Cuff Size: Normal)   Pulse 88   Ht 5' 8.5" (1.74 m) Comment: height measured without shoes  Wt 170 lb 4 oz (77.2 kg)   BMI 25.51 kg/m    General: this is a(n) Chronically ill-appearing man. He has an expected gravelly vocal quality due to his tracheostomy   Eyes: sclera anicteric, no redness  ENT: oral mucosa moist without lesions, no cervical or supraclavicular lymphadenopathy, good dentition  CV: RRR without murmur, S1/S2, no JVD, no peripheral edema  Resp: clear to auscultation bilaterally, normal RR and effort noted  GI: soft, no tenderness, with active bowel sounds. No guarding or palpable organomegaly noted.  There is no erythema or tenderness around his gastrostomy site and no  drainage expressed from it with palpation. He has a balloon type replacement gastrostomy tube in good position. I tightened the external bumper a half centimeter because it was a little loose.  Skin; warm and dry, no rash or jaundice noted  Neuro: awake, alert and oriented x 3. Normal gross motor function     Assessment: Encounter Diagnoses  Name Primary?  Marland Kitchen  Gastroesophageal reflux disease, esophagitis presence not specified   . Dysphagia, cricopharyngeal Yes    I suspect his persistent GERD is at least partially related to ongoing tobacco abuse, but mostly related to poor gastric emptying from chronic opioid use. It is possible he has an additional anatomic cause such as a hiatal hernia. Gastric outlet obstruction from a malpositioned G-tube balloon seems unlikely since he is not vomiting.  Plan:  We will try to obtain the ENT office note from yesterday He needs to make every effort to stop smoking as soon as possible, and was strongly advised of this. Increase omeprazole from 20 mg daily to 40 mg once daily Upper endoscopy to evaluate possible esophagitis and rule out malignancy. His dysphagia appears to be oral pharyngeal due to the above-noted factors, but patients with laryngeal squamous cancer are at an increased risk to develop esophageal malignancy. This procedure must be done in the hospital outpatient endoscopy lab because there is increased respiratory risk.  Thank you for the courtesy of this consult.  Please call me with any questions or concerns.  Nelida Meuse III  CC: Kyle Lark, MD  Addendum 05/16/17;  ENT office note from 9/57/47 by Dr Erik Alexander reports no evidence of recurrent laryngeal cancer on fiberoptic exam.  Wilfrid Lund, MD

## 2017-05-12 MED FILL — OMEPRAZOLE DR 40 MG CAPSULE: 40 | 30 days supply | Qty: 30 | Fill #0

## 2017-05-15 ENCOUNTER — Other Ambulatory Visit (HOSPITAL_BASED_OUTPATIENT_CLINIC_OR_DEPARTMENT_OTHER): Payer: Medicaid Other

## 2017-05-15 ENCOUNTER — Ambulatory Visit: Payer: Medicaid Other

## 2017-05-15 ENCOUNTER — Ambulatory Visit (HOSPITAL_BASED_OUTPATIENT_CLINIC_OR_DEPARTMENT_OTHER): Payer: Medicaid Other

## 2017-05-15 ENCOUNTER — Other Ambulatory Visit: Payer: Self-pay | Admitting: Hematology and Oncology

## 2017-05-15 ENCOUNTER — Other Ambulatory Visit: Payer: Self-pay | Admitting: *Deleted

## 2017-05-15 VITALS — BP 110/76 | HR 76 | Temp 99.1°F | Resp 18 | Ht 68.5 in | Wt 168.7 lb

## 2017-05-15 DIAGNOSIS — C321 Malignant neoplasm of supraglottis: Secondary | ICD-10-CM

## 2017-05-15 DIAGNOSIS — Z5111 Encounter for antineoplastic chemotherapy: Secondary | ICD-10-CM

## 2017-05-15 LAB — CBC WITH DIFFERENTIAL/PLATELET
BASO%: 0.1 % (ref 0.0–2.0)
BASOS ABS: 0 10*3/uL (ref 0.0–0.1)
EOS ABS: 0.1 10*3/uL (ref 0.0–0.5)
EOS%: 1 % (ref 0.0–7.0)
HEMATOCRIT: 38.1 % — AB (ref 38.4–49.9)
HGB: 12.9 g/dL — ABNORMAL LOW (ref 13.0–17.1)
LYMPH%: 4.7 % — ABNORMAL LOW (ref 14.0–49.0)
MCH: 33.8 pg — AB (ref 27.2–33.4)
MCHC: 33.9 g/dL (ref 32.0–36.0)
MCV: 99.8 fL — AB (ref 79.3–98.0)
MONO#: 0.8 10*3/uL (ref 0.1–0.9)
MONO%: 8.6 % (ref 0.0–14.0)
NEUT#: 8.4 10*3/uL — ABNORMAL HIGH (ref 1.5–6.5)
NEUT%: 85.6 % — ABNORMAL HIGH (ref 39.0–75.0)
PLATELETS: 211 10*3/uL (ref 140–400)
RBC: 3.82 10*6/uL — ABNORMAL LOW (ref 4.20–5.82)
RDW: 15.7 % — ABNORMAL HIGH (ref 11.0–14.6)
WBC: 9.8 10*3/uL (ref 4.0–10.3)
lymph#: 0.5 10*3/uL — ABNORMAL LOW (ref 0.9–3.3)

## 2017-05-15 LAB — COMPREHENSIVE METABOLIC PANEL
ALBUMIN: 3.8 g/dL (ref 3.5–5.0)
ALT: 11 U/L (ref 0–55)
ANION GAP: 9 meq/L (ref 3–11)
AST: 12 U/L (ref 5–34)
Alkaline Phosphatase: 55 U/L (ref 40–150)
BILIRUBIN TOTAL: 0.33 mg/dL (ref 0.20–1.20)
BUN: 15.7 mg/dL (ref 7.0–26.0)
CALCIUM: 9.7 mg/dL (ref 8.4–10.4)
CO2: 28 mEq/L (ref 22–29)
Chloride: 104 mEq/L (ref 98–109)
Creatinine: 0.8 mg/dL (ref 0.7–1.3)
Glucose: 152 mg/dl — ABNORMAL HIGH (ref 70–140)
POTASSIUM: 3.6 meq/L (ref 3.5–5.1)
Sodium: 140 mEq/L (ref 136–145)
Total Protein: 6.8 g/dL (ref 6.4–8.3)

## 2017-05-15 MED ORDER — FAMOTIDINE IN NACL 20-0.9 MG/50ML-% IV SOLN
INTRAVENOUS | Status: AC
  Filled 2017-05-15: qty 50

## 2017-05-15 MED ORDER — PALONOSETRON HCL INJECTION 0.25 MG/5ML
0.2500 mg | Freq: Once | INTRAVENOUS | Status: AC
  Administered 2017-05-15: 0.25 mg via INTRAVENOUS

## 2017-05-15 MED ORDER — SODIUM CHLORIDE 0.9% FLUSH
10.0000 mL | Freq: Once | INTRAVENOUS | Status: AC
  Administered 2017-05-15: 10 mL
  Filled 2017-05-15: qty 10

## 2017-05-15 MED ORDER — FOSAPREPITANT DIMEGLUMINE INJECTION 150 MG
Freq: Once | INTRAVENOUS | Status: AC
  Administered 2017-05-15: 15:00:00 via INTRAVENOUS
  Filled 2017-05-15: qty 5

## 2017-05-15 MED ORDER — DEXTROSE 5 % IV SOLN
80.0000 mg/m2 | Freq: Once | INTRAVENOUS | Status: AC
  Administered 2017-05-15: 156 mg via INTRAVENOUS
  Filled 2017-05-15: qty 26

## 2017-05-15 MED ORDER — DIPHENHYDRAMINE HCL 50 MG/ML IJ SOLN
50.0000 mg | Freq: Once | INTRAMUSCULAR | Status: AC
  Administered 2017-05-15: 50 mg via INTRAVENOUS

## 2017-05-15 MED ORDER — SODIUM CHLORIDE 0.9 % IV SOLN
Freq: Once | INTRAVENOUS | Status: AC
  Administered 2017-05-15: 15:00:00 via INTRAVENOUS

## 2017-05-15 MED ORDER — HYDROMORPHONE HCL 4 MG/ML IJ SOLN
2.0000 mg | Freq: Once | INTRAMUSCULAR | Status: AC
  Administered 2017-05-15: 2 mg via INTRAVENOUS

## 2017-05-15 MED ORDER — HEPARIN SOD (PORK) LOCK FLUSH 100 UNIT/ML IV SOLN
500.0000 [IU] | Freq: Once | INTRAVENOUS | Status: AC | PRN
  Administered 2017-05-15: 500 [IU]
  Filled 2017-05-15: qty 5

## 2017-05-15 MED ORDER — SODIUM CHLORIDE 0.9% FLUSH
10.0000 mL | INTRAVENOUS | Status: DC | PRN
  Administered 2017-05-15: 10 mL
  Filled 2017-05-15: qty 10

## 2017-05-15 MED ORDER — DIPHENHYDRAMINE HCL 50 MG/ML IJ SOLN
INTRAMUSCULAR | Status: AC
  Filled 2017-05-15: qty 1

## 2017-05-15 MED ORDER — HYDROMORPHONE HCL 4 MG/ML IJ SOLN
INTRAMUSCULAR | Status: AC
  Filled 2017-05-15: qty 1

## 2017-05-15 MED ORDER — FAMOTIDINE IN NACL 20-0.9 MG/50ML-% IV SOLN
20.0000 mg | Freq: Once | INTRAVENOUS | Status: AC
  Administered 2017-05-15: 20 mg via INTRAVENOUS

## 2017-05-15 MED ORDER — PALONOSETRON HCL INJECTION 0.25 MG/5ML
INTRAVENOUS | Status: AC
  Filled 2017-05-15: qty 5

## 2017-05-15 NOTE — Patient Instructions (Signed)
[facility] Discharge Instructions for Patients Receiving Chemotherapy  Today you received the following chemotherapy agents Taxol   To help prevent nausea and vomiting after your treatment, we encourage you to take your nausea medication as directed.   If you develop nausea and vomiting that is not controlled by your nausea medication, call the clinic.   BELOW ARE SYMPTOMS THAT SHOULD BE REPORTED IMMEDIATELY:  *FEVER GREATER THAN 100.5 F  *CHILLS WITH OR WITHOUT FEVER  NAUSEA AND VOMITING THAT IS NOT CONTROLLED WITH YOUR NAUSEA MEDICATION  *UNUSUAL SHORTNESS OF BREATH  *UNUSUAL BRUISING OR BLEEDING  TENDERNESS IN MOUTH AND THROAT WITH OR WITHOUT PRESENCE OF ULCERS  *URINARY PROBLEMS  *BOWEL PROBLEMS  UNUSUAL RASH Items with * indicate a potential emergency and should be followed up as soon as possible.  Feel free to call the clinic you have any questions or concerns. The clinic phone number is [phone].  Please show the CHEMO ALERT CARD at check-in to the Emergency Department and triage nurse.   

## 2017-05-16 ENCOUNTER — Telehealth: Payer: Self-pay | Admitting: *Deleted

## 2017-05-16 ENCOUNTER — Ambulatory Visit: Payer: Self-pay

## 2017-05-16 NOTE — Telephone Encounter (Signed)
Returned patient's phone call regarding appointments. Patient wants to cancel today's appointments. He will come tomorrow for IVF.

## 2017-05-16 NOTE — Progress Notes (Signed)
Nutrition  Planning to see patient during IV fluids today but did not show up for fluids.  Will follow-up as able.  Davion Meara B. Zenia Resides, [place], [place] Registered Dietitian [phone] (pager)

## 2017-05-17 ENCOUNTER — Ambulatory Visit: Payer: Self-pay

## 2017-05-22 ENCOUNTER — Ambulatory Visit: Payer: Self-pay | Admitting: Hematology and Oncology

## 2017-05-22 ENCOUNTER — Other Ambulatory Visit: Payer: Self-pay

## 2017-05-22 ENCOUNTER — Telehealth: Payer: Self-pay

## 2017-05-22 ENCOUNTER — Ambulatory Visit: Payer: Self-pay

## 2017-05-22 NOTE — Telephone Encounter (Signed)
Patient called and left message to call him. Called patient back, he wants to cancel all of his appts for today, and for the rest of the week. He is out of town and will have to reschedule.

## 2017-05-23 ENCOUNTER — Ambulatory Visit: Payer: Self-pay

## 2017-05-24 ENCOUNTER — Other Ambulatory Visit: Payer: Self-pay

## 2017-05-24 ENCOUNTER — Ambulatory Visit: Payer: Self-pay

## 2017-05-24 ENCOUNTER — Telehealth: Payer: Self-pay

## 2017-05-24 NOTE — Telephone Encounter (Signed)
-----   Message from [name], MD sent at 05/24/2017  6:58 AM EDT ----- Regarding: no appointments Can you call and ask when he wants to come back/resume Rx?

## 2017-05-24 NOTE — Telephone Encounter (Signed)
Left message to call nurse back.

## 2017-05-25 ENCOUNTER — Other Ambulatory Visit: Payer: Self-pay | Admitting: Hematology and Oncology

## 2017-05-25 ENCOUNTER — Telehealth: Payer: Self-pay | Admitting: Hematology and Oncology

## 2017-05-25 ENCOUNTER — Telehealth: Payer: Self-pay | Admitting: *Deleted

## 2017-05-25 DIAGNOSIS — C321 Malignant neoplasm of supraglottis: Secondary | ICD-10-CM

## 2017-05-25 DIAGNOSIS — C770 Secondary and unspecified malignant neoplasm of lymph nodes of head, face and neck: Secondary | ICD-10-CM

## 2017-05-25 NOTE — Telephone Encounter (Signed)
-----   Message from [name], MD sent at 05/24/2017  6:58 AM EDT ----- Regarding: no appointments Can you call and ask when he wants to come back/resume Rx?

## 2017-05-25 NOTE — Telephone Encounter (Signed)
sw pt to confirm 6/26 appt per sch msg

## 2017-05-25 NOTE — Telephone Encounter (Signed)
Pt left message to make appts and he will be here

## 2017-05-31 MED FILL — LEVOTHYROXINE 100 MCG TAB: 100 | 30 days supply | Qty: 30 | Fill #3

## 2017-06-05 ENCOUNTER — Telehealth: Payer: Self-pay

## 2017-06-05 ENCOUNTER — Other Ambulatory Visit: Payer: Self-pay | Admitting: Hematology and Oncology

## 2017-06-05 DIAGNOSIS — G893 Neoplasm related pain (acute) (chronic): Secondary | ICD-10-CM

## 2017-06-05 MED ORDER — MORPHINE SULFATE 30 MG PO TABS: 60.0000 mg | ORAL_TABLET | ORAL | 0 refills | Status: DC | PRN

## 2017-06-05 MED ORDER — MORPHINE SULFATE ER 60 MG PO TBCR: 60.0000 mg | EXTENDED_RELEASE_TABLET | Freq: Two times a day (BID) | ORAL | 0 refills | Status: DC

## 2017-06-05 MED FILL — MORPHINE SULFATE IR 30 MG T: 30 | 8 days supply | Qty: 90 | Fill #0

## 2017-06-05 MED FILL — MORPHINE SULF 60 MG TAB SA: 60 | 30 days supply | Qty: 60 | Fill #0

## 2017-06-05 NOTE — Telephone Encounter (Signed)
Called patient with below message. Patient will come in at 1130 for labs and 1200 for flush, scheduling message sent.

## 2017-06-05 NOTE — Telephone Encounter (Signed)
Patient called wanting refill on Morphine 30 mg and MS Contin 12 hr tablet. Wants to pick it up today.

## 2017-06-05 NOTE — Telephone Encounter (Signed)
Ready Can you move his labs/flush tomorrow to be done before CT>?

## 2017-06-06 ENCOUNTER — Encounter: Payer: Self-pay | Admitting: *Deleted

## 2017-06-06 ENCOUNTER — Ambulatory Visit (HOSPITAL_BASED_OUTPATIENT_CLINIC_OR_DEPARTMENT_OTHER): Payer: Medicaid Other

## 2017-06-06 ENCOUNTER — Ambulatory Visit (HOSPITAL_COMMUNITY)
Admission: RE | Admit: 2017-06-06 | Discharge: 2017-06-06 | Disposition: A | Discharge status: Home / Self Care | Payer: Medicaid Other | Source: Ambulatory Visit | Attending: Hematology and Oncology | Admitting: Hematology and Oncology

## 2017-06-06 ENCOUNTER — Other Ambulatory Visit: Payer: Self-pay | Admitting: Hematology and Oncology

## 2017-06-06 ENCOUNTER — Ambulatory Visit (HOSPITAL_BASED_OUTPATIENT_CLINIC_OR_DEPARTMENT_OTHER): Payer: Medicaid Other | Admitting: Hematology and Oncology

## 2017-06-06 ENCOUNTER — Other Ambulatory Visit (HOSPITAL_BASED_OUTPATIENT_CLINIC_OR_DEPARTMENT_OTHER): Payer: Medicaid Other

## 2017-06-06 VITALS — BP 118/73 | HR 83 | Temp 98.4°F | Resp 17 | Wt 169.3 lb

## 2017-06-06 DIAGNOSIS — G893 Neoplasm related pain (acute) (chronic): Secondary | ICD-10-CM | POA: Diagnosis not present

## 2017-06-06 DIAGNOSIS — C321 Malignant neoplasm of supraglottis: Secondary | ICD-10-CM | POA: Diagnosis not present

## 2017-06-06 DIAGNOSIS — Z930 Tracheostomy status: Secondary | ICD-10-CM

## 2017-06-06 DIAGNOSIS — C770 Secondary and unspecified malignant neoplasm of lymph nodes of head, face and neck: Secondary | ICD-10-CM | POA: Insufficient documentation

## 2017-06-06 DIAGNOSIS — R590 Localized enlarged lymph nodes: Secondary | ICD-10-CM | POA: Diagnosis not present

## 2017-06-06 LAB — COMPREHENSIVE METABOLIC PANEL
ALBUMIN: 3.9 g/dL (ref 3.5–5.0)
ALT: 11 U/L (ref 0–55)
AST: 13 U/L (ref 5–34)
Alkaline Phosphatase: 61 U/L (ref 40–150)
Anion Gap: 9 mEq/L (ref 3–11)
BUN: 10.9 mg/dL (ref 7.0–26.0)
CO2: 26 meq/L (ref 22–29)
Calcium: 10.2 mg/dL (ref 8.4–10.4)
Chloride: 106 mEq/L (ref 98–109)
Creatinine: 0.8 mg/dL (ref 0.7–1.3)
GLUCOSE: 106 mg/dL (ref 70–140)
Potassium: 3.8 mEq/L (ref 3.5–5.1)
SODIUM: 140 meq/L (ref 136–145)
TOTAL PROTEIN: 7.3 g/dL (ref 6.4–8.3)
Total Bilirubin: 0.4 mg/dL (ref 0.20–1.20)

## 2017-06-06 LAB — CBC WITH DIFFERENTIAL/PLATELET
BASO%: 0.5 % (ref 0.0–2.0)
Basophils Absolute: 0 10*3/uL (ref 0.0–0.1)
EOS%: 0.5 % (ref 0.0–7.0)
Eosinophils Absolute: 0 10*3/uL (ref 0.0–0.5)
HCT: 39.9 % (ref 38.4–49.9)
HEMOGLOBIN: 13.5 g/dL (ref 13.0–17.1)
LYMPH%: 6.1 % — ABNORMAL LOW (ref 14.0–49.0)
MCH: 33.4 pg (ref 27.2–33.4)
MCHC: 33.8 g/dL (ref 32.0–36.0)
MCV: 98.7 fL — ABNORMAL HIGH (ref 79.3–98.0)
MONO#: 0.6 10*3/uL (ref 0.1–0.9)
MONO%: 6.5 % (ref 0.0–14.0)
NEUT%: 86.4 % — ABNORMAL HIGH (ref 39.0–75.0)
NEUTROS ABS: 7.8 10*3/uL — AB (ref 1.5–6.5)
Platelets: 196 10*3/uL (ref 140–400)
RBC: 4.04 10*6/uL — ABNORMAL LOW (ref 4.20–5.82)
RDW: 15.4 % — AB (ref 11.0–14.6)
WBC: 9 10*3/uL (ref 4.0–10.3)
lymph#: 0.6 10*3/uL — ABNORMAL LOW (ref 0.9–3.3)

## 2017-06-06 MED ORDER — SODIUM CHLORIDE 0.9% FLUSH
10.0000 mL | Freq: Once | INTRAVENOUS | Status: AC
  Administered 2017-06-06: 10 mL via INTRAVENOUS
  Filled 2017-06-06: qty 10

## 2017-06-06 MED ORDER — IOPAMIDOL (ISOVUE-300) INJECTION 61%
INTRAVENOUS | Status: AC
  Administered 2017-06-06: 75 mL
  Filled 2017-06-06: qty 75

## 2017-06-06 NOTE — Progress Notes (Signed)
Oncology Nurse Navigator Documentation  To provide support, encouragement and care continuity, met with Kyle Alexander during est pt appt with Dr. Alvy Bimler. He reported increase in neck fullness over past couple of weeks, denied pain or nausea. She discussed preliminary results of CT Neck just completed.  Radiologist report not available. He indicated he wants to proceed with next chemo option, "I'm not ready to go yet." He understands Dr. Alvy Bimler will contact him with CT findings when available. He understands to contact me with needs/concerns.  Gayleen Orem, RN, BSN, [place] [facility] Oncology Nurse [facility] [phone]

## 2017-06-07 ENCOUNTER — Ambulatory Visit (HOSPITAL_BASED_OUTPATIENT_CLINIC_OR_DEPARTMENT_OTHER): Payer: Medicaid Other

## 2017-06-07 ENCOUNTER — Encounter: Payer: Self-pay | Admitting: Hematology and Oncology

## 2017-06-07 VITALS — BP 120/93 | HR 81 | Temp 100.0°F | Resp 18

## 2017-06-07 DIAGNOSIS — Z5111 Encounter for antineoplastic chemotherapy: Secondary | ICD-10-CM

## 2017-06-07 DIAGNOSIS — C321 Malignant neoplasm of supraglottis: Secondary | ICD-10-CM | POA: Diagnosis not present

## 2017-06-07 MED ORDER — DEXTROSE 5 % IV SOLN
80.0000 mg/m2 | Freq: Once | INTRAVENOUS | Status: AC
  Administered 2017-06-07: 156 mg via INTRAVENOUS
  Filled 2017-06-07: qty 26

## 2017-06-07 MED ORDER — PALONOSETRON HCL INJECTION 0.25 MG/5ML
INTRAVENOUS | Status: AC
  Filled 2017-06-07: qty 5

## 2017-06-07 MED ORDER — FAMOTIDINE IN NACL 20-0.9 MG/50ML-% IV SOLN
20.0000 mg | Freq: Once | INTRAVENOUS | Status: AC
  Administered 2017-06-07: 20 mg via INTRAVENOUS

## 2017-06-07 MED ORDER — SODIUM CHLORIDE 0.9 % IV SOLN
Freq: Once | INTRAVENOUS | Status: AC
  Administered 2017-06-07: 10:00:00 via INTRAVENOUS
  Filled 2017-06-07: qty 5

## 2017-06-07 MED ORDER — SODIUM CHLORIDE 0.9% FLUSH
10.0000 mL | INTRAVENOUS | Status: DC | PRN
Start: 2017-06-07 — End: 2017-06-07
  Administered 2017-06-07: 10 mL
  Filled 2017-06-07: qty 10

## 2017-06-07 MED ORDER — SODIUM CHLORIDE 0.9 % IV SOLN
Freq: Once | INTRAVENOUS | Status: AC
  Administered 2017-06-07: 09:00:00 via INTRAVENOUS

## 2017-06-07 MED ORDER — FAMOTIDINE IN NACL 20-0.9 MG/50ML-% IV SOLN
INTRAVENOUS | Status: AC
  Filled 2017-06-07: qty 50

## 2017-06-07 MED ORDER — HYDROMORPHONE HCL 4 MG/ML IJ SOLN
INTRAMUSCULAR | Status: AC
  Filled 2017-06-07: qty 1

## 2017-06-07 MED ORDER — HEPARIN SOD (PORK) LOCK FLUSH 100 UNIT/ML IV SOLN
500.0000 [IU] | Freq: Once | INTRAVENOUS | Status: AC | PRN
  Administered 2017-06-07: 500 [IU]
  Filled 2017-06-07: qty 5

## 2017-06-07 MED ORDER — HYDROMORPHONE HCL 4 MG/ML IJ SOLN
2.0000 mg | Freq: Once | INTRAMUSCULAR | Status: AC
  Administered 2017-06-07: 2 mg via INTRAVENOUS

## 2017-06-07 MED ORDER — PALONOSETRON HCL INJECTION 0.25 MG/5ML
0.2500 mg | Freq: Once | INTRAVENOUS | Status: AC
  Administered 2017-06-07: 0.25 mg via INTRAVENOUS

## 2017-06-07 MED ORDER — DIPHENHYDRAMINE HCL 50 MG/ML IJ SOLN
INTRAMUSCULAR | Status: AC
  Filled 2017-06-07: qty 1

## 2017-06-07 MED ORDER — DIPHENHYDRAMINE HCL 50 MG/ML IJ SOLN
50.0000 mg | Freq: Once | INTRAMUSCULAR | Status: AC
  Administered 2017-06-07: 50 mg via INTRAVENOUS

## 2017-06-07 NOTE — Assessment & Plan Note (Signed)
He denies recent bleeding from the tracheostomy site. I recommend he continue close follow-up to see his ENT doctor for tracheostomy site care

## 2017-06-07 NOTE — Assessment & Plan Note (Addendum)
This is overall stable He will continue taking MS Contin and breakthrough pain medicine as needed He denies excessive constipation 

## 2017-06-07 NOTE — Assessment & Plan Note (Signed)
CT scan show stable disease He tolerated treatment very poorly I recommend we proceed with treatment along with IV fluid support for 2 days with each cycle I plan to space out his treatment to every other week treatment I will repeat imaging study again at the end of September

## 2017-06-07 NOTE — Assessment & Plan Note (Signed)
Examination is stable Continue close observation

## 2017-06-07 NOTE — Progress Notes (Signed)
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[place])   03/15/2015 - 03/17/2015 Hospital Admission    He was admitted to the hospital for evaluation of dysphagia, SOB, hemoptosis, hoarseness, 30-40 pound weight loss and worsening bilateral neck masses for 5 months      03/15/2015 Imaging    Ct showed extensive circumferential malignancy in the hypopharyngeal/supraglottic region with regional LN metastases      03/16/2015 Procedure    He underwent ULTRASOUND-GUIDED BIOPSY OF LEFT CERVICAL LYMPH NODES      03/16/2015 Pathology Results    Accession: [id number] LN biopsy showed invasive squamous cell cancer      03/16/2015 Pathology Results    Accession: [id number] showed atypical squamous cells      03/25/2015 - 04/07/2015 Hospital Admission    He was admitted to the hospital and underwent tracheostomy placement, feeeding tube placement but subsequently left [facility]      03/26/2015 Surgery    He had multiple extraction of tooth numbers 1, 2, 5, 6, 7, 8, 9, 10, 11, 12, 13, 18, 19, 21, 22, 23, 24, 25, 26, 27, 28, and 29. and 4 Quadrants of alveoloplasty      03/26/2015 Surgery    He underwent tracheostomy      03/31/2015 Surgery    He had open gastrostomy tube placement by Dr. Donne Hazel      04/16/2015 - 05/18/2015 Radiation Therapy    Laryngopharynx and bilateral neck / 50 Gy in 20 fractions to gross disease, 45 Gy in 20 fractions to high risk nodal echelons  Beams/energy: Helical IMRT / 6 MV photons      04/16/2015 Procedure    Fluoroscopic reposition of the 18 French gastrostomy confirmed back in the stomach,      07/03/2015 Procedure    IR  performed replacement of gastrostomy tube with a new 98 French balloon retention tube      10/01/2015 Imaging    PEt scan showed persistent hypermetabolism within the primary supraglottic laryngeal tumor and within right retropharyngeal, bilateral level II and left level IV cervical nodal metastases      10/28/2015 Procedure    He had placement of PICC line. The IR was not able to place PORT due to suspected upper respiratory infection      11/13/2015 - 03/21/2016 Chemotherapy    He received 5FU, carboplatin chemo with weekly Erbitux      11/19/2015 Surgery    Gastrostomy tube replaced.      11/30/2015 Procedure    Placement of right jugular port-a-cath.      11/30/2015 Procedure    PICC removed.      12/30/2015 Imaging    PET CT showed positive response to Rx      02/26/2016 Procedure    He underwent direct laryngoscopy with biopsy. Esophageal dilatation.       02/26/2016 Pathology Results    Repeat biopsy of supraglottis showed persistent disease      02/29/2016 Procedure    Gastrostomy tube exchanged.      03/28/2016 PET scan    Hypermetabolic tissue in the posterior RIGHT hypopharynx is similar in pattern to PET-CT of 12/30/2015 but increased in metabolic activity.2. Hypermetabolic tissue / lymph  nodes in the LEFT supraclavicular nodal station are in a similar pattern       04/04/2016 - 11/29/2016 Chemotherapy    He started on palliative chemotherapy with pembrolizumab      05/03/2016 Imaging    MRI head is negative      06/02/2016 Imaging    Mild improvement of diffuse pharyngeal and supraglottic edema.Increased asymmetry of right-sided hypopharyngeal/supraglottic softtissue compared to the prior neck CT with FDG uptake in this region on prior PET-CT. Residual/recurrent tumor is possible      08/09/2016 Procedure    IR placed new 20 French percutaneous gastrostomy tube.      08/26/2016 Imaging    Ct neck showed unchanged diffuse pharyngeal and supraglottic  edema as well as asymmetric soft tissue thickening on the right. Slightly decreased size of some left level II lymph nodes. Unchanged lymphadenopathy elsewhere in the neck. Decreased size of thyroid mass. No evidence of metastatic cancer to the chest      12/13/2016 Imaging    Ct chest showed no evidence of thoracic metastatic disease or primary thoracic malignancy.      12/13/2016 Imaging    Ct neck showed progression of of RIGHT supraglottic mass compared with priors, approximate size 24 x 27 x 27 mm. Extension caudally along the RIGHT area epiglottic fold with increasing mass effect on the airway. Tracheostomy satisfactory position. Stable to slightly improved malignant adenopathy.      12/26/2016 -  Chemotherapy    He received chemotherapy with weekly Taxol       02/10/2017 Imaging    Very similar appearance to the study of January. Right sided supraglottic to glottic mass is similar, perhaps a few mm smaller. Bilateral enlarged nodes appear the same. No progressive finding      02/10/2017 Imaging    No evidence of metastatic disease in the abdomen/pelvis. Percutaneous gastrostomy in satisfactory position. Cholelithiasis, without associated inflammatory changes.      06/06/2017 Imaging    CT neck: Supraglottic tumor on the right is mildly smaller now measuring 23 x 16 mm. Improvement in bilateral cervical adenopathy. No new adenopathy identified       INTERVAL HISTORY: Please see below for problem oriented charting. He returns for further follow-up He continues to have pain in his neck at the site of tracheostomy He denies recent bleeding. No recent infection Denies peripheral neuropathy He continues to have nausea with each round of treatment  REVIEW OF SYSTEMS:   Constitutional: Denies fevers, chills or abnormal weight loss Eyes: Denies blurriness of vision Ears, nose, mouth, throat, and face: Denies mucositis or sore throat Respiratory: Denies cough, dyspnea or  wheezes Cardiovascular: Denies palpitation, chest discomfort or lower extremity swelling Gastrointestinal:  Denies nausea, heartburn or change in bowel habits Skin: Denies abnormal skin rashes Lymphatics: Denies new lymphadenopathy or easy bruising Neurological:Denies numbness, tingling or new weaknesses Behavioral/Psych: Mood is stable, no new changes  All other systems were reviewed with the patient and are negative.  I have reviewed the past medical history, past surgical history, social history and family history with the patient and they are unchanged from previous note.  ALLERGIES:  is allergic to aspirin.  MEDICATIONS:  Current Outpatient Prescriptions  Medication Sig Dispense Refill  . fluticasone (FLONASE) 50 MCG/ACT nasal spray Place 2 sprays into both nostrils daily. 16 g 2  . guaiFENesin (ROBITUSSIN) 100 MG/5ML SOLN Take 5 mLs (100 mg total) by mouth every 4 (four) hours as needed for cough or to loosen phlegm. (Patient  not taking: Reported on 05/04/2017) 473 mL 3  . lactulose (CHRONULAC) 10 GM/15ML solution Take 15 mLs (10 g total) by mouth 3 (three) times daily. (Patient not taking: Reported on 02/13/2017) 473 mL 2  . levothyroxine (SYNTHROID) 100 MCG tablet Take 1 tablet (100 mcg total) by mouth daily before breakfast. 30 tablet 9  . lidocaine (XYLOCAINE) 2 % solution Use as directed 15 mLs in the mouth or throat every 8 (eight) hours as needed for mouth pain. (Patient not taking: Reported on 05/04/2017) 100 mL 0  . loperamide (IMODIUM) 2 MG capsule Take 1 capsule (2 mg total) by mouth as needed for diarrhea or loose stools. (Patient not taking: Reported on 03/14/2017) 30 capsule 1  . LORazepam (ATIVAN) 1 MG tablet Take 1 tablet (1 mg total) by mouth 2 (two) times daily as needed for anxiety (or nausea). (Patient not taking: Reported on 02/13/2017) 10 tablet 0  . morphine (MS CONTIN) 60 MG 12 hr tablet Take 1 tablet (60 mg total) by mouth every 12 (twelve) hours. 60 tablet 0  .  morphine (MSIR) 30 MG tablet Take 2 tablets (60 mg total) by mouth every 4 (four) hours as needed for severe pain. 90 tablet 0  . Nutritional Supplements (FEEDING SUPPLEMENT, JEVITY 1.5 CAL/FIBER,) LIQD Give 1 can Osmolite 1.2 + 1 can of Jevity 1.5 QID via PEG with 60 cc free water before and after bolus. Flush with 240 cc free water BID between feedings. 948 mL   . omeprazole (PRILOSEC) 20 MG capsule Take 1 capsule (20 mg total) by mouth 2 (two) times daily. 60 capsule 0  . omeprazole (PRILOSEC) 40 MG capsule Take 1 capsule (40 mg total) by mouth daily. 90 capsule 3  . ondansetron (ZOFRAN) 8 MG tablet Take 1 tablet (8 mg total) by mouth every 8 (eight) hours as needed for nausea. 60 tablet 3  . polyethylene glycol (MIRALAX) packet Take 17 g by mouth daily. (Patient not taking: Reported on 01/09/2017) 14 each 0  . sodium chloride irrigation 0.9 % irrigation USE TO CLEAN AROUND TRACH AND PERFORM TRACH CARE ONCE DAILY AND AS NEEDED 1000 mL 11  . triamcinolone (NASACORT AQ) 55 MCG/ACT AERO nasal inhaler Place 2 sprays into the nose daily. 1 Inhaler 12   Current Facility-Administered Medications  Medication Dose Route Frequency Provider Last Rate Last Dose  . guaiFENesin-dextromethorphan (ROBITUSSIN DM) 100-10 MG/5ML syrup 10 mL  10 mL Oral Q4H PRN Alvy Bimler, Sintia Mckissic, MD   10 mL at 01/09/17 1427   Facility-Administered Medications Ordered in Other Visits  Medication Dose Route Frequency Provider Last Rate Last Dose  . HYDROmorphone (DILAUDID) injection 2 mg  2 mg Intravenous Q2H PRN [name], MD   2 mg at 12/26/16 1440  . sodium chloride 0.9 % injection 10 mL  10 mL Intracatheter PRN [name], MD   10 mL at 01/02/17 1107  . sodium chloride flush (NS) 0.9 % injection 10 mL  10 mL Intracatheter PRN Alvy Bimler, Hoy Fallert, MD   10 mL at 12/26/16 1708    PHYSICAL EXAMINATION: ECOG PERFORMANCE STATUS: 1 - Symptomatic but completely ambulatory GENERAL:alert, no distress and comfortable SKIN: skin color, texture,  turgor are normal, no rashes or significant lesions EYES: normal, Conjunctiva are pink and non-injected, sclera clear OROPHARYNX:no exudate, no erythema and lips, buccal mucosa, and tongue normal  NECK: Tracheostomy site looks okay  LYMPH:  no palpable lymphadenopathy in the cervical, axillary or inguinal LUNGS: clear to auscultation and percussion with normal breathing effort  HEART: regular rate & rhythm and no murmurs and no lower extremity edema ABDOMEN:abdomen soft, non-tender and normal bowel sounds Musculoskeletal:no cyanosis of digits and no clubbing  NEURO: alert & oriented x 3 with fluent speech, no focal motor/sensory deficits  LABORATORY DATA:  I have reviewed the data as listed    Component Value Date/Time   NA 140 06/06/2017 1123   K 3.8 06/06/2017 1123   CL 99 (L) 12/27/2016 1748   CO2 26 06/06/2017 1123   GLUCOSE 106 06/06/2017 1123   BUN 10.9 06/06/2017 1123   CREATININE 0.8 06/06/2017 1123   CALCIUM 10.2 06/06/2017 1123   PROT 7.3 06/06/2017 1123   ALBUMIN 3.9 06/06/2017 1123   AST 13 06/06/2017 1123   ALT 11 06/06/2017 1123   ALKPHOS 61 06/06/2017 1123   BILITOT 0.40 06/06/2017 1123   GFRNONAA >60 12/27/2016 1748   GFRAA >60 12/27/2016 1748    No results found for: SPEP, UPEP  Lab Results  Component Value Date   WBC 9.0 06/06/2017   NEUTROABS 7.8 (H) 06/06/2017   HGB 13.5 06/06/2017   HCT 39.9 06/06/2017   MCV 98.7 (H) 06/06/2017   PLT 196 06/06/2017      Chemistry      Component Value Date/Time   NA 140 06/06/2017 1123   K 3.8 06/06/2017 1123   CL 99 (L) 12/27/2016 1748   CO2 26 06/06/2017 1123   BUN 10.9 06/06/2017 1123   CREATININE 0.8 06/06/2017 1123      Component Value Date/Time   CALCIUM 10.2 06/06/2017 1123   ALKPHOS 61 06/06/2017 1123   AST 13 06/06/2017 1123   ALT 11 06/06/2017 1123   BILITOT 0.40 06/06/2017 1123       RADIOGRAPHIC STUDIES: I have personally reviewed the radiological images as listed and agreed with the  findings in the report. Ct Soft Tissue Neck W Contrast  Result Date: 06/06/2017 CLINICAL DATA:  Squamous cell carcinoma supra glottis with malignant adenopathy. Assess response to treatment. EXAM: CT NECK WITH CONTRAST TECHNIQUE: Multidetector CT imaging of the neck was performed using the standard protocol following the bolus administration of intravenous contrast. CONTRAST:  65mL ISOVUE-300 IOPAMIDOL (ISOVUE-300) INJECTION 61% COMPARISON:  CT neck 02/10/2017 FINDINGS: Pharynx and larynx: Right supraglottic mass lesion appears slightly smaller, now measuring 23 x 16 mm. This extends into the piriform sinus and aryepiglottic fold on the right. Tracheostomy in satisfactory position. Salivary glands: Negative Thyroid: Right thyroid lobe normal. Left thyroid lobe may have been resected. Lymph nodes: Right level 2 node is smaller and now necrotic measuring approximately 8 mm. Improvement in left level 2 node. Vascular: Carotid artery and jugular vein patent bilaterally Limited intracranial: Negative Visualized orbits: Negative Mastoids and visualized paranasal sinuses: Mild mucosal edema in the maxillary sinus bilaterally. Skeleton: Accentuated dorsal kyphosis.  No focal bone lesion. Upper chest: Mild apical scarring bilaterally. Other: None IMPRESSION: Supraglottic tumor on the right is mildly smaller now measuring 23 x 16 mm. Improvement in bilateral cervical adenopathy. No new adenopathy identified. Electronically Signed   By: Franchot Gallo M.D.   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  All questions were answered. The patient knows to call the clinic with any problems, questions or concerns. No barriers to learning was detected. I spent 20 minutes counseling the patient face to face. The total time spent in the appointment was 25 minutes and more than 50% was on counseling and review of test results     [name], MD 06/07/2017 5:26 PM

## 2017-06-07 NOTE — Patient Instructions (Signed)
[facility] Discharge Instructions for Patients Receiving Chemotherapy  Today you received the following chemotherapy agents Taxol   To help prevent nausea and vomiting after your treatment, we encourage you to take your nausea medication as directed.   If you develop nausea and vomiting that is not controlled by your nausea medication, call the clinic.   BELOW ARE SYMPTOMS THAT SHOULD BE REPORTED IMMEDIATELY:  *FEVER GREATER THAN 100.5 F  *CHILLS WITH OR WITHOUT FEVER  NAUSEA AND VOMITING THAT IS NOT CONTROLLED WITH YOUR NAUSEA MEDICATION  *UNUSUAL SHORTNESS OF BREATH  *UNUSUAL BRUISING OR BLEEDING  TENDERNESS IN MOUTH AND THROAT WITH OR WITHOUT PRESENCE OF ULCERS  *URINARY PROBLEMS  *BOWEL PROBLEMS  UNUSUAL RASH Items with * indicate a potential emergency and should be followed up as soon as possible.  Feel free to call the clinic you have any questions or concerns. The clinic phone number is [phone].  Please show the CHEMO ALERT CARD at check-in to the Emergency Department and triage nurse.   

## 2017-06-08 ENCOUNTER — Ambulatory Visit: Payer: Medicaid Other

## 2017-06-08 NOTE — Progress Notes (Signed)
Pt arrived to [facility] and wanted to discuss the need for IV fluids.  He received chemo (taxol) yesterday as well as long acting antiemetics. He states he feels well today. No nausea/vomiting/diarrhea. Afebrile.  He would rather not get IV fluids if he doesn't need them.    We discussed his oral and PEG tube intake of fluids. Advised him that if he kept up with his fluid intake daily then he would not need the IV fluids.  Reviewed s/s of dehydration. He voiced understanding.    Pt decided not to get IV fluids and will call in the morning regarding IV fluids for tomorrow.    Dr. Alvy Bimler made aware and is ok with the above plan.  Pt to have endoscopy next Friday-06/16/17.  He is aware of that and will be keeping that appt.

## 2017-06-08 NOTE — Patient Instructions (Signed)
Dehydration, Adult Dehydration is a condition in which there is not enough fluid or water in the body. This happens when you lose more fluids than you take in. Important organs, such as the kidneys, brain, and heart, cannot function without a proper amount of fluids. Any loss of fluids from the body can lead to dehydration. Dehydration can range from mild to severe. This condition should be treated right away to prevent it from becoming severe. What are the causes? This condition may be caused by:  Vomiting.  Diarrhea.  Excessive sweating, such as from heat exposure or exercise.  Not drinking enough fluid, especially: ? When ill. ? While doing activity that requires a lot of energy.  Excessive urination.  Fever.  Infection.  Certain medicines, such as medicines that cause the body to lose excess fluid (diuretics).  Inability to access safe drinking water.  Reduced physical ability to get adequate water and food.  What increases the risk? This condition is more likely to develop in people:  Who have a poorly controlled long-term (chronic) illness, such as diabetes, heart disease, or kidney disease.  Who are age 65 or older.  Who are disabled.  Who live in a place with high altitude.  Who play endurance sports.  What are the signs or symptoms? Symptoms of mild dehydration may include:  Thirst.  Dry lips.  Slightly dry mouth.  Dry, warm skin.  Dizziness. Symptoms of moderate dehydration may include:  Very dry mouth.  Muscle cramps.  Dark urine. Urine may be the color of tea.  Decreased urine production.  Decreased tear production.  Heartbeat that is irregular or faster than normal (palpitations).  Headache.  Light-headedness, especially when you stand up from a sitting position.  Fainting (syncope). Symptoms of severe dehydration may include:  Changes in skin, such as: ? Cold and clammy skin. ? Blotchy (mottled) or pale skin. ? Skin that does  not quickly return to normal after being lightly pinched and released (poor skin turgor).  Changes in body fluids, such as: ? Extreme thirst. ? No tear production. ? Inability to sweat when body temperature is high, such as in hot weather. ? Very little urine production.  Changes in vital signs, such as: ? Weak pulse. ? Pulse that is more than 100 beats a minute when sitting still. ? Rapid breathing. ? Low blood pressure.  Other changes, such as: ? Sunken eyes. ? Cold hands and feet. ? Confusion. ? Lack of energy (lethargy). ? Difficulty waking up from sleep. ? Short-term weight loss. ? Unconsciousness. How is this diagnosed? This condition is diagnosed based on your symptoms and a physical exam. Blood and urine tests may be done to help confirm the diagnosis. How is this treated? Treatment for this condition depends on the severity. Mild or moderate dehydration can often be treated at home. Treatment should be started right away. Do not wait until dehydration becomes severe. Severe dehydration is an emergency and it needs to be treated in a hospital. Treatment for mild dehydration may include:  Drinking more fluids.  Replacing salts and minerals in your blood (electrolytes) that you may have lost. Treatment for moderate dehydration may include:  Drinking an oral rehydration solution (ORS). This is a drink that helps you replace fluids and electrolytes (rehydrate). It can be found at pharmacies and retail stores. Treatment for severe dehydration may include:  Receiving fluids through an IV tube.  Receiving an electrolyte solution through a feeding tube that is passed through your nose   and into your stomach (nasogastric tube, or NG tube).  Correcting any abnormalities in electrolytes.  Treating the underlying cause of dehydration. Follow these instructions at home:  If directed by your health care provider, drink an ORS: ? Make an ORS by following instructions on the  package. ? Start by drinking small amounts, about  cup (120 mL) every 5-10 minutes. ? Slowly increase how much you drink until you have taken the amount recommended by your health care provider.  Drink enough clear fluid to keep your urine clear or pale yellow. If you were told to drink an ORS, finish the ORS first, then start slowly drinking other clear fluids. Drink fluids such as: ? Water. Do not drink only water. Doing that can lead to having too little salt (sodium) in the body (hyponatremia). ? Ice chips. ? Fruit juice that you have added water to (diluted fruit juice). ? Low-calorie sports drinks.  Avoid: ? Alcohol. ? Drinks that contain a lot of sugar. These include high-calorie sports drinks, fruit juice that is not diluted, and soda. ? Caffeine. ? Foods that are greasy or contain a lot of fat or sugar.  Take over-the-counter and prescription medicines only as told by your health care provider.  Do not take sodium tablets. This can lead to having too much sodium in the body (hypernatremia).  Eat foods that contain a healthy balance of electrolytes, such as bananas, oranges, potatoes, tomatoes, and spinach.  Keep all follow-up visits as told by your health care provider. This is important. Contact a health care provider if:  You have abdominal pain that: ? Gets worse. ? Stays in one area (localizes).  You have a rash.  You have a stiff neck.  You are more irritable than usual.  You are sleepier or more difficult to wake up than usual.  You feel weak or dizzy.  You feel very thirsty.  You have urinated only a small amount of very dark urine over 6-8 hours. Get help right away if:  You have symptoms of severe dehydration.  You cannot drink fluids without vomiting.  Your symptoms get worse with treatment.  You have a fever.  You have a severe headache.  You have vomiting or diarrhea that: ? Gets worse. ? Does not go away.  You have blood or green matter  (bile) in your vomit.  You have blood in your stool. This may cause stool to look black and tarry.  You have not urinated in 6-8 hours.  You faint.  Your heart rate while sitting still is over 100 beats a minute.  You have trouble breathing. This information is not intended to replace advice given to you by your health care provider. Make sure you discuss any questions you have with your health care provider. Document Released: 11/28/2005 Document Revised: 06/24/2016 Document Reviewed: 01/22/2016 Elsevier Interactive Patient Education  2018 [employer]  

## 2017-06-09 ENCOUNTER — Ambulatory Visit: Payer: Self-pay

## 2017-06-15 ENCOUNTER — Encounter (HOSPITAL_COMMUNITY): Payer: Self-pay | Admitting: Certified Registered Nurse Anesthetist

## 2017-06-16 ENCOUNTER — Telehealth: Payer: Self-pay | Admitting: Gastroenterology

## 2017-06-16 ENCOUNTER — Ambulatory Visit (HOSPITAL_COMMUNITY): Admission: RE | Admit: 2017-06-16 | Payer: Medicaid Other | Source: Ambulatory Visit | Admitting: Gastroenterology

## 2017-06-16 ENCOUNTER — Encounter (HOSPITAL_COMMUNITY): Payer: Self-pay | Admitting: Certified Registered Nurse Anesthetist

## 2017-06-16 SURGERY — ESOPHAGOGASTRODUODENOSCOPY (EGD) WITH PROPOFOL
Anesthesia: Monitor Anesthesia Care

## 2017-06-16 NOTE — Telephone Encounter (Signed)
Left message on machine to call back  

## 2017-06-26 ENCOUNTER — Other Ambulatory Visit: Payer: Self-pay | Admitting: Hematology and Oncology

## 2017-06-26 ENCOUNTER — Ambulatory Visit: Payer: Self-pay

## 2017-06-26 ENCOUNTER — Telehealth: Payer: Self-pay | Admitting: Hematology and Oncology

## 2017-06-26 ENCOUNTER — Ambulatory Visit: Payer: Self-pay | Admitting: Hematology and Oncology

## 2017-06-26 ENCOUNTER — Other Ambulatory Visit: Payer: Self-pay

## 2017-06-26 NOTE — Telephone Encounter (Signed)
Scheduled appt per 7/16 sch message from Dr. Alvy Bimler - patient is aware of appts added and will pick up new schedule next visit.

## 2017-06-27 ENCOUNTER — Ambulatory Visit: Payer: Self-pay

## 2017-06-28 ENCOUNTER — Ambulatory Visit: Payer: Self-pay

## 2017-06-28 ENCOUNTER — Encounter (INDEPENDENT_AMBULATORY_CARE_PROVIDER_SITE_OTHER): Payer: Self-pay | Admitting: Physician Assistant

## 2017-06-28 ENCOUNTER — Ambulatory Visit (INDEPENDENT_AMBULATORY_CARE_PROVIDER_SITE_OTHER): Payer: Medicaid Other | Admitting: Physician Assistant

## 2017-06-28 VITALS — BP 112/77 | HR 95 | Temp 99.3°F | Ht 67.5 in | Wt 169.2 lb

## 2017-06-28 DIAGNOSIS — E039 Hypothyroidism, unspecified: Secondary | ICD-10-CM | POA: Diagnosis not present

## 2017-06-28 DIAGNOSIS — C321 Malignant neoplasm of supraglottis: Secondary | ICD-10-CM

## 2017-06-28 DIAGNOSIS — R202 Paresthesia of skin: Secondary | ICD-10-CM | POA: Diagnosis not present

## 2017-06-28 DIAGNOSIS — K117 Disturbances of salivary secretion: Secondary | ICD-10-CM

## 2017-06-28 MED ORDER — SCOPOLAMINE 1 MG/3DAYS TD PT72: 1.0000 | MEDICATED_PATCH | TRANSDERMAL | 12 refills | Status: DC

## 2017-06-28 MED FILL — TRANSDERM-SCOP 1.5 MG/3 DAY: 1 | 30 days supply | Qty: 10 | Fill #0

## 2017-06-28 NOTE — Progress Notes (Signed)
Subjective:  Patient ID: Kyle Alexander, male    DOB: 12/14/1960  Age: 56 y.o. MRN: [id number]  CC: new patient  HPI Kyle Alexander is a 56 y.o. male with a medical hx of throat cancer, HTN, hypothyroidism, GERD, and insomnia presents to establish a PCP. Says he is managed by oncology and is as well as can be expected. 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 Has feeding tube in place and is maintained clean and dry. Only complaints are of tingling and burning of the lower extremities bilaterally and of excessive salivation. Excessive salivation attributed to throat cancer. Does not endorse any other symptoms or complaints. Has a follow up appointment with Gastroenterology soon.      ROS Review of Systems  Constitutional: Negative for chills, fever and malaise/fatigue.  HENT:       Excessive salivation  Eyes: Negative for blurred vision.  Respiratory: Negative for shortness of breath.   Cardiovascular: Negative for chest pain and palpitations.  Gastrointestinal: Negative for abdominal pain and nausea.  Genitourinary: Negative for dysuria and hematuria.  Musculoskeletal: Negative for joint pain and myalgias.  Skin: Negative for rash.  Neurological: Positive for tingling. Negative for headaches.  Psychiatric/Behavioral: Negative for depression. The patient is not nervous/anxious.     Objective:  BP 112/77 (BP Location: Left Arm, Patient Position: Sitting, Cuff Size: Normal)   Pulse 95   Temp 99.3 F (37.4 C) (Oral)   Ht 5' 7.5" (1.715 m)   Wt 169 lb 3.2 oz (76.7 kg)   SpO2 99%   BMI 26.11 kg/m   BP/Weight 06/28/2017 06/08/2017 4/74/2595  Systolic BP 638 756 433  Diastolic BP 77 63 93  Wt. (Lbs) 169.2 172.1 -  BMI 26.11 25.79 -      Physical Exam  Constitutional: He is oriented to person, place, and time.  Well developed, well nourished, NAD, polite  HENT:  Head: Normocephalic and atraumatic.  Eyes: No scleral icterus.  Neck:  Normal range of motion. Neck supple. No thyromegaly present.  Cardiovascular: Normal rate, regular rhythm and normal heart sounds.   Pulmonary/Chest: Effort normal and breath sounds normal.  Abdominal: Soft. Bowel sounds are normal. There is no tenderness.  Feeding tube in place, appears well dressed, clean, and dry.  Musculoskeletal: He exhibits no edema or deformity.  Neurological: He is alert and oriented to person, place, and time. No cranial nerve deficit. Coordination normal.  Skin: Skin is warm and dry. No rash noted. No erythema. No pallor.  Psychiatric: He has a normal mood and affect. His behavior is normal. Thought content normal.  Vitals reviewed.    Assessment & Plan:   1. Paresthesia of both lower extremities - Vitamin B12 - Can also be attributed to peripheral neuropathy due to chemotherapy  2. Hypothyroidism, unspecified type - Thyroid Panel With TSH  3. 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HCC) - CBC with Differential - Comprehensive metabolic panel  4. Sialorrhea - scopolamine (TRANSDERM-SCOP, 1.5 MG,) 1 MG/3DAYS; Place 1 patch (1.5 mg total) onto the skin every 3 (three) days.  Dispense: 10 patch; Refill: 12   Meds ordered this encounter  Medications  . scopolamine (TRANSDERM-SCOP, 1.5 MG,) 1 MG/3DAYS    Sig: Place 1 patch (1.5 mg total) onto the skin every 3 (three) days.    Dispense:  10 patch    Refill:  12    Order Specific Question:   Supervising Provider    Answer:   Angelica Chessman  E [[id number]]    Follow-up: Return in about 6 months (around 12/29/2017) for Thyroid.   Clent Demark PA

## 2017-06-28 NOTE — Patient Instructions (Signed)
Hypothyroidism Hypothyroidism is a disorder of the thyroid. The thyroid is a large gland that is located in the lower front of the neck. The thyroid releases hormones that control how the body works. With hypothyroidism, the thyroid does not make enough of these hormones. What are the causes? Causes of hypothyroidism may include:  Viral infections.  Pregnancy.  Your own defense system (immune system) attacking your thyroid.  Certain medicines.  Birth defects.  Past radiation treatments to your head or neck.  Past treatment with radioactive iodine.  Past surgical removal of part or all of your thyroid.  Problems with the gland that is located in the center of your brain (pituitary).  What are the signs or symptoms? Signs and symptoms of hypothyroidism may include:  Feeling as though you have no energy (lethargy).  Inability to tolerate cold.  Weight gain that is not explained by a change in diet or exercise habits.  Dry skin.  Coarse hair.  Menstrual irregularity.  Slowing of thought processes.  Constipation.  Sadness or depression.  How is this diagnosed? Your health care provider may diagnose hypothyroidism with blood tests and ultrasound tests. How is this treated? Hypothyroidism is treated with medicine that replaces the hormones that your body does not make. After you begin treatment, it may take several weeks for symptoms to go away. Follow these instructions at home:  Take medicines only as directed by your health care provider.  If you start taking any new medicines, tell your health care provider.  Keep all follow-up visits as directed by your health care provider. This is important. As your condition improves, your dosage needs may change. You will need to have blood tests regularly so that your health care provider can watch your condition. Contact a health care provider if:  Your symptoms do not get better with treatment.  You are taking thyroid  replacement medicine and: ? You sweat excessively. ? You have tremors. ? You feel anxious. ? You lose weight rapidly. ? You cannot tolerate heat. ? You have emotional swings. ? You have diarrhea. ? You feel weak. Get help right away if:  You develop chest pain.  You develop an irregular heartbeat.  You develop a rapid heartbeat. This information is not intended to replace advice given to you by your health care provider. Make sure you discuss any questions you have with your health care provider. Document Released: 11/28/2005 Document Revised: 05/05/2016 Document Reviewed: 04/15/2014 Elsevier Interactive Patient Education  2017 [employer]vier Inc.  

## 2017-06-29 LAB — COMPREHENSIVE METABOLIC PANEL
ALK PHOS: 63 IU/L (ref 39–117)
ALT: 15 IU/L (ref 0–44)
AST: 13 IU/L (ref 0–40)
Albumin/Globulin Ratio: 1.7 (ref 1.2–2.2)
Albumin: 4.5 g/dL (ref 3.5–5.5)
BILIRUBIN TOTAL: 0.4 mg/dL (ref 0.0–1.2)
BUN/Creatinine Ratio: 17 (ref 9–20)
BUN: 15 mg/dL (ref 6–24)
CHLORIDE: 100 mmol/L (ref 96–106)
CO2: 26 mmol/L (ref 20–29)
Calcium: 9.9 mg/dL (ref 8.7–10.2)
Creatinine, Ser: 0.86 mg/dL (ref 0.76–1.27)
GFR calc Af Amer: 114 mL/min/{1.73_m2} (ref >59)
GFR calc non Af Amer: 98 mL/min/{1.73_m2} (ref >59)
GLUCOSE: 91 mg/dL (ref 65–99)
Globulin, Total: 2.6 g/dL (ref 1.5–4.5)
Potassium: 3.8 mmol/L (ref 3.5–5.2)
Sodium: 143 mmol/L (ref 134–144)
TOTAL PROTEIN: 7.1 g/dL (ref 6.0–8.5)

## 2017-06-29 LAB — CBC WITH DIFFERENTIAL/PLATELET
BASOS ABS: 0 10*3/uL (ref 0.0–0.2)
Basos: 0 %
EOS (ABSOLUTE): 0.1 10*3/uL (ref 0.0–0.4)
Eos: 1 %
Hematocrit: 40.4 % (ref 37.5–51.0)
Hemoglobin: 13.5 g/dL (ref 13.0–17.7)
IMMATURE GRANS (ABS): 0.1 10*3/uL (ref 0.0–0.1)
Immature Granulocytes: 1 %
LYMPHS: 6 %
Lymphocytes Absolute: 0.5 10*3/uL — ABNORMAL LOW (ref 0.7–3.1)
MCH: 32.2 pg (ref 26.6–33.0)
MCHC: 33.4 g/dL (ref 31.5–35.7)
MCV: 96 fL (ref 79–97)
Monocytes Absolute: 0.9 10*3/uL (ref 0.1–0.9)
Monocytes: 10 %
NEUTROS ABS: 7.1 10*3/uL — AB (ref 1.4–7.0)
Neutrophils: 82 %
PLATELETS: 205 10*3/uL (ref 150–379)
RBC: 4.19 x10E6/uL (ref 4.14–5.80)
RDW: 15.4 % (ref 12.3–15.4)
WBC: 8.7 10*3/uL (ref 3.4–10.8)

## 2017-06-29 LAB — THYROID PANEL WITH TSH
FREE THYROXINE INDEX: 2.4 (ref 1.2–4.9)
T3 UPTAKE RATIO: 33 % (ref 24–39)
T4, Total: 7.4 ug/dL (ref 4.5–12.0)
TSH: 0.804 u[IU]/mL (ref 0.450–4.500)

## 2017-06-29 LAB — VITAMIN B12: VITAMIN B 12: 959 pg/mL (ref 232–1245)

## 2017-06-29 MED FILL — ONDANSETRON HCL 8 MG TAB: 8 | 20 days supply | Qty: 60 | Fill #3

## 2017-07-06 MED FILL — LEVOTHYROXINE 100 MCG TAB: 100 | 30 days supply | Qty: 30 | Fill #4

## 2017-07-10 ENCOUNTER — Other Ambulatory Visit (HOSPITAL_BASED_OUTPATIENT_CLINIC_OR_DEPARTMENT_OTHER): Payer: Medicaid Other

## 2017-07-10 ENCOUNTER — Ambulatory Visit: Payer: Medicaid Other

## 2017-07-10 ENCOUNTER — Ambulatory Visit (HOSPITAL_BASED_OUTPATIENT_CLINIC_OR_DEPARTMENT_OTHER): Payer: Medicaid Other

## 2017-07-10 VITALS — BP 134/69 | HR 70 | Temp 99.1°F | Resp 16

## 2017-07-10 DIAGNOSIS — Z79899 Other long term (current) drug therapy: Secondary | ICD-10-CM

## 2017-07-10 DIAGNOSIS — E039 Hypothyroidism, unspecified: Secondary | ICD-10-CM

## 2017-07-10 DIAGNOSIS — C321 Malignant neoplasm of supraglottis: Secondary | ICD-10-CM

## 2017-07-10 DIAGNOSIS — Z5111 Encounter for antineoplastic chemotherapy: Secondary | ICD-10-CM | POA: Diagnosis present

## 2017-07-10 LAB — COMPREHENSIVE METABOLIC PANEL
ALK PHOS: 59 U/L (ref 40–150)
ALT: 8 U/L (ref 0–55)
AST: 12 U/L (ref 5–34)
Albumin: 3.8 g/dL (ref 3.5–5.0)
Anion Gap: 7 mEq/L (ref 3–11)
BILIRUBIN TOTAL: 0.31 mg/dL (ref 0.20–1.20)
BUN: 13.3 mg/dL (ref 7.0–26.0)
CALCIUM: 9.5 mg/dL (ref 8.4–10.4)
CO2: 29 mEq/L (ref 22–29)
CREATININE: 0.8 mg/dL (ref 0.7–1.3)
Chloride: 104 mEq/L (ref 98–109)
EGFR: >90 mL/min/{1.73_m2} (ref >90)
GLUCOSE: 89 mg/dL (ref 70–140)
POTASSIUM: 4.3 meq/L (ref 3.5–5.1)
SODIUM: 140 meq/L (ref 136–145)
TOTAL PROTEIN: 6.8 g/dL (ref 6.4–8.3)

## 2017-07-10 LAB — CBC WITH DIFFERENTIAL/PLATELET
BASO%: 0.2 % (ref 0.0–2.0)
BASOS ABS: 0 10*3/uL (ref 0.0–0.1)
EOS%: 0.9 % (ref 0.0–7.0)
Eosinophils Absolute: 0.1 10*3/uL (ref 0.0–0.5)
HEMATOCRIT: 38.5 % (ref 38.4–49.9)
HEMOGLOBIN: 13.1 g/dL (ref 13.0–17.1)
LYMPH#: 0.6 10*3/uL — AB (ref 0.9–3.3)
LYMPH%: 6.7 % — ABNORMAL LOW (ref 14.0–49.0)
MCH: 33.1 pg (ref 27.2–33.4)
MCHC: 34.1 g/dL (ref 32.0–36.0)
MCV: 97.1 fL (ref 79.3–98.0)
MONO#: 0.9 10*3/uL (ref 0.1–0.9)
MONO%: 9.9 % (ref 0.0–14.0)
NEUT%: 82.3 % — ABNORMAL HIGH (ref 39.0–75.0)
NEUTROS ABS: 7.5 10*3/uL — AB (ref 1.5–6.5)
Platelets: 209 10*3/uL (ref 140–400)
RBC: 3.96 10*6/uL — ABNORMAL LOW (ref 4.20–5.82)
RDW: 16 % — AB (ref 11.0–14.6)
WBC: 9.1 10*3/uL (ref 4.0–10.3)

## 2017-07-10 LAB — TSH: TSH: 1.155 m(IU)/L (ref 0.320–4.118)

## 2017-07-10 MED ORDER — FOSAPREPITANT DIMEGLUMINE INJECTION 150 MG
Freq: Once | INTRAVENOUS | Status: AC
  Administered 2017-07-10: 12:00:00 via INTRAVENOUS
  Filled 2017-07-10: qty 5

## 2017-07-10 MED ORDER — SODIUM CHLORIDE 0.9% FLUSH
10.0000 mL | INTRAVENOUS | Status: DC | PRN
  Administered 2017-07-10: 10 mL
  Filled 2017-07-10: qty 10

## 2017-07-10 MED ORDER — PALONOSETRON HCL INJECTION 0.25 MG/5ML
0.2500 mg | Freq: Once | INTRAVENOUS | Status: AC
  Administered 2017-07-10: 0.25 mg via INTRAVENOUS

## 2017-07-10 MED ORDER — SODIUM CHLORIDE 0.9 % IV SOLN
Freq: Once | INTRAVENOUS | Status: AC
  Administered 2017-07-10: 12:00:00 via INTRAVENOUS

## 2017-07-10 MED ORDER — SODIUM CHLORIDE 0.9% FLUSH
10.0000 mL | Freq: Once | INTRAVENOUS | Status: AC
  Administered 2017-07-10: 10 mL
  Filled 2017-07-10: qty 10

## 2017-07-10 MED ORDER — DIPHENHYDRAMINE HCL 50 MG/ML IJ SOLN
50.0000 mg | Freq: Once | INTRAMUSCULAR | Status: AC
  Administered 2017-07-10: 50 mg via INTRAVENOUS

## 2017-07-10 MED ORDER — FAMOTIDINE IN NACL 20-0.9 MG/50ML-% IV SOLN
20.0000 mg | Freq: Once | INTRAVENOUS | Status: AC
  Administered 2017-07-10: 20 mg via INTRAVENOUS

## 2017-07-10 MED ORDER — PALONOSETRON HCL INJECTION 0.25 MG/5ML
INTRAVENOUS | Status: AC
  Filled 2017-07-10: qty 5

## 2017-07-10 MED ORDER — FAMOTIDINE IN NACL 20-0.9 MG/50ML-% IV SOLN
INTRAVENOUS | Status: AC
  Filled 2017-07-10: qty 50

## 2017-07-10 MED ORDER — HEPARIN SOD (PORK) LOCK FLUSH 100 UNIT/ML IV SOLN
500.0000 [IU] | Freq: Once | INTRAVENOUS | Status: AC | PRN
  Administered 2017-07-10: 500 [IU]
  Filled 2017-07-10: qty 5

## 2017-07-10 MED ORDER — DIPHENHYDRAMINE HCL 50 MG/ML IJ SOLN
INTRAMUSCULAR | Status: AC
  Filled 2017-07-10: qty 1

## 2017-07-10 MED ORDER — PACLITAXEL CHEMO INJECTION 300 MG/50ML
80.0000 mg/m2 | Freq: Once | INTRAVENOUS | Status: AC
  Administered 2017-07-10: 156 mg via INTRAVENOUS
  Filled 2017-07-10: qty 26

## 2017-07-10 NOTE — Patient Instructions (Signed)

## 2017-07-10 NOTE — Patient Instructions (Signed)
[facility] Discharge Instructions for Patients Receiving Chemotherapy  Today you received the following chemotherapy agents Taxol   To help prevent nausea and vomiting after your treatment, we encourage you to take your nausea medication as directed.   If you develop nausea and vomiting that is not controlled by your nausea medication, call the clinic.   BELOW ARE SYMPTOMS THAT SHOULD BE REPORTED IMMEDIATELY:  *FEVER GREATER THAN 100.5 F  *CHILLS WITH OR WITHOUT FEVER  NAUSEA AND VOMITING THAT IS NOT CONTROLLED WITH YOUR NAUSEA MEDICATION  *UNUSUAL SHORTNESS OF BREATH  *UNUSUAL BRUISING OR BLEEDING  TENDERNESS IN MOUTH AND THROAT WITH OR WITHOUT PRESENCE OF ULCERS  *URINARY PROBLEMS  *BOWEL PROBLEMS  UNUSUAL RASH Items with * indicate a potential emergency and should be followed up as soon as possible.  Feel free to call the clinic you have any questions or concerns. The clinic phone number is [phone].  Please show the CHEMO ALERT CARD at check-in to the Emergency Department and triage nurse.   

## 2017-07-11 ENCOUNTER — Ambulatory Visit (HOSPITAL_BASED_OUTPATIENT_CLINIC_OR_DEPARTMENT_OTHER): Payer: Medicaid Other

## 2017-07-11 ENCOUNTER — Other Ambulatory Visit: Payer: Self-pay | Admitting: *Deleted

## 2017-07-11 ENCOUNTER — Ambulatory Visit: Payer: Medicaid Other

## 2017-07-11 VITALS — BP 118/68 | HR 97 | Temp 98.9°F | Resp 20 | Ht 67.5 in

## 2017-07-11 DIAGNOSIS — C321 Malignant neoplasm of supraglottis: Secondary | ICD-10-CM

## 2017-07-11 DIAGNOSIS — G893 Neoplasm related pain (acute) (chronic): Secondary | ICD-10-CM

## 2017-07-11 MED ORDER — MORPHINE SULFATE ER 60 MG PO TBCR: 60.0000 mg | EXTENDED_RELEASE_TABLET | Freq: Two times a day (BID) | ORAL | 0 refills | Status: DC

## 2017-07-11 MED ORDER — HEPARIN SOD (PORK) LOCK FLUSH 100 UNIT/ML IV SOLN
500.0000 [IU] | Freq: Once | INTRAVENOUS | Status: AC | PRN
  Administered 2017-07-11: 500 [IU]
  Filled 2017-07-11: qty 5

## 2017-07-11 MED ORDER — HYDROMORPHONE HCL 2 MG/ML IJ SOLN
2.0000 mg | Freq: Once | INTRAMUSCULAR | Status: AC
  Administered 2017-07-11: 2 mg via INTRAVENOUS
  Filled 2017-07-11: qty 1

## 2017-07-11 MED ORDER — SODIUM CHLORIDE 0.9 % IV SOLN
Freq: Once | INTRAVENOUS | Status: AC
  Administered 2017-07-11: 13:00:00 via INTRAVENOUS

## 2017-07-11 MED ORDER — MORPHINE SULFATE 30 MG PO TABS: 60.0000 mg | ORAL_TABLET | ORAL | 0 refills | Status: DC | PRN

## 2017-07-11 MED ORDER — SODIUM CHLORIDE 0.9 % IJ SOLN
10.0000 mL | INTRAMUSCULAR | Status: DC | PRN
  Administered 2017-07-11: 10 mL
  Filled 2017-07-11: qty 10

## 2017-07-11 MED ORDER — MORPHINE SULFATE ER 60 MG PO TBCR
60.0000 mg | EXTENDED_RELEASE_TABLET | Freq: Two times a day (BID) | ORAL | 0 refills | Status: DC
Start: 2017-07-11 — End: 2017-08-24

## 2017-07-11 MED ORDER — HYDROMORPHONE HCL 2 MG/ML IJ SOLN
INTRAMUSCULAR | Status: AC
  Filled 2017-07-11: qty 1

## 2017-07-11 MED FILL — MORPHINE SULF 60 MG TAB SA: 60 | 30 days supply | Qty: 60 | Fill #0

## 2017-07-11 MED FILL — MORPHINE SULFATE IR 30 MG T: 30 | 8 days supply | Qty: 90 | Fill #0

## 2017-07-11 NOTE — Progress Notes (Signed)
Nutrition Follow-up:  Nutrition follow-up completed in infusion this pm.  Planning to receive IV fluids today. Patient being treated for supraglottis cancer and receiving chemotherapy.  Patient reports that his tube feeding is the same as always.  No change or problems.  Continues with 3 feedings of 2 cans everyday (osmoite and jevity 1.5) for total of 6 cans per day.  Patient reports no heartburn recently.  Was able to see GI but not able to keep scheduled EGD. Reports that he has to get that rescheduled.  Reports tube is working well and has just received new shipment of tube feeding and supplies.  Reports no change in bowel habits.     Medications: reviewed  Labs: reviewed  Anthropometrics:   Weight per patient report 173 lb 4 oz today.  Noted stable weight between 168-172 lb   NUTRITION DIAGNOSIS: Unintentional weight loss stable   INTERVENTION:  Continue with current tube feeding regimen at this time.     MONITORING, EVALUATION, GOAL: Patient will continue to tolerate tube feeding for weight maintenance/gain   NEXT VISIT: as needed, with infusions  [place] Krist B. Zenia Resides, [place], [place] Registered Dietitian [phone] (pager)

## 2017-07-12 ENCOUNTER — Other Ambulatory Visit: Payer: Self-pay | Admitting: *Deleted

## 2017-07-12 ENCOUNTER — Ambulatory Visit (HOSPITAL_BASED_OUTPATIENT_CLINIC_OR_DEPARTMENT_OTHER): Payer: Medicaid Other

## 2017-07-12 VITALS — BP 116/73 | HR 87 | Temp 98.4°F | Resp 18 | Ht 67.5 in | Wt 175.9 lb

## 2017-07-12 DIAGNOSIS — R0981 Nasal congestion: Secondary | ICD-10-CM

## 2017-07-12 DIAGNOSIS — C321 Malignant neoplasm of supraglottis: Secondary | ICD-10-CM

## 2017-07-12 MED ORDER — SODIUM CHLORIDE 0.9 % IJ SOLN
10.0000 mL | INTRAMUSCULAR | Status: DC | PRN
  Administered 2017-07-12: 10 mL
  Filled 2017-07-12: qty 10

## 2017-07-12 MED ORDER — HEPARIN SOD (PORK) LOCK FLUSH 100 UNIT/ML IV SOLN
500.0000 [IU] | Freq: Once | INTRAVENOUS | Status: AC | PRN
  Administered 2017-07-12: 500 [IU]
  Filled 2017-07-12: qty 5

## 2017-07-12 MED ORDER — FLUTICASONE PROPIONATE 50 MCG/ACT NA SUSP: 2.0000 | Freq: Every day | NASAL | 2 refills | Status: DC

## 2017-07-12 MED ORDER — SODIUM CHLORIDE 0.9 % IV SOLN
Freq: Once | INTRAVENOUS | Status: AC
  Administered 2017-07-12: 11:00:00 via INTRAVENOUS

## 2017-07-12 NOTE — Patient Instructions (Signed)
Dehydration, Adult Dehydration is a condition in which there is not enough fluid or water in the body. This happens when you lose more fluids than you take in. Important organs, such as the kidneys, brain, and heart, cannot function without a proper amount of fluids. Any loss of fluids from the body can lead to dehydration. Dehydration can range from mild to severe. This condition should be treated right away to prevent it from becoming severe. What are the causes? This condition may be caused by:  Vomiting.  Diarrhea.  Excessive sweating, such as from heat exposure or exercise.  Not drinking enough fluid, especially: ? When ill. ? While doing activity that requires a lot of energy.  Excessive urination.  Fever.  Infection.  Certain medicines, such as medicines that cause the body to lose excess fluid (diuretics).  Inability to access safe drinking water.  Reduced physical ability to get adequate water and food.  What increases the risk? This condition is more likely to develop in people:  Who have a poorly controlled long-term (chronic) illness, such as diabetes, heart disease, or kidney disease.  Who are age 65 or older.  Who are disabled.  Who live in a place with high altitude.  Who play endurance sports.  What are the signs or symptoms? Symptoms of mild dehydration may include:  Thirst.  Dry lips.  Slightly dry mouth.  Dry, warm skin.  Dizziness. Symptoms of moderate dehydration may include:  Very dry mouth.  Muscle cramps.  Dark urine. Urine may be the color of tea.  Decreased urine production.  Decreased tear production.  Heartbeat that is irregular or faster than normal (palpitations).  Headache.  Light-headedness, especially when you stand up from a sitting position.  Fainting (syncope). Symptoms of severe dehydration may include:  Changes in skin, such as: ? Cold and clammy skin. ? Blotchy (mottled) or pale skin. ? Skin that does  not quickly return to normal after being lightly pinched and released (poor skin turgor).  Changes in body fluids, such as: ? Extreme thirst. ? No tear production. ? Inability to sweat when body temperature is high, such as in hot weather. ? Very little urine production.  Changes in vital signs, such as: ? Weak pulse. ? Pulse that is more than 100 beats a minute when sitting still. ? Rapid breathing. ? Low blood pressure.  Other changes, such as: ? Sunken eyes. ? Cold hands and feet. ? Confusion. ? Lack of energy (lethargy). ? Difficulty waking up from sleep. ? Short-term weight loss. ? Unconsciousness. How is this diagnosed? This condition is diagnosed based on your symptoms and a physical exam. Blood and urine tests may be done to help confirm the diagnosis. How is this treated? Treatment for this condition depends on the severity. Mild or moderate dehydration can often be treated at home. Treatment should be started right away. Do not wait until dehydration becomes severe. Severe dehydration is an emergency and it needs to be treated in a hospital. Treatment for mild dehydration may include:  Drinking more fluids.  Replacing salts and minerals in your blood (electrolytes) that you may have lost. Treatment for moderate dehydration may include:  Drinking an oral rehydration solution (ORS). This is a drink that helps you replace fluids and electrolytes (rehydrate). It can be found at pharmacies and retail stores. Treatment for severe dehydration may include:  Receiving fluids through an IV tube.  Receiving an electrolyte solution through a feeding tube that is passed through your nose   and into your stomach (nasogastric tube, or NG tube).  Correcting any abnormalities in electrolytes.  Treating the underlying cause of dehydration. Follow these instructions at home:  If directed by your health care provider, drink an ORS: ? Make an ORS by following instructions on the  package. ? Start by drinking small amounts, about  cup (120 mL) every 5-10 minutes. ? Slowly increase how much you drink until you have taken the amount recommended by your health care provider.  Drink enough clear fluid to keep your urine clear or pale yellow. If you were told to drink an ORS, finish the ORS first, then start slowly drinking other clear fluids. Drink fluids such as: ? Water. Do not drink only water. Doing that can lead to having too little salt (sodium) in the body (hyponatremia). ? Ice chips. ? Fruit juice that you have added water to (diluted fruit juice). ? Low-calorie sports drinks.  Avoid: ? Alcohol. ? Drinks that contain a lot of sugar. These include high-calorie sports drinks, fruit juice that is not diluted, and soda. ? Caffeine. ? Foods that are greasy or contain a lot of fat or sugar.  Take over-the-counter and prescription medicines only as told by your health care provider.  Do not take sodium tablets. This can lead to having too much sodium in the body (hypernatremia).  Eat foods that contain a healthy balance of electrolytes, such as bananas, oranges, potatoes, tomatoes, and spinach.  Keep all follow-up visits as told by your health care provider. This is important. Contact a health care provider if:  You have abdominal pain that: ? Gets worse. ? Stays in one area (localizes).  You have a rash.  You have a stiff neck.  You are more irritable than usual.  You are sleepier or more difficult to wake up than usual.  You feel weak or dizzy.  You feel very thirsty.  You have urinated only a small amount of very dark urine over 6-8 hours. Get help right away if:  You have symptoms of severe dehydration.  You cannot drink fluids without vomiting.  Your symptoms get worse with treatment.  You have a fever.  You have a severe headache.  You have vomiting or diarrhea that: ? Gets worse. ? Does not go away.  You have blood or green matter  (bile) in your vomit.  You have blood in your stool. This may cause stool to look black and tarry.  You have not urinated in 6-8 hours.  You faint.  Your heart rate while sitting still is over 100 beats a minute.  You have trouble breathing. This information is not intended to replace advice given to you by your health care provider. Make sure you discuss any questions you have with your health care provider. Document Released: 11/28/2005 Document Revised: 06/24/2016 Document Reviewed: 01/22/2016 Elsevier Interactive Patient Education  2018 [employer]  

## 2017-07-18 ENCOUNTER — Other Ambulatory Visit: Payer: Self-pay | Admitting: Hematology and Oncology

## 2017-07-18 ENCOUNTER — Telehealth: Payer: Self-pay | Admitting: *Deleted

## 2017-07-18 DIAGNOSIS — R600 Localized edema: Secondary | ICD-10-CM

## 2017-07-18 NOTE — Telephone Encounter (Signed)
He needs to see Dr. [name] to address the neck & throat ASAP. Please see if Liliane Channel and help navigate He needs to cut back on oral intake if already has leg swelling and voiding every hour

## 2017-07-18 NOTE — Telephone Encounter (Signed)
Notified of message below. Pt will call Dr Warren Danes to get in ASAP. Rick to call as well

## 2017-07-18 NOTE — Telephone Encounter (Signed)
Pt states his feet have gotten very swollen over weekend. Has tried elevating and is drinking a lot of fluid- voiding every hour. Is also concerned because his neck is hurting and throat is hurting "real bad" .

## 2017-07-20 MED FILL — OMEPRAZOLE 20 MG CAP: 20 | 30 days supply | Qty: 60 | Fill #4

## 2017-07-21 MED FILL — FLUTICASONE PROP 50 MCG SPR: 50 | 30 days supply | Qty: 16 | Fill #0

## 2017-07-24 ENCOUNTER — Ambulatory Visit (HOSPITAL_BASED_OUTPATIENT_CLINIC_OR_DEPARTMENT_OTHER): Payer: Medicaid Other | Admitting: Hematology and Oncology

## 2017-07-24 ENCOUNTER — Telehealth: Payer: Self-pay | Admitting: Hematology and Oncology

## 2017-07-24 ENCOUNTER — Ambulatory Visit (HOSPITAL_BASED_OUTPATIENT_CLINIC_OR_DEPARTMENT_OTHER): Payer: Medicaid Other

## 2017-07-24 ENCOUNTER — Other Ambulatory Visit (HOSPITAL_BASED_OUTPATIENT_CLINIC_OR_DEPARTMENT_OTHER): Payer: Medicaid Other

## 2017-07-24 ENCOUNTER — Ambulatory Visit: Payer: Medicaid Other

## 2017-07-24 DIAGNOSIS — Z430 Encounter for attention to tracheostomy: Secondary | ICD-10-CM

## 2017-07-24 DIAGNOSIS — R600 Localized edema: Secondary | ICD-10-CM

## 2017-07-24 DIAGNOSIS — G893 Neoplasm related pain (acute) (chronic): Secondary | ICD-10-CM

## 2017-07-24 DIAGNOSIS — C321 Malignant neoplasm of supraglottis: Secondary | ICD-10-CM

## 2017-07-24 DIAGNOSIS — Z5111 Encounter for antineoplastic chemotherapy: Secondary | ICD-10-CM

## 2017-07-24 LAB — CBC WITH DIFFERENTIAL/PLATELET
BASO%: 0.2 % (ref 0.0–2.0)
Basophils Absolute: 0 10*3/uL (ref 0.0–0.1)
EOS ABS: 0.1 10*3/uL (ref 0.0–0.5)
EOS%: 0.5 % (ref 0.0–7.0)
HCT: 39.4 % (ref 38.4–49.9)
HEMOGLOBIN: 13.4 g/dL (ref 13.0–17.1)
LYMPH%: 5.8 % — AB (ref 14.0–49.0)
MCH: 32.9 pg (ref 27.2–33.4)
MCHC: 33.9 g/dL (ref 32.0–36.0)
MCV: 97.2 fL (ref 79.3–98.0)
MONO#: 0.7 10*3/uL (ref 0.1–0.9)
MONO%: 6.7 % (ref 0.0–14.0)
NEUT%: 86.8 % — ABNORMAL HIGH (ref 39.0–75.0)
NEUTROS ABS: 8.6 10*3/uL — AB (ref 1.5–6.5)
PLATELETS: 200 10*3/uL (ref 140–400)
RBC: 4.05 10*6/uL — AB (ref 4.20–5.82)
RDW: 15.9 % — AB (ref 11.0–14.6)
WBC: 9.9 10*3/uL (ref 4.0–10.3)
lymph#: 0.6 10*3/uL — ABNORMAL LOW (ref 0.9–3.3)

## 2017-07-24 LAB — COMPREHENSIVE METABOLIC PANEL
ALBUMIN: 3.8 g/dL (ref 3.5–5.0)
ALK PHOS: 65 U/L (ref 40–150)
ALT: 14 U/L (ref 0–55)
AST: 14 U/L (ref 5–34)
Anion Gap: 9 mEq/L (ref 3–11)
BILIRUBIN TOTAL: 0.5 mg/dL (ref 0.20–1.20)
BUN: 12.8 mg/dL (ref 7.0–26.0)
CO2: 28 meq/L (ref 22–29)
CREATININE: 0.8 mg/dL (ref 0.7–1.3)
Calcium: 10.1 mg/dL (ref 8.4–10.4)
Chloride: 105 mEq/L (ref 98–109)
GLUCOSE: 97 mg/dL (ref 70–140)
Potassium: 4 mEq/L (ref 3.5–5.1)
SODIUM: 142 meq/L (ref 136–145)
TOTAL PROTEIN: 7 g/dL (ref 6.4–8.3)

## 2017-07-24 MED ORDER — PALONOSETRON HCL INJECTION 0.25 MG/5ML
0.2500 mg | Freq: Once | INTRAVENOUS | Status: AC
  Administered 2017-07-24: 0.25 mg via INTRAVENOUS

## 2017-07-24 MED ORDER — SODIUM CHLORIDE 0.9 % IJ SOLN
10.0000 mL | INTRAMUSCULAR | Status: DC | PRN
  Administered 2017-07-24: 10 mL
  Filled 2017-07-24: qty 10

## 2017-07-24 MED ORDER — FAMOTIDINE IN NACL 20-0.9 MG/50ML-% IV SOLN
INTRAVENOUS | Status: AC
  Filled 2017-07-24: qty 50

## 2017-07-24 MED ORDER — HYDROMORPHONE HCL 1 MG/ML IJ SOLN
1.0000 mg | Freq: Once | INTRAMUSCULAR | Status: AC
  Administered 2017-07-24: 1 mg via INTRAVENOUS

## 2017-07-24 MED ORDER — DIPHENHYDRAMINE HCL 50 MG/ML IJ SOLN
50.0000 mg | Freq: Once | INTRAMUSCULAR | Status: AC
  Administered 2017-07-24: 50 mg via INTRAVENOUS

## 2017-07-24 MED ORDER — HEPARIN SOD (PORK) LOCK FLUSH 100 UNIT/ML IV SOLN
500.0000 [IU] | Freq: Once | INTRAVENOUS | Status: AC | PRN
  Administered 2017-07-24: 500 [IU]
  Filled 2017-07-24: qty 5

## 2017-07-24 MED ORDER — PALONOSETRON HCL INJECTION 0.25 MG/5ML
INTRAVENOUS | Status: AC
  Filled 2017-07-24: qty 5

## 2017-07-24 MED ORDER — PACLITAXEL CHEMO INJECTION 300 MG/50ML
80.0000 mg/m2 | Freq: Once | INTRAVENOUS | Status: AC
  Administered 2017-07-24: 156 mg via INTRAVENOUS
  Filled 2017-07-24: qty 26

## 2017-07-24 MED ORDER — DIPHENHYDRAMINE HCL 50 MG/ML IJ SOLN
INTRAMUSCULAR | Status: AC
  Filled 2017-07-24: qty 1

## 2017-07-24 MED ORDER — HYDROMORPHONE HCL 1 MG/ML IJ SOLN
INTRAMUSCULAR | Status: AC
  Filled 2017-07-24: qty 1

## 2017-07-24 MED ORDER — HYDROMORPHONE HCL 4 MG/ML IJ SOLN
1.0000 mg | Freq: Once | INTRAMUSCULAR | Status: DC
  Filled 2017-07-24: qty 1

## 2017-07-24 MED ORDER — FAMOTIDINE IN NACL 20-0.9 MG/50ML-% IV SOLN
20.0000 mg | Freq: Once | INTRAVENOUS | Status: AC
  Administered 2017-07-24: 20 mg via INTRAVENOUS

## 2017-07-24 MED ORDER — SODIUM CHLORIDE 0.9 % IV SOLN
Freq: Once | INTRAVENOUS | Status: AC
  Administered 2017-07-24: 15:00:00 via INTRAVENOUS
  Filled 2017-07-24: qty 5

## 2017-07-24 MED ORDER — SODIUM CHLORIDE 0.9% FLUSH
10.0000 mL | INTRAVENOUS | Status: DC | PRN
  Administered 2017-07-24: 10 mL
  Filled 2017-07-24: qty 10

## 2017-07-24 MED ORDER — SODIUM CHLORIDE 0.9 % IV SOLN
Freq: Once | INTRAVENOUS | Status: AC
  Administered 2017-07-24: 14:00:00 via INTRAVENOUS

## 2017-07-24 NOTE — Patient Instructions (Signed)
[facility] Discharge Instructions for Patients Receiving Chemotherapy  Today you received the following chemotherapy agents Taxol   To help prevent nausea and vomiting after your treatment, we encourage you to take your nausea medication as directed.   If you develop nausea and vomiting that is not controlled by your nausea medication, call the clinic.   BELOW ARE SYMPTOMS THAT SHOULD BE REPORTED IMMEDIATELY:  *FEVER GREATER THAN 100.5 F  *CHILLS WITH OR WITHOUT FEVER  NAUSEA AND VOMITING THAT IS NOT CONTROLLED WITH YOUR NAUSEA MEDICATION  *UNUSUAL SHORTNESS OF BREATH  *UNUSUAL BRUISING OR BLEEDING  TENDERNESS IN MOUTH AND THROAT WITH OR WITHOUT PRESENCE OF ULCERS  *URINARY PROBLEMS  *BOWEL PROBLEMS  UNUSUAL RASH Items with * indicate a potential emergency and should be followed up as soon as possible.  Feel free to call the clinic you have any questions or concerns. The clinic phone number is [phone].  Please show the CHEMO ALERT CARD at check-in to the Emergency Department and triage nurse.   

## 2017-07-24 NOTE — Telephone Encounter (Signed)
Scheduled appt per 8/13 los - Gave patient AVS and calender per los.  

## 2017-07-25 ENCOUNTER — Encounter: Payer: Self-pay | Admitting: Hematology and Oncology

## 2017-07-25 ENCOUNTER — Telehealth: Payer: Self-pay

## 2017-07-25 ENCOUNTER — Ambulatory Visit: Payer: Self-pay

## 2017-07-25 LAB — BRAIN NATRIURETIC PEPTIDE: BNP: 31.6 pg/mL (ref 0.0–100.0)

## 2017-07-25 NOTE — Progress Notes (Signed)
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[place])   03/15/2015 - 03/17/2015 Hospital Admission    He was admitted to the hospital for evaluation of dysphagia, SOB, hemoptosis, hoarseness, 30-40 pound weight loss and worsening bilateral neck masses for 5 months      03/15/2015 Imaging    Ct showed extensive circumferential malignancy in the hypopharyngeal/supraglottic region with regional LN metastases      03/16/2015 Procedure    He underwent ULTRASOUND-GUIDED BIOPSY OF LEFT CERVICAL LYMPH NODES      03/16/2015 Pathology Results    Accession: [id number] LN biopsy showed invasive squamous cell cancer      03/16/2015 Pathology Results    Accession: [id number] showed atypical squamous cells      03/25/2015 - 04/07/2015 Hospital Admission    He was admitted to the hospital and underwent tracheostomy placement, feeeding tube placement but subsequently left [facility]      03/26/2015 Surgery    He had multiple extraction of tooth numbers 1, 2, 5, 6, 7, 8, 9, 10, 11, 12, 13, 18, 19, 21, 22, 23, 24, 25, 26, 27, 28, and 29. and 4 Quadrants of alveoloplasty      03/26/2015 Surgery    He underwent tracheostomy      03/31/2015 Surgery    He had open gastrostomy tube placement by Dr. Donne Hazel      04/16/2015 - 05/18/2015 Radiation Therapy    Laryngopharynx and bilateral neck / 50 Gy in 20 fractions to gross disease, 45 Gy in 20 fractions to high risk nodal echelons  Beams/energy: Helical IMRT / 6 MV photons      04/16/2015 Procedure    Fluoroscopic reposition of the 18 French gastrostomy confirmed back in the stomach,       07/03/2015 Procedure    IR performed replacement of gastrostomy tube with a new 68 French balloon retention tube      10/01/2015 Imaging    PEt scan showed persistent hypermetabolism within the primary supraglottic laryngeal tumor and within right retropharyngeal, bilateral level II and left level IV cervical nodal metastases      10/28/2015 Procedure    He had placement of PICC line. The IR was not able to place PORT due to suspected upper respiratory infection      11/13/2015 - 03/21/2016 Chemotherapy    He received 5FU, carboplatin chemo with weekly Erbitux      11/19/2015 Surgery    Gastrostomy tube replaced.      11/30/2015 Procedure    Placement of right jugular port-a-cath.      11/30/2015 Procedure    PICC removed.      12/30/2015 Imaging    PET CT showed positive response to Rx      02/26/2016 Procedure    He underwent direct laryngoscopy with biopsy. Esophageal dilatation.       02/26/2016 Pathology Results    Repeat biopsy of supraglottis showed persistent disease      02/29/2016 Procedure    Gastrostomy tube exchanged.      03/28/2016 PET scan    Hypermetabolic tissue in the posterior RIGHT hypopharynx is similar in pattern to PET-CT of 12/30/2015 but increased in metabolic activity.2. Hypermetabolic  tissue / lymph nodes in the LEFT supraclavicular nodal station are in a similar pattern       04/04/2016 - 11/29/2016 Chemotherapy    He started on palliative chemotherapy with pembrolizumab      05/03/2016 Imaging    MRI head is negative      06/02/2016 Imaging    Mild improvement of diffuse pharyngeal and supraglottic edema.Increased asymmetry of right-sided hypopharyngeal/supraglottic softtissue compared to the prior neck CT with FDG uptake in this region on prior PET-CT. Residual/recurrent tumor is possible      08/09/2016 Procedure    IR placed new 20 French percutaneous gastrostomy tube.      08/26/2016 Imaging    Ct neck showed unchanged diffuse  pharyngeal and supraglottic edema as well as asymmetric soft tissue thickening on the right. Slightly decreased size of some left level II lymph nodes. Unchanged lymphadenopathy elsewhere in the neck. Decreased size of thyroid mass. No evidence of metastatic cancer to the chest      12/13/2016 Imaging    Ct chest showed no evidence of thoracic metastatic disease or primary thoracic malignancy.      12/13/2016 Imaging    Ct neck showed progression of of RIGHT supraglottic mass compared with priors, approximate size 24 x 27 x 27 mm. Extension caudally along the RIGHT area epiglottic fold with increasing mass effect on the airway. Tracheostomy satisfactory position. Stable to slightly improved malignant adenopathy.      12/26/2016 -  Chemotherapy    He received chemotherapy with weekly Taxol       02/10/2017 Imaging    Very similar appearance to the study of January. Right sided supraglottic to glottic mass is similar, perhaps a few mm smaller. Bilateral enlarged nodes appear the same. No progressive finding      02/10/2017 Imaging    No evidence of metastatic disease in the abdomen/pelvis. Percutaneous gastrostomy in satisfactory position. Cholelithiasis, without associated inflammatory changes.      06/06/2017 Imaging    CT neck: Supraglottic tumor on the right is mildly smaller now measuring 23 x 16 mm. Improvement in bilateral cervical adenopathy. No new adenopathy identified       INTERVAL HISTORY: Please see below for problem oriented charting. He returns for further follow-up He complained of recent leg swelling, thought to be related to excessive IV fluid hydration Since then, his leg swelling has resolved His main complaint is related to significant tightness and discomfort around the tracheostomy site He denies bleeding or signs of infection/discharge from the tracheostomy He is taking morphine sulfate as needed He denies constipation no recent nausea No new  lymphadenopathy  REVIEW OF SYSTEMS:   Constitutional: Denies fevers, chills or abnormal weight loss Eyes: Denies blurriness of vision Ears, nose, mouth, throat, and face: Denies mucositis or sore throat Respiratory: Denies cough, dyspnea or wheezes Cardiovascular: Denies palpitation, chest discomfort  Gastrointestinal:  Denies nausea, heartburn or change in bowel habits Skin: Denies abnormal skin rashes Lymphatics: Denies new lymphadenopathy or easy bruising Neurological:Denies numbness, tingling or new weaknesses Behavioral/Psych: Mood is stable, no new changes  All other systems were reviewed with the patient and are negative.  I have reviewed the past medical history, past surgical history, social history and family history with the patient and they are unchanged from previous note.  ALLERGIES:  is allergic to aspirin.  MEDICATIONS:  Current Outpatient Prescriptions  Medication Sig Dispense Refill  . fluticasone (FLONASE) 50 MCG/ACT nasal spray Place 2 sprays into both nostrils daily. 16 g  2  . levothyroxine (SYNTHROID) 100 MCG tablet Take 1 tablet (100 mcg total) by mouth daily before breakfast. 30 tablet 9  . morphine (MS CONTIN) 60 MG 12 hr tablet Take 1 tablet (60 mg total) by mouth every 12 (twelve) hours. 60 tablet 0  . morphine (MSIR) 30 MG tablet Take 2 tablets (60 mg total) by mouth every 4 (four) hours as needed for severe pain. 90 tablet 0  . Nutritional Supplements (FEEDING SUPPLEMENT, JEVITY 1.5 CAL/FIBER,) LIQD Give 1 can Osmolite 1.2 + 1 can of Jevity 1.5 QID via PEG with 60 cc free water before and after bolus. Flush with 240 cc free water BID between feedings. 948 mL   . omeprazole (PRILOSEC) 40 MG capsule Take 1 capsule (40 mg total) by mouth daily. 90 capsule 3  . ondansetron (ZOFRAN) 8 MG tablet Take 1 tablet (8 mg total) by mouth every 8 (eight) hours as needed for nausea. 60 tablet 3  . scopolamine (TRANSDERM-SCOP, 1.5 MG,) 1 MG/3DAYS Place 1 patch (1.5 mg total)  onto the skin every 3 (three) days. 10 patch 12  . sodium chloride irrigation 0.9 % irrigation USE TO CLEAN AROUND TRACH AND PERFORM TRACH CARE ONCE DAILY AND AS NEEDED 1000 mL 11  . triamcinolone (NASACORT AQ) 55 MCG/ACT AERO nasal inhaler Place 2 sprays into the nose daily. 1 Inhaler 12   Current Facility-Administered Medications  Medication Dose Route Frequency Provider Last Rate Last Dose  . guaiFENesin-dextromethorphan (ROBITUSSIN DM) 100-10 MG/5ML syrup 10 mL  10 mL Oral Q4H PRN Alvy Bimler, Latanga Nedrow, MD   10 mL at 01/09/17 1427   Facility-Administered Medications Ordered in Other Visits  Medication Dose Route Frequency Provider Last Rate Last Dose  . HYDROmorphone (DILAUDID) injection 2 mg  2 mg Intravenous Q2H PRN [name], MD   2 mg at 12/26/16 1440  . sodium chloride 0.9 % injection 10 mL  10 mL Intracatheter PRN [name], MD   10 mL at 01/02/17 1107  . sodium chloride flush (NS) 0.9 % injection 10 mL  10 mL Intracatheter PRN Alvy Bimler, Rockelle Heuerman, MD   10 mL at 12/26/16 1708  . sodium chloride flush (NS) 0.9 % injection 10 mL  10 mL Intracatheter PRN Alvy Bimler, Gevon Markus, MD   10 mL at 07/24/17 1704    PHYSICAL EXAMINATION: ECOG PERFORMANCE STATUS: 1 - Symptomatic but completely ambulatory  Vitals:   07/24/17 1308  BP: 116/76  Pulse: 79  Resp: 18  Temp: 98.8 F (37.1 C)  SpO2: 100%   Filed Weights   07/24/17 1308  Weight: 172 lb 11.2 oz (78.3 kg)    GENERAL:alert, no distress and comfortable SKIN: skin color, texture, turgor are normal, no rashes or significant lesions EYES: normal, Conjunctiva are pink and non-injected, sclera clear OROPHARYNX:no exudate, no erythema and lips, buccal mucosa, and tongue normal  NECK: Tracheostomy site looks okay  LYMPH:  no palpable lymphadenopathy in the cervical, axillary or inguinal LUNGS: clear to auscultation and percussion with normal breathing effort HEART: regular rate & rhythm and no murmurs and no lower extremity edema ABDOMEN:abdomen soft,  non-tender and normal bowel sounds.  Feeding tube site looks okay Musculoskeletal:no cyanosis of digits and no clubbing  NEURO: alert & oriented x 3 with fluent speech, no focal motor/sensory deficits  LABORATORY DATA:  I have reviewed the data as listed    Component Value Date/Time   NA 142 07/24/2017 1230   K 4.0 07/24/2017 1230   CL 100 06/28/2017 1642   CO2  28 07/24/2017 1230   GLUCOSE 97 07/24/2017 1230   BUN 12.8 07/24/2017 1230   CREATININE 0.8 07/24/2017 1230   CALCIUM 10.1 07/24/2017 1230   PROT 7.0 07/24/2017 1230   ALBUMIN 3.8 07/24/2017 1230   AST 14 07/24/2017 1230   ALT 14 07/24/2017 1230   ALKPHOS 65 07/24/2017 1230   BILITOT 0.50 07/24/2017 1230   GFRNONAA 98 06/28/2017 1642   GFRAA 114 06/28/2017 1642    No results found for: SPEP, UPEP  Lab Results  Component Value Date   WBC 9.9 07/24/2017   NEUTROABS 8.6 (H) 07/24/2017   HGB 13.4 07/24/2017   HCT 39.4 07/24/2017   MCV 97.2 07/24/2017   PLT 200 07/24/2017      Chemistry      Component Value Date/Time   NA 142 07/24/2017 1230   K 4.0 07/24/2017 1230   CL 100 06/28/2017 1642   CO2 28 07/24/2017 1230   BUN 12.8 07/24/2017 1230   CREATININE 0.8 07/24/2017 1230      Component Value Date/Time   CALCIUM 10.1 07/24/2017 1230   ALKPHOS 65 07/24/2017 1230   AST 14 07/24/2017 1230   ALT 14 07/24/2017 1230   BILITOT 0.50 07/24/2017 1230      ASSESSMENT & PLAN:  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HCC) His recent CT scan show stable disease He is experiencing a lot of pain and tightness around his neck The patient wants to proceed with palliative chemotherapy today I will get assistant from my navigator team to help get an appointment sooner with ENT for evaluation around his tracheostomy site   Tracheostomy care [facility]) He has significant complain about tightness and discomfort Clinically, there is no signs of infection of bleeding to tracheostomy.  I recommend ENT consult for  evaluation  Cancer associated pain This is overall stable He will continue taking MS Contin and breakthrough pain medicine as needed He denies excessive constipation  Bilateral leg edema He had recent leg edema, resolved I recommend discontinuation of IV fluids if he felt that his nausea is not significant after chemotherapy His kidney function tests and serum albumin are within normal limits   No orders of the defined types were placed in this encounter.  All questions were answered. The patient knows to call the clinic with any problems, questions or concerns. No barriers to learning was detected. I spent 15 minutes counseling the patient face to face. The total time spent in the appointment was 20 minutes and more than 50% was on counseling and review of test results     [name], MD 07/25/2017 5:55 AM

## 2017-07-25 NOTE — Assessment & Plan Note (Signed)
His recent CT scan show stable disease He is experiencing a lot of pain and tightness around his neck The patient wants to proceed with palliative chemotherapy today I will get assistant from my navigator team to help get an appointment sooner with ENT for evaluation around his tracheostomy site

## 2017-07-25 NOTE — Telephone Encounter (Signed)
Pt cancelled today's fluid appt. He feels OK. He plans on keeping tomorrow's appt.

## 2017-07-25 NOTE — Assessment & Plan Note (Signed)
He had recent leg edema, resolved I recommend discontinuation of IV fluids if he felt that his nausea is not significant after chemotherapy His kidney function tests and serum albumin are within normal limits

## 2017-07-25 NOTE — Assessment & Plan Note (Signed)
This is overall stable He will continue taking MS Contin and breakthrough pain medicine as needed He denies excessive constipation 

## 2017-07-25 NOTE — Assessment & Plan Note (Signed)
He has significant complain about tightness and discomfort Clinically, there is no signs of infection of bleeding to tracheostomy.  I recommend ENT consult for evaluation 

## 2017-07-26 ENCOUNTER — Ambulatory Visit: Payer: Self-pay

## 2017-07-26 ENCOUNTER — Telehealth: Payer: Self-pay

## 2017-07-26 NOTE — Telephone Encounter (Signed)
Pt called he feels well today and does not need fluids. He is cancelling today's appt. Done.

## 2017-08-08 ENCOUNTER — Ambulatory Visit: Payer: Medicaid Other

## 2017-08-08 ENCOUNTER — Ambulatory Visit (HOSPITAL_BASED_OUTPATIENT_CLINIC_OR_DEPARTMENT_OTHER): Payer: Medicaid Other

## 2017-08-08 ENCOUNTER — Other Ambulatory Visit (HOSPITAL_BASED_OUTPATIENT_CLINIC_OR_DEPARTMENT_OTHER): Payer: Medicaid Other

## 2017-08-08 VITALS — BP 130/79 | HR 63 | Temp 98.9°F | Resp 16

## 2017-08-08 DIAGNOSIS — C321 Malignant neoplasm of supraglottis: Secondary | ICD-10-CM | POA: Diagnosis not present

## 2017-08-08 DIAGNOSIS — Z5111 Encounter for antineoplastic chemotherapy: Secondary | ICD-10-CM

## 2017-08-08 LAB — COMPREHENSIVE METABOLIC PANEL
ALT: 12 U/L (ref 0–55)
ANION GAP: 8 meq/L (ref 3–11)
AST: 11 U/L (ref 5–34)
Albumin: 3.6 g/dL (ref 3.5–5.0)
Alkaline Phosphatase: 59 U/L (ref 40–150)
BUN: 11.2 mg/dL (ref 7.0–26.0)
CALCIUM: 9.8 mg/dL (ref 8.4–10.4)
CHLORIDE: 105 meq/L (ref 98–109)
CO2: 28 meq/L (ref 22–29)
CREATININE: 0.8 mg/dL (ref 0.7–1.3)
EGFR: >90 mL/min/{1.73_m2} (ref >90)
GLUCOSE: 100 mg/dL (ref 70–140)
Potassium: 3.8 mEq/L (ref 3.5–5.1)
Sodium: 141 mEq/L (ref 136–145)
TOTAL PROTEIN: 6.8 g/dL (ref 6.4–8.3)
Total Bilirubin: 0.4 mg/dL (ref 0.20–1.20)

## 2017-08-08 LAB — CBC WITH DIFFERENTIAL/PLATELET
BASO%: 0.2 % (ref 0.0–2.0)
BASOS ABS: 0 10*3/uL (ref 0.0–0.1)
EOS ABS: 0.1 10*3/uL (ref 0.0–0.5)
EOS%: 1 % (ref 0.0–7.0)
HCT: 38.9 % (ref 38.4–49.9)
HEMOGLOBIN: 13.1 g/dL (ref 13.0–17.1)
LYMPH%: 8.2 % — AB (ref 14.0–49.0)
MCH: 32.8 pg (ref 27.2–33.4)
MCHC: 33.7 g/dL (ref 32.0–36.0)
MCV: 97.4 fL (ref 79.3–98.0)
MONO#: 0.8 10*3/uL (ref 0.1–0.9)
MONO%: 9.6 % (ref 0.0–14.0)
NEUT#: 6.4 10*3/uL (ref 1.5–6.5)
NEUT%: 81 % — AB (ref 39.0–75.0)
Platelets: 199 10*3/uL (ref 140–400)
RBC: 3.99 10*6/uL — AB (ref 4.20–5.82)
RDW: 15.9 % — ABNORMAL HIGH (ref 11.0–14.6)
WBC: 7.9 10*3/uL (ref 4.0–10.3)
lymph#: 0.7 10*3/uL — ABNORMAL LOW (ref 0.9–3.3)

## 2017-08-08 MED ORDER — FAMOTIDINE IN NACL 20-0.9 MG/50ML-% IV SOLN
20.0000 mg | Freq: Once | INTRAVENOUS | Status: AC
  Administered 2017-08-08: 20 mg via INTRAVENOUS

## 2017-08-08 MED ORDER — HYDROMORPHONE HCL 4 MG/ML IJ SOLN
2.0000 mg | Freq: Once | INTRAMUSCULAR | Status: AC
  Administered 2017-08-08: 2 mg via INTRAVENOUS
  Filled 2017-08-08: qty 1

## 2017-08-08 MED ORDER — PACLITAXEL CHEMO INJECTION 300 MG/50ML
80.0000 mg/m2 | Freq: Once | INTRAVENOUS | Status: AC
  Administered 2017-08-08: 156 mg via INTRAVENOUS
  Filled 2017-08-08: qty 26

## 2017-08-08 MED ORDER — SODIUM CHLORIDE 0.9 % IV SOLN
Freq: Once | INTRAVENOUS | Status: DC
Start: 2017-08-08 — End: 2018-03-27

## 2017-08-08 MED ORDER — HEPARIN SOD (PORK) LOCK FLUSH 100 UNIT/ML IV SOLN
250.0000 [IU] | Freq: Once | INTRAVENOUS | Status: DC | PRN
  Filled 2017-08-08: qty 5

## 2017-08-08 MED ORDER — DIPHENHYDRAMINE HCL 50 MG/ML IJ SOLN
50.0000 mg | Freq: Once | INTRAMUSCULAR | Status: AC
  Administered 2017-08-08: 50 mg via INTRAVENOUS

## 2017-08-08 MED ORDER — ALTEPLASE 2 MG IJ SOLR
2.0000 mg | Freq: Once | INTRAMUSCULAR | Status: DC | PRN
  Filled 2017-08-08: qty 2

## 2017-08-08 MED ORDER — SODIUM CHLORIDE 0.9% FLUSH
10.0000 mL | INTRAVENOUS | Status: DC | PRN
  Administered 2017-08-08: 10 mL
  Filled 2017-08-08: qty 10

## 2017-08-08 MED ORDER — PALONOSETRON HCL INJECTION 0.25 MG/5ML
0.2500 mg | Freq: Once | INTRAVENOUS | Status: AC
  Administered 2017-08-08: 0.25 mg via INTRAVENOUS

## 2017-08-08 MED ORDER — ONDANSETRON HCL 4 MG/2ML IJ SOLN: 8.0000 mg | Freq: Once | INTRAMUSCULAR | Status: DC

## 2017-08-08 MED ORDER — HEPARIN SOD (PORK) LOCK FLUSH 100 UNIT/ML IV SOLN
500.0000 [IU] | Freq: Once | INTRAVENOUS | Status: DC | PRN
  Filled 2017-08-08: qty 5

## 2017-08-08 MED ORDER — SODIUM CHLORIDE 0.9 % IV SOLN
Freq: Once | INTRAVENOUS | Status: AC
  Administered 2017-08-08: 12:00:00 via INTRAVENOUS

## 2017-08-08 MED ORDER — FAMOTIDINE IN NACL 20-0.9 MG/50ML-% IV SOLN
INTRAVENOUS | Status: AC
  Filled 2017-08-08: qty 50

## 2017-08-08 MED ORDER — HEPARIN SOD (PORK) LOCK FLUSH 100 UNIT/ML IV SOLN
500.0000 [IU] | Freq: Once | INTRAVENOUS | Status: AC | PRN
  Administered 2017-08-08: 500 [IU]
  Filled 2017-08-08: qty 5

## 2017-08-08 MED ORDER — HYDROMORPHONE HCL 2 MG/ML IJ SOLN
INTRAMUSCULAR | Status: AC
  Filled 2017-08-08: qty 1

## 2017-08-08 MED ORDER — SODIUM CHLORIDE 0.9% FLUSH
10.0000 mL | Freq: Once | INTRAVENOUS | Status: AC
  Administered 2017-08-08: 10 mL
  Filled 2017-08-08: qty 10

## 2017-08-08 MED ORDER — SODIUM CHLORIDE 0.9 % IV SOLN
Freq: Once | INTRAVENOUS | Status: AC
  Administered 2017-08-08: 13:00:00 via INTRAVENOUS
  Filled 2017-08-08: qty 5

## 2017-08-08 MED ORDER — DIPHENHYDRAMINE HCL 50 MG/ML IJ SOLN
INTRAMUSCULAR | Status: AC
  Filled 2017-08-08: qty 1

## 2017-08-08 MED ORDER — SODIUM CHLORIDE 0.9 % IJ SOLN
10.0000 mL | INTRAMUSCULAR | Status: DC | PRN
  Filled 2017-08-08: qty 10

## 2017-08-08 MED ORDER — PALONOSETRON HCL INJECTION 0.25 MG/5ML
INTRAVENOUS | Status: AC
  Filled 2017-08-08: qty 5

## 2017-08-08 MED FILL — LEVOTHYROXINE 100 MCG TAB: 100 | 30 days supply | Qty: 30 | Fill #5

## 2017-08-08 NOTE — Patient Instructions (Signed)
[facility] Discharge Instructions for Patients Receiving Chemotherapy  Today you received the following chemotherapy agents taxol  To help prevent nausea and vomiting after your treatment, we encourage you to take your nausea medication as directed   If you develop nausea and vomiting that is not controlled by your nausea medication, call the clinic.   BELOW ARE SYMPTOMS THAT SHOULD BE REPORTED IMMEDIATELY:  *FEVER GREATER THAN 100.5 F  *CHILLS WITH OR WITHOUT FEVER  NAUSEA AND VOMITING THAT IS NOT CONTROLLED WITH YOUR NAUSEA MEDICATION  *UNUSUAL SHORTNESS OF BREATH  *UNUSUAL BRUISING OR BLEEDING  TENDERNESS IN MOUTH AND THROAT WITH OR WITHOUT PRESENCE OF ULCERS  *URINARY PROBLEMS  *BOWEL PROBLEMS  UNUSUAL RASH Items with * indicate a potential emergency and should be followed up as soon as possible.  Feel free to call the clinic you have any questions or concerns. The clinic phone number is [phone].  

## 2017-08-09 ENCOUNTER — Telehealth: Payer: Self-pay

## 2017-08-09 ENCOUNTER — Ambulatory Visit: Payer: Self-pay

## 2017-08-09 NOTE — Telephone Encounter (Signed)
Pt called to cancel his fluids today. There was no appt on books. He will call tomorrow if he wants to cancel tomorrow's fluids. Last 3 days felt nausea but no vomiting, after chemo yesterday he feels good. He was using zofran only daily, instructed him to try using it up to every 8 hours to see if it helps. After the 3 day period post aloxi premed.

## 2017-08-10 ENCOUNTER — Telehealth: Payer: Self-pay

## 2017-08-10 ENCOUNTER — Ambulatory Visit: Payer: Self-pay

## 2017-08-10 NOTE — Telephone Encounter (Signed)
pls cancel and inform infusion room

## 2017-08-10 NOTE — Telephone Encounter (Signed)
Pt called to cancel fluids today.  Yesterday at 6 pm he had vomiting when he put water in his feeding tube he vomited for about 10 minutes. He had no problems in his 1200, 0300 and 0600 or 1030  feedings. He feels OK this morning.  Instructed him to use a watchful approach and will let Dr Alvy Bimler be aware.

## 2017-08-10 NOTE — Telephone Encounter (Signed)
done

## 2017-08-16 ENCOUNTER — Other Ambulatory Visit: Payer: Self-pay | Admitting: *Deleted

## 2017-08-16 ENCOUNTER — Telehealth: Payer: Self-pay | Admitting: *Deleted

## 2017-08-16 DIAGNOSIS — G893 Neoplasm related pain (acute) (chronic): Secondary | ICD-10-CM

## 2017-08-16 MED ORDER — MORPHINE SULFATE 30 MG PO TABS: 60.0000 mg | ORAL_TABLET | ORAL | 0 refills | Status: DC | PRN

## 2017-08-16 MED ORDER — AMOXICILLIN 250 MG/5ML PO SUSR: 500.0000 mg | Freq: Two times a day (BID) | ORAL | 0 refills | Status: DC

## 2017-08-16 MED FILL — AMOXICILLIN 250 MG/5 ML SUS: 250 | 7 days supply | Qty: 200 | Fill #0

## 2017-08-16 NOTE — Telephone Encounter (Signed)
Notified of message below. Will pick up prescriptions tomorrow am

## 2017-08-16 NOTE — Telephone Encounter (Signed)
Pt states he thinks he is getting a cold, has been sneezing for a couple of days and throat is very sore. Temperature at 1:00 pm today was 99.8  Is also requesting a refill of MSIR.

## 2017-08-16 NOTE — Telephone Encounter (Signed)
He is immunocompromised, at risk of infection I would recommend we call in liquid form amoxicillin 500 mg BID PO/PEG x 7 days no refill Do you want to print along with MSIR?

## 2017-08-17 MED FILL — MORPHINE SULFATE IR 30 MG T: 30 | 8 days supply | Qty: 90 | Fill #0

## 2017-08-21 ENCOUNTER — Telehealth: Payer: Self-pay | Admitting: Hematology and Oncology

## 2017-08-21 ENCOUNTER — Other Ambulatory Visit (HOSPITAL_BASED_OUTPATIENT_CLINIC_OR_DEPARTMENT_OTHER): Payer: Medicaid Other

## 2017-08-21 ENCOUNTER — Encounter: Payer: Self-pay | Admitting: Hematology and Oncology

## 2017-08-21 ENCOUNTER — Ambulatory Visit (HOSPITAL_BASED_OUTPATIENT_CLINIC_OR_DEPARTMENT_OTHER): Payer: Medicaid Other

## 2017-08-21 ENCOUNTER — Ambulatory Visit (HOSPITAL_BASED_OUTPATIENT_CLINIC_OR_DEPARTMENT_OTHER): Payer: Medicaid Other | Admitting: Hematology and Oncology

## 2017-08-21 ENCOUNTER — Ambulatory Visit: Payer: Medicaid Other

## 2017-08-21 VITALS — BP 123/78 | HR 84 | Temp 98.7°F | Resp 18 | Ht 67.5 in | Wt 173.3 lb

## 2017-08-21 DIAGNOSIS — Z5111 Encounter for antineoplastic chemotherapy: Secondary | ICD-10-CM

## 2017-08-21 DIAGNOSIS — G893 Neoplasm related pain (acute) (chronic): Secondary | ICD-10-CM | POA: Diagnosis not present

## 2017-08-21 DIAGNOSIS — C321 Malignant neoplasm of supraglottis: Secondary | ICD-10-CM

## 2017-08-21 DIAGNOSIS — Z930 Tracheostomy status: Secondary | ICD-10-CM | POA: Diagnosis not present

## 2017-08-21 LAB — CBC WITH DIFFERENTIAL/PLATELET
BASO%: 0.5 % (ref 0.0–2.0)
Basophils Absolute: 0.1 10*3/uL (ref 0.0–0.1)
EOS%: 0.2 % (ref 0.0–7.0)
Eosinophils Absolute: 0 10*3/uL (ref 0.0–0.5)
HCT: 39.1 % (ref 38.4–49.9)
HGB: 13.2 g/dL (ref 13.0–17.1)
LYMPH%: 4.5 % — AB (ref 14.0–49.0)
MCH: 32.9 pg (ref 27.2–33.4)
MCHC: 33.8 g/dL (ref 32.0–36.0)
MCV: 97.5 fL (ref 79.3–98.0)
MONO#: 0.8 10*3/uL (ref 0.1–0.9)
MONO%: 7.4 % (ref 0.0–14.0)
NEUT%: 87.4 % — AB (ref 39.0–75.0)
NEUTROS ABS: 9.6 10*3/uL — AB (ref 1.5–6.5)
PLATELETS: 213 10*3/uL (ref 140–400)
RBC: 4.01 10*6/uL — AB (ref 4.20–5.82)
RDW: 16.2 % — ABNORMAL HIGH (ref 11.0–14.6)
WBC: 11 10*3/uL — AB (ref 4.0–10.3)
lymph#: 0.5 10*3/uL — ABNORMAL LOW (ref 0.9–3.3)

## 2017-08-21 LAB — COMPREHENSIVE METABOLIC PANEL
ALT: 10 U/L (ref 0–55)
ANION GAP: 9 meq/L (ref 3–11)
AST: 13 U/L (ref 5–34)
Albumin: 3.8 g/dL (ref 3.5–5.0)
Alkaline Phosphatase: 67 U/L (ref 40–150)
BUN: 14.4 mg/dL (ref 7.0–26.0)
CHLORIDE: 105 meq/L (ref 98–109)
CO2: 27 meq/L (ref 22–29)
Calcium: 9.9 mg/dL (ref 8.4–10.4)
Creatinine: 0.8 mg/dL (ref 0.7–1.3)
Glucose: 108 mg/dl (ref 70–140)
Potassium: 3.8 mEq/L (ref 3.5–5.1)
SODIUM: 140 meq/L (ref 136–145)
Total Bilirubin: 0.25 mg/dL (ref 0.20–1.20)
Total Protein: 7.2 g/dL (ref 6.4–8.3)

## 2017-08-21 MED ORDER — HEPARIN SOD (PORK) LOCK FLUSH 100 UNIT/ML IV SOLN
500.0000 [IU] | Freq: Once | INTRAVENOUS | Status: AC | PRN
  Administered 2017-08-21: 500 [IU]
  Filled 2017-08-21: qty 5

## 2017-08-21 MED ORDER — HYDROMORPHONE HCL 2 MG/ML IJ SOLN
INTRAMUSCULAR | Status: AC
  Filled 2017-08-21: qty 1

## 2017-08-21 MED ORDER — SODIUM CHLORIDE 0.9% FLUSH
10.0000 mL | INTRAVENOUS | Status: DC | PRN
  Administered 2017-08-21: 10 mL
  Filled 2017-08-21: qty 10

## 2017-08-21 MED ORDER — FAMOTIDINE IN NACL 20-0.9 MG/50ML-% IV SOLN
20.0000 mg | Freq: Once | INTRAVENOUS | Status: AC
  Administered 2017-08-21: 20 mg via INTRAVENOUS

## 2017-08-21 MED ORDER — PALONOSETRON HCL INJECTION 0.25 MG/5ML
0.2500 mg | Freq: Once | INTRAVENOUS | Status: AC
  Administered 2017-08-21: 0.25 mg via INTRAVENOUS

## 2017-08-21 MED ORDER — PACLITAXEL CHEMO INJECTION 300 MG/50ML
80.0000 mg/m2 | Freq: Once | INTRAVENOUS | Status: AC
  Administered 2017-08-21: 156 mg via INTRAVENOUS
  Filled 2017-08-21: qty 26

## 2017-08-21 MED ORDER — DIPHENHYDRAMINE HCL 50 MG/ML IJ SOLN
50.0000 mg | Freq: Once | INTRAMUSCULAR | Status: AC
  Administered 2017-08-21: 50 mg via INTRAVENOUS

## 2017-08-21 MED ORDER — DIPHENHYDRAMINE HCL 50 MG/ML IJ SOLN
INTRAMUSCULAR | Status: AC
  Filled 2017-08-21: qty 1

## 2017-08-21 MED ORDER — SODIUM CHLORIDE 0.9 % IJ SOLN
10.0000 mL | Freq: Once | INTRAMUSCULAR | Status: AC
  Administered 2017-08-21: 10 mL via INTRAVENOUS
  Filled 2017-08-21: qty 10

## 2017-08-21 MED ORDER — HYDROMORPHONE HCL 2 MG/ML IJ SOLN
2.0000 mg | Freq: Once | INTRAMUSCULAR | Status: AC
  Administered 2017-08-21: 2 mg via INTRAVENOUS
  Filled 2017-08-21: qty 1

## 2017-08-21 MED ORDER — PALONOSETRON HCL INJECTION 0.25 MG/5ML
INTRAVENOUS | Status: AC
  Filled 2017-08-21: qty 5

## 2017-08-21 MED ORDER — FAMOTIDINE IN NACL 20-0.9 MG/50ML-% IV SOLN
INTRAVENOUS | Status: AC
  Filled 2017-08-21: qty 50

## 2017-08-21 MED ORDER — SODIUM CHLORIDE 0.9 % IV SOLN
Freq: Once | INTRAVENOUS | Status: AC
  Administered 2017-08-21: 13:00:00 via INTRAVENOUS
  Filled 2017-08-21: qty 5

## 2017-08-21 MED ORDER — SODIUM CHLORIDE 0.9 % IV SOLN
Freq: Once | INTRAVENOUS | Status: AC
  Administered 2017-08-21: 13:00:00 via INTRAVENOUS

## 2017-08-21 NOTE — Progress Notes (Signed)
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[place])   03/15/2015 - 03/17/2015 Hospital Admission    He was admitted to the hospital for evaluation of dysphagia, SOB, hemoptosis, hoarseness, 30-40 pound weight loss and worsening bilateral neck masses for 5 months      03/15/2015 Imaging    Ct showed extensive circumferential malignancy in the hypopharyngeal/supraglottic region with regional LN metastases      03/16/2015 Procedure    He underwent ULTRASOUND-GUIDED BIOPSY OF LEFT CERVICAL LYMPH NODES      03/16/2015 Pathology Results    Accession: [id number] LN biopsy showed invasive squamous cell cancer      03/16/2015 Pathology Results    Accession: [id number] showed atypical squamous cells      03/25/2015 - 04/07/2015 Hospital Admission    He was admitted to the hospital and underwent tracheostomy placement, feeeding tube placement but subsequently left [facility]      03/26/2015 Surgery    He had multiple extraction of tooth numbers 1, 2, 5, 6, 7, 8, 9, 10, 11, 12, 13, 18, 19, 21, 22, 23, 24, 25, 26, 27, 28, and 29. and 4 Quadrants of alveoloplasty      03/26/2015 Surgery    He underwent tracheostomy      03/31/2015 Surgery    He had open gastrostomy tube placement by Dr. Donne Hazel      04/16/2015 - 05/18/2015 Radiation Therapy    Laryngopharynx and bilateral neck / 50 Gy in 20 fractions to gross disease, 45 Gy in 20 fractions to high risk nodal echelons  Beams/energy: Helical IMRT / 6 MV photons      04/16/2015 Procedure    Fluoroscopic reposition of the 18 French gastrostomy confirmed back in the stomach,       07/03/2015 Procedure    IR performed replacement of gastrostomy tube with a new 65 French balloon retention tube      10/01/2015 Imaging    PEt scan showed persistent hypermetabolism within the primary supraglottic laryngeal tumor and within right retropharyngeal, bilateral level II and left level IV cervical nodal metastases      10/28/2015 Procedure    He had placement of PICC line. The IR was not able to place PORT due to suspected upper respiratory infection      11/13/2015 - 03/21/2016 Chemotherapy    He received 5FU, carboplatin chemo with weekly Erbitux      11/19/2015 Surgery    Gastrostomy tube replaced.      11/30/2015 Procedure    Placement of right jugular port-a-cath.      11/30/2015 Procedure    PICC removed.      12/30/2015 Imaging    PET CT showed positive response to Rx      02/26/2016 Procedure    He underwent direct laryngoscopy with biopsy. Esophageal dilatation.       02/26/2016 Pathology Results    Repeat biopsy of supraglottis showed persistent disease      02/29/2016 Procedure    Gastrostomy tube exchanged.      03/28/2016 PET scan    Hypermetabolic tissue in the posterior RIGHT hypopharynx is similar in pattern to PET-CT of 12/30/2015 but increased in metabolic activity.2. Hypermetabolic  tissue / lymph nodes in the LEFT supraclavicular nodal station are in a similar pattern       04/04/2016 - 11/29/2016 Chemotherapy    He started on palliative chemotherapy with pembrolizumab      05/03/2016 Imaging    MRI head is negative      06/02/2016 Imaging    Mild improvement of diffuse pharyngeal and supraglottic edema.Increased asymmetry of right-sided hypopharyngeal/supraglottic softtissue compared to the prior neck CT with FDG uptake in this region on prior PET-CT. Residual/recurrent tumor is possible      08/09/2016 Procedure    IR placed new 20 French percutaneous gastrostomy tube.      08/26/2016 Imaging    Ct neck showed unchanged diffuse  pharyngeal and supraglottic edema as well as asymmetric soft tissue thickening on the right. Slightly decreased size of some left level II lymph nodes. Unchanged lymphadenopathy elsewhere in the neck. Decreased size of thyroid mass. No evidence of metastatic cancer to the chest      12/13/2016 Imaging    Ct chest showed no evidence of thoracic metastatic disease or primary thoracic malignancy.      12/13/2016 Imaging    Ct neck showed progression of of RIGHT supraglottic mass compared with priors, approximate size 24 x 27 x 27 mm. Extension caudally along the RIGHT area epiglottic fold with increasing mass effect on the airway. Tracheostomy satisfactory position. Stable to slightly improved malignant adenopathy.      12/26/2016 -  Chemotherapy    He received chemotherapy with weekly Taxol       02/10/2017 Imaging    Very similar appearance to the study of January. Right sided supraglottic to glottic mass is similar, perhaps a few mm smaller. Bilateral enlarged nodes appear the same. No progressive finding      02/10/2017 Imaging    No evidence of metastatic disease in the abdomen/pelvis. Percutaneous gastrostomy in satisfactory position. Cholelithiasis, without associated inflammatory changes.      06/06/2017 Imaging    CT neck: Supraglottic tumor on the right is mildly smaller now measuring 23 x 16 mm. Improvement in bilateral cervical adenopathy. No new adenopathy identified       INTERVAL HISTORY: Please see below for problem oriented charting. He returns for further chemotherapy He continues to complain of tightness and discomfort around the tracheostomy site He has occasional bleeding around the No recent nausea or vomiting Denies peripheral neuropathy He was prescribed pain medicine and his pain is stable  REVIEW OF SYSTEMS:   Constitutional: Denies fevers, chills or abnormal weight loss Eyes: Denies blurriness of vision Ears, nose, mouth, throat, and face: Denies mucositis or  sore throat Respiratory: Denies cough, dyspnea or wheezes Cardiovascular: Denies palpitation, chest discomfort or lower extremity swelling Gastrointestinal:  Denies nausea, heartburn or change in bowel habits Skin: Denies abnormal skin rashes Lymphatics: Denies new lymphadenopathy or easy bruising Neurological:Denies numbness, tingling or new weaknesses Behavioral/Psych: Mood is stable, no new changes  All other systems were reviewed with the patient and are negative.  I have reviewed the past medical history, past surgical history, social history and family history with the patient and they are unchanged from previous note.  ALLERGIES:  is allergic to aspirin.  MEDICATIONS:  Current Outpatient Prescriptions  Medication Sig Dispense Refill  . amoxicillin (AMOXIL) 250 MG/5ML suspension Place 10 mLs (500 mg total) into feeding tube 2 (two) times daily. For 7 days 140 mL 0  . fluticasone (FLONASE) 50 MCG/ACT nasal spray Place 2 sprays into both nostrils  daily. 16 g 2  . levothyroxine (SYNTHROID) 100 MCG tablet Take 1 tablet (100 mcg total) by mouth daily before breakfast. 30 tablet 9  . morphine (MS CONTIN) 60 MG 12 hr tablet Take 1 tablet (60 mg total) by mouth every 12 (twelve) hours. 60 tablet 0  . morphine (MSIR) 30 MG tablet Take 2 tablets (60 mg total) by mouth every 4 (four) hours as needed for severe pain. 90 tablet 0  . Nutritional Supplements (FEEDING SUPPLEMENT, JEVITY 1.5 CAL/FIBER,) LIQD Give 1 can Osmolite 1.2 + 1 can of Jevity 1.5 QID via PEG with 60 cc free water before and after bolus. Flush with 240 cc free water BID between feedings. 948 mL   . omeprazole (PRILOSEC) 40 MG capsule Take 1 capsule (40 mg total) by mouth daily. 90 capsule 3  . ondansetron (ZOFRAN) 8 MG tablet Take 1 tablet (8 mg total) by mouth every 8 (eight) hours as needed for nausea. 60 tablet 3  . scopolamine (TRANSDERM-SCOP, 1.5 MG,) 1 MG/3DAYS Place 1 patch (1.5 mg total) onto the skin every 3 (three)  days. 10 patch 12  . sodium chloride irrigation 0.9 % irrigation USE TO CLEAN AROUND TRACH AND PERFORM TRACH CARE ONCE DAILY AND AS NEEDED 1000 mL 11  . triamcinolone (NASACORT AQ) 55 MCG/ACT AERO nasal inhaler Place 2 sprays into the nose daily. 1 Inhaler 12   Current Facility-Administered Medications  Medication Dose Route Frequency Provider Last Rate Last Dose  . guaiFENesin-dextromethorphan (ROBITUSSIN DM) 100-10 MG/5ML syrup 10 mL  10 mL Oral Q4H PRN Alvy Bimler, Krystopher Kuenzel, MD   10 mL at 01/09/17 1427   Facility-Administered Medications Ordered in Other Visits  Medication Dose Route Frequency Provider Last Rate Last Dose  . 0.9 %  sodium chloride infusion   Intravenous Once Laurance Heide, MD      . 0.9 %  sodium chloride infusion   Intravenous Once Alvy Bimler, Allura Doepke, MD      . alteplase (CATHFLO ACTIVASE) injection 2 mg  2 mg Intracatheter Once PRN Alvy Bimler, Janyiah Silveri, MD      . diphenhydrAMINE (BENADRYL) injection 50 mg  50 mg Intravenous Once Alvy Bimler, Tyyne Cliett, MD      . famotidine (PEPCID) IVPB 20 mg premix  20 mg Intravenous Once Alvy Bimler, Regina Ganci, MD      . fosaprepitant (EMEND) 150 mg, dexamethasone (DECADRON) 12 mg in sodium chloride 0.9 % 145 mL IVPB   Intravenous Once [name], MD 454 mL/hr at 08/21/17 1322    . heparin lock flush 100 unit/mL  500 Units Intracatheter Once PRN Alvy Bimler, Elmar Antigua, MD      . heparin lock flush 100 unit/mL  250 Units Intracatheter Once PRN Alvy Bimler, Cyndel Griffey, MD      . heparin lock flush 100 unit/mL  500 Units Intracatheter Once PRN Alvy Bimler, Tiffanie Blassingame, MD      . HYDROmorphone (DILAUDID) injection 2 mg  2 mg Intravenous Q2H PRN Alvy Bimler, Demesha Boorman, MD   2 mg at 12/26/16 1440  . ondansetron (ZOFRAN) injection 8 mg  8 mg Intravenous Once Susanne Borders, NP      . PACLitaxel (TAXOL) 156 mg in dextrose 5 % 250 mL chemo infusion (> 80mg /m2)  80 mg/m2 (Treatment Plan Recorded) Intravenous Once Loula Marcella, MD      . sodium chloride 0.9 % injection 10 mL  10 mL Intracatheter PRN Alvy Bimler, Ahlaya Ende, MD   10 mL at 01/02/17 1107   . sodium chloride 0.9 % injection 10 mL  10 mL Intracatheter PRN Alvy Bimler,  Foye Damron, MD      . sodium chloride flush (NS) 0.9 % injection 10 mL  10 mL Intracatheter PRN Alvy Bimler, Sahil Milner, MD   10 mL at 12/26/16 1708  . sodium chloride flush (NS) 0.9 % injection 10 mL  10 mL Intracatheter PRN Alvy Bimler, Jazara Swiney, MD   10 mL at 07/24/17 1704  . sodium chloride flush (NS) 0.9 % injection 10 mL  10 mL Intracatheter PRN Alvy Bimler, Daveyon Kitchings, MD        PHYSICAL EXAMINATION: ECOG PERFORMANCE STATUS: 1 - Symptomatic but completely ambulatory  Vitals:   08/21/17 1151  BP: 123/78  Pulse: 84  Resp: 18  Temp: 98.7 F (37.1 C)  SpO2: 100%   Filed Weights   08/21/17 1151  Weight: 173 lb 4.8 oz (78.6 kg)    GENERAL:alert, no distress and comfortable SKIN: skin color, texture, turgor are normal, no rashes or significant lesions EYES: normal, Conjunctiva are pink and non-injected, sclera clear OROPHARYNX:no exudate, no erythema and lips, buccal mucosa, and tongue normal  NECK: Tracheostomy in situ.  No signs of bleeding LYMPH:  no palpable lymphadenopathy in the cervical, axillary or inguinal LUNGS: clear to auscultation and percussion with normal breathing effort HEART: regular rate & rhythm and no murmurs and no lower extremity edema ABDOMEN:abdomen soft, non-tender and normal bowel sounds Musculoskeletal:no cyanosis of digits and no clubbing  NEURO: alert & oriented x 3 with fluent speech, no focal motor/sensory deficits  LABORATORY DATA:  I have reviewed the data as listed    Component Value Date/Time   NA 140 08/21/2017 1101   K 3.8 08/21/2017 1101   CL 100 06/28/2017 1642   CO2 27 08/21/2017 1101   GLUCOSE 108 08/21/2017 1101   BUN 14.4 08/21/2017 1101   CREATININE 0.8 08/21/2017 1101   CALCIUM 9.9 08/21/2017 1101   PROT 7.2 08/21/2017 1101   ALBUMIN 3.8 08/21/2017 1101   AST 13 08/21/2017 1101   ALT 10 08/21/2017 1101   ALKPHOS 67 08/21/2017 1101   BILITOT 0.25 08/21/2017 1101   GFRNONAA 98 06/28/2017  1642   GFRAA 114 06/28/2017 1642    No results found for: SPEP, UPEP  Lab Results  Component Value Date   WBC 11.0 (H) 08/21/2017   NEUTROABS 9.6 (H) 08/21/2017   HGB 13.2 08/21/2017   HCT 39.1 08/21/2017   MCV 97.5 08/21/2017   PLT 213 08/21/2017      Chemistry      Component Value Date/Time   NA 140 08/21/2017 1101   K 3.8 08/21/2017 1101   CL 100 06/28/2017 1642   CO2 27 08/21/2017 1101   BUN 14.4 08/21/2017 1101   CREATININE 0.8 08/21/2017 1101      Component Value Date/Time   CALCIUM 9.9 08/21/2017 1101   ALKPHOS 67 08/21/2017 1101   AST 13 08/21/2017 1101   ALT 10 08/21/2017 1101   BILITOT 0.25 08/21/2017 1101      ASSESSMENT & PLAN:  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  I recommend ENT consult for evaluation  Cancer associated pain This is overall  stable He will continue taking MS Contin and breakthrough pain medicine as needed He denies excessive constipation   Orders Placed This Encounter  Procedures  . CT Soft Tissue Neck W Contrast    Standing Status:   Future    Standing Expiration Date:   08/21/2018    Order Specific Question:   If indicated for the ordered procedure, I authorize the administration of contrast media per Radiology protocol    Answer:   Yes    Order Specific Question:   Preferred imaging location?    Answer:   [facility]    Order Specific Question:   Radiology Contrast Protocol - do NOT remove file path    Answer:   [email or web address]   All  questions were answered. The patient knows to call the clinic with any problems, questions or concerns. No barriers to learning was detected. I spent 15 minutes counseling the patient face to face. The total time spent in the appointment was 20 minutes and more than 50% was on counseling and review of test results     [name], MD 08/21/2017 1:32 PM

## 2017-08-21 NOTE — Assessment & Plan Note (Signed)
His recent CT in June scan showed stable disease He is experiencing a lot of pain and tightness around his neck The patient wants to proceed with palliative chemotherapy today I plan to restage before I see him back in October with another CT scan of the neck for further evaluation

## 2017-08-21 NOTE — Assessment & Plan Note (Signed)
He has significant complain about tightness and discomfort Clinically, there is no signs of infection of bleeding to tracheostomy.  I recommend ENT consult for evaluation

## 2017-08-21 NOTE — Telephone Encounter (Signed)
Gave patient AVS and calendar of upcoming September appointments.  °

## 2017-08-21 NOTE — Assessment & Plan Note (Signed)
This is overall stable He will continue taking MS Contin and breakthrough pain medicine as needed He denies excessive constipation

## 2017-08-21 NOTE — Patient Instructions (Signed)
[facility] Discharge Instructions for Patients Receiving Chemotherapy  Today you received the following chemotherapy agents taxol  To help prevent nausea and vomiting after your treatment, we encourage you to take your nausea medication as directed   If you develop nausea and vomiting that is not controlled by your nausea medication, call the clinic.   BELOW ARE SYMPTOMS THAT SHOULD BE REPORTED IMMEDIATELY:  *FEVER GREATER THAN 100.5 F  *CHILLS WITH OR WITHOUT FEVER  NAUSEA AND VOMITING THAT IS NOT CONTROLLED WITH YOUR NAUSEA MEDICATION  *UNUSUAL SHORTNESS OF BREATH  *UNUSUAL BRUISING OR BLEEDING  TENDERNESS IN MOUTH AND THROAT WITH OR WITHOUT PRESENCE OF ULCERS  *URINARY PROBLEMS  *BOWEL PROBLEMS  UNUSUAL RASH Items with * indicate a potential emergency and should be followed up as soon as possible.  Feel free to call the clinic you have any questions or concerns. The clinic phone number is [phone].  Please show the [place] at check-in to the Emergency Department and triage nurse.

## 2017-08-21 NOTE — Patient Instructions (Signed)

## 2017-08-22 ENCOUNTER — Ambulatory Visit: Payer: Self-pay

## 2017-08-22 ENCOUNTER — Telehealth: Payer: Self-pay | Admitting: *Deleted

## 2017-08-22 NOTE — Telephone Encounter (Signed)
Pt states he does not need IVF

## 2017-08-23 ENCOUNTER — Telehealth: Payer: Self-pay | Admitting: *Deleted

## 2017-08-23 ENCOUNTER — Ambulatory Visit: Payer: Self-pay

## 2017-08-23 ENCOUNTER — Other Ambulatory Visit: Payer: Self-pay | Admitting: Hematology and Oncology

## 2017-08-23 MED FILL — TRANSDERM-SCOP 1.5 MG/3 DAY: 1 | 30 days supply | Qty: 10 | Fill #1

## 2017-08-23 NOTE — Telephone Encounter (Signed)
FYI Voicemail: "This is Ethanjames Fontenot calling to notify Dr. Alvy Bimler nurse I am cancelling for today."  Called Infusion area to notify of call to cancel today's 12:00 IVF appointment.

## 2017-08-24 ENCOUNTER — Other Ambulatory Visit: Payer: Self-pay | Admitting: Hematology and Oncology

## 2017-08-24 ENCOUNTER — Telehealth: Payer: Self-pay

## 2017-08-24 MED ORDER — MORPHINE SULFATE ER 60 MG PO TBCR: 60.0000 mg | EXTENDED_RELEASE_TABLET | Freq: Two times a day (BID) | ORAL | 0 refills | Status: DC

## 2017-08-24 MED FILL — ONDANSETRON HCL 8 MG TABLET: 8 | 20 days supply | Qty: 60 | Fill #0

## 2017-08-24 MED FILL — MORPHINE SULF 60 MG TAB SA: 60 | 30 days supply | Qty: 60 | Fill #0

## 2017-08-24 NOTE — Telephone Encounter (Signed)
err

## 2017-08-24 NOTE — Telephone Encounter (Signed)
Pt asked if there was a particular eye doctor Dr Alvy Bimler would recommend or refer to. His eye sight "is getting worse all the time". He has no eye doctor at present.

## 2017-08-24 NOTE — Telephone Encounter (Signed)
Called patient and told Rx ready for pick up.

## 2017-08-24 NOTE — Telephone Encounter (Signed)
ready

## 2017-08-24 NOTE — Telephone Encounter (Signed)
Pt called for MS contin refill. He wants to pick up this afternoon please.

## 2017-08-25 NOTE — Telephone Encounter (Signed)
I am not aware if any particular "eye doctor" will accept his insurance He might just need to call around and ask

## 2017-08-25 NOTE — Telephone Encounter (Signed)
S/w pt per Dr Gorsuch attached message. 

## 2017-09-04 ENCOUNTER — Ambulatory Visit: Payer: Medicaid Other

## 2017-09-04 ENCOUNTER — Other Ambulatory Visit (HOSPITAL_BASED_OUTPATIENT_CLINIC_OR_DEPARTMENT_OTHER): Payer: Medicaid Other

## 2017-09-04 ENCOUNTER — Ambulatory Visit (HOSPITAL_BASED_OUTPATIENT_CLINIC_OR_DEPARTMENT_OTHER): Payer: Medicaid Other

## 2017-09-04 VITALS — BP 124/88 | HR 81 | Temp 98.7°F | Resp 18

## 2017-09-04 DIAGNOSIS — Z5111 Encounter for antineoplastic chemotherapy: Secondary | ICD-10-CM

## 2017-09-04 DIAGNOSIS — C321 Malignant neoplasm of supraglottis: Secondary | ICD-10-CM

## 2017-09-04 LAB — COMPREHENSIVE METABOLIC PANEL
ALK PHOS: 57 U/L (ref 40–150)
ALT: 12 U/L (ref 0–55)
AST: 13 U/L (ref 5–34)
Albumin: 3.6 g/dL (ref 3.5–5.0)
Anion Gap: 9 mEq/L (ref 3–11)
BUN: 13.2 mg/dL (ref 7.0–26.0)
CO2: 27 mEq/L (ref 22–29)
Calcium: 9.7 mg/dL (ref 8.4–10.4)
Chloride: 105 mEq/L (ref 98–109)
Creatinine: 0.8 mg/dL (ref 0.7–1.3)
EGFR: >90 mL/min/{1.73_m2} (ref >90)
GLUCOSE: 121 mg/dL (ref 70–140)
Potassium: 4.1 mEq/L (ref 3.5–5.1)
SODIUM: 140 meq/L (ref 136–145)
TOTAL PROTEIN: 6.9 g/dL (ref 6.4–8.3)
Total Bilirubin: 0.26 mg/dL (ref 0.20–1.20)

## 2017-09-04 LAB — CBC WITH DIFFERENTIAL/PLATELET
BASO%: 0.2 % (ref 0.0–2.0)
Basophils Absolute: 0 10*3/uL (ref 0.0–0.1)
EOS%: 1.2 % (ref 0.0–7.0)
Eosinophils Absolute: 0.1 10*3/uL (ref 0.0–0.5)
HCT: 37.7 % — ABNORMAL LOW (ref 38.4–49.9)
HGB: 12.4 g/dL — ABNORMAL LOW (ref 13.0–17.1)
LYMPH%: 9.4 % — AB (ref 14.0–49.0)
MCH: 32.3 pg (ref 27.2–33.4)
MCHC: 32.9 g/dL (ref 32.0–36.0)
MCV: 98.2 fL — ABNORMAL HIGH (ref 79.3–98.0)
MONO#: 0.3 10*3/uL (ref 0.1–0.9)
MONO%: 3 % (ref 0.0–14.0)
NEUT#: 9.6 10*3/uL — ABNORMAL HIGH (ref 1.5–6.5)
NEUT%: 86.2 % — ABNORMAL HIGH (ref 39.0–75.0)
PLATELETS: 211 10*3/uL (ref 140–400)
RBC: 3.84 10*6/uL — AB (ref 4.20–5.82)
RDW: 16 % — ABNORMAL HIGH (ref 11.0–14.6)
WBC: 11.1 10*3/uL — ABNORMAL HIGH (ref 4.0–10.3)
lymph#: 1.1 10*3/uL (ref 0.9–3.3)

## 2017-09-04 MED ORDER — PALONOSETRON HCL INJECTION 0.25 MG/5ML
INTRAVENOUS | Status: AC
  Filled 2017-09-04: qty 5

## 2017-09-04 MED ORDER — FOSAPREPITANT DIMEGLUMINE INJECTION 150 MG
Freq: Once | INTRAVENOUS | Status: AC
  Administered 2017-09-04: 10:00:00 via INTRAVENOUS
  Filled 2017-09-04: qty 5

## 2017-09-04 MED ORDER — DIPHENHYDRAMINE HCL 50 MG/ML IJ SOLN
50.0000 mg | Freq: Once | INTRAMUSCULAR | Status: AC
  Administered 2017-09-04: 50 mg via INTRAVENOUS

## 2017-09-04 MED ORDER — SODIUM CHLORIDE 0.9 % IV SOLN: Freq: Once | INTRAVENOUS | Status: DC

## 2017-09-04 MED ORDER — SODIUM CHLORIDE 0.9 % IJ SOLN
10.0000 mL | INTRAMUSCULAR | Status: DC | PRN
  Administered 2017-09-04: 10 mL
  Filled 2017-09-04: qty 10

## 2017-09-04 MED ORDER — FAMOTIDINE IN NACL 20-0.9 MG/50ML-% IV SOLN
INTRAVENOUS | Status: AC
  Filled 2017-09-04: qty 50

## 2017-09-04 MED ORDER — SODIUM CHLORIDE 0.9 % IV SOLN
Freq: Once | INTRAVENOUS | Status: AC
  Administered 2017-09-04: 09:00:00 via INTRAVENOUS

## 2017-09-04 MED ORDER — FAMOTIDINE IN NACL 20-0.9 MG/50ML-% IV SOLN
20.0000 mg | Freq: Once | INTRAVENOUS | Status: AC
  Administered 2017-09-04: 20 mg via INTRAVENOUS

## 2017-09-04 MED ORDER — SODIUM CHLORIDE 0.9% FLUSH
10.0000 mL | INTRAVENOUS | Status: DC | PRN
  Administered 2017-09-04: 10 mL
  Filled 2017-09-04: qty 10

## 2017-09-04 MED ORDER — PACLITAXEL CHEMO INJECTION 300 MG/50ML
80.0000 mg/m2 | Freq: Once | INTRAVENOUS | Status: AC
  Administered 2017-09-04: 156 mg via INTRAVENOUS
  Filled 2017-09-04: qty 26

## 2017-09-04 MED ORDER — HEPARIN SOD (PORK) LOCK FLUSH 100 UNIT/ML IV SOLN
500.0000 [IU] | Freq: Once | INTRAVENOUS | Status: AC | PRN
  Administered 2017-09-04: 500 [IU]
  Filled 2017-09-04: qty 5

## 2017-09-04 MED ORDER — PALONOSETRON HCL INJECTION 0.25 MG/5ML
0.2500 mg | Freq: Once | INTRAVENOUS | Status: AC
  Administered 2017-09-04: 0.25 mg via INTRAVENOUS

## 2017-09-04 MED ORDER — DIPHENHYDRAMINE HCL 50 MG/ML IJ SOLN
INTRAMUSCULAR | Status: AC
  Filled 2017-09-04: qty 1

## 2017-09-04 MED FILL — OMEPRAZOLE 20 MG CAP: 20 | 30 days supply | Qty: 60 | Fill #5

## 2017-09-04 MED FILL — LEVOTHYROXINE 100 MCG TAB: 100 | 30 days supply | Qty: 30 | Fill #6

## 2017-09-04 NOTE — Progress Notes (Signed)
Pt reports SOB intermittently since his cold a few weeks ago. Pt states he is seeing his MD about his trach on 09/07/17 and does not feel as if he needs any intervention at this time and he will speak to MD. Dr. Alvy Bimler aware and no new orders at this time.

## 2017-09-04 NOTE — Patient Instructions (Signed)
[facility] Discharge Instructions for Patients Receiving Chemotherapy  Today you received the following chemotherapy agents:  Taxol.  To help prevent nausea and vomiting after your treatment, we encourage you to take your nausea medication as directed.   If you develop nausea and vomiting that is not controlled by your nausea medication, call the clinic.   BELOW ARE SYMPTOMS THAT SHOULD BE REPORTED IMMEDIATELY:  *FEVER GREATER THAN 100.5 F  *CHILLS WITH OR WITHOUT FEVER  NAUSEA AND VOMITING THAT IS NOT CONTROLLED WITH YOUR NAUSEA MEDICATION  *UNUSUAL SHORTNESS OF BREATH  *UNUSUAL BRUISING OR BLEEDING  TENDERNESS IN MOUTH AND THROAT WITH OR WITHOUT PRESENCE OF ULCERS  *URINARY PROBLEMS  *BOWEL PROBLEMS  UNUSUAL RASH Items with * indicate a potential emergency and should be followed up as soon as possible.  Feel free to call the clinic should you have any questions or concerns. The clinic phone number is [phone].  Please show the CHEMO ALERT CARD at check-in to the Emergency Department and triage nurse.   

## 2017-09-05 ENCOUNTER — Ambulatory Visit: Payer: Self-pay

## 2017-09-05 ENCOUNTER — Telehealth: Payer: Self-pay

## 2017-09-05 NOTE — Telephone Encounter (Signed)
Pt called to cancel fluids today.  Tomorrow he has CT at 1300.  He has an appt with Dr [name] on Thursday and could get results from him, pt prefers to hear results from Dr Alvy Bimler, if possible.

## 2017-09-05 NOTE — Telephone Encounter (Signed)
OK to cancel I will call once I have the CT result

## 2017-09-06 ENCOUNTER — Ambulatory Visit (HOSPITAL_COMMUNITY)
Admission: RE | Admit: 2017-09-06 | Discharge: 2017-09-06 | Disposition: A | Discharge status: Home / Self Care | Payer: Medicaid Other | Source: Ambulatory Visit | Attending: Hematology and Oncology | Admitting: Hematology and Oncology

## 2017-09-06 ENCOUNTER — Encounter (HOSPITAL_COMMUNITY): Payer: Self-pay

## 2017-09-06 ENCOUNTER — Ambulatory Visit: Payer: Self-pay

## 2017-09-06 DIAGNOSIS — Z930 Tracheostomy status: Secondary | ICD-10-CM | POA: Diagnosis not present

## 2017-09-06 DIAGNOSIS — M8580 Other specified disorders of bone density and structure, unspecified site: Secondary | ICD-10-CM | POA: Insufficient documentation

## 2017-09-06 DIAGNOSIS — C321 Malignant neoplasm of supraglottis: Secondary | ICD-10-CM | POA: Insufficient documentation

## 2017-09-06 DIAGNOSIS — E041 Nontoxic single thyroid nodule: Secondary | ICD-10-CM | POA: Insufficient documentation

## 2017-09-06 MED ORDER — HEPARIN SOD (PORK) LOCK FLUSH 100 UNIT/ML IV SOLN
INTRAVENOUS | Status: AC
  Filled 2017-09-06: qty 5

## 2017-09-06 MED ORDER — IOPAMIDOL (ISOVUE-300) INJECTION 61%
INTRAVENOUS | Status: AC
  Filled 2017-09-06: qty 100

## 2017-09-06 MED ORDER — IOPAMIDOL (ISOVUE-300) INJECTION 61%
INTRAVENOUS | Status: AC
  Administered 2017-09-06: 75 mL via INTRAVENOUS
  Filled 2017-09-06: qty 75

## 2017-09-06 MED ORDER — IOPAMIDOL (ISOVUE-300) INJECTION 61%
75.0000 mL | Freq: Once | INTRAVENOUS | Status: AC | PRN
  Administered 2017-09-06: 75 mL via INTRAVENOUS

## 2017-09-06 MED ORDER — HEPARIN SOD (PORK) LOCK FLUSH 100 UNIT/ML IV SOLN
500.0000 [IU] | Freq: Once | INTRAVENOUS | Status: AC
  Administered 2017-09-06: 500 [IU] via INTRAVENOUS

## 2017-09-07 ENCOUNTER — Telehealth: Payer: Self-pay | Admitting: Hematology and Oncology

## 2017-09-07 MED FILL — OMEPRAZOLE DR 40 MG CAPSULE: 40 | 30 days supply | Qty: 60 | Fill #0

## 2017-09-07 NOTE — Telephone Encounter (Signed)
I have reviewed imaging study with the patient over the telephone That is new thyroid nodule, nonspecific There is a lot of subcutaneous edema in his neck making interpretation of imaging difficult Overall, there is no significant change compared to his CT scan from June If he has appointment to see ENT physician today I told him it is still important to get visual inspection to rule out local disease He expressed understanding

## 2017-09-15 ENCOUNTER — Other Ambulatory Visit: Payer: Self-pay | Admitting: Hematology and Oncology

## 2017-09-15 ENCOUNTER — Ambulatory Visit (HOSPITAL_COMMUNITY): Payer: Medicaid Other

## 2017-09-15 ENCOUNTER — Telehealth: Payer: Self-pay

## 2017-09-15 DIAGNOSIS — G893 Neoplasm related pain (acute) (chronic): Secondary | ICD-10-CM

## 2017-09-15 DIAGNOSIS — C321 Malignant neoplasm of supraglottis: Secondary | ICD-10-CM

## 2017-09-15 MED ORDER — MORPHINE SULFATE 30 MG PO TABS: 60.0000 mg | ORAL_TABLET | ORAL | 0 refills | Status: DC | PRN

## 2017-09-15 MED ORDER — MORPHINE SULFATE ER 100 MG PO TBCR
100.0000 mg | EXTENDED_RELEASE_TABLET | Freq: Three times a day (TID) | ORAL | 0 refills | Status: DC
Start: 2017-09-15 — End: 2018-01-18

## 2017-09-15 MED ORDER — MORPHINE SULFATE ER 100 MG PO TBCR: 100.0000 mg | EXTENDED_RELEASE_TABLET | Freq: Three times a day (TID) | ORAL | 0 refills | Status: DC

## 2017-09-15 MED FILL — MORPHINE SULF ER 100 MG TAB: 100 | 30 days supply | Qty: 90 | Fill #0

## 2017-09-15 MED FILL — MORPHINE SULFATE IR 30 MG T: 30 | 7 days supply | Qty: 90 | Fill #0

## 2017-09-15 NOTE — Telephone Encounter (Signed)
I will increase MS Contin to 100 mg TID and continue to take MSIR  60 mg as needed every 4 hours Prescription ready for pick up

## 2017-09-15 NOTE — Telephone Encounter (Signed)
Pt is asking for refill on MS contin and MSIR. He is using 3 tabs MSIR every four hours, not all the time. He is wondering if the MS contin is working. He is thinking he may need something stronger. Or not having to take as many pills.  He is getting upset that he is having to take so many pills. Worry about being addicted. And just the quantity. "I don't want to be a pill pusher"  He is seeing Dr Alvy Bimler on Monday, but does not have enough to get through the weekend.   He does have a call out to Dr [name] b/c his trach was recently changed and it is hurting him extremely.

## 2017-09-15 NOTE — Telephone Encounter (Signed)
S/w pt about medication change, and rx ready for pickup.  He has not heard from Dr [name] yet, instructed him to call again.

## 2017-09-18 ENCOUNTER — Ambulatory Visit (HOSPITAL_BASED_OUTPATIENT_CLINIC_OR_DEPARTMENT_OTHER): Payer: Medicaid Other

## 2017-09-18 ENCOUNTER — Other Ambulatory Visit (HOSPITAL_BASED_OUTPATIENT_CLINIC_OR_DEPARTMENT_OTHER): Payer: Medicaid Other

## 2017-09-18 ENCOUNTER — Ambulatory Visit: Payer: Medicaid Other

## 2017-09-18 ENCOUNTER — Encounter: Payer: Self-pay | Admitting: Hematology and Oncology

## 2017-09-18 ENCOUNTER — Telehealth: Payer: Self-pay | Admitting: Hematology and Oncology

## 2017-09-18 ENCOUNTER — Ambulatory Visit (HOSPITAL_BASED_OUTPATIENT_CLINIC_OR_DEPARTMENT_OTHER): Payer: Medicaid Other | Admitting: Hematology and Oncology

## 2017-09-18 DIAGNOSIS — Z930 Tracheostomy status: Secondary | ICD-10-CM

## 2017-09-18 DIAGNOSIS — C321 Malignant neoplasm of supraglottis: Secondary | ICD-10-CM | POA: Diagnosis present

## 2017-09-18 DIAGNOSIS — Z79899 Other long term (current) drug therapy: Secondary | ICD-10-CM | POA: Diagnosis not present

## 2017-09-18 DIAGNOSIS — G893 Neoplasm related pain (acute) (chronic): Secondary | ICD-10-CM | POA: Diagnosis not present

## 2017-09-18 DIAGNOSIS — G620 Drug-induced polyneuropathy: Secondary | ICD-10-CM

## 2017-09-18 DIAGNOSIS — Z5111 Encounter for antineoplastic chemotherapy: Secondary | ICD-10-CM

## 2017-09-18 DIAGNOSIS — T451X5A Adverse effect of antineoplastic and immunosuppressive drugs, initial encounter: Secondary | ICD-10-CM

## 2017-09-18 LAB — CBC WITH DIFFERENTIAL/PLATELET
BASO%: 0.2 % (ref 0.0–2.0)
BASOS ABS: 0 10*3/uL (ref 0.0–0.1)
EOS ABS: 0 10*3/uL (ref 0.0–0.5)
EOS%: 0.4 % (ref 0.0–7.0)
HEMATOCRIT: 37.4 % — AB (ref 38.4–49.9)
HGB: 12.7 g/dL — ABNORMAL LOW (ref 13.0–17.1)
LYMPH%: 4.7 % — ABNORMAL LOW (ref 14.0–49.0)
MCH: 32.6 pg (ref 27.2–33.4)
MCHC: 33.9 g/dL (ref 32.0–36.0)
MCV: 96.2 fL (ref 79.3–98.0)
MONO#: 0.7 10*3/uL (ref 0.1–0.9)
MONO%: 6.8 % (ref 0.0–14.0)
NEUT#: 9.2 10*3/uL — ABNORMAL HIGH (ref 1.5–6.5)
NEUT%: 87.9 % — ABNORMAL HIGH (ref 39.0–75.0)
PLATELETS: 207 10*3/uL (ref 140–400)
RBC: 3.88 10*6/uL — ABNORMAL LOW (ref 4.20–5.82)
RDW: 16.5 % — AB (ref 11.0–14.6)
WBC: 10.5 10*3/uL — ABNORMAL HIGH (ref 4.0–10.3)
lymph#: 0.5 10*3/uL — ABNORMAL LOW (ref 0.9–3.3)

## 2017-09-18 LAB — COMPREHENSIVE METABOLIC PANEL
ALK PHOS: 65 U/L (ref 40–150)
ALT: 12 U/L (ref 0–55)
ANION GAP: 9 meq/L (ref 3–11)
AST: 12 U/L (ref 5–34)
Albumin: 3.8 g/dL (ref 3.5–5.0)
BUN: 12.1 mg/dL (ref 7.0–26.0)
CALCIUM: 9.8 mg/dL (ref 8.4–10.4)
CHLORIDE: 105 meq/L (ref 98–109)
CO2: 27 mEq/L (ref 22–29)
Creatinine: 0.7 mg/dL (ref 0.7–1.3)
Glucose: 114 mg/dl (ref 70–140)
Potassium: 3.9 mEq/L (ref 3.5–5.1)
Sodium: 141 mEq/L (ref 136–145)
Total Bilirubin: 0.33 mg/dL (ref 0.20–1.20)
Total Protein: 7.1 g/dL (ref 6.4–8.3)

## 2017-09-18 LAB — TSH: TSH: 0.45 m[IU]/L (ref 0.320–4.118)

## 2017-09-18 MED ORDER — SODIUM CHLORIDE 0.9 % IV SOLN
Freq: Once | INTRAVENOUS | Status: AC
  Administered 2017-09-18: 12:00:00 via INTRAVENOUS

## 2017-09-18 MED ORDER — SODIUM CHLORIDE 0.9% FLUSH
10.0000 mL | INTRAVENOUS | Status: DC | PRN
  Administered 2017-09-18: 10 mL
  Filled 2017-09-18: qty 10

## 2017-09-18 MED ORDER — DIPHENHYDRAMINE HCL 50 MG/ML IJ SOLN
INTRAMUSCULAR | Status: AC
  Filled 2017-09-18: qty 1

## 2017-09-18 MED ORDER — DIPHENHYDRAMINE HCL 50 MG/ML IJ SOLN
50.0000 mg | Freq: Once | INTRAMUSCULAR | Status: AC
  Administered 2017-09-18: 50 mg via INTRAVENOUS

## 2017-09-18 MED ORDER — FOSAPREPITANT DIMEGLUMINE INJECTION 150 MG
Freq: Once | INTRAVENOUS | Status: AC
  Administered 2017-09-18: 13:00:00 via INTRAVENOUS
  Filled 2017-09-18: qty 5

## 2017-09-18 MED ORDER — PALONOSETRON HCL INJECTION 0.25 MG/5ML
INTRAVENOUS | Status: AC
  Filled 2017-09-18: qty 5

## 2017-09-18 MED ORDER — HYDROMORPHONE HCL 2 MG/ML IJ SOLN
INTRAMUSCULAR | Status: AC
Start: 2017-09-18 — End: 2017-09-18
  Filled 2017-09-18: qty 1

## 2017-09-18 MED ORDER — FAMOTIDINE IN NACL 20-0.9 MG/50ML-% IV SOLN
INTRAVENOUS | Status: AC
  Filled 2017-09-18: qty 50

## 2017-09-18 MED ORDER — SODIUM CHLORIDE 0.9 % IV SOLN
80.0000 mg/m2 | Freq: Once | INTRAVENOUS | Status: AC
  Administered 2017-09-18: 156 mg via INTRAVENOUS
  Filled 2017-09-18: qty 26

## 2017-09-18 MED ORDER — HEPARIN SOD (PORK) LOCK FLUSH 100 UNIT/ML IV SOLN
500.0000 [IU] | Freq: Once | INTRAVENOUS | Status: AC | PRN
  Administered 2017-09-18: 500 [IU]
  Filled 2017-09-18: qty 5

## 2017-09-18 MED ORDER — FAMOTIDINE IN NACL 20-0.9 MG/50ML-% IV SOLN
20.0000 mg | Freq: Once | INTRAVENOUS | Status: AC
  Administered 2017-09-18: 20 mg via INTRAVENOUS

## 2017-09-18 MED ORDER — PALONOSETRON HCL INJECTION 0.25 MG/5ML
0.2500 mg | Freq: Once | INTRAVENOUS | Status: AC
  Administered 2017-09-18: 0.25 mg via INTRAVENOUS

## 2017-09-18 MED ORDER — HYDROMORPHONE HCL 4 MG/ML IJ SOLN
2.0000 mg | Freq: Once | INTRAMUSCULAR | Status: AC
  Administered 2017-09-18: 2 mg via INTRAVENOUS
  Filled 2017-09-18: qty 1

## 2017-09-18 NOTE — Assessment & Plan Note (Signed)
he has mild peripheral neuropathy, likely related to side effects of treatment. It is only mild, not bothering the patient. I will observe for now If it gets worse in the future, I will consider modifying the dose of the treatment  

## 2017-09-18 NOTE — Assessment & Plan Note (Signed)
He has severe pain related to tracheostomy placement I increase his pain medicine substantially last week and he is better controlled now Continue the same medication without dose adjustment

## 2017-09-18 NOTE — Patient Instructions (Signed)
[facility] Discharge Instructions for Patients Receiving Chemotherapy  Today you received the following chemotherapy agents:  Taxol.  To help prevent nausea and vomiting after your treatment, we encourage you to take your nausea medication as directed.   If you develop nausea and vomiting that is not controlled by your nausea medication, call the clinic.   BELOW ARE SYMPTOMS THAT SHOULD BE REPORTED IMMEDIATELY:  *FEVER GREATER THAN 100.5 F  *CHILLS WITH OR WITHOUT FEVER  NAUSEA AND VOMITING THAT IS NOT CONTROLLED WITH YOUR NAUSEA MEDICATION  *UNUSUAL SHORTNESS OF BREATH  *UNUSUAL BRUISING OR BLEEDING  TENDERNESS IN MOUTH AND THROAT WITH OR WITHOUT PRESENCE OF ULCERS  *URINARY PROBLEMS  *BOWEL PROBLEMS  UNUSUAL RASH Items with * indicate a potential emergency and should be followed up as soon as possible.  Feel free to call the clinic should you have any questions or concerns. The clinic phone number is [phone].  Please show the CHEMO ALERT CARD at check-in to the Emergency Department and triage nurse.   

## 2017-09-18 NOTE — Patient Instructions (Signed)
Implanted Port Home Guide An implanted port is a type of central line that is placed under the skin. Central lines are used to provide IV access when treatment or nutrition needs to be given through a person's veins. Implanted ports are used for long-term IV access. An implanted port may be placed because:  You need IV medicine that would be irritating to the small veins in your hands or arms.  You need long-term IV medicines, such as antibiotics.  You need IV nutrition for a long period.  You need frequent blood draws for lab tests.  You need dialysis.  Implanted ports are usually placed in the chest area, but they can also be placed in the upper arm, the abdomen, or the leg. An implanted port has two main parts:  Reservoir. The reservoir is round and will appear as a small, raised area under your skin. The reservoir is the part where a needle is inserted to give medicines or draw blood.  Catheter. The catheter is a thin, flexible tube that extends from the reservoir. The catheter is placed into a large vein. Medicine that is inserted into the reservoir goes into the catheter and then into the vein.  How will I care for my incision site? Do not get the incision site wet. Bathe or shower as directed by your health care provider. How is my port accessed? Special steps must be taken to access the port:  Before the port is accessed, a numbing cream can be placed on the skin. This helps numb the skin over the port site.  Your health care provider uses a sterile technique to access the port. ? Your health care provider must put on a mask and sterile gloves. ? The skin over your port is cleaned carefully with an antiseptic and allowed to dry. ? The port is gently pinched between sterile gloves, and a needle is inserted into the port.  Only "non-coring" port needles should be used to access the port. Once the port is accessed, a blood return should be checked. This helps ensure that the port  is in the vein and is not clogged.  If your port needs to remain accessed for a constant infusion, a clear (transparent) bandage will be placed over the needle site. The bandage and needle will need to be changed every week, or as directed by your health care provider.  Keep the bandage covering the needle clean and dry. Do not get it wet. Follow your health care provider's instructions on how to take a shower or bath while the port is accessed.  If your port does not need to stay accessed, no bandage is needed over the port.  What is flushing? Flushing helps keep the port from getting clogged. Follow your health care provider's instructions on how and when to flush the port. Ports are usually flushed with saline solution or a medicine called heparin. The need for flushing will depend on how the port is used.  If the port is used for intermittent medicines or blood draws, the port will need to be flushed: ? After medicines have been given. ? After blood has been drawn. ? As part of routine maintenance.  If a constant infusion is running, the port may not need to be flushed.  How long will my port stay implanted? The port can stay in for as long as your health care provider thinks it is needed. When it is time for the port to come out, surgery will be   done to remove it. The procedure is similar to the one performed when the port was put in. When should I seek immediate medical care? When you have an implanted port, you should seek immediate medical care if:  You notice a bad smell coming from the incision site.  You have swelling, redness, or drainage at the incision site.  You have more swelling or pain at the port site or the surrounding area.  You have a fever that is not controlled with medicine.  This information is not intended to replace advice given to you by your health care provider. Make sure you discuss any questions you have with your health care provider. Document  Released: 11/28/2005 Document Revised: 05/05/2016 Document Reviewed: 08/05/2013 Elsevier Interactive Patient Education  2017 [employer]  

## 2017-09-18 NOTE — Progress Notes (Signed)
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[place])   03/15/2015 - 03/17/2015 Hospital Admission    He was admitted to the hospital for evaluation of dysphagia, SOB, hemoptosis, hoarseness, 30-40 pound weight loss and worsening bilateral neck masses for 5 months      03/15/2015 Imaging    Ct showed extensive circumferential malignancy in the hypopharyngeal/supraglottic region with regional LN metastases      03/16/2015 Procedure    He underwent ULTRASOUND-GUIDED BIOPSY OF LEFT CERVICAL LYMPH NODES      03/16/2015 Pathology Results    Accession: [id number] LN biopsy showed invasive squamous cell cancer      03/16/2015 Pathology Results    Accession: [id number] showed atypical squamous cells      03/25/2015 - 04/07/2015 Hospital Admission    He was admitted to the hospital and underwent tracheostomy placement, feeeding tube placement but subsequently left [facility]      03/26/2015 Surgery    He had multiple extraction of tooth numbers 1, 2, 5, 6, 7, 8, 9, 10, 11, 12, 13, 18, 19, 21, 22, 23, 24, 25, 26, 27, 28, and 29. and 4 Quadrants of alveoloplasty      03/26/2015 Surgery    He underwent tracheostomy      03/31/2015 Surgery    He had open gastrostomy tube placement by Dr. Donne Hazel      04/16/2015 - 05/18/2015 Radiation Therapy    Laryngopharynx and bilateral neck / 50 Gy in 20 fractions to gross disease, 45 Gy in 20 fractions to high risk nodal echelons  Beams/energy: Helical IMRT / 6 MV photons      04/16/2015 Procedure    Fluoroscopic reposition of the 18 French gastrostomy confirmed back in the stomach,       07/03/2015 Procedure    IR performed replacement of gastrostomy tube with a new 29 French balloon retention tube      10/01/2015 Imaging    PEt scan showed persistent hypermetabolism within the primary supraglottic laryngeal tumor and within right retropharyngeal, bilateral level II and left level IV cervical nodal metastases      10/28/2015 Procedure    He had placement of PICC line. The IR was not able to place PORT due to suspected upper respiratory infection      11/13/2015 - 03/21/2016 Chemotherapy    He received 5FU, carboplatin chemo with weekly Erbitux      11/19/2015 Surgery    Gastrostomy tube replaced.      11/30/2015 Procedure    Placement of right jugular port-a-cath.      11/30/2015 Procedure    PICC removed.      12/30/2015 Imaging    PET CT showed positive response to Rx      02/26/2016 Procedure    He underwent direct laryngoscopy with biopsy. Esophageal dilatation.       02/26/2016 Pathology Results    Repeat biopsy of supraglottis showed persistent disease      02/29/2016 Procedure    Gastrostomy tube exchanged.      03/28/2016 PET scan    Hypermetabolic tissue in the posterior RIGHT hypopharynx is similar in pattern to PET-CT of 12/30/2015 but increased in metabolic activity.2. Hypermetabolic  tissue / lymph nodes in the LEFT supraclavicular nodal station are in a similar pattern       04/04/2016 - 11/29/2016 Chemotherapy    He started on palliative chemotherapy with pembrolizumab      05/03/2016 Imaging    MRI head is negative      06/02/2016 Imaging    Mild improvement of diffuse pharyngeal and supraglottic edema.Increased asymmetry of right-sided hypopharyngeal/supraglottic softtissue compared to the prior neck CT with FDG uptake in this region on prior PET-CT. Residual/recurrent tumor is possible      08/09/2016 Procedure    IR placed new 20 French percutaneous gastrostomy tube.      08/26/2016 Imaging    Ct neck showed unchanged diffuse  pharyngeal and supraglottic edema as well as asymmetric soft tissue thickening on the right. Slightly decreased size of some left level II lymph nodes. Unchanged lymphadenopathy elsewhere in the neck. Decreased size of thyroid mass. No evidence of metastatic cancer to the chest      12/13/2016 Imaging    Ct chest showed no evidence of thoracic metastatic disease or primary thoracic malignancy.      12/13/2016 Imaging    Ct neck showed progression of of RIGHT supraglottic mass compared with priors, approximate size 24 x 27 x 27 mm. Extension caudally along the RIGHT area epiglottic fold with increasing mass effect on the airway. Tracheostomy satisfactory position. Stable to slightly improved malignant adenopathy.      12/26/2016 -  Chemotherapy    He received chemotherapy with weekly Taxol       02/10/2017 Imaging    Very similar appearance to the study of January. Right sided supraglottic to glottic mass is similar, perhaps a few mm smaller. Bilateral enlarged nodes appear the same. No progressive finding      02/10/2017 Imaging    No evidence of metastatic disease in the abdomen/pelvis. Percutaneous gastrostomy in satisfactory position. Cholelithiasis, without associated inflammatory changes.      06/06/2017 Imaging    CT neck: Supraglottic tumor on the right is mildly smaller now measuring 23 x 16 mm. Improvement in bilateral cervical adenopathy. No new adenopathy identified      09/06/2017 Imaging    1. Essentially stable post treatment CT appearance of the neck since June (see #3). 2. Residual right supraglottic soft tissue asymmetry and indistinct 16 mm right level II nodal tissue is superimposed on post treatment changes including diffuse pharyngeal mucosal space soft tissue thickening. 3. A 15 mm round low-density mass in the left thyroid region is slowly enlarging and favored to be a thyroid nodule rather than on necrotic lymph node. Attention on follow-up Neck CT versus dedicated Thyroid  Ultrasound recommended.       INTERVAL HISTORY: Please see below for problem oriented charting. He returns for further follow-up He complained mild peripheral neuropathy With increased pain medicine, the tightness and pain around the tracheostomy site that are controlled Denies constipation or sedation He had no recent nausea or vomiting  REVIEW OF SYSTEMS:   Constitutional: Denies fevers, chills or abnormal weight loss Eyes: Denies blurriness of vision Ears, nose, mouth, throat, and face: Denies mucositis or sore throat Respiratory: Denies cough, dyspnea or wheezes Cardiovascular: Denies palpitation, chest discomfort or lower extremity swelling Gastrointestinal:  Denies nausea, heartburn or change in bowel habits Skin: Denies abnormal skin rashes Lymphatics: Denies new lymphadenopathy or easy bruising Neurological:Denies numbness, tingling or new weaknesses Behavioral/Psych: Mood is stable, no new changes  All other systems were reviewed with the patient  and are negative.  I have reviewed the past medical history, past surgical history, social history and family history with the patient and they are unchanged from previous note.  ALLERGIES:  is allergic to aspirin.  MEDICATIONS:  Current Outpatient Prescriptions  Medication Sig Dispense Refill  . amoxicillin (AMOXIL) 250 MG/5ML suspension Place 10 mLs (500 mg total) into feeding tube 2 (two) times daily. For 7 days 140 mL 0  . fluticasone (FLONASE) 50 MCG/ACT nasal spray Place 2 sprays into both nostrils daily. 16 g 2  . levothyroxine (SYNTHROID) 100 MCG tablet Take 1 tablet (100 mcg total) by mouth daily before breakfast. 30 tablet 9  . morphine (MS CONTIN) 100 MG 12 hr tablet Take 1 tablet (100 mg total) by mouth every 8 (eight) hours. 90 tablet 0  . morphine (MSIR) 30 MG tablet Take 2 tablets (60 mg total) by mouth every 4 (four) hours as needed for severe pain. 90 tablet 0  . Nutritional Supplements (FEEDING SUPPLEMENT,  JEVITY 1.5 CAL/FIBER,) LIQD Give 1 can Osmolite 1.2 + 1 can of Jevity 1.5 QID via PEG with 60 cc free water before and after bolus. Flush with 240 cc free water BID between feedings. 948 mL   . omeprazole (PRILOSEC) 40 MG capsule Take 1 capsule (40 mg total) by mouth daily. 90 capsule 3  . ondansetron (ZOFRAN) 8 MG tablet TAKE 1 TABLET BY MOUTH EVERY 8 HOURS AS NEEDED FOR NAUSEA 60 tablet 3  . scopolamine (TRANSDERM-SCOP, 1.5 MG,) 1 MG/3DAYS Place 1 patch (1.5 mg total) onto the skin every 3 (three) days. 10 patch 12  . sodium chloride irrigation 0.9 % irrigation USE TO CLEAN AROUND TRACH AND PERFORM TRACH CARE ONCE DAILY AND AS NEEDED 1000 mL 11  . triamcinolone (NASACORT AQ) 55 MCG/ACT AERO nasal inhaler Place 2 sprays into the nose daily. 1 Inhaler 12   Current Facility-Administered Medications  Medication Dose Route Frequency Provider Last Rate Last Dose  . guaiFENesin-dextromethorphan (ROBITUSSIN DM) 100-10 MG/5ML syrup 10 mL  10 mL Oral Q4H PRN Alvy Bimler, Bhavesh Vazquez, MD   10 mL at 01/09/17 1427   Facility-Administered Medications Ordered in Other Visits  Medication Dose Route Frequency Provider Last Rate Last Dose  . 0.9 %  sodium chloride infusion   Intravenous Once Dimetrius Montfort, MD      . alteplase (CATHFLO ACTIVASE) injection 2 mg  2 mg Intracatheter Once PRN Alvy Bimler, Anberlyn Feimster, MD      . heparin lock flush 100 unit/mL  500 Units Intracatheter Once PRN Alvy Bimler, Keionna Kinnaird, MD      . heparin lock flush 100 unit/mL  250 Units Intracatheter Once PRN Alvy Bimler, Faun Mcqueen, MD      . HYDROmorphone (DILAUDID) injection 2 mg  2 mg Intravenous Q2H PRN Alvy Bimler, Alta Shober, MD   2 mg at 12/26/16 1440  . ondansetron (ZOFRAN) injection 8 mg  8 mg Intravenous Once Drue Second R, NP      . sodium chloride 0.9 % injection 10 mL  10 mL Intracatheter PRN Alvy Bimler, Taralynn Quiett, MD   10 mL at 01/02/17 1107  . sodium chloride 0.9 % injection 10 mL  10 mL Intracatheter PRN Jovee Dettinger, MD      . sodium chloride flush (NS) 0.9 % injection 10 mL  10 mL  Intracatheter PRN Alvy Bimler, Kayse Puccini, MD   10 mL at 12/26/16 1708  . sodium chloride flush (NS) 0.9 % injection 10 mL  10 mL Intracatheter PRN Alvy Bimler, Rayshun Kandler, MD   10 mL at 07/24/17 1704  PHYSICAL EXAMINATION: ECOG PERFORMANCE STATUS: 1 - Symptomatic but completely ambulatory  Vitals:   09/18/17 1104  BP: 131/75  Pulse: 73  Resp: 20  Temp: 99.3 F (37.4 C)  SpO2: 100%   Filed Weights   09/18/17 1104  Weight: 172 lb 14.4 oz (78.4 kg)    GENERAL:alert, no distress and comfortable SKIN: skin color, texture, turgor are normal, no rashes or significant lesions EYES: normal, Conjunctiva are pink and non-injected, sclera clear OROPHARYNX:no exudate, no erythema and lips, buccal mucosa, and tongue normal  NECK: Tracheostomy site looks okay without signs of bleeding or infection  lYMPH:  no palpable lymphadenopathy in the cervical, axillary or inguinal LUNGS: clear to auscultation and percussion with normal breathing effort HEART: regular rate & rhythm and no murmurs and no lower extremity edema ABDOMEN:abdomen soft, non-tender and normal bowel sounds Musculoskeletal:no cyanosis of digits and no clubbing  NEURO: alert & oriented x 3 with fluent speech, no focal motor/sensory deficits  LABORATORY DATA:  I have reviewed the data as listed    Component Value Date/Time   NA 141 09/18/2017 1023   K 3.9 09/18/2017 1023   CL 100 06/28/2017 1642   CO2 27 09/18/2017 1023   GLUCOSE 114 09/18/2017 1023   BUN 12.1 09/18/2017 1023   CREATININE 0.7 09/18/2017 1023   CALCIUM 9.8 09/18/2017 1023   PROT 7.1 09/18/2017 1023   ALBUMIN 3.8 09/18/2017 1023   AST 12 09/18/2017 1023   ALT 12 09/18/2017 1023   ALKPHOS 65 09/18/2017 1023   BILITOT 0.33 09/18/2017 1023   GFRNONAA 98 06/28/2017 1642   GFRAA 114 06/28/2017 1642    No results found for: SPEP, UPEP  Lab Results  Component Value Date   WBC 10.5 (H) 09/18/2017   NEUTROABS 9.2 (H) 09/18/2017   HGB 12.7 (L) 09/18/2017   HCT 37.4 (L)  09/18/2017   MCV 96.2 09/18/2017   PLT 207 09/18/2017      Chemistry      Component Value Date/Time   NA 141 09/18/2017 1023   K 3.9 09/18/2017 1023   CL 100 06/28/2017 1642   CO2 27 09/18/2017 1023   BUN 12.1 09/18/2017 1023   CREATININE 0.7 09/18/2017 1023      Component Value Date/Time   CALCIUM 9.8 09/18/2017 1023   ALKPHOS 65 09/18/2017 1023   AST 12 09/18/2017 1023   ALT 12 09/18/2017 1023   BILITOT 0.33 09/18/2017 1023       RADIOGRAPHIC STUDIES: I have personally reviewed the radiological images as listed and agreed with the findings in the report. Ct Soft Tissue Neck W Contrast  Result Date: 09/06/2017 CLINICAL DATA:  56 year old male status post treatment of right supraglottic squamous cell carcinoma diagnosed in 2016. Radiation complete. Chemotherapy in progress. New tracheostomy tube 3.5 months ago. EXAM: CT NECK WITH CONTRAST TECHNIQUE: Multidetector CT imaging of the neck was performed using the standard protocol following the bolus administration of intravenous contrast. CONTRAST:  75 mL Isovue-300 COMPARISON:  Neck CT 06/06/2017 and earlier FINDINGS: The axial images on this study are again more like oblique coronals, related to chronic spinal kyphosis. Pharynx and larynx: Generalized pharyngeal mucosal space soft tissue thickening and edema is moderate to severe in the supraglottic larynx. Continued asymmetric right posterior laryngeal soft tissue asymmetry is noted without suspicious hyperenhancement. Asymmetric enlargement of the right laryngeal ventricle raises the possibility of right vocal cord paralysis. Pharyngeal soft tissue contours are stable and within normal limits. Negative parapharyngeal spaces. Stable retropharyngeal space  with mild soft tissue thickening or edema. Salivary glands: Stable submandibular glands and parotid glands. Stable sublingual space. Thyroid: Normal right thyroid lobe. Chronically diminutive left thyroid lobe. Larger hypodense left lobe  suspected left thyroid nodule (series 3, image 51) measuring 15 mm diameter now versus 11 mm in June. Lymph nodes: Post treatment mild obscuration of the bilateral cervical level 2 and level 3 lymph node stations. Residual 16 mm heterogeneous right level 2 lymph node tissue appears stable on series 3, image 30 today. No new or increased cervical nodes. Vascular: Major vascular structures in the neck and at the skullbase remain patent. Right IJ approach porta cath remains in place. Limited intracranial: Negative. Visualized orbits: Negative. Mastoids and visualized paranasal sinuses: Visualized paranasal sinuses and mastoids are stable and well pneumatized. Skeleton: Chronic upper thoracic and lower cervical kyphosis. Osteopenia. Absent dentition. No acute or suspicious osseous lesion identified. Upper chest: Midline tracheostomy tube positioning and configuration appears stable since June with no adverse features (series 9, image 72). The subglottic trachea, carina and visible mainstem bronchi are patent. Stable lung apices with mild chronic reticular soft tissue thickening. No superior mediastinal lymphadenopathy. IMPRESSION: 1. Essentially stable post treatment CT appearance of the neck since June (see #3). 2. Residual right supraglottic soft tissue asymmetry and indistinct 16 mm right level II nodal tissue is superimposed on post treatment changes including diffuse pharyngeal mucosal space soft tissue thickening. 3. A 15 mm round low-density mass in the left thyroid region is slowly enlarging and favored to be a thyroid nodule rather than on necrotic lymph node. Attention on follow-up Neck CT versus dedicated Thyroid Ultrasound recommended. Electronically Signed   By: Genevie Ann M.D.   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[place]) His recent CT in September showed stable disease He is experiencing a lot of pain and tightness around his neck The patient wants to  proceed with palliative chemotherapy today I recommend he consult with ENT service for further discussion about discomfort related to tracheostomy placement We will continue treatment every other week and a plan to repeat imaging study at the end of the year  Cancer associated pain He has severe pain related to tracheostomy placement I increase his pain medicine substantially last week and he is better controlled now Continue the same medication without dose adjustment  Tracheostomy status ([place]) He has significant complain about tightness and discomfort Clinically, there is no signs of infection of bleeding to tracheostomy.  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 It is only mild, not bothering the patient. I will observe for now If it gets worse in the future, I will consider modifying the dose of the treatment    No orders of the defined types were placed in this encounter.  All questions were answered. The patient knows to call the clinic with any problems, questions or concerns. No barriers to learning was detected. I spent 15 minutes counseling the patient face to face. The total time spent in the appointment was 20 minutes and more than 50% was on counseling and review of test results     [name], MD 09/18/2017 12:11 PM

## 2017-09-18 NOTE — Assessment & Plan Note (Signed)
His recent CT in September showed stable disease He is experiencing a lot of pain and tightness around his neck The patient wants to proceed with palliative chemotherapy today I recommend he consult with ENT service for further discussion about discomfort related to tracheostomy placement We will continue treatment every other week and a plan to repeat imaging study at the end of the year

## 2017-09-18 NOTE — Telephone Encounter (Signed)
Scheduled appt per 10/8 los - Gave patient AVS and calender per los.  

## 2017-09-18 NOTE — Assessment & Plan Note (Signed)
He has significant complain about tightness and discomfort Clinically, there is no signs of infection of bleeding to tracheostomy.  I recommend ENT consult for evaluation

## 2017-10-02 ENCOUNTER — Encounter: Payer: Self-pay | Admitting: Nutrition

## 2017-10-02 ENCOUNTER — Ambulatory Visit: Payer: Self-pay

## 2017-10-02 ENCOUNTER — Other Ambulatory Visit: Payer: Self-pay

## 2017-10-02 ENCOUNTER — Telehealth: Payer: Self-pay

## 2017-10-02 NOTE — Telephone Encounter (Signed)
OK to cancel per pt Please inform treatment room

## 2017-10-02 NOTE — Progress Notes (Signed)
Patient's nutrition follow-up was canceled.

## 2017-10-02 NOTE — Telephone Encounter (Signed)
Cancel appt today, nothing wrong except out of food. Jevity 2 cases went bad - they won't pour out. He is waiting for the people who deliver it to call him.  Lab/flush/taxol/nutrition. Next appts 11/5

## 2017-10-06 MED FILL — LEVOTHYROXINE 100 MCG TAB: 100 | 30 days supply | Qty: 30 | Fill #7

## 2017-10-16 ENCOUNTER — Other Ambulatory Visit (HOSPITAL_BASED_OUTPATIENT_CLINIC_OR_DEPARTMENT_OTHER): Payer: Medicaid Other

## 2017-10-16 ENCOUNTER — Encounter: Payer: Self-pay | Admitting: Hematology and Oncology

## 2017-10-16 ENCOUNTER — Ambulatory Visit: Payer: Medicaid Other

## 2017-10-16 ENCOUNTER — Ambulatory Visit (HOSPITAL_BASED_OUTPATIENT_CLINIC_OR_DEPARTMENT_OTHER): Payer: Medicaid Other

## 2017-10-16 ENCOUNTER — Telehealth: Payer: Self-pay | Admitting: Hematology and Oncology

## 2017-10-16 ENCOUNTER — Ambulatory Visit (HOSPITAL_BASED_OUTPATIENT_CLINIC_OR_DEPARTMENT_OTHER): Payer: Medicaid Other | Admitting: Hematology and Oncology

## 2017-10-16 DIAGNOSIS — Z5111 Encounter for antineoplastic chemotherapy: Secondary | ICD-10-CM | POA: Diagnosis present

## 2017-10-16 DIAGNOSIS — C321 Malignant neoplasm of supraglottis: Secondary | ICD-10-CM

## 2017-10-16 DIAGNOSIS — G893 Neoplasm related pain (acute) (chronic): Secondary | ICD-10-CM

## 2017-10-16 DIAGNOSIS — Z930 Tracheostomy status: Secondary | ICD-10-CM

## 2017-10-16 DIAGNOSIS — Z430 Encounter for attention to tracheostomy: Secondary | ICD-10-CM

## 2017-10-16 LAB — CBC WITH DIFFERENTIAL/PLATELET
BASO%: 0.4 % (ref 0.0–2.0)
BASOS ABS: 0 10*3/uL (ref 0.0–0.1)
EOS ABS: 0.1 10*3/uL (ref 0.0–0.5)
EOS%: 0.7 % (ref 0.0–7.0)
HCT: 39.2 % (ref 38.4–49.9)
HGB: 13.2 g/dL (ref 13.0–17.1)
LYMPH%: 6.3 % — AB (ref 14.0–49.0)
MCH: 32.4 pg (ref 27.2–33.4)
MCHC: 33.7 g/dL (ref 32.0–36.0)
MCV: 96 fL (ref 79.3–98.0)
MONO#: 1 10*3/uL — AB (ref 0.1–0.9)
MONO%: 10.2 % (ref 0.0–14.0)
NEUT%: 82.4 % — AB (ref 39.0–75.0)
NEUTROS ABS: 8.3 10*3/uL — AB (ref 1.5–6.5)
PLATELETS: 222 10*3/uL (ref 140–400)
RBC: 4.08 10*6/uL — AB (ref 4.20–5.82)
RDW: 15.8 % — ABNORMAL HIGH (ref 11.0–14.6)
WBC: 10 10*3/uL (ref 4.0–10.3)
lymph#: 0.6 10*3/uL — ABNORMAL LOW (ref 0.9–3.3)

## 2017-10-16 LAB — COMPREHENSIVE METABOLIC PANEL
ALT: 10 U/L (ref 0–55)
AST: 13 U/L (ref 5–34)
Albumin: 3.8 g/dL (ref 3.5–5.0)
Alkaline Phosphatase: 62 U/L (ref 40–150)
Anion Gap: 8 mEq/L (ref 3–11)
BUN: 14.7 mg/dL (ref 7.0–26.0)
CALCIUM: 9.7 mg/dL (ref 8.4–10.4)
CHLORIDE: 105 meq/L (ref 98–109)
CO2: 28 meq/L (ref 22–29)
CREATININE: 0.8 mg/dL (ref 0.7–1.3)
EGFR: >60 mL/min/{1.73_m2} (ref >60)
GLUCOSE: 121 mg/dL (ref 70–140)
POTASSIUM: 3.7 meq/L (ref 3.5–5.1)
SODIUM: 140 meq/L (ref 136–145)
Total Bilirubin: 0.41 mg/dL (ref 0.20–1.20)
Total Protein: 7.2 g/dL (ref 6.4–8.3)

## 2017-10-16 MED ORDER — DIPHENHYDRAMINE HCL 50 MG/ML IJ SOLN
50.0000 mg | Freq: Once | INTRAMUSCULAR | Status: AC
  Administered 2017-10-16: 50 mg via INTRAVENOUS

## 2017-10-16 MED ORDER — HYDROMORPHONE HCL 2 MG/ML IJ SOLN
2.0000 mg | Freq: Once | INTRAMUSCULAR | Status: AC
  Administered 2017-10-16: 2 mg via INTRAVENOUS
  Filled 2017-10-16: qty 1

## 2017-10-16 MED ORDER — PALONOSETRON HCL INJECTION 0.25 MG/5ML
0.2500 mg | Freq: Once | INTRAVENOUS | Status: AC
  Administered 2017-10-16: 0.25 mg via INTRAVENOUS

## 2017-10-16 MED ORDER — HYDROMORPHONE HCL 2 MG/ML IJ SOLN
INTRAMUSCULAR | Status: AC
  Filled 2017-10-16: qty 1

## 2017-10-16 MED ORDER — SODIUM CHLORIDE 0.9 % IV SOLN
Freq: Once | INTRAVENOUS | Status: AC
  Administered 2017-10-16: 15:00:00 via INTRAVENOUS

## 2017-10-16 MED ORDER — FAMOTIDINE IN NACL 20-0.9 MG/50ML-% IV SOLN
INTRAVENOUS | Status: AC
  Filled 2017-10-16: qty 50

## 2017-10-16 MED ORDER — PALONOSETRON HCL INJECTION 0.25 MG/5ML
INTRAVENOUS | Status: AC
  Filled 2017-10-16: qty 5

## 2017-10-16 MED ORDER — SODIUM CHLORIDE 0.9 % IV SOLN
Freq: Once | INTRAVENOUS | Status: AC
  Administered 2017-10-16: 15:00:00 via INTRAVENOUS
  Filled 2017-10-16: qty 5

## 2017-10-16 MED ORDER — SODIUM CHLORIDE 0.9% FLUSH
10.0000 mL | Freq: Once | INTRAVENOUS | Status: AC
  Administered 2017-10-16: 10 mL
  Filled 2017-10-16: qty 10

## 2017-10-16 MED ORDER — FAMOTIDINE IN NACL 20-0.9 MG/50ML-% IV SOLN
20.0000 mg | Freq: Once | INTRAVENOUS | Status: AC
  Administered 2017-10-16: 20 mg via INTRAVENOUS

## 2017-10-16 MED ORDER — DIPHENHYDRAMINE HCL 50 MG/ML IJ SOLN
INTRAMUSCULAR | Status: AC
  Filled 2017-10-16: qty 1

## 2017-10-16 MED ORDER — SODIUM CHLORIDE 0.9% FLUSH
10.0000 mL | INTRAVENOUS | Status: DC | PRN
  Administered 2017-10-16: 10 mL
  Filled 2017-10-16: qty 10

## 2017-10-16 MED ORDER — MORPHINE SULFATE 30 MG PO TABS: 60.0000 mg | ORAL_TABLET | ORAL | 0 refills | Status: DC | PRN

## 2017-10-16 MED ORDER — SODIUM CHLORIDE 0.9 % IV SOLN
80.0000 mg/m2 | Freq: Once | INTRAVENOUS | Status: AC
  Administered 2017-10-16: 156 mg via INTRAVENOUS
  Filled 2017-10-16: qty 26

## 2017-10-16 MED ORDER — HEPARIN SOD (PORK) LOCK FLUSH 100 UNIT/ML IV SOLN
500.0000 [IU] | Freq: Once | INTRAVENOUS | Status: AC | PRN
  Administered 2017-10-16: 500 [IU]
  Filled 2017-10-16: qty 5

## 2017-10-16 MED FILL — MORPHINE SULFATE IR 30 MG T: 30 | 7 days supply | Qty: 90 | Fill #0

## 2017-10-16 MED FILL — FLUTICASONE PROP 50 MCG SPR: 50 | 30 days supply | Qty: 16 | Fill #1

## 2017-10-16 NOTE — Patient Instructions (Signed)
[facility] Discharge Instructions for Patients Receiving Chemotherapy  Today you received the following chemotherapy agents: Paclitaxel (Taxol)  To help prevent nausea and vomiting after your treatment, we encourage you to take your nausea medication as prescribed. If you develop nausea and vomiting that is not controlled by your nausea medication, call the clinic.   BELOW ARE SYMPTOMS THAT SHOULD BE REPORTED IMMEDIATELY:  *FEVER GREATER THAN 100.5 F  *CHILLS WITH OR WITHOUT FEVER  NAUSEA AND VOMITING THAT IS NOT CONTROLLED WITH YOUR NAUSEA MEDICATION  *UNUSUAL SHORTNESS OF BREATH  *UNUSUAL BRUISING OR BLEEDING  TENDERNESS IN MOUTH AND THROAT WITH OR WITHOUT PRESENCE OF ULCERS  *URINARY PROBLEMS  *BOWEL PROBLEMS  UNUSUAL RASH Items with * indicate a potential emergency and should be followed up as soon as possible.  Feel free to call the clinic should you have any questions or concerns. The clinic phone number is [phone].  Please show the CHEMO ALERT CARD at check-in to the Emergency Department and triage nurse.   

## 2017-10-16 NOTE — Assessment & Plan Note (Signed)
His recent CT in September showed stable disease He is experiencing a lot of pain and tightness around his neck The patient wants to proceed with palliative chemotherapy today I recommend he consult with ENT service for further discussion about discomfort related to tracheostomy placement We will continue treatment every other week and I plan to repeat imaging study at the end of the year

## 2017-10-16 NOTE — Assessment & Plan Note (Signed)
He has significant complain about tightness and discomfort Clinically, there is no signs of infection of bleeding to tracheostomy.  I recommend ENT consult for evaluation and management. He has number to call

## 2017-10-16 NOTE — Progress Notes (Signed)
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[place])   03/15/2015 - 03/17/2015 Hospital Admission    He was admitted to the hospital for evaluation of dysphagia, SOB, hemoptosis, hoarseness, 30-40 pound weight loss and worsening bilateral neck masses for 5 months      03/15/2015 Imaging    Ct showed extensive circumferential malignancy in the hypopharyngeal/supraglottic region with regional LN metastases      03/16/2015 Procedure    He underwent ULTRASOUND-GUIDED BIOPSY OF LEFT CERVICAL LYMPH NODES      03/16/2015 Pathology Results    Accession: [id number] LN biopsy showed invasive squamous cell cancer      03/16/2015 Pathology Results    Accession: [id number] showed atypical squamous cells      03/25/2015 - 04/07/2015 Hospital Admission    He was admitted to the hospital and underwent tracheostomy placement, feeeding tube placement but subsequently left [facility]      03/26/2015 Surgery    He had multiple extraction of tooth numbers 1, 2, 5, 6, 7, 8, 9, 10, 11, 12, 13, 18, 19, 21, 22, 23, 24, 25, 26, 27, 28, and 29. and 4 Quadrants of alveoloplasty      03/26/2015 Surgery    He underwent tracheostomy      03/31/2015 Surgery    He had open gastrostomy tube placement by Dr. Donne Hazel      04/16/2015 - 05/18/2015 Radiation Therapy    Laryngopharynx and bilateral neck / 50 Gy in 20 fractions to gross disease, 45 Gy in 20 fractions to high risk nodal echelons  Beams/energy: Helical IMRT / 6 MV photons      04/16/2015 Procedure    Fluoroscopic reposition of the 18 French gastrostomy confirmed back in the stomach,       07/03/2015 Procedure    IR performed replacement of gastrostomy tube with a new 1 French balloon retention tube      10/01/2015 Imaging    PEt scan showed persistent hypermetabolism within the primary supraglottic laryngeal tumor and within right retropharyngeal, bilateral level II and left level IV cervical nodal metastases      10/28/2015 Procedure    He had placement of PICC line. The IR was not able to place PORT due to suspected upper respiratory infection      11/13/2015 - 03/21/2016 Chemotherapy    He received 5FU, carboplatin chemo with weekly Erbitux      11/19/2015 Surgery    Gastrostomy tube replaced.      11/30/2015 Procedure    Placement of right jugular port-a-cath.      11/30/2015 Procedure    PICC removed.      12/30/2015 Imaging    PET CT showed positive response to Rx      02/26/2016 Procedure    He underwent direct laryngoscopy with biopsy. Esophageal dilatation.       02/26/2016 Pathology Results    Repeat biopsy of supraglottis showed persistent disease      02/29/2016 Procedure    Gastrostomy tube exchanged.      03/28/2016 PET scan    Hypermetabolic tissue in the posterior RIGHT hypopharynx is similar in pattern to PET-CT of 12/30/2015 but increased in metabolic activity.2. Hypermetabolic  tissue / lymph nodes in the LEFT supraclavicular nodal station are in a similar pattern       04/04/2016 - 11/29/2016 Chemotherapy    He started on palliative chemotherapy with pembrolizumab      05/03/2016 Imaging    MRI head is negative      06/02/2016 Imaging    Mild improvement of diffuse pharyngeal and supraglottic edema.Increased asymmetry of right-sided hypopharyngeal/supraglottic softtissue compared to the prior neck CT with FDG uptake in this region on prior PET-CT. Residual/recurrent tumor is possible      08/09/2016 Procedure    IR placed new 20 French percutaneous gastrostomy tube.      08/26/2016 Imaging    Ct neck showed unchanged diffuse  pharyngeal and supraglottic edema as well as asymmetric soft tissue thickening on the right. Slightly decreased size of some left level II lymph nodes. Unchanged lymphadenopathy elsewhere in the neck. Decreased size of thyroid mass. No evidence of metastatic cancer to the chest      12/13/2016 Imaging    Ct chest showed no evidence of thoracic metastatic disease or primary thoracic malignancy.      12/13/2016 Imaging    Ct neck showed progression of of RIGHT supraglottic mass compared with priors, approximate size 24 x 27 x 27 mm. Extension caudally along the RIGHT area epiglottic fold with increasing mass effect on the airway. Tracheostomy satisfactory position. Stable to slightly improved malignant adenopathy.      12/26/2016 -  Chemotherapy    He received chemotherapy with weekly Taxol       02/10/2017 Imaging    Very similar appearance to the study of January. Right sided supraglottic to glottic mass is similar, perhaps a few mm smaller. Bilateral enlarged nodes appear the same. No progressive finding      02/10/2017 Imaging    No evidence of metastatic disease in the abdomen/pelvis. Percutaneous gastrostomy in satisfactory position. Cholelithiasis, without associated inflammatory changes.      06/06/2017 Imaging    CT neck: Supraglottic tumor on the right is mildly smaller now measuring 23 x 16 mm. Improvement in bilateral cervical adenopathy. No new adenopathy identified      09/06/2017 Imaging    1. Essentially stable post treatment CT appearance of the neck since June (see #3). 2. Residual right supraglottic soft tissue asymmetry and indistinct 16 mm right level II nodal tissue is superimposed on post treatment changes including diffuse pharyngeal mucosal space soft tissue thickening. 3. A 15 mm round low-density mass in the left thyroid region is slowly enlarging and favored to be a thyroid nodule rather than on necrotic lymph node. Attention on follow-up Neck CT versus dedicated Thyroid  Ultrasound recommended.       INTERVAL HISTORY: Please see below for problem oriented charting. He returns for further follow-up He continues to have severe pain related to tracheostomy placement He tolerated chemo well No further severe nausea or vomiting His pain is well controlled with current prescription pain medicine He denies constipation No peripheral neuropathy from treatment  REVIEW OF SYSTEMS:   Constitutional: Denies fevers, chills or abnormal weight loss Eyes: Denies blurriness of vision Ears, nose, mouth, throat, and face: Denies mucositis or sore throat Respiratory: Denies cough, dyspnea or wheezes Cardiovascular: Denies palpitation, chest discomfort or lower extremity swelling Gastrointestinal:  Denies nausea, heartburn or change in bowel habits Skin: Denies abnormal skin rashes Lymphatics: Denies new lymphadenopathy or easy bruising Neurological:Denies numbness, tingling or new weaknesses Behavioral/Psych: Mood is stable, no new changes  All  other systems were reviewed with the patient and are negative.  I have reviewed the past medical history, past surgical history, social history and family history with the patient and they are unchanged from previous note.  ALLERGIES:  is allergic to aspirin.  MEDICATIONS:  Current Outpatient Medications  Medication Sig Dispense Refill  . amoxicillin (AMOXIL) 250 MG/5ML suspension Place 10 mLs (500 mg total) into feeding tube 2 (two) times daily. For 7 days 140 mL 0  . fluticasone (FLONASE) 50 MCG/ACT nasal spray Place 2 sprays into both nostrils daily. 16 g 2  . levothyroxine (SYNTHROID) 100 MCG tablet Take 1 tablet (100 mcg total) by mouth daily before breakfast. 30 tablet 9  . morphine (MS CONTIN) 100 MG 12 hr tablet Take 1 tablet (100 mg total) by mouth every 8 (eight) hours. 90 tablet 0  . morphine (MSIR) 30 MG tablet Take 2 tablets (60 mg total) every 4 (four) hours as needed by mouth for severe pain. 90 tablet 0  .  Nutritional Supplements (FEEDING SUPPLEMENT, JEVITY 1.5 CAL/FIBER,) LIQD Give 1 can Osmolite 1.2 + 1 can of Jevity 1.5 QID via PEG with 60 cc free water before and after bolus. Flush with 240 cc free water BID between feedings. 948 mL   . omeprazole (PRILOSEC) 40 MG capsule Take 1 capsule (40 mg total) by mouth daily. 90 capsule 3  . ondansetron (ZOFRAN) 8 MG tablet TAKE 1 TABLET BY MOUTH EVERY 8 HOURS AS NEEDED FOR NAUSEA 60 tablet 3  . scopolamine (TRANSDERM-SCOP, 1.5 MG,) 1 MG/3DAYS Place 1 patch (1.5 mg total) onto the skin every 3 (three) days. 10 patch 12  . sodium chloride irrigation 0.9 % irrigation USE TO CLEAN AROUND TRACH AND PERFORM TRACH CARE ONCE DAILY AND AS NEEDED 1000 mL 11  . triamcinolone (NASACORT AQ) 55 MCG/ACT AERO nasal inhaler Place 2 sprays into the nose daily. 1 Inhaler 12   Current Facility-Administered Medications  Medication Dose Route Frequency Provider Last Rate Last Dose  . guaiFENesin-dextromethorphan (ROBITUSSIN DM) 100-10 MG/5ML syrup 10 mL  10 mL Oral Q4H PRN Alvy Bimler, Kyle Stansell, MD   10 mL at 01/09/17 1427   Facility-Administered Medications Ordered in Other Visits  Medication Dose Route Frequency Provider Last Rate Last Dose  . 0.9 %  sodium chloride infusion   Intravenous Once Angelene Rome, MD      . alteplase (CATHFLO ACTIVASE) injection 2 mg  2 mg Intracatheter Once PRN Alvy Bimler, Fidelia Cathers, MD      . heparin lock flush 100 unit/mL  500 Units Intracatheter Once PRN Alvy Bimler, Jazz Biddy, MD      . heparin lock flush 100 unit/mL  250 Units Intracatheter Once PRN Alvy Bimler, Jisella Ashenfelter, MD      . HYDROmorphone (DILAUDID) injection 2 mg  2 mg Intravenous Q2H PRN Alvy Bimler, Yoshi Vicencio, MD   2 mg at 12/26/16 1440  . ondansetron (ZOFRAN) injection 8 mg  8 mg Intravenous Once Drue Second R, NP      . sodium chloride 0.9 % injection 10 mL  10 mL Intracatheter PRN Alvy Bimler, Chanda Laperle, MD   10 mL at 01/02/17 1107  . sodium chloride 0.9 % injection 10 mL  10 mL Intracatheter PRN Aniko Finnigan, MD      . sodium chloride  flush (NS) 0.9 % injection 10 mL  10 mL Intracatheter PRN Alvy Bimler, Amberrose Friebel, MD   10 mL at 12/26/16 1708  . sodium chloride flush (NS) 0.9 % injection 10 mL  10 mL Intracatheter PRN [name], MD  10 mL at 07/24/17 1704    PHYSICAL EXAMINATION: ECOG PERFORMANCE STATUS: 1 - Symptomatic but completely ambulatory  Vitals:   10/16/17 1219  BP: 138/86  Pulse: 92  Resp: (!) 24  Temp: 98.6 F (37 C)  SpO2: 100%   Filed Weights   10/16/17 1219  Weight: 169 lb 3.2 oz (76.7 kg)    GENERAL:alert, no distress and comfortable SKIN: skin color, texture, turgor are normal, no rashes or significant lesions EYES: normal, Conjunctiva are pink and non-injected, sclera clear OROPHARYNX:no exudate, no erythema and lips, buccal mucosa, and tongue normal  NECK: Tracheostomy in situ.  No signs of bleeding or infection LYMPH:  no palpable lymphadenopathy in the cervical, axillary or inguinal LUNGS: clear to auscultation and percussion with normal breathing effort HEART: regular rate & rhythm and no murmurs and no lower extremity edema ABDOMEN:abdomen soft, non-tender and normal bowel sounds Musculoskeletal:no cyanosis of digits and no clubbing  NEURO: alert & oriented x 3 with fluent speech, no focal motor/sensory deficits  LABORATORY DATA:  I have reviewed the data as listed    Component Value Date/Time   NA 140 10/16/2017 1151   K 3.7 10/16/2017 1151   CL 100 06/28/2017 1642   CO2 28 10/16/2017 1151   GLUCOSE 121 10/16/2017 1151   BUN 14.7 10/16/2017 1151   CREATININE 0.8 10/16/2017 1151   CALCIUM 9.7 10/16/2017 1151   PROT 7.2 10/16/2017 1151   ALBUMIN 3.8 10/16/2017 1151   AST 13 10/16/2017 1151   ALT 10 10/16/2017 1151   ALKPHOS 62 10/16/2017 1151   BILITOT 0.41 10/16/2017 1151   GFRNONAA 98 06/28/2017 1642   GFRAA 114 06/28/2017 1642    No results found for: SPEP, UPEP  Lab Results  Component Value Date   WBC 10.0 10/16/2017   NEUTROABS 8.3 (H) 10/16/2017   HGB 13.2 10/16/2017    HCT 39.2 10/16/2017   MCV 96.0 10/16/2017   PLT 222 10/16/2017      Chemistry      Component Value Date/Time   NA 140 10/16/2017 1151   K 3.7 10/16/2017 1151   CL 100 06/28/2017 1642   CO2 28 10/16/2017 1151   BUN 14.7 10/16/2017 1151   CREATININE 0.8 10/16/2017 1151      Component Value Date/Time   CALCIUM 9.7 10/16/2017 1151   ALKPHOS 62 10/16/2017 1151   AST 13 10/16/2017 1151   ALT 10 10/16/2017 1151   BILITOT 0.41 10/16/2017 1151      ASSESSMENT & PLAN:  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[place]) His recent CT in September showed stable disease He is experiencing a lot of pain and tightness around his neck The patient wants to proceed with palliative chemotherapy today I recommend he consult with ENT service for further discussion about discomfort related to tracheostomy placement We will continue treatment every other week and I plan to repeat imaging study at the end of the year  Cancer associated pain He has severe pain related to tracheostomy placement I increase his pain medicine substantially last week and he is better controlled now Continue the same medication without dose adjustment  Tracheostomy care [facility]) He has significant complain about tightness and discomfort Clinically, there is no signs of infection of bleeding to tracheostomy.  I recommend ENT consult for evaluation and management. He has number to call   No orders of the defined types were placed in this encounter.  All questions were answered. The patient knows to call the clinic with any  problems, questions or concerns. No barriers to learning was detected. I spent 15 minutes counseling the patient face to face. The total time spent in the appointment was 20 minutes and more than 50% was on counseling and review of test results     [name], MD 10/16/2017 1:54 PM

## 2017-10-16 NOTE — Assessment & Plan Note (Signed)
He has severe pain related to tracheostomy placement I increase his pain medicine substantially last week and he is better controlled now Continue the same medication without dose adjustment

## 2017-10-16 NOTE — Telephone Encounter (Signed)
Scheduled appt per 11/5 los - patient to get updated schedule in the treatment area.

## 2017-10-26 ENCOUNTER — Other Ambulatory Visit (HOSPITAL_COMMUNITY): Payer: Self-pay | Admitting: Interventional Radiology

## 2017-10-26 DIAGNOSIS — R633 Feeding difficulties, unspecified: Secondary | ICD-10-CM

## 2017-10-30 ENCOUNTER — Other Ambulatory Visit: Payer: Self-pay | Admitting: Hematology and Oncology

## 2017-10-30 ENCOUNTER — Ambulatory Visit (HOSPITAL_BASED_OUTPATIENT_CLINIC_OR_DEPARTMENT_OTHER): Payer: Medicaid Other

## 2017-10-30 ENCOUNTER — Other Ambulatory Visit (HOSPITAL_BASED_OUTPATIENT_CLINIC_OR_DEPARTMENT_OTHER): Payer: Medicaid Other

## 2017-10-30 ENCOUNTER — Telehealth: Payer: Self-pay | Admitting: *Deleted

## 2017-10-30 ENCOUNTER — Ambulatory Visit: Payer: Medicaid Other | Admitting: Nutrition

## 2017-10-30 ENCOUNTER — Encounter (HOSPITAL_COMMUNITY): Payer: Self-pay | Admitting: Diagnostic Radiology

## 2017-10-30 ENCOUNTER — Ambulatory Visit (HOSPITAL_COMMUNITY)
Admission: RE | Admit: 2017-10-30 | Discharge: 2017-10-30 | Disposition: A | Discharge status: Home / Self Care | Payer: Medicaid Other | Source: Ambulatory Visit | Attending: Interventional Radiology | Admitting: Interventional Radiology

## 2017-10-30 DIAGNOSIS — Z5111 Encounter for antineoplastic chemotherapy: Secondary | ICD-10-CM | POA: Diagnosis present

## 2017-10-30 DIAGNOSIS — C321 Malignant neoplasm of supraglottis: Secondary | ICD-10-CM

## 2017-10-30 DIAGNOSIS — Y733 Surgical instruments, materials and gastroenterology and urology devices (including sutures) associated with adverse incidents: Secondary | ICD-10-CM | POA: Diagnosis not present

## 2017-10-30 DIAGNOSIS — K9423 Gastrostomy malfunction: Secondary | ICD-10-CM | POA: Diagnosis present

## 2017-10-30 DIAGNOSIS — R633 Feeding difficulties, unspecified: Secondary | ICD-10-CM

## 2017-10-30 DIAGNOSIS — G893 Neoplasm related pain (acute) (chronic): Secondary | ICD-10-CM

## 2017-10-30 HISTORY — PX: IR REPLC GASTRO/COLONIC TUBE PERCUT W/FLUORO: IMG2333

## 2017-10-30 LAB — COMPREHENSIVE METABOLIC PANEL
ALT: 11 U/L (ref 0–55)
AST: 12 U/L (ref 5–34)
Albumin: 3.8 g/dL (ref 3.5–5.0)
Alkaline Phosphatase: 60 U/L (ref 40–150)
Anion Gap: 8 mEq/L (ref 3–11)
BUN: 13.9 mg/dL (ref 7.0–26.0)
CHLORIDE: 103 meq/L (ref 98–109)
CO2: 28 meq/L (ref 22–29)
Calcium: 9.4 mg/dL (ref 8.4–10.4)
Creatinine: 0.7 mg/dL (ref 0.7–1.3)
GLUCOSE: 100 mg/dL (ref 70–140)
POTASSIUM: 3.9 meq/L (ref 3.5–5.1)
SODIUM: 138 meq/L (ref 136–145)
TOTAL PROTEIN: 7.1 g/dL (ref 6.4–8.3)
Total Bilirubin: 0.41 mg/dL (ref 0.20–1.20)

## 2017-10-30 LAB — CBC WITH DIFFERENTIAL/PLATELET
BASO%: 0.3 % (ref 0.0–2.0)
BASOS ABS: 0 10*3/uL (ref 0.0–0.1)
EOS%: 1 % (ref 0.0–7.0)
Eosinophils Absolute: 0.1 10*3/uL (ref 0.0–0.5)
HEMATOCRIT: 36.7 % — AB (ref 38.4–49.9)
HEMOGLOBIN: 12.6 g/dL — AB (ref 13.0–17.1)
LYMPH#: 0.5 10*3/uL — AB (ref 0.9–3.3)
LYMPH%: 5.2 % — ABNORMAL LOW (ref 14.0–49.0)
MCH: 33.1 pg (ref 27.2–33.4)
MCHC: 34.3 g/dL (ref 32.0–36.0)
MCV: 96.5 fL (ref 79.3–98.0)
MONO#: 0.9 10*3/uL (ref 0.1–0.9)
MONO%: 8.9 % (ref 0.0–14.0)
NEUT%: 84.6 % — ABNORMAL HIGH (ref 39.0–75.0)
NEUTROS ABS: 8.3 10*3/uL — AB (ref 1.5–6.5)
Platelets: 219 10*3/uL (ref 140–400)
RBC: 3.81 10*6/uL — ABNORMAL LOW (ref 4.20–5.82)
RDW: 15.8 % — AB (ref 11.0–14.6)
WBC: 9.8 10*3/uL (ref 4.0–10.3)

## 2017-10-30 MED ORDER — DIPHENHYDRAMINE HCL 50 MG/ML IJ SOLN
INTRAMUSCULAR | Status: AC
  Filled 2017-10-30: qty 1

## 2017-10-30 MED ORDER — SODIUM CHLORIDE 0.9 % IV SOLN
Freq: Once | INTRAVENOUS | Status: AC
  Administered 2017-10-30: 10:00:00 via INTRAVENOUS

## 2017-10-30 MED ORDER — SODIUM CHLORIDE 0.9% FLUSH
10.0000 mL | INTRAVENOUS | Status: DC | PRN
  Administered 2017-10-30: 10 mL
  Filled 2017-10-30: qty 10

## 2017-10-30 MED ORDER — MORPHINE SULFATE (CONCENTRATE) 20 MG/ML PO SOLN: 20.0000 mg | ORAL | 0 refills | Status: DC | PRN

## 2017-10-30 MED ORDER — FAMOTIDINE IN NACL 20-0.9 MG/50ML-% IV SOLN
INTRAVENOUS | Status: AC
  Filled 2017-10-30: qty 50

## 2017-10-30 MED ORDER — HYDROMORPHONE HCL 4 MG/ML IJ SOLN
2.0000 mg | Freq: Once | INTRAMUSCULAR | Status: AC
Start: 2017-10-30 — End: 2017-10-30
  Administered 2017-10-30: 2 mg via INTRAVENOUS
  Filled 2017-10-30: qty 1

## 2017-10-30 MED ORDER — PALONOSETRON HCL INJECTION 0.25 MG/5ML
0.2500 mg | Freq: Once | INTRAVENOUS | Status: AC
  Administered 2017-10-30: 0.25 mg via INTRAVENOUS

## 2017-10-30 MED ORDER — SODIUM CHLORIDE 0.9 % IV SOLN
Freq: Once | INTRAVENOUS | Status: AC
  Administered 2017-10-30: 12:00:00 via INTRAVENOUS
  Filled 2017-10-30: qty 5

## 2017-10-30 MED ORDER — SODIUM CHLORIDE 0.9 % IV SOLN
80.0000 mg/m2 | Freq: Once | INTRAVENOUS | Status: AC
  Administered 2017-10-30: 156 mg via INTRAVENOUS
  Filled 2017-10-30: qty 26

## 2017-10-30 MED ORDER — IOPAMIDOL (ISOVUE-300) INJECTION 61%
50.0000 mL | Freq: Once | INTRAVENOUS | Status: AC | PRN
  Administered 2017-10-30: 15 mL

## 2017-10-30 MED ORDER — IOPAMIDOL (ISOVUE-300) INJECTION 61%
INTRAVENOUS | Status: AC
  Administered 2017-10-30: 15 mL
  Filled 2017-10-30: qty 50

## 2017-10-30 MED ORDER — DIPHENHYDRAMINE HCL 50 MG/ML IJ SOLN
50.0000 mg | Freq: Once | INTRAMUSCULAR | Status: AC
Start: 2017-10-30 — End: 2017-10-30
  Administered 2017-10-30: 50 mg via INTRAVENOUS

## 2017-10-30 MED ORDER — HYDROMORPHONE HCL 2 MG/ML IJ SOLN
INTRAMUSCULAR | Status: AC
  Filled 2017-10-30: qty 1

## 2017-10-30 MED ORDER — PALONOSETRON HCL INJECTION 0.25 MG/5ML
INTRAVENOUS | Status: AC
  Filled 2017-10-30: qty 5

## 2017-10-30 MED ORDER — FENTANYL 25 MCG/HR TD PT72: 25.0000 ug | MEDICATED_PATCH | TRANSDERMAL | 0 refills | Status: DC

## 2017-10-30 MED ORDER — FAMOTIDINE IN NACL 20-0.9 MG/50ML-% IV SOLN
20.0000 mg | Freq: Once | INTRAVENOUS | Status: AC
  Administered 2017-10-30: 20 mg via INTRAVENOUS

## 2017-10-30 MED ORDER — GABAPENTIN 300 MG PO CAPS
300.0000 mg | ORAL_CAPSULE | Freq: Three times a day (TID) | ORAL | 11 refills | Status: AC
Start: 2017-10-30 — End: ?

## 2017-10-30 MED ORDER — HEPARIN SOD (PORK) LOCK FLUSH 100 UNIT/ML IV SOLN
500.0000 [IU] | Freq: Once | INTRAVENOUS | Status: AC | PRN
  Administered 2017-10-30: 500 [IU]
  Filled 2017-10-30: qty 5

## 2017-10-30 MED FILL — GABAPENTIN 300 MG CAPSULE: 300 | 30 days supply | Qty: 90 | Fill #0

## 2017-10-30 MED FILL — fentaNYL 25 MCG/HR PT72: 25 | 15 days supply | Qty: 5 | Fill #0

## 2017-10-30 MED FILL — ONDANSETRON HCL 8 MG TAB: 8 | 20 days supply | Qty: 60 | Fill #1

## 2017-10-30 MED FILL — MORPHINE SULF 100 MG/5 ML S: 100 | 20 days supply | Qty: 240 | Fill #0

## 2017-10-30 NOTE — Telephone Encounter (Signed)
New prescriptions given to patient in the infusion room

## 2017-10-30 NOTE — Patient Instructions (Signed)
[facility] Discharge Instructions for Patients Receiving Chemotherapy  Today you received the following chemotherapy agents :  Taxol.  To help prevent nausea and vomiting after your treatment, we encourage you to take your nausea medication as prescribed.   If you develop nausea and vomiting that is not controlled by your nausea medication, call the clinic.   BELOW ARE SYMPTOMS THAT SHOULD BE REPORTED IMMEDIATELY:  *FEVER GREATER THAN 100.5 F  *CHILLS WITH OR WITHOUT FEVER  NAUSEA AND VOMITING THAT IS NOT CONTROLLED WITH YOUR NAUSEA MEDICATION  *UNUSUAL SHORTNESS OF BREATH  *UNUSUAL BRUISING OR BLEEDING  TENDERNESS IN MOUTH AND THROAT WITH OR WITHOUT PRESENCE OF ULCERS  *URINARY PROBLEMS  *BOWEL PROBLEMS  UNUSUAL RASH Items with * indicate a potential emergency and should be followed up as soon as possible.  Feel free to call the clinic should you have any questions or concerns. The clinic phone number is [phone].  Please show the CHEMO ALERT CARD at check-in to the Emergency Department and triage nurse.   

## 2017-10-30 NOTE — Telephone Encounter (Signed)
Mr Jungwirth reports for the last 11 days he has been having severe pain from hips to feet when he tries to lie down.   Is concerned that his medicines are not getting down- when he swallows, liquids come out of his nose. Is trying to get in sooner than 11/28 with Dr Warren Danes.   Is wondering about liquid pain medicine he can put through tube.

## 2017-10-30 NOTE — Progress Notes (Signed)
Nutrition follow-up completed with patient during infusion for supraglottis cancer. Current weight 169 pounds relatively stable from 172.9 pounds October 8. Patient continues to tolerate tube feedings using Osmolite 1.5 and Jevity 1.5. He has no nutrition impact symptoms.  Nutrition diagnosis: Unintentional weight loss stable.  Intervention: Continue with current tube feeding regimen at this time.  Monitoring, evaluation, goals: Patient will continue to tolerate tube feedings for weight maintenance/weight gain.  Next visit: As needed with infusions.  **Disclaimer: This note was dictated with voice recognition software. Similar sounding words can inadvertently be transcribed and this note may contain transcription errors which may not have been corrected upon publication of note.**

## 2017-10-31 MED FILL — LEVOTHYROXINE 100 MCG TAB: 100 | 30 days supply | Qty: 30 | Fill #8

## 2017-11-07 ENCOUNTER — Telehealth: Payer: Self-pay | Admitting: Nutrition

## 2017-11-07 NOTE — Telephone Encounter (Signed)
I received a phone call from Dr. [name].  Patient weight documented as 163 pounds this morning when he saw physician. Last weight documented was 169 pounds November 19.  M.D. reports patient's tube feeding gives patient diarrhea after every feeding. I contacted patient by phone who reports this has been happening since right before Thanksgiving. Patient did not want to bother anyone so he did not notify us. Patient endorses very loose stools after tube feeding.  He has been using one can Jevity 1.5 and one can Osmolite 1.2, feeding 3 times a day. Patient is not eating any food by mouth. Patient does not want to come in for nutrition follow-up but agrees to speak with me frequently on the phone.  I recommended patient begin 1 bottle Osmolite 1.2 with free water flushes of 60 mL before and after feeding.  He is to hold Jevity 1.5 for now.  I will contact him this afternoon by phone to follow-up on feeding tolerance.  Patient is agreeable to plan.  **Disclaimer: This note was dictated with voice recognition software. Similar sounding words can inadvertently be transcribed and this note may contain transcription errors which may not have been corrected upon publication of note.**

## 2017-11-08 ENCOUNTER — Telehealth: Payer: Self-pay | Admitting: Nutrition

## 2017-11-08 NOTE — Telephone Encounter (Signed)
I contacted patient by telephone to follow-up on tube feeding tolerance.  Patient reports he gave one bottle Osmolite 1.2 yesterday and again last night and reports diarrhea continues after each feeding. He reports his stool as being watery. He states he is using one Imodium after each feeding without much change in stool.  He then reports that after his feeding tube was changed out about 6 or 8 months ago, this same type of diarrhea occurred after each feeding.  He states that this resolved after about 5 or 6 days and then reports he thinks everything is getting better.  I encouraged patient to continue Osmolite 1.2 one bottle given at room temperature over 10-15 minutes with free water before and after bolus feeding.  I encouraged patient to continue Imodium per M.D. I will follow-up by phone again before the weekend.  **Disclaimer: This note was dictated with voice recognition software. Similar sounding words can inadvertently be transcribed and this note may contain transcription errors which may not have been corrected upon publication of note.**

## 2017-11-09 ENCOUNTER — Other Ambulatory Visit: Payer: Self-pay | Admitting: Otolaryngology

## 2017-11-09 DIAGNOSIS — R1312 Dysphagia, oropharyngeal phase: Secondary | ICD-10-CM

## 2017-11-09 DIAGNOSIS — Z8521 Personal history of malignant neoplasm of larynx: Secondary | ICD-10-CM

## 2017-11-10 ENCOUNTER — Ambulatory Visit: Payer: Medicaid Other | Admitting: Nutrition

## 2017-11-10 NOTE — Progress Notes (Signed)
I contacted patient by telephone to follow-up on tube feeding tolerance. Patient reports diarrhea has resolved. States he is back to normal. States he is tolerating 6 bottles of tube feeding daily. Patient educated to continue 3 bottles Osmolite 1.2, and 3 bottles Jevity 1.5 with free water before and after bolus feeding. Patient appreciative of call. Follow-up scheduled for Monday, December 3, during infusion.  **Disclaimer: This note was dictated with voice recognition software. Similar sounding words can inadvertently be transcribed and this note may contain transcription errors which may not have been corrected upon publication of note.**

## 2017-11-13 ENCOUNTER — Telehealth: Payer: Self-pay | Admitting: Hematology and Oncology

## 2017-11-13 ENCOUNTER — Ambulatory Visit: Payer: Medicaid Other

## 2017-11-13 ENCOUNTER — Ambulatory Visit (HOSPITAL_BASED_OUTPATIENT_CLINIC_OR_DEPARTMENT_OTHER): Payer: Medicaid Other | Admitting: Hematology and Oncology

## 2017-11-13 ENCOUNTER — Encounter: Payer: Self-pay | Admitting: Hematology and Oncology

## 2017-11-13 ENCOUNTER — Other Ambulatory Visit (HOSPITAL_BASED_OUTPATIENT_CLINIC_OR_DEPARTMENT_OTHER): Payer: Medicaid Other

## 2017-11-13 ENCOUNTER — Other Ambulatory Visit: Payer: Self-pay | Admitting: *Deleted

## 2017-11-13 ENCOUNTER — Ambulatory Visit: Payer: Medicaid Other | Admitting: Nutrition

## 2017-11-13 ENCOUNTER — Ambulatory Visit (HOSPITAL_BASED_OUTPATIENT_CLINIC_OR_DEPARTMENT_OTHER): Payer: Medicaid Other

## 2017-11-13 ENCOUNTER — Telehealth: Payer: Self-pay

## 2017-11-13 DIAGNOSIS — G893 Neoplasm related pain (acute) (chronic): Secondary | ICD-10-CM | POA: Diagnosis not present

## 2017-11-13 DIAGNOSIS — R197 Diarrhea, unspecified: Secondary | ICD-10-CM | POA: Diagnosis not present

## 2017-11-13 DIAGNOSIS — C321 Malignant neoplasm of supraglottis: Secondary | ICD-10-CM

## 2017-11-13 DIAGNOSIS — E039 Hypothyroidism, unspecified: Secondary | ICD-10-CM

## 2017-11-13 DIAGNOSIS — Z5111 Encounter for antineoplastic chemotherapy: Secondary | ICD-10-CM | POA: Diagnosis not present

## 2017-11-13 LAB — TSH: TSH: 0.544 m[IU]/L (ref 0.320–4.118)

## 2017-11-13 LAB — CBC WITH DIFFERENTIAL/PLATELET
BASO%: 0.4 % (ref 0.0–2.0)
Basophils Absolute: 0 10*3/uL (ref 0.0–0.1)
EOS%: 0.3 % (ref 0.0–7.0)
Eosinophils Absolute: 0 10*3/uL (ref 0.0–0.5)
HEMATOCRIT: 37.3 % — AB (ref 38.4–49.9)
HEMOGLOBIN: 12.3 g/dL — AB (ref 13.0–17.1)
LYMPH#: 0.4 10*3/uL — AB (ref 0.9–3.3)
LYMPH%: 3.8 % — ABNORMAL LOW (ref 14.0–49.0)
MCH: 32 pg (ref 27.2–33.4)
MCHC: 33.1 g/dL (ref 32.0–36.0)
MCV: 96.6 fL (ref 79.3–98.0)
MONO#: 0.7 10*3/uL (ref 0.1–0.9)
MONO%: 6.5 % (ref 0.0–14.0)
NEUT#: 10.1 10*3/uL — ABNORMAL HIGH (ref 1.5–6.5)
NEUT%: 89 % — ABNORMAL HIGH (ref 39.0–75.0)
Platelets: 279 10*3/uL (ref 140–400)
RBC: 3.86 10*6/uL — ABNORMAL LOW (ref 4.20–5.82)
RDW: 15.6 % — AB (ref 11.0–14.6)
WBC: 11.3 10*3/uL — ABNORMAL HIGH (ref 4.0–10.3)

## 2017-11-13 LAB — COMPREHENSIVE METABOLIC PANEL
ALBUMIN: 3.6 g/dL (ref 3.5–5.0)
ALK PHOS: 68 U/L (ref 40–150)
ALT: 11 U/L (ref 0–55)
AST: 14 U/L (ref 5–34)
Anion Gap: 8 mEq/L (ref 3–11)
BILIRUBIN TOTAL: 0.27 mg/dL (ref 0.20–1.20)
BUN: 12.4 mg/dL (ref 7.0–26.0)
CHLORIDE: 101 meq/L (ref 98–109)
CO2: 30 mEq/L — ABNORMAL HIGH (ref 22–29)
Calcium: 9.5 mg/dL (ref 8.4–10.4)
Creatinine: 0.8 mg/dL (ref 0.7–1.3)
EGFR: >60 mL/min/{1.73_m2} (ref >60)
GLUCOSE: 136 mg/dL (ref 70–140)
POTASSIUM: 3.5 meq/L (ref 3.5–5.1)
SODIUM: 138 meq/L (ref 136–145)
Total Protein: 7.1 g/dL (ref 6.4–8.3)

## 2017-11-13 MED ORDER — HEPARIN SOD (PORK) LOCK FLUSH 100 UNIT/ML IV SOLN
500.0000 [IU] | Freq: Once | INTRAVENOUS | Status: AC | PRN
  Administered 2017-11-13: 500 [IU]
  Filled 2017-11-13: qty 5

## 2017-11-13 MED ORDER — SODIUM CHLORIDE 0.9% FLUSH
10.0000 mL | Freq: Once | INTRAVENOUS | Status: AC
  Administered 2017-11-13: 10 mL
  Filled 2017-11-13: qty 10

## 2017-11-13 MED ORDER — FAMOTIDINE IN NACL 20-0.9 MG/50ML-% IV SOLN
INTRAVENOUS | Status: AC
  Filled 2017-11-13: qty 50

## 2017-11-13 MED ORDER — HYDROMORPHONE HCL 2 MG/ML IJ SOLN
INTRAMUSCULAR | Status: AC
  Filled 2017-11-13: qty 1

## 2017-11-13 MED ORDER — SODIUM CHLORIDE 0.9 % IV SOLN
Freq: Once | INTRAVENOUS | Status: AC
  Administered 2017-11-13: 15:00:00 via INTRAVENOUS

## 2017-11-13 MED ORDER — PALONOSETRON HCL INJECTION 0.25 MG/5ML
0.2500 mg | Freq: Once | INTRAVENOUS | Status: AC
  Administered 2017-11-13: 0.25 mg via INTRAVENOUS

## 2017-11-13 MED ORDER — PALONOSETRON HCL INJECTION 0.25 MG/5ML
INTRAVENOUS | Status: AC
Start: 2017-11-13 — End: 2017-11-13
  Filled 2017-11-13: qty 5

## 2017-11-13 MED ORDER — SODIUM CHLORIDE 0.9 % IV SOLN
80.0000 mg/m2 | Freq: Once | INTRAVENOUS | Status: AC
  Administered 2017-11-13: 156 mg via INTRAVENOUS
  Filled 2017-11-13: qty 26

## 2017-11-13 MED ORDER — HYDROMORPHONE HCL 2 MG/ML IJ SOLN
2.0000 mg | Freq: Once | INTRAMUSCULAR | Status: AC | PRN
  Administered 2017-11-13: 2 mg via INTRAVENOUS

## 2017-11-13 MED ORDER — DIPHENHYDRAMINE HCL 50 MG/ML IJ SOLN
50.0000 mg | Freq: Once | INTRAMUSCULAR | Status: AC
  Administered 2017-11-13: 50 mg via INTRAVENOUS

## 2017-11-13 MED ORDER — SODIUM CHLORIDE 0.9 % IV SOLN
Freq: Once | INTRAVENOUS | Status: AC
  Administered 2017-11-13: 16:00:00 via INTRAVENOUS
  Filled 2017-11-13: qty 5

## 2017-11-13 MED ORDER — HYDROMORPHONE HCL 4 MG/ML IJ SOLN
2.0000 mg | Freq: Once | INTRAMUSCULAR | Status: DC
  Filled 2017-11-13: qty 1

## 2017-11-13 MED ORDER — SODIUM CHLORIDE 0.9% FLUSH
10.0000 mL | INTRAVENOUS | Status: DC | PRN
  Administered 2017-11-13: 10 mL
  Filled 2017-11-13: qty 10

## 2017-11-13 MED ORDER — DIPHENHYDRAMINE HCL 50 MG/ML IJ SOLN
INTRAMUSCULAR | Status: AC
  Filled 2017-11-13: qty 1

## 2017-11-13 MED ORDER — FAMOTIDINE IN NACL 20-0.9 MG/50ML-% IV SOLN
20.0000 mg | Freq: Once | INTRAVENOUS | Status: AC
  Administered 2017-11-13: 20 mg via INTRAVENOUS

## 2017-11-13 MED FILL — TRANSDERM-SCOP 1.5 MG/72HR: 1 | 30 days supply | Qty: 10 | Fill #2

## 2017-11-13 NOTE — Telephone Encounter (Signed)
Scheduled appt per 1/23 los - Gave patient AVS and calender per los.  

## 2017-11-13 NOTE — Progress Notes (Signed)
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[place])   03/15/2015 - 03/17/2015 Hospital Admission    He was admitted to the hospital for evaluation of dysphagia, SOB, hemoptosis, hoarseness, 30-40 pound weight loss and worsening bilateral neck masses for 5 months      03/15/2015 Imaging    Ct showed extensive circumferential malignancy in the hypopharyngeal/supraglottic region with regional LN metastases      03/16/2015 Procedure    He underwent ULTRASOUND-GUIDED BIOPSY OF LEFT CERVICAL LYMPH NODES      03/16/2015 Pathology Results    Accession: [id number] LN biopsy showed invasive squamous cell cancer      03/16/2015 Pathology Results    Accession: [id number] showed atypical squamous cells      03/25/2015 - 04/07/2015 Hospital Admission    He was admitted to the hospital and underwent tracheostomy placement, feeeding tube placement but subsequently left [facility]      03/26/2015 Surgery    He had multiple extraction of tooth numbers 1, 2, 5, 6, 7, 8, 9, 10, 11, 12, 13, 18, 19, 21, 22, 23, 24, 25, 26, 27, 28, and 29. and 4 Quadrants of alveoloplasty      03/26/2015 Surgery    He underwent tracheostomy      03/31/2015 Surgery    He had open gastrostomy tube placement by Dr. Donne Hazel      04/16/2015 - 05/18/2015 Radiation Therapy    Laryngopharynx and bilateral neck / 50 Gy in 20 fractions to gross disease, 45 Gy in 20 fractions to high risk nodal echelons  Beams/energy: Helical IMRT / 6 MV photons      04/16/2015 Procedure    Fluoroscopic reposition of the 18 French gastrostomy confirmed back in the stomach,       07/03/2015 Procedure    IR performed replacement of gastrostomy tube with a new 68 French balloon retention tube      10/01/2015 Imaging    PEt scan showed persistent hypermetabolism within the primary supraglottic laryngeal tumor and within right retropharyngeal, bilateral level II and left level IV cervical nodal metastases      10/28/2015 Procedure    He had placement of PICC line. The IR was not able to place PORT due to suspected upper respiratory infection      11/13/2015 - 03/21/2016 Chemotherapy    He received 5FU, carboplatin chemo with weekly Erbitux      11/19/2015 Surgery    Gastrostomy tube replaced.      11/30/2015 Procedure    Placement of right jugular port-a-cath.      11/30/2015 Procedure    PICC removed.      12/30/2015 Imaging    PET CT showed positive response to Rx      02/26/2016 Procedure    He underwent direct laryngoscopy with biopsy. Esophageal dilatation.       02/26/2016 Pathology Results    Repeat biopsy of supraglottis showed persistent disease      02/29/2016 Procedure    Gastrostomy tube exchanged.      03/28/2016 PET scan    Hypermetabolic tissue in the posterior RIGHT hypopharynx is similar in pattern to PET-CT of 12/30/2015 but increased in metabolic activity.2. Hypermetabolic  tissue / lymph nodes in the LEFT supraclavicular nodal station are in a similar pattern       04/04/2016 - 11/29/2016 Chemotherapy    He started on palliative chemotherapy with pembrolizumab      05/03/2016 Imaging    MRI head is negative      06/02/2016 Imaging    Mild improvement of diffuse pharyngeal and supraglottic edema.Increased asymmetry of right-sided hypopharyngeal/supraglottic softtissue compared to the prior neck CT with FDG uptake in this region on prior PET-CT. Residual/recurrent tumor is possible      08/09/2016 Procedure    IR placed new 20 French percutaneous gastrostomy tube.      08/26/2016 Imaging    Ct neck showed unchanged diffuse  pharyngeal and supraglottic edema as well as asymmetric soft tissue thickening on the right. Slightly decreased size of some left level II lymph nodes. Unchanged lymphadenopathy elsewhere in the neck. Decreased size of thyroid mass. No evidence of metastatic cancer to the chest      12/13/2016 Imaging    Ct chest showed no evidence of thoracic metastatic disease or primary thoracic malignancy.      12/13/2016 Imaging    Ct neck showed progression of of RIGHT supraglottic mass compared with priors, approximate size 24 x 27 x 27 mm. Extension caudally along the RIGHT area epiglottic fold with increasing mass effect on the airway. Tracheostomy satisfactory position. Stable to slightly improved malignant adenopathy.      12/26/2016 -  Chemotherapy    He received chemotherapy with weekly Taxol       02/10/2017 Imaging    Very similar appearance to the study of January. Right sided supraglottic to glottic mass is similar, perhaps a few mm smaller. Bilateral enlarged nodes appear the same. No progressive finding      02/10/2017 Imaging    No evidence of metastatic disease in the abdomen/pelvis. Percutaneous gastrostomy in satisfactory position. Cholelithiasis, without associated inflammatory changes.      06/06/2017 Imaging    CT neck: Supraglottic tumor on the right is mildly smaller now measuring 23 x 16 mm. Improvement in bilateral cervical adenopathy. No new adenopathy identified      09/06/2017 Imaging    1. Essentially stable post treatment CT appearance of the neck since June (see #3). 2. Residual right supraglottic soft tissue asymmetry and indistinct 16 mm right level II nodal tissue is superimposed on post treatment changes including diffuse pharyngeal mucosal space soft tissue thickening. 3. A 15 mm round low-density mass in the left thyroid region is slowly enlarging and favored to be a thyroid nodule rather than on necrotic lymph node. Attention on follow-up Neck CT versus dedicated Thyroid  Ultrasound recommended.       INTERVAL HISTORY: Please see below for problem oriented charting. He returns for further follow-up I have reviewed documentation by his ENT surgeon last week He continues to have significant pain in his throat He is afraid to take more pain medicine due to occasional urinary retention He had recent diarrhea, resolved He denies peripheral neuropathy  REVIEW OF SYSTEMS:   Constitutional: Denies fevers, chills or abnormal weight loss Eyes: Denies blurriness of vision Ears, nose, mouth, throat, and face: Denies mucositis or sore throat Respiratory: Denies cough, dyspnea or wheezes Cardiovascular: Denies palpitation, chest discomfort or lower extremity swelling Skin: Denies abnormal skin rashes Lymphatics: Denies new lymphadenopathy or easy bruising Neurological:Denies numbness, tingling or new weaknesses Behavioral/Psych: Mood is stable, no new changes  All other systems were reviewed with the patient  and are negative.  I have reviewed the past medical history, past surgical history, social history and family history with the patient and they are unchanged from previous note.  ALLERGIES:  is allergic to aspirin.  MEDICATIONS:  Current Outpatient Medications  Medication Sig Dispense Refill  . amoxicillin (AMOXIL) 250 MG/5ML suspension Place 10 mLs (500 mg total) into feeding tube 2 (two) times daily. For 7 days 140 mL 0  . fentaNYL (DURAGESIC - DOSED MCG/HR) 25 MCG/HR patch Place 1 patch (25 mcg total) every 3 (three) days onto the skin. 5 patch 0  . fluticasone (FLONASE) 50 MCG/ACT nasal spray Place 2 sprays into both nostrils daily. 16 g 2  . gabapentin (NEURONTIN) 300 MG capsule Place 1 capsule (300 mg total) 3 (three) times daily into feeding tube. 90 capsule 11  . levothyroxine (SYNTHROID) 100 MCG tablet Take 1 tablet (100 mcg total) by mouth daily before breakfast. 30 tablet 9  . morphine (MS CONTIN) 100 MG 12 hr tablet Take 1 tablet (100 mg total)  by mouth every 8 (eight) hours. 90 tablet 0  . morphine (MSIR) 30 MG tablet Take 2 tablets (60 mg total) every 4 (four) hours as needed by mouth for severe pain. 90 tablet 0  . morphine (ROXANOL) 20 MG/ML concentrated solution Place 1 mL (20 mg total) every 2 (two) hours as needed into feeding tube for severe pain. 240 mL 0  . Nutritional Supplements (FEEDING SUPPLEMENT, JEVITY 1.5 CAL/FIBER,) LIQD Give 1 can Osmolite 1.2 + 1 can of Jevity 1.5 QID via PEG with 60 cc free water before and after bolus. Flush with 240 cc free water BID between feedings. 948 mL   . omeprazole (PRILOSEC) 40 MG capsule Take 1 capsule (40 mg total) by mouth daily. 90 capsule 3  . ondansetron (ZOFRAN) 8 MG tablet TAKE 1 TABLET BY MOUTH EVERY 8 HOURS AS NEEDED FOR NAUSEA 60 tablet 3  . scopolamine (TRANSDERM-SCOP, 1.5 MG,) 1 MG/3DAYS Place 1 patch (1.5 mg total) onto the skin every 3 (three) days. 10 patch 12  . sodium chloride irrigation 0.9 % irrigation USE TO CLEAN AROUND TRACH AND PERFORM TRACH CARE ONCE DAILY AND AS NEEDED 1000 mL 11  . triamcinolone (NASACORT AQ) 55 MCG/ACT AERO nasal inhaler Place 2 sprays into the nose daily. 1 Inhaler 12   Current Facility-Administered Medications  Medication Dose Route Frequency Provider Last Rate Last Dose  . guaiFENesin-dextromethorphan (ROBITUSSIN DM) 100-10 MG/5ML syrup 10 mL  10 mL Oral Q4H PRN Alvy Bimler, Leslea Vowles, MD   10 mL at 01/09/17 1427   Facility-Administered Medications Ordered in Other Visits  Medication Dose Route Frequency Provider Last Rate Last Dose  . 0.9 %  sodium chloride infusion   Intravenous Once Lennie Dunnigan, MD      . alteplase (CATHFLO ACTIVASE) injection 2 mg  2 mg Intracatheter Once PRN Alvy Bimler, Tranell Wojtkiewicz, MD      . heparin lock flush 100 unit/mL  500 Units Intracatheter Once PRN Alvy Bimler, Vannesa Abair, MD      . heparin lock flush 100 unit/mL  250 Units Intracatheter Once PRN Alvy Bimler, Jovonte Commins, MD      . HYDROmorphone (DILAUDID) injection 2 mg  2 mg Intravenous Q2H PRN Alvy Bimler, Ryli Standlee,  MD   2 mg at 12/26/16 1440  . ondansetron (ZOFRAN) injection 8 mg  8 mg Intravenous Once Drue Second R, NP      . sodium chloride 0.9 % injection 10 mL  10 mL Intracatheter PRN [name], MD  10 mL at 01/02/17 1107  . sodium chloride 0.9 % injection 10 mL  10 mL Intracatheter PRN Ethelwyn Gilbertson, MD      . sodium chloride flush (NS) 0.9 % injection 10 mL  10 mL Intracatheter PRN Alvy Bimler, Yahaira Bruski, MD   10 mL at 12/26/16 1708  . sodium chloride flush (NS) 0.9 % injection 10 mL  10 mL Intracatheter PRN Alvy Bimler, Coila Wardell, MD   10 mL at 07/24/17 1704    PHYSICAL EXAMINATION: ECOG PERFORMANCE STATUS: 1 - Symptomatic but completely ambulatory GENERAL:alert, no distress and comfortable SKIN: skin color, texture, turgor are normal, no rashes or significant lesions EYES: normal, Conjunctiva are pink and non-injected, sclera clear OROPHARYNX:no exudate, no erythema and lips, buccal mucosa, and tongue normal  NECK: Tracheostomy in situ. LYMPH:  no palpable lymphadenopathy in the cervical, axillary or inguinal LUNGS: clear to auscultation and percussion with normal breathing effort HEART: regular rate & rhythm and no murmurs and no lower extremity edema ABDOMEN:abdomen soft, non-tender and normal bowel sounds.  Feeding tube site looks okay Musculoskeletal:no cyanosis of digits and no clubbing  NEURO: alert & oriented x 3 with fluent speech, no focal motor/sensory deficits  LABORATORY DATA:  I have reviewed the data as listed    Component Value Date/Time   NA 138 11/13/2017 1040   K 3.5 11/13/2017 1040   CL 100 06/28/2017 1642   CO2 30 (H) 11/13/2017 1040   GLUCOSE 136 11/13/2017 1040   BUN 12.4 11/13/2017 1040   CREATININE 0.8 11/13/2017 1040   CALCIUM 9.5 11/13/2017 1040   PROT 7.1 11/13/2017 1040   ALBUMIN 3.6 11/13/2017 1040   AST 14 11/13/2017 1040   ALT 11 11/13/2017 1040   ALKPHOS 68 11/13/2017 1040   BILITOT 0.27 11/13/2017 1040   GFRNONAA 98 06/28/2017 1642   GFRAA 114 06/28/2017 1642     No results found for: SPEP, UPEP  Lab Results  Component Value Date   WBC 11.3 (H) 11/13/2017   NEUTROABS 10.1 (H) 11/13/2017   HGB 12.3 (L) 11/13/2017   HCT 37.3 (L) 11/13/2017   MCV 96.6 11/13/2017   PLT 279 11/13/2017      Chemistry      Component Value Date/Time   NA 138 11/13/2017 1040   K 3.5 11/13/2017 1040   CL 100 06/28/2017 1642   CO2 30 (H) 11/13/2017 1040   BUN 12.4 11/13/2017 1040   CREATININE 0.8 11/13/2017 1040      Component Value Date/Time   CALCIUM 9.5 11/13/2017 1040   ALKPHOS 68 11/13/2017 1040   AST 14 11/13/2017 1040   ALT 11 11/13/2017 1040   BILITOT 0.27 11/13/2017 1040       RADIOGRAPHIC STUDIES: I have personally reviewed the radiological images as listed and agreed with the findings in the report. Ir Replc Gastro/colonic Tube Percut W/fluoro  Result Date: 10/30/2017 CLINICAL DATA:  56 year old with a gastrostomy tube. The hub of the tube was broken and needs replacement. EXAM: GASTROSTOMY CATHETER REPLACEMENT Physician: Stephan Minister. Henn, MD FLUOROSCOPY TIME:  6 seconds, 2 mGy MEDICATIONS: None ANESTHESIA/SEDATION: None PROCEDURE: The retention balloon was deflated and the old tube was removed. A new 20 French gastrostomy tube was placed without difficulty. Retention balloon was inflated with 7 mL of saline. Contrast was injected to confirm placement in the stomach. Fluoroscopic images were taken and saved for this procedure. FINDINGS: Gastrostomy tube is well positioned in the stomach. COMPLICATIONS: None IMPRESSION: Replacement of the gastrostomy tube with fluoroscopy. Electronically Signed  By: Markus Daft M.D.   On: 10/30/2017 15:17    ASSESSMENT & PLAN:  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HCC) The patient is miserable with chronic pain He was evaluated by ENT last week who felt that his cancer may have recurred CT scan has been ordered and scheduled next week I would keep an eye on the test results If CT confirms cancer progression,  I will see him back the day after to review plan of care If CT scan show no evidence of disease progression, we will continue Taxol every 2 weeks for now  Acquired hypothyroidism The patient had recent diarrhea I will order TSH to exclude hyperthyroidism as a cause of his diarrhea  Cancer associated pain He has been prescribed multiple different pain medicine Continue the same   Orders Placed This Encounter  Procedures  . TSH    Standing Status:   Future    Number of Occurrences:   1    Standing Expiration Date:   11/13/2018   All questions were answered. The patient knows to call the clinic with any problems, questions or concerns. No barriers to learning was detected. I spent 15 minutes counseling the patient face to face. The total time spent in the appointment was 20 minutes and more than 50% was on counseling and review of test results     [name], MD 11/13/2017 1:06 PM

## 2017-11-13 NOTE — Telephone Encounter (Signed)
Called with below message. 

## 2017-11-13 NOTE — Assessment & Plan Note (Signed)
He has been prescribed multiple different pain medicine Continue the same

## 2017-11-13 NOTE — Progress Notes (Signed)
Brief nutrition follow-up completed with patient in the chemotherapy room. Patient states diarrhea has resolved.  He is back to normal. He is angry that he has been here all day. Reports weight loss however there is no new weight documented today. Patient educated to continue 6 bottles of tube feeding daily with adequate water flushes will continue to follow as needed.

## 2017-11-13 NOTE — Assessment & Plan Note (Signed)
The patient is miserable with chronic pain He was evaluated by ENT last week who felt that his cancer may have recurred CT scan has been ordered and scheduled next week I would keep an eye on the test results If CT confirms cancer progression, I will see him back the day after to review plan of care If CT scan show no evidence of disease progression, we will continue Taxol every 2 weeks for now

## 2017-11-13 NOTE — Patient Instructions (Signed)

## 2017-11-13 NOTE — Assessment & Plan Note (Signed)
The patient had recent diarrhea I will order TSH to exclude hyperthyroidism as a cause of his diarrhea

## 2017-11-13 NOTE — Telephone Encounter (Signed)
-----   Message from [name], MD sent at 11/13/2017  4:15 PM EST ----- Regarding: TSH normal pls let him know thyroid test ok, not the cause of his diarrhea ----- Message ----- From: Interface, Lab In Three Zero One Sent: 11/13/2017   3:59 PM To: [name], MD

## 2017-11-13 NOTE — Patient Instructions (Signed)
[facility] Discharge Instructions for Patients Receiving Chemotherapy  Today you received the following chemotherapy agents:  Taxol.  To help prevent nausea and vomiting after your treatment, we encourage you to take your nausea medication as directed.   If you develop nausea and vomiting that is not controlled by your nausea medication, call the clinic.   BELOW ARE SYMPTOMS THAT SHOULD BE REPORTED IMMEDIATELY:  *FEVER GREATER THAN 100.5 F  *CHILLS WITH OR WITHOUT FEVER  NAUSEA AND VOMITING THAT IS NOT CONTROLLED WITH YOUR NAUSEA MEDICATION  *UNUSUAL SHORTNESS OF BREATH  *UNUSUAL BRUISING OR BLEEDING  TENDERNESS IN MOUTH AND THROAT WITH OR WITHOUT PRESENCE OF ULCERS  *URINARY PROBLEMS  *BOWEL PROBLEMS  UNUSUAL RASH Items with * indicate a potential emergency and should be followed up as soon as possible.  Feel free to call the clinic should you have any questions or concerns. The clinic phone number is [phone].  Please show the CHEMO ALERT CARD at check-in to the Emergency Department and triage nurse.   

## 2017-11-16 ENCOUNTER — Other Ambulatory Visit: Payer: Self-pay

## 2017-11-21 ENCOUNTER — Other Ambulatory Visit: Payer: Self-pay

## 2017-11-21 ENCOUNTER — Inpatient Hospital Stay
Admission: RE | Admit: 2017-11-21 | Discharge: 2017-11-21 | Disposition: A | Discharge status: Home / Self Care | Payer: Self-pay | Source: Ambulatory Visit | Attending: Otolaryngology | Admitting: Otolaryngology

## 2017-11-23 ENCOUNTER — Telehealth: Payer: Self-pay

## 2017-11-23 NOTE — Telephone Encounter (Signed)
Patient called and left message to call him.  Called back, Dr. [name] ordered CT Scan and Barium swallaw for 12/24. He is wanting to get it done earlier and at [facility] if possible. Dr. [name] office closed already, unable to call.

## 2017-11-24 ENCOUNTER — Other Ambulatory Visit: Payer: Self-pay

## 2017-11-24 NOTE — Telephone Encounter (Signed)
He needs to contact Dr. Noreene Filbert office to request that

## 2017-11-27 ENCOUNTER — Ambulatory Visit: Payer: Medicaid Other

## 2017-11-27 ENCOUNTER — Other Ambulatory Visit: Payer: Self-pay | Admitting: *Deleted

## 2017-11-27 ENCOUNTER — Ambulatory Visit (HOSPITAL_BASED_OUTPATIENT_CLINIC_OR_DEPARTMENT_OTHER): Payer: Medicaid Other

## 2017-11-27 ENCOUNTER — Other Ambulatory Visit: Payer: Self-pay

## 2017-11-27 ENCOUNTER — Ambulatory Visit: Payer: Medicaid Other | Admitting: Nutrition

## 2017-11-27 ENCOUNTER — Other Ambulatory Visit (HOSPITAL_BASED_OUTPATIENT_CLINIC_OR_DEPARTMENT_OTHER): Payer: Medicaid Other

## 2017-11-27 VITALS — BP 137/85 | HR 77 | Temp 98.8°F | Resp 18 | Wt 164.2 lb

## 2017-11-27 DIAGNOSIS — Z5111 Encounter for antineoplastic chemotherapy: Secondary | ICD-10-CM

## 2017-11-27 DIAGNOSIS — C321 Malignant neoplasm of supraglottis: Secondary | ICD-10-CM

## 2017-11-27 DIAGNOSIS — G893 Neoplasm related pain (acute) (chronic): Secondary | ICD-10-CM

## 2017-11-27 LAB — CBC WITH DIFFERENTIAL/PLATELET
BASO%: 0.4 % (ref 0.0–2.0)
Basophils Absolute: 0.1 10*3/uL (ref 0.0–0.1)
EOS%: 0.5 % (ref 0.0–7.0)
Eosinophils Absolute: 0.1 10*3/uL (ref 0.0–0.5)
HCT: 35.5 % — ABNORMAL LOW (ref 38.4–49.9)
HEMOGLOBIN: 11.8 g/dL — AB (ref 13.0–17.1)
LYMPH%: 4.7 % — ABNORMAL LOW (ref 14.0–49.0)
MCH: 31.8 pg (ref 27.2–33.4)
MCHC: 33.3 g/dL (ref 32.0–36.0)
MCV: 95.5 fL (ref 79.3–98.0)
MONO#: 0.9 10*3/uL (ref 0.1–0.9)
MONO%: 7.5 % (ref 0.0–14.0)
NEUT%: 86.9 % — ABNORMAL HIGH (ref 39.0–75.0)
NEUTROS ABS: 10.3 10*3/uL — AB (ref 1.5–6.5)
Platelets: 233 10*3/uL (ref 140–400)
RBC: 3.72 10*6/uL — ABNORMAL LOW (ref 4.20–5.82)
RDW: 15.7 % — AB (ref 11.0–14.6)
WBC: 11.8 10*3/uL — AB (ref 4.0–10.3)
lymph#: 0.6 10*3/uL — ABNORMAL LOW (ref 0.9–3.3)

## 2017-11-27 LAB — COMPREHENSIVE METABOLIC PANEL
ALT: 10 U/L (ref 0–55)
ANION GAP: 8 meq/L (ref 3–11)
AST: 11 U/L (ref 5–34)
Albumin: 3.8 g/dL (ref 3.5–5.0)
Alkaline Phosphatase: 75 U/L (ref 40–150)
BILIRUBIN TOTAL: 0.32 mg/dL (ref 0.20–1.20)
BUN: 16.9 mg/dL (ref 7.0–26.0)
CALCIUM: 9.6 mg/dL (ref 8.4–10.4)
CHLORIDE: 101 meq/L (ref 98–109)
CO2: 29 mEq/L (ref 22–29)
CREATININE: 0.8 mg/dL (ref 0.7–1.3)
EGFR: >60 mL/min/{1.73_m2} (ref >60)
Glucose: 128 mg/dl (ref 70–140)
Potassium: 3.9 mEq/L (ref 3.5–5.1)
Sodium: 138 mEq/L (ref 136–145)
TOTAL PROTEIN: 7.2 g/dL (ref 6.4–8.3)

## 2017-11-27 MED ORDER — DIPHENHYDRAMINE HCL 50 MG/ML IJ SOLN
50.0000 mg | Freq: Once | INTRAMUSCULAR | Status: AC
  Administered 2017-11-27: 50 mg via INTRAVENOUS

## 2017-11-27 MED ORDER — HEPARIN SOD (PORK) LOCK FLUSH 100 UNIT/ML IV SOLN
500.0000 [IU] | Freq: Once | INTRAVENOUS | Status: AC | PRN
  Administered 2017-11-27: 500 [IU]
  Filled 2017-11-27: qty 5

## 2017-11-27 MED ORDER — SODIUM CHLORIDE 0.9% FLUSH
10.0000 mL | Freq: Once | INTRAVENOUS | Status: AC
  Administered 2017-11-27: 10 mL
  Filled 2017-11-27: qty 10

## 2017-11-27 MED ORDER — MORPHINE SULFATE 30 MG PO TABS: 60.0000 mg | ORAL_TABLET | ORAL | 0 refills | Status: DC | PRN

## 2017-11-27 MED ORDER — DIPHENHYDRAMINE HCL 50 MG/ML IJ SOLN
INTRAMUSCULAR | Status: AC
  Filled 2017-11-27: qty 1

## 2017-11-27 MED ORDER — SODIUM CHLORIDE 0.9 % IV SOLN
Freq: Once | INTRAVENOUS | Status: AC
  Administered 2017-11-27: 12:00:00 via INTRAVENOUS
  Filled 2017-11-27: qty 5

## 2017-11-27 MED ORDER — FAMOTIDINE IN NACL 20-0.9 MG/50ML-% IV SOLN
INTRAVENOUS | Status: AC
  Filled 2017-11-27: qty 50

## 2017-11-27 MED ORDER — HYDROMORPHONE HCL 4 MG/ML IJ SOLN
2.0000 mg | Freq: Once | INTRAMUSCULAR | Status: AC
Start: 2017-11-27 — End: 2017-11-27
  Administered 2017-11-27: 2 mg via INTRAVENOUS
  Filled 2017-11-27: qty 1

## 2017-11-27 MED ORDER — SODIUM CHLORIDE 0.9 % IV SOLN
80.0000 mg/m2 | Freq: Once | INTRAVENOUS | Status: AC
  Administered 2017-11-27: 156 mg via INTRAVENOUS
  Filled 2017-11-27: qty 26

## 2017-11-27 MED ORDER — HYDROMORPHONE HCL 2 MG/ML IJ SOLN
INTRAMUSCULAR | Status: AC
  Filled 2017-11-27: qty 1

## 2017-11-27 MED ORDER — SODIUM CHLORIDE 0.9 % IV SOLN
Freq: Once | INTRAVENOUS | Status: AC
  Administered 2017-11-27: 12:00:00 via INTRAVENOUS

## 2017-11-27 MED ORDER — FAMOTIDINE IN NACL 20-0.9 MG/50ML-% IV SOLN
20.0000 mg | Freq: Once | INTRAVENOUS | Status: AC
  Administered 2017-11-27: 20 mg via INTRAVENOUS

## 2017-11-27 MED ORDER — SODIUM CHLORIDE 0.9% FLUSH
10.0000 mL | INTRAVENOUS | Status: DC | PRN
  Administered 2017-11-27: 10 mL
  Filled 2017-11-27: qty 10

## 2017-11-27 MED ORDER — PALONOSETRON HCL INJECTION 0.25 MG/5ML
INTRAVENOUS | Status: AC
  Filled 2017-11-27: qty 5

## 2017-11-27 MED ORDER — PALONOSETRON HCL INJECTION 0.25 MG/5ML
0.2500 mg | Freq: Once | INTRAVENOUS | Status: AC
  Administered 2017-11-27: 0.25 mg via INTRAVENOUS

## 2017-11-27 NOTE — Patient Instructions (Signed)
[facility] Discharge Instructions for Patients Receiving Chemotherapy  Today you received the following chemotherapy agents:  Taxol.  To help prevent nausea and vomiting after your treatment, we encourage you to take your nausea medication as directed.   If you develop nausea and vomiting that is not controlled by your nausea medication, call the clinic.   BELOW ARE SYMPTOMS THAT SHOULD BE REPORTED IMMEDIATELY:  *FEVER GREATER THAN 100.5 F  *CHILLS WITH OR WITHOUT FEVER  NAUSEA AND VOMITING THAT IS NOT CONTROLLED WITH YOUR NAUSEA MEDICATION  *UNUSUAL SHORTNESS OF BREATH  *UNUSUAL BRUISING OR BLEEDING  TENDERNESS IN MOUTH AND THROAT WITH OR WITHOUT PRESENCE OF ULCERS  *URINARY PROBLEMS  *BOWEL PROBLEMS  UNUSUAL RASH Items with * indicate a potential emergency and should be followed up as soon as possible.  Feel free to call the clinic should you have any questions or concerns. The clinic phone number is [phone].  Please show the CHEMO ALERT CARD at check-in to the Emergency Department and triage nurse.   

## 2017-11-27 NOTE — Progress Notes (Signed)
Nutrition follow-up completed with patient during infusion for cancer of the supraglottis. Weight documented as 164.2 pounds on December 17, which is decreased from 169.2 pounds November 5. Patient disappointed with weight loss. He reports intermittent diarrhea. States he does not take Imodium as he has been directed to do in the past. Continues on 6 bottles of Osmolite 1.2 and Jevity 1.5 free water before and after bolus feedings. Patient wondering if he can take an additional can of tube feeding when he is hungry.  Nutrition diagnosis: Unintentional weight loss continues.  Intervention: Educated patient to continue strategies for proper tube feeding administration. Offered to change patient's tube feeding formula to a specialty formula to help with diarrhea.  However, patient refuses tube feeding change. Asked patient to continue to take Imodium as patient has been directed. If tolerated patient can increase tube feeding by one can. Questions were answered.  Teach back method used.  Monitoring, evaluation, goals: Patient will tolerate adequate tube feeding to minimize weight loss.  Next visit: Monday, January 14 during infusion.  **Disclaimer: This note was dictated with voice recognition software. Similar sounding words can inadvertently be transcribed and this note may contain transcription errors which may not have been corrected upon publication of note.**

## 2017-12-04 ENCOUNTER — Other Ambulatory Visit: Payer: Self-pay

## 2017-12-04 ENCOUNTER — Ambulatory Visit
Admission: RE | Admit: 2017-12-04 | Discharge: 2017-12-04 | Disposition: A | Discharge status: Home / Self Care | Payer: Medicaid Other | Source: Ambulatory Visit | Attending: Otolaryngology | Admitting: Otolaryngology

## 2017-12-04 DIAGNOSIS — Z8521 Personal history of malignant neoplasm of larynx: Secondary | ICD-10-CM

## 2017-12-04 DIAGNOSIS — R1312 Dysphagia, oropharyngeal phase: Secondary | ICD-10-CM

## 2017-12-04 MED ORDER — IOPAMIDOL (ISOVUE-300) INJECTION 61%
75.0000 mL | Freq: Once | INTRAVENOUS | Status: AC | PRN
  Administered 2017-12-04: 75 mL via INTRAVENOUS

## 2017-12-08 ENCOUNTER — Telehealth: Payer: Self-pay | Admitting: *Deleted

## 2017-12-08 NOTE — Telephone Encounter (Signed)
Patient called requesting reschedule 12-11-2017 appointments due to scheduling conflict to check in at 8:54 pm for another provider appointment.  Reached patient confirming a scheduler reached him to notify of new appointment times.

## 2017-12-11 ENCOUNTER — Ambulatory Visit: Payer: Self-pay

## 2017-12-11 ENCOUNTER — Ambulatory Visit (HOSPITAL_BASED_OUTPATIENT_CLINIC_OR_DEPARTMENT_OTHER): Payer: Medicaid Other

## 2017-12-11 ENCOUNTER — Other Ambulatory Visit: Payer: Self-pay

## 2017-12-11 ENCOUNTER — Other Ambulatory Visit (HOSPITAL_BASED_OUTPATIENT_CLINIC_OR_DEPARTMENT_OTHER): Payer: Medicaid Other

## 2017-12-11 ENCOUNTER — Ambulatory Visit: Payer: Medicaid Other

## 2017-12-11 DIAGNOSIS — Z5111 Encounter for antineoplastic chemotherapy: Secondary | ICD-10-CM | POA: Diagnosis not present

## 2017-12-11 DIAGNOSIS — C321 Malignant neoplasm of supraglottis: Secondary | ICD-10-CM

## 2017-12-11 LAB — CBC WITH DIFFERENTIAL/PLATELET
BASO%: 0.2 % (ref 0.0–2.0)
BASOS ABS: 0 10*3/uL (ref 0.0–0.1)
EOS ABS: 0 10*3/uL (ref 0.0–0.5)
EOS%: 0.3 % (ref 0.0–7.0)
HCT: 37.9 % — ABNORMAL LOW (ref 38.4–49.9)
HEMOGLOBIN: 12.6 g/dL — AB (ref 13.0–17.1)
LYMPH%: 3.7 % — ABNORMAL LOW (ref 14.0–49.0)
MCH: 31.5 pg (ref 27.2–33.4)
MCHC: 33.2 g/dL (ref 32.0–36.0)
MCV: 94.8 fL (ref 79.3–98.0)
MONO#: 0.7 10*3/uL (ref 0.1–0.9)
MONO%: 5.8 % (ref 0.0–14.0)
NEUT%: 90 % — ABNORMAL HIGH (ref 39.0–75.0)
NEUTROS ABS: 10.5 10*3/uL — AB (ref 1.5–6.5)
PLATELETS: 236 10*3/uL (ref 140–400)
RBC: 4 10*6/uL — ABNORMAL LOW (ref 4.20–5.82)
RDW: 15.8 % — AB (ref 11.0–14.6)
WBC: 11.6 10*3/uL — AB (ref 4.0–10.3)
lymph#: 0.4 10*3/uL — ABNORMAL LOW (ref 0.9–3.3)

## 2017-12-11 LAB — COMPREHENSIVE METABOLIC PANEL
ALT: 12 U/L (ref 0–55)
ANION GAP: 8 meq/L (ref 3–11)
AST: 11 U/L (ref 5–34)
Albumin: 3.7 g/dL (ref 3.5–5.0)
Alkaline Phosphatase: 75 U/L (ref 40–150)
BUN: 11.8 mg/dL (ref 7.0–26.0)
CALCIUM: 9.5 mg/dL (ref 8.4–10.4)
CHLORIDE: 104 meq/L (ref 98–109)
CO2: 27 meq/L (ref 22–29)
CREATININE: 0.7 mg/dL (ref 0.7–1.3)
EGFR: >60 mL/min/{1.73_m2} (ref >60)
Glucose: 119 mg/dl (ref 70–140)
POTASSIUM: 3.6 meq/L (ref 3.5–5.1)
Sodium: 139 mEq/L (ref 136–145)
Total Bilirubin: 0.42 mg/dL (ref 0.20–1.20)
Total Protein: 7.2 g/dL (ref 6.4–8.3)

## 2017-12-11 MED ORDER — PALONOSETRON HCL INJECTION 0.25 MG/5ML
0.2500 mg | Freq: Once | INTRAVENOUS | Status: AC
  Administered 2017-12-11: 0.25 mg via INTRAVENOUS

## 2017-12-11 MED ORDER — DIPHENHYDRAMINE HCL 50 MG/ML IJ SOLN
INTRAMUSCULAR | Status: AC
  Filled 2017-12-11: qty 1

## 2017-12-11 MED ORDER — DIPHENHYDRAMINE HCL 50 MG/ML IJ SOLN
50.0000 mg | Freq: Once | INTRAMUSCULAR | Status: AC
  Administered 2017-12-11: 50 mg via INTRAVENOUS

## 2017-12-11 MED ORDER — HYDROMORPHONE HCL 2 MG/ML IJ SOLN
INTRAMUSCULAR | Status: AC
  Filled 2017-12-11: qty 1

## 2017-12-11 MED ORDER — FAMOTIDINE IN NACL 20-0.9 MG/50ML-% IV SOLN
INTRAVENOUS | Status: AC
  Filled 2017-12-11: qty 50

## 2017-12-11 MED ORDER — SODIUM CHLORIDE 0.9 % IV SOLN
Freq: Once | INTRAVENOUS | Status: AC
  Administered 2017-12-11: 10:00:00 via INTRAVENOUS

## 2017-12-11 MED ORDER — SODIUM CHLORIDE 0.9 % IV SOLN
80.0000 mg/m2 | Freq: Once | INTRAVENOUS | Status: AC
  Administered 2017-12-11: 156 mg via INTRAVENOUS
  Filled 2017-12-11: qty 26

## 2017-12-11 MED ORDER — FAMOTIDINE IN NACL 20-0.9 MG/50ML-% IV SOLN
20.0000 mg | Freq: Once | INTRAVENOUS | Status: AC
  Administered 2017-12-11: 20 mg via INTRAVENOUS

## 2017-12-11 MED ORDER — SODIUM CHLORIDE 0.9% FLUSH
10.0000 mL | INTRAVENOUS | Status: DC | PRN
  Administered 2017-12-11: 10 mL
  Filled 2017-12-11: qty 10

## 2017-12-11 MED ORDER — PALONOSETRON HCL INJECTION 0.25 MG/5ML
INTRAVENOUS | Status: AC
  Filled 2017-12-11: qty 5

## 2017-12-11 MED ORDER — HYDROMORPHONE HCL 4 MG/ML IJ SOLN
2.0000 mg | Freq: Once | INTRAMUSCULAR | Status: AC
  Administered 2017-12-11: 2 mg via INTRAVENOUS
  Filled 2017-12-11: qty 1

## 2017-12-11 MED ORDER — HEPARIN SOD (PORK) LOCK FLUSH 100 UNIT/ML IV SOLN
500.0000 [IU] | Freq: Once | INTRAVENOUS | Status: AC | PRN
  Administered 2017-12-11: 500 [IU]
  Filled 2017-12-11: qty 5

## 2017-12-11 MED ORDER — SODIUM CHLORIDE 0.9 % IV SOLN
Freq: Once | INTRAVENOUS | Status: AC
  Administered 2017-12-11: 11:00:00 via INTRAVENOUS
  Filled 2017-12-11: qty 5

## 2017-12-11 MED FILL — LEVOTHYROXINE 100 MCG TAB: 100 | 30 days supply | Qty: 30 | Fill #9

## 2017-12-11 NOTE — Patient Instructions (Signed)

## 2017-12-11 NOTE — Patient Instructions (Signed)
[facility] Discharge Instructions for Patients Receiving Chemotherapy  Today you received the following chemotherapy agents:  Taxol.  To help prevent nausea and vomiting after your treatment, we encourage you to take your nausea medication as directed.   If you develop nausea and vomiting that is not controlled by your nausea medication, call the clinic.   BELOW ARE SYMPTOMS THAT SHOULD BE REPORTED IMMEDIATELY:  *FEVER GREATER THAN 100.5 F  *CHILLS WITH OR WITHOUT FEVER  NAUSEA AND VOMITING THAT IS NOT CONTROLLED WITH YOUR NAUSEA MEDICATION  *UNUSUAL SHORTNESS OF BREATH  *UNUSUAL BRUISING OR BLEEDING  TENDERNESS IN MOUTH AND THROAT WITH OR WITHOUT PRESENCE OF ULCERS  *URINARY PROBLEMS  *BOWEL PROBLEMS  UNUSUAL RASH Items with * indicate a potential emergency and should be followed up as soon as possible.  Feel free to call the clinic should you have any questions or concerns. The clinic phone number is [phone].  Please show the CHEMO ALERT CARD at check-in to the Emergency Department and triage nurse.   

## 2017-12-14 MED FILL — MORPHINE SULFATE IR 30 MG T: 30 | 6 days supply | Qty: 80 | Fill #0

## 2017-12-18 MED FILL — OMEPRAZOLE DR 40 MG CAPSULE: 40 | 30 days supply | Qty: 60 | Fill #1

## 2017-12-25 ENCOUNTER — Inpatient Hospital Stay: Payer: Medicaid Other

## 2017-12-25 ENCOUNTER — Inpatient Hospital Stay (HOSPITAL_BASED_OUTPATIENT_CLINIC_OR_DEPARTMENT_OTHER): Payer: Medicaid Other | Admitting: Hematology and Oncology

## 2017-12-25 ENCOUNTER — Inpatient Hospital Stay: Payer: Medicaid Other | Attending: Hematology and Oncology

## 2017-12-25 ENCOUNTER — Encounter: Payer: Self-pay | Admitting: Nutrition

## 2017-12-25 ENCOUNTER — Telehealth: Payer: Self-pay | Admitting: Hematology and Oncology

## 2017-12-25 ENCOUNTER — Encounter: Payer: Self-pay | Admitting: Hematology and Oncology

## 2017-12-25 ENCOUNTER — Other Ambulatory Visit: Payer: Self-pay | Admitting: Hematology and Oncology

## 2017-12-25 DIAGNOSIS — C770 Secondary and unspecified malignant neoplasm of lymph nodes of head, face and neck: Secondary | ICD-10-CM | POA: Diagnosis not present

## 2017-12-25 DIAGNOSIS — Z930 Tracheostomy status: Secondary | ICD-10-CM | POA: Insufficient documentation

## 2017-12-25 DIAGNOSIS — Z66 Do not resuscitate: Secondary | ICD-10-CM

## 2017-12-25 DIAGNOSIS — G893 Neoplasm related pain (acute) (chronic): Secondary | ICD-10-CM | POA: Diagnosis not present

## 2017-12-25 DIAGNOSIS — Z5111 Encounter for antineoplastic chemotherapy: Secondary | ICD-10-CM | POA: Insufficient documentation

## 2017-12-25 DIAGNOSIS — C321 Malignant neoplasm of supraglottis: Secondary | ICD-10-CM | POA: Diagnosis not present

## 2017-12-25 LAB — CBC WITH DIFFERENTIAL/PLATELET
BASOS ABS: 0 10*3/uL (ref 0.0–0.1)
BASOS PCT: 0 %
EOS PCT: 0 %
Eosinophils Absolute: 0.1 10*3/uL (ref 0.0–0.5)
HCT: 35.4 % — ABNORMAL LOW (ref 38.4–49.9)
Hemoglobin: 11.7 g/dL — ABNORMAL LOW (ref 13.0–17.1)
Lymphocytes Relative: 4 %
Lymphs Abs: 0.5 10*3/uL — ABNORMAL LOW (ref 0.9–3.3)
MCH: 31.5 pg (ref 27.2–33.4)
MCHC: 33.1 g/dL (ref 32.0–36.0)
MCV: 95.2 fL (ref 79.3–98.0)
MONO ABS: 0.8 10*3/uL (ref 0.1–0.9)
Monocytes Relative: 7 %
Neutro Abs: 10.8 10*3/uL — ABNORMAL HIGH (ref 1.5–6.5)
Neutrophils Relative %: 89 %
PLATELETS: 216 10*3/uL (ref 140–400)
RBC: 3.72 MIL/uL — ABNORMAL LOW (ref 4.20–5.82)
RDW: 15.2 % (ref 11.0–15.6)
WBC: 12.3 10*3/uL — ABNORMAL HIGH (ref 4.0–10.3)

## 2017-12-25 LAB — COMPREHENSIVE METABOLIC PANEL
ALBUMIN: 3.7 g/dL (ref 3.5–5.0)
ALK PHOS: 71 U/L (ref 40–150)
ALT: 12 U/L (ref 0–55)
AST: 12 U/L (ref 5–34)
Anion gap: 10 (ref 3–11)
BILIRUBIN TOTAL: 0.3 mg/dL (ref 0.2–1.2)
BUN: 13 mg/dL (ref 7–26)
CALCIUM: 9.7 mg/dL (ref 8.4–10.4)
CO2: 30 mmol/L — ABNORMAL HIGH (ref 22–29)
CREATININE: 0.74 mg/dL (ref 0.70–1.30)
Chloride: 101 mmol/L (ref 98–109)
GFR calc Af Amer: >60 mL/min (ref >60)
GFR calc non Af Amer: >60 mL/min (ref >60)
GLUCOSE: 107 mg/dL (ref 70–140)
Potassium: 3.6 mmol/L (ref 3.5–5.1)
Sodium: 141 mmol/L (ref 136–145)
TOTAL PROTEIN: 7.1 g/dL (ref 6.4–8.3)

## 2017-12-25 MED ORDER — HYDROMORPHONE HCL 2 MG/ML IJ SOLN
INTRAMUSCULAR | Status: AC
  Filled 2017-12-25: qty 2

## 2017-12-25 MED ORDER — DIPHENHYDRAMINE HCL 50 MG/ML IJ SOLN
50.0000 mg | Freq: Once | INTRAMUSCULAR | Status: AC
  Administered 2017-12-25: 50 mg via INTRAVENOUS

## 2017-12-25 MED ORDER — PALONOSETRON HCL INJECTION 0.25 MG/5ML
0.2500 mg | Freq: Once | INTRAVENOUS | Status: AC
  Administered 2017-12-25: 0.25 mg via INTRAVENOUS

## 2017-12-25 MED ORDER — FAMOTIDINE IN NACL 20-0.9 MG/50ML-% IV SOLN
20.0000 mg | Freq: Once | INTRAVENOUS | Status: AC
  Administered 2017-12-25: 20 mg via INTRAVENOUS

## 2017-12-25 MED ORDER — HEPARIN SOD (PORK) LOCK FLUSH 100 UNIT/ML IV SOLN
500.0000 [IU] | Freq: Once | INTRAVENOUS | Status: AC | PRN
  Administered 2017-12-25: 500 [IU]
  Filled 2017-12-25: qty 5

## 2017-12-25 MED ORDER — HYDROMORPHONE HCL 4 MG/ML IJ SOLN
4.0000 mg | Freq: Once | INTRAMUSCULAR | Status: DC
  Filled 2017-12-25: qty 1

## 2017-12-25 MED ORDER — HYDROMORPHONE HCL 4 MG/ML IJ SOLN
4.0000 mg | Freq: Once | INTRAMUSCULAR | Status: DC
Start: 2017-12-25 — End: 2017-12-25

## 2017-12-25 MED ORDER — PACLITAXEL CHEMO INJECTION 300 MG/50ML
80.0000 mg/m2 | Freq: Once | INTRAVENOUS | Status: AC
  Administered 2017-12-25: 156 mg via INTRAVENOUS
  Filled 2017-12-25: qty 26

## 2017-12-25 MED ORDER — HYDROMORPHONE HCL 2 MG/ML IJ SOLN
4.0000 mg | Freq: Once | INTRAMUSCULAR | Status: AC
Start: 2017-12-25 — End: 2017-12-25
  Administered 2017-12-25: 4 mg via INTRAVENOUS

## 2017-12-25 MED ORDER — SODIUM CHLORIDE 0.9 % IV SOLN
Freq: Once | INTRAVENOUS | Status: AC
  Administered 2017-12-25: 14:00:00 via INTRAVENOUS

## 2017-12-25 MED ORDER — PALONOSETRON HCL INJECTION 0.25 MG/5ML
INTRAVENOUS | Status: AC
  Filled 2017-12-25: qty 5

## 2017-12-25 MED ORDER — SODIUM CHLORIDE 0.9 % IV SOLN
Freq: Once | INTRAVENOUS | Status: AC
  Administered 2017-12-25: 14:00:00 via INTRAVENOUS
  Filled 2017-12-25: qty 5

## 2017-12-25 MED ORDER — DIPHENHYDRAMINE HCL 50 MG/ML IJ SOLN
INTRAMUSCULAR | Status: AC
  Filled 2017-12-25: qty 1

## 2017-12-25 MED ORDER — FAMOTIDINE IN NACL 20-0.9 MG/50ML-% IV SOLN
INTRAVENOUS | Status: AC
  Filled 2017-12-25: qty 50

## 2017-12-25 MED ORDER — SODIUM CHLORIDE 0.9% FLUSH
10.0000 mL | INTRAVENOUS | Status: DC | PRN
  Administered 2017-12-25: 10 mL
  Filled 2017-12-25: qty 10

## 2017-12-25 MED ORDER — SODIUM CHLORIDE 0.9% FLUSH
10.0000 mL | Freq: Once | INTRAVENOUS | Status: AC
  Administered 2017-12-25: 10 mL
  Filled 2017-12-25: qty 10

## 2017-12-25 NOTE — Progress Notes (Signed)
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[place])   03/15/2015 - 03/17/2015 Hospital Admission    He was admitted to the hospital for evaluation of dysphagia, SOB, hemoptosis, hoarseness, 30-40 pound weight loss and worsening bilateral neck masses for 5 months      03/15/2015 Imaging    Ct showed extensive circumferential malignancy in the hypopharyngeal/supraglottic region with regional LN metastases      03/16/2015 Procedure    He underwent ULTRASOUND-GUIDED BIOPSY OF LEFT CERVICAL LYMPH NODES      03/16/2015 Pathology Results    Accession: [id number] LN biopsy showed invasive squamous cell cancer      03/16/2015 Pathology Results    Accession: [id number] showed atypical squamous cells      03/25/2015 - 04/07/2015 Hospital Admission    He was admitted to the hospital and underwent tracheostomy placement, feeeding tube placement but subsequently left [facility]      03/26/2015 Surgery    He had multiple extraction of tooth numbers 1, 2, 5, 6, 7, 8, 9, 10, 11, 12, 13, 18, 19, 21, 22, 23, 24, 25, 26, 27, 28, and 29. and 4 Quadrants of alveoloplasty      03/26/2015 Surgery    He underwent tracheostomy      03/31/2015 Surgery    He had open gastrostomy tube placement by Dr. Donne Hazel      04/16/2015 - 05/18/2015 Radiation Therapy    Laryngopharynx and bilateral neck / 50 Gy in 20 fractions to gross disease, 45 Gy in 20 fractions to high risk nodal echelons  Beams/energy: Helical IMRT / 6 MV photons      04/16/2015 Procedure    Fluoroscopic reposition of the 18 French gastrostomy confirmed back in the stomach,       07/03/2015 Procedure    IR performed replacement of gastrostomy tube with a new 60 French balloon retention tube      10/01/2015 Imaging    PEt scan showed persistent hypermetabolism within the primary supraglottic laryngeal tumor and within right retropharyngeal, bilateral level II and left level IV cervical nodal metastases      10/28/2015 Procedure    He had placement of PICC line. The IR was not able to place PORT due to suspected upper respiratory infection      11/13/2015 - 03/21/2016 Chemotherapy    He received 5FU, carboplatin chemo with weekly Erbitux      11/19/2015 Surgery    Gastrostomy tube replaced.      11/30/2015 Procedure    Placement of right jugular port-a-cath.      11/30/2015 Procedure    PICC removed.      12/30/2015 Imaging    PET CT showed positive response to Rx      02/26/2016 Procedure    He underwent direct laryngoscopy with biopsy. Esophageal dilatation.       02/26/2016 Pathology Results    Repeat biopsy of supraglottis showed persistent disease      02/29/2016 Procedure    Gastrostomy tube exchanged.      03/28/2016 PET scan    Hypermetabolic tissue in the posterior RIGHT hypopharynx is similar in pattern to PET-CT of 12/30/2015 but increased in metabolic activity.2. Hypermetabolic  tissue / lymph nodes in the LEFT supraclavicular nodal station are in a similar pattern       04/04/2016 - 11/29/2016 Chemotherapy    He started on palliative chemotherapy with pembrolizumab      05/03/2016 Imaging    MRI head is negative      06/02/2016 Imaging    Mild improvement of diffuse pharyngeal and supraglottic edema.Increased asymmetry of right-sided hypopharyngeal/supraglottic softtissue compared to the prior neck CT with FDG uptake in this region on prior PET-CT. Residual/recurrent tumor is possible      08/09/2016 Procedure    IR placed new 20 French percutaneous gastrostomy tube.      08/26/2016 Imaging    Ct neck showed unchanged diffuse  pharyngeal and supraglottic edema as well as asymmetric soft tissue thickening on the right. Slightly decreased size of some left level II lymph nodes. Unchanged lymphadenopathy elsewhere in the neck. Decreased size of thyroid mass. No evidence of metastatic cancer to the chest      12/13/2016 Imaging    Ct chest showed no evidence of thoracic metastatic disease or primary thoracic malignancy.      12/13/2016 Imaging    Ct neck showed progression of of RIGHT supraglottic mass compared with priors, approximate size 24 x 27 x 27 mm. Extension caudally along the RIGHT area epiglottic fold with increasing mass effect on the airway. Tracheostomy satisfactory position. Stable to slightly improved malignant adenopathy.      12/26/2016 -  Chemotherapy    He received chemotherapy with weekly Taxol       02/10/2017 Imaging    Very similar appearance to the study of January. Right sided supraglottic to glottic mass is similar, perhaps a few mm smaller. Bilateral enlarged nodes appear the same. No progressive finding      02/10/2017 Imaging    No evidence of metastatic disease in the abdomen/pelvis. Percutaneous gastrostomy in satisfactory position. Cholelithiasis, without associated inflammatory changes.      06/06/2017 Imaging    CT neck: Supraglottic tumor on the right is mildly smaller now measuring 23 x 16 mm. Improvement in bilateral cervical adenopathy. No new adenopathy identified      09/06/2017 Imaging    1. Essentially stable post treatment CT appearance of the neck since June (see #3). 2. Residual right supraglottic soft tissue asymmetry and indistinct 16 mm right level II nodal tissue is superimposed on post treatment changes including diffuse pharyngeal mucosal space soft tissue thickening. 3. A 15 mm round low-density mass in the left thyroid region is slowly enlarging and favored to be a thyroid nodule rather than on necrotic lymph node. Attention on follow-up Neck CT versus dedicated Thyroid  Ultrasound recommended.      12/04/2017 Imaging    1. Interval enlargement of right level II and left level IV lymph nodes most consistent with progressive metastatic disease. 2. Further enlargement of posterior left thyroid region mass also concerning for progressive metastatic disease (either nodal or extranodal) rather than a benign thyroid nodule. 3. Stable to minimal increase of the residual right supraglottic masslike soft tissue.       INTERVAL HISTORY: Please see below for problem oriented charting. He returns for further follow-up Multiple recent appointment had been rescheduled due to the weather and patient's preference He had recent CT imaging which showed worsening the patient complained of severe pain He blamed the pain on the tracheostomy appliance He had intermittent bleeding coming from the tracheostomy site He felt better after each infusion appointment here for  whatever reason He denies severe peripheral neuropathy Denies recent cough, chest pain or shortness of breath  REVIEW OF SYSTEMS:   Constitutional: Denies fevers, chills or abnormal weight loss Eyes: Denies blurriness of vision Ears, nose, mouth, throat, and face: Denies mucositis or sore throat Respiratory: Denies cough, dyspnea or wheezes Cardiovascular: Denies palpitation, chest discomfort or lower extremity swelling Gastrointestinal:  Denies nausea, heartburn or change in bowel habits Skin: Denies abnormal skin rashes Lymphatics: Denies new lymphadenopathy or easy bruising Neurological:Denies numbness, tingling or new weaknesses Behavioral/Psych: Mood is stable, no new changes  All other systems were reviewed with the patient and are negative.  I have reviewed the past medical history, past surgical history, social history and family history with the patient and they are unchanged from previous note.  ALLERGIES:  is allergic to aspirin.  MEDICATIONS:  Current Outpatient Medications  Medication  Sig Dispense Refill  . amoxicillin (AMOXIL) 250 MG/5ML suspension Place 10 mLs (500 mg total) into feeding tube 2 (two) times daily. For 7 days 140 mL 0  . fentaNYL (DURAGESIC - DOSED MCG/HR) 25 MCG/HR patch Place 1 patch (25 mcg total) every 3 (three) days onto the skin. 5 patch 0  . fluticasone (FLONASE) 50 MCG/ACT nasal spray Place 2 sprays into both nostrils daily. 16 g 2  . gabapentin (NEURONTIN) 300 MG capsule Place 1 capsule (300 mg total) 3 (three) times daily into feeding tube. 90 capsule 11  . levothyroxine (SYNTHROID) 100 MCG tablet Take 1 tablet (100 mcg total) by mouth daily before breakfast. 30 tablet 9  . morphine (MS CONTIN) 100 MG 12 hr tablet Take 1 tablet (100 mg total) by mouth every 8 (eight) hours. 90 tablet 0  . morphine (MSIR) 30 MG tablet Take 2 tablets (60 mg total) by mouth every 4 (four) hours as needed for severe pain. 90 tablet 0  . morphine (ROXANOL) 20 MG/ML concentrated solution Place 1 mL (20 mg total) every 2 (two) hours as needed into feeding tube for severe pain. 240 mL 0  . Nutritional Supplements (FEEDING SUPPLEMENT, JEVITY 1.5 CAL/FIBER,) LIQD Give 1 can Osmolite 1.2 + 1 can of Jevity 1.5 QID via PEG with 60 cc free water before and after bolus. Flush with 240 cc free water BID between feedings. 948 mL   . omeprazole (PRILOSEC) 40 MG capsule Take 1 capsule (40 mg total) by mouth daily. 90 capsule 3  . ondansetron (ZOFRAN) 8 MG tablet TAKE 1 TABLET BY MOUTH EVERY 8 HOURS AS NEEDED FOR NAUSEA 60 tablet 3  . scopolamine (TRANSDERM-SCOP, 1.5 MG,) 1 MG/3DAYS Place 1 patch (1.5 mg total) onto the skin every 3 (three) days. 10 patch 12  . sodium chloride irrigation 0.9 % irrigation USE TO CLEAN AROUND TRACH AND PERFORM TRACH CARE ONCE DAILY AND AS NEEDED 1000 mL 11  . triamcinolone (NASACORT AQ) 55 MCG/ACT AERO nasal inhaler Place 2 sprays into the nose daily. 1 Inhaler 12   Current Facility-Administered Medications  Medication Dose Route Frequency Provider Last Rate  Last Dose  . guaiFENesin-dextromethorphan (ROBITUSSIN DM) 100-10 MG/5ML syrup 10 mL  10 mL Oral Q4H PRN Alvy Bimler, Rendy Lazard, MD   10 mL at 01/09/17 1427   Facility-Administered Medications Ordered in Other Visits  Medication Dose Route Frequency Provider Last Rate Last Dose  . 0.9 %  sodium chloride infusion   Intravenous Once Kyngston Pickelsimer, MD      . alteplase (CATHFLO ACTIVASE) injection 2 mg  2 mg Intracatheter Once PRN [name], MD      .  heparin lock flush 100 unit/mL  500 Units Intracatheter Once PRN Alvy Bimler, Danen Lapaglia, MD      . heparin lock flush 100 unit/mL  250 Units Intracatheter Once PRN Alvy Bimler, Katee Wentland, MD      . ondansetron (ZOFRAN) injection 8 mg  8 mg Intravenous Once Drue Second R, NP      . sodium chloride 0.9 % injection 10 mL  10 mL Intracatheter PRN Alvy Bimler, Malachi Kinzler, MD   10 mL at 01/02/17 1107  . sodium chloride 0.9 % injection 10 mL  10 mL Intracatheter PRN Dinorah Masullo, MD      . sodium chloride flush (NS) 0.9 % injection 10 mL  10 mL Intracatheter PRN Alvy Bimler, Jillyn Stacey, MD   10 mL at 12/26/16 1708  . sodium chloride flush (NS) 0.9 % injection 10 mL  10 mL Intracatheter PRN Alvy Bimler, Dewight Catino, MD   10 mL at 07/24/17 1704  . sodium chloride flush (NS) 0.9 % injection 10 mL  10 mL Intracatheter PRN [name], MD   10 mL at 12/25/17 1605    PHYSICAL EXAMINATION: ECOG PERFORMANCE STATUS: 2 - Symptomatic, <50% confined to bed  Vitals:   12/25/17 1225  BP: 119/76  Pulse: 94  Resp: 20  Temp: 98.8 F (37.1 C)  SpO2: 100%   Filed Weights   12/25/17 1225  Weight: 162 lb 3.2 oz (73.6 kg)    GENERAL:alert, in mild distress from pain SKIN: skin color, texture, turgor are normal, no rashes or significant lesions EYES: normal, Conjunctiva are pink and non-injected, sclera clear OROPHARYNX:no exudate, no erythema and lips, buccal mucosa, and tongue normal  NECK: Tracheostomy site looks okay without signs of bleeding   lYMPH:  no palpable lymphadenopathy in the cervical, axillary or inguinal LUNGS:  Diffuse wheezes throughout HEART: regular rate & rhythm and no murmurs and no lower extremity edema ABDOMEN:abdomen soft, non-tender and normal bowel sounds Musculoskeletal:no cyanosis of digits and no clubbing  NEURO: alert & oriented x 3 with fluent speech, no focal motor/sensory deficits  LABORATORY DATA:  I have reviewed the data as listed    Component Value Date/Time   NA 141 12/25/2017 1152   NA 139 12/11/2017 0910   K 3.6 12/25/2017 1152   K 3.6 12/11/2017 0910   CL 101 12/25/2017 1152   CO2 30 (H) 12/25/2017 1152   CO2 27 12/11/2017 0910   GLUCOSE 107 12/25/2017 1152   GLUCOSE 119 12/11/2017 0910   BUN 13 12/25/2017 1152   BUN 11.8 12/11/2017 0910   CREATININE 0.74 12/25/2017 1152   CREATININE 0.7 12/11/2017 0910   CALCIUM 9.7 12/25/2017 1152   CALCIUM 9.5 12/11/2017 0910   PROT 7.1 12/25/2017 1152   PROT 7.2 12/11/2017 0910   ALBUMIN 3.7 12/25/2017 1152   ALBUMIN 3.7 12/11/2017 0910   AST 12 12/25/2017 1152   AST 11 12/11/2017 0910   ALT 12 12/25/2017 1152   ALT 12 12/11/2017 0910   ALKPHOS 71 12/25/2017 1152   ALKPHOS 75 12/11/2017 0910   BILITOT 0.3 12/25/2017 1152   BILITOT 0.42 12/11/2017 0910   GFRNONAA >60 12/25/2017 1152   GFRAA >60 12/25/2017 1152    No results found for: SPEP, UPEP  Lab Results  Component Value Date   WBC 12.3 (H) 12/25/2017   NEUTROABS 10.8 (H) 12/25/2017   HGB 11.7 (L) 12/25/2017   HCT 35.4 (L) 12/25/2017   MCV 95.2 12/25/2017   PLT 216 12/25/2017      Chemistry      Component  Value Date/Time   NA 141 12/25/2017 1152   NA 139 12/11/2017 0910   K 3.6 12/25/2017 1152   K 3.6 12/11/2017 0910   CL 101 12/25/2017 1152   CO2 30 (H) 12/25/2017 1152   CO2 27 12/11/2017 0910   BUN 13 12/25/2017 1152   BUN 11.8 12/11/2017 0910   CREATININE 0.74 12/25/2017 1152   CREATININE 0.7 12/11/2017 0910      Component Value Date/Time   CALCIUM 9.7 12/25/2017 1152   CALCIUM 9.5 12/11/2017 0910   ALKPHOS 71 12/25/2017 1152    ALKPHOS 75 12/11/2017 0910   AST 12 12/25/2017 1152   AST 11 12/11/2017 0910   ALT 12 12/25/2017 1152   ALT 12 12/11/2017 0910   BILITOT 0.3 12/25/2017 1152   BILITOT 0.42 12/11/2017 0910       RADIOGRAPHIC STUDIES: I have reviewed multiple imaging study with the patient I have personally reviewed the radiological images as listed and agreed with the findings in the report. Ct Soft Tissue Neck W Contrast  Result Date: 12/04/2017 CLINICAL DATA:  History of right supraglottic squamous cell carcinoma. Completed radiation therapy. Continuing chemotherapy. EXAM: CT NECK WITH CONTRAST TECHNIQUE: Multidetector CT imaging of the neck was performed using the standard protocol following the bolus administration of intravenous contrast. CONTRAST:  63mL ISOVUE-300 IOPAMIDOL (ISOVUE-300) INJECTION 61% COMPARISON:  09/06/2017 FINDINGS: Pharynx and larynx: Generalized pharyngeal and laryngeal mucosal edema is similar to the prior study and consistent with changes of radiation therapy. Asymmetric right supraglottic laryngeal soft tissue is stable to minimally more prominent than on the prior study. This soft tissue remains relatively hypoattenuating without a hyperenhancing mass identified. A tracheostomy tube remains in place. Salivary glands: Unchanged parotid and submandibular glands without evidence of mass, calculi, or ductal dilatation. Thyroid: Hypodense mass in the posterior left thyroid region has further enlarged, measuring approximately 1.9 x 1.9 cm and with some mass effect on the trachea. Lymph nodes: Interval enlargement of a necrotic right level IIa lymph node, now measuring 2.5 x 2.1 cm (previously 1.6 x 1.4 cm). Ill-defined hypodense soft tissue in the left neck at the level IV region has enlarged from the prior study, difficult to measure due to its poorly defined margins and streak artifact through this region though estimated at 1.7 x 1.7 cm (series 203, image 104). Fat stranding and obscured fat  planes in the neck bilaterally are noted related to prior radiation therapy. Vascular: Mild calcified atherosclerosis at the carotid bifurcations. Major vascular structures of the neck are grossly patent. Partially visualized right internal jugular central venous catheter. Limited intracranial: Unremarkable. Visualized orbits: Unremarkable. Mastoids and visualized paranasal sinuses: Minimal bilateral maxillary sinus mucosal thickening. No fluid. Clear mastoid air cells. Skeleton: No acute osseous abnormality or suspicious osseous lesion. Upper chest: Mild scarring/ fibrosis in the lung apices. Other: None. IMPRESSION: 1. Interval enlargement of right level II and left level IV lymph nodes most consistent with progressive metastatic disease. 2. Further enlargement of posterior left thyroid region mass also concerning for progressive metastatic disease (either nodal or extranodal) rather than a benign thyroid nodule. 3. Stable to minimal increase of the residual right supraglottic masslike soft tissue. Electronically Signed   By: Logan Bores M.D.   On: 12/04/2017 11:47   Dg Esophagus  Result Date: 12/04/2017 CLINICAL DATA:  Dysphagia with need for tracheostomy and nasal suction after swallowing. 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 Tracheostomy. EXAM: ESOPHOGRAM/BARIUM SWALLOW TECHNIQUE: Single contrast examination was performed using  thin barium. FLUOROSCOPY TIME:  Fluoroscopy Time: 1 minutes and 36 seconds of low-dose pulsed fluoro. Radiation Exposure Index (if provided by the fluoroscopic device): 29 mGy Number of Acquired Spot Images: 0 COMPARISON:  Esophagram 02/15/2016.  CT neck 12/04/2017. FINDINGS: The patient swallowed the barium without difficulty. Again demonstrated is postsurgical distortion of the pharynx with irregularity of the posterior wall of the cervical esophagus. Nasopharyngeal reflux was again noted. There is also silent aspiration of barium into  the proximal trachea. Patient has a tracheostomy. Barium did not pass inferior to the tracheostomy. The thoracic esophagus appears normal with normal motility and no mucosal abnormality. There is no significant hiatal hernia. Patient reports inability to take whole pills, and no barium pill administered. Patient did require nasal and tracheostomy suction during the examination. Reports this is standard whenever he drinks liquids. IMPRESSION: 1. Silent aspiration of barium. Barium did not pass inferior to the tracheostomy. 2. Diffuse distortion of the pharynx from previous surgery and radiation therapy. There is irregularity of the posterior wall of the cervical esophagus which is nonspecific. Refer to neck CT report same date. 3. No abnormality of the thoracic esophagus seen. Electronically Signed   By: Richardean Sale M.D.   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[place]) I have reviewed most recent CT imaging with the patient His most recent CT scan right before Christmas time showed worsening disease control The patient has significant amount of pain We discussed discontinuation of current regimen and switching him to other modalities The patient would like to proceed with one last dose today because the treatment typically makes him feel better I suspect he felt better because of the IV pain medicine and IV steroids and unrelated to the perceived beneficial effect from Taxol Ultimately, he is undecided and wants to proceed with treatment as scheduled I would discontinue his treatment after today I will bring him back next week for further discussion about plan of care The patient has progressed on multiple regimen He certainly has not tried cisplatin-based regimen although I doubt he can tolerate it due to potential risk of uncontrolled nausea and vomiting Single agent methotrexate could be given but after expected benefit would be less than 20% The  patient is not interested to pursue palliative care/hospice yet  Cancer associated pain He has severe, uncontrolled pain due to disease I proceed to refill his prescription pain medicine He is very angry because he felt that the trach is the cause of his pain I suspect is due to disease progression We will address pain management in his next visit Per patient request, I would proceed with IV Dilaudid today during infusion for better pain control during infusion  Metastasis to lymph nodes ([place]) Based on recent imaging study, he had worsening disease control  on examination, the lymph node is not palpable  Tracheostomy status ([place]) He complained of severe local pain related to tracheostomy site He is very angry with his ENT physician We discussed potential referral to see a different ENT specialist if needed but the patient would discuss further tracheostomy care with his ENT doctor first  DNR (do not resuscitate) We had extensive discussion about goals of care from previous visit The patient has verbalized the desire to remain DNR He is not able to make further discussion about stopping treatment today I will review goals of care again in his next visit   No orders of the defined types were placed in  this encounter.  All questions were answered. The patient knows to call the clinic with any problems, questions or concerns. No barriers to learning was detected. I spent 25 minutes counseling the patient face to face. The total time spent in the appointment was 40 minutes and more than 50% was on counseling and review of test results     [name], MD 12/25/2017 4:50 PM

## 2017-12-25 NOTE — Assessment & Plan Note (Signed)
I have reviewed most recent CT imaging with the patient His most recent CT scan right before Christmas time showed worsening disease control The patient has significant amount of pain We discussed discontinuation of current regimen and switching him to other modalities The patient would like to proceed with one last dose today because the treatment typically makes him feel better I suspect he felt better because of the IV pain medicine and IV steroids and unrelated to the perceived beneficial effect from Taxol Ultimately, he is undecided and wants to proceed with treatment as scheduled I would discontinue his treatment after today I will bring him back next week for further discussion about plan of care The patient has progressed on multiple regimen He certainly has not tried cisplatin-based regimen although I doubt he can tolerate it due to potential risk of uncontrolled nausea and vomiting Single agent methotrexate could be given but after expected benefit would be less than 20% The patient is not interested to pursue palliative care/hospice yet

## 2017-12-25 NOTE — Assessment & Plan Note (Signed)
We had extensive discussion about goals of care from previous visit The patient has verbalized the desire to remain DNR He is not able to make further discussion about stopping treatment today I will review goals of care again in his next visit

## 2017-12-25 NOTE — Assessment & Plan Note (Addendum)
He has severe, uncontrolled pain due to disease I proceed to refill his prescription pain medicine He is very angry because he felt that the trach is the cause of his pain I suspect is due to disease progression We will address pain management in his next visit Per patient request, I would proceed with IV Dilaudid today during infusion for better pain control during infusion

## 2017-12-25 NOTE — Assessment & Plan Note (Signed)
He complained of severe local pain related to tracheostomy site He is very angry with his ENT physician We discussed potential referral to see a different ENT specialist if needed but the patient would discuss further tracheostomy care with his ENT doctor first

## 2017-12-25 NOTE — Telephone Encounter (Signed)
Gave patient avs and calendar with appts per 1/14 los.  °

## 2017-12-25 NOTE — Assessment & Plan Note (Signed)
Based on recent imaging study, he had worsening disease control  on examination, the lymph node is not palpable

## 2017-12-25 NOTE — Patient Instructions (Signed)
[facility] Discharge Instructions for Patients Receiving Chemotherapy  Today you received the following chemotherapy agents: Paclitaxel (Taxol).  To help prevent nausea and vomiting after your treatment, we encourage you to take your nausea medication as prescribed. Received Aloxi during treatment-->Take Compazine (not Zofran) for next 3 days. If you develop nausea and vomiting that is not controlled by your nausea medication, call the clinic.   BELOW ARE SYMPTOMS THAT SHOULD BE REPORTED IMMEDIATELY:  *FEVER GREATER THAN 100.5 F  *CHILLS WITH OR WITHOUT FEVER  NAUSEA AND VOMITING THAT IS NOT CONTROLLED WITH YOUR NAUSEA MEDICATION  *UNUSUAL SHORTNESS OF BREATH  *UNUSUAL BRUISING OR BLEEDING  TENDERNESS IN MOUTH AND THROAT WITH OR WITHOUT PRESENCE OF ULCERS  *URINARY PROBLEMS  *BOWEL PROBLEMS  UNUSUAL RASH Items with * indicate a potential emergency and should be followed up as soon as possible.  Feel free to call the clinic should you have any questions or concerns. The clinic phone number is [phone].  Please show the [facility] at check-in to the Emergency Department and triage nurse.

## 2017-12-29 ENCOUNTER — Ambulatory Visit (INDEPENDENT_AMBULATORY_CARE_PROVIDER_SITE_OTHER): Payer: Self-pay | Admitting: Physician Assistant

## 2018-01-01 ENCOUNTER — Telehealth: Payer: Self-pay | Admitting: *Deleted

## 2018-01-01 ENCOUNTER — Ambulatory Visit: Payer: Self-pay | Admitting: Hematology and Oncology

## 2018-01-01 NOTE — Telephone Encounter (Signed)
Pt will come Wednesday @ 1000. Message to scheduler

## 2018-01-01 NOTE — Telephone Encounter (Signed)
Journey cannot come in today to see Dr Alvy Bimler. IS available any other day this week.

## 2018-01-01 NOTE — Telephone Encounter (Signed)
I have only a spot at 10 am on Wednesday morning Otherwise next week

## 2018-01-02 ENCOUNTER — Telehealth: Payer: Self-pay

## 2018-01-02 NOTE — Telephone Encounter (Signed)
Called and ask if he can come in at 815 tomorrow?  He will be here around 0815, appt time for tomorrow left as scheduled.

## 2018-01-03 ENCOUNTER — Inpatient Hospital Stay (HOSPITAL_BASED_OUTPATIENT_CLINIC_OR_DEPARTMENT_OTHER): Payer: Medicaid Other | Admitting: Hematology and Oncology

## 2018-01-03 ENCOUNTER — Telehealth: Payer: Self-pay | Admitting: Hematology and Oncology

## 2018-01-03 ENCOUNTER — Other Ambulatory Visit: Payer: Self-pay | Admitting: Hematology and Oncology

## 2018-01-03 ENCOUNTER — Encounter: Payer: Self-pay | Admitting: Hematology and Oncology

## 2018-01-03 VITALS — BP 128/90 | HR 106 | Temp 98.8°F | Resp 18 | Ht 67.5 in | Wt 162.2 lb

## 2018-01-03 DIAGNOSIS — Z5111 Encounter for antineoplastic chemotherapy: Secondary | ICD-10-CM | POA: Diagnosis not present

## 2018-01-03 DIAGNOSIS — C770 Secondary and unspecified malignant neoplasm of lymph nodes of head, face and neck: Secondary | ICD-10-CM | POA: Diagnosis not present

## 2018-01-03 DIAGNOSIS — Z66 Do not resuscitate: Secondary | ICD-10-CM | POA: Diagnosis not present

## 2018-01-03 DIAGNOSIS — C321 Malignant neoplasm of supraglottis: Secondary | ICD-10-CM

## 2018-01-03 DIAGNOSIS — E538 Deficiency of other specified B group vitamins: Secondary | ICD-10-CM

## 2018-01-03 DIAGNOSIS — Z930 Tracheostomy status: Secondary | ICD-10-CM | POA: Diagnosis not present

## 2018-01-03 DIAGNOSIS — G893 Neoplasm related pain (acute) (chronic): Secondary | ICD-10-CM | POA: Diagnosis not present

## 2018-01-03 MED ORDER — MORPHINE SULFATE 30 MG PO TABS
60.0000 mg | ORAL_TABLET | ORAL | 0 refills | Status: DC | PRN
Start: 2018-01-03 — End: 2018-01-18

## 2018-01-03 MED ORDER — DEXAMETHASONE 4 MG PO TABS: 4.0000 mg | ORAL_TABLET | Freq: Every day | ORAL | 1 refills | Status: DC

## 2018-01-03 MED ORDER — FOLIC ACID 1 MG PO TABS: 1.0000 mg | ORAL_TABLET | Freq: Every day | ORAL | 9 refills | Status: DC

## 2018-01-03 MED FILL — DEXAMETHASONE 4 MG TABLET: 4 | 30 days supply | Qty: 30 | Fill #0

## 2018-01-03 MED FILL — MORPHINE SULFATE IR 30 MG T: 30 | 8 days supply | Qty: 90 | Fill #0

## 2018-01-03 MED FILL — ONDANSETRON HCL 8 MG TAB: 8 | 20 days supply | Qty: 60 | Fill #2

## 2018-01-03 MED FILL — FOLIC ACID 1 MG TABLET: 1 | 30 days supply | Qty: 30 | Fill #0

## 2018-01-03 NOTE — Progress Notes (Signed)
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[place])   03/15/2015 - 03/17/2015 Hospital Admission    He was admitted to the hospital for evaluation of dysphagia, SOB, hemoptosis, hoarseness, 30-40 pound weight loss and worsening bilateral neck masses for 5 months      03/15/2015 Imaging    Ct showed extensive circumferential malignancy in the hypopharyngeal/supraglottic region with regional LN metastases      03/16/2015 Procedure    He underwent ULTRASOUND-GUIDED BIOPSY OF LEFT CERVICAL LYMPH NODES      03/16/2015 Pathology Results    Accession: [id number] LN biopsy showed invasive squamous cell cancer      03/16/2015 Pathology Results    Accession: [id number] showed atypical squamous cells      03/25/2015 - 04/07/2015 Hospital Admission    He was admitted to the hospital and underwent tracheostomy placement, feeeding tube placement but subsequently left [facility]      03/26/2015 Surgery    He had multiple extraction of tooth numbers 1, 2, 5, 6, 7, 8, 9, 10, 11, 12, 13, 18, 19, 21, 22, 23, 24, 25, 26, 27, 28, and 29. and 4 Quadrants of alveoloplasty      03/26/2015 Surgery    He underwent tracheostomy      03/31/2015 Surgery    He had open gastrostomy tube placement by Dr. Donne Hazel      04/16/2015 - 05/18/2015 Radiation Therapy    Laryngopharynx and bilateral neck / 50 Gy in 20 fractions to gross disease, 45 Gy in 20 fractions to high risk nodal echelons  Beams/energy: Helical IMRT / 6 MV photons      04/16/2015 Procedure    Fluoroscopic reposition of the 18 French gastrostomy confirmed back in the stomach,       07/03/2015 Procedure    IR performed replacement of gastrostomy tube with a new 90 French balloon retention tube      10/01/2015 Imaging    PEt scan showed persistent hypermetabolism within the primary supraglottic laryngeal tumor and within right retropharyngeal, bilateral level II and left level IV cervical nodal metastases      10/28/2015 Procedure    He had placement of PICC line. The IR was not able to place PORT due to suspected upper respiratory infection      11/13/2015 - 03/21/2016 Chemotherapy    He received 5FU, carboplatin chemo with weekly Erbitux      11/19/2015 Surgery    Gastrostomy tube replaced.      11/30/2015 Procedure    Placement of right jugular port-a-cath.      11/30/2015 Procedure    PICC removed.      12/30/2015 Imaging    PET CT showed positive response to Rx      02/26/2016 Procedure    He underwent direct laryngoscopy with biopsy. Esophageal dilatation.       02/26/2016 Pathology Results    Repeat biopsy of supraglottis showed persistent disease      02/29/2016 Procedure    Gastrostomy tube exchanged.      03/28/2016 PET scan    Hypermetabolic tissue in the posterior RIGHT hypopharynx is similar in pattern to PET-CT of 12/30/2015 but increased in metabolic activity.2. Hypermetabolic  tissue / lymph nodes in the LEFT supraclavicular nodal station are in a similar pattern       04/04/2016 - 11/29/2016 Chemotherapy    He started on palliative chemotherapy with pembrolizumab      05/03/2016 Imaging    MRI head is negative      06/02/2016 Imaging    Mild improvement of diffuse pharyngeal and supraglottic edema.Increased asymmetry of right-sided hypopharyngeal/supraglottic softtissue compared to the prior neck CT with FDG uptake in this region on prior PET-CT. Residual/recurrent tumor is possible      08/09/2016 Procedure    IR placed new 20 French percutaneous gastrostomy tube.      08/26/2016 Imaging    Ct neck showed unchanged diffuse  pharyngeal and supraglottic edema as well as asymmetric soft tissue thickening on the right. Slightly decreased size of some left level II lymph nodes. Unchanged lymphadenopathy elsewhere in the neck. Decreased size of thyroid mass. No evidence of metastatic cancer to the chest      12/13/2016 Imaging    Ct chest showed no evidence of thoracic metastatic disease or primary thoracic malignancy.      12/13/2016 Imaging    Ct neck showed progression of of RIGHT supraglottic mass compared with priors, approximate size 24 x 27 x 27 mm. Extension caudally along the RIGHT area epiglottic fold with increasing mass effect on the airway. Tracheostomy satisfactory position. Stable to slightly improved malignant adenopathy.      12/26/2016 - 12/25/2017 Chemotherapy    He received chemotherapy with modified schedule of reduced dose Taxol       02/10/2017 Imaging    Very similar appearance to the study of January. Right sided supraglottic to glottic mass is similar, perhaps a few mm smaller. Bilateral enlarged nodes appear the same. No progressive finding      02/10/2017 Imaging    No evidence of metastatic disease in the abdomen/pelvis. Percutaneous gastrostomy in satisfactory position. Cholelithiasis, without associated inflammatory changes.      06/06/2017 Imaging    CT neck: Supraglottic tumor on the right is mildly smaller now measuring 23 x 16 mm. Improvement in bilateral cervical adenopathy. No new adenopathy identified      09/06/2017 Imaging    1. Essentially stable post treatment CT appearance of the neck since June (see #3). 2. Residual right supraglottic soft tissue asymmetry and indistinct 16 mm right level II nodal tissue is superimposed on post treatment changes including diffuse pharyngeal mucosal space soft tissue thickening. 3. A 15 mm round low-density mass in the left thyroid region is slowly enlarging and favored to be a thyroid nodule rather than on necrotic lymph node. Attention on  follow-up Neck CT versus dedicated Thyroid Ultrasound recommended.      12/04/2017 Imaging    1. Interval enlargement of right level II and left level IV lymph nodes most consistent with progressive metastatic disease. 2. Further enlargement of posterior left thyroid region mass also concerning for progressive metastatic disease (either nodal or extranodal) rather than a benign thyroid nodule. 3. Stable to minimal increase of the residual right supraglottic masslike soft tissue.       INTERVAL HISTORY: Please see below for problem oriented charting. He returns to discuss plan of care due to recent disease progression noted on CT scan He continues to have severe cancer associated pain in his throat at the tracheostomy site He denies significant bleeding His pain is reasonably controlled with current prescription immediate release morphine sulfate He denies recent constipation or nausea  he denies significant peripheral neuropathy  REVIEW OF SYSTEMS:   Constitutional: Denies fevers, chills or abnormal weight loss Eyes: Denies blurriness of vision Ears, nose, mouth, throat, and face: Denies mucositis or sore throat Respiratory: Denies cough, dyspnea or wheezes Cardiovascular: Denies palpitation, chest discomfort or lower extremity swelling Gastrointestinal:  Denies nausea, heartburn or change in bowel habits Skin: Denies abnormal skin rashes Lymphatics: Denies new lymphadenopathy or easy bruising Neurological:Denies numbness, tingling or new weaknesses Behavioral/Psych: Mood is stable, no new changes  All other systems were reviewed with the patient and are negative.  I have reviewed the past medical history, past surgical history, social history and family history with the patient and they are unchanged from previous note.  ALLERGIES:  is allergic to aspirin.  MEDICATIONS:  Current Outpatient Medications  Medication Sig Dispense Refill  . amoxicillin (AMOXIL) 250 MG/5ML  suspension Place 10 mLs (500 mg total) into feeding tube 2 (two) times daily. For 7 days 140 mL 0  . dexamethasone (DECADRON) 4 MG tablet Take 1 tablet (4 mg total) by mouth daily. 30 tablet 1  . fentaNYL (DURAGESIC - DOSED MCG/HR) 25 MCG/HR patch Place 1 patch (25 mcg total) every 3 (three) days onto the skin. 5 patch 0  . fluticasone (FLONASE) 50 MCG/ACT nasal spray Place 2 sprays into both nostrils daily. 16 g 2  . folic acid (FOLVITE) 1 MG tablet Take 1 tablet (1 mg total) by mouth daily. 30 tablet 9  . gabapentin (NEURONTIN) 300 MG capsule Place 1 capsule (300 mg total) 3 (three) times daily into feeding tube. 90 capsule 11  . levothyroxine (SYNTHROID) 100 MCG tablet Take 1 tablet (100 mcg total) by mouth daily before breakfast. 30 tablet 9  . morphine (MS CONTIN) 100 MG 12 hr tablet Take 1 tablet (100 mg total) by mouth every 8 (eight) hours. 90 tablet 0  . morphine (MSIR) 30 MG tablet Take 2 tablets (60 mg total) by mouth every 4 (four) hours as needed for severe pain. 90 tablet 0  . morphine (ROXANOL) 20 MG/ML concentrated solution Place 1 mL (20 mg total) every 2 (two) hours as needed into feeding tube for severe pain. 240 mL 0  . Nutritional Supplements (FEEDING SUPPLEMENT, JEVITY 1.5 CAL/FIBER,) LIQD Give 1 can Osmolite 1.2 + 1 can of Jevity 1.5 QID via PEG with 60 cc free water before and after bolus. Flush with 240 cc free water BID between feedings. 948 mL   . omeprazole (PRILOSEC) 40 MG capsule Take 1 capsule (40 mg total) by mouth daily. 90 capsule 3  . ondansetron (ZOFRAN) 8 MG tablet TAKE 1 TABLET BY MOUTH EVERY 8 HOURS AS NEEDED FOR NAUSEA 60 tablet 3  . scopolamine (TRANSDERM-SCOP, 1.5 MG,) 1 MG/3DAYS Place 1 patch (1.5 mg total) onto the skin every 3 (three) days. 10 patch 12  . sodium chloride irrigation 0.9 % irrigation USE TO CLEAN AROUND TRACH AND PERFORM TRACH CARE ONCE DAILY AND AS NEEDED 1000 mL 11  . triamcinolone (NASACORT AQ) 55 MCG/ACT AERO nasal inhaler Place 2 sprays  into the nose daily. 1 Inhaler 12   Current Facility-Administered Medications  Medication Dose Route Frequency Provider Last Rate Last Dose  . guaiFENesin-dextromethorphan (ROBITUSSIN DM) 100-10 MG/5ML syrup 10 mL  10 mL Oral Q4H PRN Alvy Bimler, Keyleigh Manninen, MD   10 mL at 01/09/17 1427   Facility-Administered Medications Ordered in Other Visits  Medication Dose Route Frequency Provider Last Rate Last Dose  . 0.9 %  sodium chloride infusion  Intravenous Once Alvy Bimler, Jujhar Everett, MD      . alteplase (CATHFLO ACTIVASE) injection 2 mg  2 mg Intracatheter Once PRN Alvy Bimler, Araf Clugston, MD      . heparin lock flush 100 unit/mL  500 Units Intracatheter Once PRN Alvy Bimler, Deandre Brannan, MD      . heparin lock flush 100 unit/mL  250 Units Intracatheter Once PRN Alvy Bimler, Edith Groleau, MD      . ondansetron (ZOFRAN) injection 8 mg  8 mg Intravenous Once Drue Second R, NP      . sodium chloride 0.9 % injection 10 mL  10 mL Intracatheter PRN Alvy Bimler, Shellyann Wandrey, MD   10 mL at 01/02/17 1107  . sodium chloride 0.9 % injection 10 mL  10 mL Intracatheter PRN Alvy Bimler, Keion Neels, MD        PHYSICAL EXAMINATION: ECOG PERFORMANCE STATUS: 2 - Symptomatic, <50% confined to bed  Vitals:   01/03/18 0840  BP: 128/90  Pulse: (!) 106  Resp: 18  Temp: 98.8 F (37.1 C)  SpO2: 100%   Filed Weights   01/03/18 0840  Weight: 162 lb 3.2 oz (73.6 kg)    GENERAL:alert, no distress and comfortable SKIN: skin color, texture, turgor are normal, no rashes or significant lesions EYES: normal, Conjunctiva are pink and non-injected, sclera clear OROPHARYNX:no exudate, no erythema and lips, buccal mucosa, and tongue normal  NECK: Tracheostomy site does not look infected Musculoskeletal:no cyanosis of digits and no clubbing  NEURO: alert & oriented x 3 with fluent speech, no focal motor/sensory deficits  LABORATORY DATA:  I have reviewed the data as listed    Component Value Date/Time   NA 141 12/25/2017 1152   NA 139 12/11/2017 0910   K 3.6 12/25/2017 1152   K 3.6  12/11/2017 0910   CL 101 12/25/2017 1152   CO2 30 (H) 12/25/2017 1152   CO2 27 12/11/2017 0910   GLUCOSE 107 12/25/2017 1152   GLUCOSE 119 12/11/2017 0910   BUN 13 12/25/2017 1152   BUN 11.8 12/11/2017 0910   CREATININE 0.74 12/25/2017 1152   CREATININE 0.7 12/11/2017 0910   CALCIUM 9.7 12/25/2017 1152   CALCIUM 9.5 12/11/2017 0910   PROT 7.1 12/25/2017 1152   PROT 7.2 12/11/2017 0910   ALBUMIN 3.7 12/25/2017 1152   ALBUMIN 3.7 12/11/2017 0910   AST 12 12/25/2017 1152   AST 11 12/11/2017 0910   ALT 12 12/25/2017 1152   ALT 12 12/11/2017 0910   ALKPHOS 71 12/25/2017 1152   ALKPHOS 75 12/11/2017 0910   BILITOT 0.3 12/25/2017 1152   BILITOT 0.42 12/11/2017 0910   GFRNONAA >60 12/25/2017 1152   GFRAA >60 12/25/2017 1152    No results found for: SPEP, UPEP  Lab Results  Component Value Date   WBC 12.3 (H) 12/25/2017   NEUTROABS 10.8 (H) 12/25/2017   HGB 11.7 (L) 12/25/2017   HCT 35.4 (L) 12/25/2017   MCV 95.2 12/25/2017   PLT 216 12/25/2017      Chemistry      Component Value Date/Time   NA 141 12/25/2017 1152   NA 139 12/11/2017 0910   K 3.6 12/25/2017 1152   K 3.6 12/11/2017 0910   CL 101 12/25/2017 1152   CO2 30 (H) 12/25/2017 1152   CO2 27 12/11/2017 0910   BUN 13 12/25/2017 1152   BUN 11.8 12/11/2017 0910   CREATININE 0.74 12/25/2017 1152   CREATININE 0.7 12/11/2017 0910      Component Value Date/Time   CALCIUM 9.7 12/25/2017 1152  CALCIUM 9.5 12/11/2017 0910   ALKPHOS 71 12/25/2017 1152   ALKPHOS 75 12/11/2017 0910   AST 12 12/25/2017 1152   AST 11 12/11/2017 0910   ALT 12 12/25/2017 1152   ALT 12 12/11/2017 0910   BILITOT 0.3 12/25/2017 1152   BILITOT 0.42 12/11/2017 0910       RADIOGRAPHIC STUDIES: I have personally reviewed the radiological images as listed and agreed with the findings in the report. Ct Soft Tissue Neck W Contrast  Result Date: 12/04/2017 CLINICAL DATA:  History of right supraglottic squamous cell carcinoma. Completed  radiation therapy. Continuing chemotherapy. EXAM: CT NECK WITH CONTRAST TECHNIQUE: Multidetector CT imaging of the neck was performed using the standard protocol following the bolus administration of intravenous contrast. CONTRAST:  59mL ISOVUE-300 IOPAMIDOL (ISOVUE-300) INJECTION 61% COMPARISON:  09/06/2017 FINDINGS: Pharynx and larynx: Generalized pharyngeal and laryngeal mucosal edema is similar to the prior study and consistent with changes of radiation therapy. Asymmetric right supraglottic laryngeal soft tissue is stable to minimally more prominent than on the prior study. This soft tissue remains relatively hypoattenuating without a hyperenhancing mass identified. A tracheostomy tube remains in place. Salivary glands: Unchanged parotid and submandibular glands without evidence of mass, calculi, or ductal dilatation. Thyroid: Hypodense mass in the posterior left thyroid region has further enlarged, measuring approximately 1.9 x 1.9 cm and with some mass effect on the trachea. Lymph nodes: Interval enlargement of a necrotic right level IIa lymph node, now measuring 2.5 x 2.1 cm (previously 1.6 x 1.4 cm). Ill-defined hypodense soft tissue in the left neck at the level IV region has enlarged from the prior study, difficult to measure due to its poorly defined margins and streak artifact through this region though estimated at 1.7 x 1.7 cm (series 203, image 104). Fat stranding and obscured fat planes in the neck bilaterally are noted related to prior radiation therapy. Vascular: Mild calcified atherosclerosis at the carotid bifurcations. Major vascular structures of the neck are grossly patent. Partially visualized right internal jugular central venous catheter. Limited intracranial: Unremarkable. Visualized orbits: Unremarkable. Mastoids and visualized paranasal sinuses: Minimal bilateral maxillary sinus mucosal thickening. No fluid. Clear mastoid air cells. Skeleton: No acute osseous abnormality or suspicious  osseous lesion. Upper chest: Mild scarring/ fibrosis in the lung apices. Other: None. IMPRESSION: 1. Interval enlargement of right level II and left level IV lymph nodes most consistent with progressive metastatic disease. 2. Further enlargement of posterior left thyroid region mass also concerning for progressive metastatic disease (either nodal or extranodal) rather than a benign thyroid nodule. 3. Stable to minimal increase of the residual right supraglottic masslike soft tissue. Electronically Signed   By: Logan Bores M.D.   On: 12/04/2017 11:47   Dg Esophagus  Result Date: 12/04/2017 CLINICAL DATA:  Dysphagia with need for tracheostomy and nasal suction after swallowing. 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 Tracheostomy. EXAM: ESOPHOGRAM/BARIUM SWALLOW TECHNIQUE: Single contrast examination was performed using  thin barium. FLUOROSCOPY TIME:  Fluoroscopy Time: 1 minutes and 36 seconds of low-dose pulsed fluoro. Radiation Exposure Index (if provided by the fluoroscopic device): 29 mGy Number of Acquired Spot Images: 0 COMPARISON:  Esophagram 02/15/2016.  CT neck 12/04/2017. FINDINGS: The patient swallowed the barium without difficulty. Again demonstrated is postsurgical distortion of the pharynx with irregularity of the posterior wall of the cervical esophagus. Nasopharyngeal reflux was again noted. There is also silent aspiration of barium into the proximal trachea. Patient has a tracheostomy. Barium did not pass inferior to  the tracheostomy. The thoracic esophagus appears normal with normal motility and no mucosal abnormality. There is no significant hiatal hernia. Patient reports inability to take whole pills, and no barium pill administered. Patient did require nasal and tracheostomy suction during the examination. Reports this is standard whenever he drinks liquids. IMPRESSION: 1. Silent aspiration of barium. Barium did not pass inferior to the tracheostomy.  2. Diffuse distortion of the pharynx from previous surgery and radiation therapy. There is irregularity of the posterior wall of the cervical esophagus which is nonspecific. Refer to neck CT report same date. 3. No abnormality of the thoracic esophagus seen. Electronically Signed   By: Richardean Sale M.D.   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HCC) The patient has progressed on multiple lines of chemotherapy As long as he is alive, he does not want to stop chemotherapy, even though expected response rate is small with significant risks of side effects for future treatment We discussed the risk of benefit of trial of cisplatin versus weekly methotrexate I am doubtful that he would be able to tolerate cisplatin due to profound risk of nausea & vomiting in the past I told him, with risks of severe nausea and vomiting, he would be at significant risk of chemotherapy induced renal failure With his incurable metastatic cancer, he would not be a candidate for long-term hemodialysis Ultimately, he agrees not to pursue cisplatin. He is interested to try but weekly methotrexate I plan to give him weekly treatment on days 1, 8, 15 and rest day 22 We will check serum vitamin B12 in his next blood draw I will start him on high-dose folic acid daily I will see him back prior to week 2 of therapy  Cancer associated pain He has poorly controlled cancer pain He does not like to take long-acting morphine sulfate I refilled his prescription immediate release morphine He felt it is beneficial for him to get intravenous steroids I will start him on daily dose of 4 mg dexamethasone and reassess pain control in his next visit  DNR (do not resuscitate) We had extensive discussion about goals of care again today The patient has verbalized the desire to remain DNR He is not interested to stop chemotherapy, despite expected response rate of less than 20%   Orders  Placed This Encounter  Procedures  . CBC with Differential (Cancer Center Only)    Standing Status:   Standing    Number of Occurrences:   20    Standing Expiration Date:   01/03/2019  . CMP ([facility] only)    Standing Status:   Standing    Number of Occurrences:   20    Standing Expiration Date:   01/03/2019   All questions were answered. The patient knows to call the clinic with any problems, questions or concerns. No barriers to learning was detected. I spent 25 minutes counseling the patient face to face. The total time spent in the appointment was 40 minutes and more than 50% was on counseling and review of test results     [name], MD 01/03/2018 9:53 AM

## 2018-01-03 NOTE — Assessment & Plan Note (Signed)
He has poorly controlled cancer pain He does not like to take long-acting morphine sulfate I refilled his prescription immediate release morphine He felt it is beneficial for him to get intravenous steroids I will start him on daily dose of 4 mg dexamethasone and reassess pain control in his next visit

## 2018-01-03 NOTE — Assessment & Plan Note (Signed)
The patient has progressed on multiple lines of chemotherapy As long as he is alive, he does not want to stop chemotherapy, even though expected response rate is small with significant risks of side effects for future treatment We discussed the risk of benefit of trial of cisplatin versus weekly methotrexate I am doubtful that he would be able to tolerate cisplatin due to profound risk of nausea & vomiting in the past I told him, with risks of severe nausea and vomiting, he would be at significant risk of chemotherapy induced renal failure With his incurable metastatic cancer, he would not be a candidate for long-term hemodialysis Ultimately, he agrees not to pursue cisplatin. He is interested to try but weekly methotrexate I plan to give him weekly treatment on days 1, 8, 15 and rest day 22 We will check serum vitamin B12 in his next blood draw I will start him on high-dose folic acid daily I will see him back prior to week 2 of therapy

## 2018-01-03 NOTE — Progress Notes (Signed)
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

## 2018-01-03 NOTE — Telephone Encounter (Signed)
Gave patient avs and calendar with appts per 1/23 los.

## 2018-01-03 NOTE — Assessment & Plan Note (Signed)
We had extensive discussion about goals of care again today The patient has verbalized the desire to remain DNR He is not interested to stop chemotherapy, despite expected response rate of less than 20%

## 2018-01-05 MED FILL — LEVOTHYROXINE 100 MCG TAB: 100 | 30 days supply | Qty: 30 | Fill #0

## 2018-01-09 ENCOUNTER — Other Ambulatory Visit: Payer: Self-pay | Admitting: Hematology and Oncology

## 2018-01-09 DIAGNOSIS — C321 Malignant neoplasm of supraglottis: Secondary | ICD-10-CM

## 2018-01-09 DIAGNOSIS — E039 Hypothyroidism, unspecified: Secondary | ICD-10-CM

## 2018-01-10 ENCOUNTER — Telehealth: Payer: Self-pay | Admitting: *Deleted

## 2018-01-10 ENCOUNTER — Inpatient Hospital Stay: Payer: Medicaid Other

## 2018-01-10 VITALS — BP 138/87 | HR 96 | Temp 98.6°F | Resp 18

## 2018-01-10 DIAGNOSIS — E039 Hypothyroidism, unspecified: Secondary | ICD-10-CM

## 2018-01-10 DIAGNOSIS — E538 Deficiency of other specified B group vitamins: Secondary | ICD-10-CM

## 2018-01-10 DIAGNOSIS — C321 Malignant neoplasm of supraglottis: Secondary | ICD-10-CM

## 2018-01-10 DIAGNOSIS — Z5111 Encounter for antineoplastic chemotherapy: Secondary | ICD-10-CM | POA: Diagnosis not present

## 2018-01-10 LAB — CMP (CANCER CENTER ONLY)
ALBUMIN: 3.7 g/dL (ref 3.5–5.0)
ALT: 10 U/L (ref 0–55)
AST: 10 U/L (ref 5–34)
Alkaline Phosphatase: 70 U/L (ref 40–150)
Anion gap: 9 (ref 3–11)
BILIRUBIN TOTAL: 0.2 mg/dL (ref 0.2–1.2)
BUN: 18 mg/dL (ref 7–26)
CHLORIDE: 100 mmol/L (ref 98–109)
CO2: 31 mmol/L — ABNORMAL HIGH (ref 22–29)
CREATININE: 0.73 mg/dL (ref 0.70–1.30)
Calcium: 9.5 mg/dL (ref 8.4–10.4)
GFR, Est AFR Am: >60 mL/min (ref >60)
GLUCOSE: 104 mg/dL (ref 70–140)
POTASSIUM: 3.4 mmol/L — AB (ref 3.5–5.1)
Sodium: 140 mmol/L (ref 136–145)
TOTAL PROTEIN: 6.9 g/dL (ref 6.4–8.3)

## 2018-01-10 LAB — CBC WITH DIFFERENTIAL (CANCER CENTER ONLY)
BASOS ABS: 0 10*3/uL (ref 0.0–0.1)
BASOS PCT: 0 %
Eosinophils Absolute: 0 10*3/uL (ref 0.0–0.5)
Eosinophils Relative: 0 %
HEMATOCRIT: 34.8 % — AB (ref 38.4–49.9)
Hemoglobin: 11.5 g/dL — ABNORMAL LOW (ref 13.0–17.1)
LYMPHS PCT: 3 %
Lymphs Abs: 0.4 10*3/uL — ABNORMAL LOW (ref 0.9–3.3)
MCH: 30.8 pg (ref 27.2–33.4)
MCHC: 33.1 g/dL (ref 32.0–36.0)
MCV: 93 fL (ref 79.3–98.0)
MONO ABS: 1.1 10*3/uL — AB (ref 0.1–0.9)
Monocytes Relative: 8 %
NEUTROS ABS: 11.5 10*3/uL — AB (ref 1.5–6.5)
NEUTROS PCT: 89 %
Platelet Count: 241 10*3/uL (ref 140–400)
RBC: 3.74 MIL/uL — AB (ref 4.20–5.82)
RDW: 16.1 % — AB (ref 11.0–14.6)
WBC: 13.1 10*3/uL — AB (ref 4.0–10.3)

## 2018-01-10 LAB — TSH: TSH: 7.925 u[IU]/mL — AB (ref 0.320–4.118)

## 2018-01-10 LAB — VITAMIN B12: VITAMIN B 12: 847 pg/mL (ref 180–914)

## 2018-01-10 MED ORDER — HYDROMORPHONE HCL 2 MG/ML IJ SOLN
INTRAMUSCULAR | Status: AC
  Filled 2018-01-10: qty 1

## 2018-01-10 MED ORDER — HYDROMORPHONE HCL 2 MG/ML IJ SOLN
2.0000 mg | Freq: Once | INTRAMUSCULAR | Status: AC
  Administered 2018-01-10: 2 mg via INTRAVENOUS

## 2018-01-10 MED ORDER — PROCHLORPERAZINE MALEATE 10 MG PO TABS
ORAL_TABLET | ORAL | Status: AC
  Filled 2018-01-10: qty 1

## 2018-01-10 MED ORDER — SODIUM CHLORIDE 0.9 % IJ SOLN
10.0000 mL | INTRAMUSCULAR | Status: DC | PRN
  Administered 2018-01-10: 10 mL
  Filled 2018-01-10: qty 10

## 2018-01-10 MED ORDER — HEPARIN SOD (PORK) LOCK FLUSH 100 UNIT/ML IV SOLN
500.0000 [IU] | Freq: Once | INTRAVENOUS | Status: AC | PRN
  Administered 2018-01-10: 500 [IU]
  Filled 2018-01-10: qty 5

## 2018-01-10 MED ORDER — PROCHLORPERAZINE EDISYLATE 5 MG/ML IJ SOLN
10.0000 mg | Freq: Once | INTRAMUSCULAR | Status: AC
  Administered 2018-01-10: 10 mg via INTRAVENOUS

## 2018-01-10 MED ORDER — PROCHLORPERAZINE MALEATE 10 MG PO TABS: 10.0000 mg | ORAL_TABLET | Freq: Once | ORAL | Status: DC

## 2018-01-10 MED ORDER — PROCHLORPERAZINE EDISYLATE 5 MG/ML IJ SOLN
INTRAMUSCULAR | Status: AC
  Filled 2018-01-10: qty 2

## 2018-01-10 MED ORDER — SODIUM CHLORIDE 0.9% FLUSH
10.0000 mL | Freq: Once | INTRAVENOUS | Status: AC
  Administered 2018-01-10: 10 mL
  Filled 2018-01-10: qty 10

## 2018-01-10 MED ORDER — METHOTREXATE SODIUM (PF) CHEMO INJECTION 250 MG/10ML
75.0000 mg | Freq: Once | INTRAMUSCULAR | Status: AC
  Administered 2018-01-10: 75 mg via INTRAVENOUS
  Filled 2018-01-10: qty 3

## 2018-01-10 NOTE — Patient Instructions (Addendum)
[facility] Discharge Instructions for Patients Receiving Chemotherapy  Today you received the following chemotherapy agents methotrexate  To help prevent nausea and vomiting after your treatment, we encourage you to take your nausea medication as directed  If you develop nausea and vomiting that is not controlled by your nausea medication, call the clinic.   BELOW ARE SYMPTOMS THAT SHOULD BE REPORTED IMMEDIATELY:  *FEVER GREATER THAN 100.5 F  *CHILLS WITH OR WITHOUT FEVER  NAUSEA AND VOMITING THAT IS NOT CONTROLLED WITH YOUR NAUSEA MEDICATION  *UNUSUAL SHORTNESS OF BREATH  *UNUSUAL BRUISING OR BLEEDING  TENDERNESS IN MOUTH AND THROAT WITH OR WITHOUT PRESENCE OF ULCERS  *URINARY PROBLEMS  *BOWEL PROBLEMS  UNUSUAL RASH Items with * indicate a potential emergency and should be followed up as soon as possible.  Feel free to call the clinic you have any questions or concerns. The clinic phone number is [phone].   Methotrexate injection What is this medicine? METHOTREXATE (METH oh TREX ate) is a chemotherapy drug used to treat cancer including breast cancer, leukemia, and lymphoma. This medicine can also be used to treat psoriasis and certain kinds of arthritis. This medicine may be used for other purposes; ask your health care provider or pharmacist if you have questions. What should I tell my health care provider before I take this medicine? They need to know if you have any of these conditions: -fluid in the stomach area or lungs -if you often drink alcohol -infection or immune system problems -kidney disease -liver disease -low blood counts, like low white cell, platelet, or red cell counts -lung disease -radiation therapy -stomach ulcers -ulcerative colitis -an unusual or allergic reaction to methotrexate, other medicines, foods, dyes, or preservatives -pregnant or trying to get pregnant -breast-feeding How should I use this medicine? This  medicine is for infusion into a vein or for injection into muscle or into the spinal fluid (whichever applies). It is usually given by a health care professional in a hospital or clinic setting. In rare cases, you might get this medicine at home. You will be taught how to give this medicine. Use exactly as directed. Take your medicine at regular intervals. Do not take your medicine more often than directed. If this medicine is used for arthritis or psoriasis, it should be taken weekly, NOT daily. It is important that you put your used needles and syringes in a special sharps container. Do not put them in a trash can. If you do not have a sharps container, call your pharmacist or healthcare provider to get one. Talk to your pediatrician regarding the use of this medicine in children. While this drug may be prescribed for children as young as 2 years for selected conditions, precautions do apply. Overdosage: If you think you have taken too much of this medicine contact a poison control center or emergency room at once. NOTE: This medicine is only for you. Do not share this medicine with others. What if I miss a dose? It is important not to miss your dose. Call your doctor or health care professional if you are unable to keep an appointment. If you give yourself the medicine and you miss a dose, talk with your doctor or health care professional. Do not take double or extra doses. What may interact with this medicine? This medicine may interact with the following medications: -acitretin -aspirin or aspirin-like medicines including salicylates -azathioprine -certain antibiotics like chloramphenicol, penicillin, tetracycline -certain medicines for stomach problems like esomeprazole, omeprazole, pantoprazole -cyclosporine -  gold -hydroxychloroquine -live virus vaccines -mercaptopurine -NSAIDs, medicines for pain and inflammation, like ibuprofen or naproxen -other cytotoxic  agents -penicillamine -phenylbutazone -phenytoin -probenacid -retinoids such as isotretinoin and tretinoin -steroid medicines like prednisone or cortisone -sulfonamides like sulfasalazine and trimethoprim/sulfamethoxazole -theophylline This list may not describe all possible interactions. Give your health care provider a list of all the medicines, herbs, non-prescription drugs, or dietary supplements you use. Also tell them if you smoke, drink alcohol, or use illegal drugs. Some items may interact with your medicine. What should I watch for while using this medicine? Avoid alcoholic drinks. In some cases, you may be given additional medicines to help with side effects. Follow all directions for their use. This medicine can make you more sensitive to the sun. Keep out of the sun. If you cannot avoid being in the sun, wear protective clothing and use sunscreen. Do not use sun lamps or tanning beds/booths. You may get drowsy or dizzy. Do not drive, use machinery, or do anything that needs mental alertness until you know how this medicine affects you. Do not stand or sit up quickly, especially if you are an older patient. This reduces the risk of dizzy or fainting spells. You may need blood work done while you are taking this medicine. Call your doctor or health care professional for advice if you get a fever, chills or sore throat, or other symptoms of a cold or flu. Do not treat yourself. This drug decreases your body's ability to fight infections. Try to avoid being around people who are sick. This medicine may increase your risk to bruise or bleed. Call your doctor or health care professional if you notice any unusual bleeding. Check with your doctor or health care professional if you get an attack of severe diarrhea, nausea and vomiting, or if you sweat a lot. The loss of too much body fluid can make it dangerous for you to take this medicine. Talk to your doctor about your risk of cancer. You  may be more at risk for certain types of cancers if you take this medicine. Both men and women must use effective birth control with this medicine. Do not become pregnant while taking this medicine or until at least 1 normal menstrual cycle has occurred after stopping it. Women should inform their doctor if they wish to become pregnant or think they might be pregnant. Men should not father a child while taking this medicine and for 3 months after stopping it. There is a potential for serious side effects to an unborn child. Talk to your health care professional or pharmacist for more information. Do not breast-feed an infant while taking this medicine. What side effects may I notice from receiving this medicine? Side effects that you should report to your doctor or health care professional as soon as possible: -allergic reactions like skin rash, itching or hives, swelling of the face, lips, or tongue -back pain -breathing problems or shortness of breath -confusion -diarrhea -dry, nonproductive cough -low blood counts - this medicine may decrease the number of white blood cells, red blood cells and platelets. You may be at increased risk of infections and bleeding -mouth sores -redness, blistering, peeling or loosening of the skin, including inside the mouth -seizures -severe headaches -signs of infection - fever or chills, cough, sore throat, pain or difficulty passing urine -signs and symptoms of bleeding such as bloody or black, tarry stools; red or dark-brown urine; spitting up blood or brown material that looks like coffee grounds; red  spots on the skin; unusual bruising or bleeding from the eye, gums, or nose -signs and symptoms of kidney injury like trouble passing urine or change in the amount of urine -signs and symptoms of liver injury like dark yellow or brown urine; general ill feeling or flu-like symptoms; light-colored stools; loss of appetite; nausea; right upper belly pain;  unusually weak or tired; yellowing of the eyes or skin -stiff neck -vomiting Side effects that usually do not require medical attention (report to your doctor or health care professional if they continue or are bothersome): -dizziness -hair loss -headache -stomach pain -upset stomach This list may not describe all possible side effects. Call your doctor for medical advice about side effects. You may report side effects to FDA at 1-800-FDA-1088. Where should I keep my medicine? If you are using this medicine at home, you will be instructed on how to store this medicine. Throw away any unused medicine after the expiration date on the label. NOTE: This sheet is a summary. It may not cover all possible information. If you have questions about this medicine, talk to your doctor, pharmacist, or health care provider.  2018 [employer] Standard (2015-03-19 12:36:41)

## 2018-01-10 NOTE — Telephone Encounter (Signed)
Notified of message below. Will crush and put in feeding tube

## 2018-01-10 NOTE — Telephone Encounter (Signed)
-----   Message from [name], MD sent at 01/10/2018 12:19 PM EST ----- Regarding: abnormal TSH TSH is high. He needs to take his thyroid medications consistently ----- Message ----- From: Interface, Lab In [place] Sent: 01/10/2018   9:27 AM To: [name], MD

## 2018-01-10 NOTE — Telephone Encounter (Signed)
Oncology Nurse Navigator Documentation  Returned VMM to pt's sister.   She indicated he had shared with her recently status of his prognosis.  She expressed concern he continues to live by himself, continues to refuse her involvement in his care.  She acknowledged despite their loving but contentious relationship, she doesn't want him to exclude family as his condition declines.  I encouraged her to continue to reach out to him and be available to him as he permits.  She voiced understanding.  Gayleen Orem, RN, BSN Head & Neck Oncology Nurse [facility] [place] [phone]

## 2018-01-11 ENCOUNTER — Telehealth: Payer: Self-pay

## 2018-01-11 NOTE — Telephone Encounter (Signed)
-----   Message from Arty Baumgartner, RN sent at 01/10/2018 10:42 AM EST ----- Regarding: first time/gorsuch  First time methotrexate/tolerated well.

## 2018-01-11 NOTE — Telephone Encounter (Signed)
Called for follow up, after new treatment yesterday. He is okay with no complaints at this time. Instructed to call for questions or concerns.

## 2018-01-17 ENCOUNTER — Other Ambulatory Visit: Payer: Self-pay

## 2018-01-17 DIAGNOSIS — E039 Hypothyroidism, unspecified: Secondary | ICD-10-CM

## 2018-01-18 ENCOUNTER — Inpatient Hospital Stay: Payer: Medicaid Other

## 2018-01-18 ENCOUNTER — Inpatient Hospital Stay: Payer: Medicaid Other | Attending: Hematology and Oncology | Admitting: Hematology and Oncology

## 2018-01-18 ENCOUNTER — Telehealth: Payer: Self-pay

## 2018-01-18 ENCOUNTER — Telehealth: Payer: Self-pay | Admitting: Hematology and Oncology

## 2018-01-18 ENCOUNTER — Encounter: Payer: Self-pay | Admitting: Hematology and Oncology

## 2018-01-18 VITALS — BP 130/78 | HR 80 | Temp 98.4°F | Resp 18 | Ht 67.5 in | Wt 163.6 lb

## 2018-01-18 DIAGNOSIS — E039 Hypothyroidism, unspecified: Secondary | ICD-10-CM | POA: Diagnosis not present

## 2018-01-18 DIAGNOSIS — C321 Malignant neoplasm of supraglottis: Secondary | ICD-10-CM

## 2018-01-18 DIAGNOSIS — C770 Secondary and unspecified malignant neoplasm of lymph nodes of head, face and neck: Secondary | ICD-10-CM | POA: Diagnosis not present

## 2018-01-18 DIAGNOSIS — E038 Other specified hypothyroidism: Secondary | ICD-10-CM | POA: Diagnosis not present

## 2018-01-18 DIAGNOSIS — K5909 Other constipation: Secondary | ICD-10-CM | POA: Insufficient documentation

## 2018-01-18 DIAGNOSIS — Z5111 Encounter for antineoplastic chemotherapy: Secondary | ICD-10-CM | POA: Diagnosis not present

## 2018-01-18 DIAGNOSIS — Z430 Encounter for attention to tracheostomy: Secondary | ICD-10-CM | POA: Diagnosis not present

## 2018-01-18 DIAGNOSIS — G893 Neoplasm related pain (acute) (chronic): Secondary | ICD-10-CM | POA: Insufficient documentation

## 2018-01-18 LAB — COMPREHENSIVE METABOLIC PANEL
ALK PHOS: 113 U/L (ref 40–150)
ALT: 28 U/L (ref 0–55)
ANION GAP: 7 (ref 3–11)
AST: 11 U/L (ref 5–34)
Albumin: 3.5 g/dL (ref 3.5–5.0)
BUN: 19 mg/dL (ref 7–26)
CALCIUM: 9.5 mg/dL (ref 8.4–10.4)
CO2: 32 mmol/L — ABNORMAL HIGH (ref 22–29)
Chloride: 99 mmol/L (ref 98–109)
Creatinine, Ser: 0.74 mg/dL (ref 0.70–1.30)
GLUCOSE: 135 mg/dL (ref 70–140)
POTASSIUM: 3.8 mmol/L (ref 3.5–5.1)
Sodium: 138 mmol/L (ref 136–145)
TOTAL PROTEIN: 6.8 g/dL (ref 6.4–8.3)

## 2018-01-18 LAB — CBC WITH DIFFERENTIAL/PLATELET
BASOS ABS: 0.1 10*3/uL (ref 0.0–0.1)
BASOS PCT: 0 %
Eosinophils Absolute: 0 10*3/uL (ref 0.0–0.5)
Eosinophils Relative: 0 %
HEMATOCRIT: 35.3 % — AB (ref 38.4–49.9)
Hemoglobin: 11.8 g/dL — ABNORMAL LOW (ref 13.0–17.1)
LYMPHS PCT: 1 %
Lymphs Abs: 0.2 10*3/uL — ABNORMAL LOW (ref 0.9–3.3)
MCH: 30.9 pg (ref 27.2–33.4)
MCHC: 33.3 g/dL (ref 32.0–36.0)
MCV: 92.8 fL (ref 79.3–98.0)
MONO ABS: 0.5 10*3/uL (ref 0.1–0.9)
Monocytes Relative: 2 %
Neutro Abs: 19.1 10*3/uL — ABNORMAL HIGH (ref 1.5–6.5)
Neutrophils Relative %: 97 %
PLATELETS: 229 10*3/uL (ref 140–400)
RBC: 3.81 MIL/uL — AB (ref 4.20–5.82)
RDW: 16.3 % — AB (ref 11.0–14.6)
WBC: 19.9 10*3/uL — AB (ref 4.0–10.3)

## 2018-01-18 LAB — TSH: TSH: 1.335 u[IU]/mL (ref 0.320–4.118)

## 2018-01-18 MED ORDER — PROCHLORPERAZINE MALEATE 10 MG PO TABS
ORAL_TABLET | ORAL | Status: AC
  Filled 2018-01-18: qty 1

## 2018-01-18 MED ORDER — PROCHLORPERAZINE EDISYLATE 5 MG/ML IJ SOLN
INTRAMUSCULAR | Status: AC
  Filled 2018-01-18: qty 2

## 2018-01-18 MED ORDER — PROCHLORPERAZINE EDISYLATE 5 MG/ML IJ SOLN
10.0000 mg | Freq: Once | INTRAMUSCULAR | Status: AC
  Administered 2018-01-18: 10 mg via INTRAVENOUS

## 2018-01-18 MED ORDER — MORPHINE SULFATE 30 MG PO TABS: 90.0000 mg | ORAL_TABLET | ORAL | 0 refills | Status: DC | PRN

## 2018-01-18 MED ORDER — METHOTREXATE SODIUM (PF) CHEMO INJECTION 250 MG/10ML
40.1250 mg/m2 | Freq: Once | INTRAMUSCULAR | Status: AC
  Administered 2018-01-18: 75 mg via INTRAVENOUS
  Filled 2018-01-18: qty 3

## 2018-01-18 MED ORDER — SODIUM CHLORIDE 0.9% FLUSH
10.0000 mL | Freq: Once | INTRAVENOUS | Status: AC
  Administered 2018-01-18: 10 mL
  Filled 2018-01-18: qty 10

## 2018-01-18 MED ORDER — PROCHLORPERAZINE MALEATE 10 MG PO TABS: 10.0000 mg | ORAL_TABLET | Freq: Once | ORAL | Status: DC

## 2018-01-18 MED ORDER — MORPHINE SULFATE ER 100 MG PO TBCR: 100.0000 mg | EXTENDED_RELEASE_TABLET | Freq: Three times a day (TID) | ORAL | 0 refills | Status: AC

## 2018-01-18 MED FILL — MORPHINE SULFATE IR 30 MG T: 30 | 5 days supply | Qty: 90 | Fill #0

## 2018-01-18 MED FILL — MORPHINE SULF ER 100 MG TAB: 100 | 30 days supply | Qty: 90 | Fill #0

## 2018-01-18 NOTE — Telephone Encounter (Signed)
Spoke with pt by phone informing him of TSH level and to continue taking same dose of levothyroxine per Dr Alvy Bimler.  Pt verbalizes understanding.

## 2018-01-18 NOTE — Assessment & Plan Note (Signed)
He will continue local care of tracheostomy site through his ENT doctor

## 2018-01-18 NOTE — Telephone Encounter (Signed)
Gave patient avs and calendar with appts per 2/7 los.  °

## 2018-01-18 NOTE — Assessment & Plan Note (Signed)
Likely due to pain medicine I recommend aggressive laxative therapy

## 2018-01-18 NOTE — Assessment & Plan Note (Signed)
So far, he tolerated methotrexate treatment well The addition of dexamethasone had make significant difference in helping with the throat swelling and pain I recommend we will continue weekly treatment x3, then rest 1 week I will see him back in 2 weeks for supportive care I recommend minimum 3 months of treatment before repeat imaging

## 2018-01-18 NOTE — Assessment & Plan Note (Signed)
The dexamethasone has helped He requested increased dose of immediate release morphine I have printed him multiple prescriptions I warned him about risk of sedation, nausea and constipation.

## 2018-01-18 NOTE — Patient Instructions (Signed)
Implanted Port Home Guide An implanted port is a type of central line that is placed under the skin. Central lines are used to provide IV access when treatment or nutrition needs to be given through a person's veins. Implanted ports are used for long-term IV access. An implanted port may be placed because:  You need IV medicine that would be irritating to the small veins in your hands or arms.  You need long-term IV medicines, such as antibiotics.  You need IV nutrition for a long period.  You need frequent blood draws for lab tests.  You need dialysis.  Implanted ports are usually placed in the chest area, but they can also be placed in the upper arm, the abdomen, or the leg. An implanted port has two main parts:  Reservoir. The reservoir is round and will appear as a small, raised area under your skin. The reservoir is the part where a needle is inserted to give medicines or draw blood.  Catheter. The catheter is a thin, flexible tube that extends from the reservoir. The catheter is placed into a large vein. Medicine that is inserted into the reservoir goes into the catheter and then into the vein.  How will I care for my incision site? Do not get the incision site wet. Bathe or shower as directed by your health care provider. How is my port accessed? Special steps must be taken to access the port:  Before the port is accessed, a numbing cream can be placed on the skin. This helps numb the skin over the port site.  Your health care provider uses a sterile technique to access the port. ? Your health care provider must put on a mask and sterile gloves. ? The skin over your port is cleaned carefully with an antiseptic and allowed to dry. ? The port is gently pinched between sterile gloves, and a needle is inserted into the port.  Only "non-coring" port needles should be used to access the port. Once the port is accessed, a blood return should be checked. This helps ensure that the port  is in the vein and is not clogged.  If your port needs to remain accessed for a constant infusion, a clear (transparent) bandage will be placed over the needle site. The bandage and needle will need to be changed every week, or as directed by your health care provider.  Keep the bandage covering the needle clean and dry. Do not get it wet. Follow your health care provider's instructions on how to take a shower or bath while the port is accessed.  If your port does not need to stay accessed, no bandage is needed over the port.  What is flushing? Flushing helps keep the port from getting clogged. Follow your health care provider's instructions on how and when to flush the port. Ports are usually flushed with saline solution or a medicine called heparin. The need for flushing will depend on how the port is used.  If the port is used for intermittent medicines or blood draws, the port will need to be flushed: ? After medicines have been given. ? After blood has been drawn. ? As part of routine maintenance.  If a constant infusion is running, the port may not need to be flushed.  How long will my port stay implanted? The port can stay in for as long as your health care provider thinks it is needed. When it is time for the port to come out, surgery will be   done to remove it. The procedure is similar to the one performed when the port was put in. When should I seek immediate medical care? When you have an implanted port, you should seek immediate medical care if:  You notice a bad smell coming from the incision site.  You have swelling, redness, or drainage at the incision site.  You have more swelling or pain at the port site or the surrounding area.  You have a fever that is not controlled with medicine.  This information is not intended to replace advice given to you by your health care provider. Make sure you discuss any questions you have with your health care provider. Document  Released: 11/28/2005 Document Revised: 05/05/2016 Document Reviewed: 08/05/2013 Elsevier Interactive Patient Education  2017 [employer]  

## 2018-01-18 NOTE — Assessment & Plan Note (Addendum)
Recent TSH was elevated, likely due to his nausea vomiting and impairment of absorption He is able to take his thyroid medicine consistently now I will check his thyroid function on a monthly basis

## 2018-01-18 NOTE — Patient Instructions (Signed)
[facility] Discharge Instructions for Patients Receiving Chemotherapy  Today you received the following chemotherapy agents methotrexate  To help prevent nausea and vomiting after your treatment, we encourage you to take your nausea medication as directed  If you develop nausea and vomiting that is not controlled by your nausea medication, call the clinic.   BELOW ARE SYMPTOMS THAT SHOULD BE REPORTED IMMEDIATELY:  *FEVER GREATER THAN 100.5 F  *CHILLS WITH OR WITHOUT FEVER  NAUSEA AND VOMITING THAT IS NOT CONTROLLED WITH YOUR NAUSEA MEDICATION  *UNUSUAL SHORTNESS OF BREATH  *UNUSUAL BRUISING OR BLEEDING  TENDERNESS IN MOUTH AND THROAT WITH OR WITHOUT PRESENCE OF ULCERS  *URINARY PROBLEMS  *BOWEL PROBLEMS  UNUSUAL RASH Items with * indicate a potential emergency and should be followed up as soon as possible.  Feel free to call the clinic you have any questions or concerns. The clinic phone number is [phone].

## 2018-01-18 NOTE — Progress Notes (Signed)
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[place])   03/15/2015 - 03/17/2015 Hospital Admission    He was admitted to the hospital for evaluation of dysphagia, SOB, hemoptosis, hoarseness, 30-40 pound weight loss and worsening bilateral neck masses for 5 months      03/15/2015 Imaging    Ct showed extensive circumferential malignancy in the hypopharyngeal/supraglottic region with regional LN metastases      03/16/2015 Procedure    He underwent ULTRASOUND-GUIDED BIOPSY OF LEFT CERVICAL LYMPH NODES      03/16/2015 Pathology Results    Accession: [id number] LN biopsy showed invasive squamous cell cancer      03/16/2015 Pathology Results    Accession: [id number] showed atypical squamous cells      03/25/2015 - 04/07/2015 Hospital Admission    He was admitted to the hospital and underwent tracheostomy placement, feeeding tube placement but subsequently left [facility]      03/26/2015 Surgery    He had multiple extraction of tooth numbers 1, 2, 5, 6, 7, 8, 9, 10, 11, 12, 13, 18, 19, 21, 22, 23, 24, 25, 26, 27, 28, and 29. and 4 Quadrants of alveoloplasty      03/26/2015 Surgery    He underwent tracheostomy      03/31/2015 Surgery    He had open gastrostomy tube placement by Dr. Donne Hazel      04/16/2015 - 05/18/2015 Radiation Therapy    Laryngopharynx and bilateral neck / 50 Gy in 20 fractions to gross disease, 45 Gy in 20 fractions to high risk nodal echelons  Beams/energy: Helical IMRT / 6 MV photons      04/16/2015 Procedure    Fluoroscopic reposition of the 18 French gastrostomy confirmed back in the stomach,      07/03/2015 Procedure    IR performed replacement of gastrostomy tube with a new 26 French balloon retention tube      10/01/2015 Imaging    PEt scan showed persistent hypermetabolism within the primary supraglottic laryngeal tumor and within right retropharyngeal, bilateral level II and left level IV cervical nodal metastases      10/28/2015 Procedure    He had placement of PICC line. The IR was not able to place PORT due to suspected upper respiratory infection      11/13/2015 - 03/21/2016 Chemotherapy    He received 5FU, carboplatin chemo with weekly Erbitux      11/19/2015 Surgery    Gastrostomy tube replaced.      11/30/2015 Procedure    Placement of right jugular port-a-cath.      11/30/2015 Procedure    PICC removed.      12/30/2015 Imaging    PET CT showed positive response to Rx      02/26/2016 Procedure    He underwent direct laryngoscopy with biopsy. Esophageal dilatation.       02/26/2016 Pathology Results    Repeat biopsy of supraglottis showed persistent disease      02/29/2016 Procedure    Gastrostomy tube exchanged.      03/28/2016 PET scan    Hypermetabolic tissue in the posterior RIGHT hypopharynx is similar in pattern to PET-CT of 12/30/2015 but increased in metabolic activity.2. Hypermetabolic tissue /  lymph nodes in the LEFT supraclavicular nodal station are in a similar pattern       04/04/2016 - 11/29/2016 Chemotherapy    He started on palliative chemotherapy with pembrolizumab      05/03/2016 Imaging    MRI head is negative      06/02/2016 Imaging    Mild improvement of diffuse pharyngeal and supraglottic edema.Increased asymmetry of right-sided hypopharyngeal/supraglottic softtissue compared to the prior neck CT with FDG uptake in this region on prior PET-CT. Residual/recurrent tumor is possible      08/09/2016 Procedure    IR placed new 20 French percutaneous gastrostomy tube.      08/26/2016 Imaging    Ct neck showed unchanged diffuse  pharyngeal and supraglottic edema as well as asymmetric soft tissue thickening on the right. Slightly decreased size of some left level II lymph nodes. Unchanged lymphadenopathy elsewhere in the neck. Decreased size of thyroid mass. No evidence of metastatic cancer to the chest      12/13/2016 Imaging    Ct chest showed no evidence of thoracic metastatic disease or primary thoracic malignancy.      12/13/2016 Imaging    Ct neck showed progression of of RIGHT supraglottic mass compared with priors, approximate size 24 x 27 x 27 mm. Extension caudally along the RIGHT area epiglottic fold with increasing mass effect on the airway. Tracheostomy satisfactory position. Stable to slightly improved malignant adenopathy.      12/26/2016 - 12/25/2017 Chemotherapy    He received chemotherapy with modified schedule of reduced dose Taxol       02/10/2017 Imaging    Very similar appearance to the study of January. Right sided supraglottic to glottic mass is similar, perhaps a few mm smaller. Bilateral enlarged nodes appear the same. No progressive finding      02/10/2017 Imaging    No evidence of metastatic disease in the abdomen/pelvis. Percutaneous gastrostomy in satisfactory position. Cholelithiasis, without associated inflammatory changes.      06/06/2017 Imaging    CT neck: Supraglottic tumor on the right is mildly smaller now measuring 23 x 16 mm. Improvement in bilateral cervical adenopathy. No new adenopathy identified      09/06/2017 Imaging    1. Essentially stable post treatment CT appearance of the neck since June (see #3). 2. Residual right supraglottic soft tissue asymmetry and indistinct 16 mm right level II nodal tissue is superimposed on post treatment changes including diffuse pharyngeal mucosal space soft tissue thickening. 3. A 15 mm round low-density mass in the left thyroid region is slowly enlarging and favored to be a thyroid nodule rather than on necrotic lymph node. Attention on  follow-up Neck CT versus dedicated Thyroid Ultrasound recommended.      12/04/2017 Imaging    1. Interval enlargement of right level II and left level IV lymph nodes most consistent with progressive metastatic disease. 2. Further enlargement of posterior left thyroid region mass also concerning for progressive metastatic disease (either nodal or extranodal) rather than a benign thyroid nodule. 3. Stable to minimal increase of the residual right supraglottic masslike soft tissue.      01/10/2018 -  Chemotherapy    The patient had methotrexate for chemotherapy treatment.         INTERVAL HISTORY: Please see below for problem oriented charting. He is seen prior to cycle 2 of treatment Since the last time I saw him he has slight improved control of his neck pain He is requesting dosage modification of his pain medicine He  had mild constipation recently Denies nausea, mucositis or diarrhea with recent chemotherapy No recent infection, fever or chills. Denies bleeding from tracheostomy site.  REVIEW OF SYSTEMS:   Constitutional: Denies fevers, chills or abnormal weight loss Eyes: Denies blurriness of vision Respiratory: Denies cough, dyspnea or wheezes Cardiovascular: Denies palpitation, chest discomfort or lower extremity swelling Skin: Denies abnormal skin rashes Lymphatics: Denies new lymphadenopathy or easy bruising Neurological:Denies numbness, tingling or new weaknesses Behavioral/Psych: Mood is stable, no new changes  All other systems were reviewed with the patient and are negative.  I have reviewed the past medical history, past surgical history, social history and family history with the patient and they are unchanged from previous note.  ALLERGIES:  is allergic to aspirin.  MEDICATIONS:  Current Outpatient Medications  Medication Sig Dispense Refill  . dexamethasone (DECADRON) 4 MG tablet Take 1 tablet (4 mg total) by mouth daily. 30 tablet 1  . fluticasone (FLONASE)  50 MCG/ACT nasal spray Place 2 sprays into both nostrils daily. 16 g 2  . folic acid (FOLVITE) 1 MG tablet Take 1 tablet (1 mg total) by mouth daily. 30 tablet 9  . gabapentin (NEURONTIN) 300 MG capsule Place 1 capsule (300 mg total) 3 (three) times daily into feeding tube. 90 capsule 11  . levothyroxine (SYNTHROID, LEVOTHROID) 100 MCG tablet TAKE 1 TABLET BY MOUTH ONCE DAILY BEFORE BREAKFAST 30 tablet 9  . morphine (MS CONTIN) 100 MG 12 hr tablet Take 1 tablet (100 mg total) by mouth every 8 (eight) hours. 90 tablet 0  . morphine (MSIR) 30 MG tablet Take 3 tablets (90 mg total) by mouth every 4 (four) hours as needed for severe pain. 90 tablet 0  . Nutritional Supplements (FEEDING SUPPLEMENT, JEVITY 1.5 CAL/FIBER,) LIQD Give 1 can Osmolite 1.2 + 1 can of Jevity 1.5 QID via PEG with 60 cc free water before and after bolus. Flush with 240 cc free water BID between feedings. 948 mL   . omeprazole (PRILOSEC) 40 MG capsule Take 1 capsule (40 mg total) by mouth daily. 90 capsule 3  . ondansetron (ZOFRAN) 8 MG tablet TAKE 1 TABLET BY MOUTH EVERY 8 HOURS AS NEEDED FOR NAUSEA 60 tablet 3  . sodium chloride irrigation 0.9 % irrigation USE TO CLEAN AROUND TRACH AND PERFORM TRACH CARE ONCE DAILY AND AS NEEDED 1000 mL 11  . triamcinolone (NASACORT AQ) 55 MCG/ACT AERO nasal inhaler Place 2 sprays into the nose daily. 1 Inhaler 12   Current Facility-Administered Medications  Medication Dose Route Frequency Provider Last Rate Last Dose  . guaiFENesin-dextromethorphan (ROBITUSSIN DM) 100-10 MG/5ML syrup 10 mL  10 mL Oral Q4H PRN Alvy Bimler, Kathyjo Briere, MD   10 mL at 01/09/17 1427   Facility-Administered Medications Ordered in Other Visits  Medication Dose Route Frequency Provider Last Rate Last Dose  . 0.9 %  sodium chloride infusion   Intravenous Once Folashade Gamboa, MD      . alteplase (CATHFLO ACTIVASE) injection 2 mg  2 mg Intracatheter Once PRN Alvy Bimler, Bellamy Rubey, MD      . heparin lock flush 100 unit/mL  500 Units  Intracatheter Once PRN Alvy Bimler, Enes Rokosz, MD      . heparin lock flush 100 unit/mL  250 Units Intracatheter Once PRN Alvy Bimler, Kempton Milne, MD      . methotrexate (PF) chemo injection 75 mg  40.125 mg/m2 (Treatment Plan Recorded) Intravenous Once Alvy Bimler, Ronit Marczak, MD      . ondansetron (ZOFRAN) injection 8 mg  8 mg Intravenous Once Susanne Borders,  NP      . sodium chloride 0.9 % injection 10 mL  10 mL Intracatheter PRN Alvy Bimler, Kilo Eshelman, MD   10 mL at 01/02/17 1107  . sodium chloride 0.9 % injection 10 mL  10 mL Intracatheter PRN Alvy Bimler, Kaeden Depaz, MD        PHYSICAL EXAMINATION: ECOG PERFORMANCE STATUS: 2 - Symptomatic, <50% confined to bed  Vitals:   01/18/18 1237  BP: 130/78  Pulse: 80  Resp: 18  Temp: 98.4 F (36.9 C)  SpO2: 100%   Filed Weights   01/18/18 1237  Weight: 163 lb 9.6 oz (74.2 kg)    GENERAL:alert, no distress and comfortable SKIN: skin color, texture, turgor are normal, no rashes or significant lesions EYES: normal, Conjunctiva are pink and non-injected, sclera clear OROPHARYNX:no exudate, no erythema and lips, buccal mucosa, and tongue normal  NECK: Tracheostomy site looks okay without bleeding  lYMPH:  no palpable lymphadenopathy in the cervical, axillary or inguinal LUNGS: clear to auscultation and percussion with normal breathing effort HEART: regular rate & rhythm and no murmurs and no lower extremity edema ABDOMEN:abdomen soft, non-tender and normal bowel sounds Musculoskeletal:no cyanosis of digits and no clubbing  NEURO: alert & oriented x 3 with fluent speech, no focal motor/sensory deficits  LABORATORY DATA:  I have reviewed the data as listed    Component Value Date/Time   NA 138 01/18/2018 1119   NA 139 12/11/2017 0910   K 3.8 01/18/2018 1119   K 3.6 12/11/2017 0910   CL 99 01/18/2018 1119   CO2 32 (H) 01/18/2018 1119   CO2 27 12/11/2017 0910   GLUCOSE 135 01/18/2018 1119   GLUCOSE 119 12/11/2017 0910   BUN 19 01/18/2018 1119   BUN 11.8 12/11/2017 0910   CREATININE  0.74 01/18/2018 1119   CREATININE 0.73 01/10/2018 0837   CREATININE 0.7 12/11/2017 0910   CALCIUM 9.5 01/18/2018 1119   CALCIUM 9.5 12/11/2017 0910   PROT 6.8 01/18/2018 1119   PROT 7.2 12/11/2017 0910   ALBUMIN 3.5 01/18/2018 1119   ALBUMIN 3.7 12/11/2017 0910   AST 11 01/18/2018 1119   AST 10 01/10/2018 0837   AST 11 12/11/2017 0910   ALT 28 01/18/2018 1119   ALT 10 01/10/2018 0837   ALT 12 12/11/2017 0910   ALKPHOS 113 01/18/2018 1119   ALKPHOS 75 12/11/2017 0910   BILITOT <0.2 (L) 01/18/2018 1119   BILITOT 0.2 01/10/2018 0837   BILITOT 0.42 12/11/2017 0910   GFRNONAA >60 01/18/2018 1119   GFRNONAA >60 01/10/2018 0837   GFRAA >60 01/18/2018 1119   GFRAA >60 01/10/2018 0837    No results found for: SPEP, UPEP  Lab Results  Component Value Date   WBC 19.9 (H) 01/18/2018   NEUTROABS 19.1 (H) 01/18/2018   HGB 11.8 (L) 01/18/2018   HCT 35.3 (L) 01/18/2018   MCV 92.8 01/18/2018   PLT 229 01/18/2018      Chemistry      Component Value Date/Time   NA 138 01/18/2018 1119   NA 139 12/11/2017 0910   K 3.8 01/18/2018 1119   K 3.6 12/11/2017 0910   CL 99 01/18/2018 1119   CO2 32 (H) 01/18/2018 1119   CO2 27 12/11/2017 0910   BUN 19 01/18/2018 1119   BUN 11.8 12/11/2017 0910   CREATININE 0.74 01/18/2018 1119   CREATININE 0.73 01/10/2018 0837   CREATININE 0.7 12/11/2017 0910      Component Value Date/Time   CALCIUM 9.5 01/18/2018 1119   CALCIUM  9.5 12/11/2017 0910   ALKPHOS 113 01/18/2018 1119   ALKPHOS 75 12/11/2017 0910   AST 11 01/18/2018 1119   AST 10 01/10/2018 0837   AST 11 12/11/2017 0910   ALT 28 01/18/2018 1119   ALT 10 01/10/2018 0837   ALT 12 12/11/2017 0910   BILITOT <0.2 (L) 01/18/2018 1119   BILITOT 0.2 01/10/2018 0837   BILITOT 0.42 12/11/2017 0910       ASSESSMENT & PLAN:  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[facility]) So far, he tolerated methotrexate treatment well The addition of dexamethasone had make significant difference in helping  with the throat swelling and pain I recommend we will continue weekly treatment x3, then rest 1 week I will see him back in 2 weeks for supportive care I recommend minimum 3 months of treatment before repeat imaging  Acquired hypothyroidism Recent TSH was elevated, likely due to his nausea vomiting and impairment of absorption He is able to take his thyroid medicine consistently now I will check his thyroid function on a monthly basis  Cancer associated pain The dexamethasone has helped He requested increased dose of immediate release morphine I have printed him multiple prescriptions I warned him about risk of sedation, nausea and constipation.  Tracheostomy care [facility]) He will continue local care of tracheostomy site through his ENT doctor  Other constipation Likely due to pain medicine I recommend aggressive laxative therapy   Orders Placed This Encounter  Procedures  . TSH    Standing Status:   Standing    Number of Occurrences:   11    Standing Expiration Date:   01/18/2019   All questions were answered. The patient knows to call the clinic with any problems, questions or concerns. No barriers to learning was detected. I spent 25 minutes counseling the patient face to face. The total time spent in the appointment was 30 minutes and more than 50% was on counseling and review of test results     [name], MD 01/18/2018 1:59 PM

## 2018-01-22 ENCOUNTER — Other Ambulatory Visit: Payer: Self-pay | Admitting: Hematology and Oncology

## 2018-01-22 DIAGNOSIS — C321 Malignant neoplasm of supraglottis: Secondary | ICD-10-CM

## 2018-01-22 MED FILL — SODIUM CHLORIDE 0.9% IRRIG.: 0.9 | 30 days supply | Qty: 1000 | Fill #1

## 2018-01-22 MED FILL — LACTULOSE 10 GM/15 ML SOLN: 10 | 10 days supply | Qty: 473 | Fill #0

## 2018-01-25 ENCOUNTER — Other Ambulatory Visit: Payer: Self-pay

## 2018-01-25 ENCOUNTER — Ambulatory Visit: Payer: Self-pay

## 2018-01-31 MED FILL — DEXAMETHASONE 4 MG TABLET: 4 | 30 days supply | Qty: 30 | Fill #1

## 2018-02-02 ENCOUNTER — Other Ambulatory Visit: Payer: Self-pay | Admitting: *Deleted

## 2018-02-02 ENCOUNTER — Telehealth: Payer: Self-pay | Admitting: Medical Oncology

## 2018-02-02 DIAGNOSIS — G893 Neoplasm related pain (acute) (chronic): Secondary | ICD-10-CM

## 2018-02-02 MED ORDER — MORPHINE SULFATE 30 MG PO TABS: 90.0000 mg | ORAL_TABLET | ORAL | 0 refills | Status: DC | PRN

## 2018-02-02 NOTE — Telephone Encounter (Signed)
Requests refill for MSIR and call him when ready for pick up.

## 2018-02-05 ENCOUNTER — Other Ambulatory Visit: Payer: Self-pay | Admitting: Hematology and Oncology

## 2018-02-05 MED FILL — MORPHINE SULFATE IR 30 MG T: 30 | 5 days supply | Qty: 90 | Fill #0

## 2018-02-05 MED FILL — LEVOTHYROXINE 100 MCG TAB: 100 | 30 days supply | Qty: 30 | Fill #1

## 2018-02-05 MED FILL — FOLIC ACID 1 MG TABLET: 1 | 30 days supply | Qty: 30 | Fill #1

## 2018-02-07 ENCOUNTER — Other Ambulatory Visit (HOSPITAL_COMMUNITY): Payer: Self-pay | Admitting: Radiology

## 2018-02-07 DIAGNOSIS — R633 Feeding difficulties, unspecified: Secondary | ICD-10-CM

## 2018-02-08 ENCOUNTER — Encounter (HOSPITAL_COMMUNITY): Payer: Self-pay

## 2018-02-08 ENCOUNTER — Inpatient Hospital Stay: Payer: Medicaid Other

## 2018-02-08 ENCOUNTER — Inpatient Hospital Stay (HOSPITAL_BASED_OUTPATIENT_CLINIC_OR_DEPARTMENT_OTHER): Payer: Medicaid Other | Admitting: Hematology and Oncology

## 2018-02-08 ENCOUNTER — Ambulatory Visit (HOSPITAL_COMMUNITY)
Admission: RE | Admit: 2018-02-08 | Discharge: 2018-02-08 | Disposition: A | Discharge status: Home / Self Care | Payer: Medicaid Other | Source: Ambulatory Visit | Attending: Radiology | Admitting: Radiology

## 2018-02-08 ENCOUNTER — Encounter: Payer: Self-pay | Admitting: Hematology and Oncology

## 2018-02-08 VITALS — BP 125/86 | HR 86 | Resp 16

## 2018-02-08 DIAGNOSIS — C321 Malignant neoplasm of supraglottis: Secondary | ICD-10-CM

## 2018-02-08 DIAGNOSIS — Z931 Gastrostomy status: Secondary | ICD-10-CM

## 2018-02-08 DIAGNOSIS — G893 Neoplasm related pain (acute) (chronic): Secondary | ICD-10-CM

## 2018-02-08 DIAGNOSIS — C770 Secondary and unspecified malignant neoplasm of lymph nodes of head, face and neck: Secondary | ICD-10-CM

## 2018-02-08 DIAGNOSIS — R633 Feeding difficulties, unspecified: Secondary | ICD-10-CM

## 2018-02-08 DIAGNOSIS — Z5111 Encounter for antineoplastic chemotherapy: Secondary | ICD-10-CM | POA: Diagnosis not present

## 2018-02-08 LAB — COMPREHENSIVE METABOLIC PANEL
ALT: 23 U/L (ref 0–55)
ANION GAP: 11 (ref 3–11)
AST: 12 U/L (ref 5–34)
Albumin: 3.2 g/dL — ABNORMAL LOW (ref 3.5–5.0)
Alkaline Phosphatase: 70 U/L (ref 40–150)
BILIRUBIN TOTAL: 0.4 mg/dL (ref 0.2–1.2)
BUN: 20 mg/dL (ref 7–26)
CO2: 28 mmol/L (ref 22–29)
Calcium: 9.4 mg/dL (ref 8.4–10.4)
Chloride: 100 mmol/L (ref 98–109)
Creatinine, Ser: 0.7 mg/dL (ref 0.70–1.30)
GFR calc non Af Amer: >60 mL/min (ref >60)
Glucose, Bld: 151 mg/dL — ABNORMAL HIGH (ref 70–140)
POTASSIUM: 3.5 mmol/L (ref 3.5–5.1)
Sodium: 139 mmol/L (ref 136–145)
TOTAL PROTEIN: 6.8 g/dL (ref 6.4–8.3)

## 2018-02-08 LAB — CBC WITH DIFFERENTIAL/PLATELET
BASOS ABS: 0 10*3/uL (ref 0.0–0.1)
Basophils Relative: 0 %
EOS PCT: 0 %
Eosinophils Absolute: 0 10*3/uL (ref 0.0–0.5)
HCT: 37.2 % — ABNORMAL LOW (ref 38.4–49.9)
HEMOGLOBIN: 12.2 g/dL — AB (ref 13.0–17.1)
LYMPHS PCT: 2 %
Lymphs Abs: 0.4 10*3/uL — ABNORMAL LOW (ref 0.9–3.3)
MCH: 30.7 pg (ref 27.2–33.4)
MCHC: 32.8 g/dL (ref 32.0–36.0)
MCV: 93.6 fL (ref 79.3–98.0)
Monocytes Absolute: 0.7 10*3/uL (ref 0.1–0.9)
Monocytes Relative: 5 %
NEUTROS ABS: 14.7 10*3/uL — AB (ref 1.5–6.5)
NEUTROS PCT: 93 %
PLATELETS: 182 10*3/uL (ref 140–400)
RBC: 3.97 MIL/uL — AB (ref 4.20–5.82)
RDW: 16.9 % — ABNORMAL HIGH (ref 11.0–14.6)
WBC: 15.8 10*3/uL — AB (ref 4.0–10.3)

## 2018-02-08 LAB — TSH: TSH: 0.649 u[IU]/mL (ref 0.320–4.118)

## 2018-02-08 MED ORDER — PROCHLORPERAZINE EDISYLATE 5 MG/ML IJ SOLN
INTRAMUSCULAR | Status: AC
  Filled 2018-02-08: qty 2

## 2018-02-08 MED ORDER — SODIUM CHLORIDE 0.9% FLUSH
10.0000 mL | Freq: Once | INTRAVENOUS | Status: AC
  Administered 2018-02-08: 10 mL
  Filled 2018-02-08: qty 10

## 2018-02-08 MED ORDER — HYDROMORPHONE HCL 2 MG/ML IJ SOLN
INTRAMUSCULAR | Status: AC
  Filled 2018-02-08: qty 1

## 2018-02-08 MED ORDER — METHOTREXATE SODIUM (PF) CHEMO INJECTION 250 MG/10ML
40.1000 mg/m2 | Freq: Once | INTRAMUSCULAR | Status: AC
  Administered 2018-02-08: 75 mg via INTRAVENOUS
  Filled 2018-02-08: qty 3

## 2018-02-08 MED ORDER — METHOTREXATE SODIUM (PF) CHEMO INJECTION 250 MG/10ML: 40.0000 mg/m2 | Freq: Once | INTRAMUSCULAR | Status: DC

## 2018-02-08 MED ORDER — PROCHLORPERAZINE EDISYLATE 5 MG/ML IJ SOLN
10.0000 mg | Freq: Once | INTRAMUSCULAR | Status: AC
  Administered 2018-02-08: 10 mg via INTRAVENOUS

## 2018-02-08 MED ORDER — HEPARIN SOD (PORK) LOCK FLUSH 100 UNIT/ML IV SOLN
500.0000 [IU] | Freq: Once | INTRAVENOUS | Status: AC
  Administered 2018-02-08: 500 [IU] via INTRAVENOUS
  Filled 2018-02-08: qty 5

## 2018-02-08 MED ORDER — HYDROMORPHONE HCL 4 MG/ML IJ SOLN
2.0000 mg | Freq: Once | INTRAMUSCULAR | Status: AC
Start: 2018-02-08 — End: 2018-02-08
  Administered 2018-02-08: 2 mg via INTRAVENOUS
  Filled 2018-02-08: qty 1

## 2018-02-08 NOTE — Assessment & Plan Note (Signed)
He had recent interruption of chemotherapy due to transportation issue We will resume chemotherapy today without dose adjustment The plan is to give him treatment on days 1, 8, 15 and a rest day 22 He is reminded to take folic acid supplementation while on treatment Plan for minimum 3 cycles of treatment before repeat imaging study

## 2018-02-08 NOTE — Assessment & Plan Note (Signed)
His feeding tube was not working well He has appointment to consult with interventional radiologist for further assessment He has lost some weight I encouraged him to increase oral intake as tolerated

## 2018-02-08 NOTE — Progress Notes (Signed)
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) He had recent interruption of chemotherapy due to transportation issue We will resume chemotherapy today without dose adjustment The plan is to give him treatment on days 1, 8, 15 and a rest day 22 He is reminded to take folic acid supplementation while on treatment Plan for minimum 3 cycles of treatment before repeat imaging study  Cancer associated pain The dexamethasone has helped He will continue taking current prescription pain medicine I have printed him multiple prescriptions I warned him about risk of sedation, nausea and constipation.  Gastrostomy tube in place [facility]) His feeding tube was not working well He has appointment to consult with interventional radiologist for further assessment He has lost some weight I encouraged him to increase oral intake as tolerated    No orders of the defined types were placed in this encounter.   INTERVAL HISTORY: Please see below for problem oriented charting. He returns to be assessed prior to chemotherapy He missed his last treatment due to difficulties with transportation His pain is stable with current prescription pain medicine Denies recent nausea or constipation Denies hemoptysis No mucositis from treatment.  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[place])   03/15/2015 - 03/17/2015 Hospital Admission    He was admitted to the hospital for evaluation of dysphagia, SOB, hemoptosis, hoarseness, 30-40 pound weight loss and worsening bilateral neck masses for 5 months      03/15/2015 Imaging     Ct showed extensive circumferential malignancy in the hypopharyngeal/supraglottic region with regional LN metastases      03/16/2015 Procedure    He underwent ULTRASOUND-GUIDED BIOPSY OF LEFT CERVICAL LYMPH NODES      03/16/2015 Pathology Results    Accession: [id number] LN biopsy showed invasive squamous cell cancer      03/16/2015 Pathology Results    Accession: [id number] showed atypical squamous cells      03/25/2015 - 04/07/2015 Hospital Admission    He was admitted to the hospital and underwent tracheostomy placement, feeeding tube placement but subsequently left [facility]      03/26/2015 Surgery    He had multiple extraction of tooth numbers 1, 2, 5, 6, 7, 8, 9, 10, 11, 12, 13, 18, 19, 21, 22, 23, 24, 25, 26, 27, 28, and 29. and 4 Quadrants of alveoloplasty      03/26/2015 Surgery    He underwent tracheostomy      03/31/2015 Surgery    He had open gastrostomy tube placement by Dr. Donne Hazel      04/16/2015 - 05/18/2015 Radiation Therapy    Laryngopharynx and bilateral neck / 50 Gy in 20 fractions to gross disease, 45 Gy in 20 fractions to high risk nodal echelons  Beams/energy: Helical IMRT / 6 MV photons      04/16/2015 Procedure    Fluoroscopic reposition of the 66 French gastrostomy confirmed back in the stomach,      07/03/2015 Procedure    IR performed replacement of gastrostomy tube with a new 58 French balloon retention tube      10/01/2015 Imaging    PEt scan showed persistent hypermetabolism within the primary supraglottic laryngeal tumor and within  right retropharyngeal, bilateral level II and left level IV cervical nodal metastases      10/28/2015 Procedure    He had placement of PICC line. The IR was not able to place PORT due to suspected upper respiratory infection      11/13/2015 - 03/21/2016 Chemotherapy    He received 5FU, carboplatin chemo with weekly Erbitux      11/19/2015 Surgery    Gastrostomy tube replaced.      11/30/2015 Procedure    Placement  of right jugular port-a-cath.      11/30/2015 Procedure    PICC removed.      12/30/2015 Imaging    PET CT showed positive response to Rx      02/26/2016 Procedure    He underwent direct laryngoscopy with biopsy. Esophageal dilatation.       02/26/2016 Pathology Results    Repeat biopsy of supraglottis showed persistent disease      02/29/2016 Procedure    Gastrostomy tube exchanged.      03/28/2016 PET scan    Hypermetabolic tissue in the posterior RIGHT hypopharynx is similar in pattern to PET-CT of 12/30/2015 but increased in metabolic activity.2. Hypermetabolic tissue / lymph nodes in the LEFT supraclavicular nodal station are in a similar pattern       04/04/2016 - 11/29/2016 Chemotherapy    He started on palliative chemotherapy with pembrolizumab      05/03/2016 Imaging    MRI head is negative      06/02/2016 Imaging    Mild improvement of diffuse pharyngeal and supraglottic edema.Increased asymmetry of right-sided hypopharyngeal/supraglottic softtissue compared to the prior neck CT with FDG uptake in this region on prior PET-CT. Residual/recurrent tumor is possible      08/09/2016 Procedure    IR placed new 20 French percutaneous gastrostomy tube.      08/26/2016 Imaging    Ct neck showed unchanged diffuse pharyngeal and supraglottic edema as well as asymmetric soft tissue thickening on the right. Slightly decreased size of some left level II lymph nodes. Unchanged lymphadenopathy elsewhere in the neck. Decreased size of thyroid mass. No evidence of metastatic cancer to the chest      12/13/2016 Imaging    Ct chest showed no evidence of thoracic metastatic disease or primary thoracic malignancy.      12/13/2016 Imaging    Ct neck showed progression of of RIGHT supraglottic mass compared with priors, approximate size 24 x 27 x 27 mm. Extension caudally along the RIGHT area epiglottic fold with increasing mass effect on the airway. Tracheostomy satisfactory position. Stable  to slightly improved malignant adenopathy.      12/26/2016 - 12/25/2017 Chemotherapy    He received chemotherapy with modified schedule of reduced dose Taxol       02/10/2017 Imaging    Very similar appearance to the study of January. Right sided supraglottic to glottic mass is similar, perhaps a few mm smaller. Bilateral enlarged nodes appear the same. No progressive finding      02/10/2017 Imaging    No evidence of metastatic disease in the abdomen/pelvis. Percutaneous gastrostomy in satisfactory position. Cholelithiasis, without associated inflammatory changes.      06/06/2017 Imaging    CT neck: Supraglottic tumor on the right is mildly smaller now measuring 23 x 16 mm. Improvement in bilateral cervical adenopathy. No new adenopathy identified      09/06/2017 Imaging    1. Essentially stable post treatment CT appearance of the neck since June (see #3). 2. Residual right  supraglottic soft tissue asymmetry and indistinct 16 mm right level II nodal tissue is superimposed on post treatment changes including diffuse pharyngeal mucosal space soft tissue thickening. 3. A 15 mm round low-density mass in the left thyroid region is slowly enlarging and favored to be a thyroid nodule rather than on necrotic lymph node. Attention on follow-up Neck CT versus dedicated Thyroid Ultrasound recommended.      12/04/2017 Imaging    1. Interval enlargement of right level II and left level IV lymph nodes most consistent with progressive metastatic disease. 2. Further enlargement of posterior left thyroid region mass also concerning for progressive metastatic disease (either nodal or extranodal) rather than a benign thyroid nodule. 3. Stable to minimal increase of the residual right supraglottic masslike soft tissue.      01/10/2018 -  Chemotherapy    The patient had methotrexate for chemotherapy treatment.         REVIEW OF SYSTEMS:   Constitutional: Denies fevers, chills Eyes: Denies blurriness of  vision Respiratory: Denies cough, dyspnea or wheezes Cardiovascular: Denies palpitation, chest discomfort or lower extremity swelling Gastrointestinal:  Denies nausea, heartburn or change in bowel habits Skin: Denies abnormal skin rashes Lymphatics: Denies new lymphadenopathy or easy bruising Neurological:Denies numbness, tingling or new weaknesses Behavioral/Psych: Mood is stable, no new changes  All other systems were reviewed with the patient and are negative.  I have reviewed the past medical history, past surgical history, social history and family history with the patient and they are unchanged from previous note.  ALLERGIES:  is allergic to aspirin.  MEDICATIONS:  Current Outpatient Medications  Medication Sig Dispense Refill  . dexamethasone (DECADRON) 4 MG tablet TAKE 1 TABLET BY MOUTH DAILY. 30 tablet 1  . fluticasone (FLONASE) 50 MCG/ACT nasal spray Place 2 sprays into both nostrils daily. 16 g 2  . folic acid (FOLVITE) 1 MG tablet Take 1 tablet (1 mg total) by mouth daily. 30 tablet 9  . gabapentin (NEURONTIN) 300 MG capsule Place 1 capsule (300 mg total) 3 (three) times daily into feeding tube. 90 capsule 11  . lactulose (CHRONULAC) 10 GM/15ML solution TAKE 15 MLS (10 G TOTAL) BY MOUTH 3 TIMES DAILY. 473 mL 2  . levothyroxine (SYNTHROID, LEVOTHROID) 100 MCG tablet TAKE 1 TABLET BY MOUTH ONCE DAILY BEFORE BREAKFAST 30 tablet 9  . morphine (MS CONTIN) 100 MG 12 hr tablet Take 1 tablet (100 mg total) by mouth every 8 (eight) hours. 90 tablet 0  . morphine (MSIR) 30 MG tablet Take 3 tablets (90 mg total) by mouth every 4 (four) hours as needed for severe pain. 90 tablet 0  . Nutritional Supplements (FEEDING SUPPLEMENT, JEVITY 1.5 CAL/FIBER,) LIQD Give 1 can Osmolite 1.2 + 1 can of Jevity 1.5 QID via PEG with 60 cc free water before and after bolus. Flush with 240 cc free water BID between feedings. 948 mL   . omeprazole (PRILOSEC) 40 MG capsule Take 1 capsule (40 mg total) by mouth  daily. 90 capsule 3  . ondansetron (ZOFRAN) 8 MG tablet TAKE 1 TABLET BY MOUTH EVERY 8 HOURS AS NEEDED FOR NAUSEA 60 tablet 3  . sodium chloride irrigation 0.9 % irrigation USE TO CLEAN AROUND TRACH AND PERFORM TRACH CARE ONCE DAILY AND AS NEEDED 1000 mL 11  . triamcinolone (NASACORT AQ) 55 MCG/ACT AERO nasal inhaler Place 2 sprays into the nose daily. 1 Inhaler 12   Current Facility-Administered Medications  Medication Dose Route Frequency Provider Last Rate Last Dose  . guaiFENesin-dextromethorphan (  ROBITUSSIN DM) 100-10 MG/5ML syrup 10 mL  10 mL Oral Q4H PRN Alvy Bimler, Era Parr, MD   10 mL at 01/09/17 1427   Facility-Administered Medications Ordered in Other Visits  Medication Dose Route Frequency Provider Last Rate Last Dose  . 0.9 %  sodium chloride infusion   Intravenous Once Alvy Bimler, Yesica Kemler, MD      . alteplase (CATHFLO ACTIVASE) injection 2 mg  2 mg Intracatheter Once PRN Alvy Bimler, Keegen Heffern, MD      . heparin lock flush 100 unit/mL  500 Units Intracatheter Once PRN Alvy Bimler, Keiaira Donlan, MD      . heparin lock flush 100 unit/mL  250 Units Intracatheter Once PRN Alvy Bimler, Demarlo Riojas, MD      . ondansetron (ZOFRAN) injection 8 mg  8 mg Intravenous Once Drue Second R, NP      . sodium chloride 0.9 % injection 10 mL  10 mL Intracatheter PRN Alvy Bimler, Charnise Lovan, MD   10 mL at 01/02/17 1107  . sodium chloride 0.9 % injection 10 mL  10 mL Intracatheter PRN Alvy Bimler, Sajjad Honea, MD        PHYSICAL EXAMINATION: ECOG PERFORMANCE STATUS: 1 - Symptomatic but completely ambulatory  Vitals:   02/08/18 0940  BP: (!) 144/87  Pulse: (!) 105  Resp: 18  Temp: 98.7 F (37.1 C)  SpO2: 100%   Filed Weights   02/08/18 0940  Weight: 160 lb 9.6 oz (72.8 kg)    GENERAL:alert, no distress and comfortable SKIN: skin color, texture, turgor are normal, no rashes or significant lesions EYES: normal, Conjunctiva are pink and non-injected, sclera clear OROPHARYNX:no exudate, no erythema and lips, buccal mucosa, and tongue normal  NECK: Tracheostomy in  situ. LYMPH:  no palpable lymphadenopathy in the cervical, axillary or inguinal LUNGS: clear to auscultation and percussion with normal breathing effort HEART: regular rate & rhythm and no murmurs and no lower extremity edema ABDOMEN:abdomen soft, non-tender and normal bowel sounds.  Feeding tube site looks okay Musculoskeletal:no cyanosis of digits and no clubbing  NEURO: alert & oriented x 3 with fluent speech, no focal motor/sensory deficits  LABORATORY DATA:  I have reviewed the data as listed    Component Value Date/Time   NA 139 02/08/2018 0905   NA 139 12/11/2017 0910   K 3.5 02/08/2018 0905   K 3.6 12/11/2017 0910   CL 100 02/08/2018 0905   CO2 28 02/08/2018 0905   CO2 27 12/11/2017 0910   GLUCOSE 151 (H) 02/08/2018 0905   GLUCOSE 119 12/11/2017 0910   BUN 20 02/08/2018 0905   BUN 11.8 12/11/2017 0910   CREATININE 0.70 02/08/2018 0905   CREATININE 0.73 01/10/2018 0837   CREATININE 0.7 12/11/2017 0910   CALCIUM 9.4 02/08/2018 0905   CALCIUM 9.5 12/11/2017 0910   PROT 6.8 02/08/2018 0905   PROT 7.2 12/11/2017 0910   ALBUMIN 3.2 (L) 02/08/2018 0905   ALBUMIN 3.7 12/11/2017 0910   AST 12 02/08/2018 0905   AST 10 01/10/2018 0837   AST 11 12/11/2017 0910   ALT 23 02/08/2018 0905   ALT 10 01/10/2018 0837   ALT 12 12/11/2017 0910   ALKPHOS 70 02/08/2018 0905   ALKPHOS 75 12/11/2017 0910   BILITOT 0.4 02/08/2018 0905   BILITOT 0.2 01/10/2018 0837   BILITOT 0.42 12/11/2017 0910   GFRNONAA >60 02/08/2018 0905   GFRNONAA >60 01/10/2018 0837   GFRAA >60 02/08/2018 0905   GFRAA >60 01/10/2018 0837    No results found for: SPEP, UPEP  Lab Results  Component Value  Date   WBC 15.8 (H) 02/08/2018   NEUTROABS 14.7 (H) 02/08/2018   HGB 12.2 (L) 02/08/2018   HCT 37.2 (L) 02/08/2018   MCV 93.6 02/08/2018   PLT 182 02/08/2018      Chemistry      Component Value Date/Time   NA 139 02/08/2018 0905   NA 139 12/11/2017 0910   K 3.5 02/08/2018 0905   K 3.6 12/11/2017  0910   CL 100 02/08/2018 0905   CO2 28 02/08/2018 0905   CO2 27 12/11/2017 0910   BUN 20 02/08/2018 0905   BUN 11.8 12/11/2017 0910   CREATININE 0.70 02/08/2018 0905   CREATININE 0.73 01/10/2018 0837   CREATININE 0.7 12/11/2017 0910      Component Value Date/Time   CALCIUM 9.4 02/08/2018 0905   CALCIUM 9.5 12/11/2017 0910   ALKPHOS 70 02/08/2018 0905   ALKPHOS 75 12/11/2017 0910   AST 12 02/08/2018 0905   AST 10 01/10/2018 0837   AST 11 12/11/2017 0910   ALT 23 02/08/2018 0905   ALT 10 01/10/2018 0837   ALT 12 12/11/2017 0910   BILITOT 0.4 02/08/2018 0905   BILITOT 0.2 01/10/2018 0837   BILITOT 0.42 12/11/2017 0910       All questions were answered. The patient knows to call the clinic with any problems, questions or concerns. No barriers to learning was detected.  I spent 15 minutes counseling the patient face to face. The total time spent in the appointment was 20 minutes and more than 50% was on counseling and review of test results  [name], MD 02/08/2018 9:56 AM

## 2018-02-08 NOTE — Patient Instructions (Signed)
[place] Discharge Instructions for Patients Receiving Chemotherapy  Today you received the following chemotherapy agents: Methotrexate (PF).  To help prevent nausea and vomiting after your treatment, we encourage you to take your nausea medication as prescribed.  If you develop nausea and vomiting that is not controlled by your nausea medication, call the clinic.   BELOW ARE SYMPTOMS THAT SHOULD BE REPORTED IMMEDIATELY:  *FEVER GREATER THAN 100.5 F  *CHILLS WITH OR WITHOUT FEVER  NAUSEA AND VOMITING THAT IS NOT CONTROLLED WITH YOUR NAUSEA MEDICATION  *UNUSUAL SHORTNESS OF BREATH  *UNUSUAL BRUISING OR BLEEDING  TENDERNESS IN MOUTH AND THROAT WITH OR WITHOUT PRESENCE OF ULCERS  *URINARY PROBLEMS  *BOWEL PROBLEMS  UNUSUAL RASH Items with * indicate a potential emergency and should be followed up as soon as possible.  Feel free to call the clinic should you have any questions or concerns. The clinic phone number is [phone].  Please show the [place] at check-in to the Emergency Department and triage nurse.

## 2018-02-08 NOTE — Patient Instructions (Signed)
Implanted Port Home Guide An implanted port is a type of central line that is placed under the skin. Central lines are used to provide IV access when treatment or nutrition needs to be given through a person's veins. Implanted ports are used for long-term IV access. An implanted port may be placed because:  You need IV medicine that would be irritating to the small veins in your hands or arms.  You need long-term IV medicines, such as antibiotics.  You need IV nutrition for a long period.  You need frequent blood draws for lab tests.  You need dialysis.  Implanted ports are usually placed in the chest area, but they can also be placed in the upper arm, the abdomen, or the leg. An implanted port has two main parts:  Reservoir. The reservoir is round and will appear as a small, raised area under your skin. The reservoir is the part where a needle is inserted to give medicines or draw blood.  Catheter. The catheter is a thin, flexible tube that extends from the reservoir. The catheter is placed into a large vein. Medicine that is inserted into the reservoir goes into the catheter and then into the vein.  How will I care for my incision site? Do not get the incision site wet. Bathe or shower as directed by your health care provider. How is my port accessed? Special steps must be taken to access the port:  Before the port is accessed, a numbing cream can be placed on the skin. This helps numb the skin over the port site.  Your health care provider uses a sterile technique to access the port. ? Your health care provider must put on a mask and sterile gloves. ? The skin over your port is cleaned carefully with an antiseptic and allowed to dry. ? The port is gently pinched between sterile gloves, and a needle is inserted into the port.  Only "non-coring" port needles should be used to access the port. Once the port is accessed, a blood return should be checked. This helps ensure that the port  is in the vein and is not clogged.  If your port needs to remain accessed for a constant infusion, a clear (transparent) bandage will be placed over the needle site. The bandage and needle will need to be changed every week, or as directed by your health care provider.  Keep the bandage covering the needle clean and dry. Do not get it wet. Follow your health care provider's instructions on how to take a shower or bath while the port is accessed.  If your port does not need to stay accessed, no bandage is needed over the port.  What is flushing? Flushing helps keep the port from getting clogged. Follow your health care provider's instructions on how and when to flush the port. Ports are usually flushed with saline solution or a medicine called heparin. The need for flushing will depend on how the port is used.  If the port is used for intermittent medicines or blood draws, the port will need to be flushed: ? After medicines have been given. ? After blood has been drawn. ? As part of routine maintenance.  If a constant infusion is running, the port may not need to be flushed.  How long will my port stay implanted? The port can stay in for as long as your health care provider thinks it is needed. When it is time for the port to come out, surgery will be   done to remove it. The procedure is similar to the one performed when the port was put in. When should I seek immediate medical care? When you have an implanted port, you should seek immediate medical care if:  You notice a bad smell coming from the incision site.  You have swelling, redness, or drainage at the incision site.  You have more swelling or pain at the port site or the surrounding area.  You have a fever that is not controlled with medicine.  This information is not intended to replace advice given to you by your health care provider. Make sure you discuss any questions you have with your health care provider. Document  Released: 11/28/2005 Document Revised: 05/05/2016 Document Reviewed: 08/05/2013 Elsevier Interactive Patient Education  2017 [employer]  

## 2018-02-08 NOTE — Assessment & Plan Note (Signed)
The dexamethasone has helped He will continue taking current prescription pain medicine I have printed him multiple prescriptions I warned him about risk of sedation, nausea and constipation.

## 2018-02-08 NOTE — Progress Notes (Signed)
1045- patient requesting pain medication. MD notified and will give per order.  Treatment given per orders. Patient tolerated it well without problems. Vitals stable and discharged home from clinic ambulatory. Follow up as scheduled.

## 2018-02-11 ENCOUNTER — Emergency Department (HOSPITAL_COMMUNITY): Payer: Medicaid Other

## 2018-02-11 ENCOUNTER — Encounter (HOSPITAL_COMMUNITY): Payer: Self-pay | Admitting: *Deleted

## 2018-02-11 ENCOUNTER — Emergency Department (HOSPITAL_COMMUNITY)
Admission: EM | Admit: 2018-02-11 | Discharge: 2018-02-11 | Disposition: A | Discharge status: Home / Self Care | Payer: Medicaid Other | Attending: Emergency Medicine | Admitting: Emergency Medicine

## 2018-02-11 DIAGNOSIS — Z4659 Encounter for fitting and adjustment of other gastrointestinal appliance and device: Secondary | ICD-10-CM

## 2018-02-11 DIAGNOSIS — K9420 Gastrostomy complication, unspecified: Secondary | ICD-10-CM

## 2018-02-11 DIAGNOSIS — C140 Malignant neoplasm of pharynx, unspecified: Secondary | ICD-10-CM | POA: Diagnosis not present

## 2018-02-11 DIAGNOSIS — Y732 Prosthetic and other implants, materials and accessory gastroenterology and urology devices associated with adverse incidents: Secondary | ICD-10-CM | POA: Diagnosis not present

## 2018-02-11 DIAGNOSIS — Z87891 Personal history of nicotine dependence: Secondary | ICD-10-CM | POA: Diagnosis not present

## 2018-02-11 DIAGNOSIS — Z923 Personal history of irradiation: Secondary | ICD-10-CM | POA: Insufficient documentation

## 2018-02-11 DIAGNOSIS — K9423 Gastrostomy malfunction: Secondary | ICD-10-CM

## 2018-02-11 DIAGNOSIS — I10 Essential (primary) hypertension: Secondary | ICD-10-CM | POA: Insufficient documentation

## 2018-02-11 DIAGNOSIS — T85528A Displacement of other gastrointestinal prosthetic devices, implants and grafts, initial encounter: Secondary | ICD-10-CM | POA: Diagnosis present

## 2018-02-11 DIAGNOSIS — Z431 Encounter for attention to gastrostomy: Secondary | ICD-10-CM

## 2018-02-11 MED ORDER — IOPAMIDOL (ISOVUE-300) INJECTION 61%
INTRAVENOUS | Status: AC
  Administered 2018-02-11: 30 mL via GASTROSTOMY
  Filled 2018-02-11: qty 30

## 2018-02-11 MED ORDER — DIATRIZOATE MEGLUMINE & SODIUM 66-10 % PO SOLN: 20.0000 mL | Freq: Once | ORAL | Status: DC

## 2018-02-11 NOTE — Discharge Instructions (Signed)
We were able to replace your feeding tube. You may now use the feeding tube. Follow up with your PCP and return to the ED with any new or worsening symptoms.

## 2018-02-11 NOTE — ED Triage Notes (Signed)
Pt states his feeding tube fell out this morning and needs it put back in. Pt states he has been unable to take his regular medications and is in pain all over.

## 2018-02-11 NOTE — ED Provider Notes (Signed)
Emergency Department Provider Note   I have reviewed the triage vital signs and the nursing notes.   HISTORY  Chief Complaint feeding tube came out   HPI NIVEK POWLEY is a 57 y.o. male with PMH of HTN, neck cancer s/p radiation with G-tube resents to the emergency department for evaluation after the G-tube fell out this morning.  Patient had the tube last changed 3 months ago.  He has had the tube in for 3 years without significant complications.  He is concerned that the balloon material may have ruptured causing it to be pulled out.  He is not having significant abdominal pain, drainage from the ostomy, or fever.   Past Medical History:  Diagnosis Date  . Fatigue 03/28/2016  . GERD (gastroesophageal reflux disease)   . Heartburn symptom 10/19/2015  . Hypertension   . Insomnia 01/22/2016  . Nausea & vomiting 12/15/2015  . S/P radiation therapy 04/16/2015-05/18/2015   Laryngopharynx and bilateral neck / 50 Gy in 20 fractions to gross disease, 45 Gy in 20 fractions to high risk nodal echelons   . Seizures ([place])   . Shortness of breath dyspnea   . Syncopal episodes 04/25/2016  . Throat cancer ([place]) 03/16/2015   left cervical lymph node / throat (2016)    Patient Active Problem List   Diagnosis Date Noted  . Other constipation 01/18/2018  . Bilateral leg edema 04/25/2017  . Dehydration 03/09/2017  . Abdominal pain 01/30/2017  . Weight loss 01/30/2017  . 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 DNR (do not resuscitate) 12/15/2016  . Acute bronchitis with chronic obstructive pulmonary disease (COPD) ([place]) 11/29/2016  . Acquired hypothyroidism 08/09/2016  . Back skin lesion 08/08/2016  . Syncopal episodes 04/25/2016  . Fatigue 03/28/2016  . 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 Cancer associated pain 02/19/2016  . Abscess of face 02/01/2016  . Insomnia 01/22/2016  . Pancytopenia due to antineoplastic  chemotherapy ([place]) 01/22/2016  . Esophagitis, unspecified 01/22/2016  . Hemoptysis 01/22/2016  . Chemotherapy-induced nausea 01/01/2016  . Nausea & vomiting 12/15/2015  . Acneiform rash 12/11/2015  . Nasal drainage 10/30/2015  . Gastrostomy tube in place ([place]) 10/19/2015  . Heartburn symptom 10/19/2015  . Tracheostomy status ([place])   . Hypomagnesemia   . Tracheostomy care ([place])   . 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 Vitamin D deficiency 03/17/2015  . Folate deficiency 03/16/2015  . Protein-calorie malnutrition, severe ([place]) 03/16/2015  . HTN (hypertension) 03/15/2015  . Tobacco abuse 03/15/2015  . Alcohol withdrawal seizure ([place]) 03/15/2015  . Prolonged Q-T interval on ECG 03/15/2015  . Aortic dilatation ([place]) 03/15/2015  . Thyroid nodule 03/15/2015  . Lymphadenopathy 03/15/2015  . Hypokalemia 03/15/2015  . B12 deficiency 03/15/2015  . Marijuana abuse 03/15/2015    Past Surgical History:  Procedure Laterality Date  . DIRECT LARYNGOSCOPY WITH BOTOX INJECTION N/A 02/26/2016   Procedure: DIRECT LARYNGOSCOPY ESOPHAGOSCOPY DILATION  AND TRACH REPLACMENT;  Surgeon: [name], MD;  Location: [facility];  Service: ENT;  Laterality: N/A;  . ESOPHAGOSCOPY WITH DILITATION N/A 02/26/2016   Procedure: ESOPHAGOSCOPY WITH DILITATION, PHARNGEAL BIOPSY;  Surgeon: [name], MD;  Location: [facility];  Service: ENT;  Laterality: N/A;  . GASTROSTOMY N/A 03/31/2015   Procedure: OPEN GASTROSTOMY TUBE PLACEMENT;  Surgeon: Rolm Bookbinder, MD;  Location: [facility];  Service: General;  Laterality: N/A;  . HERNIA REPAIR     right groin  .  IR GENERIC HISTORICAL  08/09/2016   IR GASTRIC TUBE PERC CHG W/O IMG GUIDE 08/09/2016 Jacqulynn Cadet, MD [facility]  . IR GENERIC HISTORICAL  02/10/2017   IR [place] TUBE PERCUT W/FLUORO 02/10/2017 Sandi Mariscal, MD [facility]  . IR PATIENT EVAL TECH 0-60 MINS  04/12/2017  . IR [place]  TUBE PERCUT W/FLUORO  10/30/2017  . KNEE SURGERY  1998   Left  . LYMPH NODE BIOPSY Left 03/16/15   left cervical, consistent with squamous cell carcinoma  . MULTIPLE EXTRACTIONS WITH ALVEOLOPLASTY N/A 03/26/2015   Procedure: EXTRACTION OF TOOTH #'S 1,2,5,6,7,8,9,10,11,12,13,18,19,21,22,23,24,25,26,27,28,29  WITH AVELOPLASTY;  Surgeon: Lenn Cal, DDS;  Location: [facility];  Service: Oral Surgery;  Laterality: N/A;  . TONSILLECTOMY    . TONSILLECTOMY AND ADENOIDECTOMY    . TOOTH EXTRACTION  03/26/2015   Procedure: [place];  Surgeon: [name], MD;  Location: [facility];  Service: ENT;;  . TRACHEOSTOMY TUBE PLACEMENT N/A 03/26/2015   Procedure: TRACHEOSTOMY ;  Surgeon: [name], MD;  Location: [facility];  Service: ENT;  Laterality: N/A;    Current Outpatient Rx  . Order #: [id number] Class: Normal  . Order #: [id number] Class: Normal  . Order #: [id number] Class: Normal  . Order #: [id number] Class: Print  . Order #: [id number] Class: Normal  . Order #: [id number] Class: Normal  . Order #: [id number] Class: Print  . Order #: [id number] Class: Print  . Order #: [id number] Class: No Print  . Order #: [id number] Class: Normal  . Order #: [id number] Class: Normal  . Order #: [id number] Class: Normal  . Order #: [id number] Class: Normal    Allergies Aspirin  Family History  Problem Relation Age of Onset  . Heart disease Mother   . Diabetes Father   . Hypertension Father     Social History Social History   Tobacco Use  . Smoking status: Former Smoker    Packs/day: 0.00    Years: 35.00    Pack years: 0.00    Types: Cigarettes  . Smokeless tobacco: Former Systems developer    Types: Chew  . Tobacco comment: 2-4 cig per day  Substance Use Topics  . Alcohol use: Yes    Alcohol/week: 18.0 oz    Types: 30 Standard drinks or equivalent per week    Comment: occasional  . Drug use: Yes    Types: Marijuana    Comment: occasional     Review of Systems  Constitutional: No fever/chills Eyes: No visual changes. ENT:  No sore throat. Cardiovascular: Denies chest pain. Respiratory: Denies shortness of breath. Gastrointestinal: No abdominal pain.  No nausea, no vomiting.  No diarrhea.  No constipation. Positive dislodged G-tube (84F).  Genitourinary: Negative for dysuria. Musculoskeletal: Negative for back pain. Skin: Negative for rash. Neurological: Negative for headaches, focal weakness or numbness.  10-point ROS otherwise negative.  ____________________________________________   PHYSICAL EXAM:  VITAL SIGNS: ED Triage Vitals  Enc Vitals Group     BP 02/11/18 1422 (!) 151/87     Pulse Rate 02/11/18 1422 (!) 103     Resp 02/11/18 1422 (!) 25     Temp 02/11/18 1422 99 F (37.2 C)     Temp Source 02/11/18 1422 Oral     SpO2 02/11/18 1422 99 %     Pain Score 02/11/18 1430 10   Constitutional: Alert and oriented. Well appearing and in no acute distress. Eyes: Conjunctivae are normal.  Head: Atraumatic. Nose: No congestion/rhinnorhea. Mouth/Throat: Mucous membranes are moist.  Oropharynx non-erythematous. Neck: No stridor. Trach stoma is well-appearing without surrounding  cellulitis or excess drainage.  Cardiovascular: Normal rate, regular rhythm. Good peripheral circulation. Grossly normal heart sounds.   Respiratory: Normal respiratory effort.  No retractions. Lungs CTAB. Gastrointestinal: Soft and nontender. No distention. G-tube stoma over the left upper abdomen without tenderness, drainage, or bleeding. No surrounding cellulitis.  Musculoskeletal: No lower extremity tenderness nor edema. No gross deformities of extremities. Neurologic:  No gross focal neurologic deficits are appreciated.  Skin:  Skin is warm, dry and intact. No rash noted.  ____________________________________________  [id number]  Dg Abdomen Peg Tube Location  Result Date: 02/11/2018 CLINICAL DATA:  Replaced PEG tube with PEG tube injection. EXAM: ABDOMEN - 1 VIEW COMPARISON:  10/30/2017 FINDINGS: Contrast has been  injected through the replaced gastrostomy tube. Contrast fills the stomach and extends into the duodenum. There is no contrast extravasation. The balloon of the gastrostomy tube projects in the gastric antrum. IMPRESSION: Well-positioned gastrostomy tube after replacement. Tube is ready for use. Electronically Signed   By: Lajean Manes M.D.   On: 02/11/2018 15:15    ____________________________________________   PROCEDURES  Procedure(s) performed:   Gastrostomy tube replacement Date/Time: 02/11/2018 2:50 PM Performed by: Margette Fast, MD Authorized by: Margette Fast, MD  Consent: Verbal consent obtained. Risks and benefits: risks, benefits and alternatives were discussed Consent given by: patient Patient understanding: patient states understanding of the procedure being performed Patient identity confirmed: verbally with patient and hospital-assigned identification number Time out: Immediately prior to procedure a "time out" was called to verify the correct patient, procedure, equipment, support staff and site/side marked as required. Local anesthesia used: no  Anesthesia: Local anesthesia used: no  Sedation: Patient sedated: no  Patient tolerance: Patient tolerated the procedure well with no immediate complications Comments: 77O G-tube was gently advanced into the patient's ostomy with easy passage into the abdominal cavity. No pain with inflation of the balloon. Traction applied against the inflated balloon with tube appearing to be firmly seated. No bleeding during procedure. No immediate complications.       ____________________________________________   INITIAL IMPRESSION / ASSESSMENT AND PLAN / ED COURSE  Pertinent labs & imaging results that were available during my care of the patient were reviewed by me and considered in my medical decision making (see chart for details).  Patient presents to the ED after 20 F G-tube became dislodged this AM. The tract is  well-established. No surrounding cellulitis or bleeding. Patient is well appearing. In exam of the old g-tube the balloon has ruptured. The balloon material seems to be intact. No balloon material visible in the stoma.   After replacement of the G-tube described above the x-ray showing correct placement was reviewed. No active bleeding from the site after replacement. Patient is free to use the tube.   At this time, I do not feel there is any life-threatening condition present. I have reviewed and discussed all results (EKG, imaging, lab, urine as appropriate), exam findings with patient. I have reviewed nursing notes and appropriate previous records.  I feel the patient is safe to be discharged home without further emergent workup. Discussed usual and customary return precautions. Patient and family (if present) verbalize understanding and are comfortable with this plan.  Patient will follow-up with their primary care provider. If they do not have a primary care provider, information for follow-up has been provided to them. All questions have been answered.  ____________________________________________  FINAL CLINICAL IMPRESSION(S) / ED DIAGNOSES  Final diagnoses:  Encounter for feeding tube placement  Complication of feeding  tube [facility])     MEDICATIONS GIVEN DURING THIS VISIT:  Medications  iopamidol (ISOVUE-300) 61 % injection (30 mLs PEG Tube Contrast Given 02/11/18 1512)    Note:  This document was prepared using Dragon voice recognition software and may include unintentional dictation errors.  Nanda Quinton, MD Emergency Medicine    Nyema Hachey, [facility], MD 02/11/18 [place]

## 2018-02-13 ENCOUNTER — Telehealth: Payer: Self-pay | Admitting: *Deleted

## 2018-02-13 ENCOUNTER — Other Ambulatory Visit: Payer: Self-pay | Admitting: Hematology and Oncology

## 2018-02-13 DIAGNOSIS — G893 Neoplasm related pain (acute) (chronic): Secondary | ICD-10-CM

## 2018-02-13 MED ORDER — MORPHINE SULFATE 30 MG PO TABS: 90.0000 mg | ORAL_TABLET | ORAL | 0 refills | Status: DC | PRN

## 2018-02-13 MED FILL — SODIUM CHLORIDE 0.9% IRRIG.: 0.9 | 25 days supply | Qty: 1000 | Fill #2

## 2018-02-13 MED FILL — MORPHINE SULFATE IR 30 MG T: 30 | 5 days supply | Qty: 90 | Fill #0

## 2018-02-13 NOTE — Telephone Encounter (Signed)
Notified of message below

## 2018-02-13 NOTE — Telephone Encounter (Signed)
Regan is requesting a refill of his MSIR 30 mg. States he is taking 3 tablets about every 5 hours when he eats.

## 2018-02-13 NOTE — Telephone Encounter (Signed)
PLs remind him it makes more sense if he would take the long acting morphine so that he does not need to take so much of IR morphine

## 2018-02-15 ENCOUNTER — Inpatient Hospital Stay: Payer: Medicaid Other | Attending: Hematology and Oncology

## 2018-02-15 ENCOUNTER — Other Ambulatory Visit: Payer: Self-pay

## 2018-02-15 ENCOUNTER — Inpatient Hospital Stay: Payer: Medicaid Other

## 2018-02-15 VITALS — BP 123/81 | HR 91 | Temp 98.9°F | Resp 18 | Wt 158.4 lb

## 2018-02-15 DIAGNOSIS — K5900 Constipation, unspecified: Secondary | ICD-10-CM | POA: Insufficient documentation

## 2018-02-15 DIAGNOSIS — Z923 Personal history of irradiation: Secondary | ICD-10-CM | POA: Insufficient documentation

## 2018-02-15 DIAGNOSIS — C321 Malignant neoplasm of supraglottis: Secondary | ICD-10-CM

## 2018-02-15 DIAGNOSIS — G893 Neoplasm related pain (acute) (chronic): Secondary | ICD-10-CM | POA: Diagnosis not present

## 2018-02-15 DIAGNOSIS — Z5111 Encounter for antineoplastic chemotherapy: Secondary | ICD-10-CM | POA: Insufficient documentation

## 2018-02-15 LAB — CBC WITH DIFFERENTIAL (CANCER CENTER ONLY)
BASOS ABS: 0 10*3/uL (ref 0.0–0.1)
Basophils Relative: 0 %
EOS ABS: 0 10*3/uL (ref 0.0–0.5)
EOS PCT: 0 %
HCT: 37.5 % — ABNORMAL LOW (ref 38.4–49.9)
HEMOGLOBIN: 12.2 g/dL — AB (ref 13.0–17.1)
Lymphocytes Relative: 5 %
Lymphs Abs: 0.9 10*3/uL (ref 0.9–3.3)
MCH: 30.7 pg (ref 27.2–33.4)
MCHC: 32.5 g/dL (ref 32.0–36.0)
MCV: 94.5 fL (ref 79.3–98.0)
Monocytes Absolute: 0.2 10*3/uL (ref 0.1–0.9)
Monocytes Relative: 1 %
NEUTROS PCT: 94 %
Neutro Abs: 16.1 10*3/uL — ABNORMAL HIGH (ref 1.5–6.5)
PLATELETS: 191 10*3/uL (ref 140–400)
RBC: 3.97 MIL/uL — ABNORMAL LOW (ref 4.20–5.82)
RDW: 16.6 % — ABNORMAL HIGH (ref 11.0–14.6)
WBC Count: 17.3 10*3/uL — ABNORMAL HIGH (ref 4.0–10.3)

## 2018-02-15 LAB — CMP (CANCER CENTER ONLY)
ALT: 29 U/L (ref 0–55)
AST: 14 U/L (ref 5–34)
Albumin: 3.3 g/dL — ABNORMAL LOW (ref 3.5–5.0)
Alkaline Phosphatase: 62 U/L (ref 40–150)
Anion gap: 10 (ref 3–11)
BILIRUBIN TOTAL: 0.3 mg/dL (ref 0.2–1.2)
BUN: 20 mg/dL (ref 7–26)
CO2: 28 mmol/L (ref 22–29)
CREATININE: 0.73 mg/dL (ref 0.70–1.30)
Calcium: 9.5 mg/dL (ref 8.4–10.4)
Chloride: 101 mmol/L (ref 98–109)
Glucose, Bld: 119 mg/dL (ref 70–140)
POTASSIUM: 3.9 mmol/L (ref 3.5–5.1)
Sodium: 139 mmol/L (ref 136–145)
TOTAL PROTEIN: 6.5 g/dL (ref 6.4–8.3)

## 2018-02-15 MED ORDER — HEPARIN SOD (PORK) LOCK FLUSH 100 UNIT/ML IV SOLN
500.0000 [IU] | Freq: Once | INTRAVENOUS | Status: AC
  Administered 2018-02-15: 500 [IU]
  Filled 2018-02-15: qty 5

## 2018-02-15 MED ORDER — PROCHLORPERAZINE EDISYLATE 5 MG/ML IJ SOLN
10.0000 mg | Freq: Once | INTRAMUSCULAR | Status: AC
  Administered 2018-02-15: 10 mg via INTRAVENOUS

## 2018-02-15 MED ORDER — PROCHLORPERAZINE EDISYLATE 5 MG/ML IJ SOLN
INTRAMUSCULAR | Status: AC
  Filled 2018-02-15: qty 2

## 2018-02-15 MED ORDER — SODIUM CHLORIDE 0.9% FLUSH
10.0000 mL | Freq: Once | INTRAVENOUS | Status: AC
  Administered 2018-02-15: 10 mL
  Filled 2018-02-15: qty 10

## 2018-02-15 MED ORDER — METHOTREXATE SODIUM (PF) CHEMO INJECTION 250 MG/10ML
40.1500 mg/m2 | Freq: Once | INTRAMUSCULAR | Status: AC
  Administered 2018-02-15: 75 mg via INTRAVENOUS
  Filled 2018-02-15: qty 3

## 2018-02-15 NOTE — Patient Instructions (Signed)
[facility] Discharge Instructions for Patients Receiving Chemotherapy  Today you received the following chemotherapy agents Methotrexate  To help prevent nausea and vomiting after your treatment, we encourage you to take your nausea medication as directed.   If you develop nausea and vomiting that is not controlled by your nausea medication, call the clinic.   BELOW ARE SYMPTOMS THAT SHOULD BE REPORTED IMMEDIATELY:  *FEVER GREATER THAN 100.5 F  *CHILLS WITH OR WITHOUT FEVER  NAUSEA AND VOMITING THAT IS NOT CONTROLLED WITH YOUR NAUSEA MEDICATION  *UNUSUAL SHORTNESS OF BREATH  *UNUSUAL BRUISING OR BLEEDING  TENDERNESS IN MOUTH AND THROAT WITH OR WITHOUT PRESENCE OF ULCERS  *URINARY PROBLEMS  *BOWEL PROBLEMS  UNUSUAL RASH Items with * indicate a potential emergency and should be followed up as soon as possible.  Feel free to call the clinic should you have any questions or concerns. The clinic phone number is [phone].  Please show the CHEMO ALERT CARD at check-in to the Emergency Department and triage nurse.   

## 2018-02-16 MED FILL — OMEPRAZOLE DR 40 MG CAPSULE: 40 | 30 days supply | Qty: 60 | Fill #2

## 2018-02-22 ENCOUNTER — Inpatient Hospital Stay: Payer: Medicaid Other

## 2018-02-22 ENCOUNTER — Encounter: Payer: Self-pay | Admitting: Hematology and Oncology

## 2018-02-22 ENCOUNTER — Inpatient Hospital Stay (HOSPITAL_BASED_OUTPATIENT_CLINIC_OR_DEPARTMENT_OTHER): Payer: Medicaid Other | Admitting: Hematology and Oncology

## 2018-02-22 ENCOUNTER — Telehealth: Payer: Self-pay | Admitting: Hematology and Oncology

## 2018-02-22 DIAGNOSIS — G893 Neoplasm related pain (acute) (chronic): Secondary | ICD-10-CM | POA: Diagnosis not present

## 2018-02-22 DIAGNOSIS — C321 Malignant neoplasm of supraglottis: Secondary | ICD-10-CM | POA: Diagnosis not present

## 2018-02-22 DIAGNOSIS — K5909 Other constipation: Secondary | ICD-10-CM | POA: Diagnosis not present

## 2018-02-22 DIAGNOSIS — Z5111 Encounter for antineoplastic chemotherapy: Secondary | ICD-10-CM | POA: Diagnosis not present

## 2018-02-22 LAB — CBC WITH DIFFERENTIAL (CANCER CENTER ONLY)
Basophils Absolute: 0 10*3/uL (ref 0.0–0.1)
Basophils Relative: 0 %
EOS PCT: 0 %
Eosinophils Absolute: 0 10*3/uL (ref 0.0–0.5)
HCT: 38.4 % (ref 38.4–49.9)
Hemoglobin: 12.4 g/dL — ABNORMAL LOW (ref 13.0–17.1)
LYMPHS ABS: 1.3 10*3/uL (ref 0.9–3.3)
LYMPHS PCT: 6 %
MCH: 30.8 pg (ref 27.2–33.4)
MCHC: 32.3 g/dL (ref 32.0–36.0)
MCV: 95.3 fL (ref 79.3–98.0)
MONO ABS: 0.4 10*3/uL (ref 0.1–0.9)
MONOS PCT: 2 %
Neutro Abs: 18.3 10*3/uL — ABNORMAL HIGH (ref 1.5–6.5)
Neutrophils Relative %: 92 %
Platelet Count: 214 10*3/uL (ref 140–400)
RBC: 4.03 MIL/uL — AB (ref 4.20–5.82)
RDW: 17.1 % — AB (ref 11.0–14.6)
WBC Count: 20 10*3/uL — ABNORMAL HIGH (ref 4.0–10.3)

## 2018-02-22 LAB — CMP (CANCER CENTER ONLY)
ALT: 31 U/L (ref 0–55)
AST: 16 U/L (ref 5–34)
Albumin: 3.3 g/dL — ABNORMAL LOW (ref 3.5–5.0)
Alkaline Phosphatase: 60 U/L (ref 40–150)
Anion gap: 10 (ref 3–11)
BUN: 21 mg/dL (ref 7–26)
CO2: 29 mmol/L (ref 22–29)
CREATININE: 0.71 mg/dL (ref 0.70–1.30)
Calcium: 9.5 mg/dL (ref 8.4–10.4)
Chloride: 101 mmol/L (ref 98–109)
Glucose, Bld: 108 mg/dL (ref 70–140)
Potassium: 4 mmol/L (ref 3.5–5.1)
Sodium: 140 mmol/L (ref 136–145)
Total Bilirubin: 0.4 mg/dL (ref 0.2–1.2)
Total Protein: 6.7 g/dL (ref 6.4–8.3)

## 2018-02-22 MED ORDER — SODIUM CHLORIDE 0.9% FLUSH
10.0000 mL | Freq: Once | INTRAVENOUS | Status: AC
Start: 2018-02-22 — End: 2018-02-22
  Administered 2018-02-22: 10 mL
  Filled 2018-02-22: qty 10

## 2018-02-22 MED ORDER — MORPHINE SULFATE 30 MG PO TABS: 90.0000 mg | ORAL_TABLET | ORAL | 0 refills | Status: DC | PRN

## 2018-02-22 MED ORDER — PROCHLORPERAZINE EDISYLATE 5 MG/ML IJ SOLN
INTRAMUSCULAR | Status: AC
  Filled 2018-02-22: qty 2

## 2018-02-22 MED ORDER — PROCHLORPERAZINE EDISYLATE 5 MG/ML IJ SOLN
10.0000 mg | Freq: Once | INTRAMUSCULAR | Status: AC
  Administered 2018-02-22: 10 mg via INTRAVENOUS

## 2018-02-22 MED ORDER — METHOTREXATE SODIUM (PF) CHEMO INJECTION 250 MG/10ML
40.1000 mg/m2 | Freq: Once | INTRAMUSCULAR | Status: AC
  Administered 2018-02-22: 75 mg via INTRAVENOUS
  Filled 2018-02-22: qty 3

## 2018-02-22 MED ORDER — HEPARIN SOD (PORK) LOCK FLUSH 100 UNIT/ML IV SOLN
500.0000 [IU] | Freq: Once | INTRAVENOUS | Status: AC
  Administered 2018-02-22: 500 [IU]
  Filled 2018-02-22: qty 5

## 2018-02-22 MED ORDER — SODIUM CHLORIDE 0.9 % IJ SOLN
10.0000 mL | INTRAMUSCULAR | Status: DC | PRN
  Administered 2018-02-22: 10 mL
  Filled 2018-02-22: qty 10

## 2018-02-22 MED FILL — MORPHINE SULFATE IR 30 MG T: 30 | 5 days supply | Qty: 90 | Fill #0

## 2018-02-22 NOTE — Assessment & Plan Note (Signed)
He has recurrent, severe constipation We discussed aggressive laxative management

## 2018-02-22 NOTE — Assessment & Plan Note (Signed)
The dexamethasone has helped with his pain He will continue taking current prescription pain medicine I have printed him multiple prescriptions I warned him about risk of sedation, nausea and constipation.

## 2018-02-22 NOTE — Telephone Encounter (Signed)
Gave patient AVS and calendar of upcoming march and April appointments.

## 2018-02-22 NOTE — Progress Notes (Signed)
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  Other constipation He has recurrent, severe constipation We discussed aggressive laxative management   No orders of the defined types were placed in this encounter.   INTERVAL HISTORY: Please see below for problem oriented charting. He returns for further follow-up He continues to have severe pain near the tracheostomy site He denies recent infection He continued to take high-dose steroids for pain along with morphine sulfate He denies nausea.  He has significant constipation but is using laxatives He is compliant taking folic acid.  He has intermittent bleeding from the tracheostomy site but not significant  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[place])   03/15/2015 - 03/17/2015 Hospital Admission    He was admitted to the hospital for evaluation of dysphagia, SOB, hemoptosis, hoarseness, 30-40 pound weight loss and worsening bilateral neck masses for 5 months      03/15/2015 Imaging    Ct showed extensive circumferential malignancy in the  hypopharyngeal/supraglottic region with regional LN metastases      03/16/2015 Procedure    He underwent ULTRASOUND-GUIDED BIOPSY OF LEFT CERVICAL LYMPH NODES      03/16/2015 Pathology Results    Accession: [id number] LN biopsy showed invasive squamous cell cancer      03/16/2015 Pathology Results    Accession: [id number] showed atypical squamous cells      03/25/2015 - 04/07/2015 Hospital Admission    He was admitted to the hospital and underwent tracheostomy placement, feeeding tube placement but subsequently left [facility]      03/26/2015 Surgery    He had multiple extraction of tooth numbers 1, 2, 5, 6, 7, 8, 9, 10, 11, 12, 13, 18, 19, 21, 22, 23, 24, 25, 26, 27, 28, and 29. and 4 Quadrants of alveoloplasty      03/26/2015 Surgery    He underwent tracheostomy      03/31/2015 Surgery    He had open gastrostomy tube placement by Dr. Donne Hazel      04/16/2015 - 05/18/2015 Radiation Therapy    Laryngopharynx and bilateral neck / 50 Gy in 20 fractions to gross disease, 45 Gy in 20 fractions to high risk nodal echelons  Beams/energy: Helical IMRT / 6 MV photons      04/16/2015 Procedure    Fluoroscopic reposition of the 26 French gastrostomy confirmed back in the stomach,      07/03/2015 Procedure    IR performed replacement of gastrostomy tube with a new 31 French balloon retention tube      10/01/2015 Imaging    PEt scan showed persistent hypermetabolism within the primary supraglottic laryngeal tumor and within right retropharyngeal, bilateral level II and left level IV cervical  nodal metastases      10/28/2015 Procedure    He had placement of PICC line. The IR was not able to place PORT due to suspected upper respiratory infection      11/13/2015 - 03/21/2016 Chemotherapy    He received 5FU, carboplatin chemo with weekly Erbitux      11/19/2015 Surgery    Gastrostomy tube replaced.      11/30/2015 Procedure    Placement of right jugular port-a-cath.      11/30/2015 Procedure     PICC removed.      12/30/2015 Imaging    PET CT showed positive response to Rx      02/26/2016 Procedure    He underwent direct laryngoscopy with biopsy. Esophageal dilatation.       02/26/2016 Pathology Results    Repeat biopsy of supraglottis showed persistent disease      02/29/2016 Procedure    Gastrostomy tube exchanged.      03/28/2016 PET scan    Hypermetabolic tissue in the posterior RIGHT hypopharynx is similar in pattern to PET-CT of 12/30/2015 but increased in metabolic activity.2. Hypermetabolic tissue / lymph nodes in the LEFT supraclavicular nodal station are in a similar pattern       04/04/2016 - 11/29/2016 Chemotherapy    He started on palliative chemotherapy with pembrolizumab      05/03/2016 Imaging    MRI head is negative      06/02/2016 Imaging    Mild improvement of diffuse pharyngeal and supraglottic edema.Increased asymmetry of right-sided hypopharyngeal/supraglottic softtissue compared to the prior neck CT with FDG uptake in this region on prior PET-CT. Residual/recurrent tumor is possible      08/09/2016 Procedure    IR placed new 20 French percutaneous gastrostomy tube.      08/26/2016 Imaging    Ct neck showed unchanged diffuse pharyngeal and supraglottic edema as well as asymmetric soft tissue thickening on the right. Slightly decreased size of some left level II lymph nodes. Unchanged lymphadenopathy elsewhere in the neck. Decreased size of thyroid mass. No evidence of metastatic cancer to the chest      12/13/2016 Imaging    Ct chest showed no evidence of thoracic metastatic disease or primary thoracic malignancy.      12/13/2016 Imaging    Ct neck showed progression of of RIGHT supraglottic mass compared with priors, approximate size 24 x 27 x 27 mm. Extension caudally along the RIGHT area epiglottic fold with increasing mass effect on the airway. Tracheostomy satisfactory position. Stable to slightly improved malignant adenopathy.       12/26/2016 - 12/25/2017 Chemotherapy    He received chemotherapy with modified schedule of reduced dose Taxol       02/10/2017 Imaging    Very similar appearance to the study of January. Right sided supraglottic to glottic mass is similar, perhaps a few mm smaller. Bilateral enlarged nodes appear the same. No progressive finding      02/10/2017 Imaging    No evidence of metastatic disease in the abdomen/pelvis. Percutaneous gastrostomy in satisfactory position. Cholelithiasis, without associated inflammatory changes.      06/06/2017 Imaging    CT neck: Supraglottic tumor on the right is mildly smaller now measuring 23 x 16 mm. Improvement in bilateral cervical adenopathy. No new adenopathy identified      09/06/2017 Imaging    1. Essentially stable post treatment CT appearance of the neck since June (see #3). 2. Residual right supraglottic soft tissue asymmetry and indistinct 16 mm right level  II nodal tissue is superimposed on post treatment changes including diffuse pharyngeal mucosal space soft tissue thickening. 3. A 15 mm round low-density mass in the left thyroid region is slowly enlarging and favored to be a thyroid nodule rather than on necrotic lymph node. Attention on follow-up Neck CT versus dedicated Thyroid Ultrasound recommended.      12/04/2017 Imaging    1. Interval enlargement of right level II and left level IV lymph nodes most consistent with progressive metastatic disease. 2. Further enlargement of posterior left thyroid region mass also concerning for progressive metastatic disease (either nodal or extranodal) rather than a benign thyroid nodule. 3. Stable to minimal increase of the residual right supraglottic masslike soft tissue.      01/10/2018 -  Chemotherapy    The patient had methotrexate for chemotherapy treatment.         REVIEW OF SYSTEMS:   Constitutional: Denies fevers, chills or abnormal weight loss Eyes: Denies blurriness of vision Respiratory: Denies  cough, dyspnea or wheezes Cardiovascular: Denies palpitation, chest discomfort or lower extremity swelling Skin: Denies abnormal skin rashes Lymphatics: Denies new lymphadenopathy or easy bruising Neurological:Denies numbness, tingling or new weaknesses Behavioral/Psych: Mood is stable, no new changes  All other systems were reviewed with the patient and are negative.  I have reviewed the past medical history, past surgical history, social history and family history with the patient and they are unchanged from previous note.  ALLERGIES:  is allergic to aspirin.  MEDICATIONS:  Current Outpatient Medications  Medication Sig Dispense Refill  . dexamethasone (DECADRON) 4 MG tablet TAKE 1 TABLET BY MOUTH DAILY. 30 tablet 1  . fluticasone (FLONASE) 50 MCG/ACT nasal spray Place 2 sprays into both nostrils daily. 16 g 2  . folic acid (FOLVITE) 1 MG tablet Take 1 tablet (1 mg total) by mouth daily. 30 tablet 9  . gabapentin (NEURONTIN) 300 MG capsule Place 1 capsule (300 mg total) 3 (three) times daily into feeding tube. 90 capsule 11  . lactulose (CHRONULAC) 10 GM/15ML solution TAKE 15 MLS (10 G TOTAL) BY MOUTH 3 TIMES DAILY. 473 mL 2  . levothyroxine (SYNTHROID, LEVOTHROID) 100 MCG tablet TAKE 1 TABLET BY MOUTH ONCE DAILY BEFORE BREAKFAST 30 tablet 9  . morphine (MS CONTIN) 100 MG 12 hr tablet Take 1 tablet (100 mg total) by mouth every 8 (eight) hours. 90 tablet 0  . morphine (MSIR) 30 MG tablet Take 3 tablets (90 mg total) by mouth every 4 (four) hours as needed for severe pain. 90 tablet 0  . Nutritional Supplements (FEEDING SUPPLEMENT, JEVITY 1.5 CAL/FIBER,) LIQD Give 1 can Osmolite 1.2 + 1 can of Jevity 1.5 QID via PEG with 60 cc free water before and after bolus. Flush with 240 cc free water BID between feedings. 948 mL   . omeprazole (PRILOSEC) 40 MG capsule Take 1 capsule (40 mg total) by mouth daily. 90 capsule 3  . ondansetron (ZOFRAN) 8 MG tablet TAKE 1 TABLET BY MOUTH EVERY 8 HOURS AS  NEEDED FOR NAUSEA 60 tablet 3  . sodium chloride irrigation 0.9 % irrigation USE TO CLEAN AROUND TRACH AND PERFORM TRACH CARE ONCE DAILY AND AS NEEDED 1000 mL 11  . triamcinolone (NASACORT AQ) 55 MCG/ACT AERO nasal inhaler Place 2 sprays into the nose daily. 1 Inhaler 12   Current Facility-Administered Medications  Medication Dose Route Frequency Provider Last Rate Last Dose  . guaiFENesin-dextromethorphan (ROBITUSSIN DM) 100-10 MG/5ML syrup 10 mL  10 mL Oral Q4H PRN [name], MD  10 mL at 01/09/17 1427   Facility-Administered Medications Ordered in Other Visits  Medication Dose Route Frequency Provider Last Rate Last Dose  . 0.9 %  sodium chloride infusion   Intravenous Once Alvy Bimler, Brook Geraci, MD      . alteplase (CATHFLO ACTIVASE) injection 2 mg  2 mg Intracatheter Once PRN Alvy Bimler, Vasti Yagi, MD      . heparin lock flush 100 unit/mL  500 Units Intracatheter Once PRN Alvy Bimler, Jassen Sarver, MD      . heparin lock flush 100 unit/mL  250 Units Intracatheter Once PRN Alvy Bimler, Jousha Schwandt, MD      . ondansetron (ZOFRAN) injection 8 mg  8 mg Intravenous Once Drue Second R, NP      . sodium chloride 0.9 % injection 10 mL  10 mL Intracatheter PRN Alvy Bimler, Haizlee Henton, MD   10 mL at 01/02/17 1107  . sodium chloride 0.9 % injection 10 mL  10 mL Intracatheter PRN Alvy Bimler, Shantika Bermea, MD        PHYSICAL EXAMINATION: ECOG PERFORMANCE STATUS: 2 - Symptomatic, <50% confined to bed  Vitals:   02/22/18 0923  BP: 104/82  Pulse: (!) 109  Resp: 18  Temp: 99.7 F (37.6 C)  SpO2: 98%   Filed Weights   02/22/18 0923  Weight: 156 lb 3.2 oz (70.9 kg)    GENERAL:alert, no distress and comfortable SKIN: skin color, texture, turgor are normal, no rashes or significant lesions EYES: normal, Conjunctiva are pink and non-injected, sclera clear OROPHARYNX:no exudate, no erythema and lips, buccal mucosa, and tongue normal  NECK: Tracheostomy site looks okay without bleeding LYMPH:  no palpable lymphadenopathy in the cervical, axillary or  inguinal LUNGS: clear to auscultation and percussion with normal breathing effort HEART: regular rate & rhythm and no murmurs and no lower extremity edema ABDOMEN:abdomen soft, non-tender and normal bowel sounds Musculoskeletal:no cyanosis of digits and no clubbing  NEURO: alert & oriented x 3 with fluent speech, no focal motor/sensory deficits  LABORATORY DATA:  I have reviewed the data as listed    Component Value Date/Time   NA 140 02/22/2018 0855   NA 139 12/11/2017 0910   K 4.0 02/22/2018 0855   K 3.6 12/11/2017 0910   CL 101 02/22/2018 0855   CO2 29 02/22/2018 0855   CO2 27 12/11/2017 0910   GLUCOSE 108 02/22/2018 0855   GLUCOSE 119 12/11/2017 0910   BUN 21 02/22/2018 0855   BUN 11.8 12/11/2017 0910   CREATININE 0.71 02/22/2018 0855   CREATININE 0.7 12/11/2017 0910   CALCIUM 9.5 02/22/2018 0855   CALCIUM 9.5 12/11/2017 0910   PROT 6.7 02/22/2018 0855   PROT 7.2 12/11/2017 0910   ALBUMIN 3.3 (L) 02/22/2018 0855   ALBUMIN 3.7 12/11/2017 0910   AST 16 02/22/2018 0855   AST 11 12/11/2017 0910   ALT 31 02/22/2018 0855   ALT 12 12/11/2017 0910   ALKPHOS 60 02/22/2018 0855   ALKPHOS 75 12/11/2017 0910   BILITOT 0.4 02/22/2018 0855   BILITOT 0.42 12/11/2017 0910   GFRNONAA >60 02/22/2018 0855   GFRAA >60 02/22/2018 0855    No results found for: SPEP, UPEP  Lab Results  Component Value Date   WBC 20.0 (H) 02/22/2018   NEUTROABS 18.3 (H) 02/22/2018   HGB 12.2 (L) 02/08/2018   HCT 38.4 02/22/2018   MCV 95.3 02/22/2018   PLT 214 02/22/2018      Chemistry      Component Value Date/Time   NA 140 02/22/2018 0855   NA 139  12/11/2017 0910   K 4.0 02/22/2018 0855   K 3.6 12/11/2017 0910   CL 101 02/22/2018 0855   CO2 29 02/22/2018 0855   CO2 27 12/11/2017 0910   BUN 21 02/22/2018 0855   BUN 11.8 12/11/2017 0910   CREATININE 0.71 02/22/2018 0855   CREATININE 0.7 12/11/2017 0910      Component Value Date/Time   CALCIUM 9.5 02/22/2018 0855   CALCIUM 9.5  12/11/2017 0910   ALKPHOS 60 02/22/2018 0855   ALKPHOS 75 12/11/2017 0910   AST 16 02/22/2018 0855   AST 11 12/11/2017 0910   ALT 31 02/22/2018 0855   ALT 12 12/11/2017 0910   BILITOT 0.4 02/22/2018 0855   BILITOT 0.42 12/11/2017 0910       RADIOGRAPHIC STUDIES: I have personally reviewed the radiological images as listed and agreed with the findings in the report. Dg Abdomen Peg Tube Location  Result Date: 02/11/2018 CLINICAL DATA:  Replaced PEG tube with PEG tube injection. EXAM: ABDOMEN - 1 VIEW COMPARISON:  10/30/2017 FINDINGS: Contrast has been injected through the replaced gastrostomy tube. Contrast fills the stomach and extends into the duodenum. There is no contrast extravasation. The balloon of the gastrostomy tube projects in the gastric antrum. IMPRESSION: Well-positioned gastrostomy tube after replacement. Tube is ready for use. Electronically Signed   By: Lajean Manes M.D.   On: 02/11/2018 15:15    All questions were answered. The patient knows to call the clinic with any problems, questions or concerns. No barriers to learning was detected.  I spent 15 minutes counseling the patient face to face. The total time spent in the appointment was 20 minutes and more than 50% was on counseling and review of test results  [name], MD 02/22/2018 2:22 PM

## 2018-02-22 NOTE — Assessment & Plan Note (Signed)
We will continue chemotherapy today without dose adjustment The plan is to give him treatment on days 1, 8, 15 and a rest day 22 He is reminded to take folic acid supplementation while on treatment Plan for minimum 3 cycles of treatment before repeat imaging study

## 2018-02-22 NOTE — Patient Instructions (Signed)
[place] Discharge Instructions for Patients Receiving Chemotherapy  Today you received the following chemotherapy agents: Methotrexate (PF).  To help prevent nausea and vomiting after your treatment, we encourage you to take your nausea medication as prescribed.  If you develop nausea and vomiting that is not controlled by your nausea medication, call the clinic.   BELOW ARE SYMPTOMS THAT SHOULD BE REPORTED IMMEDIATELY:  *FEVER GREATER THAN 100.5 F  *CHILLS WITH OR WITHOUT FEVER  NAUSEA AND VOMITING THAT IS NOT CONTROLLED WITH YOUR NAUSEA MEDICATION  *UNUSUAL SHORTNESS OF BREATH  *UNUSUAL BRUISING OR BLEEDING  TENDERNESS IN MOUTH AND THROAT WITH OR WITHOUT PRESENCE OF ULCERS  *URINARY PROBLEMS  *BOWEL PROBLEMS  UNUSUAL RASH Items with * indicate a potential emergency and should be followed up as soon as possible.  Feel free to call the clinic should you have any questions or concerns. The clinic phone number is [phone].  Please show the [facility] at check-in to the Emergency Department and triage nurse.

## 2018-03-01 MED FILL — SODIUM CHLORIDE 0.9% IRRIG.: 0.9 | 30 days supply | Qty: 1000 | Fill #3

## 2018-03-01 MED FILL — ONDANSETRON HCL 8 MG TABS: 8 | 20 days supply | Qty: 60 | Fill #3

## 2018-03-01 MED FILL — DEXAMETHASONE 4 MG TABLET: 4 | 30 days supply | Qty: 30 | Fill #0

## 2018-03-01 MED FILL — FOLIC ACID 1 MG TABLET: 1 | 90 days supply | Qty: 90 | Fill #2

## 2018-03-08 ENCOUNTER — Inpatient Hospital Stay: Payer: Medicaid Other

## 2018-03-08 ENCOUNTER — Other Ambulatory Visit: Payer: Self-pay

## 2018-03-08 VITALS — BP 110/73 | HR 92 | Temp 98.4°F | Resp 20

## 2018-03-08 DIAGNOSIS — C321 Malignant neoplasm of supraglottis: Secondary | ICD-10-CM

## 2018-03-08 DIAGNOSIS — G893 Neoplasm related pain (acute) (chronic): Secondary | ICD-10-CM

## 2018-03-08 DIAGNOSIS — Z5111 Encounter for antineoplastic chemotherapy: Secondary | ICD-10-CM | POA: Diagnosis not present

## 2018-03-08 LAB — CBC WITH DIFFERENTIAL (CANCER CENTER ONLY)
BASOS ABS: 0 10*3/uL (ref 0.0–0.1)
Basophils Relative: 0 %
EOS ABS: 0 10*3/uL (ref 0.0–0.5)
EOS PCT: 0 %
HCT: 37.6 % — ABNORMAL LOW (ref 38.4–49.9)
HEMOGLOBIN: 12.5 g/dL — AB (ref 13.0–17.1)
Lymphocytes Relative: 2 %
Lymphs Abs: 0.4 10*3/uL — ABNORMAL LOW (ref 0.9–3.3)
MCH: 31.1 pg (ref 27.2–33.4)
MCHC: 33.3 g/dL (ref 32.0–36.0)
MCV: 93.4 fL (ref 79.3–98.0)
Monocytes Absolute: 1 10*3/uL — ABNORMAL HIGH (ref 0.1–0.9)
Monocytes Relative: 6 %
NEUTROS PCT: 92 %
Neutro Abs: 15.7 10*3/uL — ABNORMAL HIGH (ref 1.5–6.5)
PLATELETS: 216 10*3/uL (ref 140–400)
RBC: 4.02 MIL/uL — AB (ref 4.20–5.82)
RDW: 17.8 % — ABNORMAL HIGH (ref 11.0–14.6)
WBC: 17.1 10*3/uL — AB (ref 4.0–10.3)

## 2018-03-08 LAB — CMP (CANCER CENTER ONLY)
ALT: 33 U/L (ref 0–55)
AST: 13 U/L (ref 5–34)
Albumin: 2.9 g/dL — ABNORMAL LOW (ref 3.5–5.0)
Alkaline Phosphatase: 67 U/L (ref 40–150)
Anion gap: 9 (ref 3–11)
BILIRUBIN TOTAL: 0.3 mg/dL (ref 0.2–1.2)
BUN: 19 mg/dL (ref 7–26)
CHLORIDE: 103 mmol/L (ref 98–109)
CO2: 28 mmol/L (ref 22–29)
CREATININE: 0.67 mg/dL — AB (ref 0.70–1.30)
Calcium: 9.6 mg/dL (ref 8.4–10.4)
Glucose, Bld: 129 mg/dL (ref 70–140)
POTASSIUM: 4 mmol/L (ref 3.5–5.1)
Sodium: 140 mmol/L (ref 136–145)
TOTAL PROTEIN: 6.7 g/dL (ref 6.4–8.3)

## 2018-03-08 LAB — TSH: TSH: 0.109 u[IU]/mL — AB (ref 0.320–4.118)

## 2018-03-08 MED ORDER — HYDROMORPHONE HCL 2 MG/ML IJ SOLN
2.0000 mg | INTRAMUSCULAR | Status: AC
  Administered 2018-03-08: 2 mg via INTRAVENOUS

## 2018-03-08 MED ORDER — PROCHLORPERAZINE EDISYLATE 5 MG/ML IJ SOLN
10.0000 mg | Freq: Once | INTRAMUSCULAR | Status: AC
  Administered 2018-03-08: 10 mg via INTRAVENOUS

## 2018-03-08 MED ORDER — HEPARIN SOD (PORK) LOCK FLUSH 100 UNIT/ML IV SOLN
500.0000 [IU] | Freq: Once | INTRAVENOUS | Status: AC
  Administered 2018-03-08: 500 [IU]
  Filled 2018-03-08: qty 5

## 2018-03-08 MED ORDER — PROCHLORPERAZINE EDISYLATE 5 MG/ML IJ SOLN
INTRAMUSCULAR | Status: AC
  Filled 2018-03-08: qty 2

## 2018-03-08 MED ORDER — HYDROMORPHONE HCL 2 MG/ML IJ SOLN
INTRAMUSCULAR | Status: AC
  Filled 2018-03-08: qty 1

## 2018-03-08 MED ORDER — MORPHINE SULFATE 30 MG PO TABS: 90.0000 mg | ORAL_TABLET | ORAL | 0 refills | Status: DC | PRN

## 2018-03-08 MED ORDER — METHOTREXATE SODIUM (PF) CHEMO INJECTION 250 MG/10ML
40.0800 mg/m2 | Freq: Once | INTRAMUSCULAR | Status: AC
  Administered 2018-03-08: 75 mg via INTRAVENOUS
  Filled 2018-03-08: qty 3

## 2018-03-08 MED ORDER — SODIUM CHLORIDE 0.9% FLUSH
10.0000 mL | Freq: Once | INTRAVENOUS | Status: AC
  Administered 2018-03-08: 10 mL
  Filled 2018-03-08: qty 10

## 2018-03-08 MED ORDER — LEVOTHYROXINE SODIUM 75 MCG PO TABS: 75.0000 ug | ORAL_TABLET | Freq: Every day | ORAL | 1 refills | Status: AC

## 2018-03-08 MED FILL — LEVOTHYROXINE 75 MCG TABLET: 75 | 30 days supply | Qty: 30 | Fill #0

## 2018-03-08 MED FILL — MORPHINE SULFATE IR 30 MG T: 30 | 5 days supply | Qty: 90 | Fill #0

## 2018-03-08 NOTE — Patient Instructions (Signed)
[place] Discharge Instructions for Patients Receiving Chemotherapy  Today you received the following chemotherapy agents: Methotrexate (PF).  To help prevent nausea and vomiting after your treatment, we encourage you to take your nausea medication as prescribed.  If you develop nausea and vomiting that is not controlled by your nausea medication, call the clinic.   BELOW ARE SYMPTOMS THAT SHOULD BE REPORTED IMMEDIATELY:  *FEVER GREATER THAN 100.5 F  *CHILLS WITH OR WITHOUT FEVER  NAUSEA AND VOMITING THAT IS NOT CONTROLLED WITH YOUR NAUSEA MEDICATION  *UNUSUAL SHORTNESS OF BREATH  *UNUSUAL BRUISING OR BLEEDING  TENDERNESS IN MOUTH AND THROAT WITH OR WITHOUT PRESENCE OF ULCERS  *URINARY PROBLEMS  *BOWEL PROBLEMS  UNUSUAL RASH Items with * indicate a potential emergency and should be followed up as soon as possible.  Feel free to call the clinic should you have any questions or concerns. The clinic phone number is [phone].  Please show the [facility] at check-in to the Emergency Department and triage nurse.

## 2018-03-14 ENCOUNTER — Telehealth: Payer: Self-pay

## 2018-03-14 NOTE — Telephone Encounter (Signed)
Received VM from pt's sister "[name] Mahabir"regarding she has some "generalized questions"  "is aware of HIPAA" said she may be with him tomorrow and might stop by office.  I did not call pt's sister back as I don't see permission on pt's chart to speak to her.  I did make Dr Alvy Bimler aware of her VM.

## 2018-03-15 ENCOUNTER — Inpatient Hospital Stay: Payer: Medicaid Other | Attending: Hematology and Oncology

## 2018-03-15 ENCOUNTER — Telehealth: Payer: Self-pay | Admitting: *Deleted

## 2018-03-15 ENCOUNTER — Inpatient Hospital Stay: Payer: Medicaid Other

## 2018-03-15 VITALS — BP 117/71 | HR 100 | Temp 98.8°F | Resp 20

## 2018-03-15 DIAGNOSIS — C321 Malignant neoplasm of supraglottis: Secondary | ICD-10-CM

## 2018-03-15 DIAGNOSIS — M542 Cervicalgia: Secondary | ICD-10-CM | POA: Diagnosis not present

## 2018-03-15 DIAGNOSIS — E43 Unspecified severe protein-calorie malnutrition: Secondary | ICD-10-CM | POA: Insufficient documentation

## 2018-03-15 DIAGNOSIS — Z79899 Other long term (current) drug therapy: Secondary | ICD-10-CM | POA: Diagnosis not present

## 2018-03-15 DIAGNOSIS — Z930 Tracheostomy status: Secondary | ICD-10-CM | POA: Diagnosis not present

## 2018-03-15 DIAGNOSIS — G893 Neoplasm related pain (acute) (chronic): Secondary | ICD-10-CM | POA: Insufficient documentation

## 2018-03-15 DIAGNOSIS — R1310 Dysphagia, unspecified: Secondary | ICD-10-CM | POA: Diagnosis not present

## 2018-03-15 DIAGNOSIS — Z5111 Encounter for antineoplastic chemotherapy: Secondary | ICD-10-CM | POA: Diagnosis present

## 2018-03-15 DIAGNOSIS — Z66 Do not resuscitate: Secondary | ICD-10-CM | POA: Diagnosis not present

## 2018-03-15 LAB — CMP (CANCER CENTER ONLY)
ALBUMIN: 2.7 g/dL — AB (ref 3.5–5.0)
ALK PHOS: 69 U/L (ref 40–150)
ALT: 40 U/L (ref 0–55)
AST: 17 U/L (ref 5–34)
Anion gap: 9 (ref 3–11)
BUN: 20 mg/dL (ref 7–26)
CO2: 30 mmol/L — ABNORMAL HIGH (ref 22–29)
CREATININE: 0.65 mg/dL — AB (ref 0.70–1.30)
Calcium: 9.3 mg/dL (ref 8.4–10.4)
Chloride: 100 mmol/L (ref 98–109)
GFR, Est AFR Am: >60 mL/min (ref >60)
GFR, Estimated: >60 mL/min (ref >60)
GLUCOSE: 142 mg/dL — AB (ref 70–140)
POTASSIUM: 3.9 mmol/L (ref 3.5–5.1)
Sodium: 139 mmol/L (ref 136–145)
Total Protein: 6.3 g/dL — ABNORMAL LOW (ref 6.4–8.3)

## 2018-03-15 LAB — CBC WITH DIFFERENTIAL (CANCER CENTER ONLY)
BASOS ABS: 0 10*3/uL (ref 0.0–0.1)
Basophils Relative: 0 %
Eosinophils Absolute: 0 10*3/uL (ref 0.0–0.5)
Eosinophils Relative: 0 %
HEMATOCRIT: 37.3 % — AB (ref 38.4–49.9)
HEMOGLOBIN: 12.3 g/dL — AB (ref 13.0–17.1)
LYMPHS PCT: 6 %
Lymphs Abs: 1.1 10*3/uL (ref 0.9–3.3)
MCH: 31.1 pg (ref 27.2–33.4)
MCHC: 33 g/dL (ref 32.0–36.0)
MCV: 94.4 fL (ref 79.3–98.0)
MONO ABS: 0.4 10*3/uL (ref 0.1–0.9)
Monocytes Relative: 2 %
NEUTROS ABS: 17.7 10*3/uL — AB (ref 1.5–6.5)
NEUTROS PCT: 92 %
Platelet Count: 228 10*3/uL (ref 140–400)
RBC: 3.95 MIL/uL — AB (ref 4.20–5.82)
RDW: 17.1 % — AB (ref 11.0–14.6)
WBC Count: 19.2 10*3/uL — ABNORMAL HIGH (ref 4.0–10.3)

## 2018-03-15 MED ORDER — PROCHLORPERAZINE EDISYLATE 5 MG/ML IJ SOLN
10.0000 mg | Freq: Once | INTRAMUSCULAR | Status: AC
  Administered 2018-03-15: 10 mg via INTRAVENOUS

## 2018-03-15 MED ORDER — METHOTREXATE SODIUM (PF) CHEMO INJECTION 250 MG/10ML
40.1000 mg/m2 | Freq: Once | INTRAMUSCULAR | Status: AC
  Administered 2018-03-15: 75 mg via INTRAVENOUS
  Filled 2018-03-15: qty 3

## 2018-03-15 MED ORDER — SODIUM CHLORIDE 0.9% FLUSH
10.0000 mL | Freq: Once | INTRAVENOUS | Status: AC
  Administered 2018-03-15: 10 mL
  Filled 2018-03-15: qty 10

## 2018-03-15 MED ORDER — PROCHLORPERAZINE EDISYLATE 5 MG/ML IJ SOLN
INTRAMUSCULAR | Status: AC
Start: 2018-03-15 — End: 2018-03-15
  Filled 2018-03-15: qty 2

## 2018-03-15 MED ORDER — SODIUM CHLORIDE 0.9% FLUSH
10.0000 mL | INTRAVENOUS | Status: DC | PRN
  Administered 2018-03-15: 10 mL via INTRAVENOUS
  Filled 2018-03-15: qty 10

## 2018-03-15 MED ORDER — HEPARIN SOD (PORK) LOCK FLUSH 100 UNIT/ML IV SOLN
500.0000 [IU] | Freq: Once | INTRAVENOUS | Status: AC
  Administered 2018-03-15: 500 [IU] via INTRAVENOUS
  Filled 2018-03-15: qty 5

## 2018-03-15 NOTE — Telephone Encounter (Signed)
Oncology Nurse Navigator Documentation  Returned VMM x 2 to pt's sister.  She stated understanding I am unable to share information regarding his care at [facility].  She indicated she is seeing him a couple times a week and over weekends.  She expressed ongoing concern about his welfare, declining health.  I encouraged her to continue providing support as he allows.  She expressed appreciation for my return call.  Gayleen Orem, RN, BSN Head & Neck Oncology Nurse [place] at [place] [phone]

## 2018-03-15 NOTE — Patient Instructions (Signed)
[place] Discharge Instructions for Patients Receiving Chemotherapy  Today you received the following chemotherapy agents methotrexate   To help prevent nausea and vomiting after your treatment, we encourage you to take your nausea medication as directed  If you develop nausea and vomiting that is not controlled by your nausea medication, call the clinic.   BELOW ARE SYMPTOMS THAT SHOULD BE REPORTED IMMEDIATELY:  *FEVER GREATER THAN 100.5 F  *CHILLS WITH OR WITHOUT FEVER  NAUSEA AND VOMITING THAT IS NOT CONTROLLED WITH YOUR NAUSEA MEDICATION  *UNUSUAL SHORTNESS OF BREATH  *UNUSUAL BRUISING OR BLEEDING  TENDERNESS IN MOUTH AND THROAT WITH OR WITHOUT PRESENCE OF ULCERS  *URINARY PROBLEMS  *BOWEL PROBLEMS  UNUSUAL RASH Items with * indicate a potential emergency and should be followed up as soon as possible.  Feel free to call the clinic you have any questions or concerns. The clinic phone number is [phone].

## 2018-03-22 ENCOUNTER — Encounter: Payer: Self-pay | Admitting: Hematology and Oncology

## 2018-03-22 ENCOUNTER — Inpatient Hospital Stay: Payer: Medicaid Other

## 2018-03-22 ENCOUNTER — Telehealth: Payer: Self-pay | Admitting: Hematology and Oncology

## 2018-03-22 ENCOUNTER — Inpatient Hospital Stay (HOSPITAL_BASED_OUTPATIENT_CLINIC_OR_DEPARTMENT_OTHER): Payer: Medicaid Other | Admitting: Hematology and Oncology

## 2018-03-22 DIAGNOSIS — Z7189 Other specified counseling: Secondary | ICD-10-CM

## 2018-03-22 DIAGNOSIS — G893 Neoplasm related pain (acute) (chronic): Secondary | ICD-10-CM

## 2018-03-22 DIAGNOSIS — E43 Unspecified severe protein-calorie malnutrition: Secondary | ICD-10-CM

## 2018-03-22 DIAGNOSIS — C321 Malignant neoplasm of supraglottis: Secondary | ICD-10-CM

## 2018-03-22 DIAGNOSIS — Z930 Tracheostomy status: Secondary | ICD-10-CM | POA: Diagnosis not present

## 2018-03-22 DIAGNOSIS — Z5111 Encounter for antineoplastic chemotherapy: Secondary | ICD-10-CM | POA: Diagnosis not present

## 2018-03-22 LAB — CBC WITH DIFFERENTIAL (CANCER CENTER ONLY)
BASOS PCT: 0 %
Basophils Absolute: 0 10*3/uL (ref 0.0–0.1)
Eosinophils Absolute: 0 10*3/uL (ref 0.0–0.5)
Eosinophils Relative: 0 %
HEMATOCRIT: 36.8 % — AB (ref 38.4–49.9)
Hemoglobin: 12.4 g/dL — ABNORMAL LOW (ref 13.0–17.1)
LYMPHS ABS: 1 10*3/uL (ref 0.9–3.3)
Lymphocytes Relative: 5 %
MCH: 31.8 pg (ref 27.2–33.4)
MCHC: 33.7 g/dL (ref 32.0–36.0)
MCV: 94.4 fL (ref 79.3–98.0)
MONOS PCT: 1 %
Monocytes Absolute: 0.3 10*3/uL (ref 0.1–0.9)
NEUTROS ABS: 18.8 10*3/uL — AB (ref 1.5–6.5)
Neutrophils Relative %: 94 %
Platelet Count: 231 10*3/uL (ref 140–400)
RBC: 3.9 MIL/uL — ABNORMAL LOW (ref 4.20–5.82)
RDW: 17.6 % — ABNORMAL HIGH (ref 11.0–14.6)
WBC Count: 20.1 10*3/uL — ABNORMAL HIGH (ref 4.0–10.3)

## 2018-03-22 LAB — CMP (CANCER CENTER ONLY)
ALT: 49 U/L (ref 0–55)
AST: 16 U/L (ref 5–34)
Albumin: 2.7 g/dL — ABNORMAL LOW (ref 3.5–5.0)
Alkaline Phosphatase: 69 U/L (ref 40–150)
Anion gap: 10 (ref 3–11)
BUN: 20 mg/dL (ref 7–26)
CHLORIDE: 100 mmol/L (ref 98–109)
CO2: 29 mmol/L (ref 22–29)
CREATININE: 0.67 mg/dL — AB (ref 0.70–1.30)
Calcium: 9.2 mg/dL (ref 8.4–10.4)
GFR, Est AFR Am: >60 mL/min (ref >60)
GFR, Estimated: >60 mL/min (ref >60)
GLUCOSE: 171 mg/dL — AB (ref 70–140)
Potassium: 3.7 mmol/L (ref 3.5–5.1)
SODIUM: 139 mmol/L (ref 136–145)
Total Bilirubin: 0.2 mg/dL (ref 0.2–1.2)
Total Protein: 6.1 g/dL — ABNORMAL LOW (ref 6.4–8.3)

## 2018-03-22 LAB — TSH: TSH: 1.104 u[IU]/mL (ref 0.320–4.118)

## 2018-03-22 MED ORDER — METHOTREXATE SODIUM (PF) CHEMO INJECTION 250 MG/10ML
40.1000 mg/m2 | Freq: Once | INTRAMUSCULAR | Status: AC
  Administered 2018-03-22: 75 mg via INTRAVENOUS
  Filled 2018-03-22: qty 3

## 2018-03-22 MED ORDER — PROCHLORPERAZINE EDISYLATE 5 MG/ML IJ SOLN
10.0000 mg | Freq: Once | INTRAMUSCULAR | Status: AC
  Administered 2018-03-22: 10 mg via INTRAVENOUS

## 2018-03-22 MED ORDER — HEPARIN SOD (PORK) LOCK FLUSH 100 UNIT/ML IV SOLN
500.0000 [IU] | Freq: Once | INTRAVENOUS | Status: AC | PRN
  Administered 2018-03-22: 500 [IU]
  Filled 2018-03-22: qty 5

## 2018-03-22 MED ORDER — MORPHINE SULFATE 30 MG PO TABS: 90.0000 mg | ORAL_TABLET | ORAL | 0 refills | Status: DC | PRN

## 2018-03-22 MED ORDER — SODIUM CHLORIDE 0.9% FLUSH
10.0000 mL | Freq: Once | INTRAVENOUS | Status: AC
  Administered 2018-03-22: 10 mL
  Filled 2018-03-22: qty 10

## 2018-03-22 MED ORDER — PROCHLORPERAZINE EDISYLATE 5 MG/ML IJ SOLN
INTRAMUSCULAR | Status: AC
  Filled 2018-03-22: qty 2

## 2018-03-22 MED ORDER — HEPARIN SOD (PORK) LOCK FLUSH 100 UNIT/ML IV SOLN
500.0000 [IU] | Freq: Once | INTRAVENOUS | Status: DC
  Filled 2018-03-22: qty 5

## 2018-03-22 MED FILL — MORPHINE SULFATE IR 30 MG T: 30 | 5 days supply | Qty: 90 | Fill #0

## 2018-03-22 NOTE — Assessment & Plan Note (Signed)
We had numerous goals of care discussions in the past He has agreed to DNR The patient does not want [place] to engage his sister even though she has been calling the office for update Today, I spoke with him again and the patient does not want me to contact his sister or discuss any of his status with her

## 2018-03-22 NOTE — Assessment & Plan Note (Signed)
The dexamethasone has helped with his pain He will continue taking current prescription pain medicine He disliked taking extended release morphine sulfate due to sedation I have printed him multiple prescriptions I warned him about risk of sedation, nausea and constipation.

## 2018-03-22 NOTE — Assessment & Plan Note (Signed)
He has progressive weight loss and protein calorie malnutrition He will continue feeding via feeding tube

## 2018-03-22 NOTE — Assessment & Plan Note (Signed)
He has been evaluated by ENT with plan to change his tracheostomy on a regular basis Today, there is no signs of infection or bleeding from the tracheostomy site

## 2018-03-22 NOTE — Assessment & Plan Note (Signed)
He tolerated treatment very poorly Overall, his symptoms of severe neck pain could be related to disease progression We have numerous goals of care discussion in the past He wants to proceed with treatment today as scheduled After that, I plan to repeat imaging study to assess response to treatment As discussed previously, if he does not response to chemotherapy, he needs to be referred to palliative care and hospice

## 2018-03-22 NOTE — Telephone Encounter (Signed)
Patient scheduled per 4/11 los. Will receive updated copy of schedule in treatment area.

## 2018-03-22 NOTE — Patient Instructions (Signed)
[facility] Discharge Instructions for Patients Receiving Chemotherapy  Today you received the following chemotherapy agents Methotrexate  To help prevent nausea and vomiting after your treatment, we encourage you to take your nausea medication as directed.   If you develop nausea and vomiting that is not controlled by your nausea medication, call the clinic.   BELOW ARE SYMPTOMS THAT SHOULD BE REPORTED IMMEDIATELY:  *FEVER GREATER THAN 100.5 F  *CHILLS WITH OR WITHOUT FEVER  NAUSEA AND VOMITING THAT IS NOT CONTROLLED WITH YOUR NAUSEA MEDICATION  *UNUSUAL SHORTNESS OF BREATH  *UNUSUAL BRUISING OR BLEEDING  TENDERNESS IN MOUTH AND THROAT WITH OR WITHOUT PRESENCE OF ULCERS  *URINARY PROBLEMS  *BOWEL PROBLEMS  UNUSUAL RASH Items with * indicate a potential emergency and should be followed up as soon as possible.  Feel free to call the clinic should you have any questions or concerns. The clinic phone number is [phone].  Please show the CHEMO ALERT CARD at check-in to the Emergency Department and triage nurse.   

## 2018-03-22 NOTE — Progress Notes (Signed)
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[name]) He tolerated treatment very poorly Overall, his symptoms of severe neck pain could be related to disease progression We have numerous goals of care discussion in the past He wants to proceed with treatment today as scheduled After that, I plan to repeat imaging study to assess response to treatment As discussed previously, if he does not response to chemotherapy, he needs to be referred to palliative care and hospice  Cancer associated pain The dexamethasone has helped with his pain He will continue taking current prescription pain medicine He disliked taking extended release morphine sulfate due to sedation I have printed him multiple prescriptions I warned him about risk of sedation, nausea and constipation.  Protein-calorie malnutrition, severe ([place]) He has progressive weight loss and protein calorie malnutrition He will continue feeding via feeding tube  Tracheostomy status ([place]) He has been evaluated by ENT with plan to change his tracheostomy on a regular basis Today, there is no signs of infection or bleeding from the tracheostomy site  Goals of care, counseling/discussion We had numerous goals of care discussions in the past He has agreed to DNR The patient does not want [place] to engage his sister even though she has been calling the office for update Today, I spoke with him again and the patient does not want me to contact his sister or discuss any of his status with her   Orders Placed This Encounter  Procedures  . CT Soft Tissue Neck W Contrast    Standing Status:   Future    Standing Expiration Date:    03/22/2019    Order Specific Question:   If indicated for the ordered procedure, I authorize the administration of contrast media per Radiology protocol    Answer:   Yes    Order Specific Question:   Preferred imaging location?    Answer:   [facility]    Order Specific Question:   Radiology Contrast Protocol - do NOT remove file path    Answer:   [email or web address]    INTERVAL HISTORY: Please see below for problem oriented charting. He returns today for chemotherapy He has progressive weight loss and poorly controlled pain He has been complaining of intermittent bleeding from tracheostomy site site and continues for close follow-up with ENT He denies recent infection, fever or chills He denies recent nausea or vomiting  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[name])   03/15/2015 - 03/17/2015 Hospital Admission    He was admitted to the hospital for evaluation of dysphagia, SOB, hemoptosis, hoarseness, 30-40 pound weight loss and worsening bilateral neck masses for 5 months      03/15/2015 Imaging    Ct showed extensive circumferential malignancy in the hypopharyngeal/supraglottic region with regional LN metastases      03/16/2015 Procedure    He underwent ULTRASOUND-GUIDED BIOPSY OF LEFT CERVICAL LYMPH NODES      03/16/2015 Pathology Results    Accession: [id number] LN biopsy showed invasive squamous cell cancer      03/16/2015 Pathology Results    Accession: [id number] showed atypical squamous cells      03/25/2015 - 04/07/2015 Hospital Admission    He was admitted to the hospital  and underwent tracheostomy placement, feeeding tube placement but subsequently left [facility]      03/26/2015 Surgery    He had multiple extraction of tooth numbers 1, 2, 5, 6, 7, 8, 9, 10, 11, 12, 13, 18, 19, 21, 22, 23, 24, 25, 26, 27, 28, and 29. and 4 Quadrants of alveoloplasty      03/26/2015 Surgery    He underwent tracheostomy       03/31/2015 Surgery    He had open gastrostomy tube placement by Dr. Donne Hazel      04/16/2015 - 05/18/2015 Radiation Therapy    Laryngopharynx and bilateral neck / 50 Gy in 20 fractions to gross disease, 45 Gy in 20 fractions to high risk nodal echelons  Beams/energy: Helical IMRT / 6 MV photons      04/16/2015 Procedure    Fluoroscopic reposition of the 18 French gastrostomy confirmed back in the stomach,      07/03/2015 Procedure    IR performed replacement of gastrostomy tube with a new 37 French balloon retention tube      10/01/2015 Imaging    PEt scan showed persistent hypermetabolism within the primary supraglottic laryngeal tumor and within right retropharyngeal, bilateral level II and left level IV cervical nodal metastases      10/28/2015 Procedure    He had placement of PICC line. The IR was not able to place PORT due to suspected upper respiratory infection      11/13/2015 - 03/21/2016 Chemotherapy    He received 5FU, carboplatin chemo with weekly Erbitux      11/19/2015 Surgery    Gastrostomy tube replaced.      11/30/2015 Procedure    Placement of right jugular port-a-cath.      11/30/2015 Procedure    PICC removed.      12/30/2015 Imaging    PET CT showed positive response to Rx      02/26/2016 Procedure    He underwent direct laryngoscopy with biopsy. Esophageal dilatation.       02/26/2016 Pathology Results    Repeat biopsy of supraglottis showed persistent disease      02/29/2016 Procedure    Gastrostomy tube exchanged.      03/28/2016 PET scan    Hypermetabolic tissue in the posterior RIGHT hypopharynx is similar in pattern to PET-CT of 12/30/2015 but increased in metabolic activity.2. Hypermetabolic tissue / lymph nodes in the LEFT supraclavicular nodal station are in a similar pattern       04/04/2016 - 11/29/2016 Chemotherapy    He started on palliative chemotherapy with pembrolizumab      05/03/2016 Imaging    MRI head is negative       06/02/2016 Imaging    Mild improvement of diffuse pharyngeal and supraglottic edema.Increased asymmetry of right-sided hypopharyngeal/supraglottic softtissue compared to the prior neck CT with FDG uptake in this region on prior PET-CT. Residual/recurrent tumor is possible      08/09/2016 Procedure    IR placed new 20 French percutaneous gastrostomy tube.      08/26/2016 Imaging    Ct neck showed unchanged diffuse pharyngeal and supraglottic edema as well as asymmetric soft tissue thickening on the right. Slightly decreased size of some left level II lymph nodes. Unchanged lymphadenopathy elsewhere in the neck. Decreased size of thyroid mass. No evidence of metastatic cancer to the chest      12/13/2016 Imaging    Ct chest showed no evidence of thoracic metastatic disease or primary thoracic malignancy.      12/13/2016  Imaging    Ct neck showed progression of of RIGHT supraglottic mass compared with priors, approximate size 24 x 27 x 27 mm. Extension caudally along the RIGHT area epiglottic fold with increasing mass effect on the airway. Tracheostomy satisfactory position. Stable to slightly improved malignant adenopathy.      12/26/2016 - 12/25/2017 Chemotherapy    He received chemotherapy with modified schedule of reduced dose Taxol       02/10/2017 Imaging    Very similar appearance to the study of January. Right sided supraglottic to glottic mass is similar, perhaps a few mm smaller. Bilateral enlarged nodes appear the same. No progressive finding      02/10/2017 Imaging    No evidence of metastatic disease in the abdomen/pelvis. Percutaneous gastrostomy in satisfactory position. Cholelithiasis, without associated inflammatory changes.      06/06/2017 Imaging    CT neck: Supraglottic tumor on the right is mildly smaller now measuring 23 x 16 mm. Improvement in bilateral cervical adenopathy. No new adenopathy identified      09/06/2017 Imaging    1. Essentially stable post treatment CT  appearance of the neck since June (see #3). 2. Residual right supraglottic soft tissue asymmetry and indistinct 16 mm right level II nodal tissue is superimposed on post treatment changes including diffuse pharyngeal mucosal space soft tissue thickening. 3. A 15 mm round low-density mass in the left thyroid region is slowly enlarging and favored to be a thyroid nodule rather than on necrotic lymph node. Attention on follow-up Neck CT versus dedicated Thyroid Ultrasound recommended.      12/04/2017 Imaging    1. Interval enlargement of right level II and left level IV lymph nodes most consistent with progressive metastatic disease. 2. Further enlargement of posterior left thyroid region mass also concerning for progressive metastatic disease (either nodal or extranodal) rather than a benign thyroid nodule. 3. Stable to minimal increase of the residual right supraglottic masslike soft tissue.      01/10/2018 -  Chemotherapy    The patient had methotrexate for chemotherapy treatment.         REVIEW OF SYSTEMS:   Constitutional: Denies fevers, chills or abnormal weight loss Eyes: Denies blurriness of vision Ears, nose, mouth, throat, and face: Denies mucositis or sore throat Respiratory: Denies cough, dyspnea or wheezes Cardiovascular: Denies palpitation, chest discomfort or lower extremity swelling Gastrointestinal:  Denies nausea, heartburn or change in bowel habits Skin: Denies abnormal skin rashes Lymphatics: Denies new lymphadenopathy or easy bruising Neurological:Denies numbness, tingling or new weaknesses Behavioral/Psych: Mood is stable, no new changes  All other systems were reviewed with the patient and are negative.  I have reviewed the past medical history, past surgical history, social history and family history with the patient and they are unchanged from previous note.  ALLERGIES:  is allergic to aspirin.  MEDICATIONS:  Current Outpatient Medications  Medication Sig  Dispense Refill  . dexamethasone (DECADRON) 4 MG tablet TAKE 1 TABLET BY MOUTH DAILY. 30 tablet 1  . fluticasone (FLONASE) 50 MCG/ACT nasal spray Place 2 sprays into both nostrils daily. 16 g 2  . folic acid (FOLVITE) 1 MG tablet Take 1 tablet (1 mg total) by mouth daily. 30 tablet 9  . gabapentin (NEURONTIN) 300 MG capsule Place 1 capsule (300 mg total) 3 (three) times daily into feeding tube. 90 capsule 11  . lactulose (CHRONULAC) 10 GM/15ML solution TAKE 15 MLS (10 G TOTAL) BY MOUTH 3 TIMES DAILY. 473 mL 2  . levothyroxine (SYNTHROID) 75  MCG tablet Take 1 tablet (75 mcg total) by mouth daily before breakfast. 30 tablet 1  . morphine (MS CONTIN) 100 MG 12 hr tablet Take 1 tablet (100 mg total) by mouth every 8 (eight) hours. 90 tablet 0  . morphine (MSIR) 30 MG tablet Take 3 tablets (90 mg total) by mouth every 4 (four) hours as needed for severe pain. 90 tablet 0  . Nutritional Supplements (FEEDING SUPPLEMENT, JEVITY 1.5 CAL/FIBER,) LIQD Give 1 can Osmolite 1.2 + 1 can of Jevity 1.5 QID via PEG with 60 cc free water before and after bolus. Flush with 240 cc free water BID between feedings. 948 mL   . omeprazole (PRILOSEC) 40 MG capsule Take 1 capsule (40 mg total) by mouth daily. 90 capsule 3  . ondansetron (ZOFRAN) 8 MG tablet TAKE 1 TABLET BY MOUTH EVERY 8 HOURS AS NEEDED FOR NAUSEA 60 tablet 3  . sodium chloride irrigation 0.9 % irrigation USE TO CLEAN AROUND TRACH AND PERFORM TRACH CARE ONCE DAILY AND AS NEEDED 1000 mL 11  . triamcinolone (NASACORT AQ) 55 MCG/ACT AERO nasal inhaler Place 2 sprays into the nose daily. 1 Inhaler 12   Current Facility-Administered Medications  Medication Dose Route Frequency Provider Last Rate Last Dose  . guaiFENesin-dextromethorphan (ROBITUSSIN DM) 100-10 MG/5ML syrup 10 mL  10 mL Oral Q4H PRN Alvy Bimler, Alba Kriesel, MD   10 mL at 01/09/17 1427   Facility-Administered Medications Ordered in Other Visits  Medication Dose Route Frequency Provider Last Rate Last Dose  .  0.9 %  sodium chloride infusion   Intravenous Once Alvy Bimler, [employer], MD      . alteplase (CATHFLO ACTIVASE) injection 2 mg  2 mg Intracatheter Once PRN Alvy Bimler, Joclynn Lumb, MD      . heparin lock flush 100 unit/mL  500 Units Intracatheter Once PRN Alvy Bimler, Jnyah Brazee, MD      . heparin lock flush 100 unit/mL  250 Units Intracatheter Once PRN Alvy Bimler, Sedric Guia, MD      . heparin lock flush 100 unit/mL  500 Units Intracatheter Once Alvy Bimler, Genola Yuille, MD      . ondansetron (ZOFRAN) injection 8 mg  8 mg Intravenous Once Drue Second R, NP      . sodium chloride 0.9 % injection 10 mL  10 mL Intracatheter PRN Alvy Bimler, Pharrell Ledford, MD   10 mL at 01/02/17 1107  . sodium chloride 0.9 % injection 10 mL  10 mL Intracatheter PRN Alvy Bimler, Ladiamond Gallina, MD        PHYSICAL EXAMINATION: ECOG PERFORMANCE STATUS: 2 - Symptomatic, <50% confined to bed  Vitals:   03/22/18 0815  BP: 137/80  Pulse: 100  Resp: 18  Temp: 99.2 F (37.3 C)  SpO2: 98%   Filed Weights   03/22/18 0815  Weight: 149 lb 14.4 oz (68 kg)    GENERAL:alert, no distress and comfortable SKIN: skin color, texture, turgor are normal, no rashes or significant lesions EYES: normal, Conjunctiva are pink and non-injected, sclera clear OROPHARYNX:no exudate, no erythema and lips, buccal mucosa, and tongue normal  NECK: Tracheostomy site looks clean without signs of bleeding LYMPH:  no palpable lymphadenopathy in the cervical, axillary or inguinal LUNGS: Bilateral wheezes noted HEART: regular rate & rhythm and no murmurs and no lower extremity edema ABDOMEN:abdomen soft, non-tender and normal bowel sounds Musculoskeletal:no cyanosis of digits and no clubbing  NEURO: alert & oriented x 3 with fluent speech, no focal motor/sensory deficits  LABORATORY DATA:  I have reviewed the data as listed    Component Value  Date/Time   NA 139 03/22/2018 0744   NA 139 12/11/2017 0910   K 3.7 03/22/2018 0744   K 3.6 12/11/2017 0910   CL 100 03/22/2018 0744   CO2 29 03/22/2018 0744   CO2 27  12/11/2017 0910   GLUCOSE 171 (H) 03/22/2018 0744   GLUCOSE 119 12/11/2017 0910   BUN 20 03/22/2018 0744   BUN 11.8 12/11/2017 0910   CREATININE 0.67 (L) 03/22/2018 0744   CREATININE 0.7 12/11/2017 0910   CALCIUM 9.2 03/22/2018 0744   CALCIUM 9.5 12/11/2017 0910   PROT 6.1 (L) 03/22/2018 0744   PROT 7.2 12/11/2017 0910   ALBUMIN 2.7 (L) 03/22/2018 0744   ALBUMIN 3.7 12/11/2017 0910   AST 16 03/22/2018 0744   AST 11 12/11/2017 0910   ALT 49 03/22/2018 0744   ALT 12 12/11/2017 0910   ALKPHOS 69 03/22/2018 0744   ALKPHOS 75 12/11/2017 0910   BILITOT 0.2 03/22/2018 0744   BILITOT 0.42 12/11/2017 0910   GFRNONAA >60 03/22/2018 0744   GFRAA >60 03/22/2018 0744    No results found for: SPEP, UPEP  Lab Results  Component Value Date   WBC 20.1 (H) 03/22/2018   NEUTROABS 18.8 (H) 03/22/2018   HGB 12.2 (L) 02/08/2018   HCT 36.8 (L) 03/22/2018   MCV 94.4 03/22/2018   PLT 231 03/22/2018      Chemistry      Component Value Date/Time   NA 139 03/22/2018 0744   NA 139 12/11/2017 0910   K 3.7 03/22/2018 0744   K 3.6 12/11/2017 0910   CL 100 03/22/2018 0744   CO2 29 03/22/2018 0744   CO2 27 12/11/2017 0910   BUN 20 03/22/2018 0744   BUN 11.8 12/11/2017 0910   CREATININE 0.67 (L) 03/22/2018 0744   CREATININE 0.7 12/11/2017 0910      Component Value Date/Time   CALCIUM 9.2 03/22/2018 0744   CALCIUM 9.5 12/11/2017 0910   ALKPHOS 69 03/22/2018 0744   ALKPHOS 75 12/11/2017 0910   AST 16 03/22/2018 0744   AST 11 12/11/2017 0910   ALT 49 03/22/2018 0744   ALT 12 12/11/2017 0910   BILITOT 0.2 03/22/2018 0744   BILITOT 0.42 12/11/2017 0910      All questions were answered. The patient knows to call the clinic with any problems, questions or concerns. No barriers to learning was detected.  I spent 25 minutes counseling the patient face to face. The total time spent in the appointment was 40 minutes and more than 50% was on counseling and review of test results  [name],  MD 03/22/2018 10:46 AM

## 2018-03-24 ENCOUNTER — Emergency Department (HOSPITAL_COMMUNITY): Payer: Medicaid Other

## 2018-03-24 ENCOUNTER — Encounter (HOSPITAL_COMMUNITY): Payer: Self-pay | Admitting: Emergency Medicine

## 2018-03-24 ENCOUNTER — Inpatient Hospital Stay (HOSPITAL_COMMUNITY)
Admission: EM | Admit: 2018-03-24 | Discharge: 2018-03-26 | DRG: 189 | Disposition: A | Discharge status: Hospice | Payer: Medicaid Other | Attending: Internal Medicine | Admitting: Internal Medicine

## 2018-03-24 ENCOUNTER — Other Ambulatory Visit: Payer: Self-pay

## 2018-03-24 DIAGNOSIS — I10 Essential (primary) hypertension: Secondary | ICD-10-CM | POA: Diagnosis present

## 2018-03-24 DIAGNOSIS — J9601 Acute respiratory failure with hypoxia: Secondary | ICD-10-CM

## 2018-03-24 DIAGNOSIS — Z66 Do not resuscitate: Secondary | ICD-10-CM | POA: Diagnosis present

## 2018-03-24 DIAGNOSIS — E038 Other specified hypothyroidism: Secondary | ICD-10-CM | POA: Diagnosis present

## 2018-03-24 DIAGNOSIS — Z9221 Personal history of antineoplastic chemotherapy: Secondary | ICD-10-CM

## 2018-03-24 DIAGNOSIS — Z931 Gastrostomy status: Secondary | ICD-10-CM

## 2018-03-24 DIAGNOSIS — J9690 Respiratory failure, unspecified, unspecified whether with hypoxia or hypercapnia: Secondary | ICD-10-CM | POA: Diagnosis present

## 2018-03-24 DIAGNOSIS — R6510 Systemic inflammatory response syndrome (SIRS) of non-infectious origin without acute organ dysfunction: Secondary | ICD-10-CM | POA: Diagnosis not present

## 2018-03-24 DIAGNOSIS — Z515 Encounter for palliative care: Secondary | ICD-10-CM | POA: Diagnosis present

## 2018-03-24 DIAGNOSIS — Z886 Allergy status to analgesic agent status: Secondary | ICD-10-CM

## 2018-03-24 DIAGNOSIS — R0603 Acute respiratory distress: Secondary | ICD-10-CM

## 2018-03-24 DIAGNOSIS — Z8249 Family history of ischemic heart disease and other diseases of the circulatory system: Secondary | ICD-10-CM

## 2018-03-24 DIAGNOSIS — Z923 Personal history of irradiation: Secondary | ICD-10-CM

## 2018-03-24 DIAGNOSIS — Z9089 Acquired absence of other organs: Secondary | ICD-10-CM

## 2018-03-24 DIAGNOSIS — Z87891 Personal history of nicotine dependence: Secondary | ICD-10-CM

## 2018-03-24 DIAGNOSIS — C321 Malignant neoplasm of supraglottis: Secondary | ICD-10-CM | POA: Diagnosis present

## 2018-03-24 DIAGNOSIS — J9621 Acute and chronic respiratory failure with hypoxia: Secondary | ICD-10-CM | POA: Diagnosis present

## 2018-03-24 DIAGNOSIS — Z8521 Personal history of malignant neoplasm of larynx: Secondary | ICD-10-CM

## 2018-03-24 DIAGNOSIS — J9602 Acute respiratory failure with hypercapnia: Secondary | ICD-10-CM

## 2018-03-24 DIAGNOSIS — Z79890 Hormone replacement therapy (postmenopausal): Secondary | ICD-10-CM

## 2018-03-24 DIAGNOSIS — Z930 Tracheostomy status: Secondary | ICD-10-CM

## 2018-03-24 DIAGNOSIS — Z833 Family history of diabetes mellitus: Secondary | ICD-10-CM

## 2018-03-24 DIAGNOSIS — K219 Gastro-esophageal reflux disease without esophagitis: Secondary | ICD-10-CM | POA: Diagnosis present

## 2018-03-24 DIAGNOSIS — J9622 Acute and chronic respiratory failure with hypercapnia: Secondary | ICD-10-CM | POA: Diagnosis present

## 2018-03-24 DIAGNOSIS — Z85819 Personal history of malignant neoplasm of unspecified site of lip, oral cavity, and pharynx: Secondary | ICD-10-CM

## 2018-03-24 LAB — URINALYSIS, ROUTINE W REFLEX MICROSCOPIC
BILIRUBIN URINE: NEGATIVE
Bacteria, UA: NONE SEEN
GLUCOSE, UA: NEGATIVE mg/dL
Ketones, ur: 5 mg/dL — AB
Leukocytes, UA: NEGATIVE
Nitrite: NEGATIVE
PROTEIN: NEGATIVE mg/dL
Specific Gravity, Urine: 1.013 (ref 1.005–1.030)
Squamous Epithelial / LPF: NONE SEEN
pH: 7 (ref 5.0–8.0)

## 2018-03-24 LAB — CBC WITH DIFFERENTIAL/PLATELET
BASOS ABS: 0 10*3/uL (ref 0.0–0.1)
Basophils Relative: 0 %
Eosinophils Absolute: 0 10*3/uL (ref 0.0–0.7)
Eosinophils Relative: 0 %
HEMATOCRIT: 38.5 % — AB (ref 39.0–52.0)
HEMOGLOBIN: 13.1 g/dL (ref 13.0–17.0)
LYMPHS ABS: 0.5 10*3/uL — AB (ref 0.7–4.0)
LYMPHS PCT: 3 %
MCH: 32 pg (ref 26.0–34.0)
MCHC: 34 g/dL (ref 30.0–36.0)
MCV: 93.9 fL (ref 78.0–100.0)
Monocytes Absolute: 0.8 10*3/uL (ref 0.1–1.0)
Monocytes Relative: 5 %
NEUTROS ABS: 15.3 10*3/uL — AB (ref 1.7–7.7)
NEUTROS PCT: 92 %
PLATELETS: 254 10*3/uL (ref 150–400)
RBC: 4.1 MIL/uL — AB (ref 4.22–5.81)
RDW: 17.5 % — ABNORMAL HIGH (ref 11.5–15.5)
WBC: 16.6 10*3/uL — AB (ref 4.0–10.5)

## 2018-03-24 LAB — COMPREHENSIVE METABOLIC PANEL
ALK PHOS: 62 U/L (ref 38–126)
ALT: 41 U/L (ref 17–63)
AST: 19 U/L (ref 15–41)
Albumin: 3 g/dL — ABNORMAL LOW (ref 3.5–5.0)
Anion gap: 12 (ref 5–15)
BILIRUBIN TOTAL: 0.9 mg/dL (ref 0.3–1.2)
BUN: 18 mg/dL (ref 6–20)
CALCIUM: 8.9 mg/dL (ref 8.9–10.3)
CHLORIDE: 98 mmol/L — AB (ref 101–111)
CO2: 27 mmol/L (ref 22–32)
CREATININE: 0.6 mg/dL — AB (ref 0.61–1.24)
Glucose, Bld: 89 mg/dL (ref 65–99)
Potassium: 3.9 mmol/L (ref 3.5–5.1)
Sodium: 137 mmol/L (ref 135–145)
Total Protein: 6.7 g/dL (ref 6.5–8.1)

## 2018-03-24 LAB — BLOOD GAS, ARTERIAL
ACID-BASE EXCESS: 4 mmol/L — AB (ref 0.0–2.0)
Bicarbonate: 29.2 mmol/L — ABNORMAL HIGH (ref 20.0–28.0)
Drawn by: 308601
FIO2: 35
O2 Saturation: 98.5 %
PH ART: 7.386 (ref 7.350–7.450)
Patient temperature: 100.2
pCO2 arterial: 50.3 mmHg — ABNORMAL HIGH (ref 32.0–48.0)
pO2, Arterial: 158 mmHg — ABNORMAL HIGH (ref 83.0–108.0)

## 2018-03-24 LAB — I-STAT TROPONIN, ED: TROPONIN I, POC: 0 ng/mL (ref 0.00–0.08)

## 2018-03-24 LAB — I-STAT CG4 LACTIC ACID, ED
LACTIC ACID, VENOUS: 0.92 mmol/L (ref 0.5–1.9)
Lactic Acid, Venous: 1.19 mmol/L (ref 0.5–1.9)

## 2018-03-24 LAB — INFLUENZA PANEL BY PCR (TYPE A & B)
INFLBPCR: NEGATIVE
Influenza A By PCR: NEGATIVE

## 2018-03-24 MED ORDER — ROCURONIUM BROMIDE 50 MG/5ML IV SOLN
60.0000 mg | Freq: Once | INTRAVENOUS | Status: AC
  Administered 2018-03-24: 60 mg via INTRAVENOUS

## 2018-03-24 MED ORDER — IOPAMIDOL (ISOVUE-370) INJECTION 76%
100.0000 mL | Freq: Once | INTRAVENOUS | Status: AC | PRN
  Administered 2018-03-24: 100 mL via INTRAVENOUS

## 2018-03-24 MED ORDER — LEVOTHYROXINE SODIUM 75 MCG PO TABS
75.0000 ug | ORAL_TABLET | Freq: Every day | ORAL | Status: DC
  Filled 2018-03-24: qty 1

## 2018-03-24 MED ORDER — PROPOFOL 1000 MG/100ML IV EMUL
20.0000 ug/kg/min | Freq: Once | INTRAVENOUS | Status: AC
  Administered 2018-03-24: 20 ug/kg/min via INTRAVENOUS
  Filled 2018-03-24: qty 100

## 2018-03-24 MED ORDER — PANTOPRAZOLE SODIUM 40 MG PO TBEC
40.0000 mg | DELAYED_RELEASE_TABLET | Freq: Every day | ORAL | Status: DC
Start: 2018-03-25 — End: 2018-03-26
  Administered 2018-03-25 – 2018-03-26 (×2): 40 mg via ORAL
  Filled 2018-03-24 (×2): qty 1

## 2018-03-24 MED ORDER — DEXAMETHASONE SODIUM PHOSPHATE 10 MG/ML IJ SOLN
10.0000 mg | Freq: Once | INTRAMUSCULAR | Status: AC
  Administered 2018-03-24: 10 mg via INTRAVENOUS
  Filled 2018-03-24: qty 1

## 2018-03-24 MED ORDER — ONDANSETRON HCL 4 MG/2ML IJ SOLN
4.0000 mg | Freq: Four times a day (QID) | INTRAMUSCULAR | Status: DC | PRN
  Administered 2018-03-25: 4 mg via INTRAVENOUS
  Filled 2018-03-24: qty 2

## 2018-03-24 MED ORDER — PIPERACILLIN-TAZOBACTAM 3.375 G IVPB 30 MIN
3.3750 g | Freq: Once | INTRAVENOUS | Status: AC
Start: 2018-03-24 — End: 2018-03-24
  Administered 2018-03-24: 3.375 g via INTRAVENOUS
  Filled 2018-03-24: qty 50

## 2018-03-24 MED ORDER — FOLIC ACID 1 MG PO TABS: 1.0000 mg | ORAL_TABLET | Freq: Every day | ORAL | Status: DC

## 2018-03-24 MED ORDER — SODIUM CHLORIDE 0.9 % IV SOLN
INTRAVENOUS | Status: DC
  Administered 2018-03-25: 01:00:00 via INTRAVENOUS

## 2018-03-24 MED ORDER — DEXAMETHASONE 2 MG PO TABS
4.0000 mg | ORAL_TABLET | Freq: Every day | ORAL | Status: DC
  Administered 2018-03-25 – 2018-03-26 (×2): 4 mg
  Filled 2018-03-24 (×2): qty 2

## 2018-03-24 MED ORDER — IOPAMIDOL (ISOVUE-370) INJECTION 76%
INTRAVENOUS | Status: AC
  Filled 2018-03-24: qty 100

## 2018-03-24 MED ORDER — PROPOFOL 10 MG/ML IV BOLUS
40.0000 mg | Freq: Once | INTRAVENOUS | Status: AC
  Administered 2018-03-24: 40 mg via INTRAVENOUS

## 2018-03-24 MED ORDER — IPRATROPIUM-ALBUTEROL 0.5-2.5 (3) MG/3ML IN SOLN: 3.0000 mL | RESPIRATORY_TRACT | Status: DC | PRN

## 2018-03-24 MED ORDER — ACETAMINOPHEN 650 MG RE SUPP: 650.0000 mg | Freq: Four times a day (QID) | RECTAL | Status: DC | PRN

## 2018-03-24 MED ORDER — ACETAMINOPHEN 325 MG PO TABS: 650.0000 mg | ORAL_TABLET | Freq: Four times a day (QID) | ORAL | Status: DC | PRN

## 2018-03-24 MED ORDER — FLUTICASONE PROPIONATE 50 MCG/ACT NA SUSP
2.0000 | Freq: Every day | NASAL | Status: DC
  Administered 2018-03-25: 2 via NASAL
  Filled 2018-03-24: qty 16

## 2018-03-24 MED ORDER — VANCOMYCIN HCL IN DEXTROSE 1-5 GM/200ML-% IV SOLN
1000.0000 mg | Freq: Once | INTRAVENOUS | Status: AC
  Administered 2018-03-24: 1000 mg via INTRAVENOUS
  Filled 2018-03-24: qty 200

## 2018-03-24 MED ORDER — LORAZEPAM 2 MG/ML IJ SOLN
1.0000 mg | INTRAMUSCULAR | Status: DC | PRN
  Administered 2018-03-24: 1 mg via INTRAVENOUS
  Filled 2018-03-24: qty 1

## 2018-03-24 MED ORDER — ENOXAPARIN SODIUM 40 MG/0.4ML ~~LOC~~ SOLN
40.0000 mg | Freq: Every day | SUBCUTANEOUS | Status: DC
  Administered 2018-03-25 (×2): 40 mg via SUBCUTANEOUS
  Filled 2018-03-24 (×2): qty 0.4

## 2018-03-24 MED ORDER — SODIUM CHLORIDE 0.9 % IJ SOLN
INTRAMUSCULAR | Status: AC
  Filled 2018-03-24: qty 50

## 2018-03-24 MED ORDER — PROPOFOL 10 MG/ML IV BOLUS
40.0000 mg | Freq: Once | INTRAVENOUS | Status: AC
  Administered 2018-03-24: 40 mg via INTRAVENOUS
  Filled 2018-03-24: qty 20

## 2018-03-24 MED ORDER — GABAPENTIN 300 MG PO CAPS
300.0000 mg | ORAL_CAPSULE | Freq: Three times a day (TID) | ORAL | Status: DC
  Administered 2018-03-25 – 2018-03-26 (×5): 300 mg
  Filled 2018-03-24 (×5): qty 1

## 2018-03-24 MED ORDER — JEVITY 1.5 CAL/FIBER PO LIQD: 1000.0000 mL | ORAL | Status: DC

## 2018-03-24 MED ORDER — ONDANSETRON HCL 4 MG PO TABS: 4.0000 mg | ORAL_TABLET | Freq: Four times a day (QID) | ORAL | Status: DC | PRN

## 2018-03-24 MED ORDER — MORPHINE SULFATE 15 MG PO TABS
90.0000 mg | ORAL_TABLET | ORAL | Status: DC | PRN
  Administered 2018-03-25 (×2): 90 mg via ORAL
  Filled 2018-03-24 (×2): qty 6

## 2018-03-24 MED ORDER — MORPHINE SULFATE ER 100 MG PO TBCR
100.0000 mg | EXTENDED_RELEASE_TABLET | Freq: Three times a day (TID) | ORAL | Status: DC
  Filled 2018-03-24: qty 1

## 2018-03-24 NOTE — ED Notes (Signed)
Bed: WA20 Expected date:  Expected time:  Means of arrival:  Comments: 57 yo resp distress, trach, cough

## 2018-03-24 NOTE — Progress Notes (Signed)
Pt still having apnea during SBT.  RT will reassess in 30 mins for ATC trial.

## 2018-03-24 NOTE — ED Triage Notes (Signed)
Per EMS trach pt from extended stay with SOB ongoing for 2 days. Fever reported. Stoma looks very old. Lung sounds diminished throughout. 89% RA 99% on O2. Generalized throat pain. 113/87, 100, CBG 130, 18G LAC

## 2018-03-24 NOTE — ED Provider Notes (Signed)
[facility] DEPT Provider Note   CSN: [id number] Arrival date & time: 03/24/18  1514     History   Chief Complaint Chief Complaint  Patient presents with  . Respiratory Distress    HPI Kyle Alexander is a 57 y.o. male.  Complaint is difficulty breathing.  HPI: 57 year old male.  Originally diagnosed with supraglottic malignancy 2016.  Underwent radiation and multidose chemotherapy.  Started on hope was considered "palliative" chemotherapy in 917.  Has tracheostomy.  Has PEG tube.  Has Infuse-a-Port.  Most recent treatment is still palliative with methotrexate.  He follows with Dr. Alvy Bimler.   Per her most recent note there has been "multiple previous discussions regarding care".  He wants to proceed with aggressive care.  He does not wish to stop treatment.  He is DNR but is undergoing treatment as above.  He [place] in an extended stay motel.  He has a sister lives in [place] that is very involved in his care.  However he does not often want her involved in an attempt to do his care via medical transport to and from his appointments.  Reports increasing shortness of breath over the last several days.  He is felt like he has had fever.  He ultimately tells me that he had is not been above 101.  He states he is having more secretions.  He had a trace amount of blood from his trach.  He is self suctions his pharynx and his trach.  He takes 100% of his intake via his PEG tube feels like he has been keeping up.  He has not had vomiting.  Past Medical History:  Diagnosis Date  . Fatigue 03/28/2016  . GERD (gastroesophageal reflux disease)   . Heartburn symptom 10/19/2015  . Hypertension   . Insomnia 01/22/2016  . Nausea & vomiting 12/15/2015  . S/P radiation therapy 04/16/2015-05/18/2015   Laryngopharynx and bilateral neck / 50 Gy in 20 fractions to gross disease, 45 Gy in 20 fractions to high risk nodal echelons   . Seizures ([place])   . Shortness of breath  dyspnea   . Syncopal episodes 04/25/2016  . Throat cancer ([place]) 03/16/2015   left cervical lymph node / throat (2016)    Patient Active Problem List   Diagnosis Date Noted  . Goals of care, counseling/discussion 03/22/2018  . Other constipation 01/18/2018  . Bilateral leg edema 04/25/2017  . Dehydration 03/09/2017  . Abdominal pain 01/30/2017  . Weight loss 01/30/2017  . 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 DNR (do not resuscitate) 12/15/2016  . Acute bronchitis with chronic obstructive pulmonary disease (COPD) ([place]) 11/29/2016  . Acquired hypothyroidism 08/09/2016  . Back skin lesion 08/08/2016  . Syncopal episodes 04/25/2016  . Fatigue 03/28/2016  . 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 Cancer associated pain 02/19/2016  . Abscess of face 02/01/2016  . Insomnia 01/22/2016  . Pancytopenia due to antineoplastic chemotherapy ([place]) 01/22/2016  . Esophagitis, unspecified 01/22/2016  . Hemoptysis 01/22/2016  . Chemotherapy-induced nausea 01/01/2016  . Nausea & vomiting 12/15/2015  . Acneiform rash 12/11/2015  . Nasal drainage 10/30/2015  . Gastrostomy tube in place ([place]) 10/19/2015  . Heartburn symptom 10/19/2015  . Tracheostomy status ([place])   . Hypomagnesemia   . Tracheostomy care ([place])   . 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 Vitamin D deficiency 03/17/2015  . Folate deficiency 03/16/2015  . Protein-calorie malnutrition, severe ([place]) 03/16/2015  .  HTN (hypertension) 03/15/2015  . Tobacco abuse 03/15/2015  . Alcohol withdrawal seizure ([place]) 03/15/2015  . Prolonged Q-T interval on ECG 03/15/2015  . Aortic dilatation ([place]) 03/15/2015  . Thyroid nodule 03/15/2015  . Lymphadenopathy 03/15/2015  . Hypokalemia 03/15/2015  . B12 deficiency 03/15/2015  . Marijuana abuse 03/15/2015    Past Surgical History:  Procedure Laterality Date  . DIRECT LARYNGOSCOPY WITH BOTOX INJECTION  N/A 02/26/2016   Procedure: DIRECT LARYNGOSCOPY ESOPHAGOSCOPY DILATION  AND TRACH REPLACMENT;  Surgeon: [name], MD;  Location: [facility];  Service: ENT;  Laterality: N/A;  . ESOPHAGOSCOPY WITH DILITATION N/A 02/26/2016   Procedure: ESOPHAGOSCOPY WITH DILITATION, PHARNGEAL BIOPSY;  Surgeon: [name], MD;  Location: [facility];  Service: ENT;  Laterality: N/A;  . GASTROSTOMY N/A 03/31/2015   Procedure: OPEN GASTROSTOMY TUBE PLACEMENT;  Surgeon: Rolm Bookbinder, MD;  Location: [facility];  Service: General;  Laterality: N/A;  . HERNIA REPAIR     right groin  . IR GENERIC HISTORICAL  08/09/2016   IR GASTRIC TUBE PERC CHG W/O IMG GUIDE 08/09/2016 Jacqulynn Cadet, MD [facility]-INTER[facility] RAD  . IR GENERIC HISTORICAL  02/10/2017   IR [place] TUBE PERCUT W/FLUORO 02/10/2017 Sandi Mariscal, MD [facility]  . IR PATIENT EVAL TECH 0-60 MINS  04/12/2017  . IR [place] TUBE PERCUT W/FLUORO  10/30/2017  . KNEE SURGERY  1998   Left  . LYMPH NODE BIOPSY Left 03/16/15   left cervical, consistent with squamous cell carcinoma  . MULTIPLE EXTRACTIONS WITH ALVEOLOPLASTY N/A 03/26/2015   Procedure: EXTRACTION OF TOOTH #'S 1,2,5,6,7,8,9,10,11,12,13,18,19,21,22,23,24,25,26,27,28,29  WITH AVELOPLASTY;  Surgeon: Lenn Cal, DDS;  Location: [facility];  Service: Oral Surgery;  Laterality: N/A;  . TONSILLECTOMY    . TONSILLECTOMY AND ADENOIDECTOMY    . TOOTH EXTRACTION  03/26/2015   Procedure: [place];  Surgeon: [name], MD;  Location: [facility];  Service: ENT;;  . TRACHEOSTOMY TUBE PLACEMENT N/A 03/26/2015   Procedure: TRACHEOSTOMY ;  Surgeon: [name], MD;  Location: [facility];  Service: ENT;  Laterality: N/A;        Home Medications    Prior to Admission medications   Medication Sig Start Date End Date Taking? Authorizing Provider  dexamethasone (DECADRON) 4 MG tablet TAKE 1 TABLET BY MOUTH DAILY. 02/06/18   [name], MD  fluticasone (FLONASE) 50 MCG/ACT nasal spray  Place 2 sprays into both nostrils daily. 07/12/17   [name], MD  folic acid (FOLVITE) 1 MG tablet Take 1 tablet (1 mg total) by mouth daily. 01/03/18   [name], MD  gabapentin (NEURONTIN) 300 MG capsule Place 1 capsule (300 mg total) 3 (three) times daily into feeding tube. 10/30/17   [name], MD  lactulose (CHRONULAC) 10 GM/15ML solution TAKE 15 MLS (10 G TOTAL) BY MOUTH 3 TIMES DAILY. 01/22/18   [name], MD  levothyroxine (SYNTHROID) 75 MCG tablet Take 1 tablet (75 mcg total) by mouth daily before breakfast. 03/08/18   [name], MD  morphine (MS CONTIN) 100 MG 12 hr tablet Take 1 tablet (100 mg total) by mouth every 8 (eight) hours. 01/18/18   [name], MD  morphine (MSIR) 30 MG tablet Take 3 tablets (90 mg total) by mouth every 4 (four) hours as needed for severe pain. 03/22/18   [name], MD  Nutritional Supplements (FEEDING SUPPLEMENT, JEVITY 1.5 CAL/FIBER,) LIQD Give 1 can Osmolite 1.2 + 1 can of Jevity 1.5 QID via PEG with 60 cc free water before and  after bolus. Flush with 240 cc free water BID between feedings. 03/21/16   [name], MD  omeprazole (PRILOSEC) 40 MG capsule Take 1 capsule (40 mg total) by mouth daily. 05/04/17   Nelida Meuse III, MD  ondansetron (ZOFRAN) 8 MG tablet TAKE 1 TABLET BY MOUTH EVERY 8 HOURS AS NEEDED FOR NAUSEA 08/24/17   Gorsuch, Ni, MD  sodium chloride irrigation 0.9 % irrigation USE TO CLEAN AROUND TRACH AND PERFORM TRACH CARE ONCE DAILY AND AS NEEDED 04/04/17   [name], MD  triamcinolone (NASACORT AQ) 55 MCG/ACT AERO nasal inhaler Place 2 sprays into the nose daily. 10/10/16   [name], MD    Family History Family History  Problem Relation Age of Onset  . Heart disease Mother   . Diabetes Father   . Hypertension Father     Social History Social History   Tobacco Use  . Smoking status: Former Smoker    Packs/day: 0.00    Years: 35.00    Pack years: 0.00    Types: Cigarettes  . Smokeless tobacco: Former Systems developer    Types:  Chew  . Tobacco comment: 2-4 cig per day  Substance Use Topics  . Alcohol use: Yes    Alcohol/week: 18.0 oz    Types: 30 Standard drinks or equivalent per week    Comment: occasional  . Drug use: Yes    Types: Marijuana    Comment: occasional      Allergies   Aspirin   Review of Systems Review of Systems  Constitutional: Positive for fatigue and fever. Negative for appetite change, chills and diaphoresis.  HENT: Negative for mouth sores, sore throat and trouble swallowing.   Eyes: Negative for visual disturbance.  Respiratory: Positive for cough and shortness of breath. Negative for chest tightness and wheezing.   Cardiovascular: Negative for chest pain.  Gastrointestinal: Negative for abdominal distention, abdominal pain, diarrhea, nausea and vomiting.  Endocrine: Negative for polydipsia, polyphagia and polyuria.  Genitourinary: Negative for dysuria, frequency and hematuria.  Musculoskeletal: Negative for gait problem.  Skin: Negative for color change, pallor and rash.  Neurological: Negative for dizziness, syncope, light-headedness and headaches.  Hematological: Does not bruise/bleed easily.     Physical Exam Updated Vital Signs BP (!) 153/108   Pulse (!) 110   Temp 100.2 F (37.9 C) (Rectal)   Resp 15   Ht 5\' 7"  (1.702 m)   SpO2 99%   BMI 23.48 kg/m   Physical Exam  Constitutional: He is oriented to person, place, and time. No distress.  Thin frail.  Respiratory distress.  Tracheostomy.  HENT:  Head: Normocephalic.  Adjunctive are not pale.  Eyes: Pupils are equal, round, and reactive to light. Conjunctivae are normal. No scleral icterus.  No conjunctival injection.  Neck: Normal range of motion. Neck supple. No thyromegaly present.  Tracheostomy.  Size 4.  Marked tracheostomy "noisy respirations" with tachypnea and increased effort with increased work of breathing.  Granulation tissue adjacent his trach but no obvious infection.  Marked bulky adenopathy in  the neck and supraclavicular regions  Cardiovascular: Normal rate and regular rhythm. Exam reveals no gallop and no friction rub.  No murmur heard. Tachycardia 117  Pulmonary/Chest: Effort normal and breath sounds normal. No respiratory distress. He has no wheezes. He has no rales.  Marked transmitted upper airway sounds.  Globally diminished lung sounds.  Abdominal: Soft. Bowel sounds are normal. He exhibits no distension. There is no tenderness. There is no rebound.  Musculoskeletal:  Thin  frail.  Neurological: He is alert and oriented to person, place, and time.  Skin: Skin is warm and dry. No rash noted.     ED Treatments / Results  Labs (all labs ordered are listed, but only abnormal results are displayed) Labs Reviewed  CBC WITH DIFFERENTIAL/PLATELET - Abnormal; Notable for the following components:      Result Value   WBC 16.6 (*)    RBC 4.10 (*)    HCT 38.5 (*)    RDW 17.5 (*)    Neutro Abs 15.3 (*)    Lymphs Abs 0.5 (*)    All other components within normal limits  COMPREHENSIVE METABOLIC PANEL - Abnormal; Notable for the following components:   Chloride 98 (*)    Creatinine, Ser 0.60 (*)    Albumin 3.0 (*)    All other components within normal limits  BLOOD GAS, ARTERIAL - Abnormal; Notable for the following components:   pCO2 arterial 50.3 (*)    pO2, Arterial 158 (*)    Bicarbonate 29.2 (*)    Acid-Base Excess 4.0 (*)    All other components within normal limits  URINALYSIS, ROUTINE W REFLEX MICROSCOPIC - Abnormal; Notable for the following components:   Hgb urine dipstick SMALL (*)    Ketones, ur 5 (*)    All other components within normal limits  CULTURE, BLOOD (ROUTINE X 2)  CULTURE, BLOOD (ROUTINE X 2)  URINE CULTURE  INFLUENZA PANEL BY PCR (TYPE A & B)  I-STAT TROPONIN, ED  I-STAT CG4 LACTIC ACID, ED  I-STAT CG4 LACTIC ACID, ED    EKG None  Radiology Ct Soft Tissue Neck W Contrast  Result Date: 03/24/2018 CLINICAL DATA:  Throat pain EXAM: CT  NECK WITH CONTRAST TECHNIQUE: Multidetector CT imaging of the neck was performed using the standard protocol following the bolus administration of intravenous contrast. CONTRAST:  180mL ISOVUE-370 IOPAMIDOL (ISOVUE-370) INJECTION 76% COMPARISON:  12/04/2017 FINDINGS: Pharynx and larynx: Low-density mass centered at the larynx which measures up to 4.9 cm AP dimension. Mass grows upward into the glottis and anteriorly displaces the central compartment. Mass is indistinguishable from the esophageal verge and there is obstruction with secretions retained in the oropharynx and nasopharynx. Submucosal low-density thickening the level of the pharynx which may be related to inflammation or prior radiotherapy. Diffuse mucosal hyperenhancement. Salivary glands: Post treatment atrophy. No primary disease is seen. Adenopathy invades the right parotid tail. Thyroid: Atrophic with malignant invasion on the left. Lymph nodes: Previously identified necrotic adenopathy has progressed. Right upper jugular node has enlarged to 4.1 cm and invades the parotid tail. Low left jugular adenopathy has enlarged to 3.8 cm and invades the scalene musculature, in close proximity to the brachial plexus. Probable right lateral retropharyngeal adenopathy with ill-defined asymmetric masslike density. Vascular: No visible pseudoaneurysm. The lower left IJ is effaced by the adenopathy. The upper right IJ is occluded. Limited intracranial: Negative Visualized orbits: Negative Mastoids and visualized paranasal sinuses: Mild right mastoid opacification. Skeleton: No visible metastatic deposit. Upper chest: Centrilobular emphysema. IMPRESSION: 1. History of head and neck cancer with progression of residual disease in the central compartment and bilateral nodal neck. Central compartment tumor obstructs the larynx/pharynx and upper esophagus and there is retention of secretions to the level of the nasopharynx. Further description of tumor above. 2.  Submucosal low-density thickening and mucosal hyperenhancement in the pharynx. Although these are findings seen with prior radiotherapy, there is progression from 11/2017 and there may be superimposed pharyngitis. Electronically Signed   By:  Monte Fantasia M.D.   On: 03/24/2018 21:27   Ct Angio Chest Pe W And/or Wo Contrast  Result Date: 03/24/2018 CLINICAL DATA:  Shortness of breath. History of head and neck carcinoma EXAM: CT ANGIOGRAPHY CHEST WITH CONTRAST TECHNIQUE: Multidetector CT imaging of the chest was performed using the standard protocol during bolus administration of intravenous contrast. Multiplanar CT image reconstructions and MIPs were obtained to evaluate the vascular anatomy. CONTRAST:  146mL ISOVUE-370 IOPAMIDOL (ISOVUE-370) INJECTION 76% COMPARISON:  Chest CT December 13, 2016; chest radiograph March 24, 2018 FINDINGS: Cardiovascular: There is no demonstrable pulmonary embolus. There is no thoracic aortic aneurysm or dissection. Visualized great vessels appear normal. The right innominate and left common carotid arteries arise as a common trunk, an anatomic variant. There is no pericardial effusion or pericardial thickening. The main pulmonary outflow tract measures 3.2 cm in diameter, a finding concerning for a degree of underlying pulmonary arterial hypertension. Port-A-Cath tip is in the superior vena cava. Mediastinum/Nodes: There is medial left supraclavicular adenopathy measuring 3.3 x 2.7 cm. There is soft tissue mass throughout the mid to lower neck region surrounding the trachea. No appreciable normal thyroid tissue evident. No adenopathy is noted elsewhere in the thoracic region. There is soft tissue thickening throughout the mid esophagus with areas of fluid within the esophagus. Lungs/Pleura: There is a tracheostomy present with the tip in the upper thoracic trachea. Trachea and major bronchi appear intact. No pneumothorax. There is scarring in the lung apices. There is bibasilar  lung atelectatic change. There is no edema or consolidation. No pleural effusion or pleural thickening. No parenchymal lung nodular lesions are evident. Upper Abdomen: Visualized upper abdominal structures appear unremarkable. Musculoskeletal: There are no blastic or lytic bone lesions. Review of the MIP images confirms the above findings. IMPRESSION: 1. No demonstrable pulmonary embolus. No thoracic aortic aneurysm or dissection. 2. Extensive tumor in the mid to lower neck. No normal thyroid seen. There is medial left supraclavicular adenopathy measuring 3.3 x 2.7 cm. No adenopathy elsewhere. 3.  Tracheostomy present.  No tracheal or bronchial lesion evident. 4. No lung edema or consolidation. No parenchymal lung mass. No pleural effusion. 5. Prominence of the main pulmonary outflow tract, a finding indicative of pulmonary arterial hypertension. 6. Esophageal wall thickening involving mid esophagus and fluid in mid esophagus. Electronically Signed   By: Lowella Grip III M.D.   On: 03/24/2018 21:00   Dg Chest Portable 1 View  Result Date: 03/24/2018 CLINICAL DATA:  Ongoing shortness of breath for 2 days, fever, tracheostomy, diminished lung sounds, hypoxemia EXAM: PORTABLE CHEST 1 VIEW COMPARISON:  Portable exam 1555 hours compared to 01/02/2016 FINDINGS: Tracheostomy tube present with tip angled towards the RIGHT lateral wall. RIGHT jugular Port-A-Cath with tip projecting over cavoatrial junction. Normal heart size, mediastinal contours, and pulmonary vascularity. Lungs clear. No pleural effusion or pneumothorax. IMPRESSION: Tip of tracheostomy tube is slightly deviated, projecting towards the RIGHT lateral tracheal wall. Otherwise negative exam. Electronically Signed   By: Lavonia Dana M.D.   On: 03/24/2018 16:22    Procedures Procedures (including critical care time)  Medications Ordered in ED Medications  LORazepam (ATIVAN) injection 1 mg (1 mg Intravenous Given 03/24/18 1820)  sodium chloride  0.9 % injection (has no administration in time range)  sodium chloride 0.9 % injection (has no administration in time range)  iopamidol (ISOVUE-370) 76 % injection (has no administration in time range)  piperacillin-tazobactam (ZOSYN) IVPB 3.375 g (has no administration in time range)  vancomycin (VANCOCIN) IVPB  1000 mg/200 mL premix (has no administration in time range)  dexamethasone (DECADRON) injection 10 mg (10 mg Intravenous Given 03/24/18 1711)  iopamidol (ISOVUE-370) 76 % injection 100 mL (100 mLs Intravenous Contrast Given 03/24/18 1901)  propofol (DIPRIVAN) 1000 MG/100ML infusion (0 mcg/kg/min  68 kg Intravenous Stopped 03/24/18 2152)  propofol (DIPRIVAN) 10 mg/mL bolus/IV push 40 mg (40 mg Intravenous Given 03/24/18 2016)  rocuronium (ZEMURON) injection 60 mg (60 mg Intravenous Given 03/24/18 2027)  propofol (DIPRIVAN) 10 mg/mL bolus/IV push 40 mg (40 mg Intravenous Given 03/24/18 2027)     Initial Impression / Assessment and Plan / ED Course  I have reviewed the triage vital signs and the nursing notes.  Pertinent labs & imaging results that were available during my care of the patient were reviewed by me and considered in my medical decision making (see chart for details).   Tracheostomy tube changed by myself.  This was replaced with same size tube.  Inspection of his previous trach shows no mucous plugging or narrowing of the lumen.  This did not improve his symptoms.  He was given Ativan.  He was given humidified air.  He was hypoxemic and requires at least 8 L of blow-by to maintain 92-93%.  Work of breathing worsened.  At this point to feel he needs CT imaging to rule out further impingement on his sub-tracheostomy airway by tumor mass and rule out for PE as he is hypoxemic, has active malignancy, and tachycardia.  I attempted to try IV sedation with Ativan and he was unable to lay flat to tolerate CT imaging.  Procedure: Tracheostomy change.  This was done with assist of  respiratory.  I was able to remove his original tracheostomy.  The site appears granulated but patent.  I was able to replace the number for, with a #6 cuffed tube.  This was done to facilitate ventilation for imaging.  Procedure sedation and ventilation: Patient placed on the ventilator.  Was sedated with bolus doses of propofol and propofol infusion.  Was paralyzed with rocuronium.  Was able to complete CT imaging.  He is weaned from the propofol.  Is now on CPAP.  He tolerated this well, without incident, or hypoxemia.   CT imaging shows complete replacement of his upper, supra tracheal airway and pharynx with tumor and lymphadenopathy bulk.  There are pooling secretions in the pharynx and?  Pharyngitis.  He has no PE or infiltrate.  His dyspnea improved mostly with replacement of the larger colostomy tube.  I discussed the case with Dr. Tamala Julian of internal medicine hospitalist service.  He will see the patient.  Planning admission.  I had multiple lengthy discussions with the patient's sister.  We will ask palliative care to assist.  CRITICAL CARE Performed by: Lolita Patella   Total critical care time: 65 minutes  Critical care time was exclusive of separately billable procedures and treating other patients.  Critical care was necessary to treat or prevent imminent or life-threatening deterioration.  Critical care was time spent personally by me on the following activities: development of treatment plan with patient and/or surrogate as well as nursing, discussions with consultants, evaluation of patient's response to treatment, examination of patient, obtaining history from patient or surrogate, ordering and performing treatments and interventions, ordering and review of laboratory studies, ordering and review of radiographic studies, pulse oximetry and re-evaluation of patient's condition.   Final Clinical Impressions(s) / ED Diagnoses   Final diagnoses:  Acute respiratory distress  ED Discharge Orders    None       Tanna Furry, MD 03/24/18 2218

## 2018-03-24 NOTE — Progress Notes (Signed)
MD changed trach- uneventful. PT states he is breathing better at this time. No respiratory distress noted at this time.

## 2018-03-24 NOTE — Progress Notes (Signed)
PT placed on 35% ATC (10 lpm)- current Sp02 98%, RR 20, HR 100, BS cl-diminished with whz in throat, BP 127/81. Suctioned PT times 2- (catheter passed fine) resulted in small, white, clear sputum (PT tolerated fairly well- signs of anxiety but has since relaxed and sleeping). PT has #4 Cuffless [place]. PT is seen for laryngectomy with ENT (ED MD aware). Changed PT trach tie and placed drain sponge. When asked PT states he is breathing better at this time.

## 2018-03-24 NOTE — Progress Notes (Signed)
A consult was received from an ED physician for vancomycin per pharmacy dosing.  The patient's profile has been reviewed for ht/wt/allergies/indication/available labs.   A one time order has been placed for Vancomycin 1gm iv x1.  Further antibiotics/pharmacy consults should be ordered by admitting physician if indicated.                       Thank you, Nani Skillern [place]ford 03/24/2018  9:58 PM

## 2018-03-24 NOTE — H&P (Signed)
History and Physical    Kyle Alexander ZSW:[id number] DOB: 22-Aug-1961 DOA: 03/24/2018  Referring MD/NP/PA: Dr. Tanna Furry PCP: Clent Demark, PA-C  Patient coming from: home via EMS  Chief Complaint: Shortness of Breath  I have personally briefly reviewed patient's old medical records in [place]   HPI: Kyle Alexander is a 57 y.o. 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 Alvy Bimler, hypothyroidism, s/p tracheostomy and PEG; who presented with complaints of progressively worsening shortness of breath over the last 1 week. At baseline the patient lives alone in a extended care suite and is able to complete all of his ADLs without assistance. Patient is currently being weaned off sedation he was placed on for a procedure and history is obtained from the patient's sister who is present at bedside.  She notes that over the last 1 week he has been complaining of progressively worsening shortness of breath with intermittent fevers at home and complaints of neck pain.   He had gone to see Dr. Alvy Bimler in the office 2 days ago, and was started on morphine tablets for pain along with infusion of methotrexate.  Over the last 1 month or longer patient has been unable to speak even with occluding Passy-Muir valve as he previously had been able to do in the past.  ED Course: Upon admission into the emergency department patient was noted to have a temperature of 100.2 F, pulse 94-115, respirations up to 33, blood pressures maintained, and O2 saturation maintained on 8 L nasal cannula oxygen.  Initial ABG revealed pH of 7.386, PCO2 50.3, PO2 158.  Labs revealed WBC 16.1 and  lactic acid within normal limits. Influenza screen was negative.  Chest x-ray showed no acute infiltrate and that the patient's tracheostomy tube was deviated toward the right.  He was empirically given antibiotics of vancomycin and Zosyn.   Patient's trach was changed from 4 to a size 6. He was given rocuronium and propofol before being placed on the ventilator to undergo CT angiogram of the chest.  It did not show any acute signs of pulmonary embolus, but showed significant tumor of the mid-lower neck.  They were currently in the process of weaning patient off of propofol and off of the vent.  [facility] M have been noticed by the emergency room physician.  TRH called to admit.   Review of Systems  Unable to perform ROS: Patient nonverbal  Constitutional: Positive for fever.  Respiratory: Positive for shortness of breath.   Musculoskeletal: Positive for neck pain.    Past Medical History:  Diagnosis Date  . Fatigue 03/28/2016  . GERD (gastroesophageal reflux disease)   . Heartburn symptom 10/19/2015  . Hypertension   . Insomnia 01/22/2016  . Nausea & vomiting 12/15/2015  . S/P radiation therapy 04/16/2015-05/18/2015   Laryngopharynx and bilateral neck / 50 Gy in 20 fractions to gross disease, 45 Gy in 20 fractions to high risk nodal echelons   . Seizures ([place])   . Shortness of breath dyspnea   . Syncopal episodes 04/25/2016  . Throat cancer ([place]) 03/16/2015   left cervical lymph node / throat (2016)    Past Surgical History:  Procedure Laterality Date  . DIRECT LARYNGOSCOPY WITH BOTOX INJECTION N/A 02/26/2016   Procedure: DIRECT LARYNGOSCOPY ESOPHAGOSCOPY DILATION  AND TRACH REPLACMENT;  Surgeon: [name], MD;  Location: [place];  Service: ENT;  Laterality: N/A;  . ESOPHAGOSCOPY WITH  DILITATION N/A 02/26/2016   Procedure: ESOPHAGOSCOPY WITH DILITATION, PHARNGEAL BIOPSY;  Surgeon: [name], MD;  Location: [place];  Service: ENT;  Laterality: N/A;  . GASTROSTOMY N/A 03/31/2015   Procedure: OPEN GASTROSTOMY TUBE PLACEMENT;  Surgeon: Rolm Bookbinder, MD;  Location: [place];  Service: General;  Laterality: N/A;  . HERNIA REPAIR     right groin  . IR GENERIC HISTORICAL  08/09/2016   IR  GASTRIC TUBE PERC CHG W/O IMG GUIDE 08/09/2016 Jacqulynn Cadet, MD [facility]-INTERV RAD  . IR GENERIC HISTORICAL  02/10/2017   IR [place] TUBE PERCUT W/FLUORO 02/10/2017 Sandi Mariscal, MD [facility]INTER[facility]  . IR PATIENT EVAL TECH 0-60 MINS  04/12/2017  . IR [place] TUBE PERCUT W/FLUORO  10/30/2017  . KNEE SURGERY  1998   Left  . LYMPH NODE BIOPSY Left 03/16/15   left cervical, consistent with squamous cell carcinoma  . MULTIPLE EXTRACTIONS WITH ALVEOLOPLASTY N/A 03/26/2015   Procedure: EXTRACTION OF TOOTH #'S 1,2,5,6,7,8,9,10,11,12,13,18,19,21,22,23,24,25,26,27,28,29  WITH AVELOPLASTY;  Surgeon: Lenn Cal, DDS;  Location: [place];  Service: Oral Surgery;  Laterality: N/A;  . TONSILLECTOMY    . TONSILLECTOMY AND ADENOIDECTOMY    . TOOTH EXTRACTION  03/26/2015   Procedure: [place];  Surgeon: [name], MD;  Location: [place];  Service: ENT;;  . TRACHEOSTOMY TUBE PLACEMENT N/A 03/26/2015   Procedure: TRACHEOSTOMY ;  Surgeon: [name], MD;  Location: [place];  Service: ENT;  Laterality: N/A;     reports that he has quit smoking. His smoking use included cigarettes. He smoked 0.00 packs per day for 35.00 years. He has quit using smokeless tobacco. His smokeless tobacco use included chew. He reports that he drinks about 18.0 oz of alcohol per week. He reports that he has current or past drug history. Drug: Marijuana.  Allergies  Allergen Reactions  . Aspirin     Makes me bleed, says this happened when he was a child    Family History  Problem Relation Age of Onset  . Heart disease Mother   . Diabetes Father   . Hypertension Father     Prior to Admission medications   Medication Sig Start Date End Date Taking? Authorizing Provider  dexamethasone (DECADRON) 4 MG tablet TAKE 1 TABLET BY MOUTH DAILY. 02/06/18   [name], MD  fluticasone (FLONASE) 50 MCG/ACT nasal spray Place 2 sprays into both nostrils daily. 07/12/17   [name], MD  folic acid (FOLVITE) 1 MG tablet Take 1  tablet (1 mg total) by mouth daily. 01/03/18   [name], MD  gabapentin (NEURONTIN) 300 MG capsule Place 1 capsule (300 mg total) 3 (three) times daily into feeding tube. 10/30/17   [name], MD  lactulose (CHRONULAC) 10 GM/15ML solution TAKE 15 MLS (10 G TOTAL) BY MOUTH 3 TIMES DAILY. 01/22/18   [name], MD  levothyroxine (SYNTHROID) 75 MCG tablet Take 1 tablet (75 mcg total) by mouth daily before breakfast. 03/08/18   [name], MD  morphine (MS CONTIN) 100 MG 12 hr tablet Take 1 tablet (100 mg total) by mouth every 8 (eight) hours. 01/18/18   [name], MD  morphine (MSIR) 30 MG tablet Take 3 tablets (90 mg total) by mouth every 4 (four) hours as needed for severe pain. 03/22/18   [name], MD  Nutritional Supplements (FEEDING SUPPLEMENT, JEVITY 1.5 CAL/FIBER,) LIQD Give 1 can Osmolite 1.2 + 1 can of Jevity 1.5 QID via PEG with 60 cc free water before and after bolus.  Flush with 240 cc free water BID between feedings. 03/21/16   [name], MD  omeprazole (PRILOSEC) 40 MG capsule Take 1 capsule (40 mg total) by mouth daily. 05/04/17   Nelida Meuse III, MD  ondansetron (ZOFRAN) 8 MG tablet TAKE 1 TABLET BY MOUTH EVERY 8 HOURS AS NEEDED FOR NAUSEA 08/24/17   Gorsuch, Ni, MD  sodium chloride irrigation 0.9 % irrigation USE TO CLEAN AROUND TRACH AND PERFORM TRACH CARE ONCE DAILY AND AS NEEDED 04/04/17   [name], MD  triamcinolone (NASACORT AQ) 55 MCG/ACT AERO nasal inhaler Place 2 sprays into the nose daily. 10/10/16   [name], MD    Physical Exam:  Constitutional: Older male who is chronically ill-appearing, and currently lethargic not readily able to follow commands. Vitals:   03/24/18 1928 03/24/18 1937 03/24/18 2010 03/24/18 2020  BP:  (!) 131/99  (!) 153/108  Pulse: (!) 109 (!) 115  (!) 110  Resp: (!) 25 (!) 33  15  Temp:  100.2 F (37.9 C)    TempSrc:  Rectal    SpO2:  100%  99%  Height:   5\' 7"  (1.702 m)    Eyes: PERRL, lids and conjunctivae normal ENMT: Mucous  membranes are moist. Posterior pharynx clear of any exudate or lesions.  Increased nasal secretions noted.  Neck: normal, supple lymphadenopathy present Respiratory: Tracheostomy site appears clean.  Tachypneic currently on ventilator. Cardiovascular: Tachycardic, no murmurs / rubs / gallops. No extremity edema. 2+ pedal pulses. No carotid bruits.  Abdomen: no tenderness, no masses palpated. No hepatosplenomegaly. Bowel sounds positive.  Musculoskeletal: no clubbing / cyanosis. No joint deformity upper and lower extremities. Good ROM, no contractures. Normal muscle tone.  Skin: no rashes, lesions, ulcers. No induration Neurologic: CN 2-12 grossly intact. Sensation intact, DTR normal. Strength 5/5 in all 4.  Psychiatric: Unable to obtain at this time.    Labs on Admission: I have personally reviewed following labs and imaging studies  CBC: Recent Labs  Lab 03/22/18 0744 03/24/18 1604  WBC 20.1* 16.6*  NEUTROABS 18.8* 15.3*  HGB  --  13.1  HCT 36.8* 38.5*  MCV 94.4 93.9  PLT 231 979   Basic Metabolic Panel: Recent Labs  Lab 03/22/18 0744 03/24/18 1604  NA 139 137  K 3.7 3.9  CL 100 98*  CO2 29 27  GLUCOSE 171* 89  BUN 20 18  CREATININE 0.67* 0.60*  CALCIUM 9.2 8.9   GFR: Estimated Creatinine Clearance: 98.7 mL/min (A) (by C-G formula based on SCr of 0.6 mg/dL (L)). Liver Function Tests: Recent Labs  Lab 03/22/18 0744 03/24/18 1604  AST 16 19  ALT 49 41  ALKPHOS 69 62  BILITOT 0.2 0.9  PROT 6.1* 6.7  ALBUMIN 2.7* 3.0*   No results for input(s): LIPASE, AMYLASE in the last 168 hours. No results for input(s): AMMONIA in the last 168 hours. Coagulation Profile: No results for input(s): INR, PROTIME in the last 168 hours. Cardiac Enzymes: No results for input(s): CKTOTAL, CKMB, CKMBINDEX, TROPONINI in the last 168 hours. BNP (last 3 results) No results for input(s): PROBNP in the last 8760 hours. HbA1C: No results for input(s): HGBA1C in the last 72  hours. CBG: No results for input(s): GLUCAP in the last 168 hours. Lipid Profile: No results for input(s): CHOL, HDL, LDLCALC, TRIG, CHOLHDL, LDLDIRECT in the last 72 hours. Thyroid Function Tests: Recent Labs    03/22/18 0744  TSH 1.104   Anemia Panel: No results for input(s): VITAMINB12, FOLATE, FERRITIN,  TIBC, IRON, RETICCTPCT in the last 72 hours. Urine analysis:    Component Value Date/Time   COLORURINE AMBER (A) 03/15/2015 1533   APPEARANCEUR CLEAR 03/15/2015 1533   LABSPEC >1.046 (H) 03/15/2015 1533   PHURINE 5.5 03/15/2015 1533   GLUCOSEU NEGATIVE 03/15/2015 1533   HGBUR NEGATIVE 03/15/2015 1533   BILIRUBINUR SMALL (A) 03/15/2015 1533   KETONESUR 40 (A) 03/15/2015 1533   PROTEINUR 30 (A) 03/15/2015 1533   UROBILINOGEN 2.0 (H) 03/15/2015 1533   NITRITE NEGATIVE 03/15/2015 1533   LEUKOCYTESUR NEGATIVE 03/15/2015 1533   Sepsis Labs: No results found for this or any previous visit (from the past 240 hour(s)).   Radiological Exams on Admission: Ct Soft Tissue Neck W Contrast  Result Date: 03/24/2018 CLINICAL DATA:  Throat pain EXAM: CT NECK WITH CONTRAST TECHNIQUE: Multidetector CT imaging of the neck was performed using the standard protocol following the bolus administration of intravenous contrast. CONTRAST:  164mL ISOVUE-370 IOPAMIDOL (ISOVUE-370) INJECTION 76% COMPARISON:  12/04/2017 FINDINGS: Pharynx and larynx: Low-density mass centered at the larynx which measures up to 4.9 cm AP dimension. Mass grows upward into the glottis and anteriorly displaces the central compartment. Mass is indistinguishable from the esophageal verge and there is obstruction with secretions retained in the oropharynx and nasopharynx. Submucosal low-density thickening the level of the pharynx which may be related to inflammation or prior radiotherapy. Diffuse mucosal hyperenhancement. Salivary glands: Post treatment atrophy. No primary disease is seen. Adenopathy invades the right parotid tail.  Thyroid: Atrophic with malignant invasion on the left. Lymph nodes: Previously identified necrotic adenopathy has progressed. Right upper jugular node has enlarged to 4.1 cm and invades the parotid tail. Low left jugular adenopathy has enlarged to 3.8 cm and invades the scalene musculature, in close proximity to the brachial plexus. Probable right lateral retropharyngeal adenopathy with ill-defined asymmetric masslike density. Vascular: No visible pseudoaneurysm. The lower left IJ is effaced by the adenopathy. The upper right IJ is occluded. Limited intracranial: Negative Visualized orbits: Negative Mastoids and visualized paranasal sinuses: Mild right mastoid opacification. Skeleton: No visible metastatic deposit. Upper chest: Centrilobular emphysema. IMPRESSION: 1. History of head and neck cancer with progression of residual disease in the central compartment and bilateral nodal neck. Central compartment tumor obstructs the larynx/pharynx and upper esophagus and there is retention of secretions to the level of the nasopharynx. Further description of tumor above. 2. Submucosal low-density thickening and mucosal hyperenhancement in the pharynx. Although these are findings seen with prior radiotherapy, there is progression from 11/2017 and there may be superimposed pharyngitis. Electronically Signed   By: Monte Fantasia M.D.   On: 03/24/2018 21:27   Ct Angio Chest Pe W And/or Wo Contrast  Result Date: 03/24/2018 CLINICAL DATA:  Shortness of breath. History of head and neck carcinoma EXAM: CT ANGIOGRAPHY CHEST WITH CONTRAST TECHNIQUE: Multidetector CT imaging of the chest was performed using the standard protocol during bolus administration of intravenous contrast. Multiplanar CT image reconstructions and MIPs were obtained to evaluate the vascular anatomy. CONTRAST:  177mL ISOVUE-370 IOPAMIDOL (ISOVUE-370) INJECTION 76% COMPARISON:  Chest CT December 13, 2016; chest radiograph March 24, 2018 FINDINGS:  Cardiovascular: There is no demonstrable pulmonary embolus. There is no thoracic aortic aneurysm or dissection. Visualized great vessels appear normal. The right innominate and left common carotid arteries arise as a common trunk, an anatomic variant. There is no pericardial effusion or pericardial thickening. The main pulmonary outflow tract measures 3.2 cm in diameter, a finding concerning for a degree of underlying pulmonary arterial hypertension.  Port-A-Cath tip is in the superior vena cava. Mediastinum/Nodes: There is medial left supraclavicular adenopathy measuring 3.3 x 2.7 cm. There is soft tissue mass throughout the mid to lower neck region surrounding the trachea. No appreciable normal thyroid tissue evident. No adenopathy is noted elsewhere in the thoracic region. There is soft tissue thickening throughout the mid esophagus with areas of fluid within the esophagus. Lungs/Pleura: There is a tracheostomy present with the tip in the upper thoracic trachea. Trachea and major bronchi appear intact. No pneumothorax. There is scarring in the lung apices. There is bibasilar lung atelectatic change. There is no edema or consolidation. No pleural effusion or pleural thickening. No parenchymal lung nodular lesions are evident. Upper Abdomen: Visualized upper abdominal structures appear unremarkable. Musculoskeletal: There are no blastic or lytic bone lesions. Review of the MIP images confirms the above findings. IMPRESSION: 1. No demonstrable pulmonary embolus. No thoracic aortic aneurysm or dissection. 2. Extensive tumor in the mid to lower neck. No normal thyroid seen. There is medial left supraclavicular adenopathy measuring 3.3 x 2.7 cm. No adenopathy elsewhere. 3.  Tracheostomy present.  No tracheal or bronchial lesion evident. 4. No lung edema or consolidation. No parenchymal lung mass. No pleural effusion. 5. Prominence of the main pulmonary outflow tract, a finding indicative of pulmonary arterial  hypertension. 6. Esophageal wall thickening involving mid esophagus and fluid in mid esophagus. Electronically Signed   By: Lowella Grip III M.D.   On: 03/24/2018 21:00   Dg Chest Portable 1 View  Result Date: 03/24/2018 CLINICAL DATA:  Ongoing shortness of breath for 2 days, fever, tracheostomy, diminished lung sounds, hypoxemia EXAM: PORTABLE CHEST 1 VIEW COMPARISON:  Portable exam 1555 hours compared to 01/02/2016 FINDINGS: Tracheostomy tube present with tip angled towards the RIGHT lateral wall. RIGHT jugular Port-A-Cath with tip projecting over cavoatrial junction. Normal heart size, mediastinal contours, and pulmonary vascularity. Lungs clear. No pleural effusion or pneumothorax. IMPRESSION: Tip of tracheostomy tube is slightly deviated, projecting towards the RIGHT lateral tracheal wall. Otherwise negative exam. Electronically Signed   By: Lavonia Dana M.D.   On: 03/24/2018 16:22    EKG: Independently reviewed.  Sinus tachycardia at 104 bpm.  Assessment/Plan SIRS: acute.  Patient presents with low-grade fever, tachycardia, and tachypnea.  WBC noted to be 16.1, but appears to be more chronic in nature and lactic acid is reassuring.  No clear evidence of of infection.    Patient was empirically given antibiotics of vancomycin and Zosyn in case of underlying infection as he is immunocompromised.  - Admit to stepdown - Follow-up blood cultures - Continue vancomycin and Zosyn, de-escalate when medically appropriate  Acute respiratory failure with hypoxia and hypercapnia: Patient was noted to be in significant respiratory distress on arrival with ABG showing normal PCO2 elevated at 50.3. Patient's tracheostomy tube was deviated to the right as possible cause of symptoms. He was placed on to undergo CT angiogram, and currently in the process of weaning him off.   - Respiratory therapy consult - DuoNeb's as needed  Squamous cell carcinoma of the supraglottis: Poor overall prognosis with  progressively worsening tumor burden patient unable to talk. - Dr. Alvy Bimler added to patient team continue - Continue dexamethasone and pain medication - Palliative care consult likely needed during this hospitalization   S/p PEG tube - Continue tube feedings  S/p Tracheostomy: Tracheostomy changed out in the emergency department to a larger size cuff.    DVT prophylaxis: Lovenox Code Status: DNR Family Communication: Discussed plan of care  with patient and family present at bedside Disposition Plan: To be determined Consults called: none Admission status: Observation  Norval Morton MD [facility] Pager [phone]   If 7PM-7AM, please contact night-coverage [email or web address] Password [facility]  03/24/2018, 10:07 PM

## 2018-03-25 ENCOUNTER — Other Ambulatory Visit: Payer: Self-pay

## 2018-03-25 DIAGNOSIS — J9622 Acute and chronic respiratory failure with hypercapnia: Secondary | ICD-10-CM | POA: Diagnosis present

## 2018-03-25 DIAGNOSIS — Z923 Personal history of irradiation: Secondary | ICD-10-CM | POA: Diagnosis not present

## 2018-03-25 DIAGNOSIS — Z8249 Family history of ischemic heart disease and other diseases of the circulatory system: Secondary | ICD-10-CM | POA: Diagnosis not present

## 2018-03-25 DIAGNOSIS — J9690 Respiratory failure, unspecified, unspecified whether with hypoxia or hypercapnia: Secondary | ICD-10-CM | POA: Diagnosis present

## 2018-03-25 DIAGNOSIS — Z515 Encounter for palliative care: Secondary | ICD-10-CM

## 2018-03-25 DIAGNOSIS — Z930 Tracheostomy status: Secondary | ICD-10-CM

## 2018-03-25 DIAGNOSIS — Z931 Gastrostomy status: Secondary | ICD-10-CM

## 2018-03-25 DIAGNOSIS — K219 Gastro-esophageal reflux disease without esophagitis: Secondary | ICD-10-CM | POA: Diagnosis present

## 2018-03-25 DIAGNOSIS — Z9221 Personal history of antineoplastic chemotherapy: Secondary | ICD-10-CM | POA: Diagnosis not present

## 2018-03-25 DIAGNOSIS — J9621 Acute and chronic respiratory failure with hypoxia: Principal | ICD-10-CM

## 2018-03-25 DIAGNOSIS — Z7189 Other specified counseling: Secondary | ICD-10-CM

## 2018-03-25 DIAGNOSIS — Z833 Family history of diabetes mellitus: Secondary | ICD-10-CM | POA: Diagnosis not present

## 2018-03-25 DIAGNOSIS — R0603 Acute respiratory distress: Secondary | ICD-10-CM | POA: Diagnosis present

## 2018-03-25 DIAGNOSIS — Z8521 Personal history of malignant neoplasm of larynx: Secondary | ICD-10-CM | POA: Diagnosis not present

## 2018-03-25 DIAGNOSIS — Z886 Allergy status to analgesic agent status: Secondary | ICD-10-CM | POA: Diagnosis not present

## 2018-03-25 DIAGNOSIS — Z85819 Personal history of malignant neoplasm of unspecified site of lip, oral cavity, and pharynx: Secondary | ICD-10-CM | POA: Diagnosis not present

## 2018-03-25 DIAGNOSIS — R0602 Shortness of breath: Secondary | ICD-10-CM | POA: Diagnosis not present

## 2018-03-25 DIAGNOSIS — C321 Malignant neoplasm of supraglottis: Secondary | ICD-10-CM | POA: Diagnosis present

## 2018-03-25 DIAGNOSIS — Z66 Do not resuscitate: Secondary | ICD-10-CM | POA: Diagnosis present

## 2018-03-25 DIAGNOSIS — I10 Essential (primary) hypertension: Secondary | ICD-10-CM | POA: Diagnosis present

## 2018-03-25 DIAGNOSIS — R6510 Systemic inflammatory response syndrome (SIRS) of non-infectious origin without acute organ dysfunction: Secondary | ICD-10-CM

## 2018-03-25 DIAGNOSIS — Z87891 Personal history of nicotine dependence: Secondary | ICD-10-CM | POA: Diagnosis not present

## 2018-03-25 DIAGNOSIS — Z9089 Acquired absence of other organs: Secondary | ICD-10-CM | POA: Diagnosis not present

## 2018-03-25 DIAGNOSIS — E038 Other specified hypothyroidism: Secondary | ICD-10-CM | POA: Diagnosis present

## 2018-03-25 LAB — BASIC METABOLIC PANEL
ANION GAP: 13 (ref 5–15)
BUN: 20 mg/dL (ref 6–20)
CO2: 23 mmol/L (ref 22–32)
Calcium: 8.7 mg/dL — ABNORMAL LOW (ref 8.9–10.3)
Chloride: 100 mmol/L — ABNORMAL LOW (ref 101–111)
Creatinine, Ser: 0.67 mg/dL (ref 0.61–1.24)
Glucose, Bld: 116 mg/dL — ABNORMAL HIGH (ref 65–99)
POTASSIUM: 4 mmol/L (ref 3.5–5.1)
SODIUM: 136 mmol/L (ref 135–145)

## 2018-03-25 LAB — CBC
HCT: 35.9 % — ABNORMAL LOW (ref 39.0–52.0)
HEMOGLOBIN: 11.7 g/dL — AB (ref 13.0–17.0)
MCH: 30.3 pg (ref 26.0–34.0)
MCHC: 32.6 g/dL (ref 30.0–36.0)
MCV: 93 fL (ref 78.0–100.0)
PLATELETS: 250 10*3/uL (ref 150–400)
RBC: 3.86 MIL/uL — AB (ref 4.22–5.81)
RDW: 17.5 % — ABNORMAL HIGH (ref 11.5–15.5)
WBC: 15.3 10*3/uL — AB (ref 4.0–10.5)

## 2018-03-25 LAB — BLOOD GAS, ARTERIAL
ACID-BASE EXCESS: 3.2 mmol/L — AB (ref 0.0–2.0)
BICARBONATE: 24.8 mmol/L (ref 20.0–28.0)
Drawn by: 225631
FIO2: 28
LHR: 15 {breaths}/min
MECHVT: 0.53 mL
Mechanical Rate: 15
O2 Saturation: 98.3 %
PEEP: 5 cmH2O
Patient temperature: 98.6
pCO2 arterial: 28.8 mmHg — ABNORMAL LOW (ref 32.0–48.0)
pH, Arterial: 7.544 — ABNORMAL HIGH (ref 7.350–7.450)
pO2, Arterial: 110 mmHg — ABNORMAL HIGH (ref 83.0–108.0)

## 2018-03-25 LAB — MRSA PCR SCREENING: MRSA by PCR: NEGATIVE

## 2018-03-25 MED ORDER — VANCOMYCIN HCL IN DEXTROSE 1-5 GM/200ML-% IV SOLN: 1000.0000 mg | Freq: Two times a day (BID) | INTRAVENOUS | Status: DC

## 2018-03-25 MED ORDER — OSMOLITE 1.2 CAL PO LIQD
237.0000 mL | Freq: Four times a day (QID) | ORAL | Status: DC
  Administered 2018-03-25 – 2018-03-26 (×4): 237 mL
  Filled 2018-03-25: qty 237

## 2018-03-25 MED ORDER — NICOTINE 14 MG/24HR TD PT24
14.0000 mg | MEDICATED_PATCH | Freq: Every day | TRANSDERMAL | Status: DC
  Administered 2018-03-25 – 2018-03-26 (×2): 14 mg via TRANSDERMAL
  Filled 2018-03-25 (×2): qty 1

## 2018-03-25 MED ORDER — JEVITY 1.5 CAL/FIBER PO LIQD
240.0000 mL | Freq: Four times a day (QID) | ORAL | Status: DC
  Administered 2018-03-25: 240 mL
  Administered 2018-03-25: 237 mL
  Administered 2018-03-25 – 2018-03-26 (×3): 240 mL
  Filled 2018-03-25 (×7): qty 1000

## 2018-03-25 MED ORDER — FAMOTIDINE IN NACL 20-0.9 MG/50ML-% IV SOLN
20.0000 mg | Freq: Two times a day (BID) | INTRAVENOUS | Status: DC
  Administered 2018-03-25 – 2018-03-26 (×4): 20 mg via INTRAVENOUS
  Filled 2018-03-25 (×4): qty 50

## 2018-03-25 MED ORDER — OSMOLITE 1.5 CAL PO LIQD
240.0000 mL | Freq: Four times a day (QID) | ORAL | Status: DC
  Administered 2018-03-25: 240 mL
  Filled 2018-03-25: qty 474

## 2018-03-25 MED ORDER — PIPERACILLIN-TAZOBACTAM 3.375 G IVPB
3.3750 g | Freq: Three times a day (TID) | INTRAVENOUS | Status: DC
  Administered 2018-03-25: 3.375 g via INTRAVENOUS
  Filled 2018-03-25: qty 50

## 2018-03-25 NOTE — Consult Note (Signed)
Consultation Note Date: 03/25/2018   Patient Name: Kyle Alexander  DOB: 1961/09/22  MRN: [id number]  Age / Sex: 57 y.o., male  PCP: Clent Demark, PA-C Referring Physician: Charlynne Cousins, MD  Reason for Consultation: Establishing goals of care  HPI/Patient Profile: 57 y.o. 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  Palliative consulted for goals of care.   Clinical Assessment and Goals of Care: I met today with [name] and his sister. We discussed clinical course as well as wishes moving forward in regard to advanced directives.  Concepts specific to code status and rehospitalization discussed.  We discussed difference between a aggressive medical intervention path and a palliative, comfort focused care path.  Values and goals of care important to patient and family were attempted to be elicited.  Concept of Hospice and Palliative Care were discussed  Questions and concerns addressed.   PMT will continue to support holistically.  SUMMARY OF RECOMMENDATIONS   - DNR/DNI - Goal is home with hospice support.  Consult placed to CM. - He would like to find better long term housing.  He currently lives in hotel where he pays by the week.  SW consult to assist as we are able while he is inpatient. - Pain: Continue MSIR as needed.  It is unclear how he is taking his MS Contin.  He reports not taking it, then reports that he will sometimes crush and take it.  Will plan to continue with home short acting medication for now.  Code Status/Advance Care Planning: DNR Psycho-social/Spiritual:   Desire for further Chaplaincy support:no  Additional Recommendations: Education on Hospice  Prognosis:   < 6 months  Discharge Planning: Home with  Hospice      Primary Diagnoses: Present on Admission: . Acute on chronic respiratory failure with hypoxia ([place]) . 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 Respiratory failure ([place])   I have reviewed the medical record, interviewed the patient and family, and examined the patient. The following aspects are pertinent.  Past Medical History:  Diagnosis Date  . Fatigue 03/28/2016  . GERD (gastroesophageal reflux disease)   . Heartburn symptom 10/19/2015  . Hypertension   . Insomnia 01/22/2016  . Nausea & vomiting 12/15/2015  . S/P radiation therapy 04/16/2015-05/18/2015   Laryngopharynx and bilateral neck / 50 Gy in 20 fractions to gross disease, 45 Gy in 20 fractions to high risk nodal echelons   . Seizures ([place])   . Shortness of breath dyspnea   . Syncopal episodes 04/25/2016  . Throat cancer ([place]) 03/16/2015   left cervical lymph node / throat (2016)   Social History   Socioeconomic History  . Marital status: Single    Spouse name: Not on file  . Number of children: 0  . Years of education: Not on file  . Highest education level: Not on file  Occupational History  . Not on  file  Social Needs  . Financial resource strain: Not on file  . Food insecurity:    Worry: Not on file    Inability: Not on file  . Transportation needs:    Medical: Not on file    Non-medical: Not on file  Tobacco Use  . Smoking status: Former Smoker    Packs/day: 0.00    Years: 35.00    Pack years: 0.00    Types: Cigarettes  . Smokeless tobacco: Former Systems developer    Types: Chew  . Tobacco comment: 2-4 cig per day  Substance and Sexual Activity  . Alcohol use: Yes    Alcohol/week: 18.0 oz    Types: 30 Standard drinks or equivalent per week    Comment: occasional  . Drug use: Yes    Types: Marijuana    Comment: occasional   . Sexual activity: Not Currently    Comment: significant other Kyle Alexander  Lifestyle  . Physical activity:    Days per week: Not on file    Minutes per session:  Not on file  . Stress: Not on file  Relationships  . Social connections:    Talks on phone: Not on file    Gets together: Not on file    Attends religious service: Not on file    Active member of club or organization: Not on file    Attends meetings of clubs or organizations: Not on file    Relationship status: Not on file  Other Topics Concern  . Not on file  Social History Narrative   Lives alone, unemployed. Previously worked in Architect.   Family History  Problem Relation Age of Onset  . Heart disease Mother   . Diabetes Father   . Hypertension Father    Scheduled Meds: . dexamethasone  4 mg Per Tube Daily  . enoxaparin (LOVENOX) injection  40 mg Subcutaneous QHS  . feeding supplement (JEVITY 1.5 CAL/FIBER)  240 mL Per Tube QID  . feeding supplement (OSMOLITE 1.2 CAL)  237 mL Per Tube QID  . fluticasone  2 spray Each Nare Daily  . gabapentin  300 mg Per Tube TID  . nicotine  14 mg Transdermal Daily  . pantoprazole  40 mg Oral Daily   Continuous Infusions: . famotidine (PEPCID) IV Stopped (03/25/18 1001)   PRN Meds:.acetaminophen **OR** acetaminophen, ipratropium-albuterol, LORazepam, morphine, ondansetron **OR** ondansetron (ZOFRAN) IV Medications Prior to Admission:  Prior to Admission medications   Medication Sig Start Date End Date Taking? Authorizing Provider  dexamethasone (DECADRON) 4 MG tablet TAKE 1 TABLET BY MOUTH DAILY. 02/06/18  Yes Gorsuch, Ernst Spell, MD  folic acid (FOLVITE) 1 MG tablet Take 1 tablet (1 mg total) by mouth daily. 01/03/18  Yes Gorsuch, Ni, MD  lactulose (CHRONULAC) 10 GM/15ML solution TAKE 15 MLS (10 G TOTAL) BY MOUTH 3 TIMES DAILY. 01/22/18  Yes [name], MD  levothyroxine (SYNTHROID) 75 MCG tablet Take 1 tablet (75 mcg total) by mouth daily before breakfast. 03/08/18  Yes Gorsuch, Ni, MD  morphine (MSIR) 30 MG tablet Take 3 tablets (90 mg total) by mouth every 4 (four) hours as needed for severe pain. 03/22/18  Yes [name], MD  Nutritional  Supplements (FEEDING SUPPLEMENT, JEVITY 1.5 CAL/FIBER,) LIQD Give 1 can Osmolite 1.2 + 1 can of Jevity 1.5 QID via PEG with 60 cc free water before and after bolus. Flush with 240 cc free water BID between feedings. 03/21/16  Yes [name], MD  omeprazole (PRILOSEC) 40 MG capsule Take 1  capsule (40 mg total) by mouth daily. 05/04/17  Yes Danis, Estill Cotta III, MD  ondansetron (ZOFRAN) 8 MG tablet TAKE 1 TABLET BY MOUTH EVERY 8 HOURS AS NEEDED FOR NAUSEA 08/24/17  Yes Gorsuch, Ni, MD  gabapentin (NEURONTIN) 300 MG capsule Place 1 capsule (300 mg total) 3 (three) times daily into feeding tube. Patient not taking: Reported on 03/25/2018 10/30/17   [name], MD  morphine (MS CONTIN) 100 MG 12 hr tablet Take 1 tablet (100 mg total) by mouth every 8 (eight) hours. Patient not taking: Reported on 03/25/2018 01/18/18   [name], MD  sodium chloride irrigation 0.9 % irrigation USE TO CLEAN AROUND TRACH AND PERFORM [facility] CARE ONCE DAILY AND AS NEEDED 04/04/17   [name], MD   Allergies  Allergen Reactions  . Aspirin     Makes me bleed, says this happened when he was a child   Review of Systems  Constitutional: Positive for activity change and fatigue.  Respiratory: Positive for cough, choking and shortness of breath.   Neurological: Positive for weakness.   Physical Exam  General: Alert, awake, in no acute distress. Nonverbal. Trach in place. Writes to communicate. HEENT: No bruits, no goiter, no JVD Heart: Regular rate and rhythm. No murmur appreciated. Lungs: Good air movement, clear Abdomen: Soft, nontender, nondistended, positive bowel sounds.  Ext: No significant edema Skin: Warm and dry  Vital Signs: BP (!) 133/91   Pulse (!) 108   Temp 98.7 F (37.1 C) (Oral)   Resp 19   Ht _0  (1.702 m)   Wt 66.3 kg (146 lb 2.6 oz)   SpO2 99%   BMI 22.89 kg/m  Pain Scale: 0-10   Pain Score: 5    SpO2: SpO2: 99 % O2 Device:SpO2: 99 % O2 Flow Rate: .O2 Flow Rate (L/min): 5 L/min  IO:  Intake/output summary:   Intake/Output Summary (Last 24 hours) at 03/25/2018 1812 Last data filed at 03/25/2018 1800 Gross per 24 hour  Intake 3144.73 ml  Output 1940 ml  Net 1204.73 ml    LBM:   Baseline Weight: Weight: 66.3 kg (146 lb 2.6 oz) Most recent weight: Weight: 66.3 kg (146 lb 2.6 oz)     Palliative Assessment/Data:     Time In: 1200 Time Out: 1315 Time Total: 75 Greater than 50%  of this time was spent counseling and coordinating care related to the above assessment and plan.  Signed by: Micheline Rough, MD   Please contact Palliative Medicine Team phone at [phone] for questions and concerns.  For individual provider: See Shea Evans

## 2018-03-25 NOTE — ED Notes (Signed)
ED TO INPATIENT HANDOFF REPORT  Name/Age/Gender Kyle Alexander 57 y.o. male  Code Status    Code Status Orders  (From admission, onward)        Start     Ordered   03/24/18 2251  Do not attempt resuscitation (DNR)  Continuous    Question Answer Comment  In the event of cardiac or respiratory ARREST Do not call a "code blue"   In the event of cardiac or respiratory ARREST Do not perform Intubation, CPR, defibrillation or ACLS   In the event of cardiac or respiratory ARREST Use medication by any route, position, wound care, and other measures to relive pain and suffering. May use oxygen, suction and manual treatment of airway obstruction as needed for comfort.      03/24/18 2253    Code Status History    Date Active Date Inactive Code Status Order ID Comments User Context   03/26/2015 1630 04/07/2015 1949 Full Code [id number]  Lenn Cal, DDS Inpatient   03/26/2015 1307 03/26/2015 1630 Full Code [id number]  Juluis Mire, MD Inpatient   03/16/2015 2012 03/17/2015 2122 Full Code [id number]  Markus Daft, MD Inpatient   03/15/2015 0959 03/16/2015 2012 Full Code [id number]  Juluis Mire, MD ED      Home/SNF/Other Nursing Home  Chief Complaint Respitory distress   Level of Care/Admitting Diagnosis ED Disposition    ED Disposition Condition [place]: [facility] [[id number]]  Level of Care: Stepdown [14]  Admit to SDU based on following criteria: Respiratory Distress:  Frequent assessment and/or intervention to maintain adequate ventilation/respiration, pulmonary toilet, and respiratory treatment.  Diagnosis: Respiratory failure [facility]) [id number]  Admitting Physician: Norval Morton [[id number]]  Attending Physician: Norval Morton [[id number]]  PT Class (Do Not Modify): Observation [104]  PT Acc Code (Do Not Modify): Observation [10022]       Medical History Past Medical History:  Diagnosis Date  . Fatigue 03/28/2016  . GERD  (gastroesophageal reflux disease)   . Heartburn symptom 10/19/2015  . Hypertension   . Insomnia 01/22/2016  . Nausea & vomiting 12/15/2015  . S/P radiation therapy 04/16/2015-05/18/2015   Laryngopharynx and bilateral neck / 50 Gy in 20 fractions to gross disease, 45 Gy in 20 fractions to high risk nodal echelons   . Seizures ([place])   . Shortness of breath dyspnea   . Syncopal episodes 04/25/2016  . 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 Aspirin     Makes me bleed, says this happened when he was a child    IV Location/Drains/Wounds Patient Lines/Drains/Airways Status   Active Line/Drains/Airways    Name:   Placement date:   Placement time:   Site:   Days:   Implanted Port 11/30/15 Right Chest   11/30/15    1802    Chest   845   Peripheral IV 03/24/18 Right;Posterior Forearm   03/24/18    2000    Forearm   less than 1   Peripheral IV 03/24/18 Left Antecubital   03/24/18    2350    Antecubital   less than 1   Gastrostomy/Enterostomy Gastrostomy 20 Fr. LUQ   10/30/17    1505    LUQ   145   Incision (Closed) 03/26/15 Neck Other (Comment)   03/26/15    1143     1094   Incision (Closed) 03/31/15 Abdomen Other (  Comment)   03/31/15    0946     1089   Incision (Closed) 02/26/16 Throat   02/26/16    0930     757   Tracheostomy [place] 4 mm Uncuffed   04/01/15    0915    4 mm   1088   Tracheostomy Shiley 4 mm Uncuffed   -    -    4 mm      Tracheostomy [place] 6 mm Cuffed   03/24/18    2015    6 mm   less than 1   Pressure Ulcer 04/04/15 Stage II -  Partial thickness loss of dermis presenting as a shallow open ulcer with a red, pink wound bed without slough. under medical device   04/04/15    0833     1085          Labs/Imaging Results for orders placed or performed during the hospital encounter of 03/24/18 (from the past 48 hour(s))  CBC with Differential/Platelet     Status: Abnormal   Collection Time: 03/24/18   4:04 PM  Result Value Ref Range   WBC 16.6 (H) 4.0 - 10.5 K/uL   RBC 4.10 (L) 4.22 - 5.81 MIL/uL   Hemoglobin 13.1 13.0 - 17.0 g/dL   HCT 38.5 (L) 39.0 - 52.0 %   MCV 93.9 78.0 - 100.0 fL   MCH 32.0 26.0 - 34.0 pg   MCHC 34.0 30.0 - 36.0 g/dL   RDW 17.5 (H) 11.5 - 15.5 %   Platelets 254 150 - 400 K/uL   Neutrophils Relative % 92 %   Neutro Abs 15.3 (H) 1.7 - 7.7 K/uL   Lymphocytes Relative 3 %   Lymphs Abs 0.5 (L) 0.7 - 4.0 K/uL   Monocytes Relative 5 %   Monocytes Absolute 0.8 0.1 - 1.0 K/uL   Eosinophils Relative 0 %   Eosinophils Absolute 0.0 0.0 - 0.7 K/uL   Basophils Relative 0 %   Basophils Absolute 0.0 0.0 - 0.1 K/uL    Comment: Performed at [facility], [place] [address], [place], [place] [address]  Comprehensive metabolic panel     Status: Abnormal   Collection Time: 03/24/18  4:04 PM  Result Value Ref Range   Sodium 137 135 - 145 mmol/L   Potassium 3.9 3.5 - 5.1 mmol/L   Chloride 98 (L) 101 - 111 mmol/L   CO2 27 22 - 32 mmol/L   Glucose, Bld 89 65 - 99 mg/dL   BUN 18 6 - 20 mg/dL   Creatinine, Ser 0.60 (L) 0.61 - 1.24 mg/dL   Calcium 8.9 8.9 - 10.3 mg/dL   Total Protein 6.7 6.5 - 8.1 g/dL   Albumin 3.0 (L) 3.5 - 5.0 g/dL   AST 19 15 - 41 U/L   ALT 41 17 - 63 U/L   Alkaline Phosphatase 62 38 - 126 U/L   Total Bilirubin 0.9 0.3 - 1.2 mg/dL   GFR calc non Af Amer >60 >60 mL/min   GFR calc Af Amer >60 >60 mL/min    Comment: (NOTE) The eGFR has been calculated using the CKD EPI equation. This calculation has not been validated in all clinical situations. eGFR's persistently <60 mL/min signify possible Chronic Kidney Disease.    Anion gap 12 5 - 15    Comment: Performed at [facility], [place] [address] [place], [place] [address]  I-stat troponin, ED     Status: None   Collection Time:  03/24/18  4:08 PM  Result Value Ref Range   Troponin i, poc 0.00 0.00 - 0.08 ng/mL   Comment 3            Comment: Due to the release  kinetics of cTnI, a negative result within the first hours of the onset of symptoms does not rule out myocardial infarction with certainty. If myocardial infarction is still suspected, repeat the test at appropriate intervals.   I-Stat CG4 Lactic Acid, ED     Status: None   Collection Time: 03/24/18  4:10 PM  Result Value Ref Range   Lactic Acid, Venous 1.19 0.5 - 1.9 mmol/L  Influenza panel by PCR (type A & B)     Status: None   Collection Time: 03/24/18  6:13 PM  Result Value Ref Range   Influenza A By PCR NEGATIVE NEGATIVE   Influenza B By PCR NEGATIVE NEGATIVE    Comment: (NOTE) The Xpert Xpress Flu assay is intended as an aid in the diagnosis of  influenza and should not be used as a sole basis for treatment.  This  assay is FDA approved for nasopharyngeal swab specimens only. Nasal  washings and aspirates are unacceptable for Xpert Xpress Flu testing. Performed at [facility], [place] [address] [place], [place] [address]   Blood gas, arterial     Status: Abnormal   Collection Time: 03/24/18  7:10 PM  Result Value Ref Range   FIO2 35.00    Delivery systems AEROSOL TRACH COLLAR    pH, Arterial 7.386 7.350 - 7.450   pCO2 arterial 50.3 (H) 32.0 - 48.0 mmHg   pO2, Arterial 158 (H) 83.0 - 108.0 mmHg   Bicarbonate 29.2 (H) 20.0 - 28.0 mmol/L   Acid-Base Excess 4.0 (H) 0.0 - 2.0 mmol/L   O2 Saturation 98.5 %   Patient temperature 100.2    Collection site LEFT RADIAL    Drawn by [id number]    Sample type ARTERIAL DRAW    Allens test (pass/fail) PASS PASS    Comment: Performed at [facility], [place] [address], [place], [place] [address]  I-Stat CG4 Lactic Acid, ED     Status: None   Collection Time: 03/24/18  8:13 PM  Result Value Ref Range   Lactic Acid, Venous 0.92 0.5 - 1.9 mmol/L  Urinalysis, Routine w reflex microscopic     Status: Abnormal   Collection Time: 03/24/18  9:09 PM  Result Value Ref Range   Color, Urine YELLOW YELLOW    APPearance CLEAR CLEAR   Specific Gravity, Urine 1.013 1.005 - 1.030   pH 7.0 5.0 - 8.0   Glucose, UA NEGATIVE NEGATIVE mg/dL   Hgb urine dipstick SMALL (A) NEGATIVE   Bilirubin Urine NEGATIVE NEGATIVE   Ketones, ur 5 (A) NEGATIVE mg/dL   Protein, ur NEGATIVE NEGATIVE mg/dL   Nitrite NEGATIVE NEGATIVE   Leukocytes, UA NEGATIVE NEGATIVE   RBC / HPF 6-30 0 - 5 RBC/hpf   WBC, UA 0-5 0 - 5 WBC/hpf   Bacteria, UA NONE SEEN NONE SEEN   Squamous Epithelial / LPF NONE SEEN NONE SEEN    Comment: Performed at [facility], [place] [address], [place], [place] [address]   Ct Soft Tissue Neck W Contrast  Result Date: 03/24/2018 CLINICAL DATA:  Throat pain EXAM: CT NECK WITH CONTRAST TECHNIQUE: Multidetector CT imaging of the neck was performed using the standard protocol following the bolus administration of intravenous contrast. CONTRAST:  171m ISOVUE-370 IOPAMIDOL (  ISOVUE-370) INJECTION 76% COMPARISON:  12/04/2017 FINDINGS: Pharynx and larynx: Low-density mass centered at the larynx which measures up to 4.9 cm AP dimension. Mass grows upward into the glottis and anteriorly displaces the central compartment. Mass is indistinguishable from the esophageal verge and there is obstruction with secretions retained in the oropharynx and nasopharynx. Submucosal low-density thickening the level of the pharynx which may be related to inflammation or prior radiotherapy. Diffuse mucosal hyperenhancement. Salivary glands: Post treatment atrophy. No primary disease is seen. Adenopathy invades the right parotid tail. Thyroid: Atrophic with malignant invasion on the left. Lymph nodes: Previously identified necrotic adenopathy has progressed. Right upper jugular node has enlarged to 4.1 cm and invades the parotid tail. Low left jugular adenopathy has enlarged to 3.8 cm and invades the scalene musculature, in close proximity to the brachial plexus. Probable right lateral retropharyngeal adenopathy with  ill-defined asymmetric masslike density. Vascular: No visible pseudoaneurysm. The lower left IJ is effaced by the adenopathy. The upper right IJ is occluded. Limited intracranial: Negative Visualized orbits: Negative Mastoids and visualized paranasal sinuses: Mild right mastoid opacification. Skeleton: No visible metastatic deposit. Upper chest: Centrilobular emphysema. IMPRESSION: 1. History of head and neck cancer with progression of residual disease in the central compartment and bilateral nodal neck. Central compartment tumor obstructs the larynx/pharynx and upper esophagus and there is retention of secretions to the level of the nasopharynx. Further description of tumor above. 2. Submucosal low-density thickening and mucosal hyperenhancement in the pharynx. Although these are findings seen with prior radiotherapy, there is progression from 11/2017 and there may be superimposed pharyngitis. Electronically Signed   By: Monte Fantasia M.D.   On: 03/24/2018 21:27   Ct Angio Chest Pe W And/or Wo Contrast  Result Date: 03/24/2018 CLINICAL DATA:  Shortness of breath. History of head and neck carcinoma EXAM: CT ANGIOGRAPHY CHEST WITH CONTRAST TECHNIQUE: Multidetector CT imaging of the chest was performed using the standard protocol during bolus administration of intravenous contrast. Multiplanar CT image reconstructions and MIPs were obtained to evaluate the vascular anatomy. CONTRAST:  130m ISOVUE-370 IOPAMIDOL (ISOVUE-370) INJECTION 76% COMPARISON:  Chest CT December 13, 2016; chest radiograph March 24, 2018 FINDINGS: Cardiovascular: There is no demonstrable pulmonary embolus. There is no thoracic aortic aneurysm or dissection. Visualized great vessels appear normal. The right innominate and left common carotid arteries arise as a common trunk, an anatomic variant. There is no pericardial effusion or pericardial thickening. The main pulmonary outflow tract measures 3.2 cm in diameter, a finding concerning for a  degree of underlying pulmonary arterial hypertension. Port-A-Cath tip is in the superior vena cava. Mediastinum/Nodes: There is medial left supraclavicular adenopathy measuring 3.3 x 2.7 cm. There is soft tissue mass throughout the mid to lower neck region surrounding the trachea. No appreciable normal thyroid tissue evident. No adenopathy is noted elsewhere in the thoracic region. There is soft tissue thickening throughout the mid esophagus with areas of fluid within the esophagus. Lungs/Pleura: There is a tracheostomy present with the tip in the upper thoracic trachea. Trachea and major bronchi appear intact. No pneumothorax. There is scarring in the lung apices. There is bibasilar lung atelectatic change. There is no edema or consolidation. No pleural effusion or pleural thickening. No parenchymal lung nodular lesions are evident. Upper Abdomen: Visualized upper abdominal structures appear unremarkable. Musculoskeletal: There are no blastic or lytic bone lesions. Review of the MIP images confirms the above findings. IMPRESSION: 1. No demonstrable pulmonary embolus. No thoracic aortic aneurysm or dissection. 2. Extensive tumor in the  mid to lower neck. No normal thyroid seen. There is medial left supraclavicular adenopathy measuring 3.3 x 2.7 cm. No adenopathy elsewhere. 3.  Tracheostomy present.  No tracheal or bronchial lesion evident. 4. No lung edema or consolidation. No parenchymal lung mass. No pleural effusion. 5. Prominence of the main pulmonary outflow tract, a finding indicative of pulmonary arterial hypertension. 6. Esophageal wall thickening involving mid esophagus and fluid in mid esophagus. Electronically Signed   By: Lowella Grip III M.D.   On: 03/24/2018 21:00   Dg Chest Portable 1 View  Result Date: 03/24/2018 CLINICAL DATA:  Ongoing shortness of breath for 2 days, fever, tracheostomy, diminished lung sounds, hypoxemia EXAM: PORTABLE CHEST 1 VIEW COMPARISON:  Portable exam 1555 hours  compared to 01/02/2016 FINDINGS: Tracheostomy tube present with tip angled towards the RIGHT lateral wall. RIGHT jugular Port-A-Cath with tip projecting over cavoatrial junction. Normal heart size, mediastinal contours, and pulmonary vascularity. Lungs clear. No pleural effusion or pneumothorax. IMPRESSION: Tip of tracheostomy tube is slightly deviated, projecting towards the RIGHT lateral tracheal wall. Otherwise negative exam. Electronically Signed   By: Lavonia Dana M.D.   On: 03/24/2018 16:22    Pending Labs Unresulted Labs (From admission, onward)   Start     Ordered   03/25/18 0500  CBC  Tomorrow morning,   R     03/24/18 2253   03/25/18 0981  Basic metabolic panel  Tomorrow morning,   R     03/24/18 2253   03/24/18 1951  Culture, blood (Routine X 2) w Reflex to ID Panel  BLOOD CULTURE X 2,   STAT     03/24/18 1950   03/24/18 1951  Urine Culture  STAT,   STAT     03/24/18 1950      Vitals/Pain Today's Vitals   03/24/18 2300 03/24/18 2307 03/24/18 2315 03/24/18 2330  BP: 103/82 103/72  101/79  Pulse: 97 (!) 102 (!) 103 100  Resp: '15 15 15 15  '$ Temp:      TempSrc:      SpO2: 100% 100% 100% 100%  Height:      PainSc:        Isolation Precautions No active isolations  Medications Medications  LORazepam (ATIVAN) injection 1 mg (1 mg Intravenous Given 03/24/18 1820)  sodium chloride 0.9 % injection (has no administration in time range)  sodium chloride 0.9 % injection (has no administration in time range)  iopamidol (ISOVUE-370) 76 % injection (has no administration in time range)  vancomycin (VANCOCIN) IVPB 1000 mg/200 mL premix (has no administration in time range)  folic acid (FOLVITE) tablet 1 mg (has no administration in time range)  morphine (MSIR) tablet 90 mg (has no administration in time range)  morphine (MS CONTIN) 12 hr tablet 100 mg (has no administration in time range)  gabapentin (NEURONTIN) capsule 300 mg (has no administration in time range)  dexamethasone  (DECADRON) tablet 4 mg (has no administration in time range)  fluticasone (FLONASE) 50 MCG/ACT nasal spray 2 spray (has no administration in time range)  levothyroxine (SYNTHROID, LEVOTHROID) tablet 75 mcg (has no administration in time range)  pantoprazole (PROTONIX) EC tablet 40 mg (has no administration in time range)  feeding supplement (JEVITY 1.5 CAL/FIBER) liquid 1,000 mL (has no administration in time range)  enoxaparin (LOVENOX) injection 40 mg (has no administration in time range)  0.9 %  sodium chloride infusion (has no administration in time range)  acetaminophen (TYLENOL) tablet 650 mg (has no administration in time range)  Or  acetaminophen (TYLENOL) suppository 650 mg (has no administration in time range)  ondansetron (ZOFRAN) tablet 4 mg (has no administration in time range)    Or  ondansetron (ZOFRAN) injection 4 mg (has no administration in time range)  ipratropium-albuterol (DUONEB) 0.5-2.5 (3) MG/3ML nebulizer solution 3 mL (has no administration in time range)  dexamethasone (DECADRON) injection 10 mg (10 mg Intravenous Given 03/24/18 1711)  iopamidol (ISOVUE-370) 76 % injection 100 mL (100 mLs Intravenous Contrast Given 03/24/18 1901)  propofol (DIPRIVAN) 1000 MG/100ML infusion (0 mcg/kg/min  68 kg Intravenous Stopped 03/24/18 2152)  propofol (DIPRIVAN) 10 mg/mL bolus/IV push 40 mg (40 mg Intravenous Given 03/24/18 2016)  rocuronium (ZEMURON) injection 60 mg (60 mg Intravenous Given 03/24/18 2027)  propofol (DIPRIVAN) 10 mg/mL bolus/IV push 40 mg (40 mg Intravenous Given 03/24/18 2027)  piperacillin-tazobactam (ZOSYN) IVPB 3.375 g (3.375 g Intravenous New Bag/Given 03/24/18 2257)    Mobility non-ambulatory

## 2018-03-25 NOTE — Progress Notes (Signed)
Pharmacy Antibiotic Note  Kyle Alexander is a 57 y.o. male admitted on 03/24/2018 with sepsis.  Pharmacy has been consulted for Vancomycin and Zosyn dosing.  Plan: Zosyn 3.375g IV q8h (4 hour infusion).   Vancomycin 1gm iv x1, then 1gm iv q12hr  Goal AUC = 400 - 500 for all indications, except meningitis (goal AUC > 500 and Cmin 15-20 mcg/mL)   Height: 5\' 7"  (170.2 cm) Weight: 146 lb 2.6 oz (66.3 kg) IBW/kg (Calculated) : 66.1  Temp (24hrs), Avg:99.4 F (37.4 C), Min:98.9 F (37.2 C), Max:100.2 F (37.9 C)  Recent Labs  Lab 03/22/18 0744 03/24/18 1604 03/24/18 1610 03/24/18 2013 03/25/18 0323  WBC 20.1* 16.6*  --   --  15.3*  CREATININE 0.67* 0.60*  --   --  0.67  LATICACIDVEN  --   --  1.19 0.92  --     Estimated Creatinine Clearance: 98.7 mL/min (by C-G formula based on SCr of 0.67 mg/dL).    Allergies  Allergen Reactions  . Aspirin     Makes me bleed, says this happened when he was a child    Antimicrobials this admission: Vancomycin 03/24/2018 >> Zosyn 03/24/2018 >>   Dose adjustments this admission: -  Microbiology results: -  Thank you for allowing pharmacy to be a part of this patient's care.  Nani Skillern Crowford 03/25/2018 4:12 AM

## 2018-03-25 NOTE — Progress Notes (Signed)
[facility] Liaison:  RN visit  Notified by Joen Laura, of patient/family request for Hospice and [place] services at home after discharge.  Chart and patient information are being review with [facility] physician.  Hospice eligibility pending at this time.  Writer spoke with patient and sister, [name], at bedside to initiate education related to hospice philosophy, services and team approach to care.  Patient/family verbalized understanding of information given.  Per discussion, plan is for discharge to home by [facility] 03/26/18.  Please send signed and completed DNR form home with patient/family.  Patient will need prescriptions for discharge comfort medications.  DME needs have been discussed, patient currently has the following equipment in the home: O2 concentrator and suction.  Patient/family requests the following DME for delivery to the home:  None at this time.  Patient did advise that he needs gauze for trach and that he needs feeding supplements ordered, but that he does that himself.  Bedside RN advised that she will be able to provide gauze to bridge when he goes home.  He does still have supplies at home.    [facility] is aware of the above.  Completed discharge summary will need to be faxed to [facility] at [phone], when final.  Please notify [facility] when patient is ready to leave the unit at discharge.  (Call [phone] or [phone] if its after 5pm.)  [facility] information and contact numbers have been given to patient during visit.  Above information shared with Edwin Cap, [facility].  Please call with any hospice related questions.  Thank you for the referral.  Edyth Gunnels, RN, [facility] Liaison [phone]  All hospital liaisons are now on [place].

## 2018-03-25 NOTE — Progress Notes (Signed)
RT placed Pt on ATC 28%

## 2018-03-25 NOTE — Progress Notes (Signed)
[facility] PROGRESS NOTE    Progress Note  Kyle Alexander  KKX:[id number] DOB: [birth year] DOA: 03/24/2018 PCP: Clent Demark, PA-C     Brief Narrative:   Kyle Alexander is an 57 y.o. 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 courses, status post tracheostomy and PEG tube who presented with complaints of progressively worsening shortness of breath over the last week, with intermittent fevers  Assessment/Plan:   SIRS (systemic infla mmatory response syndrome) ([place]): With low-grade fever, tachycardia tachypnea and an elevated white blood count, which appears to be chronic in nature, the lactic acid is reassuring.  He has had a what elevated white blood cell count in the past. He was started empirically on IV Zosyn and vancomycin, I cannot find a clear source of infection her white count is improved from what it was 2 days ago. Will discontinue IV antibiotics cultures have remained negative. I have talked to him about comfort care, he has agreed to it we will start morphine and Ativan discontinue all other treatments will consult hospice and palliative care.  Acute on chronic respiratory failure with hypoxia (HCC) On arrival the patient's PCO2 was 50, patient tracheostomy tube was in the right lobe which could be contributing to his hypoxia and hypercarbia. CT angios was done that showed no acute findings. We will try to change him to a trach collar  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HCC) Continue dexamethasone narcotics for pain, will consult hospice and palliative care. He has agreed to move towards comfort care CT Angie of the neck showed progressive disease. He wants to go home tomorrow we will consult hospice and palliative care will restart transition to comfort measures.  Tracheostomy status ([place]) Tracheostomy change by ED physician to large cuff and reposition.  PEG  (percutaneous endoscopic gastrostomy) status ([place]) Continue feeding tube.     DVT prophylaxis: lovenox Family Communication:sister Disposition Plan/Barrier to D/C: home in am Code Status:     Code Status Orders  (From admission, onward)        Start     Ordered   03/24/18 2251  Do not attempt resuscitation (DNR)  Continuous    Question Answer Comment  In the event of cardiac or respiratory ARREST Do not call a "code blue"   In the event of cardiac or respiratory ARREST Do not perform Intubation, CPR, defibrillation or ACLS   In the event of cardiac or respiratory ARREST Use medication by any route, position, wound care, and other measures to relive pain and suffering. May use oxygen, suction and manual treatment of airway obstruction as needed for comfort.      03/24/18 2253    Code Status History    Date Active Date Inactive Code Status Order ID Comments User Context   03/26/2015 1630 04/07/2015 1949 Full Code [id number]  Lenn Cal, [place] [facility]   03/26/2015 1307 03/26/2015 1630 Full Code [id number]  Juluis Mire, MD Inpatient   03/16/2015 2012 03/17/2015 2122 Full Code [id number]  Markus Daft, MD Inpatient   03/15/2015 0959 03/16/2015 2012 Full Code [id number]  Juluis Mire, MD ED    Advance Directive Documentation     Most Recent Value  Type of Advance Directive  Living will, Out of facility DNR (pink MOST or yellow form)  Pre-existing out of facility DNR order (yellow form or pink MOST form)  -  "MOST" Form in Place?  -  IV Access:    Peripheral IV   Procedures and diagnostic studies:   Ct Soft Tissue Neck W Contrast  Result Date: 03/24/2018 CLINICAL DATA:  Throat pain EXAM: CT NECK WITH CONTRAST TECHNIQUE: Multidetector CT imaging of the neck was performed using the standard protocol following the bolus administration of intravenous contrast. CONTRAST:  149mL ISOVUE-370 IOPAMIDOL (ISOVUE-370) INJECTION 76% COMPARISON:  12/04/2017 FINDINGS: Pharynx and  larynx: Low-density mass centered at the larynx which measures up to 4.9 cm AP dimension. Mass grows upward into the glottis and anteriorly displaces the central compartment. Mass is indistinguishable from the esophageal verge and there is obstruction with secretions retained in the oropharynx and nasopharynx. Submucosal low-density thickening the level of the pharynx which may be related to inflammation or prior radiotherapy. Diffuse mucosal hyperenhancement. Salivary glands: Post treatment atrophy. No primary disease is seen. Adenopathy invades the right parotid tail. Thyroid: Atrophic with malignant invasion on the left. Lymph nodes: Previously identified necrotic adenopathy has progressed. Right upper jugular node has enlarged to 4.1 cm and invades the parotid tail. Low left jugular adenopathy has enlarged to 3.8 cm and invades the scalene musculature, in close proximity to the brachial plexus. Probable right lateral retropharyngeal adenopathy with ill-defined asymmetric masslike density. Vascular: No visible pseudoaneurysm. The lower left IJ is effaced by the adenopathy. The upper right IJ is occluded. Limited intracranial: Negative Visualized orbits: Negative Mastoids and visualized paranasal sinuses: Mild right mastoid opacification. Skeleton: No visible metastatic deposit. Upper chest: Centrilobular emphysema. IMPRESSION: 1. History of head and neck cancer with progression of residual disease in the central compartment and bilateral nodal neck. Central compartment tumor obstructs the larynx/pharynx and upper esophagus and there is retention of secretions to the level of the nasopharynx. Further description of tumor above. 2. Submucosal low-density thickening and mucosal hyperenhancement in the pharynx. Although these are findings seen with prior radiotherapy, there is progression from 11/2017 and there may be superimposed pharyngitis. Electronically Signed   By: Monte Fantasia M.D.   On: 03/24/2018 21:27    Ct Angio Chest Pe W And/or Wo Contrast  Result Date: 03/24/2018 CLINICAL DATA:  Shortness of breath. History of head and neck carcinoma EXAM: CT ANGIOGRAPHY CHEST WITH CONTRAST TECHNIQUE: Multidetector CT imaging of the chest was performed using the standard protocol during bolus administration of intravenous contrast. Multiplanar CT image reconstructions and MIPs were obtained to evaluate the vascular anatomy. CONTRAST:  142mL ISOVUE-370 IOPAMIDOL (ISOVUE-370) INJECTION 76% COMPARISON:  Chest CT December 13, 2016; chest radiograph March 24, 2018 FINDINGS: Cardiovascular: There is no demonstrable pulmonary embolus. There is no thoracic aortic aneurysm or dissection. Visualized great vessels appear normal. The right innominate and left common carotid arteries arise as a common trunk, an anatomic variant. There is no pericardial effusion or pericardial thickening. The main pulmonary outflow tract measures 3.2 cm in diameter, a finding concerning for a degree of underlying pulmonary arterial hypertension. Port-A-Cath tip is in the superior vena cava. Mediastinum/Nodes: There is medial left supraclavicular adenopathy measuring 3.3 x 2.7 cm. There is soft tissue mass throughout the mid to lower neck region surrounding the trachea. No appreciable normal thyroid tissue evident. No adenopathy is noted elsewhere in the thoracic region. There is soft tissue thickening throughout the mid esophagus with areas of fluid within the esophagus. Lungs/Pleura: There is a tracheostomy present with the tip in the upper thoracic trachea. Trachea and major bronchi appear intact. No pneumothorax. There is scarring in the lung apices. There is bibasilar lung atelectatic change. There is  no edema or consolidation. No pleural effusion or pleural thickening. No parenchymal lung nodular lesions are evident. Upper Abdomen: Visualized upper abdominal structures appear unremarkable. Musculoskeletal: There are no blastic or lytic bone lesions.  Review of the MIP images confirms the above findings. IMPRESSION: 1. No demonstrable pulmonary embolus. No thoracic aortic aneurysm or dissection. 2. Extensive tumor in the mid to lower neck. No normal thyroid seen. There is medial left supraclavicular adenopathy measuring 3.3 x 2.7 cm. No adenopathy elsewhere. 3.  Tracheostomy present.  No tracheal or bronchial lesion evident. 4. No lung edema or consolidation. No parenchymal lung mass. No pleural effusion. 5. Prominence of the main pulmonary outflow tract, a finding indicative of pulmonary arterial hypertension. 6. Esophageal wall thickening involving mid esophagus and fluid in mid esophagus. Electronically Signed   By: Lowella Grip III M.D.   On: 03/24/2018 21:00   Dg Chest Portable 1 View  Result Date: 03/24/2018 CLINICAL DATA:  Ongoing shortness of breath for 2 days, fever, tracheostomy, diminished lung sounds, hypoxemia EXAM: PORTABLE CHEST 1 VIEW COMPARISON:  Portable exam 1555 hours compared to 01/02/2016 FINDINGS: Tracheostomy tube present with tip angled towards the RIGHT lateral wall. RIGHT jugular Port-A-Cath with tip projecting over cavoatrial junction. Normal heart size, mediastinal contours, and pulmonary vascularity. Lungs clear. No pleural effusion or pneumothorax. IMPRESSION: Tip of tracheostomy tube is slightly deviated, projecting towards the RIGHT lateral tracheal wall. Otherwise negative exam. Electronically Signed   By: Lavonia Dana M.D.   On: 03/24/2018 16:22     Medical Consultants:    None.  Anti-Infectives:   none  Subjective:    [name] SOB is improved.  Objective:    Vitals:   03/25/18 0500 03/25/18 0600 03/25/18 0700 03/25/18 0729  BP: 117/84 119/75 117/84   Pulse: 78 91 98   Resp: 12 12 (!) 23   Temp:      TempSrc:      SpO2: 100% 99% 99% 100%  Weight:      Height:        Intake/Output Summary (Last 24 hours) at 03/25/2018 0732 Last data filed at 03/25/2018 0538 Gross per 24 hour  Intake  1031.73 ml  Output 540 ml  Net 491.73 ml   Filed Weights   03/25/18 0040  Weight: 66.3 kg (146 lb 2.6 oz)    Exam: General exam: In no acute distress, tach inn placed Respiratory system: Good air movement and clear to auscultation. Cardiovascular system: S1 & S2 heard, RRR.  Gastrointestinal system: Abdomen is nondistended, soft and nontender.  Central nervous system: Alert and oriented. No focal neurological deficits. Extremities: No pedal edema. Skin: No rashes, lesions or ulcers   Data Reviewed:    Labs: Basic Metabolic Panel: Recent Labs  Lab 03/22/18 0744 03/24/18 1604 03/25/18 0323  NA 139 137 136  K 3.7 3.9 4.0  CL 100 98* 100*  CO2 29 27 23   GLUCOSE 171* 89 116*  BUN 20 18 20   CREATININE 0.67* 0.60* 0.67  CALCIUM 9.2 8.9 8.7*   GFR Estimated Creatinine Clearance: 98.7 mL/min (by C-G formula based on SCr of 0.67 mg/dL). Liver Function Tests: Recent Labs  Lab 03/22/18 0744 03/24/18 1604  AST 16 19  ALT 49 41  ALKPHOS 69 62  BILITOT 0.2 0.9  PROT 6.1* 6.7  ALBUMIN 2.7* 3.0*   No results for input(s): LIPASE, AMYLASE in the last 168 hours. No results for input(s): AMMONIA in the last 168 hours. Coagulation profile No results for input(s):  INR, PROTIME in the last 168 hours.  CBC: Recent Labs  Lab 03/22/18 0744 03/24/18 1604 03/25/18 0323  WBC 20.1* 16.6* 15.3*  NEUTROABS 18.8* 15.3*  --   HGB  --  13.1 11.7*  HCT 36.8* 38.5* 35.9*  MCV 94.4 93.9 93.0  PLT 231 254 250   Cardiac Enzymes: No results for input(s): CKTOTAL, CKMB, CKMBINDEX, TROPONINI in the last 168 hours. BNP (last 3 results) No results for input(s): PROBNP in the last 8760 hours. CBG: No results for input(s): GLUCAP in the last 168 hours. D-Dimer: No results for input(s): DDIMER in the last 72 hours. Hgb A1c: No results for input(s): HGBA1C in the last 72 hours. Lipid Profile: No results for input(s): CHOL, HDL, LDLCALC, TRIG, CHOLHDL, LDLDIRECT in the last 72  hours. Thyroid function studies: Recent Labs    03/22/18 0744  TSH 1.104   Anemia work up: No results for input(s): VITAMINB12, FOLATE, FERRITIN, TIBC, IRON, RETICCTPCT in the last 72 hours. Sepsis Labs: Recent Labs  Lab 03/22/18 0744 03/24/18 1604 03/24/18 1610 03/24/18 2013 03/25/18 0323  WBC 20.1* 16.6*  --   --  15.3*  LATICACIDVEN  --   --  1.19 0.92  --    Microbiology Recent Results (from the past 240 hour(s))  MRSA PCR Screening     Status: None   Collection Time: 03/25/18 12:42 AM  Result Value Ref Range Status   MRSA by PCR NEGATIVE NEGATIVE Final    Comment:        The GeneXpert MRSA Assay (FDA approved for NASAL specimens only), is one component of a comprehensive MRSA colonization surveillance program. It is not intended to diagnose MRSA infection nor to guide or monitor treatment for MRSA infections. Performed at [facility], [place] [address], [place], [place] [address]      Medications:   . dexamethasone  4 mg Per Tube Daily  . enoxaparin (LOVENOX) injection  40 mg Subcutaneous QHS  . feeding supplement (JEVITY 1.5 CAL/FIBER)  240 mL Per Tube QID  . feeding supplement (OSMOLITE 1.2 CAL)  237 mL Per Tube QID  . fluticasone  2 spray Each Nare Daily  . folic acid  1 mg Per Tube Daily  . gabapentin  300 mg Per Tube TID  . iopamidol      . levothyroxine  75 mcg Oral QAC breakfast  . morphine  100 mg Oral Q8H  . pantoprazole  40 mg Oral Daily   Continuous Infusions: . sodium chloride 75 mL/hr at 03/25/18 0500  . famotidine (PEPCID) IV Stopped (03/25/18 0305)  . piperacillin-tazobactam (ZOSYN)  IV 3.375 g (03/25/18 0625)  . vancomycin        LOS: 0 days   Charlynne Cousins  [facility] Pager [phone]  *Please refer to [email or web address], password [email or web address] to get updated schedule on who will round on this patient, as hospitalists switch teams weekly. If 7PM-7AM, please contact night-coverage at [email or web address], password [email or web address] for  any overnight needs.  03/25/2018, 7:32 AM

## 2018-03-25 NOTE — Care Management Note (Addendum)
Case Management Note  Patient Details  Name: Kyle Alexander MRN: [id number] Date of Birth: 1961/06/14  Subjective/Objective:  Squamous cell carcinoma of supraglottis,              Action/Plan: NCM spoke to pt and sister, Kyle Alexander [phone] at bedside. Pt currently lives in a room at [facility]. Pt gets his Trach and PEG supplies from [facility]. Also has oxygen with [facility]. Offered choice for Home Hospice/list provided. Pt/sister requesting [facility]. [place] rep to arrange Home Hospice for planned dc tomorrow. Will need [facility] transport.  Expected Discharge Date:                  Expected Discharge Plan:  Home w Hospice Care  In-House Referral:  Hospice / Palliative Care  Discharge planning Services  CM Consult  Post Acute Care Choice:  Hospice Choice offered to:  Patient, Sibling  DME Arranged:  Other see comment DME Agency:  [place]:  RN [facility] Agency:  [facility]  Status of Service:  Completed, signed off  If discussed at [place] of Stay Meetings, dates discussed:    Additional Comments:  Erenest Rasher, RN 03/25/2018, 2:53 PM

## 2018-03-26 ENCOUNTER — Telehealth: Payer: Self-pay

## 2018-03-26 DIAGNOSIS — Z66 Do not resuscitate: Secondary | ICD-10-CM

## 2018-03-26 DIAGNOSIS — C321 Malignant neoplasm of supraglottis: Secondary | ICD-10-CM

## 2018-03-26 DIAGNOSIS — R0602 Shortness of breath: Secondary | ICD-10-CM

## 2018-03-26 LAB — CREATININE, SERUM
Creatinine, Ser: 0.38 mg/dL — ABNORMAL LOW (ref 0.61–1.24)
GFR calc Af Amer: >60 mL/min (ref >60)
GFR calc non Af Amer: >60 mL/min (ref >60)

## 2018-03-26 LAB — URINE CULTURE: Culture: NO GROWTH

## 2018-03-26 MED ORDER — MORPHINE SULFATE 30 MG PO TABS
90.0000 mg | ORAL_TABLET | ORAL | Status: DC | PRN
  Administered 2018-03-26: 90 mg via ORAL
  Filled 2018-03-26 (×2): qty 3

## 2018-03-26 NOTE — Progress Notes (Signed)
Spoke to [place] will assess patient for #6 shiley after the 1st home visit-[facility] will provide all dme-futher trach supplies(gauze)/PEG supplies to home-noted script for Jevity. Nsg here will provide patient w/a #6 shiley to take home. [facility] dme rep Brenton Grills will bring home 02 travel tank to rm prior d/c.Nsg updated. No further CM needs.

## 2018-03-26 NOTE — Progress Notes (Signed)
Received call from nsg about patient concerns-dme-gauze for trach-nsg to provide gauze,& [facility] home nurse will bring gauze on home visit-trach size now #6-I have texted [facility] liason about having #6 shiley @ home-await response-[facility] will deliver home 02 travel tank to rm prior d/c. All other dme to be delivered to home-noted script for jevity ordered.Family will transport home on own.Nsg updated.

## 2018-03-26 NOTE — Telephone Encounter (Signed)
[facility] called and left message asking if Dr. Alvy Bimler would be the attending.  Called back and spoke with Amy. Told her that Dr. Alvy Bimler would be the attending.

## 2018-03-26 NOTE — Discharge Summary (Signed)
Physician Discharge Summary  Kyle Alexander VEL:[id number] DOB: [birth year] DOA: 03/24/2018  PCP: Clent Demark, PA-C  Admit date: 03/24/2018 Discharge date: 03/26/2018  Admitted From: home Disposition:  Home  Recommendations for Outpatient Follow-up:  1. Follow up with PCP in 1-2 weeks Will go home with hospice.  Home Health:No Equipment/Devices:none  Discharge Condition:guarded CODE STATUS:DNR Diet recommendation: cont tube feeding  Brief/Interim Summary: 57 y.o. 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 courses, status post tracheostomy and PEG tube who presented with complaints of progressively worsening shortness of breath over the last week, with intermittent fevers   Discharge Diagnoses:  Principal Problem:   Acute on chronic respiratory failure with hypoxia ([place]) Active Problems:   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HCC)   Tracheostomy status (HCC)   PEG (percutaneous endoscopic gastrostomy) status ([place])   SIRS (systemic inflammatory response syndrome) ([place])   Respiratory failure ([place])  Sirs: Had a low-grade fever tachycardia tachypnea but no source of infection identified on admission he was started on IV empiric antibiotics which were held culture data remain negative he was monitored overnight and he remained stable.  Acute on chronic respiratory failure with hypoxia: On arrival his PCO2 was 50 patient was placed on vent support and his hypercarbia and hypoxia improved after suctioning. CT Angie was done that showed no acute findings.  He was transitioned to a trach collar which she tolerated.  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 Hospice imperative care was consulted and the patient decided to move towards comfort care.  PEG tube: Continue tube feedings.  Discharge Instructions  Discharge Instructions    Diet -  low sodium heart healthy   Complete by:  As directed    Increase activity slowly   Complete by:  As directed      Allergies as of 03/26/2018      Reactions   Aspirin    Makes me bleed, says this happened when he was a child      Medication List    STOP taking these medications   folic acid 1 MG tablet Commonly known as:  FOLVITE   lactulose 10 GM/15ML solution Commonly known as:  CHRONULAC   sodium chloride irrigation 0.9 % irrigation     TAKE these medications   dexamethasone 4 MG tablet Commonly known as:  DECADRON TAKE 1 TABLET BY MOUTH DAILY.   feeding supplement (JEVITY 1.5 CAL/FIBER) Liqd Give 1 can Osmolite 1.2 + 1 can of Jevity 1.5 QID via PEG with 60 cc free water before and after bolus. Flush with 240 cc free water BID between feedings.   gabapentin 300 MG capsule Commonly known as:  NEURONTIN Place 1 capsule (300 mg total) 3 (three) times daily into feeding tube.   levothyroxine 75 MCG tablet Commonly known as:  SYNTHROID Take 1 tablet (75 mcg total) by mouth daily before breakfast.   morphine 100 MG 12 hr tablet Commonly known as:  MS CONTIN Take 1 tablet (100 mg total) by mouth every 8 (eight) hours.   morphine 30 MG tablet Commonly known as:  MSIR Take 3 tablets (90 mg total) by mouth every 4 (four) hours as needed for severe pain.   omeprazole 40 MG capsule Commonly known as:  PRILOSEC Take 1 capsule (40 mg total) by mouth daily.   ondansetron 8 MG tablet Commonly known as:  ZOFRAN TAKE 1 TABLET BY MOUTH EVERY  8 HOURS AS NEEDED FOR NAUSEA      Follow-up Information    [facility]  Follow up.   Why:  Home Hospice RN- will contact you to arrange initial visit Contact information: [place] [id number]         Allergies  Allergen Reactions  . Aspirin     Makes me bleed, says this happened when he was a child    Consultations:  PMT   Procedures/Studies: Ct Soft Tissue Neck  W Contrast  Result Date: 03/24/2018 CLINICAL DATA:  Throat pain EXAM: CT NECK WITH CONTRAST TECHNIQUE: Multidetector CT imaging of the neck was performed using the standard protocol following the bolus administration of intravenous contrast. CONTRAST:  173mL ISOVUE-370 IOPAMIDOL (ISOVUE-370) INJECTION 76% COMPARISON:  12/04/2017 FINDINGS: Pharynx and larynx: Low-density mass centered at the larynx which measures up to 4.9 cm AP dimension. Mass grows upward into the glottis and anteriorly displaces the central compartment. Mass is indistinguishable from the esophageal verge and there is obstruction with secretions retained in the oropharynx and nasopharynx. Submucosal low-density thickening the level of the pharynx which may be related to inflammation or prior radiotherapy. Diffuse mucosal hyperenhancement. Salivary glands: Post treatment atrophy. No primary disease is seen. Adenopathy invades the right parotid tail. Thyroid: Atrophic with malignant invasion on the left. Lymph nodes: Previously identified necrotic adenopathy has progressed. Right upper jugular node has enlarged to 4.1 cm and invades the parotid tail. Low left jugular adenopathy has enlarged to 3.8 cm and invades the scalene musculature, in close proximity to the brachial plexus. Probable right lateral retropharyngeal adenopathy with ill-defined asymmetric masslike density. Vascular: No visible pseudoaneurysm. The lower left IJ is effaced by the adenopathy. The upper right IJ is occluded. Limited intracranial: Negative Visualized orbits: Negative Mastoids and visualized paranasal sinuses: Mild right mastoid opacification. Skeleton: No visible metastatic deposit. Upper chest: Centrilobular emphysema. IMPRESSION: 1. History of head and neck cancer with progression of residual disease in the central compartment and bilateral nodal neck. Central compartment tumor obstructs the larynx/pharynx and upper esophagus and there is retention of secretions to  the level of the nasopharynx. Further description of tumor above. 2. Submucosal low-density thickening and mucosal hyperenhancement in the pharynx. Although these are findings seen with prior radiotherapy, there is progression from 11/2017 and there may be superimposed pharyngitis. Electronically Signed   By: Monte Fantasia M.D.   On: 03/24/2018 21:27   Ct Angio Chest Pe W And/or Wo Contrast  Result Date: 03/24/2018 CLINICAL DATA:  Shortness of breath. History of head and neck carcinoma EXAM: CT ANGIOGRAPHY CHEST WITH CONTRAST TECHNIQUE: Multidetector CT imaging of the chest was performed using the standard protocol during bolus administration of intravenous contrast. Multiplanar CT image reconstructions and MIPs were obtained to evaluate the vascular anatomy. CONTRAST:  12mL ISOVUE-370 IOPAMIDOL (ISOVUE-370) INJECTION 76% COMPARISON:  Chest CT December 13, 2016; chest radiograph March 24, 2018 FINDINGS: Cardiovascular: There is no demonstrable pulmonary embolus. There is no thoracic aortic aneurysm or dissection. Visualized great vessels appear normal. The right innominate and left common carotid arteries arise as a common trunk, an anatomic variant. There is no pericardial effusion or pericardial thickening. The main pulmonary outflow tract measures 3.2 cm in diameter, a finding concerning for a degree of underlying pulmonary arterial hypertension. Port-A-Cath tip is in the superior vena cava. Mediastinum/Nodes: There is medial left supraclavicular adenopathy measuring 3.3 x 2.7 cm. There is soft tissue mass throughout the mid to lower neck region  surrounding the trachea. No appreciable normal thyroid tissue evident. No adenopathy is noted elsewhere in the thoracic region. There is soft tissue thickening throughout the mid esophagus with areas of fluid within the esophagus. Lungs/Pleura: There is a tracheostomy present with the tip in the upper thoracic trachea. Trachea and major bronchi appear intact. No  pneumothorax. There is scarring in the lung apices. There is bibasilar lung atelectatic change. There is no edema or consolidation. No pleural effusion or pleural thickening. No parenchymal lung nodular lesions are evident. Upper Abdomen: Visualized upper abdominal structures appear unremarkable. Musculoskeletal: There are no blastic or lytic bone lesions. Review of the MIP images confirms the above findings. IMPRESSION: 1. No demonstrable pulmonary embolus. No thoracic aortic aneurysm or dissection. 2. Extensive tumor in the mid to lower neck. No normal thyroid seen. There is medial left supraclavicular adenopathy measuring 3.3 x 2.7 cm. No adenopathy elsewhere. 3.  Tracheostomy present.  No tracheal or bronchial lesion evident. 4. No lung edema or consolidation. No parenchymal lung mass. No pleural effusion. 5. Prominence of the main pulmonary outflow tract, a finding indicative of pulmonary arterial hypertension. 6. Esophageal wall thickening involving mid esophagus and fluid in mid esophagus. Electronically Signed   By: Lowella Grip III M.D.   On: 03/24/2018 21:00   Dg Chest Portable 1 View  Result Date: 03/24/2018 CLINICAL DATA:  Ongoing shortness of breath for 2 days, fever, tracheostomy, diminished lung sounds, hypoxemia EXAM: PORTABLE CHEST 1 VIEW COMPARISON:  Portable exam 1555 hours compared to 01/02/2016 FINDINGS: Tracheostomy tube present with tip angled towards the RIGHT lateral wall. RIGHT jugular Port-A-Cath with tip projecting over cavoatrial junction. Normal heart size, mediastinal contours, and pulmonary vascularity. Lungs clear. No pleural effusion or pneumothorax. IMPRESSION: Tip of tracheostomy tube is slightly deviated, projecting towards the RIGHT lateral tracheal wall. Otherwise negative exam. Electronically Signed   By: Lavonia Dana M.D.   On: 03/24/2018 16:22     Subjective: No complains feels great  Discharge Exam: Vitals:   03/26/18 0600 03/26/18 0743  BP: 123/89 92/69   Pulse: 72 70  Resp: (!) 8 14  Temp:    SpO2: 100% 100%   Vitals:   03/26/18 0400 03/26/18 0500 03/26/18 0600 03/26/18 0743  BP: (!) 146/118 109/78 123/89 92/69  Pulse: (!) 101 74 72 70  Resp: (!) 21 11 (!) 8 14  Temp:      TempSrc:      SpO2: (!) 88% 100% 100% 100%  Weight:      Height:        General: Pt is alert, awake, not in acute distress Cardiovascular: RRR, S1/S2 +, no rubs, no gallops Respiratory: CTA bilaterally, no wheezing, no rhonchi Abdominal: Soft, NT, ND, bowel sounds + Extremities: no edema, no cyanosis    The results of significant diagnostics from this hospitalization (including imaging, microbiology, ancillary and laboratory) are listed below for reference.     Microbiology: Recent Results (from the past 240 hour(s))  Culture, blood (Routine X 2) w Reflex to ID Panel     Status: None (Preliminary result)   Collection Time: 03/24/18  7:51 PM  Result Value Ref Range Status   Specimen Description   Final    BLOOD LEFT HAND Performed at [facility] [address], [place], [place] [address]    Special Requests   Final    BOTTLES DRAWN AEROBIC AND ANAEROBIC Blood Culture adequate volume Performed at [facility] [address] [place], [place]  [address]    Culture   Final    NO GROWTH < 12 HOURS Performed at [facility] [address] [place], [place] [address]    Report Status PENDING  Incomplete  Culture, blood (Routine X 2) w Reflex to ID Panel     Status: None (Preliminary result)   Collection Time: 03/24/18  7:56 PM  Result Value Ref Range Status   Specimen Description   Final    BLOOD RIGHT ANTECUBITAL Performed at [facility] [address], [place], [place] [address]    Special Requests   Final    BOTTLES DRAWN AEROBIC AND ANAEROBIC Blood Culture adequate volume Performed at [facility] [address], [place], [place] [address]    Culture    Final    NO GROWTH < 12 HOURS Performed at [facility] [address], [place], [place] [address]    Report Status PENDING  Incomplete  MRSA PCR Screening     Status: None   Collection Time: 03/25/18 12:42 AM  Result Value Ref Range Status   MRSA by PCR NEGATIVE NEGATIVE Final    Comment:        The GeneXpert MRSA Assay (FDA approved for NASAL specimens only), is one component of a comprehensive MRSA colonization surveillance program. It is not intended to diagnose MRSA infection nor to guide or monitor treatment for MRSA infections. Performed at [facility], [place] [address], [place], [place] [address]      Labs: BNP (last 3 results) Recent Labs    07/24/17 1230  BNP 35.5   Basic Metabolic Panel: Recent Labs  Lab 03/22/18 0744 03/24/18 1604 03/25/18 0323 03/26/18 0258  NA 139 137 136  --   K 3.7 3.9 4.0  --   CL 100 98* 100*  --   CO2 29 27 23   --   GLUCOSE 171* 89 116*  --   BUN 20 18 20   --   CREATININE 0.67* 0.60* 0.67 0.38*  CALCIUM 9.2 8.9 8.7*  --    Liver Function Tests: Recent Labs  Lab 03/22/18 0744 03/24/18 1604  AST 16 19  ALT 49 41  ALKPHOS 69 62  BILITOT 0.2 0.9  PROT 6.1* 6.7  ALBUMIN 2.7* 3.0*   No results for input(s): LIPASE, AMYLASE in the last 168 hours. No results for input(s): AMMONIA in the last 168 hours. CBC: Recent Labs  Lab 03/22/18 0744 03/24/18 1604 03/25/18 0323  WBC 20.1* 16.6* 15.3*  NEUTROABS 18.8* 15.3*  --   HGB  --  13.1 11.7*  HCT 36.8* 38.5* 35.9*  MCV 94.4 93.9 93.0  PLT 231 254 250   Cardiac Enzymes: No results for input(s): CKTOTAL, CKMB, CKMBINDEX, TROPONINI in the last 168 hours. BNP: Invalid input(s): POCBNP CBG: No results for input(s): GLUCAP in the last 168 hours. D-Dimer No results for input(s): DDIMER in the last 72 hours. Hgb A1c No results for input(s): HGBA1C in the last 72 hours. Lipid Profile No results for input(s): CHOL, HDL, LDLCALC, TRIG, CHOLHDL,  LDLDIRECT in the last 72 hours. Thyroid function studies No results for input(s): TSH, T4TOTAL, T3FREE, THYROIDAB in the last 72 hours.  Invalid input(s): FREET3 Anemia work up No results for input(s): VITAMINB12, FOLATE, FERRITIN, TIBC, IRON, RETICCTPCT in the last 72 hours. Urinalysis    Component Value Date/Time   COLORURINE YELLOW 03/24/2018 2109   APPEARANCEUR CLEAR 03/24/2018 2109   LABSPEC 1.013 03/24/2018 2109  PHURINE 7.0 03/24/2018 2109   GLUCOSEU NEGATIVE 03/24/2018 2109   HGBUR SMALL (A) 03/24/2018 2109   BILIRUBINUR NEGATIVE 03/24/2018 2109   KETONESUR 5 (A) 03/24/2018 2109   PROTEINUR NEGATIVE 03/24/2018 2109   UROBILINOGEN 2.0 (H) 03/15/2015 1533   NITRITE NEGATIVE 03/24/2018 2109   LEUKOCYTESUR NEGATIVE 03/24/2018 2109   Sepsis Labs Invalid input(s): PROCALCITONIN,  WBC,  LACTICIDVEN Microbiology Recent Results (from the past 240 hour(s))  Culture, blood (Routine X 2) w Reflex to ID Panel     Status: None (Preliminary result)   Collection Time: 03/24/18  7:51 PM  Result Value Ref Range Status   Specimen Description   Final    BLOOD LEFT HAND Performed at [facility], [place] [address] [place], [place] [address]    Special Requests   Final    BOTTLES DRAWN AEROBIC AND ANAEROBIC Blood Culture adequate volume Performed at [facility] [address] [place], [place] [address]    Culture   Final    NO GROWTH < 12 HOURS Performed at [facility] [address] [place], [place] [address]    Report Status PENDING  Incomplete  Culture, blood (Routine X 2) w Reflex to ID Panel     Status: None (Preliminary result)   Collection Time: 03/24/18  7:56 PM  Result Value Ref Range Status   Specimen Description   Final    BLOOD RIGHT ANTECUBITAL Performed at [facility] [address], [place], [place] [address]    Special Requests   Final    BOTTLES DRAWN AEROBIC AND ANAEROBIC Blood Culture  adequate volume Performed at [facility] [address] [place], [place] [address]    Culture   Final    NO GROWTH < 12 HOURS Performed at [facility] [address], [place], [place] [address]    Report Status PENDING  Incomplete  MRSA PCR Screening     Status: None   Collection Time: 03/25/18 12:42 AM  Result Value Ref Range Status   MRSA by PCR NEGATIVE NEGATIVE Final    Comment:        The GeneXpert MRSA Assay (FDA approved for NASAL specimens only), is one component of a comprehensive MRSA colonization surveillance program. It is not intended to diagnose MRSA infection nor to guide or monitor treatment for MRSA infections. Performed at [facility], [place] [address], [place], [place] [address]      Time coordinating discharge: 35 minutes  SIGNED:   Charlynne Cousins, MD  [facility] 03/26/2018, 7:54 AM Pager   If 7PM-7AM, please contact night-coverage [email or web address] Password [email or web address]

## 2018-03-26 NOTE — Progress Notes (Signed)
[facility] Liaison:  RN visit  Notified by Joen Laura, of patient/family request for Hospice and [facility] services at home after discharge.  Chart and patient information has been reviewed. Hospice eligibly approved by Dr. Ria Clock spoke with patient and sister, [name], at bedside to initiate education related to hospice philosophy, services and team approach to care.  Patient/family verbalized understanding of information given.  Per discussion, plan is for discharge to home by [facility] 03/26/18.  Please send signed and completed DNR form home with patient/family.  Patient will need prescriptions for discharge comfort medications.  DME needs have been discussed, patient currently has the following equipment in the home: O2 concentrator and suction.  Patient/family requests the following DME for delivery to the home:  None at this time.  Patient did advise that he needs gauze for trach and that he needs feeding supplements ordered, but that he does that himself.  Bedside RN advised that she will be able to provide gauze to bridge when he goes home.  He does still have supplies at home.    [facility] is aware of the above.  Completed discharge summary will need to be faxed to [facility] at [phone], when final.  Please notify [facility] when patient is ready to leave the unit at discharge.  (Call [phone] or [phone] if its after 5pm.)  [facility] information and contact numbers have been given to patient during visit.  Above information shared with Edwin Cap, [facility].  Please call with any hospice related questions.  Thank you for the referral.  Edyth Gunnels, RN, [facility] Liaison [phone]  All hospital liaisons are now on [place]

## 2018-03-26 NOTE — Progress Notes (Signed)
Kyle Alexander   DOB:Nov 22, 1961   UU#:[id number]    Assessment & Plan:   Squamous cell carcinoma of the supraglottis The patient is aware I have nothing further to offer from systemic chemotherapy standpoint. He has accepted palliative care and hospice service.  He has elected for home hospice care  CODE STATUS DO NOT RESUSCITATE  Discharge planning Hopefully, he can be discharged home after being established with home based palliative care/hospice I have canceled all his outpatient appointment  [name], MD 03/26/2018  8:11 AM   Subjective:  I communicated with the patient through written information.  Noted he was admitted due to shortness of breath.  CT scan showed disease progression which is not unexpected.  His sister was present  Objective:  Vitals:   03/26/18 0600 03/26/18 0743  BP: 123/89 92/69  Pulse: 72 70  Resp: (!) 8 14  Temp:    SpO2: 100% 100%     Intake/Output Summary (Last 24 hours) at 03/26/2018 0811 Last data filed at 03/26/2018 0000 Gross per 24 hour  Intake 2393 ml  Output 700 ml  Net 1693 ml    GENERAL:alert, no distress and comfortable   Labs:  Lab Results  Component Value Date   WBC 15.3 (H) 03/25/2018   HGB 11.7 (L) 03/25/2018   HCT 35.9 (L) 03/25/2018   MCV 93.0 03/25/2018   PLT 250 03/25/2018   NEUTROABS 15.3 (H) 03/24/2018    Lab Results  Component Value Date   NA 136 03/25/2018   K 4.0 03/25/2018   CL 100 (L) 03/25/2018   CO2 23 03/25/2018    Studies:  Ct Soft Tissue Neck W Contrast  Result Date: 03/24/2018 CLINICAL DATA:  Throat pain EXAM: CT NECK WITH CONTRAST TECHNIQUE: Multidetector CT imaging of the neck was performed using the standard protocol following the bolus administration of intravenous contrast. CONTRAST:  157mL ISOVUE-370 IOPAMIDOL (ISOVUE-370) INJECTION 76% COMPARISON:  12/04/2017 FINDINGS: Pharynx and larynx: Low-density mass centered at the larynx which measures up to 4.9 cm AP dimension. Mass grows upward  into the glottis and anteriorly displaces the central compartment. Mass is indistinguishable from the esophageal verge and there is obstruction with secretions retained in the oropharynx and nasopharynx. Submucosal low-density thickening the level of the pharynx which may be related to inflammation or prior radiotherapy. Diffuse mucosal hyperenhancement. Salivary glands: Post treatment atrophy. No primary disease is seen. Adenopathy invades the right parotid tail. Thyroid: Atrophic with malignant invasion on the left. Lymph nodes: Previously identified necrotic adenopathy has progressed. Right upper jugular node has enlarged to 4.1 cm and invades the parotid tail. Low left jugular adenopathy has enlarged to 3.8 cm and invades the scalene musculature, in close proximity to the brachial plexus. Probable right lateral retropharyngeal adenopathy with ill-defined asymmetric masslike density. Vascular: No visible pseudoaneurysm. The lower left IJ is effaced by the adenopathy. The upper right IJ is occluded. Limited intracranial: Negative Visualized orbits: Negative Mastoids and visualized paranasal sinuses: Mild right mastoid opacification. Skeleton: No visible metastatic deposit. Upper chest: Centrilobular emphysema. IMPRESSION: 1. History of head and neck cancer with progression of residual disease in the central compartment and bilateral nodal neck. Central compartment tumor obstructs the larynx/pharynx and upper esophagus and there is retention of secretions to the level of the nasopharynx. Further description of tumor above. 2. Submucosal low-density thickening and mucosal hyperenhancement in the pharynx. Although these are findings seen with prior radiotherapy, there is progression from 11/2017 and there may be superimposed pharyngitis. Electronically Signed  By: Monte Fantasia M.D.   On: 03/24/2018 21:27   Ct Angio Chest Pe W And/or Wo Contrast  Result Date: 03/24/2018 CLINICAL DATA:  Shortness of breath.  History of head and neck carcinoma EXAM: CT ANGIOGRAPHY CHEST WITH CONTRAST TECHNIQUE: Multidetector CT imaging of the chest was performed using the standard protocol during bolus administration of intravenous contrast. Multiplanar CT image reconstructions and MIPs were obtained to evaluate the vascular anatomy. CONTRAST:  13mL ISOVUE-370 IOPAMIDOL (ISOVUE-370) INJECTION 76% COMPARISON:  Chest CT December 13, 2016; chest radiograph March 24, 2018 FINDINGS: Cardiovascular: There is no demonstrable pulmonary embolus. There is no thoracic aortic aneurysm or dissection. Visualized great vessels appear normal. The right innominate and left common carotid arteries arise as a common trunk, an anatomic variant. There is no pericardial effusion or pericardial thickening. The main pulmonary outflow tract measures 3.2 cm in diameter, a finding concerning for a degree of underlying pulmonary arterial hypertension. Port-A-Cath tip is in the superior vena cava. Mediastinum/Nodes: There is medial left supraclavicular adenopathy measuring 3.3 x 2.7 cm. There is soft tissue mass throughout the mid to lower neck region surrounding the trachea. No appreciable normal thyroid tissue evident. No adenopathy is noted elsewhere in the thoracic region. There is soft tissue thickening throughout the mid esophagus with areas of fluid within the esophagus. Lungs/Pleura: There is a tracheostomy present with the tip in the upper thoracic trachea. Trachea and major bronchi appear intact. No pneumothorax. There is scarring in the lung apices. There is bibasilar lung atelectatic change. There is no edema or consolidation. No pleural effusion or pleural thickening. No parenchymal lung nodular lesions are evident. Upper Abdomen: Visualized upper abdominal structures appear unremarkable. Musculoskeletal: There are no blastic or lytic bone lesions. Review of the MIP images confirms the above findings. IMPRESSION: 1. No demonstrable pulmonary embolus. No  thoracic aortic aneurysm or dissection. 2. Extensive tumor in the mid to lower neck. No normal thyroid seen. There is medial left supraclavicular adenopathy measuring 3.3 x 2.7 cm. No adenopathy elsewhere. 3.  Tracheostomy present.  No tracheal or bronchial lesion evident. 4. No lung edema or consolidation. No parenchymal lung mass. No pleural effusion. 5. Prominence of the main pulmonary outflow tract, a finding indicative of pulmonary arterial hypertension. 6. Esophageal wall thickening involving mid esophagus and fluid in mid esophagus. Electronically Signed   By: Lowella Grip III M.D.   On: 03/24/2018 21:00   Dg Chest Portable 1 View  Result Date: 03/24/2018 CLINICAL DATA:  Ongoing shortness of breath for 2 days, fever, tracheostomy, diminished lung sounds, hypoxemia EXAM: PORTABLE CHEST 1 VIEW COMPARISON:  Portable exam 1555 hours compared to 01/02/2016 FINDINGS: Tracheostomy tube present with tip angled towards the RIGHT lateral wall. RIGHT jugular Port-A-Cath with tip projecting over cavoatrial junction. Normal heart size, mediastinal contours, and pulmonary vascularity. Lungs clear. No pleural effusion or pneumothorax. IMPRESSION: Tip of tracheostomy tube is slightly deviated, projecting towards the RIGHT lateral tracheal wall. Otherwise negative exam. Electronically Signed   By: Lavonia Dana M.D.   On: 03/24/2018 16:22

## 2018-03-27 ENCOUNTER — Other Ambulatory Visit: Payer: Self-pay | Admitting: Hematology and Oncology

## 2018-03-29 LAB — CULTURE, BLOOD (ROUTINE X 2)
Culture: NO GROWTH
Culture: NO GROWTH
SPECIAL REQUESTS: ADEQUATE
Special Requests: ADEQUATE

## 2018-03-29 MED FILL — METHADONE HCL 5 MG TABS: 5 | 30 days supply | Qty: 90 | Fill #0

## 2018-04-04 ENCOUNTER — Other Ambulatory Visit (HOSPITAL_COMMUNITY): Payer: Self-pay | Admitting: Interventional Radiology

## 2018-04-04 DIAGNOSIS — R1310 Dysphagia, unspecified: Secondary | ICD-10-CM

## 2018-04-05 ENCOUNTER — Ambulatory Visit (HOSPITAL_COMMUNITY): Payer: Medicaid Other

## 2018-04-05 MED FILL — OMEPRAZOLE DR 40 MG CAPSULE: 40 | 30 days supply | Qty: 60 | Fill #3

## 2018-04-05 MED FILL — DEXAMETHASONE 4 MG TABLET: 4 | 30 days supply | Qty: 30 | Fill #1

## 2018-04-05 MED FILL — LEVOTHYROXINE 75 MCG TABLET: 75 | 30 days supply | Qty: 30 | Fill #1

## 2018-04-05 MED FILL — LACTULOSE 10 GM/15 ML SOLUT: 10 | 10 days supply | Qty: 473 | Fill #1

## 2018-04-09 ENCOUNTER — Other Ambulatory Visit: Payer: Self-pay | Admitting: *Deleted

## 2018-04-09 ENCOUNTER — Other Ambulatory Visit: Payer: Self-pay | Admitting: Hematology and Oncology

## 2018-04-09 DIAGNOSIS — G893 Neoplasm related pain (acute) (chronic): Secondary | ICD-10-CM

## 2018-04-09 MED ORDER — MORPHINE SULFATE 30 MG PO TABS: 90.0000 mg | ORAL_TABLET | ORAL | 0 refills | Status: AC | PRN

## 2018-04-09 MED FILL — MORPHINE SULFATE IR 30 MG T: 30 | 10 days supply | Qty: 60 | Fill #0

## 2018-04-09 NOTE — Telephone Encounter (Signed)
Faxed refill of MS IR 30mg  to [facility]

## 2018-04-09 NOTE — Telephone Encounter (Signed)
Does he need to refill now? He is enrolled with hospice

## 2018-04-12 ENCOUNTER — Other Ambulatory Visit: Payer: Self-pay

## 2018-04-12 ENCOUNTER — Ambulatory Visit: Payer: Self-pay | Admitting: Hematology and Oncology

## 2018-04-12 ENCOUNTER — Ambulatory Visit: Payer: Self-pay

## 2018-04-12 ENCOUNTER — Other Ambulatory Visit (HOSPITAL_COMMUNITY): Payer: Self-pay | Admitting: Radiology

## 2018-04-12 DIAGNOSIS — R633 Feeding difficulties, unspecified: Secondary | ICD-10-CM

## 2018-04-13 ENCOUNTER — Telehealth: Payer: Self-pay

## 2018-04-13 NOTE — Telephone Encounter (Signed)
Hospice nurse Clarene Critchley called from [phone] and would like to ask Dr Alvy Bimler if the change from Levothyroxine 75 mcg from 100 Mcg would cause increased oral secretions?  Called her back and she has spoke with Dr Antonieta Loveless - hospice physician said it would not effect oral secretions per her. The nurse has reached out to pt and said he reports he is drooling more and thinks it may be the position of his neck, drawn more downward and to his left. The patient was just asking about the change in Synthroid possibly causing the increased secretions. Per hospice nurse, any  Suggestions for him with this.  Routing note to Dr Alvy Bimler.

## 2018-04-16 ENCOUNTER — Encounter (HOSPITAL_COMMUNITY): Payer: Self-pay | Admitting: Interventional Radiology

## 2018-04-16 ENCOUNTER — Other Ambulatory Visit (HOSPITAL_COMMUNITY): Payer: Self-pay | Admitting: Radiology

## 2018-04-16 ENCOUNTER — Ambulatory Visit (HOSPITAL_COMMUNITY)
Admission: RE | Admit: 2018-04-16 | Discharge: 2018-04-16 | Disposition: A | Discharge status: Home / Self Care | Payer: Medicaid Other | Source: Ambulatory Visit | Attending: Radiology | Admitting: Radiology

## 2018-04-16 DIAGNOSIS — K9423 Gastrostomy malfunction: Secondary | ICD-10-CM | POA: Insufficient documentation

## 2018-04-16 DIAGNOSIS — R633 Feeding difficulties, unspecified: Secondary | ICD-10-CM

## 2018-04-16 HISTORY — PX: IR REPLACE G-TUBE SIMPLE WO FLUORO: IMG2323

## 2018-04-16 NOTE — Telephone Encounter (Signed)
Tell hospice his drooling is due to disease progression in his mouth/throat, not from medications His condition is terminal

## 2018-04-16 NOTE — Procedures (Signed)
Patient's 20 french balloon retention gastrostomy tube was exchanged out for new 20 French balloon gastrostomy tube without immediate complications.

## 2018-04-19 ENCOUNTER — Ambulatory Visit: Payer: Self-pay

## 2018-04-19 ENCOUNTER — Other Ambulatory Visit: Payer: Self-pay

## 2018-04-19 MED FILL — MORPHINE SULFATE IR 30 MG T: 30 | 10 days supply | Qty: 60 | Fill #0

## 2018-04-21 ENCOUNTER — Emergency Department (HOSPITAL_COMMUNITY)
Admission: EM | Admit: 2018-04-21 | Discharge: 2018-04-22 | Disposition: A | Discharge status: Home / Self Care | Payer: Medicaid Other | Attending: Emergency Medicine | Admitting: Emergency Medicine

## 2018-04-21 DIAGNOSIS — Z8501 Personal history of malignant neoplasm of esophagus: Secondary | ICD-10-CM | POA: Insufficient documentation

## 2018-04-21 DIAGNOSIS — Z87891 Personal history of nicotine dependence: Secondary | ICD-10-CM | POA: Insufficient documentation

## 2018-04-21 DIAGNOSIS — L03116 Cellulitis of left lower limb: Secondary | ICD-10-CM | POA: Insufficient documentation

## 2018-04-21 DIAGNOSIS — Z79899 Other long term (current) drug therapy: Secondary | ICD-10-CM | POA: Insufficient documentation

## 2018-04-21 DIAGNOSIS — I10 Essential (primary) hypertension: Secondary | ICD-10-CM | POA: Insufficient documentation

## 2018-04-21 DIAGNOSIS — L03115 Cellulitis of right lower limb: Secondary | ICD-10-CM | POA: Diagnosis not present

## 2018-04-21 DIAGNOSIS — J9509 Other tracheostomy complication: Secondary | ICD-10-CM | POA: Insufficient documentation

## 2018-04-21 DIAGNOSIS — L03119 Cellulitis of unspecified part of limb: Secondary | ICD-10-CM

## 2018-04-21 MED ORDER — OXYCODONE-ACETAMINOPHEN 5-325 MG PO TABS: 1.0000 | ORAL_TABLET | Freq: Once | ORAL | Status: DC

## 2018-04-21 MED ORDER — CEPHALEXIN 250 MG/5ML PO SUSR: 500.0000 mg | Freq: Three times a day (TID) | ORAL | 0 refills | Status: AC

## 2018-04-21 MED ORDER — HYDROCODONE-ACETAMINOPHEN 7.5-325 MG/15ML PO SOLN
10.0000 mL | Freq: Once | ORAL | Status: AC
  Administered 2018-04-21: 10 mL via ORAL
  Filled 2018-04-21: qty 15

## 2018-04-21 NOTE — ED Provider Notes (Addendum)
[facility] DEPT Provider Note  CSN: [id number] Arrival date & time: 04/21/18 1746  Chief Complaint(s) Respiratory Distress  HPI Kyle Alexander is a 57 y.o. male with extensive past medical history listed below including throat cancer requiring trach on hospice who presents to the emergency department with complication with a trach and increased secretions.  Patient had difficulty breathing due to a clogged trach and had difficulty suctioning.  EMS was called and transported the patient to the emergency department.  No recent fevers.  No emesis.  No diarrhea.  No urinary symptoms.  Patient also has chronic bilateral lower extremity edema and associated wounds.  Patient and family are concerned with the frequency of hospice visitations at home.  HPI  Past Medical History Past Medical History:  Diagnosis Date  . Fatigue 03/28/2016  . GERD (gastroesophageal reflux disease)   . Heartburn symptom 10/19/2015  . Hypertension   . Insomnia 01/22/2016  . Nausea & vomiting 12/15/2015  . S/P radiation therapy 04/16/2015-05/18/2015   Laryngopharynx and bilateral neck / 50 Gy in 20 fractions to gross disease, 45 Gy in 20 fractions to high risk nodal echelons   . Seizures ([place])   . Shortness of breath dyspnea   . Syncopal episodes 04/25/2016  . Throat cancer ([place]) 03/16/2015   left cervical lymph node / throat (2016)   Patient Active Problem List   Diagnosis Date Noted  . Respiratory failure ([place]) 03/25/2018  . Acute on chronic respiratory failure with hypoxia ([place]) 03/24/2018  . PEG (percutaneous endoscopic gastrostomy) status ([place]) 03/24/2018  . SIRS (systemic inflammatory response syndrome) ([place]) 03/24/2018  . Goals of care, counseling/discussion 03/22/2018  . Other constipation 01/18/2018  . Bilateral leg edema 04/25/2017  . Dehydration 03/09/2017  . Abdominal pain 01/30/2017  . Weight loss 01/30/2017  . 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 DNR (do not resuscitate) 12/15/2016  . Acute bronchitis with chronic obstructive pulmonary disease (COPD) ([place]) 11/29/2016  . Acquired hypothyroidism 08/09/2016  . Back skin lesion 08/08/2016  . Syncopal episodes 04/25/2016  . Fatigue 03/28/2016  . 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 Cancer associated pain 02/19/2016  . Abscess of face 02/01/2016  . Insomnia 01/22/2016  . Pancytopenia due to antineoplastic chemotherapy ([place]) 01/22/2016  . Esophagitis, unspecified 01/22/2016  . Hemoptysis 01/22/2016  . Chemotherapy-induced nausea 01/01/2016  . Nausea & vomiting 12/15/2015  . Acneiform rash 12/11/2015  . Nasal drainage 10/30/2015  . Gastrostomy tube in place ([place]) 10/19/2015  . Heartburn symptom 10/19/2015  . Tracheostomy status ([place])   . Hypomagnesemia   . Tracheostomy care ([place])   . 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[place]) 03/26/2015  . Vitamin D deficiency 03/17/2015  . Folate deficiency 03/16/2015  . Protein-calorie malnutrition, severe ([place]) 03/16/2015  . HTN (hypertension) 03/15/2015  . Tobacco abuse 03/15/2015  . Alcohol withdrawal seizure ([place]) 03/15/2015  . Prolonged Q-T interval on ECG 03/15/2015  . Aortic dilatation ([place]) 03/15/2015  . Thyroid nodule 03/15/2015  . Lymphadenopathy 03/15/2015  . Hypokalemia 03/15/2015  . B12 deficiency 03/15/2015  . Marijuana abuse 03/15/2015   Home Medication(s) Prior to Admission medications   Medication Sig Start Date End Date Taking? Authorizing Provider  dexamethasone (DECADRON) 4 MG tablet TAKE 1 TABLET BY MOUTH DAILY. 02/06/18  Yes [name], MD  levothyroxine (SYNTHROID) 75 MCG tablet Take 1 tablet (75 mcg total) by mouth daily before breakfast. 03/08/18  Yes [name], MD  methadone (DOLOPHINE)  5 MG tablet Take 5 mg by mouth every 8 (eight) hours.  03/29/18  Yes [provider]  morphine (MSIR) 30 MG tablet Take 3 tablets (90 mg total) by mouth every 4  (four) hours as needed for severe pain. 04/09/18  Yes [name], MD  Nutritional Supplements (FEEDING SUPPLEMENT, JEVITY 1.5 CAL/FIBER,) LIQD Give 1 can Osmolite 1.2 + 1 can of Jevity 1.5 QID via PEG with 60 cc free water before and after bolus. Flush with 240 cc free water BID between feedings. 03/21/16  Yes [name], MD  omeprazole (PRILOSEC) 40 MG capsule Take 1 capsule (40 mg total) by mouth daily. 05/04/17  Yes Danis, Kirke Corin, MD  cephALEXin (KEFLEX) 250 MG/5ML suspension Take 10 mLs (500 mg total) by mouth 3 (three) times daily for 7 days. 04/21/18 04/28/18  Fatima Blank, MD  gabapentin (NEURONTIN) 300 MG capsule Place 1 capsule (300 mg total) 3 (three) times daily into feeding tube. Patient not taking: Reported on 03/25/2018 10/30/17   [name], MD  lactulose (CHRONULAC) 10 GM/15ML solution Take 10 g by mouth 2 (two) times daily as needed for mild constipation.  04/05/18   [provider]  morphine (MS CONTIN) 100 MG 12 hr tablet Take 1 tablet (100 mg total) by mouth every 8 (eight) hours. Patient not taking: Reported on 03/25/2018 01/18/18   [name], MD  ondansetron (ZOFRAN) 8 MG tablet TAKE 1 TABLET BY MOUTH EVERY 8 HOURS AS NEEDED FOR NAUSEA 08/24/17   [name], MD                                                                                                                                    Past Surgical History Past Surgical History:  Procedure Laterality Date  . DIRECT LARYNGOSCOPY WITH BOTOX INJECTION N/A 02/26/2016   Procedure: DIRECT LARYNGOSCOPY ESOPHAGOSCOPY DILATION  AND TRACH REPLACMENT;  Surgeon: [name], MD;  Location: [facility];  Service: ENT;  Laterality: N/A;  . ESOPHAGOSCOPY WITH DILITATION N/A 02/26/2016   Procedure: ESOPHAGOSCOPY WITH DILITATION, PHARNGEAL BIOPSY;  Surgeon: [name], MD;  Location: [facility];  Service: ENT;  Laterality: N/A;  . GASTROSTOMY N/A 03/31/2015   Procedure: OPEN GASTROSTOMY  TUBE PLACEMENT;  Surgeon: Rolm Bookbinder, MD;  Location: [facility];  Service: General;  Laterality: N/A;  . HERNIA REPAIR     right groin  . IR GENERIC HISTORICAL  08/09/2016   IR GASTRIC TUBE PERC CHG W/O IMG GUIDE 08/09/2016 Jacqulynn Cadet, MD [facility]-INTERV RAD  . IR GENERIC HISTORICAL  02/10/2017   IR [place] TUBE PERCUT W/FLUORO 02/10/2017 Sandi Mariscal, MD [facility]INTER[facility]  . IR PATIENT EVAL TECH 0-60 MINS  04/12/2017  . IR REPLACE G-TUBE SIMPLE WO FLUORO  04/16/2018  . IR [place] TUBE PERCUT W/FLUORO  10/30/2017  . KNEE SURGERY  1998   Left  . LYMPH NODE BIOPSY Left 03/16/15  left cervical, consistent with squamous cell carcinoma  . MULTIPLE EXTRACTIONS WITH ALVEOLOPLASTY N/A 03/26/2015   Procedure: EXTRACTION OF TOOTH #'S 1,2,5,6,7,8,9,10,11,12,13,18,19,21,22,23,24,25,26,27,28,29  WITH AVELOPLASTY;  Surgeon: Lenn Cal, DDS;  Location: [facility];  Service: Oral Surgery;  Laterality: N/A;  . TONSILLECTOMY    . TONSILLECTOMY AND ADENOIDECTOMY    . TOOTH EXTRACTION  03/26/2015   Procedure: [place];  Surgeon: [name], MD;  Location: [facility];  Service: ENT;;  . TRACHEOSTOMY TUBE PLACEMENT N/A 03/26/2015   Procedure: TRACHEOSTOMY ;  Surgeon: [name], MD;  Location[facility];  Service: ENT;  Laterality: N/A;   Family History Family History  Problem Relation Age of Onset  . Heart disease Mother   . Diabetes Father   . Hypertension Father     Social History Social History   Tobacco Use  . Smoking status: Former Smoker    Packs/day: 0.00    Years: 35.00    Pack years: 0.00    Types: Cigarettes  . Smokeless tobacco: Former Systems developer    Types: Chew  . Tobacco comment: 2-4 cig per day  Substance Use Topics  . Alcohol use: Yes    Alcohol/week: 18.0 oz    Types: 30 Standard drinks or equivalent per week    Comment: occasional  . Drug use: Yes    Types: Marijuana    Comment: occasional    Allergies Aspirin  Review of Systems Review of Systems All other systems are  reviewed and are negative for acute change except as noted in the HPI  Physical Exam Vital Signs  I have reviewed the triage vital signs BP 110/80 (BP Location: Right Arm)   Pulse 89   Temp 98.6 F (37 C) (Oral)   Resp 18   Ht 5\' 6"  (1.676 m)   Wt 66.2 kg (146 lb)   SpO2 100%   BMI 23.57 kg/m   Physical Exam  Constitutional: He is oriented to person, place, and time. He appears well-developed and well-nourished. No distress.  HENT:  Head: Normocephalic and atraumatic.    Right Ear: External ear normal.  Left Ear: External ear normal.  Nose: Nose normal.  Mouth/Throat: Mucous membranes are normal. No trismus in the jaw.  Eyes: Conjunctivae and EOM are normal. No scleral icterus.  Neck: Normal range of motion and phonation normal.    Cardiovascular: Normal rate and regular rhythm.  Pulmonary/Chest: Effort normal. No stridor. No respiratory distress.  Abdominal: He exhibits no distension.  Musculoskeletal: Normal range of motion. He exhibits no edema.  BLE edema with intact and ruptured blisters with surrounding erythema. TTP  Neurological: He is alert and oriented to person, place, and time.  Skin: He is not diaphoretic.  Psychiatric: He has a normal mood and affect. His behavior is normal.  Vitals reviewed.   ED Results and Treatments Labs (all labs ordered are listed, but only abnormal results are displayed) Labs Reviewed - No data to display  EKG  EKG Interpretation  Date/Time:    Ventricular Rate:    PR Interval:    QRS Duration:   QT Interval:    QTC Calculation:   R Axis:     Text Interpretation:        Radiology No results found. Pertinent labs & imaging results that were available during my care of the patient were reviewed by me and considered in my medical decision making (see chart for details).  Medications Ordered in  ED Medications  HYDROcodone-acetaminophen (HYCET) 7.5-325 mg/15 ml solution 10 mL (10 mLs Oral Given 04/21/18 2159)                                                                                                                                    Procedures Procedures  (including critical care time)  Medical Decision Making / ED Course I have reviewed the nursing notes for this encounter and the patient's prior records (if available in EHR or on provided paperwork).    Trach with mucus plug.  No respiratory distress.  Deep suctioning and trach inner catheter changed.  Lower extremities with evidence of cellulitis.  Patient was unable to getting antibiotics.  Hospice coordinator was contacted who spoke with the patient and his sister regarding home hospice. Family was adamant that pt follow up with Dr. [name]. Hospice coordinator provided them with instructions on how to go about doing so.   Remained HDS, wo respiratory distress.  The patient appears reasonably screened and/or stabilized for discharge and I doubt any other medical condition or other [facility] requiring further screening, evaluation, or treatment in the ED at this time prior to discharge.  The patient is safe for discharge with strict return precautions.   Final Clinical Impression(s) / ED Diagnoses Final diagnoses:  Other tracheostomy complication ([place])  Cellulitis of lower extremity, unspecified laterality   Disposition: Discharge  Condition: Good  I have discussed the results, Dx and Tx plan with the patient and family who expressed understanding and agree(s) with the plan. Discharge instructions discussed at great length. The patient and family was given strict return precautions who verbalized understanding of the instructions. No further questions at time of discharge.    ED Discharge Orders        Ordered    cephALEXin (KEFLEX) 250 MG/5ML suspension  3 times daily     04/21/18 2119       Follow Up: Clent Demark, PA-C [facility] [address] [phone]  Call  As needed  [name], MD [address] [place] [address] [phone]  Call  As needed      This chart was dictated using voice recognition software.  Despite best efforts to proofread,  errors can occur which can change the documentation meaning.   Addendum: Pt had difficulty breathing prior to leaving the emergency department and required frequent suctioning.  Patient scared  to go home.  We will monitor the patient overnight in the emergency department and reassess in the morning.  We will have hospice evaluate him here.   Fatima Blank, MD 04/22/18 [phone]

## 2018-04-21 NOTE — ED Notes (Signed)
[facility] contacted, hospice nurse will cal me back

## 2018-04-21 NOTE — ED Notes (Signed)
Bed: WA02 Expected date:  Expected time:  Means of arrival:  Comments: Difficulty breathing trach pt needs ? Deep suction

## 2018-04-21 NOTE — Progress Notes (Signed)
Spoke to patient regarding his trach site with redness and some red drainage noted.  I requested that patient allow home health/hospice to provide care for him as much as possible to keep the area from clogging off and from infection.  Patient stated that he was agreeable to additional help at home with hospice.

## 2018-04-21 NOTE — ED Triage Notes (Signed)
Pt to ED via [facility] with c/o of difficulty breathing, more so trach was full of gunk and not able to clear. Hospice nurse went out today and wasn't able to clear the trach either. Pt has prescription for Atropine drops but wasn't given any today. Pt fell asleep last night without his trach and Hospice nurse think the trach got full from this. Pt also has sores on his legs, that he is seeing wound care for that are wrap.  Pt also has throat cancer that has spread to left eye.  Pt is still a smoker.   EMS Vitals 103/46 110 18 RR 96% blow by O2

## 2018-04-21 NOTE — ED Notes (Signed)
Kyle Alexander called and was advised that pt is still on waiting list for transport back to facility. No units are available at this time.

## 2018-04-22 MED ORDER — LEVOTHYROXINE SODIUM 75 MCG PO TABS
75.0000 ug | ORAL_TABLET | Freq: Every day | ORAL | Status: DC
  Administered 2018-04-22: 75 ug via ORAL
  Filled 2018-04-22: qty 1

## 2018-04-22 MED ORDER — MORPHINE SULFATE 30 MG PO TABS
90.0000 mg | ORAL_TABLET | ORAL | Status: DC | PRN
  Administered 2018-04-22: 90 mg via ORAL
  Filled 2018-04-22: qty 3

## 2018-04-22 MED ORDER — DEXAMETHASONE 4 MG PO TABS
4.0000 mg | ORAL_TABLET | Freq: Every day | ORAL | Status: DC
  Administered 2018-04-22: 4 mg via ORAL
  Filled 2018-04-22: qty 1

## 2018-04-22 MED ORDER — ONDANSETRON HCL 4 MG PO TABS: 8.0000 mg | ORAL_TABLET | Freq: Three times a day (TID) | ORAL | Status: DC | PRN

## 2018-04-22 MED ORDER — LORAZEPAM 1 MG PO TABS: 1.0000 mg | ORAL_TABLET | Freq: Four times a day (QID) | ORAL | Status: DC | PRN

## 2018-04-22 MED ORDER — LORAZEPAM 1 MG PO TABS
1.0000 mg | ORAL_TABLET | Freq: Once | ORAL | Status: AC
  Administered 2018-04-22: 1 mg via ORAL
  Filled 2018-04-22: qty 1

## 2018-04-22 NOTE — Progress Notes (Addendum)
[facility])  [facility] room is available for Kyle Alexander today for continued symptom management. Met with him and his sister Kyle Alexander at bedside. Kyle Alexander signed transfer paper work and explained to Kyle Alexander that he is going to [facility] for continued symptom management. Kyle Alexander opened his right eye and nodded his head in agreement. Updated bedside RN and LCSW.   RN please call report to [phone].  Please use [facility] for transport to [facility].   Thank you,  Erling Conte, LCSW [phone]

## 2018-04-22 NOTE — ED Provider Notes (Signed)
Care assumed from Dr. Dallas Breeding -patient with tracheostomy and throat cancer under hospice care presented with need for frequent suctioning.  He is intermittently in mild respiratory distress, but is not becoming hypoxic.  His sister is uncomfortable with him going home.  He has been observed in the emergency department overnight.  He has been given morphine and lorazepam to assist with anxiety.  No point was he actually getting hypoxic.  Have discussed his case with Janett Billow, hospice nurse, and a hospice social worker will be coming to the ED to see if they can arrange to get him into [facility].  Case is signed out to Dr. Winfred Leeds at this point.   Delora Fuel, MD 83/77/93 [phone]

## 2018-04-22 NOTE — ED Notes (Signed)
Bed: UU82 Expected date: 04/22/18 Expected time:  Means of arrival:  Comments: For room 2

## 2018-04-22 NOTE — Progress Notes (Signed)
Called to room to suction patient which this writer did with little to no relief from distress, however, saturations are currently 100% on 28% CATC.  Sister of patient at bedside and is frustrated that this Probation officer is not eliminating patients distress.  Discussion held regarding the limitations of treatments second to hospice, per patients choice.  Discussed only thing this writer could do is mechanical ventilation which would eliminate hospice services.  Patient does not want this and he requested a sleeping pill.  This Probation officer spoke to MD and requested evaluation for medication if possible.

## 2018-04-23 ENCOUNTER — Telehealth: Payer: Self-pay | Admitting: *Deleted

## 2018-04-26 ENCOUNTER — Other Ambulatory Visit: Payer: Self-pay

## 2018-04-26 ENCOUNTER — Ambulatory Visit: Payer: Self-pay

## 2018-05-12 NOTE — Telephone Encounter (Signed)
Received a message stating patient died this morning at [facility]

## 2018-05-12 DEATH — deceased

## 2023-11-01 NOTE — Telephone Encounter (Signed)
Telephone call
